# Patient Record
Sex: Male | Born: 1965 | Race: White | Hispanic: Yes | Marital: Married | Smoking: Never smoker
Health system: Southern US, Community
[De-identification: ages and names within clinical notes are randomized; demographics above are authoritative.]

## PROBLEM LIST (undated history)

## (undated) DIAGNOSIS — T8859XA Other complications of anesthesia, initial encounter: Secondary | ICD-10-CM

## (undated) DIAGNOSIS — E119 Type 2 diabetes mellitus without complications: Secondary | ICD-10-CM

## (undated) DIAGNOSIS — G839 Paralytic syndrome, unspecified: Secondary | ICD-10-CM

## (undated) DIAGNOSIS — F32A Depression, unspecified: Secondary | ICD-10-CM

## (undated) DIAGNOSIS — F419 Anxiety disorder, unspecified: Secondary | ICD-10-CM

## (undated) DIAGNOSIS — R0600 Dyspnea, unspecified: Secondary | ICD-10-CM

## (undated) DIAGNOSIS — Z87442 Personal history of urinary calculi: Secondary | ICD-10-CM

## (undated) DIAGNOSIS — R519 Headache, unspecified: Secondary | ICD-10-CM

## (undated) DIAGNOSIS — K219 Gastro-esophageal reflux disease without esophagitis: Secondary | ICD-10-CM

## (undated) DIAGNOSIS — Z8781 Personal history of (healed) traumatic fracture: Secondary | ICD-10-CM

## (undated) DIAGNOSIS — I10 Essential (primary) hypertension: Secondary | ICD-10-CM

## (undated) HISTORY — DX: Personal history of (healed) traumatic fracture: Z87.81

## (undated) HISTORY — PX: CERVICAL SPINE SURGERY: SHX589

---

## 1997-11-24 ENCOUNTER — Emergency Department (HOSPITAL_COMMUNITY): Admission: EM | Admit: 1997-11-24 | Discharge: 1997-11-24 | Payer: Self-pay | Admitting: Emergency Medicine

## 1997-11-29 ENCOUNTER — Emergency Department (HOSPITAL_COMMUNITY): Admission: EM | Admit: 1997-11-29 | Discharge: 1997-11-29 | Payer: Self-pay | Admitting: Emergency Medicine

## 1998-01-23 ENCOUNTER — Encounter: Payer: Self-pay | Admitting: *Deleted

## 1998-01-23 ENCOUNTER — Emergency Department (HOSPITAL_COMMUNITY): Admission: EM | Admit: 1998-01-23 | Discharge: 1998-01-23 | Payer: Self-pay | Admitting: *Deleted

## 1999-11-10 ENCOUNTER — Encounter: Payer: Self-pay | Admitting: Emergency Medicine

## 1999-11-10 ENCOUNTER — Inpatient Hospital Stay (HOSPITAL_COMMUNITY): Admission: EM | Admit: 1999-11-10 | Discharge: 1999-11-11 | Payer: Self-pay | Admitting: Emergency Medicine

## 2004-10-04 ENCOUNTER — Inpatient Hospital Stay (HOSPITAL_COMMUNITY): Admission: EM | Admit: 2004-10-04 | Discharge: 2004-10-08 | Payer: Self-pay | Admitting: Emergency Medicine

## 2005-01-25 ENCOUNTER — Emergency Department (HOSPITAL_COMMUNITY): Admission: EM | Admit: 2005-01-25 | Discharge: 2005-01-26 | Payer: Self-pay | Admitting: Emergency Medicine

## 2008-01-22 ENCOUNTER — Emergency Department (HOSPITAL_COMMUNITY): Admission: EM | Admit: 2008-01-22 | Discharge: 2008-01-22 | Payer: Self-pay | Admitting: Family Medicine

## 2009-01-08 ENCOUNTER — Emergency Department (HOSPITAL_COMMUNITY): Admission: EM | Admit: 2009-01-08 | Discharge: 2009-01-08 | Payer: Self-pay | Admitting: Emergency Medicine

## 2010-05-16 LAB — DIFFERENTIAL
Lymphocytes Relative: 18 % (ref 12–46)
Monocytes Absolute: 0.5 10*3/uL (ref 0.1–1.0)
Monocytes Relative: 7 % (ref 3–12)
Neutro Abs: 4.9 10*3/uL (ref 1.7–7.7)

## 2010-05-16 LAB — CBC
Hemoglobin: 15.8 g/dL (ref 13.0–17.0)
RBC: 5.1 MIL/uL (ref 4.22–5.81)

## 2010-05-16 LAB — BASIC METABOLIC PANEL
Calcium: 9.2 mg/dL (ref 8.4–10.5)
GFR calc Af Amer: >60 mL/min (ref >60)
GFR calc non Af Amer: >60 mL/min (ref >60)
Sodium: 139 mEq/L (ref 135–145)
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
## 2010-06-29 NOTE — Discharge Summary (Signed)
NAMEIZIC, STFORT NO.:  [id number]   MEDICAL RECORD NO.:  [id number]          PATIENT TYPE:  INP   LOCATION:  5702                         FACILITY:  [facility]   PHYSICIAN:  Cherylynn Ridges, M.D.    DATE OF BIRTH:  Sep 03, 1965   DATE OF ADMISSION:  10/04/2004  DATE OF DISCHARGE:                                 DISCHARGE SUMMARY   DISCHARGE DIAGNOSES:  1.  Fall.  2.  Grade 3 splenic graft laceration.  3.  Left renal contusion.  4.  Atelectasis.   CONSULTANTS:  None.   PROCEDURES:  None.   HISTORY OF PRESENT ILLNESS:  This is a 45 year old Hispanic male who fell  approximately 8 feet onto was left side while working in [employer]. He  comes in as a non-trauma code complaining mainly of left lateral  abdominal/flank pain but also complaining of some left foot shoulder and  shoulder pain as well as a right muscular neck pain. Patient's workup  included extremity films of the left shoulder and foot which were negative.  He also had a CT of the abdomen and pelvis which showed a grade 3 splenic  laceration and a small left renal contusion. He was admitted for strict  bedrest and observation.   HOSPITAL COURSE:  The patient did well in the hospital. He was on strict  bedrest for 3 days and then ambulated on the fourth and fifth days and did  that well. His hemoglobin trended upward which was a reassuring sign. He  continued to have adequate kidney function and so his was renal contusion  was not worrisome. He was felt able to go home in good condition on hospital  day #5.   DISCHARGE MEDICATIONS:  Vicodin 5/500 1-2 p.o. q.6 h p.r.n. pain,  #50 with  no refill.   FOLLOW UP:  The patient is to follow up with trauma service clinic on  September 5 at 9:30 in the morning.      Earney Hamburg, P.A.      Cherylynn Ridges, M.D.  Electronically Signed   MJ/MEDQ  D:  10/08/2004  T:  10/08/2004  Job:  [id number]   cc:   [facility]

## 2015-04-24 ENCOUNTER — Encounter (HOSPITAL_COMMUNITY): Payer: Self-pay | Admitting: Emergency Medicine

## 2015-04-24 ENCOUNTER — Emergency Department (HOSPITAL_COMMUNITY)
Admission: EM | Admit: 2015-04-24 | Discharge: 2015-04-24 | Disposition: A | Discharge status: Home / Self Care | Payer: Medicaid Other | Attending: Emergency Medicine | Admitting: Emergency Medicine

## 2015-04-24 ENCOUNTER — Emergency Department (HOSPITAL_COMMUNITY): Payer: Medicaid Other

## 2015-04-24 DIAGNOSIS — R110 Nausea: Secondary | ICD-10-CM | POA: Diagnosis not present

## 2015-04-24 DIAGNOSIS — E1165 Type 2 diabetes mellitus with hyperglycemia: Secondary | ICD-10-CM | POA: Insufficient documentation

## 2015-04-24 DIAGNOSIS — R42 Dizziness and giddiness: Secondary | ICD-10-CM | POA: Diagnosis present

## 2015-04-24 HISTORY — DX: Type 2 diabetes mellitus without complications: E11.9

## 2015-04-24 LAB — BASIC METABOLIC PANEL
Anion gap: 9 (ref 5–15)
BUN: 7 mg/dL (ref 6–20)
CALCIUM: 9 mg/dL (ref 8.9–10.3)
CHLORIDE: 107 mmol/L (ref 101–111)
CO2: 22 mmol/L (ref 22–32)
CREATININE: 0.74 mg/dL (ref 0.61–1.24)
GFR calc Af Amer: >60 mL/min (ref >60)
GFR calc non Af Amer: >60 mL/min (ref >60)
Glucose, Bld: 171 mg/dL — ABNORMAL HIGH (ref 65–99)
POTASSIUM: 3.7 mmol/L (ref 3.5–5.1)
SODIUM: 138 mmol/L (ref 135–145)

## 2015-04-24 LAB — CBC
HCT: 46.6 % (ref 39.0–52.0)
Hemoglobin: 15.7 g/dL (ref 13.0–17.0)
MCH: 30.1 pg (ref 26.0–34.0)
MCHC: 33.7 g/dL (ref 30.0–36.0)
MCV: 89.3 fL (ref 78.0–100.0)
PLATELETS: 155 10*3/uL (ref 150–400)
RBC: 5.22 MIL/uL (ref 4.22–5.81)
RDW: 12.5 % (ref 11.5–15.5)
WBC: 5.2 10*3/uL (ref 4.0–10.5)

## 2015-04-24 LAB — CBG MONITORING, ED: GLUCOSE-CAPILLARY: 117 mg/dL — AB (ref 65–99)

## 2015-04-24 MED ORDER — MECLIZINE HCL 25 MG PO TABS: 25.0000 mg | ORAL_TABLET | Freq: Three times a day (TID) | ORAL | Status: DC | PRN

## 2015-04-24 MED ORDER — LORAZEPAM 2 MG/ML IJ SOLN
1.0000 mg | Freq: Once | INTRAMUSCULAR | Status: AC
  Administered 2015-04-24: 1 mg via INTRAVENOUS
  Filled 2015-04-24: qty 1

## 2015-04-24 NOTE — ED Provider Notes (Addendum)
CSN: [id number]     Arrival date & time 04/24/15  0901 History   First MD Initiated Contact with Patient 04/24/15 1003     Chief Complaint  Patient presents with  . Dizziness     (Consider location/radiation/quality/duration/timing/severity/associated sxs/prior Treatment) HPI She speaks no [place]. History is obtained from language line on using [place] interpreter. Complains of dizziness i.e. sensation of room spinning onset yesterday. Denies pain anywhere. Patient had cough and chills yesterday which have resolved. Dizziness is worse with opening his eyes and moving his head improved with closing his eyes remaining still. Says his symptoms include nausea. No vomiting. No other associated symptoms no treatment prior to coming here. Past Medical History  Diagnosis Date  . Diabetes mellitus without complication (HCC)    History reviewed. No pertinent past surgical history. No family history on file. Social History  Substance Use Topics  . Smoking status: Never Smoker   . Smokeless tobacco: None  . Alcohol Use: Yes    Review of Systems  Skin: Positive for wound.       Draining wound on right buttock for several years previously surgerized 3 or 4 years ago at [facility]  Allergic/Immunologic: Positive for immunocompromised state.       Diabetic  Neurological: Positive for dizziness.  All other systems reviewed and are negative.     Allergies  Review of patient's allergies indicates no known allergies.  Home Medications   Prior to Admission medications   Not on File   BP 129/82 mmHg  Pulse 67  Temp(Src) 97.6 F (36.4 C) (Oral)  Resp 16  SpO2 99% Physical Exam  Constitutional: He is oriented to person, place, and time. He appears well-developed and well-nourished.  HENT:  Head: Normocephalic and atraumatic.  Eyes: Conjunctivae are normal. Pupils are equal, round, and reactive to light.  Neck: Neck supple. No tracheal deviation present. No thyromegaly present.   Cardiovascular: Normal rate and regular rhythm.   No murmur heard. Pulmonary/Chest: Effort normal and breath sounds normal.  Abdominal: Soft. Bowel sounds are normal. He exhibits no distension. There is no tenderness.  Musculoskeletal: Normal range of motion. He exhibits no edema or tenderness.  Neurological: He is alert and oriented to person, place, and time. He has normal reflexes. No cranial nerve deficit. Coordination normal.  Skin: Skin is warm and dry. No rash noted.  Right buttock with scar tissue dime sized area no drainage. No redness. No fluctuance  Psychiatric: He has a normal mood and affect.  Nursing note and vitals reviewed.   ED Course  Procedures (including critical care time) Labs Review Labs Reviewed  BASIC METABOLIC PANEL - Abnormal; Notable for the following:    Glucose, Bld 171 (*)    All other components within normal limits  CBC  URINALYSIS, ROUTINE W REFLEX MICROSCOPIC (NOT AT [facility])  CBG MONITORING, ED    Imaging Review No results found. I have personally reviewed and evaluated these images and lab results as part of my medical decision-making.   EKG Interpretation None      Results for orders placed or performed during the hospital encounter of 04/24/15  Basic metabolic panel  Result Value Ref Range   Sodium 138 135 - 145 mmol/L   Potassium 3.7 3.5 - 5.1 mmol/L   Chloride 107 101 - 111 mmol/L   CO2 22 22 - 32 mmol/L   Glucose, Bld 171 (H) 65 - 99 mg/dL   BUN 7 6 - 20 mg/dL   Creatinine, Ser  0.74 0.61 - 1.24 mg/dL   Calcium 9.0 8.9 - 16.110.3 mg/dL   GFR calc non Af Amer >60 >60 mL/min   GFR calc Af Amer >60 >60 mL/min   Anion gap 9 5 - 15  CBC  Result Value Ref Range   WBC 5.2 4.0 - 10.5 K/uL   RBC 5.22 4.22 - 5.81 MIL/uL   Hemoglobin 15.7 13.0 - 17.0 g/dL   HCT 09.646.6 04.539.0 - 40.952.0 %   MCV 89.3 78.0 - 100.0 fL   MCH 30.1 26.0 - 34.0 pg   MCHC 33.7 30.0 - 36.0 g/dL   RDW 81.112.5 91.411.5 - 78.215.5 %   Platelets 155 150 - 400 K/uL  CBG monitoring, ED   Result Value Ref Range   Glucose-Capillary 117 (H) 65 - 99 mg/dL   Mr Brain Wo Contrast  04/24/2015  CLINICAL DATA:  Dizziness and nausea beginning today. EXAM: MRI HEAD WITHOUT CONTRAST TECHNIQUE: Multiplanar, multiecho pulse sequences of the brain and surrounding structures were obtained without intravenous contrast. COMPARISON:  Head CT 01/25/2005 FINDINGS: The brain has a normal appearance on all pulse sequences without evidence of malformation, atrophy, old or acute infarction, mass lesion, hemorrhage, hydrocephalus or extra-axial collection. No pituitary mass. No fluid in the sinuses, middle ears or mastoids. No skull or skullbase lesion. There is flow in the major vessels at the base of the brain. Major venous sinuses show flow. IMPRESSION: Normal examination.  No cause of dizziness identified. Electronically Signed   By: Paulina FusiMark  Shogry M.D.   On: 04/24/2015 13:23    2:20 PM patient feels improved after treatment with intervenous Ativan. Vertigo is almost gone. He ambulates without difficulty. MDM  Vertigo felt to be peripheral in etiology plan prescription meclizine. 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

## 2015-04-24 NOTE — ED Notes (Signed)
EKG completed in triage.

## 2015-04-24 NOTE — ED Notes (Signed)
Doctor at bedside using language line.

## 2015-04-24 NOTE — Discharge Instructions (Signed)
Vrtigo posicional benigno (Benign Positional Vertigo) Call [facility] [place] [facility] scheduled office visit regarding the wound on your. Take the medication prescribed as needed for dizziness. See your primary care physician if dizziness continues in for 5 days. Return if your condition worsens for any reason El vrtigo es la sensacin de que usted o todo lo que lo rodea se mueven cuando en realidad eso no sucede. El vrtigo posicional benigno es el tipo de vrtigo ms comn. La causa de este trastorno no es grave (es benigna). Algunos movimientos y determinadas posiciones pueden desencadenar el trastorno (es posicional). El vrtigo posicional benigno puede ser peligroso si ocurre mientras est haciendo algo que podra suponer un riesgo para usted y para los dems, por ejemplo, conduciendo un automvil.  CAUSAS En muchos de los [place], se desconoce la causa de este trastorno. Puede deberse a Hotel manager zona del odo interno que ayuda al cerebro a percibir el movimiento y a Administrator, Civil Service equilibrio. Esta alteracin puede deberse a una infeccin viral (laberintitis), a una lesin en la cabeza o a los movimientos reiterados. FACTORES DE RIESGO Es ms probable que esta afeccin se manifieste en:  Las mujeres.  Las [employer] de [id number]. SNTOMAS Generalmente, los sntomas de este trastorno se presentan al mover la cabeza o los ojos en diferentes direcciones. Pueden aparecer repentinamente y suelen durar menos de un minuto. Entre los sntomas se pueden incluir los siguientes:  Prdida del equilibrio y cadas.  Sensacin de estar dando vueltas o movindose.  Sensacin de que el entorno est dando vueltas o movindose.  Nuseas y vmitos.  Visin borrosa.  Mareos.  Movimientos oculares involuntarios (nistagmo). Los sntomas pueden ser leves y algo fastidiosos, o pueden ser graves e interferir en la vida cotidiana. Los episodios de vrtigo posicional benigno pueden repetirse (ser  recurrentes) a lo largo del [place], y algunos movimientos pueden desencadenarlos. Los sntomas pueden mejorar con Museum/gallery conservator. DIAGNSTICO Generalmente, este trastorno se diagnostica con una historia clnica y un examen fsico de la cabeza, el cuello y los odos. Tal vez lo deriven a Catering manager en problemas de la garganta, la nariz y el odo (otorrinolaringlogo), o a uno que se especializa en trastornos del sistema nervioso (neurlogo). Pueden hacerle otros estudios, entre ellos:  Health visitor.  Tomografa computarizada.  Estudios de los [employer]. El mdico puede pedirle que cambie rpidamente de posicin mientras observa si se presentan sntomas de vrtigo posicional benigno, por ejemplo, nistagmo. Los movimientos oculares se pueden estudiar con una electronistagmografa (ENG), con estimulacin trmica, mediante la maniobra de [place] o con la prueba de rotacin.  Electroencefalograma (EEG). Este estudio registra la actividad elctrica del cerebro.  Pruebas de audicin. Lissa Morales, para tratar este trastorno, el mdico le har movimientos especficos con la cabeza para que el odo interno se normalice. [employer] casos son graves, tal vez haya que realizar una ciruga, pero esto no es frecuente. En algunos casos, el vrtigo posicional benigno se resuelve por s solo en el trmino de 2 o 4semanas. INSTRUCCIONES PARA EL CUIDADO EN EL HOGAR Seguridad  Muvase lentamente.No haga movimientos bruscos con el cuerpo o con la cabeza.  No conduzca.  No opere maquinaria pesada.  No haga ninguna tarea que podra ser peligrosa para usted o para Economist en caso de que ocurriera un episodio de vrtigo.  Si tiene dificultad para caminar o mantener el equilibrio, use un bastn para Photographer estabilidad. Si se siente mareado o inestable, sintese de inmediato.  Reanude sus actividades normales como se lo haya indicado el mdico. Pregntele al  mdico qu actividades son seguras para usted. Instrucciones generales  Baxter Internationalome los medicamentos de venta libre y los recetados solamente como se lo haya indicado el mdico.  Evite algunas posiciones o determinados movimientos como se lo haya indicado el mdico.  Beba suficiente lquido para Pharmacologistmantener la orina clara o de color amarillo plido.  Concurra a todas las visitas de control como se lo haya indicado el mdico. Esto es importante. SOLICITE ATENCIN MDICA SI:  Lance Mussiene fiebre.  El trastorno Greeneempeora, o le aparecen sntomas nuevos.  Sus familiares o amigos advierten cambios en su comportamiento.  Las nuseas o los vmitos empeoran.  Tiene sensacin de hormigueo o de adormecimiento. SOLICITE ATENCIN MDICA DE INMEDIATO SI:  Tiene dificultad para hablar o para moverse.  Esta mareado todo Allied Waste Industriesel tiempo.  Se desmaya.  Tiene dolores de cabeza intensos.  Tiene debilidad en los brazos o las piernas.  Tiene cambios en la audicin o la visin.  Siente rigidez en el cuello.  Tiene sensibilidad a Statisticianla luz.   Esta informacin no tiene Theme park managercomo fin reemplazar el consejo del mdico. Asegrese de hacerle al mdico cualquier pregunta que tenga.   Document Released: 05/16/2008 Document Revised: 10/19/2014 Elsevier Interactive Patient Education [employer]2016 Elsevier Inc.

## 2015-04-24 NOTE — ED Notes (Signed)
Pt and wife verbalize understanding of instructions.

## 2015-04-24 NOTE — ED Notes (Signed)
Used language line to communicate with patient.  

## 2015-04-24 NOTE — ED Notes (Signed)
Translator phone used...the patient c/o not feeling well yesterday, pt woke up today feeling dizzy and having nausea.

## 2017-11-17 ENCOUNTER — Ambulatory Visit: Payer: Self-pay | Attending: Family Medicine | Admitting: Podiatry

## 2017-11-17 DIAGNOSIS — B351 Tinea unguium: Secondary | ICD-10-CM

## 2017-11-17 MED ORDER — FLUCONAZOLE 150 MG PO TABS: 150.0000 mg | ORAL_TABLET | ORAL | 1 refills | Status: DC

## 2017-11-17 NOTE — Progress Notes (Signed)
  Subjective:  Patient ID: [name], male    DOB: 04-Apr-1965,  MRN: [id number]  No chief complaint on file.   52 y.o. male presents with the above complaint. DM but doesn't check his sugars. Reports thickening and pain to the toenails of both feet.   Review of Systems: Negative except as noted in the HPI. Denies N/V/F/Ch.  Past Medical History:  Diagnosis Date  . Diabetes mellitus without complication (HCC)     Current Outpatient Medications:  .  fluconazole (DIFLUCAN) 150 MG tablet, Take 1 tablet (150 mg total) by mouth once a week., Disp: 6 tablet, Rfl: 1 .  gemfibrozil (LOPID) 600 MG tablet, Take 600 mg by mouth 2 (two) times daily., Disp: , Rfl: 1 .  ibuprofen (ADVIL,MOTRIN) 200 MG tablet, Take 200 mg by mouth every 6 (six) hours as needed for moderate pain., Disp: , Rfl:  .  meclizine (ANTIVERT) 25 MG tablet, Take 1 tablet (25 mg total) by mouth 3 (three) times daily as needed for dizziness., Disp: 20 tablet, Rfl: 0 .  metFORMIN (GLUCOPHAGE-XR) 500 MG 24 hr tablet, Take 1,000 mg by mouth every evening. , Disp: , Rfl: 0  Social History   Tobacco Use  Smoking Status Never Smoker    No Known Allergies Objective:  There were no vitals filed for this visit. There is no height or weight on file to calculate BMI. Constitutional Well developed. Well nourished.  Vascular Dorsalis pedis pulses palpable bilaterally. Posterior tibial pulses palpable bilaterally. Capillary refill normal to all digits.  No cyanosis or clubbing noted. Pedal hair growth normal.  Neurologic Normal speech. Oriented to person, place, and time. Epicritic sensation to light touch grossly present bilaterally.  Dermatologic Nails thickened and dystrophic. No open wounds. No skin lesions.  Orthopedic: Normal joint ROM without pain or crepitus bilaterally. No visible deformities. No bony tenderness.   Radiographs: None Assessment:   1. Onychomycosis    Plan:  Patient was evaluated and  treated and all questions answered.  Onychomycosis -Educated on etiology of nail fungus. -Rx fluconazole weekly. Educated on use.   Return if symptoms worsen or fail to improve.

## 2018-01-09 ENCOUNTER — Other Ambulatory Visit: Payer: Self-pay | Admitting: Podiatry

## 2018-10-08 ENCOUNTER — Ambulatory Visit: Payer: Medicaid Other | Admitting: Occupational Therapy

## 2018-10-08 ENCOUNTER — Other Ambulatory Visit: Payer: Self-pay

## 2018-10-08 ENCOUNTER — Ambulatory Visit: Payer: Medicaid Other | Attending: Physical Medicine and Rehabilitation | Admitting: Physical Therapy

## 2018-10-08 VITALS — BP 109/64 | HR 63

## 2018-10-08 DIAGNOSIS — G8221 Paraplegia, complete: Secondary | ICD-10-CM | POA: Diagnosis not present

## 2018-10-08 DIAGNOSIS — R208 Other disturbances of skin sensation: Secondary | ICD-10-CM | POA: Insufficient documentation

## 2018-10-08 DIAGNOSIS — M6281 Muscle weakness (generalized): Secondary | ICD-10-CM | POA: Diagnosis present

## 2018-10-08 DIAGNOSIS — M542 Cervicalgia: Secondary | ICD-10-CM | POA: Diagnosis present

## 2018-10-08 DIAGNOSIS — R29818 Other symptoms and signs involving the nervous system: Secondary | ICD-10-CM | POA: Diagnosis present

## 2018-10-08 DIAGNOSIS — R293 Abnormal posture: Secondary | ICD-10-CM | POA: Insufficient documentation

## 2018-10-08 DIAGNOSIS — G8222 Paraplegia, incomplete: Secondary | ICD-10-CM | POA: Insufficient documentation

## 2018-10-08 NOTE — Therapy (Signed)
[facility] [address] [place], [place], [address] Phone: [phone]   Fax:  [phone]  Physical Therapy Evaluation  Patient Details  Name: [name] MRN: [id number] Date of Birth: 01/25/1966 Referring Provider (PT): Harry CrampPatrick O'Brien, MD   Encounter Date: 10/08/2018  PT End of Session - 10/08/18 1248    Visit Number  1    Number of Visits  4   eval + [facility]   Authorization Type  Medicaid - pt has OT eval pending for September 17th    PT Start Time  0845    PT Stop Time  0945    PT Time Calculation (min)  60 min    Activity Tolerance  Patient tolerated treatment well    Behavior During Therapy  [facility] for tasks assessed/performed       Past Medical History:  Diagnosis Date  . Diabetes mellitus without complication (HCC)     No past surgical history on file.  Vitals:   10/08/18 0924  BP: 109/64  Pulse: 63     Subjective Assessment - 10/08/18 0856    Subjective  Pt is a 53 year old male with history of T2DM who [facility] as a trauma 1 via EMS s/p fall from elevated surface. 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 No LOC. Patient immediately noted back pain and bilateral lower extremity paralysis. He was found to have C1 burst fx with [place] transection s/p C4-T6 spinal fusion (6/10), incomplete para deficits. [facility]. At inpatient rehab they were working on his sitting balance, bicycle exercises, and working so my wife can get me in and out of bed. Pt's wife uses lift to get pt from w/c <> bed. Pt has hospital bed at home. [place] time [place] in [place] he was able to feel his wife touch at the bottom of his foot when putting socks on, but that was only once. Currently using a loaner chair - through [employer], not sure if his final w/c will have a seat  elevator.    Patient is accompained by:  Family member;Interpreter   virtual interpreter, wife [name]   Pertinent History  C1 burst fx with [place] transection s/p C4-T6 spinal fusion (6/10), incomplete para deficits, previous episodes of autonomic dysreflexia in inpatient rehab    Patient Stated Goals  wants to do exercises as soon as he can, wants to be able to feel his legs again if he can.    Currently in Pain?  Yes    Pain Score  7    when taking pain meds, it is a 5, if he doesn't its a 7, worse when moving   Pain Location  Shoulder    Pain Orientation  Right         [facility] PT Assessment - 10/08/18 0905      Assessment   Medical Diagnosis  SCI    Referring Provider (PT)  Harry CrampPatrick O'Brien, MD    Onset Date/Surgical Date  07/22/18    Prior Therapy  PT, OT, SLP [facility]      Precautions   Precautions  Fall    Precaution Comments  Has an abdominal binder at home for BP management, has not been wearing it due to fear of pressure sores. Figure 8 wraps for BP when OOB. autonomic dysreflexia (previous  episodes at inpatient rehab in [facility]).       Restrictions   Other Position/Activity Restrictions  WBAT R elbow-  R Velcro wrist splint.  aspen collar D/C at [facility]      Home Nurse, mental healthnvironment   Living Environment  Private residence    Living Arrangements  Spouse/significant other   and 4 children    Available Help at Discharge  Family   only his wife and 2 other kids   Type of Home  House    Home Access  Ramped entrance    Home Layout  One level    Home Equipment  Hospital bed   power w/c, lift for transfers   Additional Comments  Had to remodel home and bathoom, hospital was going to provide a roll-in shower chair, was waiting for a donation - currently getting bathed by wife in bed       Prior Function   Level of Independence  Independent    Vocation  On disability    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      Cognition   Overall Cognitive Status  Within  Functional Limits for tasks assessed      Observation/Other Assessments   Observations  Pt states that he is independent in performing pressure relief every 30 minutes for at least 2 minutes - using tilt in space power w/c      Sensation   Light Touch  Impaired Detail    Light Touch Impaired Details  Absent RLE;Absent LLE    Proprioception  Impaired Detail    Proprioception Impaired Details  Absent RLE;Absent LLE   ankle DF   Additional Comments  absent below T1 dermatome to light touch ([facility]) - only LE tested today      Coordination   Gross Motor Movements are Fluid and Coordinated  --    Fine Motor Movements are Fluid and Coordinated  --      Posture/Postural Control   Posture/Postural Control  Postural limitations    Postural Limitations  Flexed trunk;Posterior pelvic tilt;Forward head;Rounded Shoulders      ROM / Strength   AROM / PROM / Strength  Strength      Strength   Overall Strength Comments  no active movement noted BLEs, 0/5      Palpation   Palpation comment  TTP R > L shoulder musculature, anterior chest, accesssory muscles for breathing      Bed Mobility   Bed Mobility  Not assessed   due to time contraints     Transfers   Transfers  Lateral/Scoot Transfers    Lateral/Scoot Transfers  1: +2 Total assist    Lateral/Scoot Transfer Details (indicate cue type and reason)  with sliding board from w/c <. tx mat table, (surface elevated for gravity to assist), cueing for technique, sequencing, and shifting COG anteriorly.       Balance   Balance Assessed  Yes      Static Sitting Balance   Static Sitting - Balance Support  Feet supported   trunk supported, max A from therapist posteriorly   Static Sitting - Level of Assistance  2: Max assist      Dynamic Sitting Balance   Dynamic Sitting - Balance Support  Feet supported    Dynamic Sitting - Level of Assistance  2: Max assist    Dynamic Sitting - Balance Activities   Forward lean/weight shifting    Dynamic Sitting balance - Comments  In short sitting with therapist posterior, pt practicing anterior weight shifting with LUE (posteriorly with shoulder extension, ER), RUE on wedge on R with pillow d/t weight bearing precautions, cueing for shifting weight A/P for using head to assist. With therapist posterior to pt, providing cueing for diaphragmatic breathing and max A for upright trunk to stretch anterior chest and facilitate upright posture for increased ease of breathing and decrease use of accessory muscles for breathing - x5 reps.  Taught wife and pt how to perform at home. Total time in static/dynamic short sitting: approx. 10 minutes.                 Objective measurements completed on examination: See above findings.              PT Education - 10/08/18 1247    Education Details  POC, clinical findings, educated spouse on diaphragmatic breathing technique (sitting posteriorly behind pt with hands on chest, facilitating upright posture)    Person(s) Educated  Patient;Spouse    Methods  Explanation;Demonstration    Comprehension  Verbalized understanding       PT Short Term Goals - 10/08/18 1525      PT SHORT TERM GOAL #1   Title  Patient will direct initial HEP independently to wife/caregivers for stretching LEs in order to prevent risk of contractures. ALL STGS DUE BY 3RD VISIT    Baseline  dependent    Time  3    Period  --   visits   Status  New      PT SHORT TERM GOAL #2   Title  Patient will perform sitting balance with A/P weight shifting with mod A in order to prepare for sliding board lateral transfers from mat <> w/c.    Baseline  max A needed posteriorly in order for pt to remain balanced in short sitting with feet supported on block.    Status  New      PT SHORT TERM GOAL #3   Title  Patient will perform rolling right and left in bed with mod A in order to decrease caregiver burden and for pressure relief.     Baseline  not yet assessed    Status  New      PT SHORT TERM GOAL #4   Title  Patient will perform scoot pivot t/f on sliding board from w/c <> mat table (from one surface elevated with gravity assisting) with max A.    Baseline  total A x2 on 10/08/18    Status  New        PT Long Term Goals - 10/08/18 1531      PT LONG TERM GOAL #1   Title  Patient will direct final HEP independently to wife/caregivers for stretching LEs in order to prevent risk of contractures. ALL LTGS DUE BY 12TH VISIT    Baseline  dependent    Time  12    Period  --   visits   Status  New      PT LONG TERM GOAL #2   Title  When pt receives his own w/c (vs. loaner chair) pt will demonstrate independence with w/c parts in order to safely prepare for transfers.    Baseline  not yet assessed    Status  New      PT LONG TERM GOAL #3   Title  Patient will perform sitting balance with A/P and M/L weight shifting with supervision/min A in order to perform sliding board  lateral transfers from mat <> w/c.    Baseline  currently max A for dynamic sitting balance    Status  New      PT LONG TERM GOAL #4   Title  Patient will perform rolling right and left in bed with min A in order to decrease caregiver burden and for pressure relief.    Baseline  not yet assessed    Status  New      PT LONG TERM GOAL #5   Title  Patient will perform scoot pivot t/f on sliding board from w/c <> mat table on level surface with min-mod A.    Baseline  total A x2 with gravity assisting transfer    Status  New             Plan - 10/08/18 1249    Clinical Impression Statement  Patient is a 53 year old male referred to Neuro OPPT for evaluation s/p  s/p fall from elevated surface, resulting in C1 burst fx with [place] transection s/p C4-T6 spinal fusion (6/10), incomplete para deficits.  Pt's PMH is significant for: T2DM.  The following deficits were present during the exam: decreased static/dynamic sitting balance and trunk control,  decreased ROM, impaired sensation, impaired strength, requiring total A for sliding board transfers. Pt would benefit from skilled PT to address these impairments and functional limitations to maximize functional mobility independence.    Personal Factors and Comorbidities  Past/Current Experience;Profession;Comorbidity 1;Finances    Comorbidities  T2DM    Examination-Activity Limitations  Bathing;Bed Mobility;Stand;Sit;Transfers    Examination-Participation Restrictions  Community Activity;Driving    Stability/Clinical Decision Making  Unstable/Unpredictable    Clinical Decision Making  High    Rehab Potential  Good    PT Frequency  1x / week    PT Duration  3 weeks   initially for medicaid   PT Treatment/Interventions  ADLs/Self Care Home Management;DME Instruction;Functional mobility training;Therapeutic activities;Therapeutic exercise;Balance training;Neuromuscular re-education;Wheelchair mobility training;Patient/family education;Passive range of motion;Energy conservation;Electrical Stimulation;Orthotic Fit/Training;Manual techniques    PT Next Visit Plan  check neck ROM, assess bed mobility when able, follow up with a physiatrist here in town? (Dr. Tessa Lerner). Sitting balance A/P and M/L with BLE on step, teaching wife initial HEP for home stretching program to decrease risk of contractures.    Consulted and Agree with Plan of Care  Patient;Family member/caregiver    Family Member Consulted  wife, [name]       Patient will benefit from skilled therapeutic intervention in order to improve the following deficits and impairments:  Decreased activity tolerance, Decreased balance, Decreased mobility, Decreased endurance, Decreased range of motion, Decreased strength, Impaired flexibility, Impaired sensation, Impaired UE functional use, Pain, Impaired tone, Postural dysfunction  Visit Diagnosis: Other symptoms and signs involving the nervous system  Other disturbances of skin  sensation  Muscle weakness (generalized)  Paraplegia, incomplete (HCC)  Abnormal posture  Cervicalgia     Problem List There are no active problems to display for this patient.   Arliss Journey, PT, DPT  10/09/2018, 11:39 AM  [place] [address] [place], [place], [address] Phone: [phone]   Fax:  [phone]  Name: [name] MRN: [id number] Date of Birth: 1965/10/22

## 2018-10-13 ENCOUNTER — Ambulatory Visit (INDEPENDENT_AMBULATORY_CARE_PROVIDER_SITE_OTHER): Payer: Medicaid Other | Admitting: Primary Care

## 2018-10-13 ENCOUNTER — Other Ambulatory Visit: Payer: Self-pay

## 2018-10-13 ENCOUNTER — Encounter (INDEPENDENT_AMBULATORY_CARE_PROVIDER_SITE_OTHER): Payer: Self-pay | Admitting: Primary Care

## 2018-10-13 VITALS — BP 98/63 | HR 67 | Temp 97.8°F

## 2018-10-13 DIAGNOSIS — F329 Major depressive disorder, single episode, unspecified: Secondary | ICD-10-CM

## 2018-10-13 DIAGNOSIS — S14109S Unspecified injury at unspecified level of cervical spinal cord, sequela: Secondary | ICD-10-CM | POA: Diagnosis not present

## 2018-10-13 DIAGNOSIS — S129XXS Fracture of neck, unspecified, sequela: Secondary | ICD-10-CM

## 2018-10-13 DIAGNOSIS — E0849 Diabetes mellitus due to underlying condition with other diabetic neurological complication: Secondary | ICD-10-CM | POA: Diagnosis not present

## 2018-10-13 DIAGNOSIS — E119 Type 2 diabetes mellitus without complications: Secondary | ICD-10-CM

## 2018-10-13 DIAGNOSIS — W1789XS Other fall from one level to another, sequela: Secondary | ICD-10-CM

## 2018-10-13 DIAGNOSIS — F32A Depression, unspecified: Secondary | ICD-10-CM

## 2018-10-13 MED ORDER — BACLOFEN 5 MG PO TABS: 10.0000 mg | ORAL_TABLET | Freq: Three times a day (TID) | ORAL | 0 refills | Status: DC | PRN

## 2018-10-13 MED ORDER — GABAPENTIN 400 MG PO CAPS: 400.0000 mg | ORAL_CAPSULE | Freq: Three times a day (TID) | ORAL | 3 refills | Status: DC

## 2018-10-13 MED ORDER — FLUOXETINE HCL 10 MG PO CAPS: 10.0000 mg | ORAL_CAPSULE | Freq: Every day | ORAL | 3 refills | Status: DC

## 2018-10-13 NOTE — Progress Notes (Signed)
New Patient Office Visit  Subjective:  Patient ID: [name], male    DOB: 05/17/1965  Age: 53 y.o. MRN: [id number]  CC:  Chief Complaint  Patient presents with  . New Patient (Initial Visit)    HPI [name] presents for establishment of care and hospital discharge. June 2020  he was working in Orthoptist a roof and fall of and Orthopedic surgery at [facility] Dr. Ailene Rud, Elmon Else C6-T2 fx wiht SCI, t4, 5, 7, 8, 12 compression fx, L 1-2 and R 2-3 rib fx, R costomanubrial articulation fracture. Primary care giver is his wife. Discussed caregiver burn out and will try to have assistance in the home to help coordinate care Past Medical History:  Diagnosis Date  . Diabetes mellitus without complication ([name])     No past surgical history on file.  No family history on file.  Social History   Socioeconomic History  . Marital status: Married    Spouse name: Not on file  . Number of children: Not on file  . Years of education: Not on file  . Highest education level: Not on file  Occupational History  . Not on file  Social Needs  . Financial resource strain: Not on file  . Food insecurity    Worry: Not on file    Inability: Not on file  . Transportation needs    Medical: Not on file    Non-medical: Not on file  Tobacco Use  . Smoking status: Never Smoker  . Smokeless tobacco: Never Used  Substance and Sexual Activity  . Alcohol use: Yes  . Drug use: No  . Sexual activity: Not on file  Lifestyle  . Physical activity    Days per week: Not on file    Minutes per session: Not on file  . Stress: Not on file  Relationships  . Social Herbalist on phone: Not on file    Gets together: Not on file    Attends religious service: Not on file    Active member of club or organization: Not on file    Attends meetings of clubs or organizations: Not on file    Relationship status: Not on file  . Intimate partner violence    Fear of current or  ex partner: Not on file    Emotionally abused: Not on file    Physically abused: Not on file    Forced sexual activity: Not on file  Other Topics Concern  . Not on file  Social History Narrative  . Not on file    ROS Review of Systems  Constitutional: Positive for activity change.       Due to fall limited use of fingers and arms. He can feed his self.  Musculoskeletal: Positive for back pain and gait problem.  Neurological: Positive for weakness and numbness.  All other systems reviewed and are negative.   Objective:   Today's Vitals: BP 98/63 (BP Location: Left Arm, Patient Position: Sitting, Cuff Size: Large)   Pulse 67   Temp 97.8 F (36.6 C) (Tympanic)   SpO2 96%   Physical Exam Vitals signs reviewed.  Constitutional:      General: He is in acute distress.     Appearance: Normal appearance. He is obese.  HENT:     Head: Normocephalic.     Right Ear: Tympanic membrane normal.     Left Ear: Tympanic membrane normal.  Neck:     Musculoskeletal: Normal range of motion  and neck supple.  Cardiovascular:     Rate and Rhythm: Regular rhythm. Bradycardia present.     Pulses: Normal pulses.     Heart sounds: Normal heart sounds.  Pulmonary:     Effort: Pulmonary effort is normal.     Breath sounds: Normal breath sounds.  Abdominal:     General: Bowel sounds are normal. There is distension.     Palpations: Abdomen is soft.  Musculoskeletal:     Comments: Non ambulatory   Skin:    General: Skin is warm and dry.  Neurological:     Mental Status: He is alert and oriented to person, place, and time.  Psychiatric:        Mood and Affect: Mood normal.        Behavior: Behavior normal.     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 Baclofen 5 MG TABS Take 10 mg by mouth 3 (three) times daily as needed.  . bisacodyl (DULCOLAX) 10 MG suppository Place rectally.  . diclofenac sodium (VOLTAREN) 1 % GEL Apply topically.  Marland Kitchen FLUoxetine (PROZAC) 10 MG capsule Take 1 capsule (10 mg total) by mouth daily.  Marland Kitchen gabapentin (NEURONTIN) 400 MG capsule Take 1 capsule (400 mg total) by mouth 3 (three) times daily.  Marland Kitchen senna (SENOKOT) 8.6 MG tablet Take 8.6 mg by mouth.  . simethicone (MYLICON) 80 MG chewable tablet Chew by mouth.  . [DISCONTINUED] Baclofen 5 MG TABS Take by mouth.  . [DISCONTINUED] FLUoxetine (PROZAC) 10 MG capsule Take 10 mg by mouth daily.  . [DISCONTINUED] gabapentin (NEURONTIN) 400 MG capsule Take 400 mg by mouth 2 (two) times daily.  . [DISCONTINUED] fluconazole (DIFLUCAN) 150 MG tablet Take 1 tablet (150 mg total) by mouth once a week.  . [DISCONTINUED] gemfibrozil (LOPID) 600 MG tablet Take 600 mg by mouth 2 (two) times daily.  . [DISCONTINUED] ibuprofen (ADVIL,MOTRIN) 200 MG tablet Take 200 mg by mouth every 6 (six) hours as needed for moderate pain.  . [DISCONTINUED] meclizine (ANTIVERT) 25 MG tablet Take 1 tablet (25 mg total) by mouth 3 (three) times daily as needed for dizziness.  . [DISCONTINUED] metFORMIN (GLUCOPHAGE-XR) 500 MG 24 hr tablet Take 1,000 mg by mouth every evening.    No facility-administered encounter medications on file as of 10/13/2018.    Diagnoses and all orders for this visit: [name] was seen today for new patient (initial visit).  Type 2 diabetes mellitus without complication, without long-term current use of insulin (HCC) ADA recommends the following therapeutic goals for glycemic control related to A1c  measurements: Goal of therapy: Less than 6.5 hemoglobin A1c.  Reference clinical practice recommendations. Foods that are high in carbohydrates are the following rice, potatoes, breads, sugars, and pastas.  Reduction in the intake (eating) will assist in lowering your blood sugars. -  HgB A1c  Depression, unspecified depression type Depression is when you feel down, blue or sad for at least 2 weeks in a row. You may experience increaset increase in sleeping, eating or  loss of interest in doing things that once gave you pleasure. Feeling worthless, guilty, nervous and low self esteem, avoiding interaction with other people or increase agitation.  Diabetes due to undrl condition w oth diabetic neuro comp (HCC) Diabetic neuropathy is a type of nerve damage that can occur if you have diabetes. High blood sugar (glucose) can injure nerves throughout your body. Diabetic neuropathy most often damages nerves in your legs and feet. Another contributing actor is phantom pain from spinal cord injury.  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 [facility]   Other orders -     Baclofen 5 MG TABS; Take 10 mg by mouth 3 (three) times daily as needed. -     FLUoxetine (PROZAC) 10 MG capsule; Take 1 capsule (10 mg total) by mouth daily. -     gabapentin (NEURONTIN) 400 MG capsule; Take 1 capsule (400 mg total) by mouth 3 (three) times daily.  Follow-up: No follow-ups on file.   Grayce SessionsMichelle P Edwards, NP

## 2018-10-13 NOTE — Progress Notes (Signed)
Right arm/shoulder pain.

## 2018-10-13 NOTE — Patient Instructions (Signed)
Fractura de la columna lumbar Lumbar Spine Fracture Una fractura de la columna lumbar es la quebradura de uno de los huesos de la parte baja de la espalda. Las fracturas de la columna lumbar pueden ser de leves a graves. Los tipos ms graves son aquellos que:  Causan que los huesos fracturados se desplacen de su lugar (estn inestables).  Lesionan la mdula espinal o ejercen presin sobre ella. Durante la recuperacin, es normal sentir dolor y Best boy rigidez en la parte baja de la espalda durante semanas. Cules son las causas? Esta afeccin puede ser causada por lo siguiente:  Ardelia Mems cada.  Un accidente automovilstico.  Ardelia Mems herida por arma de fuego.  Un golpe fuerte y directo en la espalda. Qu incrementa el riesgo? Es ms probable que usted sufra esta afeccin si:  Est en una situacin que podra tener como resultado una cada u otra lesin violenta.  Tiene una afeccin que causa debilidad en los huesos (osteoporosis). Cules son los signos o sntomas? El sntoma principal de esta afeccin es el dolor intenso en la parte baja de la espalda. Si la fractura es compleja o grave, tambin puede presentarse lo siguiente:  Una zona deforme o hinchada en la parte baja de la espalda.  Limitaciones en la capacidad de mover una zona de la parte baja de la espalda.  Imposibilidad de vaciar la vejiga (retencin urinaria).  Prdida del control de la vejiga o los intestinos (incontinencia).  Prdida de la fuerza o la sensibilidad en las piernas, los pies y los dedos de los pies.  Incapacidad para moverse (parlisis). Cmo se diagnostica? Esta afeccin se diagnostica en funcin de lo siguiente:  Un examen fsico.  Los sntomas y lo que ocurri antes de que Merchandiser, retail.  Los [employer] estudios de diagnstico por imgenes, como radiografa, exploracin por tomografa computarizada (TC) o resonancia magntica (RM). Si los nervios estn daados, es posible que tambin le  hagan otros estudios para Teacher, adult education la extensin del dao. Cmo se trata? El tratamiento de esta afeccin depende de la gravedad de la lesin. La mayora de las fracturas pueden tratarse con los siguientes mtodos:  Un dispositivo ortopdico para la espalda.  Reposo en cama y restricciones en las actividades.  Analgsicos.  Fisioterapia. Las fracturas que son complejas, involucran varios huesos o causan inestabilidad en la columna pueden requerir [place]. La ciruga se realiza con los siguientes fines:  Radiographer, therapeutic la presin [employer] nervios o la mdula espinal.  Estabilizar los fragmentos de hueso fracturado. Siga estas indicaciones en su casa: Medicamentos  [employer] de venta libre y los recetados solamente como se lo haya indicado el mdico.  No conduzca ni use maquinaria pesada mientras toma analgsicos recetados.  Si toma analgsicos recetados, adopte medidas para prevenir o tratar el estreimiento. El mdico podra recomendarle que haga lo siguiente: ? Beber suficiente lquido como para mantener la orina de color amarillo plido. ? Consumir alimentos ricos en fibra, como frutas y verduras frescas, cereales integrales y frijoles. ? Limitar el consumo de alimentos ricos en grasa y azcares procesados, como los alimentos fritos o dulces. ? Tomar un medicamento recetado o de venta libre para el estreimiento. Si tiene un dispositivo ortopdico:  Use el dispositivo ortopdico para Cytogeneticist se lo haya indicado el mdico. Quteselo solamente como se lo haya indicado el mdico.  Mantenga limpio el dispositivo ortopdico.  Si el dispositivo ortopdico no es impermeable: ? No deje que se moje. ? Cbralo con un envoltorio  hermtico cuando tome un bao de inmersin o [place]. Actividad  Haga reposo en cama solo como se lo haya indicado el mdico.  Haga ejercicios para mejorar el movimiento y Software engineer la espalda (fisioterapia), si el mdico le indica que  lo haga.  Retome sus actividades normales como se lo haya indicado el mdico. Pregntele al mdico qu actividades son seguras para usted. Control del dolor, de la rigidez y de la hinchazn   Si se lo indican, aplique hielo sobre la zona de la lesin: ? Si tiene un dispositivo ortopdico desmontable, quteselo segn las indicaciones del mdico. ? Ponga el hielo en una bolsa plstica. ? Coloque una [employer] piel y la bolsa de hielo. ? Coloque el hielo durante 70minutos, 2 a 3veces por da. Indicaciones generales  No consuma ningn producto que contenga nicotina o tabaco, como cigarrillos y Psychologist, sport and exercise. Estos pueden retrasar la cicatrizacin despus de una lesin. Si necesita ayuda para dejar de fumar, consulte al mdico.  No beba alcohol. El alcohol puede interferir en el tratamiento.  Concurra a todas las visitas de control como se lo haya indicado el mdico. Esto es importante. ? Si no concurre a las visitas de control segn lo recomendado, podra tener una lesin [place]rill, discapacidad o dolor de larga duracin (crnico). Comunquese con un mdico si:  Tiene fiebre.  El medicamento no Production designer, theatre/television/film.  El dolor no mejora con el [place].  No puede retomar las actividades normales segn lo planeado o lo esperado. Solicite ayuda de inmediato si:  Tiene dificultad para respirar.  El dolor es muy intenso y empeora repentinamente.  Siente adormecimiento, hormigueo o debilidad en alguna parte del cuerpo.  No puede vaciar la vejiga.  No puede controlar los intestinos o la vejiga.  No puede mover alguna parte del cuerpo (parlisis) por debajo del nivel de la lesin.  Vomita.  Tiene dolor en el abdomen. Resumen  Una fractura de la columna lumbar es la quebradura de uno de los huesos de la parte baja de la espalda.  El sntoma principal de esta afeccin es el dolor intenso en la parte baja de la espalda. Si la fractura es compleja, tambin puede  presentarse entumecimiento, hormigueo o parlisis en las piernas.  El tratamiento depende de la gravedad de la lesin. La mayora de las fracturas pueden tratarse con un dispositivo ortopdico para la espalda, reposo en cama, restriccin de las actividades, analgsicos y fisioterapia.  Las fracturas que son complejas, involucran varios huesos o causan inestabilidad en la columna pueden requerir [place]. Esta informacin no tiene Marine scientist el consejo del mdico. Asegrese de hacerle al mdico cualquier pregunta que tenga. Document Released: 01/11/2008 Document Revised: 05/12/2017 Document Reviewed: 05/12/2017 Elsevier Patient Education  2020 [employer].

## 2018-10-15 NOTE — Addendum Note (Signed)
Addended by: Arliss Journey on: 10/15/2018 09:18 AM   Modules accepted: Orders

## 2018-10-16 ENCOUNTER — Ambulatory Visit: Payer: Medicaid Other | Admitting: Physical Therapy

## 2018-10-22 ENCOUNTER — Other Ambulatory Visit: Payer: Self-pay

## 2018-10-22 ENCOUNTER — Ambulatory Visit: Payer: Medicaid Other | Attending: Physical Medicine and Rehabilitation | Admitting: Physical Therapy

## 2018-10-22 ENCOUNTER — Encounter: Payer: Self-pay | Admitting: Physical Therapy

## 2018-10-22 DIAGNOSIS — M25632 Stiffness of left wrist, not elsewhere classified: Secondary | ICD-10-CM | POA: Insufficient documentation

## 2018-10-22 DIAGNOSIS — R293 Abnormal posture: Secondary | ICD-10-CM

## 2018-10-22 DIAGNOSIS — M542 Cervicalgia: Secondary | ICD-10-CM | POA: Diagnosis present

## 2018-10-22 DIAGNOSIS — M6281 Muscle weakness (generalized): Secondary | ICD-10-CM

## 2018-10-22 DIAGNOSIS — R29818 Other symptoms and signs involving the nervous system: Secondary | ICD-10-CM | POA: Diagnosis present

## 2018-10-22 DIAGNOSIS — M25612 Stiffness of left shoulder, not elsewhere classified: Secondary | ICD-10-CM | POA: Insufficient documentation

## 2018-10-22 DIAGNOSIS — M25611 Stiffness of right shoulder, not elsewhere classified: Secondary | ICD-10-CM | POA: Diagnosis present

## 2018-10-22 DIAGNOSIS — R278 Other lack of coordination: Secondary | ICD-10-CM | POA: Diagnosis present

## 2018-10-22 DIAGNOSIS — R208 Other disturbances of skin sensation: Secondary | ICD-10-CM | POA: Diagnosis present

## 2018-10-22 DIAGNOSIS — M25631 Stiffness of right wrist, not elsewhere classified: Secondary | ICD-10-CM | POA: Diagnosis present

## 2018-10-22 DIAGNOSIS — G8222 Paraplegia, incomplete: Secondary | ICD-10-CM | POA: Diagnosis present

## 2018-10-22 MED ORDER — GENERIC EXTERNAL MEDICATION
1.00 | Status: DC
Start: 2018-10-22 — End: 2018-10-22

## 2018-10-22 MED ORDER — GENERIC EXTERNAL MEDICATION
400.00 | Status: DC
Start: 2018-10-22 — End: 2018-10-22

## 2018-10-22 MED ORDER — DICLOFENAC SODIUM 1 % TD GEL
2.00 | TRANSDERMAL | Status: DC
Start: ? — End: 2018-10-22

## 2018-10-22 MED ORDER — GENERIC EXTERNAL MEDICATION
Status: DC
Start: ? — End: 2018-10-22

## 2018-10-22 MED ORDER — SIMETHICONE 80 MG PO CHEW
80.00 | CHEWABLE_TABLET | ORAL | Status: DC
Start: 2018-10-21 — End: 2018-10-22

## 2018-10-22 MED ORDER — FLUOXETINE HCL 10 MG PO CAPS
10.00 | ORAL_CAPSULE | ORAL | Status: DC
Start: 2018-10-22 — End: 2018-10-22

## 2018-10-22 MED ORDER — ACETAMINOPHEN 325 MG PO TABS
650.00 | ORAL_TABLET | ORAL | Status: DC
Start: ? — End: 2018-10-22

## 2018-10-22 MED ORDER — OXYCODONE HCL 5 MG PO TABS
5.00 | ORAL_TABLET | ORAL | Status: DC
Start: ? — End: 2018-10-22

## 2018-10-22 MED ORDER — BACLOFEN 5 MG PO TABS
7.50 | ORAL_TABLET | ORAL | Status: DC
Start: 2018-10-22 — End: 2018-10-22

## 2018-10-22 MED ORDER — MELATONIN 3 MG PO TABS
3.00 | ORAL_TABLET | ORAL | Status: DC
Start: 2018-10-21 — End: 2018-10-22

## 2018-10-22 MED ORDER — BISACODYL 10 MG RE SUPP
10.00 | RECTAL | Status: DC
Start: 2018-10-21 — End: 2018-10-22

## 2018-10-22 NOTE — Therapy (Signed)
[facility] [address] [place], [place], [address] Phone: [phone]   Fax:  [phone]  Physical Therapy Treatment  Patient Details  Name: [name] MRN: [id number] Date of Birth: Mar 15, 1965 Referring Provider (PT): Doran Clay, MD   Encounter Date: 10/22/2018  PT End of Session - 10/22/18 1328    Visit Number  2    Number of Visits  4   eval + initial 3 visits from Medicaid   Date for PT Re-Evaluation  11/05/18    Authorization Type  Medicaid - pt has OT eval pending for September 17th    Authorization Time Period  9/4 - 9/24 [facility] approved 3 visits    Authorization - Visit Number  1    Authorization - Number of Visits  3    PT Start Time  0850    PT Stop Time  0940    PT Time Calculation (min)  50 min    Activity Tolerance  Patient tolerated treatment well    Behavior During Therapy  [facility] for tasks assessed/performed       Past Medical History:  Diagnosis Date  . Diabetes mellitus without complication ([place])     History reviewed. No pertinent surgical history.  There were no vitals filed for this visit.  Subjective Assessment - 10/22/18 0858    Subjective  Pt had appointment to establish care with new PCP.  Started on blood thinner.  Nothing else new to report.    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    Currently in Pain?  No/denies         [facility] PT Assessment - 10/22/18 0904      ROM / Strength   AROM / PROM / Strength  AROM      AROM   Overall AROM   Deficits    Overall AROM Comments  Keeps head laterally flexed to L, overall increased tone on L  side.  Also performed general assessment of LE.  RLE more flaccid and moves into flexion and extension easily.  LLE mild hypertonicity noted.  In cervical spine applied over pressure: With side bending to R and L muscular end feel noted; flexion muscular end feel.  Rotation harder end feel noted bilaterally    AROM Assessment Site  Cervical    Cervical Flexion  5    Cervical Extension  10    Cervical - Right Side Bend  10 - 0 (10 total deg)   from 10 deg of lateral flexion to L   Cervical - Left Side Bend  10-20   starts with 10 deg of lateral flexion at rest   Cervical - Right Rotation  18    Cervical - Left Rotation  15                   [facility] Adult PT Treatment/Exercise - 10/22/18 1325      Bed Mobility   Bed Mobility  Rolling Right;Supine to Sit;Right Sidelying to Sit    Rolling Right  Maximal Assistance - Patient 25-49%    Right Sidelying to Sit  2 Helpers  Supine to Sit  2 Helpers      Transfers   Transfers  Lateral/Scoot Transfers    Lateral/Scoot Transfers  1: +2 Total assist;With slide board    Lateral Transfers: Patient Percentage  10%    Lateral/Scoot Transfer Details (indicate cue type and reason)  Assistance to keep patient forward flexed to unweight hips.  Pt able to assist slightly with LUE.  Second person behind assisting with trunk stability during lateral leans and scooting posteriorly in the chair.    Comments  Pt able to recline and tilt power w/c back to assist with repositioning once back in w/c             PT Education - 10/22/18 1327    Education Details  focus of next therapy session on shoulder and cervical ROM, LE ROM, bed mobility    Person(s) Educated  Patient;Spouse    Methods  Explanation    Comprehension  Verbalized understanding       PT Short Term Goals - 10/22/18 1335      PT SHORT TERM GOAL #1   Title  Patient will direct initial HEP independently to wife/caregivers for stretching LEs in order to prevent risk of  contractures. ALL STGS DUE BY 3RD VISIT - 11/05/18    Baseline  dependent    Time  3    Period  --   visits   Status  New      PT SHORT TERM GOAL #2   Title  Patient will perform sitting balance with A/P weight shifting with mod A in order to prepare for sliding board lateral transfers from mat <> w/c.    Baseline  max A needed posteriorly in order for pt to remain balanced in short sitting with feet supported on block.    Status  New      PT SHORT TERM GOAL #3   Title  Patient will perform rolling right and left in bed with mod A in order to decrease caregiver burden and for pressure relief.    Baseline  not yet assessed    Status  New      PT SHORT TERM GOAL #4   Title  Patient will perform scoot pivot t/f on sliding board from w/c <> mat table (from one surface elevated with gravity assisting) with max A.    Baseline  total A x2 on 10/08/18    Status  New        PT Long Term Goals - 10/08/18 1531      PT LONG TERM GOAL #1   Title  Patient will direct final HEP independently to wife/caregivers for stretching LEs in order to prevent risk of contractures. ALL LTGS DUE BY 12TH VISIT    Baseline  dependent    Time  12    Period  --   visits   Status  New      PT LONG TERM GOAL #2   Title  When pt receives his own w/c (vs. loaner chair) pt will demonstrate independence with w/c parts in order to safely prepare for transfers.    Baseline  not yet assessed    Status  New      PT LONG TERM GOAL #3   Title  Patient will perform sitting balance with A/P and M/L weight shifting with supervision/min A in order to perform sliding board lateral transfers from mat <> w/c.    Baseline  currently max A for dynamic sitting balance  Status  New      PT LONG TERM GOAL #4   Title  Patient will perform rolling right and left in bed with min A in order to decrease caregiver burden and for pressure relief.    Baseline  not yet assessed    Status  New      PT LONG TERM GOAL #5   Title   Patient will perform scoot pivot t/f on sliding board from w/c <> mat table on level surface with min-mod A.    Baseline  total A x2 with gravity assisting transfer    Status  New            Plan - 10/22/18 1329    Clinical Impression Statement  Treatment session today focused on further assessment of cervical ROM and if decreased ROM is due to mm tightness/immobility or fusion.  Pt demonstrates significant limited ROM but does have muscular end feel to lateral flexion and flexion; harder end feel with rotation.  Pt also demonstrates increased tone and decreased ROM on L side trunk and LE.  Will initiate HEP next session with wife.  Performed bed mobility assessment; pt reports not being able to use leg loops on rehab and requires total A for bed mobility currently; able to use some UE momentum to initiate rolling to R.  Will continue to address and educate pt on how to perform bed mobility with greater independence.    Personal Factors and Comorbidities  Past/Current Experience;Profession;Comorbidity 1;Finances    Comorbidities  T2DM    Examination-Activity Limitations  Bathing;Bed Mobility;Stand;Sit;Transfers    Examination-Participation Restrictions  Community Activity;Driving    Stability/Clinical Decision Making  Unstable/Unpredictable    Rehab Potential  Good    PT Frequency  1x / week    PT Duration  3 weeks   initially for medicaid   PT Treatment/Interventions  ADLs/Self Care Home Management;DME Instruction;Functional mobility training;Therapeutic activities;Therapeutic exercise;Balance training;Neuromuscular re-education;Wheelchair mobility training;Patient/family education;Passive range of motion;Energy conservation;Electrical Stimulation;Orthotic Fit/Training;Manual techniques    PT Next Visit Plan  RUE WB status?  transfer onto mat and demonstrate how to perform neck stretches/isometric strengthening, LLE stretches, rolling with UE momentum; pushing up to sitting.  follow up with a  physiatrist here in town? (Dr. Hermelinda Medicus). Sitting balance A/P and M/L with BLE on step, teaching wife initial HEP for home stretching program to decrease risk of contractures.    Consulted and Agree with Plan of Care  Patient;Family member/caregiver    Family Member Consulted  wife, Byrd Hesselbach       Patient will benefit from skilled therapeutic intervention in order to improve the following deficits and impairments:  Decreased activity tolerance, Decreased balance, Decreased mobility, Decreased endurance, Decreased range of motion, Decreased strength, Impaired flexibility, Impaired sensation, Impaired UE functional use, Pain, Impaired tone, Postural dysfunction  Visit Diagnosis: Other symptoms and signs involving the nervous system  Other disturbances of skin sensation  Muscle weakness (generalized)  Paraplegia, incomplete (HCC)  Abnormal posture  Cervicalgia     Problem List There are no active problems to display for this patient.   Dierdre Highman, PT, DPT 10/22/18    1:37 PM    [facility] [address] [place], [place], [address] Phone: [phone]   Fax:  [phone]  Name: Giuliani Azam MRN: [id number] Date of Birth: 06/25/1965

## 2018-10-29 ENCOUNTER — Ambulatory Visit: Payer: Medicaid Other | Admitting: Occupational Therapy

## 2018-10-29 ENCOUNTER — Ambulatory Visit: Payer: Medicaid Other | Admitting: Physical Therapy

## 2018-10-29 ENCOUNTER — Other Ambulatory Visit: Payer: Self-pay

## 2018-10-29 DIAGNOSIS — R29818 Other symptoms and signs involving the nervous system: Secondary | ICD-10-CM | POA: Diagnosis not present

## 2018-10-29 DIAGNOSIS — M25631 Stiffness of right wrist, not elsewhere classified: Secondary | ICD-10-CM

## 2018-10-29 DIAGNOSIS — R278 Other lack of coordination: Secondary | ICD-10-CM

## 2018-10-29 DIAGNOSIS — M25612 Stiffness of left shoulder, not elsewhere classified: Secondary | ICD-10-CM

## 2018-10-29 DIAGNOSIS — M25632 Stiffness of left wrist, not elsewhere classified: Secondary | ICD-10-CM

## 2018-10-29 DIAGNOSIS — M6281 Muscle weakness (generalized): Secondary | ICD-10-CM

## 2018-10-29 DIAGNOSIS — R293 Abnormal posture: Secondary | ICD-10-CM

## 2018-10-29 DIAGNOSIS — M542 Cervicalgia: Secondary | ICD-10-CM

## 2018-10-29 DIAGNOSIS — R208 Other disturbances of skin sensation: Secondary | ICD-10-CM

## 2018-10-29 DIAGNOSIS — M25611 Stiffness of right shoulder, not elsewhere classified: Secondary | ICD-10-CM

## 2018-10-29 DIAGNOSIS — G8222 Paraplegia, incomplete: Secondary | ICD-10-CM

## 2018-10-29 NOTE — Patient Instructions (Signed)
Access Code: [email or web address]  URL: [email or web address]  Date: 10/29/2018  Prepared by: Janann August    Exercises Seated Isometric Cervical Rotation - 6 reps - 2 sets - 3x daily - 7x weekly Seated Isometric Cervical Sidebending - 6 reps - 2 sets - 3x daily - 7x weekly Supine Pectoralis Stretch - 3 sets - 30 hold - 3x daily - 7x weekly Seated Isometric Cervical Flexion - 6 reps - 2 sets - 3x daily - 7x weekly Seated Isometric Cervical Extension - 6 reps - 2 sets - 3x daily - 7x weekly

## 2018-10-29 NOTE — Patient Instructions (Signed)
   AROM: Finger Flexion / Extension   Actively bend fingers of  hand. Start with knuckles furthest from palm, and slowly make a fist. Hold __5__ seconds. Relax. Then straighten fingers as far as possible. Repeat _10-15___ times per set.  Do _4-6___ sessions per day.  Copyright  VHI. All rights reserved.    AROM: Wrist Extension   .  With __right__ palm down, bend wrist up. Repeat __15__ times per set.  Do __4-6__ sessions per day.        AROM: Forearm Pronation / Supination   With _right___ arm in handshake position, slowly rotate palm down until stretch is felt. Relax. Then rotate palm up until stretch is felt. Repeat _15___ times per set. Do _4-6___ sessions per day.  Copyright  VHI. All rights reserved.

## 2018-10-29 NOTE — Therapy (Signed)
[facility] [address] [place], [place], [address] Phone: [phone]   Fax:  [phone]  Physical Therapy Treatment  Patient Details  Name: [name] MRN: [id number] Date of Birth: 10/13/1965 Referring Provider (PT): Lanny Cramp, MD   Encounter Date: 10/29/2018  PT End of Session - 10/29/18 1654    Visit Number  3    Number of Visits  4   eval + initial 3 visits from Medicaid   Date for PT Re-Evaluation  11/05/18    Authorization Type  Medicaid    Authorization Time Period  9/4 - 9/24 CCME approved 3 visits    Authorization - Visit Number  2    Authorization - Number of Visits  3    PT Start Time  1101    PT Stop Time  1146    PT Time Calculation (min)  45 min    Activity Tolerance  Patient tolerated treatment well    Behavior During Therapy  [facility] for tasks assessed/performed       Past Medical History:  Diagnosis Date  . Diabetes mellitus without complication (HCC)     No past surgical history on file.  There were no vitals filed for this visit.  Subjective Assessment - 10/29/18 1645    Subjective  Not wearing shoes but reports swelling is going down in LE.    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                       [facility] Adult PT Treatment/Exercise - 10/29/18 1646      Bed Mobility   Bed Mobility  Rolling Right    Rolling Right  Total Assistance - Patient < 25%    Right Sidelying to Sit  2 Helpers    Supine to Sit  2 Helpers      Transfers   Transfers  Lateral/Scoot Transfers    Lateral/Scoot Transfers  1: +2 Total assist;With slide  board    Lateral Transfers: Patient Percentage  10%    Lateral/Scoot Transfer Details (indicate cue type and reason)  still unable to use RUE for transfers but pt demonstrating improved ability to maintain forward lean to off weight hips/buttocks for transfer; using UE support for balance during transfer    Number of Reps  2 sets      Exercises   Exercises  Neck;Shoulder      Neck Exercises: Supine   Cervical Isometrics  Flexion;Extension;Right lateral flexion;Left lateral flexion;Right rotation;Left rotation;5 secs;10 reps    Cervical Isometrics Limitations  supine on wedge      Shoulder Exercises: Supine   External Rotation  Right;AROM;Strengthening;Left;10 reps    External Rotation Limitations  in supine on wedge - isometric with scapular retraction with 5 second hold      Manual Therapy   Manual Therapy  Passive ROM    Passive ROM  upper trap with shoulder depression; contract-relax shoulder elevation x 6 seconds followed by stretch to R and L side      Neck Exercises: Stretches   Upper Trapezius Stretch  Right;Left;2 reps;60 seconds  Upper Trapezius Stretch Limitations  PROM with shoulder depression in supine on wedge             PT Education - 10/29/18 1653    Education Details  cervical isometric and shoulder isometric strengthening    Person(s) Educated  Patient;Spouse    Methods  Explanation;Demonstration;Handout    Comprehension  Verbalized understanding;Returned demonstration      Access Code: [email or web address]  URL: [email or web address]  Date: 10/29/2018  Prepared by: Janann August    Exercises Seated Isometric Cervical Rotation - 6 reps - 2 sets - 3x daily - 7x weekly Seated Isometric Cervical Sidebending - 6 reps - 2 sets - 3x daily - 7x weekly Supine Pectoralis Stretch - 3 sets - 30 hold - 3x daily - 7x weekly Seated Isometric Cervical Flexion - 6 reps - 2 sets - 3x daily - 7x weekly Seated Isometric Cervical Extension - 6 reps - 2 sets - 3x  daily - 7x weekly   PT Short Term Goals - 10/22/18 1335      PT SHORT TERM GOAL #1   Title  Patient will direct initial HEP independently to wife/caregivers for stretching LEs in order to prevent risk of contractures. ALL STGS DUE BY 3RD VISIT - 11/05/18    Baseline  dependent    Time  3    Period  --   visits   Status  New      PT SHORT TERM GOAL #2   Title  Patient will perform sitting balance with A/P weight shifting with mod A in order to prepare for sliding board lateral transfers from mat <> w/c.    Baseline  max A needed posteriorly in order for pt to remain balanced in short sitting with feet supported on block.    Status  New      PT SHORT TERM GOAL #3   Title  Patient will perform rolling right and left in bed with mod A in order to decrease caregiver burden and for pressure relief.    Baseline  not yet assessed    Status  New      PT SHORT TERM GOAL #4   Title  Patient will perform scoot pivot t/f on sliding board from w/c <> mat table (from one surface elevated with gravity assisting) with max A.    Baseline  total A x2 on 10/08/18    Status  New        PT Long Term Goals - 10/08/18 1531      PT LONG TERM GOAL #1   Title  Patient will direct final HEP independently to wife/caregivers for stretching LEs in order to prevent risk of contractures. ALL LTGS DUE BY 12TH VISIT    Baseline  dependent    Time  12    Period  --   visits   Status  New      PT LONG TERM GOAL #2   Title  When pt receives his own w/c (vs. loaner chair) pt will demonstrate independence with w/c parts in order to safely prepare for transfers.    Baseline  not yet assessed    Status  New      PT LONG TERM GOAL #3   Title  Patient will perform sitting balance with A/P and M/L weight shifting with supervision/min A in order to perform sliding board lateral transfers from mat <> w/c.    Baseline  currently max A for dynamic sitting balance    Status  New      PT LONG TERM GOAL #4   Title   Patient will perform rolling right and left in bed with min A in order to decrease caregiver burden and for pressure relief.    Baseline  not yet assessed    Status  New      PT LONG TERM GOAL #5   Title  Patient will perform scoot pivot t/f on sliding board from w/c <> mat table on level surface with min-mod A.    Baseline  total A x2 with gravity assisting transfer    Status  New            Plan - 10/29/18 1654    Clinical Impression Statement  Treatment session focused on initiating HEP for cervical spine ROM within available range, stretching of shoulder muscles and strengthening posterior shoulder muscles for posture and postural control.  Provided pt and wife with handout of initial HEP.  Will continue with LE stretches and will have wife return demosntrate next session.    Personal Factors and Comorbidities  Past/Current Experience;Profession;Comorbidity 1;Finances    Comorbidities  T2DM    Examination-Activity Limitations  Bathing;Bed Mobility;Stand;Sit;Transfers    Examination-Participation Restrictions  Community Activity;Driving    Stability/Clinical Decision Making  Unstable/Unpredictable    Rehab Potential  Good    PT Frequency  1x / week    PT Duration  3 weeks   initially for medicaid   PT Treatment/Interventions  ADLs/Self Care Home Management;DME Instruction;Functional mobility training;Therapeutic activities;Therapeutic exercise;Balance training;Neuromuscular re-education;Wheelchair mobility training;Patient/family education;Passive range of motion;Energy conservation;Electrical Stimulation;Orthotic Fit/Training;Manual techniques    PT Next Visit Plan  check of STG and request 12 more visits; RUE WB status? how are neck exercises going?  add to HEP: LLE stretches, rolling with UE momentum; pushing up to sitting - long sitting, circle sitting.  follow up with a physiatrist here in town? (Dr. Hermelinda MedicusSchwartz). Sitting balance A/P and M/L with BLE on step, teaching wife initial  HEP for home stretching program to decrease risk of contractures.    Consulted and Agree with Plan of Care  Patient;Family member/caregiver    Family Member Consulted  wife, [name]       Patient will benefit from skilled therapeutic intervention in order to improve the following deficits and impairments:  Decreased activity tolerance, Decreased balance, Decreased mobility, Decreased endurance, Decreased range of motion, Decreased strength, Impaired flexibility, Impaired sensation, Impaired UE functional use, Pain, Impaired tone, Postural dysfunction  Visit Diagnosis: Cervicalgia  Abnormal posture  Paraplegia, incomplete (HCC)  Muscle weakness (generalized)  Other disturbances of skin sensation  Other symptoms and signs involving the nervous system     Problem List There are no active problems to display for this patient.   Dierdre HighmanAudra F Shauna Bodkins, PT, DPT 10/29/18    5:03 PM    [facility] [address] [place], [place], [address] Phone: [phone]   Fax:  [phone]  Name: Lonzo CandyCarlos Jobson MRN: [id number] Date of Birth: 1965-10-17

## 2018-10-30 NOTE — Therapy (Signed)
[facility] [address] [place], [place], [address] Phone: [phone]   Fax:  [phone]  Occupational Therapy Evaluation  Patient Details  Name: [name] MRN: [id number] Date of Birth: 1965-08-05 Referring Provider (OT): Dr. Alois Cliche'Brien   Encounter Date: 10/29/2018  OT End of Session - 10/29/18 1204    Visit Number  1    Number of Visits  13    Date for OT Re-Evaluation  01/28/19    Authorization Type  Medicaid    OT Start Time  1202    OT Stop Time  1240    OT Time Calculation (min)  38 min    Activity Tolerance  Patient tolerated treatment well    Behavior During Therapy  [facility] for tasks assessed/performed       Past Medical History:  Diagnosis Date  . Diabetes mellitus without complication (HCC)     No past surgical history on file.  There were no vitals filed for this visit.  Subjective Assessment - 10/30/18 1140    Subjective   Pt reports he wants to get better    Pertinent History  1 burst fx with [place] transection s/p C4-T6 spinal fusion (6/10), incomplete para deficits and s/p distal radius fx. Pt was d/c home with family on 09/24/2018.  Pt's PMH is significant for: T2DM, recent LE DVT    Limitations  cleared for AA/ROM, P/ROM and strengthening to RUE, (pt was originally NWB, will plan to perform WB to tolerance as pt is now 12 weeks post injury and cleared for strengthening.    Patient Stated Goals  get better    Currently in Pain?  No/denies        [facility] Assessment - 10/30/18 0001      Assessment   Medical Diagnosis  SCI    Referring Provider (OT)  Dr. Alois Cliche'Brien    Onset Date/Surgical Date  07/22/18    Prior Therapy  PT, OT, S[facility]      Precautions   Precautions  Fall    Precaution Comments  Has an abdominal binder at home for BP management, has not been wearing it due to fear of pressure sores. Figure 8 wraps for BP when OOB. autonomic dysreflexia (previous  episodes at inpatient rehab in [facility]).       Restrictions   Weight Bearing Restrictions  Yes    Other Position/Activity Restrictions  WBAT R elbow. orginally NWB through hand/ wrist ,   now cleared for A/ROM, P/ROM and strengthening pre MD orders, will WB through RUE to tolerance, aspen collar D/C at [facility]      Balance Screen   Has the patient fallen in the past 6 months  Yes    How many times?  1    Has the patient had a decrease in activity level because of a fear of falling?   Yes    Is the patient reluctant to leave their home because of a fear of falling?   No      Home  Environment   Family/patient expects to be discharged to:  Private residence    Living Arrangements  Spouse/significant other    Available Help at Discharge  Family    Type of Home  House    Home Layout  One level    Bathroom Shower/Tub  --   sponge bath   Lives With  Spouse;Family      Prior Function   Level  of Independence  Independent    Vocation  On disability    Biomedical scientist      ADL   Eating/Feeding  Modified independent    Grooming  Minimal assistance    Upper Body Bathing  Moderate assistance    Lower Body Bathing  + 1 Total assistance    Upper Body Dressing  Maximal assistance    Lower Body Dressing  +1 Total aassistance    Toilet Transfer  Other (comment)   bowel program in bed   Tub/Shower Transfer  --   currently bathing in bed, does not have shower chair   ADL comments  has hoyer lift at home      Mobility   Mobility Status  Needs assist   total A +2 for sliding board transfers, has hoyer lift     Cognition   Overall Cognitive Status  Within Functional Limits for tasks assessed      Posture/Postural Control   Posture/Postural Control  Postural limitations    Postural Limitations  Flexed trunk;Posterior pelvic tilt;Forward head;Rounded Shoulders      Sensation   Light Touch  Appears Intact   for UE's   Additional Comments  absent below T1  dermatome to light touch (per PT D/C summary from [facility] on 09/25/18) - only LE tested today      Coordination   Fine Motor Movements are Fluid and Coordinated  No    Coordination  decreased coordination in bilateral hands due to decreased ROM and strength      ROM / Strength   AROM / PROM / Strength  AROM;Strength      AROM   Overall AROM   Deficits    AROM Assessment Site  Shoulder;Elbow;Forearm;Wrist;Finger    Right/Left Shoulder  Right;Left    Right Shoulder Flexion  95 Degrees    Right Shoulder ABduction  50 Degrees    Left Shoulder Flexion  105 Degrees    Left Shoulder ABduction  90 Degrees    Right/Left Elbow  Right;Left    Right Elbow Flexion  135    Right Elbow Extension  -30    Left Elbow Flexion  130    Left Elbow Extension  -20    Right/Left Forearm  Right;Left    Right Forearm Pronation  60 Degrees    Right Forearm Supination  55 Degrees    Left Forearm Pronation  65 Degrees    Left Forearm Supination  80 Degrees    Right/Left Wrist  Left    Right Wrist Extension  45 Degrees    Right Wrist Flexion  45 Degrees    Left Wrist Extension  45 Degrees    Left Wrist Flexion  45 Degrees    Right/Left Finger  Left    Right Composite Finger Flexion  --   90%   Left Composite Finger Flexion  --   660%, limited flexion at index and middle, opposes digit 2     Strength   Overall Strength  Deficits    Overall Strength Comments  per [facility] d/c RUE strength is 4/5-4+/5 with exception of wrist-NT due to NWB, and LUE strength 4-/5 to 4/5      Hand Function   Right Hand Grip (lbs)  5    Left Hand Grip (lbs)  3                        OT Short Term Goals - 10/30/18 1610  OT SHORT TERM GOAL #1   Title  I with inital HEP- 12/14/2018    Baseline  dependent    Time  6    Period  Weeks    Status  New    Target Date  12/14/18      OT SHORT TERM GOAL #2   Title  Pt will donn shirt with min A    Baseline  mod A    Time  6    Period  Weeks    Status   New      OT SHORT TERM GOAL #3   Title  Pt will demonstrate bilateral wrist flexion/ extension WFLs for ADLs/ IADLS.    Baseline  45/45 wrist flexion/ extension bilateral UE's    Time  6    Period  Weeks    Status  New      OT SHORT TERM GOAL #4   Title  Pt will perform LB dressing with mod A    Baseline  dependent    Time  6    Period  Weeks    Status  New      OT SHORT TERM GOAL #5   Title  Pt will perform UB bathing with Min A, and LB bathing with mod A    Baseline  UB mod A, LB total A in bed    Time  6    Period  Weeks    Status  New      OT SHORT TERM GOAL #6   Title  Pt will demonstrate ability to retireve a lightweight object at 110 shoulder flexion for RUE for increased ease with ADLs.    Baseline  95* shoulder flexion    Time  6    Period  Weeks    Status  New      OT SHORT TERM GOAL #7   Title  Pt will demonstrate ability to retrieve a lightweight object at 115* shoulder flexion  with LUE for increased ease with ADLs,    Baseline  105*    Time  6    Period  Weeks    Status  New        OT Long Term Goals - 10/30/18 0759      OT LONG TERM GOAL #1   Title  Pt will donn shirt with setup-01/28/2019    Baseline  mod A    Time  12    Period  Weeks    Status  New    Target Date  01/28/19      OT LONG TERM GOAL #2   Title  Pt will perform LB dressing with min A    Baseline  dependent/ total A    Time  12    Period  Weeks    Status  New      OT LONG TERM GOAL #3   Title  [facility] with mod A using sliding board    Baseline  total A +2 for basic transfers    Time  12    Period  Weeks    Status  New      OT LONG TERM GOAL #4   Title  Pt and family will verbalize understanding regarding recommendations for shower chair, DME    Baseline  dependent, pt does not yet have shower chair, currently bathing in bed    Time  12    Period  Weeks    Status  New  OT LONG TERM GOAL #5   Title  Pt will increase bilateral grip strength by 10 lbs  for increased ease with ADLs/ IADLS.    Baseline  RUE grip 5 lbs, LUE 3 lbs    Time  12    Period  Weeks    Status  New            Plan - 10/30/18 0745    Clinical Impression Statement  Patient is a 53 year old male referred to Neuro OPOT for evaluation s/p  s/p fall from elevated surface 07/22/2018, resulting in C1 burst fx with [place] transection s/p C4-T6 spinal fusion (6/10), incomplete para deficits and s/p distal radius fx. Pt was d/c home with family on 09/24/2018.  Pt's PMH is significant for: T2DM, recent LE DVT.  Pt presents with the following deficits decreased ROM, decreased strength, decreased trunk control, decreased sitting balance, decreased indpendence with transfers, decreased fine motor coordination. Pt would benefit from skilled OT to address these impairments and functional limitations to maximize patient's safety and indpendence with ADLs/ IADLs.    OT Occupational Profile and History  Detailed Assessment- Review of Records and additional review of physical, cognitive, psychosocial history related to current functional performance    Occupational performance deficits (Please refer to evaluation for details):  ADL's;IADL's;Work;Leisure;Social Participation    Body Structure / Function / Physical Skills  ADL;UE functional use;Endurance;Balance;Flexibility;Pain;FMC;ROM;GMC;Coordination;Sensation;Decreased knowledge of precautions;Decreased knowledge of use of DME;IADL;Strength;Dexterity;Mobility;Tone    Rehab Potential  Good    Clinical Decision Making  Several treatment options, min-mod task modification necessary    Comorbidities Affecting Occupational Performance:  May have comorbidities impacting occupational performance    Modification or Assistance to Complete Evaluation   Min-Moderate modification of tasks or assist with assess necessary to complete eval   interpreter used, spanish speaking   OT Frequency  1x / week   plus eval   OT Duration  12 weeks    OT  Treatment/Interventions  Self-care/ADL training;Ultrasound;Energy conservation;Aquatic Therapy;DME and/or AE instruction;Patient/family education;Paraffin;Gait Training;Passive range of motion;Balance training;Fluidtherapy;Cryotherapy;Splinting;Therapist, nutritional;Contrast Bath;Electrical Stimulation;Moist Heat;Therapeutic exercise;Manual Therapy;Therapeutic activities;Cognitive remediation/compensation;Neuromuscular education    Plan  progress HEP for R wrist, initiate HEP for UE's    Consulted and Agree with Plan of Care  Patient;Family member/caregiver    Family Member Consulted  wife,interpreter via video       Patient will benefit from skilled therapeutic intervention in order to improve the following deficits and impairments:   Body Structure / Function / Physical Skills: ADL, UE functional use, Endurance, Balance, Flexibility, Pain, FMC, ROM, GMC, Coordination, Sensation, Decreased knowledge of precautions, Decreased knowledge of use of DME, IADL, Strength, Dexterity, Mobility, Tone       Visit Diagnosis: Muscle weakness (generalized) - Plan: Ot plan of care cert/re-cert  Abnormal posture - Plan: Ot plan of care cert/re-cert  Other symptoms and signs involving the nervous system - Plan: Ot plan of care cert/re-cert  Stiffness of right shoulder, not elsewhere classified - Plan: Ot plan of care cert/re-cert  Stiffness of left shoulder, not elsewhere classified - Plan: Ot plan of care cert/re-cert  Stiffness of right wrist, not elsewhere classified - Plan: Ot plan of care cert/re-cert  Stiffness of left wrist, not elsewhere classified - Plan: Ot plan of care cert/re-cert  Other lack of coordination - Plan: Ot plan of care cert/re-cert    Problem List There are no active problems to display for this patient.   [name] 10/30/2018, 1:08 PM [name], OTR/L [phone] Phone: [phone] 1:08 PM 10/30/18 [facility]  [facility] [address] [place], [place], [address] Phone: [phone]   Fax:  [phone]  Name: [name] MRN: [id number] Date of Birth: [birth year]

## 2018-11-03 ENCOUNTER — Other Ambulatory Visit (INDEPENDENT_AMBULATORY_CARE_PROVIDER_SITE_OTHER): Payer: Self-pay | Admitting: Primary Care

## 2018-11-03 ENCOUNTER — Telehealth (INDEPENDENT_AMBULATORY_CARE_PROVIDER_SITE_OTHER): Payer: Self-pay

## 2018-11-03 DIAGNOSIS — S14109S Unspecified injury at unspecified level of cervical spinal cord, sequela: Secondary | ICD-10-CM

## 2018-11-03 DIAGNOSIS — S129XXS Fracture of neck, unspecified, sequela: Secondary | ICD-10-CM

## 2018-11-03 NOTE — Telephone Encounter (Signed)
Patient wife called to get an update on her husbands specialty care referrals. Patient states that PCP had to refer patient to specialty clinics here in [place] since patient was having a really hard time going to [place] for his care. Patient wife states that he also needs a refer (to radiology) to remove a filter that he has to help with blood clogs.   Please advice [phone]  Thank you Whitney Post

## 2018-11-03 NOTE — Telephone Encounter (Signed)
FWD to PCP. Jess Toney S Ellis Koffler, CMA  

## 2018-11-05 ENCOUNTER — Ambulatory Visit: Payer: Medicaid Other | Admitting: Occupational Therapy

## 2018-11-05 ENCOUNTER — Ambulatory Visit: Payer: Medicaid Other | Admitting: Physical Therapy

## 2018-11-05 ENCOUNTER — Other Ambulatory Visit: Payer: Self-pay

## 2018-11-05 DIAGNOSIS — R293 Abnormal posture: Secondary | ICD-10-CM

## 2018-11-05 DIAGNOSIS — R29818 Other symptoms and signs involving the nervous system: Secondary | ICD-10-CM | POA: Diagnosis not present

## 2018-11-05 DIAGNOSIS — R208 Other disturbances of skin sensation: Secondary | ICD-10-CM

## 2018-11-05 DIAGNOSIS — G8222 Paraplegia, incomplete: Secondary | ICD-10-CM

## 2018-11-05 DIAGNOSIS — M25612 Stiffness of left shoulder, not elsewhere classified: Secondary | ICD-10-CM

## 2018-11-05 DIAGNOSIS — M542 Cervicalgia: Secondary | ICD-10-CM

## 2018-11-05 DIAGNOSIS — M6281 Muscle weakness (generalized): Secondary | ICD-10-CM

## 2018-11-05 DIAGNOSIS — M25611 Stiffness of right shoulder, not elsewhere classified: Secondary | ICD-10-CM

## 2018-11-05 NOTE — Therapy (Signed)
[facility] [address] [place], [place], [address] Phone: [phone]   Fax:  [phone]  Physical Therapy Treatment  Patient Details  Name: [name] MRN: [id number] Date of Birth: 11/01/1965 Referring Provider (PT): Doran Clay, MD   Encounter Date: 11/05/2018  PT End of Session - 11/05/18 2202    Visit Number  4    Number of Visits  4   eval + initial 3 visits from Medicaid   Date for PT Re-Evaluation  11/05/18    Authorization Type  Medicaid    Authorization Time Period  9/4 - 9/24 [facility] approved 3 visits    Authorization - Visit Number  3    Authorization - Number of Visits  3    PT Start Time  1020    PT Stop Time  1100    PT Time Calculation (min)  40 min    Activity Tolerance  Patient limited by pain    Behavior During Therapy  [facility] for tasks assessed/performed       Past Medical History:  Diagnosis Date  . Diabetes mellitus without complication ([place])     No past surgical history on file.  There were no vitals filed for this visit.  Subjective Assessment - 11/05/18 2152    Subjective  Wearing shoes today.  Neck was a little sore after last session but feels he has a little more movement and that his head is a little straighter.    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    Currently in Pain?  Yes                       [facility] Adult PT Treatment/Exercise - 11/05/18 2153      Bed Mobility   Bed Mobility  Supine to Sit;Sit to Supine    Supine to Sit  2 Helpers    Sit to Supine  2 Helpers      Transfers   Transfers  Lateral/Scoot Transfers     Lateral/Scoot Transfers  1: +2 Total assist;With slide board    Lateral Transfers: Patient Percentage  10%    Lateral/Scoot Transfer Details (indicate cue type and reason)  Attempted to have pt place RUE down on arm rest to assist with transfer; unable to tolerate increased pressure through wrist.  Pt is demonstrating improved sitting balance while on board and not leaning as heavily on therapist      Therapeutic Activites    Therapeutic Activities  Other Therapeutic Activities    Other Therapeutic Activities  Transitioned from short sitting to reclined on wedge and then up into long sitting with +2 A.  One therapist provided support behind patient and OT in front of patient.  In long sitting focused on use of head movements to assist with sitting balance and initiating forward leaning.  Pt performed 4-5 leans forwards with bilat UE propped up on half domes (to decrease amount of wrist extension) and progressively increased forward lean by moving hands forwards.  Focused on maintaining forward lean by allowing elbow flexion and increased WB through UE.  Transitioned to placing  bilat UE at each side (hand on half dome) and slowly working UE slightly behind patients trunk and focusing on activation of elbow extensors to remain propped upright - reported some increased pain in R wrist.  During rest breaks assisted pt in stretching pectoralis muscles and scapular retraction.  Returned to UE on each side of patient performing lateral leaning to R and L elbow x 5 reps each and activating elbow extension to return to sitting upright.  At end pt returned to reclined on wedge to allow for rest break before OT session.             PT Education - 11/05/18 2202    Education Details  long sitting and propped on UE    Person(s) Educated  Patient;Spouse    Methods  Explanation;Demonstration    Comprehension  Need further instruction       PT Short Term Goals - 11/05/18 2203      PT SHORT TERM GOAL #1    Title  Patient will direct initial HEP independently to wife/caregivers for stretching LEs in order to prevent risk of contractures. ALL STGS DUE BY 3RD VISIT - 11/05/18    Baseline  have initiated cervical and shoulder ROM HEP; have not initiated stretching for LE    Time  3    Period  --   visits   Status  Partially Met      PT SHORT TERM GOAL #2   Title  Patient will perform sitting balance with A/P weight shifting with mod A in order to prepare for sliding board lateral transfers from mat <> w/c.    Baseline  total A in long sitting; short sitting can require as little as mod A but + 2 for safety    Status  Partially Met      PT SHORT TERM GOAL #3   Title  Patient will perform rolling right and left in bed with mod A in order to decrease caregiver burden and for pressure relief.    Baseline  total A    Status  Not Met      PT SHORT TERM GOAL #4   Title  Patient will perform scoot pivot t/f on sliding board from w/c <> mat table (from one surface elevated with gravity assisting) with max A.    Baseline  +2 assistance - just cleared to WB through R wrist to assist with transfers    Status  Not Met        PT Long Term Goals - 10/08/18 1531      PT LONG TERM GOAL #1   Title  Patient will direct final HEP independently to wife/caregivers for stretching LEs in order to prevent risk of contractures. ALL LTGS DUE BY 12TH VISIT    Baseline  dependent    Time  12    Period  --   visits   Status  New      PT LONG TERM GOAL #2   Title  When pt receives his own w/c (vs. loaner chair) pt will demonstrate independence with w/c parts in order to safely prepare for transfers.    Baseline  not yet assessed    Status  New      PT LONG TERM GOAL #3   Title  Patient will perform sitting balance with A/P and M/L weight shifting with supervision/min A in order to perform sliding board lateral transfers from mat <> w/c.    Baseline  currently max A  for dynamic sitting balance    Status  New       PT LONG TERM GOAL #4   Title  Patient will perform rolling right and left in bed with min A in order to decrease caregiver burden and for pressure relief.    Baseline  not yet assessed    Status  New      PT LONG TERM GOAL #5   Title  Patient will perform scoot pivot t/f on sliding board from w/c <> mat table on level surface with min-mod A.    Baseline  total A x2 with gravity assisting transfer    Status  New       New goals:   PT Short Term Goals - 11/05/18 2215      PT SHORT TERM GOAL #1   Title  Patient will direct initial HEP independently to wife/caregivers for stretching LEs in order to prevent risk of contractures.    Baseline  have initiated cervical and shoulder ROM HEP; have not initiated stretching for LE    Time  6    Period  Weeks   visits   Status  Revised    Target Date  12/20/18      PT SHORT TERM GOAL #2   Title  Patient will perform sitting balance with A/P weight shifting with mod A in order to prepare for sliding board lateral transfers from mat <> w/c.    Baseline  total A in long sitting; short sitting can require as little as mod A but + 2 for safety    Time  6    Period  Weeks    Status  Revised    Target Date  12/20/18      PT SHORT TERM GOAL #3   Title  Patient will perform rolling right and left in bed with mod A in order to decrease caregiver burden and for pressure relief.    Baseline  total A    Time  6    Period  Weeks    Status  Revised    Target Date  12/20/18      PT SHORT TERM GOAL #4   Title  Patient will perform scoot pivot t/f on sliding board from w/c <> mat table (from one surface elevated with gravity assisting) with max A.    Baseline  +2 assistance - just cleared to WB through R wrist to assist with transfers    Time  6    Period  Weeks    Status  Revised    Target Date  12/20/18      PT Long Term Goals - 11/05/18 2216      PT LONG TERM GOAL #1   Title  Patient will direct final HEP independently to  wife/caregivers for stretching LEs in order to prevent risk of contractures. ALL LTGS DUE BY 12TH VISIT    Baseline  dependent    Time  12    Period  --   visits   Status  Revised    Target Date  02/03/19      PT LONG TERM GOAL #2   Title  When pt receives his own w/c (vs. loaner chair) pt will demonstrate independence with w/c parts in order to safely prepare for transfers.    Baseline  not yet assessed    Time  12    Period  Weeks    Status  Revised    Target Date  02/03/19  PT LONG TERM GOAL #3   Title  Patient will perform sitting balance with A/P and M/L weight shifting with supervision/min A in order to perform sliding board lateral transfers from mat <> w/c.    Baseline  currently max A for dynamic sitting balance    Time  12    Period  Weeks    Status  Revised    Target Date  02/03/19      PT LONG TERM GOAL #4   Title  Patient will perform rolling right and left in bed with min A in order to decrease caregiver burden and for pressure relief.    Baseline  Total A    Time  12    Period  Weeks    Status  Revised    Target Date  02/03/19      PT LONG TERM GOAL #5   Title  Patient will perform scoot pivot t/f on sliding board from w/c <> mat table on level surface with min-mod A.    Baseline  total A x2 with gravity assisting transfer    Time  12    Period  Weeks    Status  Revised    Target Date  02/03/19           Plan - 11/05/18 2206    Clinical Impression Statement  Treatment session incorporated increased use and WB through RUE to assist with slideboard transfers, bed mobility and sitting balance.  Pt reported intermittent increase in R wrist pain with increased WB but pt able to demonstrate ability to maintain sitting balance in long sitting with UE support and minimal assistance from therapist; continued to require significant assistance to maintain support when WB through extended UE.  Pt is making steady progress - pt partially met 2/4 STG and did not  meet the other two goals.  Pt and wife are performing cervical and shoulder HEP and pt is demonstrating improvement in sitting posture.  Pt just recently cleared for WB and use of RUE and therefore has not progress to goal level for sitting balance and transfers.  Pt will benefit from continued skilled PT services to continue to address these impairments to maximize functional mobility independence and decrease burden of care.    Personal Factors and Comorbidities  Past/Current Experience;Profession;Comorbidity 1;Finances    Comorbidities  T2DM    Examination-Activity Limitations  Bathing;Bed Mobility;Stand;Sit;Transfers    Examination-Participation Restrictions  Community Activity;Driving    Stability/Clinical Decision Making  Unstable/Unpredictable    Rehab Potential  Good    PT Frequency  1x / week    PT Duration  12 weeks   initially for medicaid   PT Treatment/Interventions  ADLs/Self Care Home Management;DME Instruction;Functional mobility training;Therapeutic activities;Therapeutic exercise;Balance training;Neuromuscular re-education;Wheelchair mobility training;Patient/family education;Passive range of motion;Energy conservation;Electrical Stimulation;Orthotic Fit/Training;Manual techniques    PT Next Visit Plan  how are neck exercises going?  add to HEP: LLE stretches, rolling with UE momentum; pushing up to sitting - long sitting, circle sitting.  follow up with a physiatrist here in town? (Dr. Tessa Lerner). Sitting balance A/P and M/L with BLE on step, teaching wife initial HEP for home stretching program to decrease risk of contractures.    Consulted and Agree with Plan of Care  Patient;Family member/caregiver    Family Member Consulted  wife, Verdis Frederickson       Patient will benefit from skilled therapeutic intervention in order to improve the following deficits and impairments:  Decreased activity tolerance, Decreased balance, Decreased mobility, Decreased endurance,  Decreased range of motion,  Decreased strength, Impaired flexibility, Impaired sensation, Impaired UE functional use, Pain, Impaired tone, Postural dysfunction  Visit Diagnosis: Paraplegia, incomplete (HCC)  Cervicalgia  Muscle weakness (generalized)  Abnormal posture  Other symptoms and signs involving the nervous system  Other disturbances of skin sensation     Problem List There are no active problems to display for this patient.   Rico Junker, PT, DPT 11/05/18    10:17 PM    [facility] [address] [place], [place], [address] Phone: [phone]   Fax:  [phone]  Name: Joshus Rogan MRN: [id number] Date of Birth: 11-Jan-1966

## 2018-11-05 NOTE — Therapy (Signed)
[facility] [address] [place], [place], [address] Phone: [phone]   Fax:  [phone]  Occupational Therapy Treatment  Patient Details  Name: [name] MRN: [id number] Date of Birth: [birth year] Referring Provider (OT): Dr. Werner Lean   Encounter Date: 11/05/2018  OT End of Session - 11/05/18 1216    Visit Number  2    Number of Visits  13    Date for OT Re-Evaluation  01/28/19    Authorization Type  Medicaid    Authorization Time Period  12 visits  through 01/27/2019    Authorization - Visit Number  2    Authorization - Number of Visits  12    OT Start Time  1100    OT Stop Time  1145    OT Time Calculation (min)  45 min    Activity Tolerance  Patient tolerated treatment well    Behavior During Therapy  [facility] for tasks assessed/performed       Past Medical History:  Diagnosis Date  . Diabetes mellitus without complication ([place])     No past surgical history on file.  There were no vitals filed for this visit.  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 Pt was d/c home with family on 09/24/2018.  Pt's PMH is significant for: T2DM, recent LE DVT    Limitations  cleared for AA/ROM, P/ROM and strengthening to RUE, (pt was originally NWB, will plan to perform WB to tolerance as pt is now greater than 12 weeks post injury and cleared for strengthening.    Patient Stated Goals  get better    Currently in Pain?  Yes    Pain Score  6     Pain Location  Shoulder    Pain Orientation  Right    Pain Descriptors / Indicators  Aching    Pain Type  Acute pain    Pain Onset  More than a month ago    Pain Frequency  Intermittent    Aggravating Factors   malpositioning    Pain Relieving Factors  repositioning           Treatment: Supine  on wedge, hotpack applies to R shoulder x 15  mins while therapist performed joint and scapular mobs to left shoulder followed by AA/ROM shoulder flexion, abduction and elbow flexion extension, followed by pt performing these motions actively. Pt performed AA/ROM, AROM scapular retraction Joint and scapular mobs to right shoulder followed by AA/ROM shoulder flexion,  abduction then pt performed A/ROM shoulder flexion within tolerated ROM. Supine to sit total A +2, followed by sliding board transfer back to w/c total A +2.                  OT Short Term Goals - 10/30/18 0749      OT SHORT TERM GOAL #1   Title  I with inital HEP- 12/14/2018    Baseline  dependent    Time  6    Period  Weeks    Status  New    Target Date  12/14/18      OT SHORT TERM GOAL #2   Title  Pt will donn shirt with min A    Baseline  mod A    Time  6    Period  Weeks    Status  New      OT SHORT TERM GOAL #3   Title  Pt will demonstrate bilateral wrist flexion/ extension WFLs for ADLs/ IADLS.    Baseline  45/45 wrist flexion/ extension bilateral UE's    Time  6    Period  Weeks    Status  New      OT SHORT TERM GOAL #4   Title  Pt will perform LB dressing with mod A    Baseline  dependent    Time  6    Period  Weeks    Status  New      OT SHORT TERM GOAL #5   Title  Pt will perform UB bathing with Min A, and LB bathing with mod A    Baseline  UB mod A, LB total A in bed    Time  6    Period  Weeks    Status  New      OT SHORT TERM GOAL #6   Title  Pt will demonstrate ability to retireve a lightweight object at 110 shoulder flexion for RUE for increased ease with ADLs.    Baseline  95* shoulder flexion    Time  6    Period  Weeks    Status  New      OT SHORT TERM GOAL #7   Title  Pt will demonstrate ability to retrieve a lightweight object at 115* shoulder flexion  with LUE for increased ease with ADLs,    Baseline  105*    Time  6    Period  Weeks    Status  New         OT Long Term Goals - 10/30/18 0759      OT LONG TERM GOAL #1   Title  Pt will donn shirt with setup-01/28/2019    Baseline  mod A    Time  12    Period  Weeks    Status  New    Target Date  01/28/19      OT LONG TERM GOAL #2   Title  Pt will perform LB dressing with min A    Baseline  dependent/ total A    Time  12    Period  Weeks    Status  New      OT LONG TERM GOAL #3   Title  [facility] with mod A using sliding board    Baseline  total A +2 for basic transfers    Time  12    Period  Weeks    Status  New      OT LONG TERM GOAL #4   Title  Pt and family will verbalize understanding regarding recommendations for shower chair, DME    Baseline  dependent, pt does not yet have shower chair, currently bathing in bed    Time  12    Period  Weeks    Status  New      OT LONG TERM GOAL #5   Title  Pt will increase bilateral grip strength by 10 lbs for increased ease with ADLs/ IADLS.    Baseline  RUE grip 5 lbs, LUE 3 lbs    Time  12    Period  Weeks    Status  New            Plan - 11/05/18 1217    Clinical Impression Statement  Pt is progressing towards  goals. He reports he shoulders felt looserwith less pain after OT session.    OT Occupational Profile and History  Detailed Assessment- Review of Records and additional review of physical, cognitive, psychosocial history related to current functional performance    Occupational performance deficits (Please refer to evaluation for details):  ADL's;IADL's;Work;Leisure;Social Participation    Body Structure / Function / Physical Skills  ADL;UE functional use;Endurance;Balance;Flexibility;Pain;FMC;ROM;GMC;Coordination;Sensation;Decreased knowledge of precautions;Decreased knowledge of use of DME;IADL;Strength;Dexterity;Mobility;Tone    Rehab Potential  Good    Clinical Decision Making  Several treatment options, min-mod task modification necessary    Comorbidities Affecting Occupational Performance:   May have comorbidities impacting occupational performance   wrist fx, DVT,   Modification or Assistance to Complete Evaluation   No modification of tasks or assist necessary to complete eval   interpreter used, spanish speaking   OT Frequency  1x / week   plus eval   OT Duration  12 weeks    OT Treatment/Interventions  Self-care/ADL training;Ultrasound;Energy conservation;Aquatic Therapy;DME and/or AE instruction;Patient/family education;Paraffin;Gait Training;Passive range of motion;Balance training;Fluidtherapy;Cryotherapy;Splinting;Building services engineer;Contrast Bath;Electrical Stimulation;Moist Heat;Therapeutic exercise;Manual Therapy;Therapeutic activities;Cognitive remediation/compensation;Neuromuscular education    Plan  shoulder and scapular ROM, address R shoulder pain,  progress HEP for R wrist, long sit able for ADLs when additional assistance is available    OT Home Exercise Plan  A/ROM to R wrist issued, pt reports performing shoulder flexion/ abduction in supine at home    Consulted and Agree with Plan of Care  Patient;Family member/caregiver    Family Member Consulted  wife,interpreter via video       Patient will benefit from skilled therapeutic intervention in order to improve the following deficits and impairments:   Body Structure / Function / Physical Skills: ADL, UE functional use, Endurance, Balance, Flexibility, Pain, FMC, ROM, GMC, Coordination, Sensation, Decreased knowledge of precautions, Decreased knowledge of use of DME, IADL, Strength, Dexterity, Mobility, Tone       Visit Diagnosis: Abnormal posture  Muscle weakness (generalized)  Stiffness of right shoulder, not elsewhere classified  Stiffness of left shoulder, not elsewhere classified    Problem List There are no active problems to display for this patient.   Garvis Downum 11/05/2018, 5:02 PM  [facility] [address] [place], [place], [address] Phone: [phone]   Fax:  [phone]  Name: Nadeem Romanoski MRN: [id number] Date of Birth: 1965/04/23

## 2018-11-06 ENCOUNTER — Telehealth (INDEPENDENT_AMBULATORY_CARE_PROVIDER_SITE_OTHER): Payer: Self-pay

## 2018-11-06 ENCOUNTER — Ambulatory Visit: Payer: Medicaid Other | Admitting: Physical Therapy

## 2018-11-06 NOTE — Telephone Encounter (Signed)
Patient wife called to inform that feet were swollen and that his shoes caused him to get blisters. Patient wife stated that the hospital gave her information and how to treat the blister. Patient wife now states the the blisters are turning purple/black and on the right foot some but not a lot of liquid is coming out.   Please advice [phone]  Thank you Whitney Post

## 2018-11-06 NOTE — Telephone Encounter (Signed)
Spoke with PCP and per her advise instructed patient and wife to go to ED. Nat Christen, CMA

## 2018-11-06 NOTE — Telephone Encounter (Signed)
Patient advised to go to ED for evaluation.

## 2018-11-11 ENCOUNTER — Other Ambulatory Visit: Payer: Self-pay

## 2018-11-11 ENCOUNTER — Encounter (HOSPITAL_COMMUNITY): Payer: Self-pay

## 2018-11-11 ENCOUNTER — Ambulatory Visit: Payer: Medicaid Other | Admitting: Occupational Therapy

## 2018-11-11 ENCOUNTER — Emergency Department (HOSPITAL_COMMUNITY)
Admission: EM | Admit: 2018-11-11 | Discharge: 2018-11-11 | Disposition: A | Discharge status: Home / Self Care | Payer: Medicaid Other | Attending: Emergency Medicine | Admitting: Emergency Medicine

## 2018-11-11 DIAGNOSIS — Y999 Unspecified external cause status: Secondary | ICD-10-CM | POA: Insufficient documentation

## 2018-11-11 DIAGNOSIS — X58XXXA Exposure to other specified factors, initial encounter: Secondary | ICD-10-CM | POA: Diagnosis not present

## 2018-11-11 DIAGNOSIS — G8220 Paraplegia, unspecified: Secondary | ICD-10-CM | POA: Diagnosis not present

## 2018-11-11 DIAGNOSIS — Y929 Unspecified place or not applicable: Secondary | ICD-10-CM | POA: Diagnosis not present

## 2018-11-11 DIAGNOSIS — S90822A Blister (nonthermal), left foot, initial encounter: Secondary | ICD-10-CM | POA: Diagnosis not present

## 2018-11-11 DIAGNOSIS — S90821A Blister (nonthermal), right foot, initial encounter: Secondary | ICD-10-CM | POA: Insufficient documentation

## 2018-11-11 DIAGNOSIS — E119 Type 2 diabetes mellitus without complications: Secondary | ICD-10-CM | POA: Insufficient documentation

## 2018-11-11 DIAGNOSIS — Y939 Activity, unspecified: Secondary | ICD-10-CM | POA: Insufficient documentation

## 2018-11-11 DIAGNOSIS — M6281 Muscle weakness (generalized): Secondary | ICD-10-CM

## 2018-11-11 NOTE — Discharge Instructions (Addendum)
Follow up with your doctor.  Return here as needed 

## 2018-11-11 NOTE — ED Notes (Signed)
Applied bacitracin, non adherent bandage and guaze wrap to both feet as per VO from [facility].

## 2018-11-11 NOTE — ED Notes (Signed)
Patient verbalizes understanding of discharge instructions. Opportunity for questioning and answers were provided. Armband removed by staff, pt discharged from ED.  

## 2018-11-11 NOTE — Therapy (Signed)
[facility] [address] [place], [place], [address] Phone: [phone]   Fax:  [phone]  Occupational Therapy Treatment  Patient Details  Name: [name] MRN: [id number] Date of Birth: 26-Aug-1965 Referring Provider (OT): Dr. Alois Cliche   Encounter Date: 11/11/2018    Past Medical History:  Diagnosis Date  . Diabetes mellitus without complication (HCC)     No past surgical history on file.  There were no vitals filed for this visit.   Per chart, pt's wife contacted MD regarding blisters on pt's feet from shoes being too tight.  Wife reported that blisters were turning purple/black and some were oozing. Pt has history of DVT and autonomic dysreflexia.  MD directed pt and wife to go to the emergency room for evaluation. Pt and wife arrived this morning for therapy and when asked, reported they had not yet gone to the ED for evaluation. Explained to pt and wife via interpreter that it would not be safe for therapy given medical situation and that pt would need to go to the ED as soon as possible for evaluation. Pt and wife verbalized understanding. Called Medicaid transport who stated they are unable to transport pt to ED. Medicaid transport to transport pt home and then pt and wife have been instructed to call an ambulance to transport pt to ED.  Pt and wife verbalized agreement.  Also discussed with pt desire to make referral to local physiatrist for follow up care given that pt and wife are having great difficulty getting to [facility] for follow up appts.  Pt gave permission for this therapist to reach out to local physiatrist for possible referral. Will do so today.                        OT Short Term Goals - 10/30/18 0749      OT SHORT TERM GOAL #1   Title  I with inital HEP- 12/14/2018    Baseline  dependent    Time  6    Period  Weeks    Status  New    Target Date  12/14/18      OT SHORT  TERM GOAL #2   Title  Pt will donn shirt with min A    Baseline  mod A    Time  6    Period  Weeks    Status  New      OT SHORT TERM GOAL #3   Title  Pt will demonstrate bilateral wrist flexion/ extension WFLs for ADLs/ IADLS.    Baseline  45/45 wrist flexion/ extension bilateral UE's    Time  6    Period  Weeks    Status  New      OT SHORT TERM GOAL #4   Title  Pt will perform LB dressing with mod A    Baseline  dependent    Time  6    Period  Weeks    Status  New      OT SHORT TERM GOAL #5   Title  Pt will perform UB bathing with Min A, and LB bathing with mod A    Baseline  UB mod A, LB total A in bed    Time  6    Period  Weeks    Status  New      OT SHORT TERM GOAL #6   Title  Pt will demonstrate ability to retireve a Dietitian at  110 shoulder flexion for RUE for increased ease with ADLs.    Baseline  95* shoulder flexion    Time  6    Period  Weeks    Status  New      OT SHORT TERM GOAL #7   Title  Pt will demonstrate ability to retrieve a lightweight object at 115* shoulder flexion  with LUE for increased ease with ADLs,    Baseline  105*    Time  6    Period  Weeks    Status  New        OT Long Term Goals - 10/30/18 0759      OT LONG TERM GOAL #1   Title  Pt will donn shirt with setup-01/28/2019    Baseline  mod A    Time  12    Period  Weeks    Status  New    Target Date  01/28/19      OT LONG TERM GOAL #2   Title  Pt will perform LB dressing with min A    Baseline  dependent/ total A    Time  12    Period  Weeks    Status  New      OT LONG TERM GOAL #3   Title  Pt will transfer to [facility] with mod A using sliding board    Baseline  total A +2 for basic transfers    Time  12    Period  Weeks    Status  New      OT LONG TERM GOAL #4   Title  Pt and family will verbalize understanding regarding recommendations for shower chair, DME    Baseline  dependent, pt does not yet have shower chair, currently bathing in bed    Time  12     Period  Weeks    Status  New      OT LONG TERM GOAL #5   Title  Pt will increase bilateral grip strength by 10 lbs for increased ease with ADLs/ IADLS.    Baseline  RUE grip 5 lbs, LUE 3 lbs    Time  12    Period  Weeks    Status  New              Patient will benefit from skilled therapeutic intervention in order to improve the following deficits and impairments:           Visit Diagnosis: Muscle weakness (generalized)    Problem List There are no active problems to display for this patient.   Quay Burow, OTR/L 11/11/2018, 10:08 AM  [facility] [address] [place], [place], [address] Phone: [phone]   Fax:  [phone]  Name: [name] MRN: [id number] Date of Birth: 06-28-1965

## 2018-11-11 NOTE — ED Notes (Signed)
Notified EDP pt wants to leave.

## 2018-11-11 NOTE — ED Triage Notes (Addendum)
Pt sent here from [facility]-- was there for treatment but has open blisters on feet-- pt is in wheelchair as baseline.   States that blisters/sores started approx 1 week ago-- feet sweeling bilaterally--  Family with patient - speaks Spanish/some [place]

## 2018-11-12 ENCOUNTER — Telehealth (INDEPENDENT_AMBULATORY_CARE_PROVIDER_SITE_OTHER): Payer: Self-pay

## 2018-11-12 ENCOUNTER — Ambulatory Visit (INDEPENDENT_AMBULATORY_CARE_PROVIDER_SITE_OTHER): Payer: Medicaid Other

## 2018-11-12 NOTE — Telephone Encounter (Signed)
FWD to PCP. Tempestt S Roberts, CMA  

## 2018-11-12 NOTE — Telephone Encounter (Signed)
Patient called to make a medication refill for   Baclofen 5 MG TABS  Patient uses  [facility] [place], [place] - [address]  Please advice  [phone]

## 2018-11-12 NOTE — ED Provider Notes (Signed)
[facility] EMERGENCY DEPARTMENT Provider Note   CSN: [id number] Arrival date & time: 11/11/18  1033     History   Chief Complaint Chief Complaint  Patient presents with  . blisters to feet    HPI [name] is a 53 y.o. male.     HPI Patient presents to the emergency department with listers to both heels.  The patient wore tight fitting shoes and developed these blisters about a week ago.  Patient is a paraplegic and does not have sensation in his lower extremities.  The patient states he is having no pain.  He was sent here by rehab because they were concerned that these blisters are discolored.  The patient has no fevers or drainage from the areas.  The patient denies chest pain, shortness of breath, headache,blurred vision, neck pain, fever, cough, weakness, numbness, dizziness, anorexia, edema, abdominal pain, nausea, vomiting, diarrhea, rash, back pain, dysuria, hematemesis, bloody stool, near syncope, or syncope. Past Medical History:  Diagnosis Date  . Diabetes mellitus without complication (HCC)     There are no active problems to display for this patient.   History reviewed. No pertinent surgical history.      Home Medications    Prior to Admission medications   Medication Sig Start Date End Date Taking? Authorizing Provider  Baclofen 5 MG TABS Take 10 mg by mouth 3 (three) times daily as needed. 10/13/18 11/13/18  Grayce Sessions, NP  bisacodyl (DULCOLAX) 10 MG suppository Place rectally. 09/24/18   [provider]  diclofenac sodium (VOLTAREN) 1 % GEL Apply topically. 09/24/18   [provider]  FLUoxetine (PROZAC) 10 MG capsule Take 1 capsule (10 mg total) by mouth daily. 10/13/18   Grayce Sessions, NP  gabapentin (NEURONTIN) 400 MG capsule Take 1 capsule (400 mg total) by mouth 3 (three) times daily. 10/13/18   Grayce Sessions, NP  senna (SENOKOT) 8.6 MG tablet Take 8.6 mg by mouth. 09/25/18   [provider]   simethicone (MYLICON) 80 MG chewable tablet Chew by mouth. 09/24/18   [provider]    Family History History reviewed. No pertinent family history.  Social History Social History   Tobacco Use  . Smoking status: Never Smoker  . Smokeless tobacco: Never Used  Substance Use Topics  . Alcohol use: Yes  . Drug use: No     Allergies   Patient has no known allergies.   Review of Systems Review of Systems  All other systems negative except as documented in the HPI. All pertinent positives and negatives as reviewed in the HPI. Physical Exam Updated Vital Signs BP 120/88 (BP Location: Left Arm)   Pulse (!) 53   Temp 97.6 F (36.4 C) (Oral)   Resp 16   SpO2 99%   Physical Exam Vitals signs and nursing note reviewed.  Constitutional:      General: He is not in acute distress.    Appearance: He is well-developed.  HENT:     Head: Normocephalic and atraumatic.  Eyes:     Pupils: Pupils are equal, round, and reactive to light.  Pulmonary:     Effort: Pulmonary effort is normal.  Musculoskeletal:       Feet:  Skin:    General: Skin is warm and dry.  Neurological:     Mental Status: He is alert and oriented to person, place, and time.      ED Treatments / Results  Labs (all labs ordered are listed,  but only abnormal results are displayed) Labs Reviewed - No data to display  EKG None  Radiology No results found.  Procedures Procedures (including critical care time)  Medications Ordered in ED Medications - No data to display   Initial Impression / Assessment and Plan / ED Course  I have reviewed the triage vital signs and the nursing notes.  Pertinent labs & imaging results that were available during my care of the patient were reviewed by me and considered in my medical decision making (see chart for details).        The patient's area of blistering is not infected at this time.  The appear to be healing.  They were sent by the rehab  facility for further evaluation of these areas.  At this point these will need close follow-up to ensure they do not get infected. Final Clinical Impressions(s) / ED Diagnoses   Final diagnoses:  Friction blisters of sole of left foot, initial encounter  Friction blisters of sole of right foot, initial encounter    ED Discharge Orders    None       Dalia Heading, PA-C 11/12/18 1742    Hayden Rasmussen, MD 11/13/18 [phone]

## 2018-11-15 ENCOUNTER — Other Ambulatory Visit (INDEPENDENT_AMBULATORY_CARE_PROVIDER_SITE_OTHER): Payer: Self-pay | Admitting: Primary Care

## 2018-11-15 MED ORDER — BACLOFEN 5 MG PO TABS: ORAL_TABLET | ORAL | 1 refills | Status: DC

## 2018-11-15 NOTE — Progress Notes (Signed)
b

## 2018-11-16 ENCOUNTER — Other Ambulatory Visit (INDEPENDENT_AMBULATORY_CARE_PROVIDER_SITE_OTHER): Payer: Self-pay | Admitting: Primary Care

## 2018-11-16 DIAGNOSIS — S129XXS Fracture of neck, unspecified, sequela: Secondary | ICD-10-CM

## 2018-11-16 DIAGNOSIS — S14109S Unspecified injury at unspecified level of cervical spinal cord, sequela: Secondary | ICD-10-CM

## 2018-11-18 ENCOUNTER — Ambulatory Visit
Payer: Medicaid Other | Attending: Physical Medicine and Rehabilitation | Admitting: Rehabilitative and Restorative Service Providers"

## 2018-11-18 ENCOUNTER — Encounter: Payer: Self-pay | Admitting: Rehabilitative and Restorative Service Providers"

## 2018-11-18 ENCOUNTER — Other Ambulatory Visit: Payer: Self-pay

## 2018-11-18 ENCOUNTER — Encounter: Payer: Self-pay | Admitting: Occupational Therapy

## 2018-11-18 ENCOUNTER — Ambulatory Visit: Payer: Medicaid Other | Admitting: Occupational Therapy

## 2018-11-18 DIAGNOSIS — M25611 Stiffness of right shoulder, not elsewhere classified: Secondary | ICD-10-CM

## 2018-11-18 DIAGNOSIS — G8222 Paraplegia, incomplete: Secondary | ICD-10-CM | POA: Insufficient documentation

## 2018-11-18 DIAGNOSIS — M25612 Stiffness of left shoulder, not elsewhere classified: Secondary | ICD-10-CM | POA: Insufficient documentation

## 2018-11-18 DIAGNOSIS — M6281 Muscle weakness (generalized): Secondary | ICD-10-CM | POA: Diagnosis not present

## 2018-11-18 DIAGNOSIS — R209 Unspecified disturbances of skin sensation: Secondary | ICD-10-CM | POA: Diagnosis present

## 2018-11-18 DIAGNOSIS — R278 Other lack of coordination: Secondary | ICD-10-CM | POA: Insufficient documentation

## 2018-11-18 DIAGNOSIS — R29818 Other symptoms and signs involving the nervous system: Secondary | ICD-10-CM

## 2018-11-18 DIAGNOSIS — M25632 Stiffness of left wrist, not elsewhere classified: Secondary | ICD-10-CM | POA: Insufficient documentation

## 2018-11-18 DIAGNOSIS — R208 Other disturbances of skin sensation: Secondary | ICD-10-CM

## 2018-11-18 DIAGNOSIS — M542 Cervicalgia: Secondary | ICD-10-CM | POA: Insufficient documentation

## 2018-11-18 DIAGNOSIS — M25631 Stiffness of right wrist, not elsewhere classified: Secondary | ICD-10-CM | POA: Insufficient documentation

## 2018-11-18 DIAGNOSIS — R293 Abnormal posture: Secondary | ICD-10-CM | POA: Diagnosis present

## 2018-11-18 NOTE — Therapy (Signed)
[facility] [address] [place], [place], [address] Phone: [phone]   Fax:  [phone]  Occupational Therapy Treatment  Patient Details  Name: Harry CandyCarlos [name] MRN: [id number] Date of Birth: 01/31/1966 Referring Provider (OT): Dr. Alois Cliche'Brien   Encounter Date: 11/18/2018  OT End of Session - 11/18/18 1223    Visit Number  3    Number of Visits  13    Date for OT Re-Evaluation  01/28/19    Authorization Type  Medicaid    Authorization Time Period  12 visits  through 01/27/2019    Authorization - Visit Number  3    Authorization - Number of Visits  12    OT Start Time  0845    OT Stop Time  0930    OT Time Calculation (min)  45 min    Activity Tolerance  Patient tolerated treatment well       Past Medical History:  Diagnosis Date  . Diabetes mellitus without complication (HCC)     History reviewed. No pertinent surgical history.  There were no vitals filed for this visit.  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 Pt was d/c home with family on 09/24/2018.  Pt's PMH is significant for: T2DM, recent LE DVT    Limitations  cleared for AA/ROM, P/ROM and strengthening to RUE, (pt was originally NWB, will plan to perform WB to tolerance as pt is now greater than 12 weeks post injury and cleared for strengthening.    Patient Stated Goals  get better    Currently in Pain?  Yes    Pain Score  5     Pain Location  Shoulder    Pain Orientation  Right    Pain Descriptors / Indicators  Sharp;Sore    Pain Type  Acute pain    Pain Onset  More than a month ago    Pain Frequency  Intermittent    Aggravating Factors   RUE ER, extension, when lowering arm from overhead reach, wrist extension    Pain  Relieving Factors  pt with less pain at end of session.                   OT Treatments/Exercises (OP) - 11/18/18 1207      ADLs   Functional Mobility  Throughout session functionally incorporated rolling to the L, rolling from L side to back, shifting weight in long sitting to assist with moving LE's from long sitting to sitting EOM.  Pt needed max assist to roll to the left, and only positioning with RLE to roll from L to back.  Pt needed step by step cueing for hand placement and to initiate weight shift for moving from long sitting to EOM ( max a x2).  Also addressed sliding board transfers from mat to chair (moving to pt's right) - pt needs max cues for weight shifting and hand placement as well as max a x2.        Neck Exercises: Seated   Other Seated Exercise  Transitioned into long sitting and addressed forward reach to toes - pt reports he is unable to do this at home because in his bed he can't sit the head of  the bed up all the way because he slides in the bed and it causes neck pain.  Initially pt only able to reach to knees with assist howver after 10 reps pt could touch feet. Pt able to control bending forward with close supervision however needs min a to sit back up.  WIth cues and close supevision pt able to propr elbows on LE's in long sitting.       Shoulder Exercises: Sidelying   Other Sidelying Exercises  Using ball, addressed shoulder flexixon with RUE for overhead reach in sidelying  - initially needed mod facilitation due to decrease scap mobility however with some improved in shoulder girdle alignment pt able to complete with cues only.  Also addressed scapular depression with holding against resistance at end of range in sidelying.  Transitoined back to supine and pt with no pain with shoulder flexion  and extension in this position.        Manual Therapy   Manual Therapy  Soft tissue mobilization;Scapular mobilization    Manual therapy comments  soft tissue and  scap mob to R shoulder girdle in supine and sidelying - pt with extremely limited scapular movement contributing to R shoulder pain and decreased RUE movement.  Pt with some improvement in scapular mobilization and with decreased pain for shoulder flexion by end of session. Pt also demonstrated some improvement in ER and shoulder extension.               OT Education - 11/18/18 1220    Education Details  Pt given hand flexion /extension and asked to complete several times per day. Pt is not currently using is UE's for very much and hands are very stiff.  Will address functional activity next session.    Person(s) Educated  Patient    Methods  Explanation;Demonstration    Comprehension  Verbalized understanding;Returned demonstration       OT Short Term Goals - 11/18/18 1221      OT SHORT TERM GOAL #1   Title  I with inital HEP- 12/14/2018    Baseline  dependent    Time  6    Period  Weeks    Status  On-going    Target Date  12/14/18      OT SHORT TERM GOAL #2   Title  Pt will donn shirt with min A    Baseline  mod A    Time  6    Period  Weeks    Status  On-going      OT SHORT TERM GOAL #3   Title  Pt will demonstrate bilateral wrist flexion/ extension WFLs for ADLs/ IADLS.    Baseline  45/45 wrist flexion/ extension bilateral UE's    Time  6    Period  Weeks    Status  On-going      OT SHORT TERM GOAL #4   Title  Pt will perform LB dressing with mod A    Baseline  dependent    Time  6    Period  Weeks    Status  On-going      OT SHORT TERM GOAL #5   Title  Pt will perform UB bathing with Min A, and LB bathing with mod A    Baseline  UB mod A, LB total A in bed    Time  6    Period  Weeks    Status  On-going      OT SHORT TERM GOAL #6  Title  Pt will demonstrate ability to retireve a lightweight object at 110 shoulder flexion for RUE for increased ease with ADLs.    Baseline  95* shoulder flexion    Time  6    Period  Weeks    Status  On-going      OT  SHORT TERM GOAL #7   Title  Pt will demonstrate ability to retrieve a lightweight object at 115* shoulder flexion  with LUE for increased ease with ADLs,    Baseline  105*    Time  6    Period  Weeks    Status  On-going        OT Long Term Goals - 11/18/18 1222      OT LONG TERM GOAL #1   Title  Pt will donn shirt with setup-01/28/2019    Baseline  mod A    Time  12    Period  Weeks    Status  On-going      OT LONG TERM GOAL #2   Title  Pt will perform LB dressing with min A    Baseline  dependent/ total A    Time  12    Period  Weeks    Status  On-going      OT LONG TERM GOAL #3   Title  Pt will transfer to [facility] with mod A using sliding board    Baseline  total A +2 for basic transfers    Time  12    Period  Weeks    Status  On-going      OT LONG TERM GOAL #4   Title  Pt and family will verbalize understanding regarding recommendations for shower chair, DME    Baseline  dependent, pt does not yet have shower chair, currently bathing in bed    Time  12    Period  Weeks    Status  On-going      OT LONG TERM GOAL #5   Title  Pt will increase bilateral grip strength by 10 lbs for increased ease with ADLs/ IADLS.    Baseline  RUE grip 5 lbs, LUE 3 lbs    Time  12    Period  Weeks    Status  On-going            Plan - 11/18/18 1222    Clinical Impression Statement  Pt progressing toward goals. Pt with decreased pain and improved ROM for RUE with overhead reach by end of session.    OT Occupational Profile and History  Detailed Assessment- Review of Records and additional review of physical, cognitive, psychosocial history related to current functional performance    Occupational performance deficits (Please refer to evaluation for details):  ADL's;IADL's;Work;Leisure;Social Participation    Body Structure / Function / Physical Skills  ADL;UE functional use;Endurance;Balance;Flexibility;Pain;FMC;ROM;GMC;Coordination;Sensation;Decreased knowledge of  precautions;Decreased knowledge of use of DME;IADL;Strength;Dexterity;Mobility;Tone    Rehab Potential  Good    Clinical Decision Making  Several treatment options, min-mod task modification necessary    Comorbidities Affecting Occupational Performance:  May have comorbidities impacting occupational performance    OT Frequency  1x / week    OT Duration  12 weeks    OT Treatment/Interventions  Self-care/ADL training;Ultrasound;Energy conservation;Aquatic Therapy;DME and/or AE instruction;Patient/family education;Paraffin;Gait Training;Passive range of motion;Balance training;Fluidtherapy;Cryotherapy;Splinting;Building services engineer;Contrast Bath;Electrical Stimulation;Moist Heat;Therapeutic exercise;Manual Therapy;Therapeutic activities;Cognitive remediation/compensation;Neuromuscular education    Plan  progress HEP for R wrist as able, shoulder and scapular ROM, address R shoulder pain,  consider long  sit when able for ADLs, incorporate functional use of UE's at home    Consulted and Agree with Plan of Care  Patient;Family member/caregiver    Family Member Consulted  wife,interpreter via video       Patient will benefit from skilled therapeutic intervention in order to improve the following deficits and impairments:   Body Structure / Function / Physical Skills: ADL, UE functional use, Endurance, Balance, Flexibility, Pain, FMC, ROM, GMC, Coordination, Sensation, Decreased knowledge of precautions, Decreased knowledge of use of DME, IADL, Strength, Dexterity, Mobility, Tone       Visit Diagnosis: Muscle weakness (generalized)  Abnormal posture  Other symptoms and signs involving the nervous system  Stiffness of right shoulder, not elsewhere classified  Stiffness of left shoulder, not elsewhere classified  Other disturbances of skin sensation  Stiffness of right wrist, not elsewhere classified  Stiffness of left wrist, not elsewhere classified  Other lack of  coordination    Problem List There are no active problems to display for this patient.   Norton Pastel, OTR/L 11/18/2018, 12:25 PM  [facility] [address] [place], [place], [address] Phone: [phone]   Fax:  [phone]  Name: [name] MRN: [id number] Date of Birth: 07-03-1965

## 2018-11-18 NOTE — Therapy (Signed)
[facility] [address] [place], [place], [address] Phone: [phone]   Fax:  [phone]  Physical Therapy Treatment  Patient Details  Name: [name] MRN: [id number] Date of Birth: February 18, 1965 Referring Provider (PT): Lanny Cramp, MD - referring physician.  Patient to have care transferred to [facility].  New PCP: Grayce Sessions, NP   Encounter Date: 11/18/2018  PT End of Session - 11/18/18 0911    Visit Number  5    Number of Visits  16   eval + initial 3 visits from [facility]   Authorization Type  Medicaid    Authorization Time Period  11/18/18-02/09/19; 12 visits    Authorization - Visit Number  1    Authorization - Number of Visits  12    PT Start Time  0803    PT Stop Time  0845    PT Time Calculation (min)  42 min    Activity Tolerance  Patient limited by pain    Behavior During Therapy  [facility] for tasks assessed/performed       Past Medical History:  Diagnosis Date  . Diabetes mellitus without complication (HCC)     History reviewed. No pertinent surgical history.  There were no vitals filed for this visit.  Subjective Assessment - 11/18/18 0851    Subjective  The patient was seen at ED for blisters on heels and his wife is changing dressing each day.  He continues with intermittent pain in R wrist and R shoulder.    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    Currently in Pain?  Yes    Pain Score  --   none at rest, however PT monitored t/o session.   Pain Location  --   wrist and shoulder   Pain Orientation  Right    Pain Onset  More than a month ago    Pain Frequency  Intermittent    Aggravating Factors   weight bearing, shoulder ER, and  wrist extension    Pain Relieving Factors  repositioning    Effect of Pain on Daily Activities  Patient has discomfort during UE rocking to initiate rolling in R shoulder                       [facility] Adult PT Treatment/Exercise - 11/18/18 0900      Bed Mobility   Bed Mobility  Sit to Supine;Rolling Left;Rolling Right    Rolling Right  Maximal Assistance - Patient 25-49%   Cues for arm momentum to initiate swing   Rolling Left  Maximal Assistance - Patient 25-49%   cues for arm swing for momentum to initiate swing   Sit to Supine  2 Helpers   patient moved sit>R elbow prop > R sidelying>supine     Transfers   Transfers  Lateral/Scoot Transfers    Lateral/Scoot Transfers  1: +1 Total assist   had stand by assist of 2nd therapist for safety   Lateral Transfers: Patient Percentage  10%    Lateral/Scoot Transfer Details (indicate cue type and reason)  PT had patient reach to ground over R arm rest and placed the sliding board under L gluts.  Patient pushed up  to sitting with CGA.  Manual cues to lean anteriorly to initiate sliding board transfer.  Performed 3-4 scoots along board with full assist to mat.  Patient cued to lean to L elbow prop with min A and PT removed sliding board from under R side.        Therapeutic Activites    Therapeutic Activities  Other Therapeutic Activities    Other Therapeutic Activities  Worked on rolling from L sidelying to continue onto prone on elbows.  PT demonstrated, provided verbal cues and manual assist to roll from L sidelying onto prone.  Patient needs mod to max A to get L elbow under his shoulder.  Maintained position for bilateral shoulder weight bearing.  Performed lateral weight shifting in prone bilateral elbow propping to begin to manage weight through upper body and shoulder weight shifting.  Session focused on rolling, and loading UEs for functional mobility.      Exercises   Exercises  Other Exercises    Other Exercises   Scapular  protraction/retraction x 8 reps in prone, anti-gravity neck movement stabilizing while in prone on elbows position.  Performed shifting elbow forward backward on right side, then left side x 3 reps each.               PT Short Term Goals - 11/05/18 2215      PT SHORT TERM GOAL #1   Title  Patient will direct initial HEP independently to wife/caregivers for stretching LEs in order to prevent risk of contractures.    Baseline  have initiated cervical and shoulder ROM HEP; have not initiated stretching for LE    Time  6    Period  Weeks   visits   Status  Revised    Target Date  12/20/18      PT SHORT TERM GOAL #2   Title  Patient will perform sitting balance with A/P weight shifting with mod A in order to prepare for sliding board lateral transfers from mat <> w/c.    Baseline  total A in long sitting; short sitting can require as little as mod A but + 2 for safety    Time  6    Period  Weeks    Status  Revised    Target Date  12/20/18      PT SHORT TERM GOAL #3   Title  Patient will perform rolling right and left in bed with mod A in order to decrease caregiver burden and for pressure relief.    Baseline  total A    Time  6    Period  Weeks    Status  Revised    Target Date  12/20/18      PT SHORT TERM GOAL #4   Title  Patient will perform scoot pivot t/f on sliding board from w/c <> mat table (from one surface elevated with gravity assisting) with max A.    Baseline  +2 assistance - just cleared to WB through R wrist to assist with transfers    Time  6    Period  Weeks    Status  Revised    Target Date  12/20/18        PT Long Term Goals - 11/05/18 2216      PT LONG TERM GOAL #1   Title  Patient will direct final HEP independently to wife/caregivers for stretching LEs in order to prevent risk of contractures. ALL LTGS DUE BY 12TH VISIT    Baseline  dependent    Time  12    Period  --   visits   Status  Revised    Target Date  02/03/19      PT LONG TERM  GOAL #2   Title  When pt receives his own w/c (vs. loaner chair) pt will demonstrate independence with w/c parts in order to safely prepare for transfers.    Baseline  not yet assessed    Time  12    Period  Weeks    Status  Revised    Target Date  02/03/19      PT LONG TERM GOAL #3   Title  Patient will perform sitting balance with A/P and M/L weight shifting with supervision/min A in order to perform sliding board lateral transfers from mat <> w/c.    Baseline  currently max A for dynamic sitting balance    Time  12    Period  Weeks    Status  Revised    Target Date  02/03/19      PT LONG TERM GOAL #4   Title  Patient will perform rolling right and left in bed with min A in order to decrease caregiver burden and for pressure relief.    Baseline  Total A    Time  12    Period  Weeks    Status  Revised    Target Date  02/03/19      PT LONG TERM GOAL #5   Title  Patient will perform scoot pivot t/f on sliding board from w/c <> mat table on level surface with min-mod A.    Baseline  total A x2 with gravity assisting transfer    Time  12    Period  Weeks    Status  Revised    Target Date  02/03/19            Plan - 11/18/18 0912    Clinical Impression Statement  The patient was able to bear weight in UEs without increasing R shoulder pain.  He does c/o pain when R arm in ER or horizontally abducted position.  The patient has tightness in hip rotators which may limit circle sitting *may need to check home stretching to ensure rotators on being addressed.  The patient tolerated session well today.    PT Treatment/Interventions  ADLs/Self Care Home Management;DME Instruction;Functional mobility training;Therapeutic activities;Therapeutic exercise;Balance training;Neuromuscular re-education;Wheelchair mobility training;Patient/family education;Passive range of motion;Energy conservation;Electrical Stimulation;Orthotic Fit/Training;Manual techniques    PT Next Visit Plan  how are  neck exercises going?  add to HEP: LLE stretches, rolling with UE momentum; pushing up to sitting - long sitting, circle sitting.  follow up with a physiatrist here in town? (Dr. Hermelinda Medicus). Sitting balance A/P and M/L with BLE on step, teaching wife initial HEP for home stretching program to decrease risk of contractures.    Consulted and Agree with Plan of Care  Patient;Family member/caregiver    Family Member Consulted  wife, Byrd Hesselbach       Patient will benefit from skilled therapeutic intervention in order to improve the following deficits and impairments:  Decreased activity tolerance, Decreased balance, Decreased mobility, Decreased endurance, Decreased range of motion, Decreased strength, Impaired flexibility, Impaired sensation, Impaired UE functional use, Pain, Impaired tone, Postural dysfunction  Visit Diagnosis: Muscle weakness (generalized)  Abnormal posture  Cervicalgia  Paraplegia, incomplete (HCC)  Other symptoms and signs involving the nervous system     Problem List There are no active problems to display  for this patient.   Calisha Tindel, PT 11/18/2018, 9:14 AM  [facility] [address] [place], [place], [address] Phone: [phone]   Fax:  [phone]  Name: Lonzo CandyCarlos Sasso MRN: [id number] Date of Birth: [birth year]

## 2018-11-25 ENCOUNTER — Encounter: Payer: Self-pay | Admitting: Rehabilitative and Restorative Service Providers"

## 2018-11-25 ENCOUNTER — Other Ambulatory Visit: Payer: Self-pay

## 2018-11-25 ENCOUNTER — Ambulatory Visit: Payer: Medicaid Other | Admitting: Occupational Therapy

## 2018-11-25 ENCOUNTER — Encounter: Payer: Self-pay | Admitting: Occupational Therapy

## 2018-11-25 ENCOUNTER — Ambulatory Visit: Payer: Medicaid Other | Admitting: Rehabilitative and Restorative Service Providers"

## 2018-11-25 DIAGNOSIS — M542 Cervicalgia: Secondary | ICD-10-CM

## 2018-11-25 DIAGNOSIS — R293 Abnormal posture: Secondary | ICD-10-CM

## 2018-11-25 DIAGNOSIS — G8222 Paraplegia, incomplete: Secondary | ICD-10-CM

## 2018-11-25 DIAGNOSIS — R278 Other lack of coordination: Secondary | ICD-10-CM

## 2018-11-25 DIAGNOSIS — R208 Other disturbances of skin sensation: Secondary | ICD-10-CM

## 2018-11-25 DIAGNOSIS — M25611 Stiffness of right shoulder, not elsewhere classified: Secondary | ICD-10-CM

## 2018-11-25 DIAGNOSIS — M6281 Muscle weakness (generalized): Secondary | ICD-10-CM

## 2018-11-25 DIAGNOSIS — M25632 Stiffness of left wrist, not elsewhere classified: Secondary | ICD-10-CM

## 2018-11-25 DIAGNOSIS — M25612 Stiffness of left shoulder, not elsewhere classified: Secondary | ICD-10-CM

## 2018-11-25 DIAGNOSIS — R29818 Other symptoms and signs involving the nervous system: Secondary | ICD-10-CM

## 2018-11-25 DIAGNOSIS — M25631 Stiffness of right wrist, not elsewhere classified: Secondary | ICD-10-CM

## 2018-11-25 NOTE — Therapy (Signed)
[facility] [address] [place], [place], [address] Phone: [phone]   Fax:  [phone]  Occupational Therapy Treatment  Patient Details  Name: [name] MRN: [id number] Date of Birth: 1965-05-23 Referring Provider (OT): Dr. Alois Cliche   Encounter Date: 11/25/2018  OT End of Session - 11/25/18 1232    Visit Number  4    Number of Visits  13    Date for OT Re-Evaluation  01/28/19    Authorization Type  Medicaid    Authorization Time Period  12 visits  through 01/27/2019    Authorization - Visit Number  4    Authorization - Number of Visits  12    OT Start Time  0846    OT Stop Time  0930    OT Time Calculation (min)  44 min       Past Medical History:  Diagnosis Date  . Diabetes mellitus without complication (HCC)     History reviewed. No pertinent surgical history.  There were no vitals filed for this visit.  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 Pt was d/c home with family on 09/24/2018.  Pt's PMH is significant for: T2DM, recent LE DVT    Limitations  cleared for AA/ROM, P/ROM and strengthening to RUE, (pt was originally NWB, will plan to perform WB to tolerance as pt is now greater than 12 weeks post injury and cleared for strengthening.    Patient Stated Goals  get better    Currently in Pain?  No/denies                   OT Treatments/Exercises (OP) - 11/25/18 0001      ADLs   Functional Mobility  Addressed sliding board transfers from chair to mat - with cues pt able to move arm rest out of way. Pt able to remove lateral guide with min a and cues.  Pt needs cues to lean forward and to the R as well as cues for hand  placement while therapist places board as well as cues to lean forward and to the right when transfering L.  Pt max a x1, min a x1.  Pt needs cues and min a to lean on L elbow to allow therapist to remove board .  Mod a to transition back into sitting.  Pt able to assist in controlling upper body for transition from EOM to supine with moderate assist for UB and total assist for LE's.  Addressed rolling L and R - pt required light min a for rolling L and min - mod a for rolling R. Rolling has improved. Also addressed long sitting and stretches (10 reps while holding 5 seconds each) with intermittent min a for balance. Also attempted partial circle sitting - pt with extremely limited flexibility and reports he is unable to stretch at home in bed as when he sits head of bed up, he feels he is sliding out of bed and has neck pain.  All activities as precursor to ADL activity.      ADL Comments  Clarified with pt whether he wanted to recieve therapy here in the clinic or at home as MD  suggested all services at home. Pt stated he is hoping Medicaid will provide an aide to assist him and his wife at home but wishes to continue his therapy in this clinic.  Pt to see primary MD tomorrow.       Shoulder Exercises: Supine   Other Supine Exercises  Pt issued begininng HEP for overhead reach with ball in supine 20 reps in am and 20 reps in pm. Pt able to return demonstrate.               OT Education - 11/25/18 1228    Education Details  HEP ffor overhead reach in supine with ball    Person(s) Educated  Patient;Spouse    Methods  Explanation;Demonstration;Handout   in PT note so pt has one HEP   Comprehension  Verbalized understanding;Returned demonstration       OT Short Term Goals - 11/18/18 1221      OT SHORT TERM GOAL #1   Title  I with inital HEP- 12/14/2018    Baseline  dependent    Time  6    Period  Weeks    Status  On-going    Target Date  12/14/18      OT SHORT TERM GOAL #2   Title  Pt  will donn shirt with min A    Baseline  mod A    Time  6    Period  Weeks    Status  On-going      OT SHORT TERM GOAL #3   Title  Pt will demonstrate bilateral wrist flexion/ extension WFLs for ADLs/ IADLS.    Baseline  45/45 wrist flexion/ extension bilateral UE's    Time  6    Period  Weeks    Status  On-going      OT SHORT TERM GOAL #4   Title  Pt will perform LB dressing with mod A    Baseline  dependent    Time  6    Period  Weeks    Status  On-going      OT SHORT TERM GOAL #5   Title  Pt will perform UB bathing with Min A, and LB bathing with mod A    Baseline  UB mod A, LB total A in bed    Time  6    Period  Weeks    Status  On-going      OT SHORT TERM GOAL #6   Title  Pt will demonstrate ability to retireve a lightweight object at 110 shoulder flexion for RUE for increased ease with ADLs.    Baseline  95* shoulder flexion    Time  6    Period  Weeks    Status  On-going      OT SHORT TERM GOAL #7   Title  Pt will demonstrate ability to retrieve a lightweight object at 115* shoulder flexion  with LUE for increased ease with ADLs,    Baseline  105*    Time  6    Period  Weeks    Status  On-going        OT Long Term Goals - 11/18/18 1222      OT LONG TERM GOAL #1   Title  Pt will donn shirt with setup-01/28/2019    Baseline  mod A    Time  12    Period  Weeks    Status  On-going      OT LONG TERM GOAL #2  Title  Pt will perform LB dressing with min A    Baseline  dependent/ total A    Time  12    Period  Weeks    Status  On-going      OT LONG TERM GOAL #3   Title  [facility] with mod A using sliding board    Baseline  total A +2 for basic transfers    Time  12    Period  Weeks    Status  On-going      OT LONG TERM GOAL #4   Title  Pt and family will verbalize understanding regarding recommendations for shower chair, DME    Baseline  dependent, pt does not yet have shower chair, currently bathing in bed    Time  12    Period   Weeks    Status  On-going      OT LONG TERM GOAL #5   Title  Pt will increase bilateral grip strength by 10 lbs for increased ease with ADLs/ IADLS.    Baseline  RUE grip 5 lbs, LUE 3 lbs    Time  12    Period  Weeks    Status  On-going            Plan - 11/25/18 1229    Clinical Impression Statement  Pt progressing toward goals. Pt with improving UE ROM as well as improving rolling    OT Occupational Profile and History  Detailed Assessment- Review of Records and additional review of physical, cognitive, psychosocial history related to current functional performance    Body Structure / Function / Physical Skills  ADL;UE functional use;Endurance;Balance;Flexibility;Pain;FMC;ROM;GMC;Coordination;Sensation;Decreased knowledge of precautions;Decreased knowledge of use of DME;IADL;Strength;Dexterity;Mobility;Tone    Rehab Potential  Good    Clinical Decision Making  Several treatment options, min-mod task modification necessary    Comorbidities Affecting Occupational Performance:  May have comorbidities impacting occupational performance    Modification or Assistance to Complete Evaluation   No modification of tasks or assist necessary to complete eval    OT Frequency  1x / week    OT Duration  12 weeks    OT Treatment/Interventions  Self-care/ADL training;Ultrasound;Energy conservation;Aquatic Therapy;DME and/or AE instruction;Patient/family education;Paraffin;Gait Training;Passive range of motion;Balance training;Fluidtherapy;Cryotherapy;Splinting;Building services engineerunctional Mobility Training;Contrast Bath;Electrical Stimulation;Moist Heat;Therapeutic exercise;Manual Therapy;Therapeutic activities;Cognitive remediation/compensation;Neuromuscular education    Plan  progress HEP for R wrist and UE strengthening as able, shoulder and scapular ROM, address R shoulder pain, address long sit when able for ADLs, incorporate functional use of UE's at home, functional mobility    Consulted and Agree with Plan of  Care  Patient    Family Member Consulted  wife,interpreter via video       Patient will benefit from skilled therapeutic intervention in order to improve the following deficits and impairments:   Body Structure / Function / Physical Skills: ADL, UE functional use, Endurance, Balance, Flexibility, Pain, FMC, ROM, GMC, Coordination, Sensation, Decreased knowledge of precautions, Decreased knowledge of use of DME, IADL, Strength, Dexterity, Mobility, Tone       Visit Diagnosis: Muscle weakness (generalized)  Abnormal posture  Other symptoms and signs involving the nervous system  Stiffness of right shoulder, not elsewhere classified  Stiffness of left shoulder, not elsewhere classified  Other disturbances of skin sensation  Stiffness of right wrist, not elsewhere classified  Stiffness of left wrist, not elsewhere classified  Other lack of coordination    Problem List There are no active problems to display for this patient.  Norton Pastel, OTR/L 11/25/2018, 12:34 PM  [facility] [address] [place], [place], [address] Phone: [phone]   Fax:  [phone]  Name: [name] MRN: [id number] Date of Birth: 01-09-1966

## 2018-11-25 NOTE — Patient Instructions (Signed)
Access Code: [email or web address]  URL: [email or web address]  Date: 11/25/2018  Prepared by: Rudell Cobb   Exercises Supine Shoulder Flexion AAROM - 10 reps - 1 sets - 1x daily - 7x weekly Supine Hip Adductor Stretch - 3 reps - 1 sets - 30 seconds hold - 1x daily - 7x weekly Supine Ankle Dorsiflexion Stretch with Caregiver - 3 reps - 1 sets - 30 seconds hold - 1x daily - 7x weekly

## 2018-11-25 NOTE — Therapy (Signed)
[facility] [address] [place], [place], [address] Phone: [phone]   Fax:  [phone]  Physical Therapy Treatment  Patient Details  Name: [name] MRN: [id number] Date of Birth: 01-26-1966 Referring Provider (PT): Doran Clay, MD - referring physician.  Patient to have care transferred to [facility].  New PCP: Kerin Perna, NP   Encounter Date: 11/25/2018  PT End of Session - 11/25/18 1216    Visit Number  6    Number of Visits  16   eval + initial 3 visits from Medicaid   Authorization Type  Medicaid    Authorization Time Period  11/18/18-02/09/19; 12 visits    Authorization - Visit Number  2    Authorization - Number of Visits  12    PT Start Time  0930    PT Stop Time  1018    PT Time Calculation (min)  48 min    Activity Tolerance  Patient limited by pain    Behavior During Therapy  [facility] for tasks assessed/performed       Past Medical History:  Diagnosis Date  . Diabetes mellitus without complication ([place])     History reviewed. No pertinent surgical history.  There were no vitals filed for this visit.  Subjective Assessment - 11/25/18 0931    Subjective  Patient is not performing HEP due to limitations with  home hospital bed and wife's time limitations.    Patient Stated Goals  wants to do exercises as soon as he can, wants to be able to feel his legs again if he can.    Currently in Pain?  No/denies   no pain at rest; modified activities when R shoulder painful                      [facility] Adult PT Treatment/Exercise - 11/25/18 1221      Bed Mobility   Bed Mobility  Rolling Left;Sit to Supine;Supine to Sit;Sitting - Scoot to Edge of Bed    Rolling Right  2 Helpers;Maximal Assistance - Patient 25-49%    Rolling Left  2 Helpers;Maximal Assistance - Patient 25-49%   Worked on left roll x 4 reps attempting various hand positio   Supine to Sit  2 Helpers    Sitting -  Scoot to [employer] of Bed  2 Helpers;Maximal Assistance - Patient 25-49%   long sitting, used elbow propping  + scooting to get to edge   Sit to Supine  2 Helpers      Transfers   Transfers  Lateral/Scoot Transfers    Lateral/Scoot Transfers  2: Max assist;1: +2 Total assist    Lateral Transfers: Patient Percentage  10%    Lateral/Scoot Transfer Details (indicate cue type and reason)  The patient was able to move the sliding board towards his right hip, then needed assist to move onto L elbow and help to place sliding board.      Transfer Cueing  Cues for head/hips relationship.      Therapeutic Activites    Therapeutic Activities  Other Therapeutic Activities    Other Therapeutic Activities  At beginning of session, patient supine after OT.  Worked on initiating L rolling with UE momentum.  Rolled to prone on elbows with mod A (+2 available t/o session for assistance).   Worked on lateral weight shift in prone through elbows to be able to move L elbow forwards/backwards.  R UE harder to move with pain reported in  R shoulder.  Rolled to prone 2 times during session.  Worked on elbow propping while in long sitting leaning L lateral with max A to return to upright and R lateral.  Performed reaching tasks while in lateral elbow propping with contralateral UE.  Performed Circle sitting with full assist to obtain position and worked on reaching for ankles leaning anteriorly (needs max assist posterior to trunk due to hip tightness and dec'd balance).  Attempted posterior elbow propping from long sitting with max A.  Patient does not yet have ROM available to get into this position.  Worked on transition from long sitting to L elbow propping to supine wiht max assist.   To move to edge of mat to transfer mat>w/c required +2 assist.  PT moved LEs from long sitting to sitting edge of mat and worked on patient laterally leaning to elbows in order to slide hips anteriorly.        Exercises   Exercises  Other  Exercises    Other Exercises   Focused on initiating HEP that would take < 10 minutes/day at this time.  Provided handout for hip adductor stretch passively supine, heel cord stretch, and also included UE movement that OT had reviewed in images.    PRONE:  scapular protation/retraction x 10 reps, supine ER/IR of R shoulder with arm positioned at neutral, supine "T" stretch with pillow under R arm for modification, SIDELYING:  R shoulder flexion with scapular protraction/retraction with R wrist supported on ball.             PT Education - 11/25/18 1215    Education Details  discussed need for stretching daily focusing on beginning with 3 exercises to build into his routine; his son has 10 minutes/day to help and PT recommended that this may be a good use of his time when with his father; the patinet's wife to teach the son how to assist.    Person(s) Educated  Patient;Spouse    Methods  Explanation;Demonstration;Handout    Comprehension  Verbalized understanding;Returned demonstration       PT Short Term Goals - 11/05/18 2215      PT SHORT TERM GOAL #1   Title  Patient will direct initial HEP independently to wife/caregivers for stretching LEs in order to prevent risk of contractures.    Baseline  have initiated cervical and shoulder ROM HEP; have not initiated stretching for LE    Time  6    Period  Weeks   visits   Status  Revised    Target Date  12/20/18      PT SHORT TERM GOAL #2   Title  Patient will perform sitting balance with A/P weight shifting with mod A in order to prepare for sliding board lateral transfers from mat <> w/c.    Baseline  total A in long sitting; short sitting can require as little as mod A but + 2 for safety    Time  6    Period  Weeks    Status  Revised    Target Date  12/20/18      PT SHORT TERM GOAL #3   Title  Patient will perform rolling right and left in bed with mod A in order to decrease caregiver burden and for pressure relief.    Baseline   total A    Time  6    Period  Weeks    Status  Revised    Target Date  12/20/18  PT SHORT TERM GOAL #4   Title  Patient will perform scoot pivot t/f on sliding board from w/c <> mat table (from one surface elevated with gravity assisting) with max A.    Baseline  +2 assistance - just cleared to WB through R wrist to assist with transfers    Time  6    Period  Weeks    Status  Revised    Target Date  12/20/18        PT Long Term Goals - 11/05/18 2216      PT LONG TERM GOAL #1   Title  Patient will direct final HEP independently to wife/caregivers for stretching LEs in order to prevent risk of contractures. ALL LTGS DUE BY 12TH VISIT    Baseline  dependent    Time  12    Period  --   visits   Status  Revised    Target Date  02/03/19      PT LONG TERM GOAL #2   Title  When pt receives his own w/c (vs. loaner chair) pt will demonstrate independence with w/c parts in order to safely prepare for transfers.    Baseline  not yet assessed    Time  12    Period  Weeks    Status  Revised    Target Date  02/03/19      PT LONG TERM GOAL #3   Title  Patient will perform sitting balance with A/P and M/L weight shifting with supervision/min A in order to perform sliding board lateral transfers from mat <> w/c.    Baseline  currently max A for dynamic sitting balance    Time  12    Period  Weeks    Status  Revised    Target Date  02/03/19      PT LONG TERM GOAL #4   Title  Patient will perform rolling right and left in bed with min A in order to decrease caregiver burden and for pressure relief.    Baseline  Total A    Time  12    Period  Weeks    Status  Revised    Target Date  02/03/19      PT LONG TERM GOAL #5   Title  Patient will perform scoot pivot t/f on sliding board from w/c <> mat table on level surface with min-mod A.    Baseline  total A x2 with gravity assisting transfer    Time  12    Period  Weeks    Status  Revised    Target Date  02/03/19             Plan - 11/25/18 1703    Clinical Impression Statement  The patient reports feeling looser throughout his body after PT session.  He reported the prone position feels good to him.  He does get R shoulder pain when attempting to move his R UE forwards/backwards in prone, with ER of the R shoulder and occasionally during reaching tasks.  PT modified activities based on patient feedback.  The patient also has significant hip and leg tightness limiting functional sitting positions.  PT added HEP and discussed importance for ongoing progress.  Plan to continue working to STGs/LTGs.  Patient is tolerating treatment well.    PT Treatment/Interventions  ADLs/Self Care Home Management;DME Instruction;Functional mobility training;Therapeutic activities;Therapeutic exercise;Balance training;Neuromuscular re-education;Wheelchair mobility training;Patient/family education;Passive range of motion;Energy conservation;Electrical Stimulation;Orthotic Fit/Training;Manual techniques    PT Next Visit Plan  Check HEP: LLE stretches, rolling with UE momentum; pushing up to sitting - long sitting, circle sitting. Sitting balance A/P and M/L, range of motion hips and shoulders, UE strengthening.    Consulted and Agree with Plan of Care  Patient;Family member/caregiver       Patient will benefit from skilled therapeutic intervention in order to improve the following deficits and impairments:  Decreased activity tolerance, Decreased balance, Decreased mobility, Decreased endurance, Decreased range of motion, Decreased strength, Impaired flexibility, Impaired sensation, Impaired UE functional use, Pain, Impaired tone, Postural dysfunction  Visit Diagnosis: Muscle weakness (generalized)  Abnormal posture  Cervicalgia  Paraplegia, incomplete (HCC)     Problem List There are no active problems to display for this patient.   Kaelea Gathright 11/25/2018, 5:07 PM  [facility] [address] [place], [place], [address] Phone: [phone]   Fax:  [phone]  Name: [name] MRN: [id number] Date of Birth: 06/29/1965

## 2018-11-26 ENCOUNTER — Ambulatory Visit (INDEPENDENT_AMBULATORY_CARE_PROVIDER_SITE_OTHER): Payer: Medicaid Other | Admitting: Primary Care

## 2018-11-26 ENCOUNTER — Encounter (INDEPENDENT_AMBULATORY_CARE_PROVIDER_SITE_OTHER): Payer: Self-pay | Admitting: Primary Care

## 2018-11-26 VITALS — BP 91/53 | HR 59 | Temp 97.5°F

## 2018-11-26 DIAGNOSIS — S129XXS Fracture of neck, unspecified, sequela: Secondary | ICD-10-CM

## 2018-11-26 DIAGNOSIS — E1169 Type 2 diabetes mellitus with other specified complication: Secondary | ICD-10-CM

## 2018-11-26 DIAGNOSIS — S14109S Unspecified injury at unspecified level of cervical spinal cord, sequela: Secondary | ICD-10-CM

## 2018-11-26 DIAGNOSIS — L98491 Non-pressure chronic ulcer of skin of other sites limited to breakdown of skin: Secondary | ICD-10-CM

## 2018-11-26 DIAGNOSIS — R39198 Other difficulties with micturition: Secondary | ICD-10-CM | POA: Diagnosis not present

## 2018-11-26 DIAGNOSIS — B351 Tinea unguium: Secondary | ICD-10-CM | POA: Diagnosis not present

## 2018-11-26 DIAGNOSIS — S90822A Blister (nonthermal), left foot, initial encounter: Secondary | ICD-10-CM

## 2018-11-26 DIAGNOSIS — Z131 Encounter for screening for diabetes mellitus: Secondary | ICD-10-CM

## 2018-11-26 DIAGNOSIS — Z23 Encounter for immunization: Secondary | ICD-10-CM

## 2018-11-26 DIAGNOSIS — S90821A Blister (nonthermal), right foot, initial encounter: Secondary | ICD-10-CM | POA: Diagnosis not present

## 2018-11-26 DIAGNOSIS — R7303 Prediabetes: Secondary | ICD-10-CM

## 2018-11-26 MED ORDER — BUTENAFINE HCL 1 % EX CREA: TOPICAL_CREAM | CUTANEOUS | 1 refills | Status: DC

## 2018-11-26 NOTE — Patient Instructions (Signed)
Plan de alimentacin para personas con prediabetes Prediabetes Eating Plan La prediabetes es una afeccin que hace que los niveles de azcar en la sangre (glucosa) sean ms altos de lo normal. Esto aumenta el riesgo de tener diabetes. Para prevenir la diabetes, es posible que su mdico le recomiende cambios en la dieta y otros cambios en su estilo de vida que lo ayuden a lograr lo siguiente:  Controlar los niveles de glucemia.  Mejorar los niveles de colesterol.  Controlar la presin arterial. El mdico puede recomendarle que trabaje con un especialista en alimentacin y nutricin (nutricionista) para elaborar el plan de comidas ms conveniente para usted. Consejos para seguir este plan: Estilo de vida  Establezca metas para bajar de peso con la ayuda de su equipo de atencin mdica. A la mayora de las personas con prediabetes se les recomienda bajar un 7% de su peso corporal.  Haga ejercicio al menos 30minutos 5das por semana, como mnimo.  Asista a un grupo de apoyo o solicite el apoyo continuo de un consejero de salud mental.  Tome los medicamentos de venta libre y los recetados solamente como se lo haya indicado el mdico. Leer las etiquetas de los alimentos  Lea las etiquetas de los alimentos envasados para controlar la cantidad de grasa, sal (sodio) y azcar que contienen. Evite los alimentos que contengan lo siguiente: ? Grasas saturadas. ? Grasas trans. ? Azcares agregados.  Evite los alimentos que contengan ms de 300miligramos(mg) de sodio por porcin. Limite el consumo diario de sodio a menos de 2300mg por da. De compras  Evite comprar alimentos procesados y preelaborados. Coccin  Cocine con aceite de oliva. No use mantequilla, manteca de cerdo o mantequilla clarificada.  Cocine los alimentos al horno, a la parrilla, asados o hervidos. Evite frerlos. Planificacin de las comidas   Trabaje con el nutricionista para crear un plan de alimentacin que sea  adecuado para usted. Esto puede incluir lo siguiente: ? Registro de la cantidad de caloras que ingiere. Use un registro de alimentos, un cuaderno o una aplicacin mvil para anotar lo que comi en cada comida. ? Uso del ndice glucmico (IG) para planificar las comidas. El ndice muestra con qu rapidez elevar la glucemia un alimento. Elija alimentos con bajo IG. Estos demoran ms en elevar la glucemia.  Considere la posibilidad de seguir una dieta mediterrnea. Esta dieta incluye lo siguiente: ? Varias porciones de frutas y verduras frescas por da. ? Pescado al menos dos veces por semana. ? Varias porciones de cereales integrales, frijoles, frutos secos y semillas por da. ? Aceite de oliva en lugar de otras grasas. ? Consumo moderado de alcohol. ? Pequeas cantidades de carnes rojas y lcteos enteros.  Si tiene hipertensin arterial, quizs deba limitar el consumo de sodio o seguir una dieta como el plan de alimentacin basado en Enfoques Alimentarios para Detener la Hipertensin (Dietary Approaches to Stop Hypertension, DASH). Este es un plan de alimentacin cuyo objetivo es bajar la hipertensin arterial. Qu alimentos se recomiendan? Es posible que los alimentos incluidos a continuacin no constituyan la lista completa. Hable con el nutricionista sobre las mejores opciones alimenticias para usted. Cereales Productos integrales, como panes, galletas, cereales y pastas de salvado o integrales. Avena sin azcar. Trigo burgol. Cebada. Quinua. Arroz integral. Tacos o tortillas de harina de maz o de salvado. Verduras Lechuga. Espinaca. Guisantes. Remolachas. Coliflor. Repollo. Brcoli. Zanahorias. Tomates. Calabaza. Berenjena. Hierbas. Pimienta. Cebollas. Pepinos. Repollitos de Bruselas. Frutas Frutos rojos. Bananas. Manzanas. Naranjas. Uvas. Papaya. Mango. Granada. Kiwi. Pomelo.   Cerezas. Carnes y otros alimentos ricos en protenas Mariscos. Carne de ave sin piel. Cortes magros de cerdo y  carne de res. Tofu. Huevos. Frutos secos. Frijoles. Lcteos Productos lcteos descremados o semidescremados, como yogur, queso cottage y queso. Bebidas Agua. T. Caf. Gaseosas sin azcar o dietticas. Soda. Leche descremada o semidescremada. Productos alternativos a la leche, como leche de soja o de almendras. Grasas y aceites Aceite de oliva. Aceite de canola. Aceite de girasol. Aceite de semillas de uva. Aguacate. Nueces. Dulces y postres Pudin sin azcar o con bajo contenido de grasa. Helado y otros postres congelados sin azcar o con bajo contenido de grasa. Condimentos y otros alimentos Hierbas. Especias sin sodio. Mostaza. Salsa de pepinillos. Ktchup con bajo contenido de grasa y de azcar. Salsa barbacoa con bajo contenido de grasa y de azcar. Mayonesa con bajo contenido de grasa o sin grasa. Qu alimentos no se recomiendan? Es posible que los alimentos incluidos a continuacin no constituyan la lista completa. Hable con el nutricionista sobre las mejores opciones alimenticias para usted. Cereales Productos elaborados con harina y harina blanca refinada, como panes, pastas, bocadillos y cereales. Verduras Verduras enlatadas. Verduras congeladas con mantequilla o salsa de crema. Frutas Frutas enlatadas al almbar. Carnes y otros alimentos ricos en protenas Cortes de carne con grasa. Carne de ave con piel. Carne empanizada o frita. Carnes procesadas. Lcteos Yogur, queso o leche enteros. Bebidas Bebidas azucaradas, como t helado dulce y gaseosas. Grasas y aceites Mantequilla. Manteca de cerdo. Mantequilla clarificada. Dulces y postres Productos horneados, como pasteles, pastelitos, galletas dulces y tarta de queso. Condimentos y otros alimentos Mezclas de especias con sal agregada. Ktchup. Salsa barbacoa. Mayonesa. Resumen  Para prevenir la diabetes, es posible que deba hacer cambios en la dieta y otros cambios en su estilo de vida para ayudar a controlar el azcar en la  sangre, mejorar los niveles de colesterol y controlar la presin arterial.  Establezca metas para bajar de peso con la ayuda de su equipo de atencin mdica. A la mayora de las personas con prediabetes se les recomienda bajar un 7por ciento de su peso corporal.  Considere la posibilidad de seguir una dieta mediterrnea que incluya muchas frutas y verduras frescas, cereales integrales, frijoles, frutos secos, semillas, pescado, carnes magras, lcteos descremados y aceites saludables. Esta informacin no tiene como fin reemplazar el consejo del mdico. Asegrese de hacerle al mdico cualquier pregunta que tenga. Document Released: 10/19/2014 Document Revised: 08/12/2016 Document Reviewed: 08/12/2016 Elsevier Patient Education  2020 Elsevier Inc.  

## 2018-11-26 NOTE — Progress Notes (Signed)
 Established Patient Office Visit  Subjective:  Patient ID: [name], male    DOB: 03/14/1965  Age: 53 y.o. MRN: [id number]  CC:  Chief Complaint  Patient presents with  . Hospitalization Follow-up    Blisters on sole of left foot    Interpretor Maria # [id number]  HPI [name] presents for emergency room discharge for blisters bilateral on the heal of his feet. Also, has bilateral edema and bilateral tinea Unguium.   Past Medical History:  Diagnosis Date  . Diabetes mellitus without complication (HCC)     History reviewed. No pertinent surgical history.  History reviewed. No pertinent family history.  Social History   Socioeconomic History  . Marital status: Married    Spouse name: Not on file  . Number of children: Not on file  . Years of education: Not on file  . Highest education level: Not on file  Occupational History  . Not on file  Social Needs  . Financial resource strain: Not on file  . Food insecurity    Worry: Not on file    Inability: Not on file  . Transportation needs    Medical: Not on file    Non-medical: Not on file  Tobacco Use  . Smoking status: Never Smoker  . Smokeless tobacco: Never Used  Substance and Sexual Activity  . Alcohol use: Yes  . Drug use: No  . Sexual activity: Not on file  Lifestyle  . Physical activity    Days per week: Not on file    Minutes per session: Not on file  . Stress: Not on file  Relationships  . Social connections    Talks on phone: Not on file    Gets together: Not on file    Attends religious service: Not on file    Active member of club or organization: Not on file    Attends meetings of clubs or organizations: Not on file    Relationship status: Not on file  . Intimate partner violence    Fear of current or ex partner: Not on file    Emotionally abused: Not on file    Physically abused: Not on file    Forced sexual activity: Not on file  Other Topics Concern  . Not on file  Social  History Narrative  . Not on file    Outpatient Medications Prior to Visit  Medication Sig Dispense Refill  . apixaban (ELIQUIS) 5 MG TABS tablet Take 5 mg by mouth.    . baclofen 5 MG TABS Take 5mg every 8 hours as needed for muscle spasms 90 tablet 1  . bisacodyl (DULCOLAX) 10 MG suppository Place rectally.    . diclofenac sodium (VOLTAREN) 1 % GEL Apply topically.    . FLUoxetine (PROZAC) 10 MG capsule Take 1 capsule (10 mg total) by mouth daily. 30 capsule 3  . gabapentin (NEURONTIN) 400 MG capsule Take 1 capsule (400 mg total) by mouth 3 (three) times daily. 90 capsule 3  . oxyCODONE (OXY IR/ROXICODONE) 5 MG immediate release tablet Take 5 mg by mouth.    . senna (SENOKOT) 8.6 MG tablet Take 8.6 mg by mouth.    . simethicone (MYLICON) 80 MG chewable tablet Chew by mouth.     No facility-administered medications prior to visit.     No Known Allergies  ROS Review of Systems  Genitourinary: Positive for difficulty urinating.       In and out cath Closed fracture of cervical vertebra with spinal   cord injury,   All other systems reviewed and are negative.     Objective:    Physical Exam  Constitutional: He is oriented to person, place, and time. He appears well-developed and well-nourished.  Cardiovascular: Normal rate and regular rhythm.  Pulmonary/Chest: Effort normal and breath sounds normal.  Abdominal: Soft. Bowel sounds are normal. He exhibits distension.  Neurological: He is oriented to person, place, and time. Coordination abnormal.  Skin:  Bilateral blister  Psychiatric: He has a normal mood and affect.    BP (!) 91/53 (BP Location: Left Arm, Patient Position: Sitting, Cuff Size: Normal)   Pulse (!) 59   Temp (!) 97.5 F (36.4 C) (Temporal)   SpO2 97%  Wt Readings from Last 3 Encounters:  No data found for Wt     Health Maintenance Due  Topic Date Due  . URINE MICROALBUMIN  12/10/1977  . HIV Screening  12/11/1982  . COLONOSCOPY  12/10/2017    There  are no preventive care reminders to display for this patient.  No results found for: TSH Lab Results  Component Value Date   WBC 8.5 11/26/2018   HGB 12.5 (L) 11/26/2018   HCT 37.9 11/26/2018   MCV 85 11/26/2018   PLT 260 11/26/2018   Lab Results  Component Value Date   NA 140 11/26/2018   K 3.6 11/26/2018   CO2 22 11/26/2018   GLUCOSE 166 (H) 11/26/2018   BUN 10 11/26/2018   CREATININE 0.64 (L) 11/26/2018   BILITOT 0.2 11/26/2018   ALKPHOS 101 11/26/2018   AST 11 11/26/2018   ALT 14 11/26/2018   PROT 6.8 11/26/2018   ALBUMIN 4.0 11/26/2018   CALCIUM 9.5 11/26/2018   ANIONGAP 9 04/24/2015   Lab Results  Component Value Date   CHOL 159 11/26/2018   Lab Results  Component Value Date   HDL 43 11/26/2018   Lab Results  Component Value Date   LDLCALC 88 11/26/2018   Lab Results  Component Value Date   TRIG 164 (H) 11/26/2018   Lab Results  Component Value Date   CHOLHDL 3.7 11/26/2018   No results found for: HGBA1C    Assessment & Plan:  Tyon was seen today for hospitalization follow-up.  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  Wife is primary caregiver.  He is in outpatient rehab for generalized muscle weakness.  Need for immunization against influenza -     Flu Vaccine QUAD 36+ mos IM  Need for Tdap vaccination -     Tdap vaccine greater than or equal to 7yo IM  Screening for diabetes mellitus Patient is a prediabetic with A1c 6.0.  Discussed with patient and wife will try to maintain blood sugars less than 6.4 on a modified diet decrease carbohydrates-rice, potatoes, bread, and sweet.  Will  follow/monitor  Onychomycosis of multiple toenails with type 2 diabetes mellitus (HCC)    Bilateral big toe fungal infection will treat with  Butenafine HCl 1 % cream monitor  Other orders -     Butenafine HCl 1 % cream; Place on affected areas (toes)   twice a day    Meds ordered this encounter  Medications  . Butenafine HCl 1 % cream    Sig: Place on affected areas (toes) twice a day    Dispense:  30 g    Refill:  1    Follow-up: Return in about 6 months (around 05/27/2019) for physical evaluation in person.    Kerin Perna, NP

## 2018-11-27 LAB — CMP14+EGFR
ALT: 14 IU/L (ref 0–44)
AST: 11 IU/L (ref 0–40)
Albumin/Globulin Ratio: 1.4 (ref 1.2–2.2)
Albumin: 4 g/dL (ref 4.0–5.0)
Alkaline Phosphatase: 101 IU/L (ref 39–117)
BUN/Creatinine Ratio: 16 (ref 9–20)
BUN: 10 mg/dL (ref 6–24)
Bilirubin Total: 0.2 mg/dL (ref 0.0–1.2)
CO2: 22 mmol/L (ref 20–29)
Calcium: 9.5 mg/dL (ref 8.7–10.2)
Chloride: 103 mmol/L (ref 96–106)
Creatinine, Ser: 0.64 mg/dL — ABNORMAL LOW (ref 0.76–1.27)
GFR calc Af Amer: 132 mL/min/{1.73_m2} (ref >59)
GFR calc non Af Amer: 114 mL/min/{1.73_m2} (ref >59)
Globulin, Total: 2.8 g/dL (ref 1.5–4.5)
Glucose: 166 mg/dL — ABNORMAL HIGH (ref 65–99)
Potassium: 3.6 mmol/L (ref 3.5–5.2)
Sodium: 140 mmol/L (ref 134–144)
Total Protein: 6.8 g/dL (ref 6.0–8.5)

## 2018-11-27 LAB — CBC WITH DIFFERENTIAL/PLATELET
Basophils Absolute: 0.1 10*3/uL (ref 0.0–0.2)
Basos: 1 %
EOS (ABSOLUTE): 0.4 10*3/uL (ref 0.0–0.4)
Eos: 5 %
Hematocrit: 37.9 % (ref 37.5–51.0)
Hemoglobin: 12.5 g/dL — ABNORMAL LOW (ref 13.0–17.7)
Immature Grans (Abs): 0 10*3/uL (ref 0.0–0.1)
Immature Granulocytes: 0 %
Lymphocytes Absolute: 2.2 10*3/uL (ref 0.7–3.1)
Lymphs: 25 %
MCH: 28 pg (ref 26.6–33.0)
MCHC: 33 g/dL (ref 31.5–35.7)
MCV: 85 fL (ref 79–97)
Monocytes Absolute: 0.5 10*3/uL (ref 0.1–0.9)
Monocytes: 6 %
Neutrophils Absolute: 5.4 10*3/uL (ref 1.4–7.0)
Neutrophils: 63 %
Platelets: 260 10*3/uL (ref 150–450)
RBC: 4.46 x10E6/uL (ref 4.14–5.80)
RDW: 14.3 % (ref 11.6–15.4)
WBC: 8.5 10*3/uL (ref 3.4–10.8)

## 2018-11-27 LAB — LIPID PANEL
Chol/HDL Ratio: 3.7 ratio (ref 0.0–5.0)
Cholesterol, Total: 159 mg/dL (ref 100–199)
HDL: 43 mg/dL (ref >39)
LDL Chol Calc (NIH): 88 mg/dL (ref 0–99)
Triglycerides: 164 mg/dL — ABNORMAL HIGH (ref 0–149)
VLDL Cholesterol Cal: 28 mg/dL (ref 5–40)

## 2018-12-01 ENCOUNTER — Encounter: Payer: Self-pay | Admitting: Occupational Therapy

## 2018-12-01 ENCOUNTER — Encounter: Payer: Self-pay | Admitting: Physical Therapy

## 2018-12-01 ENCOUNTER — Other Ambulatory Visit: Payer: Self-pay

## 2018-12-01 ENCOUNTER — Ambulatory Visit: Payer: Medicaid Other | Admitting: Physical Therapy

## 2018-12-01 ENCOUNTER — Ambulatory Visit: Payer: Medicaid Other | Admitting: Occupational Therapy

## 2018-12-01 DIAGNOSIS — M25612 Stiffness of left shoulder, not elsewhere classified: Secondary | ICD-10-CM

## 2018-12-01 DIAGNOSIS — R29818 Other symptoms and signs involving the nervous system: Secondary | ICD-10-CM

## 2018-12-01 DIAGNOSIS — M6281 Muscle weakness (generalized): Secondary | ICD-10-CM | POA: Diagnosis not present

## 2018-12-01 DIAGNOSIS — M25611 Stiffness of right shoulder, not elsewhere classified: Secondary | ICD-10-CM

## 2018-12-01 DIAGNOSIS — R208 Other disturbances of skin sensation: Secondary | ICD-10-CM

## 2018-12-01 DIAGNOSIS — R278 Other lack of coordination: Secondary | ICD-10-CM

## 2018-12-01 DIAGNOSIS — M25632 Stiffness of left wrist, not elsewhere classified: Secondary | ICD-10-CM

## 2018-12-01 DIAGNOSIS — M542 Cervicalgia: Secondary | ICD-10-CM

## 2018-12-01 DIAGNOSIS — M25631 Stiffness of right wrist, not elsewhere classified: Secondary | ICD-10-CM

## 2018-12-01 DIAGNOSIS — R293 Abnormal posture: Secondary | ICD-10-CM

## 2018-12-01 NOTE — Therapy (Signed)
[facility] [address] [place], [place], [address] Phone: [phone]   Fax:  [phone]  Physical Therapy Treatment  Patient Details  Name: [name] MRN: [id number] Date of Birth: Jan 22, 1966 Referring Provider ([name]): Lanny CrampPatrick O'Brien, MD - referring physician.  Patient to have [facility].  New PCP: Grayce SessionsMichelle P Edwards, NP   Encounter Date: 12/01/2018  [name] End of Session - 12/01/18 1732    Visit Number  7    Number of Visits  16   eval + initial 3 visits from Medicaid   Date for [name] Re-Evaluation  02/09/19    Authorization Type  Medicaid    Authorization Time Period  11/18/18-02/09/19; 12 visits    Authorization - Visit Number  3    Authorization - Number of Visits  12    [name] Start Time  1405    [name] Stop Time  1450    [name] Time Calculation (min)  45 min    Activity Tolerance  Patient tolerated treatment well    Behavior During Therapy  [facility] for tasks assessed/performed       Past Medical History:  Diagnosis Date  . Diabetes mellitus without complication (HCC)     History reviewed. No pertinent surgical history.  There were no vitals filed for this visit.  Subjective Assessment - 12/01/18 1724    Subjective  Doing well, very minor pain in R shoulder    Patient is accompained by:  Family member    Patient Stated Goals  wants to do exercises as soon as he can, wants to be able to feel his legs again if he can.    Currently in Pain?  Yes                       [facility] Adult [name] Treatment/Exercise - 12/01/18 1725      Transfers   Transfers  Lateral/Scoot Transfers    Lateral/Scoot Transfers  With slide board;2: Max assist    Lateral Transfers: Patient Percentage  40%    Lateral/Scoot Transfer Details (indicate cue type and reason)  Level transfer: Placed towel between buttocks and slideboard to decrease friction and allow [name] to begin to initiate sliding across board.  Second  person behind for safety only.  Therapist in front assisted with maintaining forward trunk flexion and weight shift off of hips and verbal cues for hand placement.  Cues to initiate sliding with lateral pelvic motion and UE extension.  [name] able to assist ~40% with sliding.      Comments  Following transfer to mat had [name] practice lateral lean to L while RUE removed board from under hips      Dynamic Sitting Balance   Dynamic Sitting - Balance Support  Right upper extremity supported;Left upper extremity supported;Feet supported    Dynamic Sitting - Level of Assistance  5: Stand by assistance;4: Min assist    Dynamic Sitting - Balance Activities  Lateral lean/weight shifting;Forward lean/weight shifting;Ball toss;Reaching across midline    Sitting balance - Comments  focused on maintaining stable balance seated while placing both hands on ball and lifting/tossing to therapist and using inhalation and exhalation to provide greater stability when raising bilat UE in sitting without back support.  Performed ball tosses in front of patient and to the side to encourage trunk rotation.  Also performed reaching across midline and placing one UE on ball and with trunk rotation pushing ball away from patient x 8  reps on R and L.  Also placed bilat hands on ball in front of patient and performed trunk flexion <> upright sitting pushing ball away and reaching forwards out of BOS with only close supervision-min A             [name] Education - 12/01/18 1732    Education Details  slide board transfer, using breathing for stability in sitting    Person(s) Educated  Patient    Methods  Explanation;Demonstration    Comprehension  Need further instruction       [name] Short Term Goals - 11/05/18 2215      [name] SHORT TERM GOAL #1   Title  Patient will direct initial HEP independently to wife/caregivers for stretching LEs in order to prevent risk of contractures.    Baseline  have initiated cervical and shoulder ROM  HEP; have not initiated stretching for LE    Time  6    Period  Weeks   visits   Status  Revised    Target Date  12/20/18      [name] SHORT TERM GOAL #2   Title  Patient will perform sitting balance with A/P weight shifting with mod A in order to prepare for sliding board lateral transfers from mat <> w/c.    Baseline  total A in long sitting; short sitting can require as little as mod A but + 2 for safety    Time  6    Period  Weeks    Status  Revised    Target Date  12/20/18      [name] SHORT TERM GOAL #3   Title  Patient will perform rolling right and left in bed with mod A in order to decrease caregiver burden and for pressure relief.    Baseline  total A    Time  6    Period  Weeks    Status  Revised    Target Date  12/20/18      [name] SHORT TERM GOAL #4   Title  Patient will perform scoot pivot t/f on sliding board from w/c <> mat table (from one surface elevated with gravity assisting) with max A.    Baseline  +2 assistance - just cleared to WB through R wrist to assist with transfers    Time  6    Period  Weeks    Status  Revised    Target Date  12/20/18        [name] Long Term Goals - 12/01/18 1736      [name] LONG TERM GOAL #1   Title  Patient will direct final HEP independently to wife/caregivers for stretching LEs in order to prevent risk of contractures. ALL LTGS DUE BY 12TH VISIT OR 02/09/2019    Baseline  dependent    Time  12    Period  --   visits   Status  Revised      [name] LONG TERM GOAL #2   Title  When [name] receives his own w/c (vs. loaner chair) [name] will demonstrate independence with w/c parts in order to safely prepare for transfers.    Baseline  not yet assessed    Time  12    Period  Weeks    Status  Revised      [name] LONG TERM GOAL #3   Title  Patient will perform sitting balance with A/P and M/L weight shifting with supervision/min A in order to perform sliding board lateral transfers from mat <>  w/c.    Baseline  currently max A for dynamic sitting balance     Time  12    Period  Weeks    Status  Revised      [name] LONG TERM GOAL #4   Title  Patient will perform rolling right and left in bed with min A in order to decrease caregiver burden and for pressure relief.    Baseline  Total A    Time  12    Period  Weeks    Status  Revised      [name] LONG TERM GOAL #5   Title  Patient will perform scoot pivot t/f on sliding board from w/c <> mat table on level surface with min-mod A.    Baseline  total A x2 with gravity assisting transfer    Time  12    Period  Weeks    Status  Revised            Plan - 12/01/18 1733    Clinical Impression Statement  [name] demonstrated significant improvements in static and dynamic sitting balance today only requiring intermittent min A when bilat UE lifted off support surface.  [name] is now able to sit without back support but with foot and UE support but without therapist maintaining contact.  [name] also demonstrating significant improvement in UE pain, ROM, strength and trunk mobility allowing [name] to assist with slideboard transfer and engage in dynamic sitting balance activities.  Will continue to address and progress towards LTG.    [name] Treatment/Interventions  ADLs/Self Care Home Management;DME Instruction;Functional mobility training;Therapeutic activities;Therapeutic exercise;Balance training;Neuromuscular re-education;Wheelchair mobility training;Patient/family education;Passive range of motion;Energy conservation;Electrical Stimulation;Orthotic Fit/Training;Manual techniques    [name] Next Visit Plan  Slideboard transfers with patient doing more.  Check HEP: LLE stretches, rolling with UE momentum; pushing up to sitting - long sitting, circle sitting. Sitting balance A/P and M/L, range of motion hips and shoulders, UE strengthening.    Consulted and Agree with Plan of Care  Patient;Family member/caregiver       Patient will benefit from skilled therapeutic intervention in order to improve the following deficits and  impairments:  Decreased activity tolerance, Decreased balance, Decreased mobility, Decreased endurance, Decreased range of motion, Decreased strength, Impaired flexibility, Impaired sensation, Impaired UE functional use, Pain, Impaired tone, Postural dysfunction  Visit Diagnosis: Other symptoms and signs involving the nervous system  Other disturbances of skin sensation  Muscle weakness (generalized)  Abnormal posture  Cervicalgia     Problem List There are no active problems to display for this patient.   [name], [name], [name] 12/01/18    5:38 PM    [facility] [address] [place], [place], [address] Phone: [phone]   Fax:  [phone]  Name: [name] MRN: [id number] Date of Birth: 09-07-1965

## 2018-12-01 NOTE — Patient Instructions (Signed)
Access Code: [email or web address]  URL: [email or web address]  Date: 12/01/2018  Prepared by: Theone Murdoch   Exercises Seated Single Arm Bicep Curls Supinated with Dumbbell - 10 reps - 2 sets - 1x daily - 7x weekly Seated Wrist Extension with Dumbbell - 10 reps - 2 sets - 1x daily - 7x weekly Seated Pronation Supination with Dumbbell - 10 reps - 3 sets - 1x daily - 7x weekly

## 2018-12-01 NOTE — Therapy (Signed)
[facility] [address] [place], [place], [address] Phone: [phone]   Fax:  [phone]  Occupational Therapy Treatment  Patient Details  Name: [name] MRN: [id number] Date of Birth: 1965-03-18 Referring Provider (OT): Dr. Werner Lean   Encounter Date: 12/01/2018  OT End of Session - 12/01/18 1459    Visit Number  5    Number of Visits  13    Date for OT Re-Evaluation  01/28/19    Authorization Type  Medicaid    Authorization Time Period  12 visits  through 01/27/2019    Authorization - Visit Number  5    Authorization - Number of Visits  12    OT Start Time  5027    OT Stop Time  1528    OT Time Calculation (min)  40 min       Past Medical History:  Diagnosis Date  . Diabetes mellitus without complication ([place])     History reviewed. No pertinent surgical history.  There were no vitals filed for this visit.  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 Pt was d/c home with family on 09/24/2018.  Pt's PMH is significant for: T2DM, recent LE DVT    Limitations  cleared for AA/ROM, P/ROM and strengthening to RUE, (pt was originally NWB, will plan to perform WB to tolerance as pt is now greater than 12 weeks post injury and cleared for strengthening.    Patient Stated Goals  get better    Currently in Pain?  No/denies         Treatment: Pt transferred with sliding board mat to chair with +2 assist( primary therapist providing max A, secondary supervision to min A). Pt was able to assist with scooting. (Towel was positioned under pt to minimize drag) With w/c in reclined position pt performed shoulder flexion and chest press with ball 2 sets of 10 reps then shoulder horizontal  abduction x 10 reps) RUE wrist flexion/ extension A/ROM, then P/ROM Therapist added exercises to HEP for wrist strengthening RUE, and biceps curls 10 reps each.                    OT Short Term Goals - 11/18/18 1221      OT SHORT TERM GOAL #1   Title  I with inital HEP- 12/14/2018    Baseline  dependent    Time  6    Period  Weeks    Status  On-going    Target Date  12/14/18      OT SHORT TERM GOAL #2   Title  Pt will donn shirt with min A    Baseline  mod A    Time  6    Period  Weeks    Status  On-going      OT SHORT TERM GOAL #3   Title  Pt will demonstrate bilateral wrist flexion/ extension WFLs for ADLs/ IADLS.    Baseline  45/45 wrist flexion/ extension bilateral UE's    Time  6    Period  Weeks    Status  On-going      OT SHORT TERM GOAL #4   Title  Pt will perform LB dressing with mod A  Baseline  dependent    Time  6    Period  Weeks    Status  On-going      OT SHORT TERM GOAL #5   Title  Pt will perform UB bathing with Min A, and LB bathing with mod A    Baseline  UB mod A, LB total A in bed    Time  6    Period  Weeks    Status  On-going      OT SHORT TERM GOAL #6   Title  Pt will demonstrate ability to retireve a lightweight object at 110 shoulder flexion for RUE for increased ease with ADLs.    Baseline  95* shoulder flexion    Time  6    Period  Weeks    Status  On-going      OT SHORT TERM GOAL #7   Title  Pt will demonstrate ability to retrieve a lightweight object at 115* shoulder flexion  with LUE for increased ease with ADLs,    Baseline  105*    Time  6    Period  Weeks    Status  On-going        OT Long Term Goals - 11/18/18 1222      OT LONG TERM GOAL #1   Title  Pt will donn shirt with setup-01/28/2019    Baseline  mod A    Time  12    Period  Weeks    Status  On-going      OT LONG TERM GOAL #2   Title  Pt will perform LB dressing with min A    Baseline  dependent/ total A    Time  12    Period  Weeks     Status  On-going      OT LONG TERM GOAL #3   Title  [facility] using sliding board    Baseline  total A +2 for basic transfers    Time  12    Period  Weeks    Status  On-going      OT LONG TERM GOAL #4   Title  Pt and family will verbalize understanding regarding recommendations for shower chair, DME    Baseline  dependent, pt does not yet have shower chair, currently bathing in bed    Time  12    Period  Weeks    Status  On-going      OT LONG TERM GOAL #5   Title  Pt will increase bilateral grip strength by 10 lbs for increased ease with ADLs/ IADLS.    Baseline  RUE grip 5 lbs, LUE 3 lbs    Time  12    Period  Weeks    Status  On-going            Plan - 12/01/18 1500    Clinical Impression Statement  Pt demonstrates excellent progress with increased ease with transfers and improved UE ROM.    OT Occupational Profile and History  Detailed Assessment- Review of Records and additional review of physical, cognitive, psychosocial history related to current functional performance    Body Structure / Function / Physical Skills  ADL;UE functional use;Endurance;Balance;Flexibility;Pain;FMC;ROM;GMC;Coordination;Sensation;Decreased knowledge of precautions;Decreased knowledge of use of DME;IADL;Strength;Dexterity;Mobility;Tone    Rehab Potential  Good    Clinical Decision Making  Several treatment options, min-mod task modification necessary    Comorbidities Affecting Occupational Performance:  May have comorbidities impacting occupational performance  Modification or Assistance to Complete Evaluation   No modification of tasks or assist necessary to complete eval    OT Frequency  1x / week    OT Duration  12 weeks    OT Treatment/Interventions  Self-care/ADL training;Ultrasound;Energy conservation;Aquatic Therapy;DME and/or AE instruction;Patient/family education;Paraffin;Gait Training;Passive range of motion;Balance  training;Fluidtherapy;Cryotherapy;Splinting;Building services engineer;Contrast Bath;Electrical Stimulation;Moist Heat;Therapeutic exercise;Manual Therapy;Therapeutic activities;Cognitive remediation/compensation;Neuromuscular education    Plan  UE strengthening as able, shoulder and scapular ROM, long sit when able for ADLs, incorporate functional use of UE's at home, functional mobility    Consulted and Agree with Plan of Care  Patient    Family Member Consulted  wife,interpreter via video       Patient will benefit from skilled therapeutic intervention in order to improve the following deficits and impairments:   Body Structure / Function / Physical Skills: ADL, UE functional use, Endurance, Balance, Flexibility, Pain, FMC, ROM, GMC, Coordination, Sensation, Decreased knowledge of precautions, Decreased knowledge of use of DME, IADL, Strength, Dexterity, Mobility, Tone       Visit Diagnosis: Muscle weakness (generalized)  Abnormal posture  Other symptoms and signs involving the nervous system  Stiffness of right shoulder, not elsewhere classified  Stiffness of left shoulder, not elsewhere classified  Other disturbances of skin sensation  Stiffness of right wrist, not elsewhere classified  Stiffness of left wrist, not elsewhere classified  Other lack of coordination    Problem List There are no active problems to display for this patient.   RINE,KATHRYN 12/01/2018, 4:17 PM  [facility] [address] [place], [place], [address] Phone: [phone]   Fax:  [phone]  Name: [name] MRN: [id number] Date of Birth: 02-12-1966

## 2018-12-02 ENCOUNTER — Telehealth (INDEPENDENT_AMBULATORY_CARE_PROVIDER_SITE_OTHER): Payer: Self-pay

## 2018-12-02 NOTE — Telephone Encounter (Signed)
Call placed using pacific interpreter [phone]) patient is aware Only elevated TG needs modify diet before prescribing medication decrease To lower your number you can decrease your fatty foods, red meat, cheese, milk and increase fiber like whole grains and veggies. You can also add a fiber supplement like Metamucil or Benefiber. Patient expressed understanding.  Nat Christen, CMA

## 2018-12-02 NOTE — Telephone Encounter (Signed)
-----   Message from Kerin Perna, NP sent at 11/30/2018  9:04 AM EDT ----- Notes recorded by Kerin Perna, NP on 11/30/2018 at 9:04 AM EDT  Only elevated TG needs modify diet before prescribing medication decrease To lower your number you can decrease your fatty foods, red meat, cheese, milk and increase fiber like whole grains and veggies. You can also add a fiber supplement like Metamucil or Benefiber.

## 2018-12-08 ENCOUNTER — Ambulatory Visit: Payer: Medicaid Other | Admitting: Family

## 2018-12-09 ENCOUNTER — Ambulatory Visit: Payer: Medicaid Other

## 2018-12-09 ENCOUNTER — Ambulatory Visit: Payer: Medicaid Other | Admitting: Occupational Therapy

## 2018-12-09 ENCOUNTER — Other Ambulatory Visit: Payer: Self-pay

## 2018-12-09 ENCOUNTER — Encounter: Payer: Self-pay | Admitting: Occupational Therapy

## 2018-12-09 DIAGNOSIS — R29818 Other symptoms and signs involving the nervous system: Secondary | ICD-10-CM

## 2018-12-09 DIAGNOSIS — R293 Abnormal posture: Secondary | ICD-10-CM

## 2018-12-09 DIAGNOSIS — M25632 Stiffness of left wrist, not elsewhere classified: Secondary | ICD-10-CM

## 2018-12-09 DIAGNOSIS — M25631 Stiffness of right wrist, not elsewhere classified: Secondary | ICD-10-CM

## 2018-12-09 DIAGNOSIS — R208 Other disturbances of skin sensation: Secondary | ICD-10-CM

## 2018-12-09 DIAGNOSIS — M6281 Muscle weakness (generalized): Secondary | ICD-10-CM | POA: Diagnosis not present

## 2018-12-09 DIAGNOSIS — M25612 Stiffness of left shoulder, not elsewhere classified: Secondary | ICD-10-CM

## 2018-12-09 DIAGNOSIS — M25611 Stiffness of right shoulder, not elsewhere classified: Secondary | ICD-10-CM

## 2018-12-09 DIAGNOSIS — G8222 Paraplegia, incomplete: Secondary | ICD-10-CM

## 2018-12-09 NOTE — Therapy (Signed)
[facility] [address] [place], [place], [address] Phone: [phone]   Fax:  [phone]  Occupational Therapy Treatment  Patient Details  Name: [name] MRN: [id number] Date of Birth: [birth year] Referring Provider (OT): Dr. Alois Cliche   Encounter Date: 12/09/2018  OT End of Session - 12/09/18 1217    Visit Number  6    Number of Visits  13    Date for OT Re-Evaluation  01/28/19    Authorization Type  Medicaid    Authorization Time Period  12 visits  through 01/27/2019    Authorization - Visit Number  6    Authorization - Number of Visits  12    OT Start Time  0931    OT Stop Time  1015    OT Time Calculation (min)  44 min    Activity Tolerance  Patient tolerated treatment well       Past Medical History:  Diagnosis Date  . Diabetes mellitus without complication (HCC)     History reviewed. No pertinent surgical history.  There were no vitals filed for this visit.  Subjective Assessment - 12/09/18 1208    Subjective   I can wash my legs like this!    Patient is accompanied by:  Family member;Interpreter   wife   Pertinent History  1 burst fx with [place] transection s/p C4-T6 spinal fusion (6/10), incomplete para deficits and s/p distal radius fx. Pt was d/c home with family on 09/24/2018.  Pt's PMH is significant for: T2DM, recent LE DVT    Limitations  cleared for AA/ROM, P/ROM and strengthening to RUE, (pt was originally NWB, will plan to perform WB to tolerance as pt is now greater than 12 weeks post injury and cleared for strengthening.    Patient Stated Goals  get better    Currently in Pain?  No/denies                   OT Treatments/Exercises (OP) - 12/09/18 0001      ADLs   Bathing  Addressed simulated bathing in long sitting - pt able to reach to ankles and then practiced side sitting propped on elbows to reach part of bottom.      Functional Mobility  Sliding board transfers to  the left from wheelchair to mat - pt able to set up wheelchair with min cues only - mod a to place board.  Mod a x1, stand by assist x1 and cues for transfer.  Addressed sitting balance at EOM with no UE support and small weight shifts with reaching in closed chain side to side and forward.  Pt with only minimal LOB.  Practiced transitioning from EOM to lying supine - min a x1 for UB and asssit with LE's.  Transitioned into long sitting with max a x1.  Pt able to maintiain balance in long sitting with only 1 LOB.  Transitioned from long sitting to propped on L elbow to supine - pt needs max as at this time but will improve with practice.      ADL Comments  Pt reports he is doing his own oral hygiene mod I as well as washing is face, chest, arms and belly.  Pt reports he can take his shirt off in bed but can't put shirt on.  Had pt don and doff shirt in sitting in wheelchair -pt able to doff and only needed very minimal assist to pull shirt down all the way in back (pt  needed cues).  Pt and wife encouraged to carry this over at home with pt wearing seat belt and wife standing close by to provide supervision for balance. Pt and wife in agreement.                 OT Short Term Goals - 11/18/18 1221      OT SHORT TERM GOAL #1   Title  I with inital HEP- 12/14/2018    Baseline  dependent    Time  6    Period  Weeks    Status  On-going    Target Date  12/14/18      OT SHORT TERM GOAL #2   Title  Pt will donn shirt with min A    Baseline  mod A    Time  6    Period  Weeks    Status  On-going      OT SHORT TERM GOAL #3   Title  Pt will demonstrate bilateral wrist flexion/ extension WFLs for ADLs/ IADLS.    Baseline  45/45 wrist flexion/ extension bilateral UE's    Time  6    Period  Weeks    Status  On-going      OT SHORT TERM GOAL #4   Title  Pt will perform LB dressing with mod A    Baseline  dependent    Time  6    Period  Weeks    Status  On-going      OT SHORT TERM GOAL #5    Title  Pt will perform UB bathing with Min A, and LB bathing with mod A    Baseline  UB mod A, LB total A in bed    Time  6    Period  Weeks    Status  On-going      OT SHORT TERM GOAL #6   Title  Pt will demonstrate ability to retireve a lightweight object at 110 shoulder flexion for RUE for increased ease with ADLs.    Baseline  95* shoulder flexion    Time  6    Period  Weeks    Status  On-going      OT SHORT TERM GOAL #7   Title  Pt will demonstrate ability to retrieve a lightweight object at 115* shoulder flexion  with LUE for increased ease with ADLs,    Baseline  105*    Time  6    Period  Weeks    Status  On-going        OT Long Term Goals - 11/18/18 1222      OT LONG TERM GOAL #1   Title  Pt will donn shirt with setup-01/28/2019    Baseline  mod A    Time  12    Period  Weeks    Status  On-going      OT LONG TERM GOAL #2   Title  Pt will perform LB dressing with min A    Baseline  dependent/ total A    Time  12    Period  Weeks    Status  On-going      OT LONG TERM GOAL #3   Title  [facility] with mod A using sliding board    Baseline  total A +2 for basic transfers    Time  12    Period  Weeks    Status  On-going      OT LONG TERM  GOAL #4   Title  Pt and family will verbalize understanding regarding recommendations for shower chair, DME    Baseline  dependent, pt does not yet have shower chair, currently bathing in bed    Time  12    Period  Weeks    Status  On-going      OT LONG TERM GOAL #5   Title  Pt will increase bilateral grip strength by 10 lbs for increased ease with ADLs/ IADLS.    Baseline  RUE grip 5 lbs, LUE 3 lbs    Time  12    Period  Weeks    Status  On-going            Plan - 12/09/18 1216    Clinical Impression Statement  Pt with good progress in functional mobility and balance.    OT Occupational Profile and History  Detailed Assessment- Review of Records and additional review of physical, cognitive,  psychosocial history related to current functional performance    Occupational performance deficits (Please refer to evaluation for details):  ADL's;IADL's;Work;Leisure;Social Participation    Body Structure / Function / Physical Skills  ADL;UE functional use;Endurance;Balance;Flexibility;Pain;FMC;ROM;GMC;Coordination;Sensation;Decreased knowledge of precautions;Decreased knowledge of use of DME;IADL;Strength;Dexterity;Mobility;Tone    Rehab Potential  Good    Clinical Decision Making  Several treatment options, min-mod task modification necessary    Comorbidities Affecting Occupational Performance:  May have comorbidities impacting occupational performance    Modification or Assistance to Complete Evaluation   No modification of tasks or assist necessary to complete eval    OT Frequency  1x / week    OT Duration  12 weeks    OT Treatment/Interventions  Self-care/ADL training;Ultrasound;Energy conservation;Aquatic Therapy;DME and/or AE instruction;Patient/family education;Paraffin;Gait Training;Passive range of motion;Balance training;Fluidtherapy;Cryotherapy;Splinting;Therapist, nutritional;Contrast Bath;Electrical Stimulation;Moist Heat;Therapeutic exercise;Manual Therapy;Therapeutic activities;Cognitive remediation/compensation;Neuromuscular education    Plan  UE strengthening as able, shoulder and scapular ROM, long sit  for ADLs, incorporate functional use of UE's at home, functional mobility    Consulted and Agree with Plan of Care  Patient;Family member/caregiver    Family Member Consulted  wife,interpreter via video       Patient will benefit from skilled therapeutic intervention in order to improve the following deficits and impairments:   Body Structure / Function / Physical Skills: ADL, UE functional use, Endurance, Balance, Flexibility, Pain, FMC, ROM, GMC, Coordination, Sensation, Decreased knowledge of precautions, Decreased knowledge of use of DME, IADL, Strength, Dexterity,  Mobility, Tone       Visit Diagnosis: Muscle weakness (generalized)  Abnormal posture  Other symptoms and signs involving the nervous system  Stiffness of right shoulder, not elsewhere classified  Stiffness of left shoulder, not elsewhere classified  Other disturbances of skin sensation  Stiffness of right wrist, not elsewhere classified  Stiffness of left wrist, not elsewhere classified    Problem List There are no active problems to display for this patient.   Quay Burow, OTR/L 12/09/2018, 12:19 PM  [facility] [address] [place], [place], [address] Phone: [phone]   Fax:  [phone]  Name: [name] MRN: [id number] Date of Birth: [birth year]

## 2018-12-09 NOTE — Therapy (Signed)
[facility] [address] [place], [place], [address] Phone: [phone]   Fax:  [phone]  Physical Therapy Treatment  Patient Details  Name: [name] MRN: [id number] Date of Birth: [birth year] Referring Provider (PT): Lanny CrampPatrick O'Brien, MD - referring physician.  [facility]  New PCP: Grayce SessionsMichelle P Edwards, NP   Encounter Date: 12/09/2018  PT End of Session - 12/09/18 1013    Visit Number  8    Number of Visits  16   eval + initial 3 visits from Medicaid   Date for PT Re-Evaluation  02/09/19    Authorization Type  Medicaid    Authorization Time Period  11/18/18-02/09/19; 12 visits    Authorization - Visit Number  4    Authorization - Number of Visits  12    PT Start Time  1015    PT Stop Time  1104    PT Time Calculation (min)  49 min    Equipment Utilized During Treatment  --   interpreter services utilized as well as rehab tech   Activity Tolerance  Patient tolerated treatment well    Behavior During Therapy  [facility] for tasks assessed/performed       Past Medical History:  Diagnosis Date  . Diabetes mellitus without complication (HCC)     History reviewed. No pertinent surgical history.  There were no vitals filed for this visit.  Subjective Assessment - 12/09/18 1014    Subjective  Pt just finished with OT. Reports doing well.    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    Currently in Pain?  No/denies                       [facility]/Exercise - 12/09/18 1411      Bed Mobility   Bed Mobility  Rolling Right;Rolling Left;Supine to Sit    Rolling  Right  Maximal Assistance - Patient 25-49%    Rolling Left  Maximal Assistance - Patient 25-49%    Supine to Sit  2 Helpers;Total Assistance - Patient < 25%      Transfers   Transfers  Lateral/Scoot Transfers    Lateral/Scoot Transfers  With slide board;2: Max assist    Lateral Transfers: Patient Percentage  40%    Comments  2nd person assisting for safety behind CGA/min assist to slide and then max assist to position back in wheelchair more. Pt was given verbal cues to push with arms during transfer. Pt was able to assist to remove board needing only min assist to remove once in chair.      Therapeutic Activites    Therapeutic Activities  Other Therapeutic Activities    Other Therapeutic Activities  Pt performed longsitting leaning forward to help stretch hamstrings. With leaning forward, patient able to maintain position with only CGA/min assist. When sat more upright patient needed mod assist to maintain. Performed 3 bouts of 30 sec leaning forward to stretch hamstrings. Long sit with hands positioned at side bringing back in to slight shoulder extension trying to get patient to support long sit position through arms mod assist to  maintain. Pt with decreased ability to extend right shoulder with minimal pain reported. Long sit leaning down on left forearm on wedge and pushing back up x 3 with verbal cues to lean forward to help come back up easier. Coming down on left forearm to trying to help "walk around" to supine max assist +2. Rolling to each side with verbal cues to use arms for momentum to assist roll x 2 each side max assist at hips. Maintaining right sidelying with PT applying manual pertubations x 30 sec. Pt able to maintain sidelying on own. Left sidelying with trying to roll back partial and then return to sidelying x 5 with verbal cues to use shoulder to help mod assist. Rolling prone max assist with getting on forearms and maintaining position about 2 min with weight shifting side to  side x 10, scapular retraction and protraction x 10, attempting to walk forearm up/back x 2 each side max assist to help weight shift. Sidelying propping up on elbow max assist +2  to get in position but then patient able to maintain on own. Performed on each side x 2. Then from left side up on elbow attempted to try to come up to long sit "walking arms" around. Pt was max assist +1 and min assist second person to rise.               PT Short Term Goals - 11/05/18 2215      PT SHORT TERM GOAL #1   Title  Patient will direct initial HEP independently to wife/caregivers for stretching LEs in order to prevent risk of contractures.    Baseline  have initiated cervical and shoulder ROM HEP; have not initiated stretching for LE    Time  6    Period  Weeks   visits   Status  Revised    Target Date  12/20/18      PT SHORT TERM GOAL #2   Title  Patient will perform sitting balance with A/P weight shifting with mod A in order to prepare for sliding board lateral transfers from mat <> w/c.    Baseline  total A in long sitting; short sitting can require as little as mod A but + 2 for safety    Time  6    Period  Weeks    Status  Revised    Target Date  12/20/18      PT SHORT TERM GOAL #3   Title  Patient will perform rolling right and left in bed with mod A in order to decrease caregiver burden and for pressure relief.    Baseline  total A    Time  6    Period  Weeks    Status  Revised    Target Date  12/20/18      PT SHORT TERM GOAL #4   Title  Patient will perform scoot pivot t/f on sliding board from w/c <> mat table (from one surface elevated with gravity assisting) with max A.    Baseline  +2 assistance - just cleared to WB through R wrist to assist with transfers    Time  6    Period  Weeks    Status  Revised    Target Date  12/20/18        PT Long Term Goals - 12/01/18 1736      PT LONG TERM GOAL #1   Title  Patient will direct final HEP independently to  wife/caregivers for stretching LEs  in order to prevent risk of contractures. ALL LTGS DUE BY 12TH VISIT OR 02/09/2019    Baseline  dependent    Time  12    Period  --   visits   Status  Revised      PT LONG TERM GOAL #2   Title  When pt receives his own w/c (vs. loaner chair) pt will demonstrate independence with w/c parts in order to safely prepare for transfers.    Baseline  not yet assessed    Time  12    Period  Weeks    Status  Revised      PT LONG TERM GOAL #3   Title  Patient will perform sitting balance with A/P and M/L weight shifting with supervision/min A in order to perform sliding board lateral transfers from mat <> w/c.    Baseline  currently max A for dynamic sitting balance    Time  12    Period  Weeks    Status  Revised      PT LONG TERM GOAL #4   Title  Patient will perform rolling right and left in bed with min A in order to decrease caregiver burden and for pressure relief.    Baseline  Total A    Time  12    Period  Weeks    Status  Revised      PT LONG TERM GOAL #5   Title  Patient will perform scoot pivot t/f on sliding board from w/c <> mat table on level surface with min-mod A.    Baseline  total A x2 with gravity assisting transfer    Time  12    Period  Weeks    Status  Revised            Plan - 12/09/18 1426    Clinical Impression Statement  Pt was able to participate more with bed mobility today. Improved long sit control with less assistance needed to maintain. Pt able to begin process of walking arms around to come to long sit but still max assist. Was able to maintain sidelying up on elbow today. Pt reporting decreased pain in right shoulder with activities. Pt continues to benefit from skilled PT to further progress functional mobility.    PT Treatment/Interventions  ADLs/Self Care Home Management;DME Instruction;Functional mobility training;Therapeutic activities;Therapeutic exercise;Balance training;Neuromuscular re-education;Wheelchair  mobility training;Patient/family education;Passive range of motion;Energy conservation;Electrical Stimulation;Orthotic Fit/Training;Manual techniques    PT Next Visit Plan  Slideboard transfers with patient doing more.  Check HEP: LLE stretches, rolling with UE momentum; pushing up to sitting - long sitting, circle sitting. Sitting balance A/P and M/L, range of motion hips and shoulders, UE strengthening.    Consulted and Agree with Plan of Care  Patient;Family member/caregiver       Patient will benefit from skilled therapeutic intervention in order to improve the following deficits and impairments:  Decreased activity tolerance, Decreased balance, Decreased mobility, Decreased endurance, Decreased range of motion, Decreased strength, Impaired flexibility, Impaired sensation, Impaired UE functional use, Pain, Impaired tone, Postural dysfunction  Visit Diagnosis: Muscle weakness (generalized)  Paraplegia, incomplete ([place])     Problem List There are no active problems to display for this patient.   Electa Sniff, PT, DPT, NCS 12/09/2018, 2:32 PM  [facility] [address] [place], [place], [address] Phone: [phone]   Fax:  [phone]  Name: Nikesh Teschner MRN: [id number] Date of Birth: Sep 25, 1965

## 2018-12-10 ENCOUNTER — Telehealth: Payer: Self-pay

## 2018-12-10 NOTE — Telephone Encounter (Signed)
Call to [facility], spoke to Heath who stated that they have not yet received the [facility] referral

## 2018-12-11 ENCOUNTER — Encounter: Payer: Self-pay | Admitting: Family

## 2018-12-11 ENCOUNTER — Ambulatory Visit (INDEPENDENT_AMBULATORY_CARE_PROVIDER_SITE_OTHER): Payer: Medicaid Other | Admitting: Family

## 2018-12-11 ENCOUNTER — Other Ambulatory Visit: Payer: Self-pay

## 2018-12-11 DIAGNOSIS — L89621 Pressure ulcer of left heel, stage 1: Secondary | ICD-10-CM | POA: Diagnosis not present

## 2018-12-11 DIAGNOSIS — L89611 Pressure ulcer of right heel, stage 1: Secondary | ICD-10-CM | POA: Diagnosis not present

## 2018-12-11 NOTE — Telephone Encounter (Signed)
Will send again today. Nat Christen, CMA

## 2018-12-11 NOTE — Telephone Encounter (Signed)
Forms still PCP initials. Will have her initial on 11/2 and fax then. Nat Christen, CMA

## 2018-12-11 NOTE — Progress Notes (Signed)
Office Visit Note   Patient: [name]           Date of Birth: 08/22/1965           MRN: [id number] Visit Date: 12/11/2018              Requested by: Grayce Sessions, NP [address] [place],  [place] [address] PCP: Grayce Sessions, NP  Chief Complaint  Patient presents with  . Right Foot - Follow-up  . Left Foot - Follow-up      HPI: The patient is a 53 year old gentleman who is seen today in referral from primary care for concern of pressure ulcerations to bilateral heels.  The patient is currently in a motorized chair he is newly immobilized in his chair.  He states that he wore some new shoes a few weeks ago and these caused him increased pressure in his heels this was new this had not occurred before he had fluid-filled blisters to his heels which did pop.  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  Assessment & Plan: Visit Diagnoses: No diagnosis found.  Plan: Discussed offloading the heels will provide him with PRAFO boots today he will continue with his current physical therapy.  Discussed using the PRAFO's or offloading the heels floating them whenever at rest.  Follow-up as needed  Follow-Up Instructions: No follow-ups on file.   Ortho Exam  Patient is alert, oriented, no adenopathy, well-dressed, normal affect, normal respiratory effort. On examination bilateral lower extremities he does have edema this is nonpitting.  There is no erythema no ulceration he does have some dry flaking skin with mild discoloration to bilateral heels there is no erythema no warmth no drainage these are well-healed.  Imaging: No results found. No images are attached to the encounter.  Labs: No results found for: HGBA1C, ESRSEDRATE, CRP, LABURIC, REPTSTATUS, GRAMSTAIN, CULT, LABORGA   Lab Results  Component Value Date   ALBUMIN 4.0 11/26/2018    No results found for: MG No results found for: VD25OH  No  results found for: PREALBUMIN CBC EXTENDED Latest Ref Rng & Units 11/26/2018 04/24/2015 01/08/2009  WBC 3.4 - 10.8 x10E3/uL 8.5 5.2 7.1  RBC 4.14 - 5.80 x10E6/uL 4.46 5.22 5.10  HGB 13.0 - 17.7 g/dL 12.5(L) 15.7 15.8  HCT 37.5 - 51.0 % 37.9 46.6 45.1  PLT 150 - 450 x10E3/uL 260 155 202  NEUTROABS 1.4 - 7.0 x10E3/uL 5.4 - 4.9  LYMPHSABS 0.7 - 3.1 x10E3/uL 2.2 - 1.3     There is no height or weight on file to calculate BMI.  Orders:  No orders of the defined types were placed in this encounter.  No orders of the defined types were placed in this encounter.    Procedures: No procedures performed  Clinical Data: No additional findings.  ROS:  All other systems negative, except as noted in the HPI. Review of Systems  Constitutional: Negative for chills and fever.  Cardiovascular: Positive for leg swelling.  Skin: Positive for color change. Negative for wound.    Objective: Vital Signs: There were no vitals taken for this visit.  Specialty Comments:  No specialty comments available.  PMFS History: There are no active problems to display for this patient.  Past Medical History:  Diagnosis Date  . Diabetes mellitus without complication (HCC)     No family history on file.  No past surgical history on file. Social History   Occupational  History  . Not on file  Tobacco Use  . Smoking status: Never Smoker  . Smokeless tobacco: Never Used  Substance and Sexual Activity  . Alcohol use: Yes  . Drug use: No  . Sexual activity: Not on file

## 2018-12-16 ENCOUNTER — Ambulatory Visit: Payer: Medicaid Other | Attending: Physical Medicine and Rehabilitation | Admitting: Physical Therapy

## 2018-12-16 ENCOUNTER — Ambulatory Visit: Payer: Medicaid Other | Admitting: Occupational Therapy

## 2018-12-16 ENCOUNTER — Encounter: Payer: Self-pay | Admitting: Occupational Therapy

## 2018-12-16 ENCOUNTER — Encounter: Payer: Self-pay | Admitting: Physical Therapy

## 2018-12-16 ENCOUNTER — Other Ambulatory Visit: Payer: Self-pay

## 2018-12-16 DIAGNOSIS — M6281 Muscle weakness (generalized): Secondary | ICD-10-CM

## 2018-12-16 DIAGNOSIS — M25612 Stiffness of left shoulder, not elsewhere classified: Secondary | ICD-10-CM

## 2018-12-16 DIAGNOSIS — G8252 Quadriplegia, C1-C4 incomplete: Secondary | ICD-10-CM

## 2018-12-16 DIAGNOSIS — R29818 Other symptoms and signs involving the nervous system: Secondary | ICD-10-CM | POA: Diagnosis not present

## 2018-12-16 DIAGNOSIS — M542 Cervicalgia: Secondary | ICD-10-CM | POA: Diagnosis present

## 2018-12-16 DIAGNOSIS — R293 Abnormal posture: Secondary | ICD-10-CM

## 2018-12-16 DIAGNOSIS — R209 Unspecified disturbances of skin sensation: Secondary | ICD-10-CM | POA: Insufficient documentation

## 2018-12-16 DIAGNOSIS — M25632 Stiffness of left wrist, not elsewhere classified: Secondary | ICD-10-CM

## 2018-12-16 DIAGNOSIS — M25611 Stiffness of right shoulder, not elsewhere classified: Secondary | ICD-10-CM | POA: Insufficient documentation

## 2018-12-16 DIAGNOSIS — R208 Other disturbances of skin sensation: Secondary | ICD-10-CM

## 2018-12-16 DIAGNOSIS — M25631 Stiffness of right wrist, not elsewhere classified: Secondary | ICD-10-CM | POA: Insufficient documentation

## 2018-12-16 NOTE — Therapy (Signed)
[facility] [address] [place], [place], [address] Phone: [phone]   Fax:  [phone]  Occupational Therapy Treatment  Patient Details  Name: [name] MRN: [id number] Date of Birth: 1965-07-19 Referring Provider (OT): Dr. Alois Cliche   Encounter Date: 12/16/2018  OT End of Session - 12/16/18 1747    Visit Number  7    Number of Visits  13    Date for OT Re-Evaluation  01/28/19    Authorization Type  Medicaid    Authorization Time Period  12 visits  through 01/27/2019    Authorization - Visit Number  7    Authorization - Number of Visits  12    OT Start Time  1102    OT Stop Time  1150    OT Time Calculation (min)  48 min    Activity Tolerance  Patient tolerated treatment well       Past Medical History:  Diagnosis Date  . Diabetes mellitus without complication (HCC)     History reviewed. No pertinent surgical history.  There were no vitals filed for this visit.  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 Pt was d/c home with family on 09/24/2018.  Pt's PMH is significant for: T2DM, recent LE DVT    Limitations  cleared for AA/ROM, P/ROM and strengthening to RUE, (pt was originally NWB, will plan to perform WB to tolerance as pt is now greater than 12 weeks post injury and cleared for strengthening.    Patient Stated Goals  get better    Currently in Pain?  Yes    Pain Score  5     Pain Location  Shoulder    Pain Orientation  Right    Pain Descriptors / Indicators  Sore;Tightness;Shooting    Pain Type  Chronic pain    Pain Onset  More than a month ago    Pain Frequency  Intermittent    Aggravating Factors   ER of R shoulder    Pain Relieving Factors  avoid  this movement                   OT Treatments/Exercises (OP) - 12/16/18 1729      ADLs   Functional Mobility  Practiced transitioning from supine on wedge to long sitting using strap as leg loop - pt able to complete with min a (from elevated surface of wedge) x3 using RUE.  Addressed reaching to toes in long sitting and sitting back up - pt able to complete 10 reps with close supervision only.  Assisted pt to sitting EOM Pt able to control upper body with close superivsion to intermittent contact guard while therapist assisted with LE's.  Pt able to lean side to side with intermittent min a in order to assist with scooting forward.  Pt able to sit EOM with close supervision. Addressed sliding board transfers from mat to chair (moving to pt's R) - due to fatigue pt required max a x1 and mod cues.     ADL Comments  Pt today in sitting (in wheelchair with trunk support) able to achieve 115* of shoulder flexion for RUE and 125* of shoulder flexion with LUE.  Shoulder Exercises: Supine   Other Supine Exercises  addressed overhead unilateral reach in supine with first RUE and then LUE after manual therapy to mobilize scapular and iimprove alignment of shoulder girdles.  also addressed ER of RUE - pt limited to quarter range initially however using resistance into IR followed by passive stretch into ER as well as resisstance into IR followed by AROM into ER pt gained ROM WFL's.  Pt's wrist flexion is not 60* in R wrist and 58 * in L wrist.  Pt continues to have some discomfort at end range of wrist extension for RUE that increases with resistance and weight bearing.  Issued putty (red for  R hand and yellow for L hand) to address girp strength. After instruction and practice pt able to return demonstrate.  Pt to complete 20 reps x2 per day with each hand.               OT Education - 12/16/18 1740    Education Details  putty for grip (red for R and yellow for L)    Person(s)  Educated  Patient    Methods  Explanation;Demonstration    Comprehension  Verbalized understanding;Returned demonstration       OT Short Term Goals - 12/16/18 1740      OT SHORT TERM GOAL #1   Title  I with inital HEP- 12/14/2018    Baseline  dependent    Time  6    Period  Weeks    Status  On-going    Target Date  12/14/18      OT SHORT TERM GOAL #2   Title  Pt will donn shirt with min A    Baseline  mod A    Time  6    Period  Weeks    Status  Achieved   in wheelchair     OT SHORT TERM GOAL #3   Title  Pt will demonstrate bilateral wrist flexion/ extension WFLs for ADLs/ IADLS.    Baseline  45/45 wrist flexion/ extension bilateral UE's    Time  6    Period  Weeks    Status  On-going   12/16/2018 LUE =58* and RUE = 60* however pt still with pain end ROM for R wrist     OT SHORT TERM GOAL #4   Title  Pt will perform LB dressing with mod A    Baseline  dependent    Time  6    Period  Weeks    Status  On-going      OT SHORT TERM GOAL #5   Title  Pt will perform UB bathing with Min A, and LB bathing with mod A    Baseline  UB mod A, LB total A in bed    Time  6    Period  Weeks    Status  Achieved   12/16/2018 in long sitting in clinic; pt's hospital bed is poor and does not allow pt to complete at home. Attempting to contact [employer] to determine if more suitable hospital bed can be issued. WIfe to call clinic with rep name     OT SHORT TERM GOAL #6   Title  Pt will demonstrate ability to retireve a lightweight object at 110 shoulder flexion for RUE for increased ease with ADLs.    Baseline  95* shoulder flexion    Time  6    Period  Weeks    Status  On-going  OT SHORT TERM GOAL #7   Title  Pt will demonstrate ability to retrieve a lightweight object at 115* shoulder flexion  with LUE for increased ease with ADLs,    Baseline  105*    Time  6    Period  Weeks    Status  On-going        OT Long Term Goals - 12/16/18 1745      OT LONG TERM GOAL #1    Title  Pt will donn shirt with setup-01/28/2019    Baseline  mod A    Time  12    Period  Weeks    Status  On-going      OT LONG TERM GOAL #2   Title  Pt will perform LB dressing with min A    Baseline  dependent/ total A    Time  12    Period  Weeks    Status  On-going      OT LONG TERM GOAL #3   Title  Pt will transfer to [facility] with mod A using sliding board    Baseline  total A +2 for basic transfers    Time  12    Period  Weeks    Status  On-going      OT LONG TERM GOAL #4   Title  Pt and family will verbalize understanding regarding recommendations for shower chair, DME    Baseline  dependent, pt does not yet have shower chair, currently bathing in bed    Time  12    Period  Weeks    Status  On-going      OT LONG TERM GOAL #5   Title  Pt will increase bilateral grip strength by 10 lbs for increased ease with ADLs/ IADLS.    Baseline  RUE grip 5 lbs, LUE 3 lbs    Time  12    Period  Weeks    Status  On-going            Plan - 12/16/18 1745    Clinical Impression Statement  Pt progressing with functional mobility as well as ROM for BUE"s  WIl incoporate functional reach into activties from wheelchair    OT Occupational Profile and History  Detailed Assessment- Review of Records and additional review of physical, cognitive, psychosocial history related to current functional performance    Occupational performance deficits (Please refer to evaluation for details):  ADL's;IADL's;Work;Leisure;Social Participation    Body Structure / Function / Physical Skills  ADL;UE functional use;Endurance;Balance;Flexibility;Pain;FMC;ROM;GMC;Coordination;Sensation;Decreased knowledge of precautions;Decreased knowledge of use of DME;IADL;Strength;Dexterity;Mobility;Tone    Rehab Potential  Good    Clinical Decision Making  Several treatment options, min-mod task modification necessary    Comorbidities Affecting Occupational Performance:  May have comorbidities impacting occupational  performance    Modification or Assistance to Complete Evaluation   No modification of tasks or assist necessary to complete eval    OT Frequency  1x / week    OT Duration  12 weeks    OT Treatment/Interventions  Self-care/ADL training;Ultrasound;Energy conservation;Aquatic Therapy;DME and/or AE instruction;Patient/family education;Paraffin;Gait Training;Passive range of motion;Balance training;Fluidtherapy;Cryotherapy;Splinting;Building services engineer;Contrast Bath;Electrical Stimulation;Moist Heat;Therapeutic exercise;Manual Therapy;Therapeutic activities;Cognitive remediation/compensation;Neuromuscular education    Plan  UE strengthening as able, shoulder and scapular ROM, long sit  for ADLs, incorporate functional use of UE's at home, functional mobility    Consulted and Agree with Plan of Care  Patient;Family member/caregiver    Family Member Consulted  wife,interpreter via video  Patient will benefit from skilled therapeutic intervention in order to improve the following deficits and impairments:   Body Structure / Function / Physical Skills: ADL, UE functional use, Endurance, Balance, Flexibility, Pain, FMC, ROM, GMC, Coordination, Sensation, Decreased knowledge of precautions, Decreased knowledge of use of DME, IADL, Strength, Dexterity, Mobility, Tone       Visit Diagnosis: Other symptoms and signs involving the nervous system  Other disturbances of skin sensation  Muscle weakness (generalized)  Abnormal posture  Stiffness of right shoulder, not elsewhere classified  Stiffness of left shoulder, not elsewhere classified  Stiffness of right wrist, not elsewhere classified  Stiffness of left wrist, not elsewhere classified    Problem List There are no active problems to display for this patient.   Quay Burow, OTR/L 12/16/2018, 5:51 PM  [facility] [address] [place], [place],  [address] Phone: [phone]   Fax:  [phone]  Name: Shanan Mcmiller MRN: [id number] Date of Birth: [birth year]

## 2018-12-16 NOTE — Therapy (Signed)
[facility] [address] [place], [place], [address] Phone: [phone]   Fax:  [phone]  Physical Therapy Treatment  Patient Details  Name: [name] MRN: [id number] Date of Birth: [birth year] Referring Provider (PT): Lanny CrampPatrick O'Brien, MD - referring physician.  [facility]  New PCP: Grayce SessionsMichelle P Edwards, NP   Encounter Date: 12/16/2018  PT End of Session - 12/16/18 1431    Visit Number  9    Number of Visits  16   eval + initial 3 visits from Medicaid   Date for PT Re-Evaluation  02/09/19    Authorization Type  Medicaid    Authorization Time Period  11/18/18-02/09/19; 12 visits    Authorization - Visit Number  5    Authorization - Number of Visits  12    PT Start Time  1018    PT Stop Time  1102    PT Time Calculation (min)  44 min    Equipment Utilized During Treatment  --   interpreter services utilized as well as rehab tech   Activity Tolerance  Patient tolerated treatment well    Behavior During Therapy  [facility] for tasks assessed/performed       Past Medical History:  Diagnosis Date  . Diabetes mellitus without complication (HCC)     History reviewed. No pertinent surgical history.  There were no vitals filed for this visit.  Subjective Assessment - 12/16/18 1420    Subjective  Week is going well; went to see physician about blisters on heels - healing well, did not feel they were dangerous.    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    Currently in Pain?  No/denies                       [facility] Adult PT Treatment/Exercise - 12/16/18 1421      Bed Mobility    Supine to Sit  Total Assistance - Patient < 25%    Sit to Supine  Total Assistance - Patient < 25%      Transfers   Transfers  Lateral/Scoot Transfers    Lateral/Scoot Transfers  With slide board;2: Max assist    Lateral Transfers: Patient Percentage  40%    Lateral/Scoot Transfer Details (indicate cue type and reason)  Therapist attempted to reduce amount of assistance today; pt unable to maintain forward flexion or initiate slideboard transfers.  Therapist returned assistance providing assistance to maintain anterior lean and weight shift and cues for trunk lateral movement to advance pelvis across board.    Comments  Focused on having pt set up wheelchair for slideboard transfer; pt able to perform set up with extra time MOD I.  Placed belt around LLE and attempted to have pt loop RUE through belt and lean to the R to assist with lifting LLE to place board under thigh.  Required assistance from therapist to hold LLE in air while he placed slideboard.  For sit > supine demonstrated to pt how to loop UE through belt and using his momentum as he leaned back into supine, pulling RLE up onto the mat; assistance from  therapist to bring LLE onto mat.  Reclined on wedge performed two reps of transitioning from reclined on wedge <> circle sitting using UE hooking through belts around thighs.  Total A first attempt, max A second attempt.  Remained in circle sitting to stretch hips and low back before next activity.      Therapeutic Activites    Therapeutic Activities  ADL's    ADL's  Utilized belt around thighs to assist with placing LE in circle sitting.  In circle sitting practiced sequence for donning and doffing pants by putting red theraband loop around each foot with assistance to lift each foot.  Once over each foot pt able to perform lateral leans to move band up to hips, unable to weight shift fully by leaning on elbows to bring belt over buttocks.  Pt able to remove down to feet but again required  assistance to lift each foot to pull band off each side.               PT Education - 12/16/18 1430    Education Details  slideboard transfer set up, slideboard transfer, transition to supine and circle sitting with use of simulated leg loop    Person(s) Educated  Patient;Spouse    Methods  Explanation;Demonstration    Comprehension  Verbalized understanding;Returned demonstration       PT Short Term Goals - 11/05/18 2215      PT SHORT TERM GOAL #1   Title  Patient will direct initial HEP independently to wife/caregivers for stretching LEs in order to prevent risk of contractures.    Baseline  have initiated cervical and shoulder ROM HEP; have not initiated stretching for LE    Time  6    Period  Weeks   visits   Status  Revised    Target Date  12/20/18      PT SHORT TERM GOAL #2   Title  Patient will perform sitting balance with A/P weight shifting with mod A in order to prepare for sliding board lateral transfers from mat <> w/c.    Baseline  total A in long sitting; short sitting can require as little as mod A but + 2 for safety    Time  6    Period  Weeks    Status  Revised    Target Date  12/20/18      PT SHORT TERM GOAL #3   Title  Patient will perform rolling right and left in bed with mod A in order to decrease caregiver burden and for pressure relief.    Baseline  total A    Time  6    Period  Weeks    Status  Revised    Target Date  12/20/18      PT SHORT TERM GOAL #4   Title  Patient will perform scoot pivot t/f on sliding board from w/c <> mat table (from one surface elevated with gravity assisting) with max A.    Baseline  +2 assistance - just cleared to WB through R wrist to assist with transfers    Time  6    Period  Weeks    Status  Revised    Target Date  12/20/18        PT Long Term Goals - 12/01/18 1736      PT LONG TERM GOAL #1   Title  Patient will direct final HEP independently to wife/caregivers for stretching LEs in order to prevent  risk of  contractures. ALL LTGS DUE BY 12TH VISIT OR 02/09/2019    Baseline  dependent    Time  12    Period  --   visits   Status  Revised      PT LONG TERM GOAL #2   Title  When pt receives his own w/c (vs. loaner chair) pt will demonstrate independence with w/c parts in order to safely prepare for transfers.    Baseline  not yet assessed    Time  12    Period  Weeks    Status  Revised      PT LONG TERM GOAL #3   Title  Patient will perform sitting balance with A/P and M/L weight shifting with supervision/min A in order to perform sliding board lateral transfers from mat <> w/c.    Baseline  currently max A for dynamic sitting balance    Time  12    Period  Weeks    Status  Revised      PT LONG TERM GOAL #4   Title  Patient will perform rolling right and left in bed with min A in order to decrease caregiver burden and for pressure relief.    Baseline  Total A    Time  12    Period  Weeks    Status  Revised      PT LONG TERM GOAL #5   Title  Patient will perform scoot pivot t/f on sliding board from w/c <> mat table on level surface with min-mod A.    Baseline  total A x2 with gravity assisting transfer    Time  12    Period  Weeks    Status  Revised            Plan - 12/16/18 1431    Clinical Impression Statement  Pt is demonstrating improved independence with set up for slideboard transfers; continues to require assistance to initiate transfer across board.  Simulated use of leg loops to see if it would assist or benefit pt with self management of LE during set up, transfers, bed mobility and ADL.  Began to simulate sequence for donning and doffing pants.  OT states that pt's bed is biggest barrier to him being able to carryover techniques at home.  Will search [employer] association to see if pt would be able to obtain a better, more stable bed.    PT Treatment/Interventions  ADLs/Self Care Home Management;DME Instruction;Functional mobility training;Therapeutic  activities;Therapeutic exercise;Balance training;Neuromuscular re-education;Wheelchair mobility training;Patient/family education;Passive range of motion;Energy conservation;Electrical Stimulation;Orthotic Fit/Training;Manual techniques    PT Next Visit Plan  Check STG.  Slideboard transfers with patient doing more.  Check HEP: LLE stretches, rolling with UE momentum; pushing up to sitting - long sitting, circle sitting. Sitting balance A/P and M/L, range of motion hips and shoulders, UE strengthening.    Consulted and Agree with Plan of Care  Patient;Family member/caregiver       Patient will benefit from skilled therapeutic intervention in order to improve the following deficits and impairments:  Decreased activity tolerance, Decreased balance, Decreased mobility, Decreased endurance, Decreased range of motion, Decreased strength, Impaired flexibility, Impaired sensation, Impaired UE functional use, Pain, Impaired tone, Postural dysfunction  Visit Diagnosis: Other symptoms and signs involving the nervous system  Other disturbances of skin sensation  Muscle weakness (generalized)  Abnormal posture  Quadriplegia, C1-C4 incomplete (HCC)     Problem List There are no active problems to display for this patient.   Dierdre Highman,  PT, DPT 12/16/18    2:38 PM    [facility] [address] [place], [place], [address] Phone: [phone]   Fax:  [phone]  Name: [name] MRN: [id number] Date of Birth: 08-23-1965

## 2018-12-22 NOTE — Telephone Encounter (Signed)
Call placed to [facility], spoke to [place] who confirmed that PCS assessment was completed 12/17/2018

## 2018-12-23 ENCOUNTER — Other Ambulatory Visit: Payer: Self-pay

## 2018-12-23 ENCOUNTER — Encounter: Payer: Self-pay | Admitting: Occupational Therapy

## 2018-12-23 ENCOUNTER — Ambulatory Visit: Payer: Medicaid Other

## 2018-12-23 ENCOUNTER — Ambulatory Visit: Payer: Medicaid Other | Admitting: Occupational Therapy

## 2018-12-23 DIAGNOSIS — R293 Abnormal posture: Secondary | ICD-10-CM

## 2018-12-23 DIAGNOSIS — M25611 Stiffness of right shoulder, not elsewhere classified: Secondary | ICD-10-CM

## 2018-12-23 DIAGNOSIS — M6281 Muscle weakness (generalized): Secondary | ICD-10-CM

## 2018-12-23 DIAGNOSIS — R29818 Other symptoms and signs involving the nervous system: Secondary | ICD-10-CM | POA: Diagnosis not present

## 2018-12-23 DIAGNOSIS — M25612 Stiffness of left shoulder, not elsewhere classified: Secondary | ICD-10-CM

## 2018-12-23 DIAGNOSIS — G8252 Quadriplegia, C1-C4 incomplete: Secondary | ICD-10-CM

## 2018-12-23 DIAGNOSIS — R208 Other disturbances of skin sensation: Secondary | ICD-10-CM

## 2018-12-23 DIAGNOSIS — M25632 Stiffness of left wrist, not elsewhere classified: Secondary | ICD-10-CM

## 2018-12-23 DIAGNOSIS — M25631 Stiffness of right wrist, not elsewhere classified: Secondary | ICD-10-CM

## 2018-12-23 NOTE — Therapy (Signed)
[facility] [address] [place], [place], [address] Phone: [phone]   Fax:  [phone]  Physical Therapy Treatment  Patient Details  Name: [name] MRN: [id number] Date of Birth: Jul 31, 1965 Referring Provider (PT): Doran Clay, MD - referring physician.  Patient to have care transferred to [facility].  New PCP: Kerin Perna, NP   Encounter Date: 12/23/2018  PT End of Session - 12/23/18 1021    Visit Number  10    Number of Visits  16   eval + initial 3 visits from Medicaid   Date for PT Re-Evaluation  02/09/19    Authorization Type  Medicaid    Authorization Time Period  11/18/18-02/09/19; 12 visits    Authorization - Visit Number  6    Authorization - Number of Visits  12    PT Start Time  2951    PT Stop Time  1102    PT Time Calculation (min)  44 min    Equipment Utilized During Treatment  --   interpreter services utilized   Activity Tolerance  Patient tolerated treatment well    Behavior During Therapy  WFL for tasks assessed/performed       Past Medical History:  Diagnosis Date  . Diabetes mellitus without complication ([place])     No past surgical history on file.  There were no vitals filed for this visit.  Subjective Assessment - 12/23/18 1403    Subjective  Pt reports that the loner powerchair will not be fixed but new chair should be here in December.    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    Currently in Pain?  No/denies                       [facility] Adult PT Treatment/Exercise - 12/23/18 1100      Bed Mobility   Bed Mobility  Rolling Right;Rolling Left    Rolling  Right  Maximal Assistance - Patient 25-49%    Rolling Left  Maximal Assistance - Patient 25-49%      Transfers   Transfers  Supine to Sit;Sit to Supine;Lateral/Scoot Transfers    Lateral/Scoot Transfers  2: Max assist;With slide board   2nd person for safety   Lateral Transfers: Patient Percentage  20%    Lateral/Scoot Transfer Details (indicate cue type and reason)  Pt was cued to push through arms and lean forward to help with transfer. Needed max assist to place and remove slideboard. Increased time to perform but patient did better maintaining anterior lean. One LOB posterior when got to edge of bed requiring 2nd person to catch total assist to return upright.    Supine to Sit  1: +2 Total assist    Sit to Supine  1: +2 Total assist   from short sit edge of mat   Sit to Supine Details (indicate cue type and reason)  Verbal cues for technique;Verbal cues for sequencing   cued to lean down on right elbow to assist with lowering   Sit to Supine Details   Pt perform sit to supine from long sit holding to legs max assist to control  lowering.    Comments  With rolling patient was cued to use arms with momentum to help with roll and reach across. PT positioned knee in flexion prior to roll and provided assist at pelvis.       Therapeutic Activites    Therapeutic Activities  Other Therapeutic Activities    ADL's  Sidelying with stabilizing reversals maintaining position x 30 sec each side. Prone on elbows with weight shifting side to side x 10 then scapular retraction/protraction x 10, then walking elbows forwards and backwards x 3 each side. Long sit with patient leaning forwards to maintain position x 1 min supervision/CGA. Long sit with patient reaching arms back behind to try to prop up on arms x 5 min assist to come back forward. Long sit with coming down on left elbow and pushing back up x 5.      Neuro Re-ed    Neuro Re-ed Details   Pt performed sitting edge of mat with feet supported with  stabilizing reversals to trunk with 5 sec holds 30 sec x 2 with hands in lap, sitting reaching for targets out front x 30 sec. 2nd person behind for safety but patient able to maintain upright close supervision. In sidelying pt maintained sidelying with PT applying stabilizing reversals with 5 sec holds x 5 each side. Pt in long sit with arms behind placed slightly posterior to try to hold himself upright min/mod assist to maintain about 10 sec then had patient try to push through arms to come back upright in long sit x 5 min/mod assist.      Exercises   Exercises  Other Exercises    Other Exercises   Pt's wife instructed in supine hamstring stretch and hip ER stretch 30 sec x 3. Instructed in positioning and how to utilize body to help support. Sitting in long sit leaning forward to allow hamstring stretch 30 sec x 3.              PT Education - 12/23/18 1410    Education Details  Pt and wife instructed in passive hamstring and hip ER stretch. Issued HEP    Person(s) Educated  Patient;Spouse    Methods  Explanation;Demonstration;Handout    Comprehension  Verbalized understanding       PT Short Term Goals - 12/23/18 1411      PT SHORT TERM GOAL #1   Title  Patient will direct initial HEP independently to wife/caregivers for stretching LEs in order to prevent risk of contractures. ALL STGS DUE BY 3RD VISIT - 11/05/18    Baseline  pt and wife have been instructed in initial HEP for LE stretching.    Time  3    Period  --   visits   Status  Achieved      PT SHORT TERM GOAL #2   Title  Patient will perform sitting balance with A/P weight shifting with mod A in order to prepare for sliding board lateral transfers from mat <> w/c.    Baseline  Long sit with trunk flexion CGA for safety, short sit edge of mat with reaching for targets close supervision to min assist for safety.    Status  Achieved      PT SHORT TERM GOAL #3   Title  Patient will perform rolling right and left in bed  with mod A in order to decrease caregiver burden and for pressure relief.    Baseline  max assist with rolling    Status  Partially Met      PT SHORT TERM GOAL #4   Title  Patient will perform scoot pivot t/f on sliding board from w/c <> mat table (from one surface elevated with gravity assisting) with max A.    Baseline  max assist but 2nd person for safety still.    Status  Partially Met        PT Long Term Goals - 12/01/18 1736      PT LONG TERM GOAL #1   Title  Patient will direct final HEP independently to wife/caregivers for stretching LEs in order to prevent risk of contractures. ALL LTGS DUE BY 12TH VISIT OR 02/09/2019    Baseline  dependent    Time  12    Period  --   visits   Status  Revised      PT LONG TERM GOAL #2   Title  When pt receives his own w/c (vs. loaner chair) pt will demonstrate independence with w/c parts in order to safely prepare for transfers.    Baseline  not yet assessed    Time  12    Period  Weeks    Status  Revised      PT LONG TERM GOAL #3   Title  Patient will perform sitting balance with A/P and M/L weight shifting with supervision/min A in order to perform sliding board lateral transfers from mat <> w/c.    Baseline  currently max A for dynamic sitting balance    Time  12    Period  Weeks    Status  Revised      PT LONG TERM GOAL #4   Title  Patient will perform rolling right and left in bed with min A in order to decrease caregiver burden and for pressure relief.    Baseline  Total A    Time  12    Period  Weeks    Status  Revised      PT LONG TERM GOAL #5   Title  Patient will perform scoot pivot t/f on sliding board from w/c <> mat table on level surface with min-mod A.    Baseline  total A x2 with gravity assisting transfer    Time  12    Period  Weeks    Status  Revised            Plan - 12/23/18 1415    Clinical Impression Statement  Pt continues to show improvement in sitting balance and able to use arms to support  more in long sit when leaning back some requiring less assistance to maintain.    PT Treatment/Interventions  ADLs/Self Care Home Management;DME Instruction;Functional mobility training;Therapeutic activities;Therapeutic exercise;Balance training;Neuromuscular re-education;Wheelchair mobility training;Patient/family education;Passive range of motion;Energy conservation;Electrical Stimulation;Orthotic Fit/Training;Manual techniques    PT Next Visit Plan  Slideboard transfers with patient doing more.  Check HEP- how are stretches going with wife?  rolling with UE momentum; pushing up to sitting - long sitting, circle sitting. Sitting balance A/P and M/L, range of motion hips and shoulders, UE strengthening.    Consulted and Agree with Plan of Care  Patient;Family member/caregiver       Patient will benefit from skilled therapeutic intervention in order to improve the following deficits and impairments:  Decreased activity tolerance, Decreased balance, Decreased mobility, Decreased endurance, Decreased range of motion, Decreased strength, Impaired flexibility, Impaired sensation, Impaired UE functional use, Pain, Impaired tone, Postural dysfunction  Visit Diagnosis: Muscle weakness (generalized)  Abnormal posture  Quadriplegia, C1-C4 incomplete ([place])     Problem List There are no active problems to display for this patient.   Electa Sniff, PT, DPT, NCS 12/23/2018, 2:20 PM  [facility] [address] [place], [place], [address] Phone: [phone]   Fax:  [phone]  Name: [name] MRN: [id number] Date of Birth: Jul 09, 1965

## 2018-12-23 NOTE — Patient Instructions (Signed)
Access Code: [email or web address]  URL: [email or web address]  Date: 12/23/2018  Prepared by: Cherly Anderson   Exercises Supine Shoulder Flexion AAROM - 10 reps - 1 sets - 1x daily - 7x weekly Supine Hip Adductor Stretch - 3 reps - 1 sets - 30 seconds hold - 1x daily - 7x weekly Supine Ankle Dorsiflexion Stretch with Caregiver - 3 reps - 1 sets - 30 seconds hold - 1x daily - 7x weekly Supine Hamstring Stretch with Caregiver - 4 reps - 1 sets - 1 min hold - 2x daily - 7x weekly Hip ER stretch - 4 reps - 1 sets - 1 min hold - 2x daily - 7x weekly

## 2018-12-23 NOTE — Therapy (Signed)
[facility] [address] [place], [place], [address] Phone: [phone]   Fax:  [phone]  Occupational Therapy Treatment  Patient Details  Name: [name] MRN: [id number] Date of Birth: Jun 09, 1965 Referring Provider (OT): Dr. Werner Lean   Encounter Date: 12/23/2018  OT End of Session - 12/23/18 1229    Visit Number  8    Number of Visits  13    Date for OT Re-Evaluation  01/28/19    Authorization Type  Medicaid    Authorization Time Period  12 visits  through 01/27/2019    Authorization - Visit Number  8    Authorization - Number of Visits  12    OT Start Time  1101    OT Stop Time  1145    OT Time Calculation (min)  44 min    Activity Tolerance  Patient tolerated treatment well       Past Medical History:  Diagnosis Date  . Diabetes mellitus without complication ([place])     History reviewed. No pertinent surgical history.  There were no vitals filed for this visit.  Subjective Assessment - 12/23/18 1220    Subjective   My arms are getting stronger I can tell    Pertinent History  1 burst fx with [place] transection s/p C4-T6 spinal fusion (6/10), incomplete para deficits and s/p distal radius fx. Pt was d/c home with family on 09/24/2018.  Pt's PMH is significant for: T2DM, recent LE DVT    Limitations  cleared for AA/ROM, P/ROM and strengthening to RUE, (pt was originally NWB, will plan to perform WB to tolerance as pt is now greater than 12 weeks post injury and cleared for strengthening.    Patient Stated Goals  get better    Currently in Pain?  No/denies                   OT Treatments/Exercises (OP) - 12/23/18 1221      ADLs   Functional Mobility  Addressed moving from supine to long sitting with max ax1, min a x 1. Also addressed using leg loops to move legs in order to move from long sitting to EOM - pt needs cues and min a x2 (min a for balance and min a to move legs).  [place] board  transfer from mat to wheelchair placed on pt's right - pt needs min a to move from sitting to sidesitting propped on L elbow in order to place board,  Pt required mod a for sliding board transfer and then moderate assist to straighten pelvis in wheelchair.  Pt's overall activity tolerance is improving.      Shoulder Exercises: Supine   Other Supine Exercises  Upgraded pt's HEP for UE strengthening.  After practice and instruction as well as assessing tolerance, issued pt green and red theraband (pt only has yellow at home). Pt to complete horizontal ab/adduction and shoulder flexion in supine using green theraband and in sitting using red theraband. Pt also able to now use 3 pound weight for shoulder flexion in supine as well as bicep curls in sitting. Discussed using seat belt at all times as well as wife providing supervision due to decreased trunk control. Pt tolerated all activities and reports no shoulder pain.  Pt also stated he is doing his putty HEP at home every day.              OT Education - 12/23/18 1227    Education Details  upgraded  HEP For UE's using red/green theraband as well as increasing to 3 pound dumb bells.    Person(s) Educated  Patient    Methods  Explanation;Demonstration    Comprehension  Verbalized understanding;Returned demonstration       OT Short Term Goals - 12/23/18 1227      OT SHORT TERM GOAL #1   Title  I with inital HEP- 12/14/2018    Baseline  dependent    Time  6    Period  Weeks    Status  Achieved    Target Date  12/14/18      OT SHORT TERM GOAL #2   Title  Pt will donn shirt with min A    Baseline  mod A    Time  6    Period  Weeks    Status  Achieved   in wheelchair     OT SHORT TERM GOAL #3   Title  Pt will demonstrate bilateral wrist flexion/ extension WFLs for ADLs/ IADLS.    Baseline  45/45 wrist flexion/ extension bilateral UE's    Time  6    Period  Weeks    Status  Partially Met   12/16/2018 LUE =58* and RUE = 60* however pt  still with pain end ROM for R wrist     OT SHORT TERM GOAL #4   Title  Pt will perform LB dressing with mod A    Baseline  dependent    Time  6    Period  Weeks    Status  On-going      OT SHORT TERM GOAL #5   Title  Pt will perform UB bathing with Min A, and LB bathing with mod A    Baseline  UB mod A, LB total A in bed    Time  6    Period  Weeks    Status  Achieved   12/16/2018 in long sitting in clinic; pt's hospital bed is poor and does not allow pt to complete at home. Attempting to contact [employer] to determine if more suitable hospital bed can be issued. WIfe to call clinic with rep name     OT SHORT TERM GOAL #6   Title  Pt will demonstrate ability to retireve a lightweight object at 110 shoulder flexion for RUE for increased ease with ADLs.    Baseline  95* shoulder flexion    Time  6    Period  Weeks    Status  On-going      OT SHORT TERM GOAL #7   Title  Pt will demonstrate ability to retrieve a lightweight object at 115* shoulder flexion  with LUE for increased ease with ADLs,    Baseline  105*    Time  6    Period  Weeks    Status  On-going        OT Long Term Goals - 12/23/18 1228      OT LONG TERM GOAL #1   Title  Pt will donn shirt with setup-01/28/2019    Baseline  mod A    Time  12    Period  Weeks    Status  On-going      OT LONG TERM GOAL #2   Title  Pt will perform LB dressing with min A    Baseline  dependent/ total A    Time  12    Period  Weeks    Status  On-going  OT LONG TERM GOAL #3   Title  Pt will transfer to [facility] with mod A using sliding board    Baseline  total A +2 for basic transfers    Time  12    Period  Weeks    Status  On-going      OT LONG TERM GOAL #4   Title  Pt and family will verbalize understanding regarding recommendations for shower chair, DME    Baseline  dependent, pt does not yet have shower chair, currently bathing in bed    Time  12    Period  Weeks    Status  On-going      OT LONG TERM GOAL #5    Title  Pt will increase bilateral grip strength by 10 lbs for increased ease with ADLs/ IADLS.    Baseline  RUE grip 5 lbs, LUE 3 lbs    Time  12    Period  Weeks    Status  On-going            Plan - 12/23/18 1228    Clinical Impression Statement  Pt continues to progress toward goals. Pt demonstrating improved strength and AROM for BUE's for functional reach and in using BUE"s to assist with mobility    OT Occupational Profile and History  Detailed Assessment- Review of Records and additional review of physical, cognitive, psychosocial history related to current functional performance    Occupational performance deficits (Please refer to evaluation for details):  ADL's;IADL's;Work;Leisure;Social Participation    Body Structure / Function / Physical Skills  ADL;UE functional use;Endurance;Balance;Flexibility;Pain;FMC;ROM;GMC;Coordination;Sensation;Decreased knowledge of precautions;Decreased knowledge of use of DME;IADL;Strength;Dexterity;Mobility;Tone    Rehab Potential  Good    Clinical Decision Making  Several treatment options, min-mod task modification necessary    Comorbidities Affecting Occupational Performance:  May have comorbidities impacting occupational performance    Modification or Assistance to Complete Evaluation   No modification of tasks or assist necessary to complete eval    OT Frequency  1x / week    OT Duration  12 weeks    OT Treatment/Interventions  Self-care/ADL training;Ultrasound;Energy conservation;Aquatic Therapy;DME and/or AE instruction;Patient/family education;Paraffin;Gait Training;Passive range of motion;Balance training;Fluidtherapy;Cryotherapy;Splinting;Therapist, nutritional;Contrast Bath;Electrical Stimulation;Moist Heat;Therapeutic exercise;Manual Therapy;Therapeutic activities;Cognitive remediation/compensation;Neuromuscular education    Plan  UE strengthening as able, shoulder and scapular ROM, long sit  for ADLs, incorporate functional use of  UE's at home, functional mobility    Consulted and Agree with Plan of Care  Patient;Family member/caregiver    Family Member Consulted  wife,interpreter via video       Patient will benefit from skilled therapeutic intervention in order to improve the following deficits and impairments:   Body Structure / Function / Physical Skills: ADL, UE functional use, Endurance, Balance, Flexibility, Pain, FMC, ROM, GMC, Coordination, Sensation, Decreased knowledge of precautions, Decreased knowledge of use of DME, IADL, Strength, Dexterity, Mobility, Tone       Visit Diagnosis: Other symptoms and signs involving the nervous system  Other disturbances of skin sensation  Muscle weakness (generalized)  Abnormal posture  Stiffness of right shoulder, not elsewhere classified  Stiffness of left shoulder, not elsewhere classified  Stiffness of right wrist, not elsewhere classified  Stiffness of left wrist, not elsewhere classified    Problem List There are no active problems to display for this patient.   Quay Burow , OTR/L 12/23/2018, 12:31 PM  [facility] [address] [place], [place], [address] Phone: [phone]   Fax:  [phone]  Name: [name] MRN: [id number] Date of Birth: [birth year]

## 2018-12-24 ENCOUNTER — Encounter: Payer: Medicaid Other | Admitting: Occupational Therapy

## 2018-12-24 ENCOUNTER — Ambulatory Visit: Payer: Medicaid Other

## 2018-12-24 ENCOUNTER — Ambulatory Visit: Payer: Self-pay | Admitting: Neurology

## 2018-12-30 ENCOUNTER — Telehealth: Payer: Self-pay | Admitting: Neurology

## 2018-12-30 ENCOUNTER — Encounter: Payer: Medicaid Other | Admitting: Occupational Therapy

## 2018-12-30 ENCOUNTER — Ambulatory Visit: Payer: Medicaid Other

## 2018-12-30 ENCOUNTER — Other Ambulatory Visit: Payer: Self-pay

## 2018-12-30 ENCOUNTER — Encounter: Payer: Self-pay | Admitting: Neurology

## 2018-12-30 ENCOUNTER — Ambulatory Visit: Payer: Medicaid Other | Admitting: Neurology

## 2018-12-30 VITALS — BP 118/73 | HR 56 | Temp 97.7°F

## 2018-12-30 DIAGNOSIS — G8250 Quadriplegia, unspecified: Secondary | ICD-10-CM | POA: Diagnosis not present

## 2018-12-30 DIAGNOSIS — S24101A Unspecified injury at T1 level of thoracic spinal cord, initial encounter: Secondary | ICD-10-CM | POA: Insufficient documentation

## 2018-12-30 MED ORDER — OXYCODONE HCL 5 MG PO TABS: 5.0000 mg | ORAL_TABLET | Freq: Four times a day (QID) | ORAL | 0 refills | Status: DC | PRN

## 2018-12-30 NOTE — Telephone Encounter (Signed)
Pharmacist Tye Maryland called to inform of a partial fill on pt's oxyCODONE (OXY IR/ROXICODONE) 5 MG immediate release tablet due to insurance only allowing 28 to be released today pt may get remaining later in the month. Pharmacist Tye Maryland stated she is required to relay this information.

## 2018-12-30 NOTE — Patient Instructions (Signed)
[facility] Physical Medicine and Rehabilitation Dr. Alger Simons  Address: [address], [place], [place] [address] Phone: [phone]
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
## 2018-12-31 ENCOUNTER — Ambulatory Visit: Payer: Medicaid Other | Admitting: Physical Therapy

## 2018-12-31 ENCOUNTER — Encounter: Payer: Medicaid Other | Admitting: Occupational Therapy

## 2019-01-04 ENCOUNTER — Encounter: Payer: Self-pay | Admitting: Physical Therapy

## 2019-01-04 ENCOUNTER — Ambulatory Visit: Payer: Medicaid Other | Admitting: Physical Therapy

## 2019-01-04 ENCOUNTER — Encounter: Payer: Self-pay | Admitting: Occupational Therapy

## 2019-01-04 ENCOUNTER — Other Ambulatory Visit: Payer: Self-pay

## 2019-01-04 ENCOUNTER — Ambulatory Visit: Payer: Medicaid Other | Admitting: Occupational Therapy

## 2019-01-04 DIAGNOSIS — R29818 Other symptoms and signs involving the nervous system: Secondary | ICD-10-CM

## 2019-01-04 DIAGNOSIS — G8252 Quadriplegia, C1-C4 incomplete: Secondary | ICD-10-CM

## 2019-01-04 DIAGNOSIS — R208 Other disturbances of skin sensation: Secondary | ICD-10-CM

## 2019-01-04 DIAGNOSIS — R293 Abnormal posture: Secondary | ICD-10-CM

## 2019-01-04 DIAGNOSIS — M6281 Muscle weakness (generalized): Secondary | ICD-10-CM

## 2019-01-04 DIAGNOSIS — M25631 Stiffness of right wrist, not elsewhere classified: Secondary | ICD-10-CM

## 2019-01-04 DIAGNOSIS — M25611 Stiffness of right shoulder, not elsewhere classified: Secondary | ICD-10-CM

## 2019-01-04 DIAGNOSIS — M25632 Stiffness of left wrist, not elsewhere classified: Secondary | ICD-10-CM

## 2019-01-04 DIAGNOSIS — M25612 Stiffness of left shoulder, not elsewhere classified: Secondary | ICD-10-CM

## 2019-01-04 DIAGNOSIS — M542 Cervicalgia: Secondary | ICD-10-CM

## 2019-01-04 NOTE — Therapy (Signed)
[facility] [address] [place], [place], [address] Phone: [phone]   Fax:  [phone]  Physical Therapy Treatment  Patient Details  Name: [name] MRN: [id number] Date of Birth: April 05, 1965 Referring Provider (PT): Doran Clay, MD - referring physician.  Patient to have care transferred to [facility].  New PCP: Kerin Perna, NP   Encounter Date: 01/04/2019  PT End of Session - 01/04/19 1444    Visit Number  11    Number of Visits  16   eval + initial 3 visits from Medicaid   Date for PT Re-Evaluation  02/09/19    Authorization Type  Medicaid    Authorization Time Period  11/18/18-02/09/19; 12 visits    Authorization - Visit Number  7    Authorization - Number of Visits  12    PT Start Time  4239    PT Stop Time  1320    PT Time Calculation (min)  41 min    Equipment Utilized During Treatment  --   interpreter services utilized   Activity Tolerance  Patient tolerated treatment well    Behavior During Therapy  WFL for tasks assessed/performed       Past Medical History:  Diagnosis Date  . Diabetes mellitus without complication ([place])   . History of cervical fracture     Past Surgical History:  Procedure Laterality Date  . CERVICAL SPINE SURGERY      There were no vitals filed for this visit.  Subjective Assessment - 01/04/19 1026    Subjective  Had appointment with neurologist last week.  Neurologist referring pt to spinal cord specialist.  No changes except she prescribed medication for pain.  Uses it when he has severe back pain.  No specific delivery date for the permanent w/c.  Continued discussion regarding hospital bed and attempting to get a better bed to assist with ADLs and mobility.  Requested wife look at bed and see if there is contact information on the bed for the correct company.    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                       [facility] PT Treatment/Exercise - 01/04/19 1035      Transfers   Transfers  Lateral/Scoot Transfers    Lateral/Scoot Transfers  3: Mod assist    Lateral Transfers: Patient Percentage  40%    Lateral/Scoot Transfer Details (indicate cue type and reason)  From R > L w/c > mat with slideboard with therapist assisting with placing board and maintaining anterior lean and weight shift; with therapist counting pt able to push through UE and utilize trunk rotation and momentum to advance across board.  Therapist provided some assistance to unweight hips.      Therapeutic Activites    Therapeutic Activities  Other Therapeutic Activities    Other Therapeutic Activities  Discussed with pt and wife via interpreter: PT and OT recommendations to schedule appointment with physiatrist that specialized in SCI; OT to provide contact information.  Discussed OT's attempt to contact company that provided bed - [employer] was not the company that  provided the hospital bed at home.  Wife states they did not receive paperwork with the bed; PT requested wife go home and look to see if there is a company sticker on the bed and to write down information or take a picture for therapy to contact regarding a different type of bed to allow pt to be more independent with mobility and ADL.  Discussed plan for visits in the new year which may emphasize PT over OT for mobility training if unable to obtain a new bed.  Pt and wife verbalized understanding.  Pt states at inpatient rehab facility he would use a standing machine and electricity on his leg; asking why therapy had not utilized this here.  Discussed that therapy would likely utilize standing frame  in the future to focus on trunk control, balance and UE strengthening but PT's primary focus was to address balance and mobility and attempt to gain greater independence with those tasks first due to limited visits.  Pt verbalized understanding.      Shoulder Exercises: Seated   Other Seated Exercises  Seated with bilat foot support and UE supported on 4" blocks beside hips performed 10 reps anterior lean with tricep extension to initiate tricep push ups with therapist providing min-mod A from the front for full weight shift off of hips.  Continued functional tricep strengthening with 8 reps leaning down to elbow on 4" block to R and L side with contralateral UE reaching across midline for 2lb weight and then pushing back up to sitting to reach laterally out of BOS to place 2lb weight to the side.  Pt demonstrated increased difficulty with pushing back up to sitting from L elbow and reaching to R side without LOB - required therapist's assistance to maintain upright trunk.      Neck Exercises: Stretches   Upper Trapezius Stretch  1 rep;60 seconds;Left    Upper Trapezius Stretch Limitations  after treatment session due to pt reporting mm spasm on L side of neck/shoulder.  Therapist provided manual facilitation of L shoulder depression and side flexion to R             PT Education - 01/04/19 1444    Education Details  See TA    Person(s) Educated  Patient;Spouse    Methods  Explanation    Comprehension  Verbalized understanding       PT Short Term Goals - 12/23/18 1411      PT SHORT TERM GOAL #1   Title  Patient will direct initial HEP independently to wife/caregivers for stretching LEs in order to prevent risk of contractures. ALL STGS DUE BY 3RD VISIT - 11/05/18    Baseline  pt and wife have been instructed in initial HEP for LE stretching.    Time  3    Period  --   visits   Status  Achieved      PT SHORT TERM GOAL #2   Title  Patient will perform sitting balance with A/P weight  shifting with mod A in order to prepare for sliding board lateral transfers from mat <> w/c.    Baseline  Long sit with trunk flexion CGA for safety, short sit edge of mat with reaching for targets close supervision to min assist for safety.    Status  Achieved      PT SHORT TERM GOAL #3   Title  Patient will perform rolling right and left in bed with mod A in order to decrease caregiver burden  and for pressure relief.    Baseline  max assist with rolling    Status  Partially Met      PT SHORT TERM GOAL #4   Title  Patient will perform scoot pivot t/f on sliding board from w/c <> mat table (from one surface elevated with gravity assisting) with max A.    Baseline  max assist but 2nd person for safety still.    Status  Partially Met        PT Long Term Goals - 12/01/18 1736      PT LONG TERM GOAL #1   Title  Patient will direct final HEP independently to wife/caregivers for stretching LEs in order to prevent risk of contractures. ALL LTGS DUE BY 12TH VISIT OR 02/09/2019    Baseline  dependent    Time  12    Period  --   visits   Status  Revised      PT LONG TERM GOAL #2   Title  When pt receives his own w/c (vs. loaner chair) pt will demonstrate independence with w/c parts in order to safely prepare for transfers.    Baseline  not yet assessed    Time  12    Period  Weeks    Status  Revised      PT LONG TERM GOAL #3   Title  Patient will perform sitting balance with A/P and M/L weight shifting with supervision/min A in order to perform sliding board lateral transfers from mat <> w/c.    Baseline  currently max A for dynamic sitting balance    Time  12    Period  Weeks    Status  Revised      PT LONG TERM GOAL #4   Title  Patient will perform rolling right and left in bed with min A in order to decrease caregiver burden and for pressure relief.    Baseline  Total A    Time  12    Period  Weeks    Status  Revised      PT LONG TERM GOAL #5   Title  Patient will perform  scoot pivot t/f on sliding board from w/c <> mat table on level surface with min-mod A.    Baseline  total A x2 with gravity assisting transfer    Time  12    Period  Weeks    Status  Revised            Plan - 01/04/19 1621    Clinical Impression Statement  Pt continues to demonstrate improvement in UE strength and use of UE and trunk movement to assist with transfers and changes in position.  Continues to demonstrate improvements in dynamic sitting balance to allow pt to lift objects with weight, across midline and out of BOS.  Will continue to progress to improve independence and decrease burden of care.    PT Treatment/Interventions  ADLs/Self Care Home Management;DME Instruction;Functional mobility training;Therapeutic activities;Therapeutic exercise;Balance training;Neuromuscular re-education;Wheelchair mobility training;Patient/family education;Passive range of motion;Energy conservation;Electrical Stimulation;Orthotic Fit/Training;Manual techniques    PT Next Visit Plan  Slideboard transfers with patient doing more.  Check HEP- how are stretches going with wife?  rolling with UE momentum; pushing up to sitting - long sitting, circle sitting. Short Sitting balance A/P and M/L, range of motion hips and shoulders, UE strengthening.  Management of LE during transfers and bed mobility    Consulted and Agree with Plan of Care  Patient;Family member/caregiver  Patient will benefit from skilled therapeutic intervention in order to improve the following deficits and impairments:  Decreased activity tolerance, Decreased balance, Decreased mobility, Decreased endurance, Decreased range of motion, Decreased strength, Impaired flexibility, Impaired sensation, Impaired UE functional use, Pain, Impaired tone, Postural dysfunction  Visit Diagnosis: Other symptoms and signs involving the nervous system  Other disturbances of skin sensation  Muscle weakness (generalized)  Abnormal  posture  Quadriplegia, C1-C4 incomplete ([place])  Cervicalgia     Problem List Patient Active Problem List   Diagnosis Date Noted  . Spinal cord injury at T1-T6 level ([place]) 12/30/2018  . Quadriplegia ([place]) 12/30/2018   Rico Junker, PT, DPT 01/04/19    4:26 PM    [facility] [address] [place], [place], [address] Phone: [phone]   Fax:  [phone]  Name: [name] MRN: [id number] Date of Birth: 1965-03-28

## 2019-01-04 NOTE — Therapy (Signed)
[facility] [address] [place], [place], [address] Phone: [phone]   Fax:  [phone]  Occupational Therapy Treatment  Patient Details  Name: [name] MRN: [id number] Date of Birth: 10/07/1965 Referring Provider (OT): Dr. Werner Lean   Encounter Date: 01/04/2019  OT End of Session - 01/04/19 1238    Visit Number  9    Number of Visits  13    Date for OT Re-Evaluation  01/28/19    Authorization Type  Medicaid    Authorization Time Period  12 visits  through 01/27/2019    Authorization - Visit Number  9    Authorization - Number of Visits  12    OT Start Time  1102    OT Stop Time  1150    OT Time Calculation (min)  48 min    Activity Tolerance  Patient tolerated treatment well       Past Medical History:  Diagnosis Date  . Diabetes mellitus without complication ([place])   . History of cervical fracture     Past Surgical History:  Procedure Laterality Date  . CERVICAL SPINE SURGERY      There were no vitals filed for this visit.  Subjective Assessment - 01/04/19 1105    Subjective   We get confused with what is happening with the equipment    Pertinent History  1 burst fx with [place] transection s/p C4-T6 spinal fusion (6/10), incomplete para deficits and s/p distal radius fx. Pt was d/c home with family on 09/24/2018.  Pt's PMH is significant for: T2DM, recent LE DVT    Limitations  cleared for AA/ROM, P/ROM and strengthening to RUE, (pt was originally NWB, will plan to perform WB to tolerance as pt is now greater than 12 weeks post injury and cleared for strengthening.    Patient Stated Goals  get better                   OT Treatments/Exercises (OP) - 01/04/19 0001      ADLs   Overall ADLs  Discussed with pt and wife referral for physiatry - recommended MD for this referral and pt and wife in agreement. WIll provide contact info next session and encourage pt and wife to call for appt. Will  also send a message to physiatrist so that she will be familiar with pt's name. Both are in agreement for this service.  Pt also asked if there were any funds to asssist with bathroom remodiling - will provide info on [facility] program next session - pt and wife understand there is a waiting list.  Assessed grip strenth - see goals.     Bathing  Showed pt and wife padded transfer tub bench with cut out - will obtain order from MD in order to ensure Medicaid payment with this. Pt's transfers are improving and feel pt can begin to practice transfer tub bench transfers in clinic. Pt does bowel program at night - suggested pt may want to shower at night and do bowel program using cut out bench - wife reports "this would be good because he is having a harder time going in the bed."  Discussed with pt and wife need to communicate with MD if this becomes problematic - both verbalized understanding.      Functional Mobility  Mat to wheelchair transfer sliding board with min a x 1 flat surface     ADL Comments  Clarified with pt which company power wheechair is  provided by and which company bed was provided by.  Encouraged pt and wife to contactu [employer] weekly to determine when Medicaid has approved pt's power chair. Wife also states they are sending a list of companies to choose from for  PCA - wiife to bring in list next session to ask for help.               OT Education - 01/04/19 1234    Education Details  padded transfer tub bench, physiatry order/service    Person(s) Educated  Patient;Spouse    Methods  Explanation    Comprehension  Verbalized understanding;Returned demonstration       OT Short Term Goals - 01/04/19 1234      OT SHORT TERM GOAL #1   Title  I with inital HEP- 12/14/2018    Baseline  dependent    Time  6    Period  Weeks    Status  Achieved    Target Date  12/14/18      OT SHORT TERM GOAL #2   Title  Pt will donn shirt with min A    Baseline  mod A    Time  6     Period  Weeks    Status  Achieved   in wheelchair     OT SHORT TERM GOAL #3   Title  Pt will demonstrate bilateral wrist flexion/ extension WFLs for ADLs/ IADLS.    Baseline  45/45 wrist flexion/ extension bilateral UE's    Time  6    Period  Weeks    Status  Partially Met   12/16/2018 LUE =58* and RUE = 60* however pt still with pain end ROM for R wrist     OT SHORT TERM GOAL #4   Title  Pt will perform LB dressing with mod A    Baseline  dependent    Time  6    Period  Weeks    Status  On-going      OT SHORT TERM GOAL #5   Title  Pt will perform UB bathing with Min A, and LB bathing with mod A    Baseline  UB mod A, LB total A in bed    Time  6    Period  Weeks    Status  Achieved   12/16/2018 in long sitting in clinic; pt's hospital bed is poor and does not allow pt to complete at home. Attempting to contact [employer] to determine if more suitable hospital bed can be issued. WIfe to call clinic with rep name     OT SHORT TERM GOAL #6   Title  Pt will demonstrate ability to retireve a lightweight object at 110 shoulder flexion for RUE for increased ease with ADLs.    Baseline  95* shoulder flexion    Time  6    Period  Weeks    Status  On-going      OT SHORT TERM GOAL #7   Title  Pt will demonstrate ability to retrieve a lightweight object at 115* shoulder flexion  with LUE for increased ease with ADLs,    Baseline  105*    Time  6    Period  Weeks    Status  On-going        OT Long Term Goals - 01/04/19 1234      OT LONG TERM GOAL #1   Title  Pt will donn shirt with setup-01/28/2019    Baseline  mod A    Time  12    Period  Weeks    Status  On-going      OT LONG TERM GOAL #2   Title  Pt will perform LB dressing with min A    Baseline  dependent/ total A    Time  12    Period  Weeks    Status  On-going      OT LONG TERM GOAL #3   Title  Pt will transfer to [facility] with mod A using sliding board    Baseline  total A +2 for basic transfers    Time  12     Period  Weeks    Status  On-going      OT LONG TERM GOAL #4   Title  Pt and family will verbalize understanding regarding recommendations for shower chair, DME    Baseline  dependent, pt does not yet have shower chair, currently bathing in bed    Time  12    Period  Weeks    Status  On-going      OT LONG TERM GOAL #5   Title  Pt will increase bilateral grip strength by 10 lbs for increased ease with ADLs/ IADLS.    Baseline  RUE grip 5 lbs, LUE 3 lbs    Time  12    Period  Weeks    Status  On-going   01/04/2019 R = 26 pounds, L = 3 pounds           Plan - 01/04/19 1236    Clinical Impression Statement  Pt progressing toward goals. Pt reports his kitchen is accessible and he can now get himself a drink from the refridgerator    OT Occupational Profile and History  Detailed Assessment- Review of Records and additional review of physical, cognitive, psychosocial history related to current functional performance    Occupational performance deficits (Please refer to evaluation for details):  ADL's;IADL's;Work;Leisure;Social Participation    Body Structure / Function / Physical Skills  ADL;UE functional use;Endurance;Balance;Flexibility;Pain;FMC;ROM;GMC;Coordination;Sensation;Decreased knowledge of precautions;Decreased knowledge of use of DME;IADL;Strength;Dexterity;Mobility;Tone    Rehab Potential  Good    Clinical Decision Making  Several treatment options, min-mod task modification necessary    Comorbidities Affecting Occupational Performance:  May have comorbidities impacting occupational performance    Modification or Assistance to Complete Evaluation   No modification of tasks or assist necessary to complete eval    OT Frequency  1x / week    OT Duration  12 weeks    OT Treatment/Interventions  Self-care/ADL training;Ultrasound;Energy conservation;Aquatic Therapy;DME and/or AE instruction;Patient/family education;Paraffin;Gait Training;Passive range of motion;Balance  training;Fluidtherapy;Cryotherapy;Splinting;Therapist, nutritional;Contrast Bath;Electrical Stimulation;Moist Heat;Therapeutic exercise;Manual Therapy;Therapeutic activities;Cognitive remediation/compensation;Neuromuscular education    Plan  give info on Independent living and physiatry contact info, functional reaching into cabinets high and low, UE strengthening as able, shoulder and scapular ROM, long sit  for ADLs, incorporate functional use of UE's at home, functional mobility    Consulted and Agree with Plan of Care  Patient;Family member/caregiver    Family Member Consulted  wife,interpreter via video       Patient will benefit from skilled therapeutic intervention in order to improve the following deficits and impairments:   Body Structure / Function / Physical Skills: ADL, UE functional use, Endurance, Balance, Flexibility, Pain, FMC, ROM, GMC, Coordination, Sensation, Decreased knowledge of precautions, Decreased knowledge of use of DME, IADL, Strength, Dexterity, Mobility, Tone       Visit Diagnosis: Other symptoms and signs  involving the nervous system  Other disturbances of skin sensation  Muscle weakness (generalized)  Abnormal posture  Stiffness of right shoulder, not elsewhere classified  Stiffness of left shoulder, not elsewhere classified  Stiffness of right wrist, not elsewhere classified  Stiffness of left wrist, not elsewhere classified    Problem List Patient Active Problem List   Diagnosis Date Noted  . Spinal cord injury at T1-T6 level ([place]) 12/30/2018  . Quadriplegia ([place]) 12/30/2018    Quay Burow, OTR/L 01/04/2019, 12:40 PM  [facility] [address] [place], [place], [address] Phone: [phone]   Fax:  [phone]  Name: [name] MRN: [id number] Date of Birth: 03-29-1965

## 2019-01-05 ENCOUNTER — Other Ambulatory Visit (INDEPENDENT_AMBULATORY_CARE_PROVIDER_SITE_OTHER): Payer: Self-pay | Admitting: Primary Care

## 2019-01-05 MED ORDER — BLOOD PRESSURE MONITOR DELUXE KIT: 1.0000 | PACK | Freq: Three times a day (TID) | 0 refills | Status: DC

## 2019-01-11 ENCOUNTER — Other Ambulatory Visit (INDEPENDENT_AMBULATORY_CARE_PROVIDER_SITE_OTHER): Payer: Self-pay | Admitting: Primary Care

## 2019-01-11 DIAGNOSIS — G8250 Quadriplegia, unspecified: Secondary | ICD-10-CM

## 2019-01-12 NOTE — Addendum Note (Signed)
Addended by: Marcial Pacas on: 01/12/2019 07:56 AM   Modules accepted: Orders

## 2019-01-13 ENCOUNTER — Ambulatory Visit: Payer: Medicaid Other

## 2019-01-13 ENCOUNTER — Ambulatory Visit: Payer: Medicaid Other | Admitting: Occupational Therapy

## 2019-01-20 ENCOUNTER — Encounter: Payer: Self-pay | Admitting: Occupational Therapy

## 2019-01-20 ENCOUNTER — Other Ambulatory Visit: Payer: Self-pay

## 2019-01-20 ENCOUNTER — Ambulatory Visit: Payer: Medicaid Other | Attending: Physical Medicine and Rehabilitation

## 2019-01-20 ENCOUNTER — Ambulatory Visit: Payer: Medicaid Other | Admitting: Occupational Therapy

## 2019-01-20 DIAGNOSIS — M6281 Muscle weakness (generalized): Secondary | ICD-10-CM

## 2019-01-20 DIAGNOSIS — M25631 Stiffness of right wrist, not elsewhere classified: Secondary | ICD-10-CM

## 2019-01-20 DIAGNOSIS — M25612 Stiffness of left shoulder, not elsewhere classified: Secondary | ICD-10-CM | POA: Insufficient documentation

## 2019-01-20 DIAGNOSIS — M25611 Stiffness of right shoulder, not elsewhere classified: Secondary | ICD-10-CM | POA: Diagnosis present

## 2019-01-20 DIAGNOSIS — M542 Cervicalgia: Secondary | ICD-10-CM | POA: Diagnosis present

## 2019-01-20 DIAGNOSIS — R29818 Other symptoms and signs involving the nervous system: Secondary | ICD-10-CM | POA: Diagnosis present

## 2019-01-20 DIAGNOSIS — M25632 Stiffness of left wrist, not elsewhere classified: Secondary | ICD-10-CM | POA: Diagnosis present

## 2019-01-20 DIAGNOSIS — R293 Abnormal posture: Secondary | ICD-10-CM | POA: Insufficient documentation

## 2019-01-20 DIAGNOSIS — G8252 Quadriplegia, C1-C4 incomplete: Secondary | ICD-10-CM | POA: Diagnosis present

## 2019-01-20 DIAGNOSIS — R208 Other disturbances of skin sensation: Secondary | ICD-10-CM

## 2019-01-20 DIAGNOSIS — R209 Unspecified disturbances of skin sensation: Secondary | ICD-10-CM | POA: Insufficient documentation

## 2019-01-20 NOTE — Therapy (Signed)
[facility] [address] [place], [place], [address] Phone: [phone]   Fax:  [phone]  Occupational Therapy Treatment  Patient Details  Name: [name] MRN: [id number] Date of Birth: [birth year] Referring Provider (OT): Dr. Werner Lean   Encounter Date: 01/20/2019  OT End of Session - 01/20/19 1218    Visit Number  10    Number of Visits  13   eval plus 12 visits   Date for OT Re-Evaluation  01/28/19    Authorization Type  Medicaid    Authorization Time Period  12 visits  through 01/27/2019    Authorization - Visit Number  10    Authorization - Number of Visits  12    OT Start Time  1101    OT Stop Time  1150    OT Time Calculation (min)  49 min    Activity Tolerance  Patient tolerated treatment well       Past Medical History:  Diagnosis Date  . Diabetes mellitus without complication ([place])   . History of cervical fracture     Past Surgical History:  Procedure Laterality Date  . CERVICAL SPINE SURGERY      There were no vitals filed for this visit.  Subjective Assessment - 01/20/19 1159    Subjective   We got a call about getting a new hospital bed - they said to call back if we didn't hear in 2 days. We haven't heard anything else.    Patient is accompanied by:  Family member;Interpreter   wife, interpreter by video   Pertinent History  1 burst fx with [place] transection s/p C4-T6 spinal fusion (6/10), incomplete para deficits and s/p distal radius fx. Pt was d/c home with family on 09/24/2018.  Pt's PMH is significant for: T2DM, recent LE DVT    Limitations  cleared for AA/ROM, P/ROM and strengthening to RUE, (pt was originally NWB, will plan to perform WB to tolerance as pt is now greater than 12 weeks post injury and cleared for strengthening.    Patient Stated Goals  get better    Currently in Pain?  No/denies                   OT Treatments/Exercises (OP) - 01/20/19 0001      ADLs    Functional Mobility  Addressed functional  mobility necessary for greater independence in ADL activities - focused on long sitting first to increase flexibility and then with alternate leaning on Lelbow while reaching feet with R hand then leaning on R elbow while reaching feet with L hand.  Pt able to move back and forth from side to side with close supervision and cues.  Also addressed moving from long sitting with hands in lap to propping on hands in long sitting back to foward long sitting- initially pt required mod a however with practice pt able tc complete with cues and close supervision. Pt demonstrating sufficient wrist extension in BUE' s for this activity and denies any wrist pain. Pt positioned in supine on wedge to practice moving from supine onto L elbow then being able to prop sideways on L elbow with RUE steading on mat - initially max a however progressed to min a; pt needed max to mod a to prop sideways on L elbow due to decreased shoulder hyperextension in his RUE.  Transitioned back into long sitting and, using belts as leg lifters as well as sheets to slide LE's on, practiced having pt  move from long sitting to sitting EOM - pt needed max cues and max a to complete. Sliding board transfer from mat to wheelchair (level surface with mod a x1, stand by of 1 due to fatigue.      ADL Comments  Pt and wife report they received a phone call regarding the bed - there are now 3 companies involved in delivering a new bed per wife. Pt and wife were told if they didn't hear anything in 2 days they should contact the company - pt has not heard anything but has not contacted the company.  Encouraged wife and pt to call today and to keep calling until new bed is delivered. Pt and wife verbalized understanding.                 OT Short Term Goals - 01/20/19 1211      OT SHORT TERM GOAL #1   Title  I with inital HEP- 12/14/2018    Baseline  dependent    Time  6    Period  Weeks    Status   Achieved    Target Date  12/14/18      OT SHORT TERM GOAL #2   Title  Pt will donn shirt with min A    Baseline  mod A    Time  6    Period  Weeks    Status  Achieved   in wheelchair     OT SHORT TERM GOAL #3   Title  Pt will demonstrate bilateral wrist flexion/ extension WFLs for ADLs/ IADLS.    Baseline  45/45 wrist flexion/ extension bilateral UE's    Time  6    Period  Weeks    Status  Achieved   12/16/2018 LUE =58* and RUE = 60* however pt still with pain end ROM for R wrist     OT SHORT TERM GOAL #4   Title  Pt will perform LB dressing with mod A    Baseline  dependent    Time  6    Period  Weeks    Status  Deferred   deferred for now as pt is awaiting new hospital bed     OT SHORT TERM GOAL #5   Title  Pt will perform UB bathing with Min A, and LB bathing with mod A    Baseline  UB mod A, LB total A in bed    Time  6    Period  Weeks    Status  Achieved   12/16/2018 in long sitting in clinic; pt's hospital bed is poor and does not allow pt to complete at home. Attempting to contact [employer] to determine if more suitable hospital bed can be issued. WIfe to call clinic with rep name     OT SHORT TERM GOAL #6   Title  Pt will demonstrate ability to retireve a lightweight object at 110 shoulder flexion for RUE for increased ease with ADLs.    Baseline  95* shoulder flexion    Time  6    Period  Weeks    Status  Achieved      OT SHORT TERM GOAL #7   Title  Pt will demonstrate ability to retrieve a lightweight object at 115* shoulder flexion  with LUE for increased ease with ADLs,    Baseline  105*    Time  6    Period  Weeks    Status  Achieved  OT Long Term Goals - 01/20/19 1211      OT LONG TERM GOAL #1   Title  Pt will donn shirt with setup-01/28/2019    Baseline  mod A    Time  12    Period  Weeks    Status  On-going      OT LONG TERM GOAL #2   Title  Pt will perform LB dressing with min A    Baseline  dependent/ total A    Time  12     Period  Weeks    Status  Deferred   deferred for now as pt is awaitng new hospital bed     OT LONG TERM GOAL #3   Title  [facility] using sliding board    Baseline  total A +2 for basic transfers    Time  12    Period  Weeks    Status  On-going      OT LONG TERM GOAL #4   Title  Pt and family will verbalize understanding regarding recommendations for shower chair, DME    Baseline  dependent, pt does not yet have shower chair, currently bathing in bed    Time  12    Period  Weeks    Status  Achieved      OT LONG TERM GOAL #5   Title  Pt will increase bilateral grip strength by 10 lbs for increased ease with ADLs/ IADLS.    Baseline  RUE grip 5 lbs, LUE 3 lbs    Time  12    Period  Weeks    Status  Achieved   01/04/2019 R = 26 pounds, L = 3 pounds fro combined bilateral grip strength of 29 pounds (baseline combined 8 pounds)           Plan - 01/20/19 1215    Clinical Impression Statement  Pt continues to progress toward OT goals and is very motivated for therapy and to achieve as much independence as possible.  Pt has missed 3 missed OT appts - one due to medical reasons, one due to conflicting dr appt and one because Medicaid transportation forgot to pick pt up.  Pt has 12 OT visits approved through 01/27/19  Will request extension of end date to 02/11/19 in order to allow pt to be able to attend all approved visits in 2020 given that missed appts were beyond pt's control.    OT Occupational Profile and History  Detailed Assessment- Review of Records and additional review of physical, cognitive, psychosocial history related to current functional performance    Occupational performance deficits (Please refer to evaluation for details):  ADL's;IADL's;Work;Leisure;Social Participation    Body Structure / Function / Physical Skills  ADL;UE functional use;Endurance;Balance;Flexibility;Pain;FMC;ROM;GMC;Coordination;Sensation;Decreased knowledge of  precautions;Decreased knowledge of use of DME;IADL;Strength;Dexterity;Mobility;Tone    Clinical Decision Making  Several treatment options, min-mod task modification necessary    Comorbidities Affecting Occupational Performance:  May have comorbidities impacting occupational performance    Modification or Assistance to Complete Evaluation   No modification of tasks or assist necessary to complete eval    OT Frequency  1x / week    OT Duration  12 weeks    OT Treatment/Interventions  Self-care/ADL training;Ultrasound;Energy conservation;Aquatic Therapy;DME and/or AE instruction;Patient/family education;Paraffin;Gait Training;Passive range of motion;Balance training;Fluidtherapy;Cryotherapy;Splinting;Building services engineerunctional Mobility Training;Contrast Bath;Electrical Stimulation;Moist Heat;Therapeutic exercise;Manual Therapy;Therapeutic activities;Cognitive remediation/compensation;Neuromuscular education    Plan  give info on Independent living, functional reaching into cabinets high and low, UE  strengthening as able, shoulder and scapular ROM, long sit  for ADLs, incorporate functional use of UE's at home, functional mobility    Consulted and Agree with Plan of Care  Patient;Family member/caregiver    Family Member Consulted  wife,interpreter via video       Patient will benefit from skilled therapeutic intervention in order to improve the following deficits and impairments:   Body Structure / Function / Physical Skills: ADL, UE functional use, Endurance, Balance, Flexibility, Pain, FMC, ROM, GMC, Coordination, Sensation, Decreased knowledge of precautions, Decreased knowledge of use of DME, IADL, Strength, Dexterity, Mobility, Tone       Visit Diagnosis: Other symptoms and signs involving the nervous system  Other disturbances of skin sensation  Muscle weakness (generalized)  Abnormal posture  Stiffness of right shoulder, not elsewhere classified  Stiffness of left shoulder, not elsewhere  classified  Stiffness of right wrist, not elsewhere classified  Stiffness of left wrist, not elsewhere classified    Problem List Patient Active Problem List   Diagnosis Date Noted  . 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 Quadriplegia ([facility]) 12/30/2018    Norton Pastel, OTR/L 01/20/2019, 12:21 PM  [facility] [address] [place], [place], [address] Phone: [phone]   Fax:  [phone]  Name: [name] MRN: [id number] Date of Birth: 06/25/1965

## 2019-01-20 NOTE — Therapy (Signed)
[facility] [address] [place], [place], [address] Phone: [phone]   Fax:  [phone]  Physical Therapy Treatment  Patient Details  Name: Kali Ambler MRN: [id number] Date of Birth: 1965/05/12 Referring Provider (PT): Doran Clay, MD - referring physician.  Patient to have care transferred to [facility].  New PCP: Kerin Perna, NP   Encounter Date: 01/20/2019  PT End of Session - 01/20/19 1009    Visit Number  12    Number of Visits  16   eval + initial 3 visits from Medicaid   Date for PT Re-Evaluation  02/09/19    Authorization Type  Medicaid    Authorization Time Period  11/18/18-02/09/19; 12 visits    Authorization - Visit Number  8    Authorization - Number of Visits  12    PT Start Time  1011    PT Stop Time  1102    PT Time Calculation (min)  51 min    Equipment Utilized During Treatment  --   interpreter services utilized   Activity Tolerance  Patient tolerated treatment well    Behavior During Therapy  WFL for tasks assessed/performed       Past Medical History:  Diagnosis Date  . Diabetes mellitus without complication ([place])   . History of cervical fracture     Past Surgical History:  Procedure Laterality Date  . CERVICAL SPINE SURGERY      There were no vitals filed for this visit.  Subjective Assessment - 01/20/19 1010    Subjective  Pt reports that he has been doing well. Pt reports that transportation did not come last week so someone else had to bring him today. His son helped him to use slideboard in and out of car to get here today. His 2 sons helped.    Patient is accompained by:  Family member    Pertinent History  C1 burst fx with [place] transection s/p C4-T6 spinal fusion (6/10), incomplete quadriplegia, previous episodes of autonomic dysreflexia in inpatient rehab    Patient Stated Goals  wants to do exercises as soon as he can, wants to be able to feel his legs again if  he can.    Currently in Pain?  No/denies                       [facility] Adult PT Treatment/Exercise - 01/20/19 0001      Bed Mobility   Bed Mobility  Rolling Left;Sit to Supine    Rolling Left  Moderate Assistance - Patient 50-74%    Sit to Supine  Total Assistance - Patient < 25%;2 Helpers      Transfers   Transfers  Lateral/Scoot Transfers    Lateral/Scoot Transfers  2: Max assist    Lateral Transfers: Patient Percentage  20%    Lateral/Scoot Transfer Details (indicate cue type and reason)  from R>L w/c to mat with slideboard. Verbal cues to push through arms to help scoot. Multiple bouts to make across.    Comments  Supine to long sit pulling on therapists arm to assist coming up max assist.      Neuro Re-ed    Neuro Re-ed Details   Pt performed short sitting edge of mat with feet on 4" step for sitting balance x 30 min: Sitting with hands in lap x 30 sec, sitting with reaching for targets x 5 each UE, sitting with alternating shoulder flexion x 10, sitting holding yoga  block and raising to shoulder height x 5 using breathing to help with stability. Sitting with arms positioned behind patient trying to use to prop up leaning back slightly. Performed 30 sec x 2. Sitting with hands on yoga blocks with shoulder depression x 10. Sitting coming down on bolster on to forearm and then pushing back up to sit x 10 each side. Coming down on bolster then reaching across with opposite arm for cone and handing back to therapist once pushes back up x 5. Close SBA/CGA for safety with all activities. In prone on mat propped up on forearm x 2 min then with walking arm forward and back x 5 each side CGA for safety. Sidelying left maintaining position with stabilizing reversals x 30 sec at shoulder. Long sitting mod assist to maintain position initially with decreased tightnes in hamstrings. Instructed patient to reach forward to help stretch. Performed 30 sec x 3. Long sit with bringing arms back to  try to prop up 20 sec x 2 min assist to CGA to maintain.             PT Education - 01/20/19 1427    Education Details  Pt to continue with current HEP    Person(s) Educated  Patient    Methods  Explanation    Comprehension  Verbalized understanding       PT Short Term Goals - 12/23/18 1411      PT SHORT TERM GOAL #1   Title  Patient will direct initial HEP independently to wife/caregivers for stretching LEs in order to prevent risk of contractures. ALL STGS DUE BY 3RD VISIT - 11/05/18    Baseline  pt and wife have been instructed in initial HEP for LE stretching.    Time  3    Period  --   visits   Status  Achieved      PT SHORT TERM GOAL #2   Title  Patient will perform sitting balance with A/P weight shifting with mod A in order to prepare for sliding board lateral transfers from mat <> w/c.    Baseline  Long sit with trunk flexion CGA for safety, short sit edge of mat with reaching for targets close supervision to min assist for safety.    Status  Achieved      PT SHORT TERM GOAL #3   Title  Patient will perform rolling right and left in bed with mod A in order to decrease caregiver burden and for pressure relief.    Baseline  max assist with rolling    Status  Partially Met      PT SHORT TERM GOAL #4   Title  Patient will perform scoot pivot t/f on sliding board from w/c <> mat table (from one surface elevated with gravity assisting) with max A.    Baseline  max assist but 2nd person for safety still.    Status  Partially Met        PT Long Term Goals - 12/01/18 1736      PT LONG TERM GOAL #1   Title  Patient will direct final HEP independently to wife/caregivers for stretching LEs in order to prevent risk of contractures. ALL LTGS DUE BY 12TH VISIT OR 02/09/2019    Baseline  dependent    Time  12    Period  --   visits   Status  Revised      PT LONG TERM GOAL #2   Title  When pt receives his  own w/c (vs. loaner chair) pt will demonstrate independence with  w/c parts in order to safely prepare for transfers.    Baseline  not yet assessed    Time  12    Period  Weeks    Status  Revised      PT LONG TERM GOAL #3   Title  Patient will perform sitting balance with A/P and M/L weight shifting with supervision/min A in order to perform sliding board lateral transfers from mat <> w/c.    Baseline  currently max A for dynamic sitting balance    Time  12    Period  Weeks    Status  Revised      PT LONG TERM GOAL #4   Title  Patient will perform rolling right and left in bed with min A in order to decrease caregiver burden and for pressure relief.    Baseline  Total A    Time  12    Period  Weeks    Status  Revised      PT LONG TERM GOAL #5   Title  Patient will perform scoot pivot t/f on sliding board from w/c <> mat table on level surface with min-mod A.    Baseline  total A x2 with gravity assisting transfer    Time  12    Period  Weeks    Status  Revised            Plan - 01/20/19 1427    Clinical Impression Statement  Pt showing improving sitting balance keeping weight forward even with leaning down on side. Able to prop with arms behing with less assist today. Pt did have increased hamstring tightness with initial long sit today,    PT Treatment/Interventions  ADLs/Self Care Home Management;DME Instruction;Functional mobility training;Therapeutic activities;Therapeutic exercise;Balance training;Neuromuscular re-education;Wheelchair mobility training;Patient/family education;Passive range of motion;Energy conservation;Electrical Stimulation;Orthotic Fit/Training;Manual techniques    PT Next Visit Plan  Schedule more visits in January. Slideboard transfers with patient doing more.  Check HEP- how are stretches going with wife?  rolling with UE momentum; pushing up to sitting - long sitting, circle sitting. Short Sitting balance A/P and M/L, range of motion hips and shoulders, UE strengthening.  Management of LE during transfers and bed  mobility    Consulted and Agree with Plan of Care  Patient;Family member/caregiver       Patient will benefit from skilled therapeutic intervention in order to improve the following deficits and impairments:  Decreased activity tolerance, Decreased balance, Decreased mobility, Decreased endurance, Decreased range of motion, Decreased strength, Impaired flexibility, Impaired sensation, Impaired UE functional use, Pain, Impaired tone, Postural dysfunction  Visit Diagnosis: Muscle weakness (generalized)     Problem List Patient Active Problem List   Diagnosis Date Noted  . Spinal cord injury at T1-T6 level ([place]) 12/30/2018  . Quadriplegia ([place]) 12/30/2018    Electa Sniff, PT, DPT, NCS 01/20/2019, 2:30 PM  [facility] [address] [place], [place], [address] Phone: [phone]   Fax:  [phone]  Name: Emett Stapel MRN: [id number] Date of Birth: July 29, 1965

## 2019-01-22 ENCOUNTER — Encounter: Payer: Self-pay | Admitting: Physical Medicine and Rehabilitation

## 2019-01-22 ENCOUNTER — Other Ambulatory Visit: Payer: Self-pay

## 2019-01-22 ENCOUNTER — Encounter
Payer: Medicaid Other | Attending: Physical Medicine and Rehabilitation | Admitting: Physical Medicine and Rehabilitation

## 2019-01-22 VITALS — BP 93/67 | HR 60 | Temp 97.9°F | Ht 70.0 in

## 2019-01-22 DIAGNOSIS — M7918 Myalgia, other site: Secondary | ICD-10-CM | POA: Insufficient documentation

## 2019-01-22 DIAGNOSIS — S24101A Unspecified injury at T1 level of thoracic spinal cord, initial encounter: Secondary | ICD-10-CM | POA: Diagnosis present

## 2019-01-22 DIAGNOSIS — K592 Neurogenic bowel, not elsewhere classified: Secondary | ICD-10-CM | POA: Insufficient documentation

## 2019-01-22 DIAGNOSIS — N319 Neuromuscular dysfunction of bladder, unspecified: Secondary | ICD-10-CM | POA: Insufficient documentation

## 2019-01-22 DIAGNOSIS — Z95828 Presence of other vascular implants and grafts: Secondary | ICD-10-CM

## 2019-01-22 DIAGNOSIS — G8250 Quadriplegia, unspecified: Secondary | ICD-10-CM | POA: Diagnosis present

## 2019-01-22 MED ORDER — BACLOFEN 5 MG PO TABS: 5.0000 mg | ORAL_TABLET | Freq: Three times a day (TID) | ORAL | 5 refills | Status: DC

## 2019-01-22 MED ORDER — BACLOFEN 5 MG PO TABS: 5.0000 mg | ORAL_TABLET | Freq: Three times a day (TID) | ORAL | 1 refills | Status: DC

## 2019-01-22 NOTE — Patient Instructions (Addendum)
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    1. Spasticity- educated on 1-2 years past injury, can get worse for that period of time.  Baclofen 5 mg 3x/day x 1 weeks then 10 mg 3x/day, then will go up to 15 mg 3x/day after another 1 week- for muscle tightness/spasticity.   2. DVTs- B/L LEs- Has IVC filter- time to remove, but sounds like fallen through cracks- needs to get removed by IR- sounds like has to be done here since cannot get to [facility]- will put in an IR referral to interventional radiology for IVC filter to be removed.  3. Needs trigger point injections of shoulders/neck/pecs B/L - wil get in for this ASAP- oxycodone not truly treating pain, ned injections. Weights- would wait until injections until use weight.s  4. Lidoderm patches- will try and order AFTER injections.   5. Theracane- suggest ordering online- apply/put pressure for 2-4 minutes on EACH tight muscle- I ordered mine from [employer].  $30 about- youtube also has videos to help use it.    6. F/U in 1 week for trigger point injections.  Then will f/u every 2-3 months for first year.   7. Con't Gabapentin for nerve pain

## 2019-01-22 NOTE — Progress Notes (Signed)
Subjective:    Patient ID: [name], male    DOB: 1965/11/27, 53 y.o.   MRN: [id number]  HPI  Wife is Byrd Hesselbach CC: SCI/quadriplegia  Patient is a 53 yr old  R handed male with hx of DM? Stopped taking meds for DM - was dx'd in hospital, but then told by PCP doesn't need it anymore.   Otherwise, has quadriplegia-  Was treated at [facility] med in [place] for SCI  [place]- August 21 2018- ~ 12 feet-  Fractured B/L side ribs and R wrist/hand. 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  Bladder- in and out caths- q4 hours. Bowel- does dig stim and suppository every night and Senokot 2 tabs every AM.  Doing OK currently.   Neck and R shoulder pain- both have hurt since his surgery. It's bothering him when he's going outside or goes to therapy-  Was fused in neck from C4- T6 Says it's So-so- then says 7/10 pain levels. Has oxycodone- 1x/day if doesn't take tylenol but also takes tylenol- maybe 1x/day when doesn't take oxycodone.    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      Pain Inventory Average Pain 7 Pain Right Now 67f My pain is sharp  In the last 24 hours, has pain interfered with the following? General activity 8 Relation with others 7 Enjoyment of life 7 What TIME of day  is your pain at its worst? night Sleep (in general) Fair  Pain is worse with: some activites Pain improves with: therapy/exercise Relief from Meds: .  Mobility do you drive?  no use a wheelchair needs help with transfers  Function not employed: date last employed . I need assistance with the following:  dressing, bathing, toileting, meal prep and household duties  Neuro/Psych bladder control problems bowel control problems weakness numbness tremor tingling trouble walking depression anxiety  Prior Studies Any changes since last visit?  no  Physicians involved in your care Any changes since last visit?  no   Family History  Problem Relation Age of Onset  . Diabetes Mother   . Diabetes Father    Social History   Socioeconomic History  . Marital status: Married    Spouse name: Not on file  . Number of children: 4  . Years of education: 6th grade  . Highest education level: Not on file  Occupational History  . Occupation: Disabled  Tobacco Use  . Smoking status: Never Smoker  . Smokeless tobacco: Never Used  Substance and Sexual Activity  . Alcohol use: Yes    Comment: social  . Drug use: No  . Sexual activity: Not on file  Other Topics Concern  . Not on file  Social History Narrative   Lives at  home with his family.   Right-handed.   No daily use of caffeine.   Social Determinants of Health   Financial Resource Strain:   . Difficulty of Paying Living Expenses: Not on file  Food Insecurity:   . Worried About Programme researcher, broadcasting/film/videounning Out of Food in the Last Year: Not on file  . Ran Out of Food in the Last Year: Not on file  Transportation Needs:   . Lack of Transportation (Medical): Not on file  . Lack of Transportation (Non-Medical): Not on file  Physical Activity:   . Days of Exercise per Week: Not on file  . Minutes of Exercise per Session: Not on file  Stress:   . Feeling of Stress : Not on file  Social Connections:   . Frequency of Communication with  Friends and Family: Not on file  . Frequency of Social Gatherings with Friends and Family: Not on file  . Attends Religious Services: Not on file  . Active Member of Clubs or Organizations: Not on file  . Attends BankerClub or Organization Meetings: Not on file  . Marital Status: Not on file   Past Surgical History:  Procedure Laterality Date  . CERVICAL SPINE SURGERY     Past Medical History:  Diagnosis Date  . Diabetes mellitus without complication (H[facility])   . History of cervical fracture    BP 93/67   Pulse 60   Temp 97.9 F (36.6 C)   Ht 5\' 10"  (1.778 m)   SpO2 96%   Opioid Risk Score:   Fall Risk Score:  `1  Depression screen PHQ 2/9  [facility]26[facility] 10/13/2018  Decreased Interest 1 1  Down, Depressed, Hopeless 1 1  PHQ - 2 Score 2 2  Altered sleeping 1 2  Tired, decreased energy 1 2  Change in appetite 1 0  Feeling bad or failure about yourself  1 1  Trouble concentrating 1 0  Moving slowly or fidgety/restless 0 0  Suicidal thoughts 1 0  PHQ-9 Score 8 7     Review of Systems  Constitutional: Negative.   HENT: Negative.   Eyes: Negative.   Respiratory: Negative.   Cardiovascular: Negative.   Gastrointestinal: Negative.   Endocrine: Negative.   Genitourinary: Positive for difficulty urinating.  Musculoskeletal: Positive for arthralgias and neck pain.  Skin: Negative.   Allergic/Immunologic: Negative.   Neurological: Positive for tremors, weakness and numbness.  Hematological: Negative.   Psychiatric/Behavioral: Positive for dysphoric mood. The patient is nervous/anxious.   All other systems reviewed and are negative.      Objective:   Physical Exam Awake, alert, appropriate, accompanied by wife and interpretor- ;  In power w/c, NAD MS:  R L C5  5/5 5/5 C6  5/5 5/5 C7 5/5 5/5 C8  3/5 2/5 T1 2/5 2/5 No movement in LEs of any kind-  Except muscle spasms Doesn't have biceps tendinitis on Exam on R Shoulder pain is actually R  pec/upper trap pain Has trigger points in R>L pecs, >L upper traps, levators, and scalenes- very tight  Neuro: Sensation intact to LT to C8 However decreased at T1 B/L and absent at T2 B/L and to S5 B/L MAS of LEs 1+ in LEs- at hips and knees B/L MAS of 1 in Ankles B/L  Skin- 2-3+ edema of LEs- to mid calves B/L (hx of DVTs)       Assessment & Plan:   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    1. Spasticity-educated on 1-2 years past injury, can get worse for that period of time.  Baclofen 5 mg 3x/day x 1 weeks then 10 mg 3x/day, then will go up to 15 mg 3x/day after another 1 week- for muscle tightness/spasticity.    2. DVTs- B/L LEs- Has IVC filter- time to remove, but sounds like fallen through cracks- needs to get removed by IR- sounds like has to be done here since cannot get to [facility]- will put in an IR referral to interventional radiology for IVC filter to be removed.  3. Needs trigger point injections of shoulders/neck/pecs B/L - wil get in for this ASAP- oxycodone not truly treating pain, ned injections. Weights- would wait until injections until use weight.s  4. Lidoderm patches- will try and order AFTER injections.   5. Theracane- suggest ordering online- apply/put pressure for 2-4 minutes on EACH tight muscle- I ordered mine from [employer].  $30 about- youtube also has videos to help use it.    6. F/U in 1 week for trigger point injections.   7. Con't Gabapentin for nerve pain  I spent a total of 1 hour on appointment- more than 35 minutes educating pt (spanish)

## 2019-01-25 ENCOUNTER — Other Ambulatory Visit (HOSPITAL_COMMUNITY): Payer: Self-pay | Admitting: Interventional Radiology

## 2019-01-25 ENCOUNTER — Telehealth (INDEPENDENT_AMBULATORY_CARE_PROVIDER_SITE_OTHER): Payer: Self-pay

## 2019-01-25 ENCOUNTER — Telehealth: Payer: Self-pay | Admitting: *Deleted

## 2019-01-25 NOTE — Telephone Encounter (Signed)
Patient called to make a medication refill for   apixaban Arne [place]) 5 MG TABS tablet   Patient uses [facility] [place], [place] - [address]  [address], [place] [address]    Please advice  [phone]

## 2019-01-25 NOTE — Telephone Encounter (Signed)
FWD to PCP

## 2019-01-25 NOTE — Telephone Encounter (Signed)
[name] from [facility] called for a clarification on which baclofen order they are to fill. I clarified it is the disp #150, take one po tid, then increase to 2 tablets tid after one week, and if needed may go to 3 tablets tid after another week if needed.

## 2019-01-26 ENCOUNTER — Other Ambulatory Visit (INDEPENDENT_AMBULATORY_CARE_PROVIDER_SITE_OTHER): Payer: Self-pay | Admitting: Primary Care

## 2019-01-26 MED ORDER — APIXABAN 5 MG PO TABS: 5.0000 mg | ORAL_TABLET | Freq: Two times a day (BID) | ORAL | 5 refills | Status: DC

## 2019-01-26 NOTE — Telephone Encounter (Signed)
Please tell patient blood thinner has been sent in

## 2019-01-27 ENCOUNTER — Encounter: Payer: Self-pay | Admitting: Physical Therapy

## 2019-01-27 ENCOUNTER — Ambulatory Visit: Payer: Medicaid Other | Admitting: Physical Therapy

## 2019-01-27 ENCOUNTER — Other Ambulatory Visit: Payer: Self-pay

## 2019-01-27 ENCOUNTER — Ambulatory Visit: Payer: Medicaid Other | Admitting: Occupational Therapy

## 2019-01-27 DIAGNOSIS — R293 Abnormal posture: Secondary | ICD-10-CM

## 2019-01-27 DIAGNOSIS — R29818 Other symptoms and signs involving the nervous system: Secondary | ICD-10-CM

## 2019-01-27 DIAGNOSIS — M542 Cervicalgia: Secondary | ICD-10-CM

## 2019-01-27 DIAGNOSIS — M25611 Stiffness of right shoulder, not elsewhere classified: Secondary | ICD-10-CM

## 2019-01-27 DIAGNOSIS — G8252 Quadriplegia, C1-C4 incomplete: Secondary | ICD-10-CM

## 2019-01-27 DIAGNOSIS — R208 Other disturbances of skin sensation: Secondary | ICD-10-CM

## 2019-01-27 DIAGNOSIS — M6281 Muscle weakness (generalized): Secondary | ICD-10-CM

## 2019-01-27 DIAGNOSIS — M25612 Stiffness of left shoulder, not elsewhere classified: Secondary | ICD-10-CM

## 2019-01-27 NOTE — Therapy (Signed)
[facility] [address] [place], [place], [address] Phone: [phone]   Fax:  [phone]  Physical Therapy Treatment  Patient Details  Name: [name] MRN: [id number] Date of Birth: Sep 12, 1965 Referring Provider (PT): Doran Clay, MD - referring physician.  Patient to have care transferred to [facility].  New PCP: Kerin Perna, NP   Encounter Date: 01/27/2019  PT End of Session - 01/27/19 1031    Visit Number  13    Number of Visits  16   eval + initial 3 visits from Medicaid   Date for PT Re-Evaluation  02/09/19    Authorization Type  Medicaid    Authorization Time Period  11/18/18-02/09/19; 12 visits    Authorization - Visit Number  8    Authorization - Number of Visits  12    PT Start Time  9211    PT Stop Time  1100    PT Time Calculation (min)  45 min    Equipment Utilized During Treatment  --   interpreter services utilized   Activity Tolerance  Patient tolerated treatment well    Behavior During Therapy  [facility] for tasks assessed/performed       Past Medical History:  Diagnosis Date  . Diabetes mellitus without complication ([place])   . History of cervical fracture     Past Surgical History:  Procedure Laterality Date  . CERVICAL SPINE SURGERY      There were no vitals filed for this visit.  Subjective Assessment - 01/27/19 1023    Subjective  Patient entered clinic with foam wrap around legs while in w/c and pt repored it's to help keep legs from abducting while seated in w/c. When he's trying to move while seated, his feet sometimes move and he's unable to control them so the wrap helps with that. He's using a sliding board to get into front seat of car occasionally. Someone came to the house to assess his bed and pt stated the individual who evaluated the bed said the bed was fine. PT asked if the individual left contact information to continue conversation about concerns with pt's  current bed.    Patient is accompained by:  Family member    Pertinent History  C1 burst fx with [place] transection s/p C4-T6 spinal fusion (6/10), incomplete quadriplegia, previous episodes of autonomic dysreflexia in inpatient rehab    Patient Stated Goals  wants to do exercises as soon as he can, wants to be able to feel his legs again if he can.    Currently in Pain?  No/denies                       [facility] Adult PT Treatment/Exercise - 01/27/19 1034      Transfers   Transfers  Lateral/Scoot Transfers    Lateral/Scoot Transfers  2: Max assist    Lateral Transfers: Patient Percentage  40%    Lateral/Scoot Transfer Details (indicate cue type and reason)  --    Sit to Supine Details   From R>L w/c> mat with therapist assist for placement of sliding board. Multiple attempts to transfer from w/c>mat needed. Therapist assisted anterior trunk lean and weight shift initiation at the hips. Pt was able to push through UE to use minimal trunk rotation and momentum to advance towards mat. Therapist helped unweight at the hip to allow for removal of sliding board once transfer was complete.     Comments  Sitting edge of  mat > long sitting L on mat. Therapist assist for LE and trunk rotation. SPT was SBA behind pt in case of  instability leading to posterior weight displacement.       Dynamic Sitting Balance   Dynamic Sitting - Balance Support  Right upper extremity supported;Left upper extremity supported;Feet supported   Long sitting   Dynamic Sitting - Level of Assistance  4: Min assist;3: Mod assist    Dynamic Sitting Balance - Compensations  Therapist sat behind pt on physioball to provide support and gradually dec support in order to facilitate core activation and stabilization.     Reach (Patient is able to reach ___ inches to right, left, forward, back)  Pt able to sustain elevated UE to shoulder height and then turn head to R > midline > L > midline. x8 reps total    Dynamic Sitting -  Balance Activities  Lateral lean/weight shifting;Forward lean/weight shifting;Reaching for weighted objects;Reaching across midline   1# weighted ball   Sitting balance - Comments  Focused on maintaining core stability in long sitting position with addition of reaching for 1# weighted ball from SPT on the L, bringing the ball to midline, then to the R and back to SPT. PT was behind pt to provide some stability and assist truncal and cervical rotation. X8 reps, repeated on the R side.       Therapeutic Activites    Therapeutic Activities  Other Therapeutic Activities    Other Therapeutic Activities  Weighed pt w/c and pt in w/c to determined pt's weight in order to justify need for new bed at home. Via intrepreter, PT discussed with pt and wife updates regarding the bed and PT was informed that individual evaluating bed situation at home did not warrant adjustment. PT discussed assessing wt of pt to further justify need at home for new bed and will be contacting company to further justify and express concerns with current situation.         Weights: patient in wheelchair: 635.6 lb   Wheelchair only: 393.20 lb Weight of patient: 242.40 lb       PT Short Term Goals - 12/23/18 1411      PT SHORT TERM GOAL #1   Title  Patient will direct initial HEP independently to wife/caregivers for stretching LEs in order to prevent risk of contractures. ALL STGS DUE BY 3RD VISIT - 11/05/18    Baseline  pt and wife have been instructed in initial HEP for LE stretching.    Time  3    Period  --   visits   Status  Achieved      PT SHORT TERM GOAL #2   Title  Patient will perform sitting balance with A/P weight shifting with mod A in order to prepare for sliding board lateral transfers from mat <> w/c.    Baseline  Long sit with trunk flexion CGA for safety, short sit edge of mat with reaching for targets close supervision to min assist for safety.    Status  Achieved      PT SHORT TERM GOAL #3   Title   Patient will perform rolling right and left in bed with mod A in order to decrease caregiver burden and for pressure relief.    Baseline  max assist with rolling    Status  Partially Met      PT SHORT TERM GOAL #4   Title  Patient will perform scoot pivot t/f on sliding board from w/c <>  mat table (from one surface elevated with gravity assisting) with max A.    Baseline  max assist but 2nd person for safety still.    Status  Partially Met        PT Long Term Goals - 12/01/18 1736      PT LONG TERM GOAL #1   Title  Patient will direct final HEP independently to wife/caregivers for stretching LEs in order to prevent risk of contractures. ALL LTGS DUE BY 12TH VISIT OR 02/09/2019    Baseline  dependent    Time  12    Period  --   visits   Status  Revised      PT LONG TERM GOAL #2   Title  When pt receives his own w/c (vs. loaner chair) pt will demonstrate independence with w/c parts in order to safely prepare for transfers.    Baseline  not yet assessed    Time  12    Period  Weeks    Status  Revised      PT LONG TERM GOAL #3   Title  Patient will perform sitting balance with A/P and M/L weight shifting with supervision/min A in order to perform sliding board lateral transfers from mat <> w/c.    Baseline  currently max A for dynamic sitting balance    Time  12    Period  Weeks    Status  Revised      PT LONG TERM GOAL #4   Title  Patient will perform rolling right and left in bed with min A in order to decrease caregiver burden and for pressure relief.    Baseline  Total A    Time  12    Period  Weeks    Status  Revised      PT LONG TERM GOAL #5   Title  Patient will perform scoot pivot t/f on sliding board from w/c <> mat table on level surface with min-mod A.    Baseline  total A x2 with gravity assisting transfer    Time  12    Period  Weeks    Status  Revised            Plan - 01/27/19 1031    Clinical Impression Statement  Today's session focused on  dynamic UE movements in long sitting to increase challenge for pt to maintain core stabilization. Pt demonstrated improved sitting balance to allow pt to lift and move weighted ball across midline and outside of BOS. Required less assistance to maintain sitting balance, however, required min-modA for truncal rotation demonstrating greater restriction to the L. PT to contact company regarding their decision about the hospital bed and will attempt to explain why his current bed is limiting him functionall. Pt will continue to benefit from skilled therapy services to address deficits.    PT Treatment/Interventions  ADLs/Self Care Home Management;DME Instruction;Functional mobility training;Therapeutic activities;Therapeutic exercise;Balance training;Neuromuscular re-education;Wheelchair mobility training;Patient/family education;Passive range of motion;Energy conservation;Electrical Stimulation;Orthotic Fit/Training;Manual techniques    PT Next Visit Plan  Schedule more visits in January. Begin assessing LTGs and request more visits from medicaid. Updates on the bed - does he have a regular bed he could transfer on to just to work on long sitting, rolling, etc as part of HEP? Slideboard transfers with patient doing more.  Check HEP- how are stretches going with wife?  rolling with UE momentum; pushing up to sitting - long sitting, circle sitting wiht dynamic reaching. Short Sitting balance A/P  and M/L, range of motion hips and shoulders, UE strengthening.  Management of LE during transfers and bed mobility.  When we get a new standing frame - standing.    Consulted and Agree with Plan of Care  Patient;Family member/caregiver       Patient will benefit from skilled therapeutic intervention in order to improve the following deficits and impairments:  Decreased activity tolerance, Decreased balance, Decreased mobility, Decreased endurance, Decreased range of motion, Decreased strength, Impaired flexibility,  Impaired sensation, Impaired UE functional use, Pain, Impaired tone, Postural dysfunction  Visit Diagnosis: Other symptoms and signs involving the nervous system  Other disturbances of skin sensation  Muscle weakness (generalized)  Abnormal posture  Cervicalgia  Quadriplegia, C1-C4 incomplete ([place])     Problem List Patient Active Problem List   Diagnosis Date Noted  . 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 Neurogenic bowel 01/22/2019  . Neurogenic bladder 01/22/2019  . Myofascial pain 01/22/2019  . Spinal cord injury at T1-T6 level ([place]) 12/30/2018  . Quadriplegia ([place]) 12/30/2018    Juliann Pulse SPT  01/27/2019, 12:55 PM  Juliann Pulse SPT  01/27/2019, 12:55 PM  [facility] [address] [place], [place], [address] Phone: [phone]   Fax:  [phone]  Name: Nhia Heaphy MRN: [id number] Date of Birth: 1965/06/30

## 2019-01-27 NOTE — Therapy (Signed)
[facility] [address] [place], [place], [address] Phone: [phone]   Fax:  [phone]  Occupational Therapy Treatment  Patient Details  Name: [name] MRN: [id number] Date of Birth: [birth year] Referring Provider (OT): Dr. Werner Lean   Encounter Date: 01/27/2019  OT End of Session - 01/27/19 1743    Visit Number  11    Number of Visits  13   eval plus 12 visits   Date for OT Re-Evaluation  01/28/19    Authorization Type  Medicaid    Authorization Time Period  12 visits  through 01/27/2019, 2 visits approved through 02/10/2019    Authorization - Visit Number  11    Authorization - Number of Visits  12    OT Start Time  1102    OT Stop Time  1145    OT Time Calculation (min)  43 min    Activity Tolerance  Patient tolerated treatment well       Past Medical History:  Diagnosis Date  . Diabetes mellitus without complication ([place])   . History of cervical fracture     Past Surgical History:  Procedure Laterality Date  . CERVICAL SPINE SURGERY      There were no vitals filed for this visit.  Subjective Assessment - 01/27/19 1742    Patient is accompanied by:  Family member;Interpreter   wife, interpreter by video   Pertinent History  1 burst fx with [place] transection s/p C4-T6 spinal fusion (6/10), incomplete para deficits and s/p distal radius fx. Pt was d/c home with family on 09/24/2018.  Pt's PMH is significant for: T2DM, recent LE DVT    Limitations  cleared for AA/ROM, P/ROM and strengthening to RUE, (pt was originally NWB, will plan to perform WB to tolerance as pt is now greater than 12 weeks post injury and cleared for strengthening.    Patient Stated Goals  get better    Currently in Pain?  No/denies             Treatment: Long sitting to perform functional cross body reaching and trunk rotation with pt leaning down on left elbow initially to reach with RUE  then reaching down between his  legs with LUE to retrieve items  min-mod facilitation/ assist, in prep for ADLs.  Pt transferred with sliding board with mod A +1 and min A +1 for safety. Therapist reviewed pt's theraband exercises for shoulder flexion and horizontal abduction with green in reclined position and red seated 10-15 reps each, min v.c                 OT Short Term Goals - 01/20/19 1211      OT SHORT TERM GOAL #1   Title  I with inital HEP- 12/14/2018    Baseline  dependent    Time  6    Period  Weeks    Status  Achieved    Target Date  12/14/18      OT SHORT TERM GOAL #2   Title  Pt will donn shirt with min A    Baseline  mod A    Time  6    Period  Weeks    Status  Achieved   in wheelchair     OT SHORT TERM GOAL #3   Title  Pt will demonstrate bilateral wrist flexion/ extension WFLs for ADLs/ IADLS.    Baseline  45/45 wrist flexion/ extension bilateral UE's    Time  6  Period  Weeks    Status  Achieved   12/16/2018 LUE =58* and RUE = 60* however pt still with pain end ROM for R wrist     OT SHORT TERM GOAL #4   Title  Pt will perform LB dressing with mod A    Baseline  dependent    Time  6    Period  Weeks    Status  Deferred   deferred for now as pt is awaiting new hospital bed     OT SHORT TERM GOAL #5   Title  Pt will perform UB bathing with Min A, and LB bathing with mod A    Baseline  UB mod A, LB total A in bed    Time  6    Period  Weeks    Status  Achieved   12/16/2018 in long sitting in clinic; pt's hospital bed is poor and does not allow pt to complete at home. Attempting to contact [employer] to determine if more suitable hospital bed can be issued. WIfe to call clinic with rep name     OT SHORT TERM GOAL #6   Title  Pt will demonstrate ability to retireve a lightweight object at 110 shoulder flexion for RUE for increased ease with ADLs.    Baseline  95* shoulder flexion    Time  6    Period  Weeks    Status  Achieved      OT SHORT TERM GOAL #7   Title  Pt  will demonstrate ability to retrieve a lightweight object at 115* shoulder flexion  with LUE for increased ease with ADLs,    Baseline  105*    Time  6    Period  Weeks    Status  Achieved        OT Long Term Goals - 01/20/19 1211      OT LONG TERM GOAL #1   Title  Pt will donn shirt with setup-01/28/2019    Baseline  mod A    Time  12    Period  Weeks    Status  On-going      OT LONG TERM GOAL #2   Title  Pt will perform LB dressing with min A    Baseline  dependent/ total A    Time  12    Period  Weeks    Status  Deferred   deferred for now as pt is awaitng new hospital bed     OT LONG TERM GOAL #3   Title  Pt will transfer to [facility] with mod A using sliding board    Baseline  total A +2 for basic transfers    Time  12    Period  Weeks    Status  On-going      OT LONG TERM GOAL #4   Title  Pt and family will verbalize understanding regarding recommendations for shower chair, DME    Baseline  dependent, pt does not yet have shower chair, currently bathing in bed    Time  12    Period  Weeks    Status  Achieved      OT LONG TERM GOAL #5   Title  Pt will increase bilateral grip strength by 10 lbs for increased ease with ADLs/ IADLS.    Baseline  RUE grip 5 lbs, LUE 3 lbs    Time  12    Period  Weeks    Status  Achieved  01/04/2019 R = 26 pounds, L = 3 pounds fro combined bilateral grip strength of 29 pounds (baseline combined 8 pounds)           Plan - 01/27/19 1745    Clinical Impression Statement  Pt demonstrates excellent overall progress.HE demonstrates improved UE strength and transfers.    OT Occupational Profile and History  Detailed Assessment- Review of Records and additional review of physical, cognitive, psychosocial history related to current functional performance    Occupational performance deficits (Please refer to evaluation for details):  ADL's;IADL's;Work;Leisure;Social Participation    Body Structure / Function / Physical Skills  ADL;UE  functional use;Endurance;Balance;Flexibility;Pain;FMC;ROM;GMC;Coordination;Sensation;Decreased knowledge of precautions;Decreased knowledge of use of DME;IADL;Strength;Dexterity;Mobility;Tone    Clinical Decision Making  Several treatment options, min-mod task modification necessary    Comorbidities Affecting Occupational Performance:  May have comorbidities impacting occupational performance    Modification or Assistance to Complete Evaluation   No modification of tasks or assist necessary to complete eval    OT Frequency  1x / week    OT Duration  12 weeks    OT Treatment/Interventions  Self-care/ADL training;Ultrasound;Energy conservation;Aquatic Therapy;DME and/or AE instruction;Patient/family education;Paraffin;Gait Training;Passive range of motion;Balance training;Fluidtherapy;Cryotherapy;Splinting;Building services engineerunctional Mobility Training;Contrast Bath;Electrical Stimulation;Moist Heat;Therapeutic exercise;Manual Therapy;Therapeutic activities;Cognitive remediation/compensation;Neuromuscular education    Plan  give info on Independent living, functional reaching into cabinets high and low, long sit for ADLS    Consulted and Agree with Plan of Care  Patient;Family member/caregiver    Family Member Consulted  wife,interpreter via video       Patient will benefit from skilled therapeutic intervention in order to improve the following deficits and impairments:   Body Structure / Function / Physical Skills: ADL, UE functional use, Endurance, Balance, Flexibility, Pain, FMC, ROM, GMC, Coordination, Sensation, Decreased knowledge of precautions, Decreased knowledge of use of DME, IADL, Strength, Dexterity, Mobility, Tone       Visit Diagnosis: Other symptoms and signs involving the nervous system  Other disturbances of skin sensation  Muscle weakness (generalized)  Abnormal posture  Stiffness of right shoulder, not elsewhere classified  Stiffness of left shoulder, not elsewhere  classified    Problem List Patient Active Problem List   Diagnosis Date Noted  . 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 Neurogenic bowel 01/22/2019  . Neurogenic bladder 01/22/2019  . Myofascial pain 01/22/2019  . 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 Quadriplegia ([facility]) 12/30/2018    Aymar Whitfill 01/27/2019, 5:47 PM Keene BreathKathryn Davius Goudeau, OTR/L [phone] Phone: [phone] 5:54 PM 01/27/19 [facility] [address] [place], [place], [address] Phone: [phone]   Fax:  [phone]  Name: Lonzo CandyCarlos Mcconico MRN: [id number] Date of Birth: August 09, 1965

## 2019-02-01 ENCOUNTER — Ambulatory Visit: Payer: Medicaid Other | Admitting: Physical Therapy

## 2019-02-01 ENCOUNTER — Other Ambulatory Visit: Payer: Self-pay

## 2019-02-01 ENCOUNTER — Encounter: Payer: Medicaid Other | Admitting: Physical Medicine and Rehabilitation

## 2019-02-01 ENCOUNTER — Encounter: Payer: Self-pay | Admitting: Physical Medicine and Rehabilitation

## 2019-02-01 ENCOUNTER — Ambulatory Visit: Payer: Medicaid Other | Admitting: Occupational Therapy

## 2019-02-01 ENCOUNTER — Encounter: Payer: Self-pay | Admitting: Physical Therapy

## 2019-02-01 ENCOUNTER — Encounter: Payer: Self-pay | Admitting: Occupational Therapy

## 2019-02-01 VITALS — BP 105/62 | HR 69 | Temp 98.2°F

## 2019-02-01 DIAGNOSIS — M6281 Muscle weakness (generalized): Secondary | ICD-10-CM | POA: Diagnosis not present

## 2019-02-01 DIAGNOSIS — R293 Abnormal posture: Secondary | ICD-10-CM

## 2019-02-01 DIAGNOSIS — S24101A Unspecified injury at T1 level of thoracic spinal cord, initial encounter: Secondary | ICD-10-CM | POA: Diagnosis not present

## 2019-02-01 DIAGNOSIS — G8250 Quadriplegia, unspecified: Secondary | ICD-10-CM | POA: Diagnosis not present

## 2019-02-01 DIAGNOSIS — M7918 Myalgia, other site: Secondary | ICD-10-CM

## 2019-02-01 DIAGNOSIS — R29818 Other symptoms and signs involving the nervous system: Secondary | ICD-10-CM

## 2019-02-01 DIAGNOSIS — M542 Cervicalgia: Secondary | ICD-10-CM

## 2019-02-01 DIAGNOSIS — G8252 Quadriplegia, C1-C4 incomplete: Secondary | ICD-10-CM

## 2019-02-01 DIAGNOSIS — R208 Other disturbances of skin sensation: Secondary | ICD-10-CM

## 2019-02-01 DIAGNOSIS — Z95828 Presence of other vascular implants and grafts: Secondary | ICD-10-CM | POA: Diagnosis not present

## 2019-02-01 NOTE — Progress Notes (Signed)
  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    Patient here for trigger point injections for  Consent done and on chart.  Cleaned areas with alcohol and injected using a 27 gauge 1.5 inch needle  Injected 5.5 cc Using 1% Lidocaine with no EPI  Upper traps B/L Levators B/L Posterior scalenes B/L Middle scalenes Splenius Capitus B/L Pectoralis Major B/L Rhomboids B/L 2 Infraspinatus Teres Major/minor Thoracic paraspinals Lumbar paraspinals Other injections-    Patient's level of pain prior was 6/10 Current level of pain after injections is 0-1/10  There was no bleeding or complications.  Patient was advised to drink a lot of water on day after injections to flush system Will have increased soreness for 12-48 hours after injections.  Can use Lidocaine patches the day AFTER injections Can use theracane tomorrow Can use heating pad 4-6 hours AFTER injections

## 2019-02-01 NOTE — Progress Notes (Signed)
Subjective:    Patient ID: [name], male    DOB: [birth year], 53 y.o.   MRN: [id number]  HPI  Pain Inventory Average Pain 6 Pain Right Now 6 My pain is sharp  In the last 24 hours, has pain interfered with the following? General activity 10 Relation with others 10 Enjoyment of life 10 What TIME of day is your pain at its worst? night Sleep (in general) Good  Pain is worse with: unsure Pain improves with: other Relief from Meds: na  Mobility use a wheelchair needs help with transfers  Function disabled: date disabled .  Neuro/Psych bladder control problems bowel control problems weakness numbness tremor tingling trouble walking depression anxiety  Prior Studies Any changes since last visit?  no  Physicians involved in your care Any changes since last visit?  no   Family History  Problem Relation Age of Onset  . Diabetes Mother   . Diabetes Father    Social History   Socioeconomic History  . Marital status: Married    Spouse name: Not on file  . Number of children: 4  . Years of education: 6th grade  . Highest education level: Not on file  Occupational History  . Occupation: Disabled  Tobacco Use  . Smoking status: Never Smoker  . Smokeless tobacco: Never Used  Substance and Sexual Activity  . Alcohol use: Yes    Comment: social  . Drug use: No  . Sexual activity: Not on file  Other Topics Concern  . Not on file  Social History Narrative   Lives at home with his family.   Right-handed.   No daily use of caffeine.   Social Determinants of Health   Financial Resource Strain:   . Difficulty of Paying Living Expenses: Not on file  Food Insecurity:   . Worried About Charity fundraiser in the Last Year: Not on file  . Ran Out of Food in the Last Year: Not on file  Transportation Needs:   . Lack of Transportation (Medical): Not on file  . Lack of Transportation (Non-Medical): Not on file  Physical Activity:   . Days of Exercise  per Week: Not on file  . Minutes of Exercise per Session: Not on file  Stress:   . Feeling of Stress : Not on file  Social Connections:   . Frequency of Communication with Friends and Family: Not on file  . Frequency of Social Gatherings with Friends and Family: Not on file  . Attends Religious Services: Not on file  . Active Member of Clubs or Organizations: Not on file  . Attends Archivist Meetings: Not on file  . Marital Status: Not on file   Past Surgical History:  Procedure Laterality Date  . CERVICAL SPINE SURGERY     Past Medical History:  Diagnosis Date  . Diabetes mellitus without complication ([place])   . History of cervical fracture    BP 105/62   Pulse 69   Temp 98.2 F (36.8 C)   SpO2 95%   Opioid Risk Score:   Fall Risk Score:  `1  Depression screen PHQ 2/9  Depression screen [facility] 2/9 11/26/2018 10/13/2018  Decreased Interest 1 1  Down, Depressed, Hopeless 1 1  PHQ - 2 Score 2 2  Altered sleeping 1 2  Tired, decreased energy 1 2  Change in appetite 1 0  Feeling bad or failure about yourself  1 1  Trouble concentrating 1 0  Moving slowly or fidgety/restless  0 0  Suicidal thoughts 1 0  PHQ-9 Score 8 7    Review of Systems  Constitutional: Negative.   HENT: Negative.   Eyes: Negative.   Respiratory: Negative.   Cardiovascular: Negative.   Gastrointestinal:       Bowel control  Genitourinary:       Bladder control  Musculoskeletal: Positive for arthralgias and myalgias.  Skin: Negative.   Allergic/Immunologic: Negative.   Neurological: Positive for weakness.  Hematological: Negative.   Psychiatric/Behavioral: Positive for dysphoric mood.  All other systems reviewed and are negative.      Objective:   Physical Exam        Assessment & Plan:

## 2019-02-01 NOTE — Patient Instructions (Addendum)
Patient's level of pain prior was 6/10 Current level of pain after injections is 0-1/10  There was no bleeding or complications.  Patient was advised to drink a lot of water on day after injections to flush system Will have increased soreness for 12-48 hours after injections.  Can use theracane  Or tennis balls TOMORROW Can use heating pad 4-6 hours AFTER injections

## 2019-02-01 NOTE — Therapy (Signed)
[facility] [address] [place], [place], [address] Phone: [phone]   Fax:  [phone]  Physical Therapy Treatment  Patient Details  Name: [name] MRN: [id number] Date of Birth: [birth year] Referring Provider (PT): Doran Clay, MD - referring physician.  Patient to have care transferred to [facility].  New PCP: Kerin Perna, NP   Encounter Date: 02/01/2019  PT End of Session - 02/01/19 1249    Visit Number  14    Number of Visits  16   eval + initial 3 visits from Medicaid   Date for PT Re-Evaluation  02/09/19    Authorization Type  Medicaid    Authorization Time Period  11/18/18-02/09/19; 12 visits    Authorization - Visit Number  10    Authorization - Number of Visits  12    PT Start Time  1150    PT Stop Time  1239    PT Time Calculation (min)  49 min    Equipment Utilized During Treatment  --   interpreter services utilized   Activity Tolerance  Patient tolerated treatment well    Behavior During Therapy  WFL for tasks assessed/performed       Past Medical History:  Diagnosis Date  . Diabetes mellitus without complication ([place])   . History of cervical fracture     Past Surgical History:  Procedure Laterality Date  . CERVICAL SPINE SURGERY      There were no vitals filed for this visit.  Subjective Assessment - 02/01/19 1151    Subjective  Patient had spoken to OT about his bed situation, no updates. PT will call company this week. PT reached out to w/c company about updates and pt states that the company is waiting for something to be approved for the w/c but no indication of a timeframe.    Patient is accompained by:  Family member    Pertinent History  C1 burst fx with [place] transection s/p C4-T6 spinal fusion (6/10), incomplete quadriplegia, previous episodes of autonomic dysreflexia in inpatient rehab    Patient Stated Goals  wants to do exercises as soon as he can, wants to be  able to feel his legs again if he can.    Currently in Pain?  No/denies                       [facility] Adult PT Treatment/Exercise - 02/01/19 1251      Transfers   Transfers  Lateral/Scoot Transfers    Lateral/Scoot Transfers  2: Max assist;3: Mod assist    Lateral/Scoot Transfer Details (indicate cue type and reason)  Pt performed transfer x2 w/c <> mat table with sliding board. Initially, pt required maxA with cues for forward trunk leaning and assist at the hips for weight shifting and scoot. After session, pt performed transfer with role playing as if PT was a family member and pt had to verbalize when he needed assistance and how to assist pt. Pt required modA at the knees to prevent sliding forward off sliding board and to prevent excessive abduction. PT assist for feet positioning on 4"step.       Therapeutic Activites    Therapeutic Activities  ADL's    ADL's  Pt seated at edge of mat with feet on 4" step for better balance. Performed x1 rep of lateral scooting on sliding board with chair in front of pt to facilitate forward trunk leaning, lateral wt shift, and inc truncal rotation.  Cues for positioning and sequencing of movement. Required therapist assist for foot positioning and initially at the hips for lateral wt shifting and scoot when going to the L. Pt demonstrated greater initiative to the R requiring modA assist at the knees to prevent falling forward and excessive knee abduction.              PT Education - 02/01/19 1301    Education Details  Pt brought up needing documentation to justify for handicap sticker. PT discussed needing to go to individual who is able to notarize if that is needed. If further justification for handicap sticker is needed, PT can fill out necessary forms. PT discussed contacting company who assessed the pt's bed to further justify and express concerns of needing a new bed.    Person(s) Educated  Patient    Methods  Explanation     Comprehension  Verbalized understanding       PT Short Term Goals - 12/23/18 1411      PT SHORT TERM GOAL #1   Title  Patient will direct initial HEP independently to wife/caregivers for stretching LEs in order to prevent risk of contractures. ALL STGS DUE BY 3RD VISIT - 11/05/18    Baseline  pt and wife have been instructed in initial HEP for LE stretching.    Time  3    Period  --   visits   Status  Achieved      PT SHORT TERM GOAL #2   Title  Patient will perform sitting balance with A/P weight shifting with mod A in order to prepare for sliding board lateral transfers from mat <> w/c.    Baseline  Long sit with trunk flexion CGA for safety, short sit edge of mat with reaching for targets close supervision to min assist for safety.    Status  Achieved      PT SHORT TERM GOAL #3   Title  Patient will perform rolling right and left in bed with mod A in order to decrease caregiver burden and for pressure relief.    Baseline  max assist with rolling    Status  Partially Met      PT SHORT TERM GOAL #4   Title  Patient will perform scoot pivot t/f on sliding board from w/c <> mat table (from one surface elevated with gravity assisting) with max A.    Baseline  max assist but 2nd person for safety still.    Status  Partially Met        PT Long Term Goals - 02/01/19 2112      PT LONG TERM GOAL #1   Title  Patient will direct final HEP independently to wife/caregivers for stretching LEs in order to prevent risk of contractures. ALL LTGS DUE BY 12TH VISIT OR 02/09/2019    Baseline  dependent    Time  12    Period  --   visits   Status  Revised      PT LONG TERM GOAL #2   Title  When pt receives his own w/c (vs. loaner chair) pt will demonstrate independence with w/c parts in order to safely prepare for transfers.    Baseline  Not met to date - pt's wheelchair is still awaiting insurance approval.  Pt is managing arm rests, seat belt and seat functions of loaner w/c    Status  Not  Met      PT LONG TERM GOAL #3   Title  Patient  will perform sitting balance with A/P and M/L weight shifting with supervision/min A in order to perform sliding board lateral transfers from mat <> w/c.    Baseline  Min A leaning to R, mod A leaning to L.  Supervision for ant/posterior    Status  Achieved      PT LONG TERM GOAL #4   Title  Patient will perform rolling right and left in bed with min A in order to decrease caregiver burden and for pressure relief.    Baseline  Total A    Time  12    Period  Weeks    Status  Revised      PT LONG TERM GOAL #5   Title  Patient will perform scoot pivot t/f on sliding board from w/c <> mat table on level surface with min-mod A.    Baseline  min A today on level surface (02/01/19)    Status  Achieved            Plan - 02/01/19 1249    Clinical Impression Statement  Today's session focused on lateral scoot transfers for more pt independence with transfer at PT and at home and facilitate truncal rotation. With practice and cues for positioning and sequencing, with repetition pt was able to demonstrate transfer with Min A for level transfer only requiring assist at the knees to prevent ABD and for foot positioning. Pt demonstrated improved core stability in upright position. Pt may benefit from continued skilled therapy to address deficits.    PT Treatment/Interventions  ADLs/Self Care Home Management;DME Instruction;Functional mobility training;Therapeutic activities;Therapeutic exercise;Balance training;Neuromuscular re-education;Wheelchair mobility training;Patient/family education;Passive range of motion;Electrical Stimulation;Orthotic Fit/Training;Manual techniques;Dry needling;Energy conservation    PT Next Visit Plan  Finish checking LTG and recertify - request more visits from Medicaid - 1x/week x 6 weeks.  Also needs to schedule appointments for Jan/Feb.  Updates on the bed - does he have a regular bed he could transfer on to just to work on  long sitting, rolling, etc as part of HEP?  Slideboard transfers progress from level to uneven transfers.  Check HEP- how are stretches going with wife?  rolling with UE momentum; pushing up to sitting - long sitting, circle sitting wiht dynamic reaching. Short Sitting balance A/P and M/L, range of motion hips and shoulders, UE strengthening.  Management of LE during transfers and bed mobility.    Recommended Other Services  [employer] - waiting on insurance approval of new wheelchair    Consulted and Agree with Plan of Care  Patient;Family member/caregiver       Patient will benefit from skilled therapeutic intervention in order to improve the following deficits and impairments:  Decreased activity tolerance, Decreased balance, Decreased mobility, Decreased endurance, Decreased range of motion, Decreased strength, Impaired flexibility, Impaired sensation, Impaired UE functional use, Pain, Impaired tone, Postural dysfunction  Visit Diagnosis: Other symptoms and signs involving the nervous system  Other disturbances of skin sensation  Muscle weakness (generalized)  Abnormal posture  Cervicalgia  Quadriplegia, C1-C4 incomplete ([place])     Problem List Patient Active Problem List   Diagnosis Date Noted  . 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 Neurogenic bowel 01/22/2019  . Neurogenic bladder 01/22/2019  . Myofascial pain 01/22/2019  . Spinal cord injury at T1-T6 level ([place]) 12/30/2018  . Quadriplegia ([place]) 12/30/2018    Juliann Pulse SPT 02/01/2019, 1:05 PM  [facility] [address] [place], [place], [address] Phone: [phone]   Fax:  [phone]  Name: Sunny Gains MRN: [id number] Date of Birth: 04/09/1965

## 2019-02-01 NOTE — Therapy (Signed)
[facility] [address] [place], [place], [address] Phone: [phone]   Fax:  [phone]  Occupational Therapy Treatment  Patient Details  Name: [name] MRN: [id number] Date of Birth: 1965/08/06 Referring Provider (OT): Dr. Alois Cliche'Brien   Encounter Date: 02/01/2019  OT End of Session - 02/01/19 1238    Visit Number  12    Number of Visits  13    Date for OT Re-Evaluation  02/11/19   end date extended   Authorization Type  Medicaid    Authorization Time Period  12 visits  through 01/27/2019, 2 visits approved through 02/10/2019 - will seek approval for 3 visits over first month in january of 2021    Authorization - Visit Number  12    Authorization - Number of Visits  13   eval plus 12 visits   OT Start Time  1102    OT Stop Time  1145    OT Time Calculation (min)  43 min    Activity Tolerance  Patient tolerated treatment well       Past Medical History:  Diagnosis Date  . Diabetes mellitus without complication ([facility])   . History of cervical fracture     Past Surgical History:  Procedure Laterality Date  . CERVICAL SPINE SURGERY      There were no vitals filed for this visit.  Subjective Assessment - 02/01/19 1109    Subjective   This transfer is hard    Pertinent History  1 burst fx with [place] transection s/p C4-T6 spinal fusion (6/10), incomplete para deficits and s/p distal radius fx. Pt was d/c home with family on 09/24/2018.  Pt's PMH is significant for: T2DM, recent LE DVT    Limitations  cleared for AA/ROM, P/ROM and strengthening to RUE, (pt was originally NWB, will plan to perform WB to tolerance as pt is now greater than 12 weeks post injury and cleared for strengthening.    Patient Stated Goals  get better    Currently in Pain?  No/denies                   OT Treatments/Exercises (OP) - 02/01/19 0001      ADLs   Overall ADLs  Reviewed medicaid allowed visits for 2021 and  reviewed OT POC - pt and wife in agreement. Wife states there is still much confusion about hospital bed at home - [facility] who organized ordering and deliver of equipment and encouraged pt and wife to contact her again and update her. PT also to call about wheelchair.     Functional Mobility  Practiced sliding board transfer to padded commode seat - pt needed max a and stand by assist of 1.  Pt able to complete wheelchair set up with cues only.  Pt needs assist to place board when in wheelchair.     ADL Comments  Assessed goals - see goal section for updates.               OT Short Term Goals - 02/01/19 1232      OT SHORT TERM GOAL #1   Title  I with inital HEP- 12/14/2018    Baseline  dependent    Time  6    Period  Weeks    Status  Achieved    Target Date  12/14/18      OT SHORT TERM GOAL #2   Title  Pt will donn shirt  with min A    Baseline  mod A    Time  6    Period  Weeks    Status  Achieved   in wheelchair     OT SHORT TERM GOAL #3   Title  Pt will demonstrate bilateral wrist flexion/ extension WFLs for ADLs/ IADLS.    Baseline  45/45 wrist flexion/ extension bilateral UE's    Time  6    Period  Weeks    Status  Achieved   12/16/2018 LUE =58* and RUE = 60* however pt still with pain end ROM for R wrist     OT SHORT TERM GOAL #4   Title  Pt will perform LB dressing with mod A - 03/15/2019    Baseline  dependent    Time  6    Period  Weeks    Status  On-going   pt is awaiting new hospital bed     OT SHORT TERM GOAL #5   Title  Pt will perform UB bathing with Min A, and LB bathing with mod A    Baseline  UB mod A, LB total A in bed    Time  6    Period  Weeks    Status  Achieved   12/16/2018 in long sitting in clinic; pt's hospital bed is poor and does not allow pt to complete at home. Attempting to contact [employer] to determine if more suitable hospital bed can be issued. WIfe to call clinic with rep name     OT SHORT TERM  GOAL #6   Title  Pt will demonstrate ability to retireve a lightweight object at 110 shoulder flexion for RUE for increased ease with ADLs.    Baseline  95* shoulder flexion    Time  6    Period  Weeks    Status  Achieved      OT SHORT TERM GOAL #7   Title  Pt will demonstrate ability to retrieve a lightweight object at 115* shoulder flexion  with LUE for increased ease with ADLs,    Baseline  105*    Time  6    Period  Weeks    Status  Achieved        OT Long Term Goals - 02/01/19 1233      OT LONG TERM GOAL #1   Title  Pt will donn shirt with setup-01/28/2019    Baseline  mod A    Time  12    Period  Weeks    Status  Achieved      OT LONG TERM GOAL #2   Title  Pt will perform LB dressing with min A - 04/26/2019    Baseline  dependent/ total A    Time  12    Period  Weeks    Status  On-going   pt is awaitng new hospital bed     OT LONG TERM GOAL #3   Title  Pt will transfer to [facility] with mod A using sliding board - 04/26/2019    Baseline  total A +2 for basic transfers    Time  12    Period  Weeks    Status  On-going      OT LONG TERM GOAL #4   Title  Pt and family will verbalize understanding regarding recommendations for shower chair, DME    Baseline  dependent, pt does not yet have shower chair, currently bathing in bed    Time  12    Period  Weeks    Status  Achieved      OT LONG TERM GOAL #5   Title  Pt will increase bilateral grip strength by 10 lbs for increased ease with ADLs/ IADLS.    Baseline  RUE grip 5 lbs, LUE 3 lbs    Time  12    Period  Weeks    Status  Achieved   01/04/2019 R = 26 pounds, L = 3 pounds fro combined bilateral grip strength of 29 pounds (baseline combined 8 pounds)           Plan - 02/01/19 1235    Clinical Impression Statement  Pt has made excellent progress.  Pt will benefit from ongoing OT services in January 2021 to continue to address remaining goals.  See goal section for updates. Will submit for 3 visits in January of  2021    OT Occupational Profile and History  Detailed Assessment- Review of Records and additional review of physical, cognitive, psychosocial history related to current functional performance    Occupational performance deficits (Please refer to evaluation for details):  ADL's;IADL's;Work;Leisure;Social Participation    Body Structure / Function / Physical Skills  ADL;UE functional use;Endurance;Balance;Flexibility;Pain;FMC;ROM;GMC;Coordination;Sensation;Decreased knowledge of precautions;Decreased knowledge of use of DME;IADL;Strength;Dexterity;Mobility;Tone    Rehab Potential  Good    Clinical Decision Making  Several treatment options, min-mod task modification necessary    Comorbidities Affecting Occupational Performance:  May have comorbidities impacting occupational performance    Modification or Assistance to Complete Evaluation   No modification of tasks or assist necessary to complete eval    OT Frequency  Other (comment)   1x/wk x1 week then 3 visits in 1 month starting in January of 2021   Plan  give info on Independent living, functional reaching, functional mobility, balance, ADL's    OT Home Exercise Plan  A/ROM to R wrist issued, pt reports performing shoulder flexion/ abduction in supine at home    Consulted and Agree with Plan of Care  Patient;Family member/caregiver    Family Member Consulted  wife,interpreter       Patient will benefit from skilled therapeutic intervention in order to improve the following deficits and impairments:   Body Structure / Function / Physical Skills: ADL, UE functional use, Endurance, Balance, Flexibility, Pain, FMC, ROM, GMC, Coordination, Sensation, Decreased knowledge of precautions, Decreased knowledge of use of DME, IADL, Strength, Dexterity, Mobility, Tone       Visit Diagnosis: Other symptoms and signs involving the nervous system - Plan: Ot plan of care cert/re-cert  Other disturbances of skin sensation - Plan: Ot plan of care  cert/re-cert  Muscle weakness (generalized) - Plan: Ot plan of care cert/re-cert  Abnormal posture - Plan: Ot plan of care cert/re-cert  Quadriplegia, C1-C4 incomplete (HCC) - Plan: Ot plan of care cert/re-cert    Problem List Patient Active Problem List   Diagnosis Date Noted  . 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 Neurogenic bowel 01/22/2019  . Neurogenic bladder 01/22/2019  . Myofascial pain 01/22/2019  . 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 Quadriplegia ([facility]) 12/30/2018    Norton Pastel, OTR/L 02/01/2019, 12:43 PM  [facility] [address] [place], [place], [address] Phone: [phone]   Fax:  [phone]  Name: [name] MRN: [id number] Date of Birth: 04/01/1965

## 2019-02-02 ENCOUNTER — Ambulatory Visit: Payer: Medicaid Other | Admitting: Occupational Therapy

## 2019-02-09 ENCOUNTER — Ambulatory Visit: Payer: Medicaid Other

## 2019-02-09 ENCOUNTER — Other Ambulatory Visit: Payer: Self-pay

## 2019-02-09 DIAGNOSIS — R29818 Other symptoms and signs involving the nervous system: Secondary | ICD-10-CM

## 2019-02-09 DIAGNOSIS — R208 Other disturbances of skin sensation: Secondary | ICD-10-CM

## 2019-02-09 DIAGNOSIS — M6281 Muscle weakness (generalized): Secondary | ICD-10-CM | POA: Diagnosis not present

## 2019-02-09 DIAGNOSIS — R293 Abnormal posture: Secondary | ICD-10-CM

## 2019-02-09 DIAGNOSIS — G8252 Quadriplegia, C1-C4 incomplete: Secondary | ICD-10-CM

## 2019-02-09 DIAGNOSIS — M542 Cervicalgia: Secondary | ICD-10-CM

## 2019-02-09 NOTE — Therapy (Signed)
[facility] [address] [place], [place], [address] Phone: [phone]   Fax:  [phone]  Physical Therapy Treatment & Re-certification  Patient Details  Name: [name] MRN: [id number] Date of Birth: 30-Jan-1966 Referring Provider (PT): Doran Clay, MD - referring physician.  Patient to have care transferred to [facility]  New PCP: Kerin Perna, NP   Encounter Date: 02/09/2019  PT End of Session - 02/09/19 1057    Visit Number  15    Number of Visits  16   eval + initial 3 visits from Medicaid   Date for PT Re-Evaluation  02/09/19    Authorization Type  Medicaid    Authorization Time Period  11/18/18-02/09/19; 12 visits    Authorization - Visit Number  11    Authorization - Number of Visits  12    PT Start Time  1107    PT Stop Time  8413    PT Time Calculation (min)  51 min    Equipment Utilized During Treatment  Gait belt;Other (comment)   interpreter services utilized   Activity Tolerance  Patient tolerated treatment well    Behavior During Therapy  [facility] for tasks assessed/performed       Past Medical History:  Diagnosis Date  . Diabetes mellitus without complication ([place])   . History of cervical fracture     Past Surgical History:  Procedure Laterality Date  . CERVICAL SPINE SURGERY      There were no vitals filed for this visit.  Subjective Assessment - 02/09/19 1056    Subjective  Has not heard any updates on powerchair. Has an aide coming in for 4 hours 2x/wk that has helped with LE stretches - started last week.    Patient is accompained by:  Family member    Pertinent History  C1 burst fx with [place] transection s/p C4-T6 spinal fusion (6/10), incomplete quadriplegia, previous episodes of autonomic dysreflexia in inpatient rehab    Patient Stated Goals  wants to do exercises as soon as he can, wants to be able to feel his legs again if he can.    Currently in Pain?  No/denies                        [facility] Adult PT Treatment/Exercise - 02/09/19 1217      Bed Mobility   Bed Mobility  Rolling Right;Rolling Left;Supine to Sit    Rolling Right  Moderate Assistance - Patient 50-74% PT positioned opposite leg over leg to rolling side and then min/mod assist to roll with using arms as momentum. Performed x 2 with rolling both ways.   Rolling Left  Moderate Assistance - Patient 50-74%    Supine to Sit  Maximal Assistance - Patient - Patient 25-49% Pt was cued to come down on right forearm and PT then assisted with legs. 2nd person at trunk just for safety.     Transfers   Transfers  Lateral/Scoot Transfers    Lateral/Scoot Transfers  4: Min assist;3: Mod assist    Lateral/Scoot Transfer Details (indicate cue type and reason)  Therapist assist to block bilateral LE to prevent sliding out of w/c when wt shifting fwd. Intermittent need for assist at pelvis for lateral wt shift. Pt demonstrated greater initiation and faciliation of lateral wt shifting. Requires multiple attempts.  Pt was given verbal cues to lean forward and to move head opposite direction he wants hips to go.  Therapeutic Activites    Therapeutic Activities  Other Therapeutic Activities    Other Therapeutic Activities  In long sitting, pt performs contralateral UE reach across midline for bean bags one at a time and bring to other side. Therapist provided min facilitation for truncal rotation. Pt continued exercise in long sitting while on L elbow/forearm - required stabilization at L shoulder to prevent instability. Pt tried to come back up to long sitting from wt on elbow position and utilized RUE on RLE to pull self up, required min/modA from therapist. Repeated on opposite side however instead of coming down onto elbow, pt initiated wt shift onto extended RUE to allow for reach across midline. In long sitting pt performs 6 minutes seated balance without support from therapist - required  intermittent UE support to steady and prevent posterior wt displacement. Attempted to get pt into long sitting with BUE fully extended behind self to support in sitting with upright posture and walking hands forward to come into forward trunk lean, however, too challenging at this time requiring max support from therapist.                PT Short Term Goals - 12/23/18 1411      PT SHORT TERM GOAL #1   Title  Patient will direct initial HEP independently to wife/caregivers for stretching LEs in order to prevent risk of contractures. ALL STGS DUE BY 3RD VISIT - 11/05/18    Baseline  pt and wife have been instructed in initial HEP for LE stretching.    Time  3    Period  --   visits   Status  Achieved      PT SHORT TERM GOAL #2   Title  Patient will perform sitting balance with A/P weight shifting with mod A in order to prepare for sliding board lateral transfers from mat <> w/c.    Baseline  Long sit with trunk flexion CGA for safety, short sit edge of mat with reaching for targets close supervision to min assist for safety.    Status  Achieved      PT SHORT TERM GOAL #3   Title  Patient will perform rolling right and left in bed with mod A in order to decrease caregiver burden and for pressure relief.    Baseline  max assist with rolling    Status  Partially Met      PT SHORT TERM GOAL #4   Title  Patient will perform scoot pivot t/f on sliding board from w/c <> mat table (from one surface elevated with gravity assisting) with max A.    Baseline  max assist but 2nd person for safety still.    Status  Partially Met        PT Long Term Goals - 02/09/19 1109      PT LONG TERM GOAL #1   Title  Patient will direct final HEP independently to wife/caregivers for stretching LEs in order to prevent risk of contractures. ALL LTGS DUE BY 12TH VISIT OR 02/09/2019    Baseline  dependent    Time  12    Period  --   visits   Status  Not Met      PT LONG TERM GOAL #2   Title  When pt  receives his own w/c (vs. loaner chair) pt will demonstrate independence with w/c parts in order to safely prepare for transfers.    Baseline  Not met to date - pt's wheelchair is still  awaiting insurance approval.  Pt is managing arm rests, seat belt and seat functions of loaner w/c    Status  Not Met      PT LONG TERM GOAL #3   Title  Patient will perform sitting balance with A/P and M/L weight shifting with supervision/min A in order to perform sliding board lateral transfers from mat <> w/c.    Baseline  Min A leaning to R, mod A leaning to L.  Supervision for ant/posterior    Status  Achieved      PT LONG TERM GOAL #4   Title  Patient will perform rolling right and left in bed with min A in order to decrease caregiver burden and for pressure relief.    Baseline  modA for LE placement and initiating weight shift    Time  12    Period  Weeks    Status  Partially Met      PT LONG TERM GOAL #5   Title  Patient will perform scoot pivot t/f on sliding board from w/c <> mat table on level surface with min-mod A.    Baseline  min A today on level surface (02/01/19)    Status  Achieved         Updated goals: PT Long Term Goals - 02/09/19 1412      PT LONG TERM GOAL #1   Title  Patient will direct final HEP independently to wife/caregivers for stretching LEs in order to prevent risk of contractures. ALL LTGS DUE BY 03/23/2019.    Baseline  dependent with caregivers starting to perform stretches per patient.    Time  6    Period  Weeks   visits   Status  On-going    Target Date  03/23/19      PT LONG TERM GOAL #2   Title  When pt receives his own w/c (vs. loaner chair) pt will demonstrate independence with w/c parts in order to safely prepare for transfers.    Baseline  Not met to date - pt's wheelchair is still awaiting insurance approval.  Pt is managing arm rests, seat belt and seat functions of loaner w/c    Time  6    Period  Weeks    Status  On-going    Target Date  03/23/19       PT LONG TERM GOAL #3   Title  Patient will perform long sit without UE support >/= 5 minutes with Supervision only for improved ability to assist with ADLs in bed.    Baseline  02/09/19: can remain seated for 6 minutes with intermittent UE support and CGA for safety    Time  6    Period  Weeks    Status  New    Target Date  03/23/19      PT LONG TERM GOAL #4   Title  Patient will perform rolling right and left in bed with min A in order to decrease caregiver burden and for pressure relief.    Baseline  02/09/19: modA for LE placement and initiating weight shift    Time  6    Period  Weeks    Status  On-going    Target Date  03/23/19      PT LONG TERM GOAL #5   Title  Patient will perform lateral scoot transfer on sliding board from w/c <> mat table on unlevel surface with minA/mod assist (not including placement of board)    Baseline  02/01/19:  min A today on level surface (02/01/19)    Time  6    Period  Weeks    Status  Revised    Target Date  03/23/19            Plan - 02/09/19 1057    Clinical Impression Statement  Today's session focused on assessing remaining LTGs and recertification. Pt continues to demonstrate improvement in UE strength and core stability to assist with transfers and maintain sitting balance. He has achieved and partially 3 out of 5 LTGs. He has not received new power w/c - no indication of when he will get it. He requires modA for bed mobility for LE placement and initiating weight shift however he facilitates UE momentum without cues. Pt may benefit from continued skilled therapy to address deficits.    PT Frequency  1x / week    PT Duration  6 weeks    PT Treatment/Interventions  ADLs/Self Care Home Management;DME Instruction;Functional mobility training;Therapeutic activities;Therapeutic exercise;Balance training;Neuromuscular re-education;Wheelchair mobility training;Patient/family education;Passive range of motion;Electrical Stimulation;Orthotic  Fit/Training;Manual techniques;Dry needling;Energy conservation    PT Next Visit Plan  Updates on the bed - does he have a regular bed he could transfer on to just to work on long sitting, rolling, etc as part of HEP?  Slideboard transfers progress from level to uneven transfers.  Check HEP- how are stretches going with wife?  rolling with UE momentum; pushing up to sitting - long sitting, circle sitting wiht dynamic reaching. Short Sitting balance A/P and M/L, range of motion hips and shoulders, UE strengthening.  Management of LE during transfers and bed mobility. Sitting balance without UE support & dynamic reaching.    Consulted and Agree with Plan of Care  Patient;Family member/caregiver       Patient will benefit from skilled therapeutic intervention in order to improve the following deficits and impairments:  Decreased activity tolerance, Decreased balance, Decreased mobility, Decreased endurance, Decreased range of motion, Decreased strength, Impaired flexibility, Impaired sensation, Impaired UE functional use, Pain, Impaired tone, Postural dysfunction  Visit Diagnosis: Other symptoms and signs involving the nervous system  Other disturbances of skin sensation  Muscle weakness (generalized)  Abnormal posture  Cervicalgia  Quadriplegia, C1-C4 incomplete ([place])     Problem List Patient Active Problem List   Diagnosis Date Noted  . 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 Neurogenic bowel 01/22/2019  . Neurogenic bladder 01/22/2019  . Myofascial pain 01/22/2019  . Spinal cord injury at T1-T6 level ([place]) 12/30/2018  . Quadriplegia ([place]) 12/30/2018    Juliann Pulse SPT 02/09/2019, 12:31 PM Cherly Anderson, PT, DPT, NCS  [facility] [address] [place], [place], [address] Phone: [phone]   Fax:  [phone]  Name: Thiago Ragsdale MRN: [id number] Date of Birth: [birth year]

## 2019-02-10 ENCOUNTER — Ambulatory Visit: Payer: Medicaid Other | Admitting: Occupational Therapy

## 2019-02-17 ENCOUNTER — Encounter: Payer: Self-pay | Admitting: Physical Therapy

## 2019-02-17 ENCOUNTER — Ambulatory Visit: Payer: Medicaid Other | Attending: Physical Medicine and Rehabilitation | Admitting: Occupational Therapy

## 2019-02-17 ENCOUNTER — Encounter: Payer: Self-pay | Admitting: Occupational Therapy

## 2019-02-17 ENCOUNTER — Other Ambulatory Visit: Payer: Self-pay

## 2019-02-17 ENCOUNTER — Ambulatory Visit: Payer: Medicaid Other | Admitting: Physical Therapy

## 2019-02-17 DIAGNOSIS — G8252 Quadriplegia, C1-C4 incomplete: Secondary | ICD-10-CM | POA: Diagnosis present

## 2019-02-17 DIAGNOSIS — R293 Abnormal posture: Secondary | ICD-10-CM

## 2019-02-17 DIAGNOSIS — R29818 Other symptoms and signs involving the nervous system: Secondary | ICD-10-CM

## 2019-02-17 DIAGNOSIS — R209 Unspecified disturbances of skin sensation: Secondary | ICD-10-CM | POA: Insufficient documentation

## 2019-02-17 DIAGNOSIS — M25631 Stiffness of right wrist, not elsewhere classified: Secondary | ICD-10-CM | POA: Insufficient documentation

## 2019-02-17 DIAGNOSIS — G8222 Paraplegia, incomplete: Secondary | ICD-10-CM | POA: Insufficient documentation

## 2019-02-17 DIAGNOSIS — M25611 Stiffness of right shoulder, not elsewhere classified: Secondary | ICD-10-CM | POA: Diagnosis present

## 2019-02-17 DIAGNOSIS — M6281 Muscle weakness (generalized): Secondary | ICD-10-CM

## 2019-02-17 DIAGNOSIS — M25632 Stiffness of left wrist, not elsewhere classified: Secondary | ICD-10-CM | POA: Diagnosis present

## 2019-02-17 DIAGNOSIS — M25612 Stiffness of left shoulder, not elsewhere classified: Secondary | ICD-10-CM | POA: Diagnosis present

## 2019-02-17 DIAGNOSIS — M542 Cervicalgia: Secondary | ICD-10-CM

## 2019-02-17 DIAGNOSIS — R208 Other disturbances of skin sensation: Secondary | ICD-10-CM

## 2019-02-17 NOTE — Therapy (Signed)
[facility] [address] [place], [place], [address] Phone: [phone]   Fax:  [phone]  Physical Therapy Treatment  Patient Details  Name: [name] MRN: [id number] Date of Birth: [birth year] Referring Provider (PT): Doran Clay, MD - referring physician.  Patient to have care transferred to [facility]  New PCP: Kerin Perna, NP   Encounter Date: 02/17/2019  PT End of Session - 02/17/19 0938    Visit Number  1    Number of Visits  6    Date for PT Re-Evaluation  03/30/19    Authorization Type  Medicaid    Authorization Time Period  6 visits: 02/17/19-03/30/19    Authorization - Visit Number  1    Authorization - Number of Visits  6    PT Start Time  0935    PT Stop Time  1020    PT Time Calculation (min)  45 min    Equipment Utilized During Treatment  Other (comment)   interpreter services utilized   Activity Tolerance  Patient tolerated treatment well    Behavior During Therapy  [facility] for tasks assessed/performed       Past Medical History:  Diagnosis Date  . Diabetes mellitus without complication ([place])   . History of cervical fracture     Past Surgical History:  Procedure Laterality Date  . CERVICAL SPINE SURGERY      There were no vitals filed for this visit.  Subjective Assessment - 02/17/19 0937    Subjective  Pt's brother was able to get a Lucianne Lei with a ramp and used it today to come to PT. Shoulders are feeling better since injections. Sometimes has neck pain when it's colder but moving well. He would like to try the sci fit stepper.    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    Currently in Pain?   No/denies                       [facility] Adult PT Treatment/Exercise - 02/17/19 1545      Bed Mobility   Bed Mobility  Rolling Right;Rolling Left;Right Sidelying to Sit;Left Sidelying to Sit;Sit to Sidelying Right;Sit to Sidelying Left    Rolling Right  Moderate Assistance - Patient 50-74%    Rolling Left  Moderate Assistance - Patient 50-74%    Right Sidelying to Sit  Total Assistance - Patient < 25%   see TA   Left Sidelying to Sit  Maximal Assistance - Patient 25-49%   see TA   Sit to Sidelying Right  Maximal Assistance - Patient 25-49%    Sit to Sidelying Left  Maximal Assistance - Patient 25-49%      Transfers   Transfers  Lateral/Scoot Transfers    Lateral/Scoot Transfers  3: Mod assist;4: Min assist    Lateral/Scoot Transfer Details (indicate cue type and reason)  Cues for positioning, technique & wt shift. Therpist assist to block bilateral LE to prevent sliding out of w/c when wt shifting fwd. Intermittent need for assist at pelvis for lateral wt shift. Pt demonstrated greater initiation and faciliation of lateral wt shifting. Requires multiple attempts.  Therapeutic Activites    Therapeutic Activities  Other Therapeutic Activities    Other Therapeutic Activities  Initiated c-crawling from supine position to inc pt's functional independence with supine > sit transfers. Total assist required d/t new task and significant weakness. Extensive cueing for positioning and sequencing. Therapist assist at shoulders to stabilize shoulder on side pt is proped up on elbow. Use of yoga block for superior UE to push through to dec wt elbow that pt was propped up on to move forward. Assist to keep UE on yoga block to provide stability in extension. Trialed c-crawl to L and to R, with greater ease on R requiring less time and assistance from therapist.                PT Short Term Goals - 02/09/19 2007      PT SHORT TERM GOAL #1   Title  STG=LTGs        PT Long  Term Goals - 02/17/19 1602      PT LONG TERM GOAL #1   Title  Patient will direct final HEP independently to wife/caregivers for stretching LEs in order to prevent risk of contractures. ALL LTGS DUE BY 03/30/2019.    Baseline  dependent with caregivers starting to perform stretches per patient.    Time  6    Period  Weeks   visits   Status  On-going    Target Date  03/30/19      PT LONG TERM GOAL #2   Title  When pt receives his own w/c (vs. loaner chair) pt will demonstrate independence with w/c parts in order to safely prepare for transfers.    Baseline  Not met to date - pt's wheelchair is still awaiting insurance approval.  Pt is managing arm rests, seat belt and seat functions of loaner w/c    Time  6    Period  Weeks    Status  On-going    Target Date  03/30/19      PT LONG TERM GOAL #3   Title  Patient will perform long sit without UE support >/= 5 minutes with Supervision only for improved ability to assist with ADLs in bed.    Baseline  02/09/19: can remain seated for 6 minutes with intermittent UE support and CGA for safety    Time  6    Period  Weeks    Status  New    Target Date  03/30/19      PT LONG TERM GOAL #4   Title  Patient will perform rolling right and left in bed with min A in order to decrease caregiver burden and for pressure relief.    Baseline  02/09/19: modA for LE placement and initiating weight shift    Time  6    Period  Weeks    Status  On-going    Target Date  03/30/19      PT LONG TERM GOAL #5   Title  Patient will perform lateral scoot transfer on sliding board from w/c <> mat table on unlevel surface with minA/mod assist (not including placement of board)    Baseline  02/01/19: min A today on level surface (02/01/19)    Time  6    Period  Weeks    Status  Revised    Target Date  03/30/19            Plan - 02/17/19 7048    Clinical Impression Statement  Focused on c-crawling transfer  today to increase pt's functional independence at  home. Max-TotalA for sequencing, wt shift, UE advancement when on elbow, and stability. Extensive cueing for technique required. Pt facilitated c-crawl with greater ease to R requiring less time, cueing, and assist from therapist. Will continue to work on functional independence and progress as indicated. Pt can benefit from continue skilled therapy services to address deficits.    PT Frequency  1x / week    PT Duration  6 weeks    PT Treatment/Interventions  ADLs/Self Care Home Management;DME Instruction;Functional mobility training;Therapeutic activities;Therapeutic exercise;Balance training;Neuromuscular re-education;Wheelchair mobility training;Patient/family education;Passive range of motion;Electrical Stimulation;Orthotic Fit/Training;Manual techniques;Dry needling;Energy conservation    PT Next Visit Plan  Start out with Sci-fit bike per pt request! Updates on the bed - does he have a regular bed he could transfer on to just to work on long sitting, rolling, etc as part of HEP - update HEP?  Slideboard transfers progress from level to uneven transfers.  Bed mobility: rolling with UE momentum; pushing up to sitting - long sitting, circle sitting wiht dynamic reaching. Short Sitting balance A/P and M/L, range of motion hips and shoulders, UE strengthening.  Management of LE during transfers and bed mobility. Sitting balance without UE support & dynamic reaching.    Recommended Other Services  [employer] - still waiting on insurance approval of new wheelchair    Consulted and Agree with Plan of Care  Patient;Family member/caregiver       Patient will benefit from skilled therapeutic intervention in order to improve the following deficits and impairments:  Decreased activity tolerance, Decreased balance, Decreased mobility, Decreased endurance, Decreased range of motion, Decreased strength, Impaired flexibility, Impaired sensation, Impaired UE functional use, Pain, Impaired tone, Postural  dysfunction  Visit Diagnosis: Other symptoms and signs involving the nervous system  Other disturbances of skin sensation  Muscle weakness (generalized)  Abnormal posture  Cervicalgia  Quadriplegia, C1-C4 incomplete ([place])     Problem List Patient Active Problem List   Diagnosis Date Noted  . 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 Neurogenic bowel 01/22/2019  . Neurogenic bladder 01/22/2019  . Myofascial pain 01/22/2019  . Spinal cord injury at T1-T6 level ([place]) 12/30/2018  . Quadriplegia ([place]) 12/30/2018    Juliann Pulse SPT 02/17/2019, 4:02 PM  [facility] [address] [place], [place], [address] Phone: [phone]   Fax:  [phone]  Name: [name] MRN: [id number] Date of Birth: [birth year]

## 2019-02-17 NOTE — Therapy (Signed)
[facility] [address] [place], [place], [address] Phone: [phone]   Fax:  [phone]  Occupational Therapy Treatment  Patient Details  Name: [name] MRN: [id number] Date of Birth: 1965-06-23 Referring Provider (OT): Dr. Alois Cliche   Encounter Date: 02/17/2019  OT End of Session - 02/17/19 1217    Visit Number  13    Number of Visits  15    Date for OT Re-Evaluation  03/09/19    Authorization Time Period  12 visits  through 01/27/2019, 2 visits approved through 02/10/2019 - then approved for 3 visits by 03/09/2019    Authorization - Visit Number  13    Authorization - Number of Visits  15    OT Start Time  [phone]   pt arrived late   OT Stop Time  0930    OT Time Calculation (min)  38 min    Activity Tolerance  Patient tolerated treatment well       Past Medical History:  Diagnosis Date  . Diabetes mellitus without complication (HCC)   . History of cervical fracture     Past Surgical History:  Procedure Laterality Date  . CERVICAL SPINE SURGERY      There were no vitals filed for this visit.  Subjective Assessment - 02/17/19 0854    Subjective   This was a good session - lots of great information    Patient is accompanied by:  Family member;Interpreter   wife ,   Pertinent History  1 burst fx with [place] transection s/p C4-T6 spinal fusion (6/10), incomplete para deficits and s/p distal radius fx. Pt was d/c home with family on 09/24/2018.  Pt's PMH is significant for: T2DM, recent LE DVT    Limitations  cleared for AA/ROM, P/ROM and strengthening to RUE, (pt was originally NWB, will plan to perform WB to tolerance as pt is now greater than 12 weeks post injury and cleared for strengthening.    Patient Stated Goals  get better    Currently in Pain?  Yes    Pain Score  6     Pain Location  Neck    Pain Orientation  Mid   the nape of the neck   Pain Descriptors / Indicators  Aching;Sore    Pain Type   Chronic pain    Pain Onset  More than a month ago    Pain Frequency  Intermittent    Aggravating Factors   I think the cold aggravates it because of the metal in my neck    Pain Relieving Factors  getting warm, moving my arms around                   OT Treatments/Exercises (OP) - 02/17/19 0001      ADLs   ADL Comments  Treatment focused on reviewing and educating pt and wife , via use of interpeter thru [employer], regarding Voc Rehab, Indepedent Living and Race to Walk.  Pt able to verbalize understanding. Also discussed possible return to work and possible return to driving and provided written resources. Pt's son to assist with contacting [employer] and completing applications.  Pt verbalized appreciation for the information.  Also assessed UE ROM for overhead reach L= 150* and R = 138* without pain. Pt with signficant improvement since evaluation.              OT Education - 02/17/19 1215    Education Details  Voc Rehab, Indepedent Living, Race to Walk,  return to driving    Person(s) Educated  Patient;Spouse    Methods  Explanation;Handout    Comprehension  Verbalized understanding       OT Short Term Goals - 02/01/19 1232      OT SHORT TERM GOAL #1   Title  I with inital HEP- 12/14/2018    Baseline  dependent    Time  6    Period  Weeks    Status  Achieved    Target Date  12/14/18      OT SHORT TERM GOAL #2   Title  Pt will donn shirt with min A    Baseline  mod A    Time  6    Period  Weeks    Status  Achieved   in wheelchair     OT SHORT TERM GOAL #3   Title  Pt will demonstrate bilateral wrist flexion/ extension WFLs for ADLs/ IADLS.    Baseline  45/45 wrist flexion/ extension bilateral UE's    Time  6    Period  Weeks    Status  Achieved   12/16/2018 LUE =58* and RUE = 60* however pt still with pain end ROM for R wrist     OT SHORT TERM GOAL #4   Title  Pt will perform LB dressing with mod A - 03/15/2019    Baseline  dependent    Time  6     Period  Weeks    Status  On-going   pt is awaiting new hospital bed     OT SHORT TERM GOAL #5   Title  Pt will perform UB bathing with Min A, and LB bathing with mod A    Baseline  UB mod A, LB total A in bed    Time  6    Period  Weeks    Status  Achieved   12/16/2018 in long sitting in clinic; pt's hospital bed is poor and does not allow pt to complete at home. Attempting to contact [employer] to determine if more suitable hospital bed can be issued. WIfe to call clinic with rep name     OT SHORT TERM GOAL #6   Title  Pt will demonstrate ability to retireve a lightweight object at 110 shoulder flexion for RUE for increased ease with ADLs.    Baseline  95* shoulder flexion    Time  6    Period  Weeks    Status  Achieved      OT SHORT TERM GOAL #7   Title  Pt will demonstrate ability to retrieve a lightweight object at 115* shoulder flexion  with LUE for increased ease with ADLs,    Baseline  105*    Time  6    Period  Weeks    Status  Achieved        OT Long Term Goals - 02/01/19 1233      OT LONG TERM GOAL #1   Title  Pt will donn shirt with setup-01/28/2019    Baseline  mod A    Time  12    Period  Weeks    Status  Achieved      OT LONG TERM GOAL #2   Title  Pt will perform LB dressing with min A - 04/26/2019    Baseline  dependent/ total A    Time  12    Period  Weeks    Status  On-going   pt is awaitng new hospital  bed     OT LONG TERM GOAL #3   Title  Pt will transfer to [facility] with mod A using sliding board - 04/26/2019    Baseline  total A +2 for basic transfers    Time  12    Period  Weeks    Status  On-going      OT LONG TERM GOAL #4   Title  Pt and family will verbalize understanding regarding recommendations for shower chair, DME    Baseline  dependent, pt does not yet have shower chair, currently bathing in bed    Time  12    Period  Weeks    Status  Achieved      OT LONG TERM GOAL #5   Title  Pt will increase bilateral grip strength by 10 lbs for  increased ease with ADLs/ IADLS.    Baseline  RUE grip 5 lbs, LUE 3 lbs    Time  12    Period  Weeks    Status  Achieved   01/04/2019 R = 26 pounds, L = 3 pounds fro combined bilateral grip strength of 29 pounds (baseline combined 8 pounds)           Plan - 02/17/19 1215    Clinical Impression Statement  Pt progressing toward OT goals. Pt has been approved for 3 OT visits over first month of 2021    OT Occupational Profile and History  Detailed Assessment- Review of Records and additional review of physical, cognitive, psychosocial history related to current functional performance    Occupational performance deficits (Please refer to evaluation for details):  ADL's;IADL's;Work;Leisure;Social Participation    Body Structure / Function / Physical Skills  ADL;UE functional use;Endurance;Balance;Flexibility;Pain;FMC;ROM;GMC;Coordination;Sensation;Decreased knowledge of precautions;Decreased knowledge of use of DME;IADL;Strength;Dexterity;Mobility;Tone    Rehab Potential  Good    Clinical Decision Making  Several treatment options, min-mod task modification necessary    Comorbidities Affecting Occupational Performance:  May have comorbidities impacting occupational performance    Modification or Assistance to Complete Evaluation   No modification of tasks or assist necessary to complete eval    OT Frequency  1x / week    OT Duration  Other (comment)   3 weeks   OT Treatment/Interventions  Self-care/ADL training;Ultrasound;Energy conservation;Aquatic Therapy;DME and/or AE instruction;Patient/family education;Paraffin;Gait Training;Passive range of motion;Balance training;Fluidtherapy;Cryotherapy;Splinting;Therapist, nutritional;Contrast Bath;Electrical Stimulation;Moist Heat;Therapeutic exercise;Manual Therapy;Therapeutic activities;Cognitive remediation/compensation;Neuromuscular education    Plan  functional reaching, functional mobility, balance, ADL's,upgrade HEP for UE's as possible     Consulted and Agree with Plan of Care  Patient;Family member/caregiver    Family Member Consulted  wife,interpreter       Patient will benefit from skilled therapeutic intervention in order to improve the following deficits and impairments:   Body Structure / Function / Physical Skills: ADL, UE functional use, Endurance, Balance, Flexibility, Pain, FMC, ROM, GMC, Coordination, Sensation, Decreased knowledge of precautions, Decreased knowledge of use of DME, IADL, Strength, Dexterity, Mobility, Tone       Visit Diagnosis: Other symptoms and signs involving the nervous system  Other disturbances of skin sensation  Muscle weakness (generalized)  Abnormal posture    Problem List Patient Active Problem List   Diagnosis Date Noted  . 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 Neurogenic bowel 01/22/2019  . Neurogenic bladder 01/22/2019  . Myofascial pain 01/22/2019  . Spinal cord injury at T1-T6 level ([place]) 12/30/2018  . Quadriplegia ([place]) 12/30/2018    Quay Burow, OTR/L 02/17/2019, 12:20 PM  [facility] [address] [place], [place], [address] Phone: [phone]   Fax:  [phone]  Name: Yazen Rosko MRN: [id number] Date of Birth: 12/19/1965

## 2019-02-22 ENCOUNTER — Ambulatory Visit: Payer: Medicaid Other

## 2019-02-22 ENCOUNTER — Other Ambulatory Visit: Payer: Self-pay

## 2019-02-22 DIAGNOSIS — R29818 Other symptoms and signs involving the nervous system: Secondary | ICD-10-CM | POA: Diagnosis not present

## 2019-02-22 DIAGNOSIS — M6281 Muscle weakness (generalized): Secondary | ICD-10-CM

## 2019-02-22 NOTE — Therapy (Addendum)
[facility] [address] [place], [place], [address] Phone: [phone]   Fax:  [phone]  Physical Therapy Treatment  Patient Details  Name: [name] MRN: [id number] Date of Birth: 01/15/1966 Referring Provider (PT): Patrick O'Brien, MD - referring physician.  Patient to have care transferred to [place].  New PCP: Michelle P Edwards, NP   Encounter Date: 02/22/2019  PT End of Session - 02/22/19 0941    Visit Number  2   Number of Visits  6 (new total visit count for 2021)   Date for PT Re-Evaluation  03/30/19    Authorization Type  Medicaid    Authorization Time Period  6 visits: 02/17/19-03/30/19    Authorization - Visit Number  2    Authorization - Number of Visits  6    PT Start Time  0846    PT Stop Time  0935    PT Time Calculation (min)  49 min    Equipment Utilized During Treatment  Other (comment)   interpreter services utilized   Activity Tolerance  Patient tolerated treatment well    Behavior During Therapy  WFL for tasks assessed/performed       Past Medical History:  Diagnosis Date  . Diabetes mellitus without complication (HCC)   . History of cervical fracture     Past Surgical History:  Procedure Laterality Date  . CERVICAL SPINE SURGERY      There were no vitals filed for this visit.  Subjective Assessment - 02/22/19 1006    Subjective  Pt had no new reports. Not sure if has another bed that he could try the stretching and rolling in at home.    Patient is accompained by:  Family member    Pertinent History  C1 burst fx with [place] transection s/p C4-T6 spinal fusion (6/10), incomplete quadriplegia, previous episodes of autonomic dysreflexia in inpatient rehab    Patient Stated Goals  wants to do exercises as soon as he can, wants to be able to feel his legs again if he can.    Currently in Pain?  No/denies                       [facility] Adult PT Treatment/Exercise -  02/22/19 0001      Bed Mobility   Bed Mobility  Rolling Right;Rolling Left;Sit to Sidelying Right;Sitting - Scoot to Edge of Bed    Rolling Right  Moderate Assistance - Patient 50-74%    Rolling Left  Moderate Assistance - Patient 50-74%    Supine to Sit  Total Assistance - Patient < 25%   supine to long sit   Sit to Sidelying Right  Maximal Assistance - Patient 25-49%   at legs with 2nd person for safety spotting trunk     Transfers   Transfers  Lateral/Scoot Transfers    Lateral/Scoot Transfers  2: Max assist;With slide board;With armrests removed    Lateral/Scoot Transfer Details (indicate cue type and reason)  PT assisted to position board with patient leaning away. PT cued patient to lean forward and have  head going opposite bottom. PT blocked knees from coming forward and made sure feet stayed positioned well on footplate/step. Pt utilized multiple pushes to get across board. 2nd person behind for safety.      Therapeutic Activites    Therapeutic Activities  Other Therapeutic Activities    Other Therapeutic Activities  Seated edge of mat: leaning to side with cues to keep weight   forward coming down on forearm on yoga block x 3 each side then pushing back up CGA. Repeated coming down on forearm x 3 each side. With rolling PT assisted to bend opposite leg up and assisted at pelvis with patient using momentum swinging arms to help roll. Sidelying pushing through yoga block with top UE and trying to push with same side elbow to initiate coming up. Pt only able to initiate movement and not get elbow under shoulder all the way. Performed x 3 reps each side. Supine to long sit attempted with patient pulling up on therapist arms but unable to hold to get up. Second person assisted at trunk behind. Pt sat in long sit CGA/mod assist. Pt reported feeling like he was leaning more to the right if tried to sit up. Performed coming down on forearm on yoga block to each side x 3 CGA/min assist. Verbal cues  to try to keep weight forward more. To scoot long sit to edge of mat max assist +2 to turn patient and then patient assisted to pull to edge of mat mod assist.      Exercises   Other Exercises   Scit Fit x 6 min UE only level 4.2. Pt reported feeling like a good work out and arms felt looser after.  Pt wanting to put legs up but PT explained there were no straps for legs on this machine and that arms would be doing all the work.             PT Education - 02/22/19 1008    Education Details  Pt to continue with current HEP    Person(s) Educated  Patient    Methods  Explanation    Comprehension  Verbalized understanding       PT Short Term Goals - 02/09/19 2007      PT SHORT TERM GOAL #1   Title  STG=LTGs        PT Long Term Goals - 02/17/19 1602      PT LONG TERM GOAL #1   Title  Patient will direct final HEP independently to wife/caregivers for stretching LEs in order to prevent risk of contractures. ALL LTGS DUE BY 03/30/2019.    Baseline  dependent with caregivers starting to perform stretches per patient.    Time  6    Period  Weeks   visits   Status  On-going    Target Date  03/30/19      PT LONG TERM GOAL #2   Title  When pt receives his own w/c (vs. loaner chair) pt will demonstrate independence with w/c parts in order to safely prepare for transfers.    Baseline  Not met to date - pt's wheelchair is still awaiting insurance approval.  Pt is managing arm rests, seat belt and seat functions of loaner w/c    Time  6    Period  Weeks    Status  On-going    Target Date  03/30/19      PT LONG TERM GOAL #3   Title  Patient will perform long sit without UE support >/= 5 minutes with Supervision only for improved ability to assist with ADLs in bed.    Baseline  02/09/19: can remain seated for 6 minutes with intermittent UE support and CGA for safety    Time  6    Period  Weeks    Status  New    Target Date  03/30/19      PT  LONG TERM GOAL #4   Title  Patient will  perform rolling right and left in bed with min A in order to decrease caregiver burden and for pressure relief.    Baseline  02/09/19: modA for LE placement and initiating weight shift    Time  6    Period  Weeks    Status  On-going    Target Date  03/30/19      PT LONG TERM GOAL #5   Title  Patient will perform lateral scoot transfer on sliding board from w/c <> mat table on unlevel surface with minA/mod assist (not including placement of board)    Baseline  02/01/19: min A today on level surface (02/01/19)    Time  6    Period  Weeks    Status  Revised    Target Date  03/30/19            Plan - 02/22/19 1008    Clinical Impression Statement  Pt felt that Sci Fit gave him a good work out  for arms but PT did note that after performing he needed increased assisted with functional mobility. Sci Fit may have contributed to fatigue throughout session.    PT Frequency  1x / week    PT Duration  6 weeks    PT Treatment/Interventions  ADLs/Self Care Home Management;DME Instruction;Functional mobility training;Therapeutic activities;Therapeutic exercise;Balance training;Neuromuscular re-education;Wheelchair mobility training;Patient/family education;Passive range of motion;Electrical Stimulation;Orthotic Fit/Training;Manual techniques;Dry needling;Energy conservation    PT Next Visit Plan  May want to do Sci-Fit at end to see if helps with tolerating functional mobility training better if patient wants to continue. Updates on the bed - does he have a regular bed he could transfer on to just to work on long sitting, rolling, etc as part of HEP - update HEP?  Slideboard transfers progress from level to uneven transfers.  Bed mobility: rolling with UE momentum; pushing up to sitting - long sitting, circle sitting wiht dynamic reaching. Short Sitting balance A/P and M/L, range of motion hips and shoulders, UE strengthening.  Management of LE during transfers and bed mobility. Sitting balance without  UE support & dynamic reaching.    Consulted and Agree with Plan of Care  Patient;Family member/caregiver       Patient will benefit from skilled therapeutic intervention in order to improve the following deficits and impairments:  Decreased activity tolerance, Decreased balance, Decreased mobility, Decreased endurance, Decreased range of motion, Decreased strength, Impaired flexibility, Impaired sensation, Impaired UE functional use, Pain, Impaired tone, Postural dysfunction  Visit Diagnosis: Muscle weakness (generalized)     Problem List Patient Active Problem List   Diagnosis Date Noted  . 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 Neurogenic bowel 01/22/2019  . Neurogenic bladder 01/22/2019  . Myofascial pain 01/22/2019  . 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 Quadriplegia (HCC) 12/30/2018    Emily A Parker, PT, DPT, NCS 02/22/2019, 10:11 AM   [facility] [address] [place], [place], [address] Phone: [phone]   Fax:  [phone]  Name: [name] MRN: [id number] Date of Birth: 08/21/1965   

## 2019-02-23 ENCOUNTER — Telehealth (INDEPENDENT_AMBULATORY_CARE_PROVIDER_SITE_OTHER): Payer: Self-pay

## 2019-02-23 ENCOUNTER — Other Ambulatory Visit (INDEPENDENT_AMBULATORY_CARE_PROVIDER_SITE_OTHER): Payer: Self-pay | Admitting: Primary Care

## 2019-02-23 MED ORDER — FLUOXETINE HCL 10 MG PO CAPS: 10.0000 mg | ORAL_CAPSULE | Freq: Every day | ORAL | 1 refills | Status: DC

## 2019-02-23 NOTE — Telephone Encounter (Signed)
Patient called to make a medication refill for FLUoxetine (PROZAC) 10 MG capsule   Patient uses  [facility] [place], [place] - [address]  [address], [place] [address]   Please advice [phone]

## 2019-02-23 NOTE — Telephone Encounter (Signed)
FWD to PCP

## 2019-02-24 ENCOUNTER — Other Ambulatory Visit: Payer: Self-pay | Admitting: Neurology

## 2019-02-24 ENCOUNTER — Ambulatory Visit: Payer: Medicaid Other | Admitting: Occupational Therapy

## 2019-02-25 ENCOUNTER — Telehealth: Payer: Self-pay | Admitting: *Deleted

## 2019-02-25 ENCOUNTER — Other Ambulatory Visit: Payer: Self-pay | Admitting: *Deleted

## 2019-02-25 MED ORDER — OXYCODONE HCL 5 MG PO TABS: ORAL_TABLET | ORAL | 0 refills | Status: DC

## 2019-02-25 NOTE — Telephone Encounter (Signed)
In Dr. Zannie Cove absence, Dr. Epimenio Foot refilled patient's oxycodone 5mg  rx for #90.  The pharmacy was only able to provide him with #7 tablets due to the medication needing a PA.  The remainder of the prescription was voided.  PA initiated on [employer] [employer] and is pending.  Dr. authorized a second rx for the other #83 tablets to fill once the PA is approved.

## 2019-02-26 ENCOUNTER — Telehealth: Payer: Self-pay | Admitting: Licensed Clinical Social Worker

## 2019-02-26 NOTE — Telephone Encounter (Signed)
MSW Intern placed call to patient to introduce role at clinic and inquire about any behavioral health or resource needs. MSW Intern used [place] interpreters [place], [id number], [place], [place] #[id number], and [place], [employer]#[id number]. MSW Intern spoke with patient's wife who stated that patient was having difficulty obtaining fluoxetine prescription from pharmacy. In addition, patient's wife was unable to reach a caseworker at [employer] who speaks spanish/was not provided a spanish option when calling.   MSW Intern called pharmacist at [facility]. Patient's prescription for fluoxetine will be ready at noon today. MSW Intern also provided patient's wife with a phone number for [employer] with a spanish speaking option. ([phone])  MSW Intern encouraged patient and/or patient's wife to contact this MSW Intern or Jenel Lucks, LCSW if any additional support is needed.

## 2019-02-26 NOTE — Telephone Encounter (Signed)
PA approved through [employer].  Pt [id number].  FU#[id number].  Valid through 09/11/2019.

## 2019-03-01 ENCOUNTER — Ambulatory Visit: Payer: Medicaid Other

## 2019-03-01 ENCOUNTER — Other Ambulatory Visit: Payer: Self-pay

## 2019-03-01 DIAGNOSIS — M6281 Muscle weakness (generalized): Secondary | ICD-10-CM

## 2019-03-01 DIAGNOSIS — R29818 Other symptoms and signs involving the nervous system: Secondary | ICD-10-CM | POA: Diagnosis not present

## 2019-03-01 NOTE — Therapy (Addendum)
[facility] [address] [place], [place], [address] Phone: [phone]   Fax:  [phone]  Physical Therapy Treatment  Patient Details  Name: Quantae Martel MRN: [id number] Date of Birth: Aug 11, 1965 Referring Provider (PT): Doran Clay, MD - referring physician.  Patient to have care transferred to [facility].  New PCP: Kerin Perna, NP   Encounter Date: 03/01/2019  PT End of Session - 03/01/19 0937    Visit Number  3   Number of Visits  6 (new total visit count for 2021)   Date for PT Re-Evaluation  03/30/19    Authorization Type  Medicaid    Authorization Time Period  6 visits: 02/17/19-03/30/19    Authorization - Visit Number  3    Authorization - Number of Visits  6    PT Start Time  0935    PT Stop Time  1018    PT Time Calculation (min)  43 min    Equipment Utilized During Treatment  Other (comment);Gait belt   interpreter services utilized   Activity Tolerance  Patient tolerated treatment well    Behavior During Therapy  WFL for tasks assessed/performed       Past Medical History:  Diagnosis Date  . Diabetes mellitus without complication ([place])   . History of cervical fracture     Past Surgical History:  Procedure Laterality Date  . CERVICAL SPINE SURGERY      There were no vitals filed for this visit.  Subjective Assessment - 03/01/19 1035    Subjective  Pt reports that he is doing well.    Patient is accompained by:  Family member    Pertinent History  C1 burst fx with [place] transection s/p C4-T6 spinal fusion (6/10), incomplete quadriplegia, previous episodes of autonomic dysreflexia in inpatient rehab    Patient Stated Goals  wants to do exercises as soon as he can, wants to be able to feel his legs again if he can.    Currently in Pain?  No/denies                       [facility] Adult PT Treatment/Exercise - 03/01/19 1019      Bed Mobility   Bed Mobility  Rolling Left;Sit  to Supine    Rolling Left  Moderate Assistance - Patient 50-74%    Sit to Sidelying Right  Maximal Assistance - Patient 25-49%   Total assist to place legs on mat, pt lowered trunk     Transfers   Transfers  Lateral/Scoot Transfers    Lateral/Scoot Transfers  3: Mod assist    Lateral Transfers: Patient Percentage  50%    Lateral/Scoot Transfer Details (indicate cue type and reason)  Pt performed slideboard transfer with PT blocking legs and helping to repositioin feet mod/min assist. When going powerchair to mat min assist with scooting portion but once got off slideboard was mas assist. 2nd person behing for safety. With mat to powerchair pt able to help direct transfer some. Was reminded by PT to keep weight forward and that head goes opposite bottom. Min assist +2 with PT blocking legs and 2nd person behind with min assist to scoot.      Therapeutic Activites    Therapeutic Activities  Other Therapeutic Activities    Other Therapeutic Activities  Pt rolled to left side mod assist at trunk with pt swinging arms across body to help get momentum. Supine to long sit with patient pulling on therapist  arms and 2nd person behind to lift with max/total assist. Pt initially mod assist to maintain long sit but after stretching for about 5 min pt and to get more anterior trunk lean and maintain supervision/CGA. In long sit: coming down to side on forearm and pushing back up x 5 each side CGA/min assist. Verbal cues to lean forward. Pt lost balance a couple times posterior when did not keep weight forward enough. Pt had decreased range going down to right reporting numbness in right arm makes more difficult. Long sit reaching across body for his shoes that were on opposite side moving each shoe across legs x 3 bouts CGA/min assist. Pt with more difficulty with trunk rotation to right. Pt attempted to help scoot legs to side of mat with PT providing max assist to move legs. Pt performed scooting forward to edge  of mat min assist with multiple scoots.              PT Education - 03/01/19 1034    Education Details  Pt reports that he does have queen sized bed at home. PT suggested using that bed to work on long sitting with stretches as may be easier with more room.    Person(s) Educated  Patient    Methods  Explanation    Comprehension  Verbalized understanding       PT Short Term Goals - 02/09/19 2007      PT SHORT TERM GOAL #1   Title  STG=LTGs        PT Long Term Goals - 02/17/19 1602      PT LONG TERM GOAL #1   Title  Patient will direct final HEP independently to wife/caregivers for stretching LEs in order to prevent risk of contractures. ALL LTGS DUE BY 03/30/2019.    Baseline  dependent with caregivers starting to perform stretches per patient.    Time  6    Period  Weeks   visits   Status  On-going    Target Date  03/30/19      PT LONG TERM GOAL #2   Title  When pt receives his own w/c (vs. loaner chair) pt will demonstrate independence with w/c parts in order to safely prepare for transfers.    Baseline  Not met to date - pt's wheelchair is still awaiting insurance approval.  Pt is managing arm rests, seat belt and seat functions of loaner w/c    Time  6    Period  Weeks    Status  On-going    Target Date  03/30/19      PT LONG TERM GOAL #3   Title  Patient will perform long sit without UE support >/= 5 minutes with Supervision only for improved ability to assist with ADLs in bed.    Baseline  02/09/19: can remain seated for 6 minutes with intermittent UE support and CGA for safety    Time  6    Period  Weeks    Status  New    Target Date  03/30/19      PT LONG TERM GOAL #4   Title  Patient will perform rolling right and left in bed with min A in order to decrease caregiver burden and for pressure relief.    Baseline  02/09/19: modA for LE placement and initiating weight shift    Time  6    Period  Weeks    Status  On-going    Target Date  03/30/19  PT  LONG TERM GOAL #5   Title  Patient will perform lateral scoot transfer on sliding board from w/c <> mat table on unlevel surface with minA/mod assist (not including placement of board)    Baseline  02/01/19: min A today on level surface (02/01/19)    Time  6    Period  Weeks    Status  Revised    Target Date  03/30/19            Plan - 03/01/19 1035    Clinical Impression Statement  Pt able to assist more with slideboard transfer today with PT focusing most on blocking legs to prevent coming forward while on board. Pt able to show improvement in long sit after sitting to stretch out first. Easier for him to rotate and some down on left side due to numbness/more weakness in right arm.    PT Frequency  1x / week    PT Duration  6 weeks    PT Treatment/Interventions  ADLs/Self Care Home Management;DME Instruction;Functional mobility training;Therapeutic activities;Therapeutic exercise;Balance training;Neuromuscular re-education;Wheelchair mobility training;Patient/family education;Passive range of motion;Electrical Stimulation;Orthotic Fit/Training;Manual techniques;Dry needling;Energy conservation    PT Next Visit Plan  Did he try stretches in queen bed at home.May want to do Sci-Fit at end to see if helps with tolerating functional mobility training better if patient wants to continue. Slideboard transfers progress from level to uneven transfers.  Bed mobility: rolling with UE momentum; pushing up to sitting - long sitting, circle sitting wiht dynamic reaching. Short Sitting balance A/P and M/L, range of motion hips and shoulders, UE strengthening.  Management of LE during transfers and bed mobility. Sitting balance without UE support & dynamic reaching.    Consulted and Agree with Plan of Care  Patient;Family member/caregiver       Patient will benefit from skilled therapeutic intervention in order to improve the following deficits and impairments:  Decreased activity tolerance, Decreased  balance, Decreased mobility, Decreased endurance, Decreased range of motion, Decreased strength, Impaired flexibility, Impaired sensation, Impaired UE functional use, Pain, Impaired tone, Postural dysfunction  Visit Diagnosis: Muscle weakness (generalized)     Problem List Patient Active Problem List   Diagnosis Date Noted  . 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 Neurogenic bowel 01/22/2019  . Neurogenic bladder 01/22/2019  . Myofascial pain 01/22/2019  . Spinal cord injury at T1-T6 level ([place]) 12/30/2018  . Quadriplegia ([place]) 12/30/2018    Electa Sniff, PT, DPT, NCS 03/01/2019, 10:38 AM  [facility] [address] [place], [place], [address] Phone: [phone]   Fax:  [phone]  Name: Addam Goeller MRN: [id number] Date of Birth: November 15, 1965

## 2019-03-03 ENCOUNTER — Encounter: Payer: Self-pay | Admitting: Occupational Therapy

## 2019-03-03 ENCOUNTER — Other Ambulatory Visit: Payer: Self-pay

## 2019-03-03 ENCOUNTER — Ambulatory Visit: Payer: Medicaid Other | Admitting: Occupational Therapy

## 2019-03-03 DIAGNOSIS — R29818 Other symptoms and signs involving the nervous system: Secondary | ICD-10-CM

## 2019-03-03 DIAGNOSIS — M25631 Stiffness of right wrist, not elsewhere classified: Secondary | ICD-10-CM

## 2019-03-03 DIAGNOSIS — R208 Other disturbances of skin sensation: Secondary | ICD-10-CM

## 2019-03-03 DIAGNOSIS — M25612 Stiffness of left shoulder, not elsewhere classified: Secondary | ICD-10-CM

## 2019-03-03 DIAGNOSIS — M6281 Muscle weakness (generalized): Secondary | ICD-10-CM

## 2019-03-03 DIAGNOSIS — M25611 Stiffness of right shoulder, not elsewhere classified: Secondary | ICD-10-CM

## 2019-03-03 DIAGNOSIS — M25632 Stiffness of left wrist, not elsewhere classified: Secondary | ICD-10-CM

## 2019-03-03 DIAGNOSIS — R293 Abnormal posture: Secondary | ICD-10-CM

## 2019-03-03 NOTE — Therapy (Signed)
[facility] [address] [place], [place], [address] Phone: [phone]   Fax:  [phone]  Occupational Therapy Treatment  Patient Details  Name: [name] MRN: [id number] Date of Birth: 1965/02/15 Referring Provider (OT): Dr. Alois Cliche   Encounter Date: 03/03/2019  OT End of Session - 03/03/19 1235    Visit Number  14    Date for OT Re-Evaluation  03/09/19    Authorization Type  Medicaid    Authorization Time Period  12 visits  through 01/27/2019, 2 visits approved through 02/10/2019 - then approved for 3 visits by 03/09/2019    Authorization - Visit Number  14    Authorization - Number of Visits  15    OT Start Time  0933    OT Stop Time  1019    OT Time Calculation (min)  46 min    Activity Tolerance  Patient tolerated treatment well       Past Medical History:  Diagnosis Date  . Diabetes mellitus without complication ([facility])   . History of cervical fracture     Past Surgical History:  Procedure Laterality Date  . CERVICAL SPINE SURGERY      There were no vitals filed for this visit.  Subjective Assessment - 03/03/19 0939    Subjective   This was a good session - lots of great information    Patient is accompanied by:  Family member;Interpreter    Pertinent History  1 burst fx with [place] transection s/p C4-T6 spinal fusion (6/10), incomplete para deficits and s/p distal radius fx. Pt was d/c home with family on 09/24/2018.  Pt's PMH is significant for: T2DM, recent LE DVT    Limitations  cleared for AA/ROM, P/ROM and strengthening to RUE, (pt was originally NWB, will plan to perform WB to tolerance as pt is now greater than 12 weeks post injury and cleared for strengthening.    Currently in Pain?  Yes    Pain Score  5                    OT Treatments/Exercises (OP) - 03/03/19 0001      ADLs   Functional Mobility  Sliding board transfers chair to mat mod a.  Pt improving in functional  mobility.    ADL Comments  Addressed sitting to side sitting propped on elbow and back to sitting alternating sides. Pt able to complete 5 reps each side with rest breaks and cues to breath.  Transitioned from sitting EOM to sidelying to supine with mod a for legs.  Transitioned into long sitting with mod a x2 using BUE pull.  Addressed long sitting diagnonal stretches x10 and holding for 5 each time.  Transitioned back to supine and then into prone with min a x2.  Propped on elbows, worked on lateral weight shifting and lifting alternate arms and holding for cont of 3.  Transitioned back into supine and using 5 pound weight held in both hands, addressed chest presses and overhead bilateral reach x10 each.  Transitioned back into sitting and sliding board transfer back into wheelchair with mod a.  Pt improving in maintaining his balance with transfers, coming forward with transfers and scooting in all directions.                OT Short Term Goals - 03/03/19 1234      OT SHORT TERM GOAL #1   Title  I with inital HEP- 12/14/2018    Baseline  dependent    Time  6    Period  Weeks    Status  Achieved    Target Date  12/14/18      OT SHORT TERM GOAL #2   Title  Pt will donn shirt with min A    Baseline  mod A    Time  6    Period  Weeks    Status  Achieved   in wheelchair     OT SHORT TERM GOAL #3   Title  Pt will demonstrate bilateral wrist flexion/ extension WFLs for ADLs/ IADLS.    Baseline  45/45 wrist flexion/ extension bilateral UE's    Time  6    Period  Weeks    Status  Achieved   12/16/2018 LUE =58* and RUE = 60* however pt still with pain end ROM for R wrist     OT SHORT TERM GOAL #4   Title  Pt will perform LB dressing with mod A - 03/15/2019    Baseline  dependent    Time  6    Period  Weeks    Status  On-going   pt is awaiting new hospital bed     OT SHORT TERM GOAL #5   Title  Pt will perform UB bathing with Min A, and LB bathing with mod A    Baseline  UB mod  A, LB total A in bed    Time  6    Period  Weeks    Status  Achieved   12/16/2018 in long sitting in clinic; pt's hospital bed is poor and does not allow pt to complete at home. Attempting to contact [employer] to determine if more suitable hospital bed can be issued. WIfe to call clinic with rep name     OT SHORT TERM GOAL #6   Title  Pt will demonstrate ability to retireve a lightweight object at 110 shoulder flexion for RUE for increased ease with ADLs.    Baseline  95* shoulder flexion    Time  6    Period  Weeks    Status  Achieved      OT SHORT TERM GOAL #7   Title  Pt will demonstrate ability to retrieve a lightweight object at 115* shoulder flexion  with LUE for increased ease with ADLs,    Baseline  105*    Time  6    Period  Weeks    Status  Achieved        OT Long Term Goals - 02/01/19 1233      OT LONG TERM GOAL #1   Title  Pt will donn shirt with setup-01/28/2019    Baseline  mod A    Time  12    Period  Weeks    Status  Achieved      OT LONG TERM GOAL #2   Title  Pt will perform LB dressing with min A - 04/26/2019    Baseline  dependent/ total A    Time  12    Period  Weeks    Status  On-going   pt is awaitng new hospital bed     OT LONG TERM GOAL #3   Title  Pt will transfer to [facility] with mod A using sliding board - 04/26/2019    Baseline  total A +2 for basic transfers    Time  12    Period  Weeks    Status  On-going  OT LONG TERM GOAL #4   Title  Pt and family will verbalize understanding regarding recommendations for shower chair, DME    Baseline  dependent, pt does not yet have shower chair, currently bathing in bed    Time  12    Period  Weeks    Status  Achieved      OT LONG TERM GOAL #5   Title  Pt will increase bilateral grip strength by 10 lbs for increased ease with ADLs/ IADLS.    Baseline  RUE grip 5 lbs, LUE 3 lbs    Time  12    Period  Weeks    Status  Achieved   01/04/2019 R = 26 pounds, L = 3 pounds fro combined bilateral grip  strength of 29 pounds (baseline combined 8 pounds)           Plan - 03/03/19 1234    Clinical Impression Statement  Pt continues to improve in basic functional mobiliy as well as BUE strength and ROM.    OT Occupational Profile and History  Detailed Assessment- Review of Records and additional review of physical, cognitive, psychosocial history related to current functional performance    Occupational performance deficits (Please refer to evaluation for details):  ADL's;IADL's;Work;Leisure;Social Participation    Body Structure / Function / Physical Skills  ADL;UE functional use;Endurance;Balance;Flexibility;Pain;FMC;ROM;GMC;Coordination;Sensation;Decreased knowledge of precautions;Decreased knowledge of use of DME;IADL;Strength;Dexterity;Mobility;Tone    Rehab Potential  Good    Clinical Decision Making  Several treatment options, min-mod task modification necessary    Comorbidities Affecting Occupational Performance:  May have comorbidities impacting occupational performance    Modification or Assistance to Complete Evaluation   No modification of tasks or assist necessary to complete eval    OT Frequency  1x / week    OT Duration  Other (comment)   3 weeks   OT Treatment/Interventions  Self-care/ADL training;Ultrasound;Energy conservation;Aquatic Therapy;DME and/or AE instruction;Patient/family education;Paraffin;Gait Training;Passive range of motion;Balance training;Fluidtherapy;Cryotherapy;Splinting;Building services engineer;Contrast Bath;Electrical Stimulation;Moist Heat;Therapeutic exercise;Manual Therapy;Therapeutic activities;Cognitive remediation/compensation;Neuromuscular education    Plan  functional reaching, functional mobility, balance, ADL's,upgrade HEP for UE's as possible    Consulted and Agree with Plan of Care  Patient;Family member/caregiver    Family Member Consulted  wife,interpreter       Patient will benefit from skilled therapeutic intervention in order to  improve the following deficits and impairments:   Body Structure / Function / Physical Skills: ADL, UE functional use, Endurance, Balance, Flexibility, Pain, FMC, ROM, GMC, Coordination, Sensation, Decreased knowledge of precautions, Decreased knowledge of use of DME, IADL, Strength, Dexterity, Mobility, Tone       Visit Diagnosis: Muscle weakness (generalized)  Other symptoms and signs involving the nervous system  Other disturbances of skin sensation  Abnormal posture  Stiffness of right shoulder, not elsewhere classified  Stiffness of left shoulder, not elsewhere classified  Stiffness of right wrist, not elsewhere classified  Stiffness of left wrist, not elsewhere classified    Problem List Patient Active Problem List   Diagnosis Date Noted  . 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 Neurogenic bowel 01/22/2019  . Neurogenic bladder 01/22/2019  . Myofascial pain 01/22/2019  . 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 Quadriplegia (HCC) 12/30/2018    Norton Pastel, OTR/L 03/03/2019, 12:36 PM   [facility] [address] [place], [place], [address] Phone: [phone]   Fax:  [phone]  Name: Kevin Space MRN: [id number] Date of Birth: 1965-09-09

## 2019-03-08 ENCOUNTER — Other Ambulatory Visit: Payer: Self-pay

## 2019-03-08 ENCOUNTER — Encounter
Payer: Medicaid Other | Attending: Physical Medicine and Rehabilitation | Admitting: Physical Medicine and Rehabilitation

## 2019-03-08 ENCOUNTER — Telehealth (INDEPENDENT_AMBULATORY_CARE_PROVIDER_SITE_OTHER): Payer: Self-pay

## 2019-03-08 ENCOUNTER — Encounter: Payer: Self-pay | Admitting: Physical Medicine and Rehabilitation

## 2019-03-08 VITALS — BP 111/73 | HR 62 | Temp 97.7°F | Ht 70.0 in

## 2019-03-08 DIAGNOSIS — S24101A Unspecified injury at T1 level of thoracic spinal cord, initial encounter: Secondary | ICD-10-CM | POA: Diagnosis present

## 2019-03-08 DIAGNOSIS — N521 Erectile dysfunction due to diseases classified elsewhere: Secondary | ICD-10-CM | POA: Insufficient documentation

## 2019-03-08 DIAGNOSIS — R252 Cramp and spasm: Secondary | ICD-10-CM | POA: Insufficient documentation

## 2019-03-08 DIAGNOSIS — N319 Neuromuscular dysfunction of bladder, unspecified: Secondary | ICD-10-CM | POA: Insufficient documentation

## 2019-03-08 DIAGNOSIS — G8250 Quadriplegia, unspecified: Secondary | ICD-10-CM

## 2019-03-08 DIAGNOSIS — M7918 Myalgia, other site: Secondary | ICD-10-CM | POA: Insufficient documentation

## 2019-03-08 DIAGNOSIS — Z95828 Presence of other vascular implants and grafts: Secondary | ICD-10-CM | POA: Insufficient documentation

## 2019-03-08 DIAGNOSIS — K592 Neurogenic bowel, not elsewhere classified: Secondary | ICD-10-CM | POA: Diagnosis not present

## 2019-03-08 MED ORDER — SILDENAFIL CITRATE 100 MG PO TABS: 100.0000 mg | ORAL_TABLET | Freq: Every day | ORAL | 1 refills | Status: DC | PRN

## 2019-03-08 NOTE — Progress Notes (Signed)
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     Patient reports injections were helpful for back pain. Thinks lasted ~ 4 weeks.  Pain worse with cold weather.    Asking if would get better for walking again.  Explained, again, that pt is a complete injury and so is extremely unlikely to get ability to walk again.  Does have spasms of LEs but no actual volitional movement.    Exam: Awake, alert, appropriate, accompanied by wife, NAD A few spasms of LEs seen Wearing wrist splints.  Interpretor in room    Plan: 1. Spent 15 minutes going over prognosis again and discussing low/minimal chance of walking in the future.  2. Discussed stem cell trials are for patients that are new injuries at this time.  3. Wants trigger point injections-  Patient here for trigger point injections for chronic neck and upper back pain due to SCI  Consent done and on chart.  Cleaned areas with alcohol and injected using a 27 gauge 1.5 inch needle  Injected 4.5cc Using 1% Lidocaine with no EPI  Upper traps B/L Levators B/L Posterior scalenes B/L Middle scalenes Splenius Capitus B/L Pectoralis Major B/L Rhomboids B/L Infraspinatus Teres Major/minor Thoracic paraspinals Lumbar paraspinals Other injections-    Patient's level of pain prior was 6/10 Current level of pain after injections is sore from injections  but 4/10 overall.   There was no bleeding or complications.  Patient was advised to drink a LOT of water on day after injections to flush system Will have increased soreness for 12-48 hours after injections.  Can use theracane on day of injections in places didn't inject Can use heating pad 4-6 hours AFTER injections.   4. Discussed intimacy, orgasm, and intercourse. Will prescribe Viagra and if doesn't work, will send to Urology.  Spent a total of 45  minutes on appointment; more than 35 minutes discussing sexuality and prognosis.  Will wait on Urology to see if Viagra works.   5. F/U 4 weeks for injections and Urology? Sexual issues.

## 2019-03-08 NOTE — Telephone Encounter (Signed)
Patient's wife called to inform PCP that [facility] is not willing to remove/ change patient IVC Filter. Patient wife states she was advice she would need to have it removed/changed at the facility that placed the IVF Filter in patient. Patient wife gave me the number to the facility in [facility] ( [phone] ) that inserted the IVC Filter. I have called to help patient set up an appointment with the facility since they now have a way of transportation but the facility states they did not insert the IVC Filter.  Patient needs information where she can take her husband to have his filter removed/changed.  Please advice [phone]

## 2019-03-08 NOTE — Patient Instructions (Signed)
1. Spent 15 minutes going over prognosis again and discussing low/minimal chance of walking in the future.  2. Discussed stem cell trials are for patients that are new injuries at this time.  3. Wants trigger point injections-  Patient here for trigger point injections for chronic neck and upper back pain due to SCI  Consent done and on chart.  Cleaned areas with alcohol and injected using a 27 gauge 1.5 inch needle  Injected 4.5cc Using 1% Lidocaine with no EPI  Upper traps B/L Levators B/L Posterior scalenes B/L Middle scalenes Splenius Capitus B/L Pectoralis Major B/L Rhomboids B/L Infraspinatus Teres Major/minor Thoracic paraspinals Lumbar paraspinals Other injections-    Patient's level of pain prior was 6/10 Current level of pain after injections is sore from injections  but 4/10 overall.   There was no bleeding or complications.  Patient was advised to drink a LOT of water on day after injections to flush system Will have increased soreness for 12-48 hours after injections.  Can use theracane on day of injections in places didn't inject Can use heating pad 4-6 hours AFTER injections.   4. Discussed intimacy, orgasm, and intercourse. Will prescribe Viagra and if doesn't work, will send to Urology.  Spent a total of 45 minutes on appointment; more than 35 minutes discussing sexuality and prognosis.  Will wait on Urology to see if Viagra works.

## 2019-03-09 ENCOUNTER — Telehealth (HOSPITAL_COMMUNITY): Payer: Self-pay

## 2019-03-09 NOTE — Telephone Encounter (Signed)
Sent to PCP ?

## 2019-03-09 NOTE — Telephone Encounter (Signed)
Reviewed chart done in [place] at [facility] will try to find MD who perform the placement of the IVC

## 2019-03-10 ENCOUNTER — Encounter: Payer: Self-pay | Admitting: Physical Therapy

## 2019-03-10 ENCOUNTER — Ambulatory Visit: Payer: Medicaid Other | Admitting: Physical Therapy

## 2019-03-10 ENCOUNTER — Other Ambulatory Visit: Payer: Self-pay

## 2019-03-10 DIAGNOSIS — M6281 Muscle weakness (generalized): Secondary | ICD-10-CM

## 2019-03-10 DIAGNOSIS — G8222 Paraplegia, incomplete: Secondary | ICD-10-CM

## 2019-03-10 DIAGNOSIS — R293 Abnormal posture: Secondary | ICD-10-CM

## 2019-03-10 DIAGNOSIS — R29818 Other symptoms and signs involving the nervous system: Secondary | ICD-10-CM | POA: Diagnosis not present

## 2019-03-10 DIAGNOSIS — R208 Other disturbances of skin sensation: Secondary | ICD-10-CM

## 2019-03-10 DIAGNOSIS — G8252 Quadriplegia, C1-C4 incomplete: Secondary | ICD-10-CM

## 2019-03-10 DIAGNOSIS — M542 Cervicalgia: Secondary | ICD-10-CM

## 2019-03-10 NOTE — Therapy (Addendum)
[facility] [address] [place], [place], [address] Phone: [phone]   Fax:  [phone]  Physical Therapy Treatment  Patient Details  Name: [name] MRN: [id number] Date of Birth: 1965-12-31 Referring Provider (PT): Doran Clay, MD - referring physician.  Patient to have care transferred to [facility].  New PCP: Kerin Perna, NP   Encounter Date: 03/10/2019  PT End of Session - 03/10/19 1215    Visit Number  4   Number of Visits  6 (total visit count started over for 2021)    Date for PT Re-Evaluation  03/30/19    Authorization Type  Medicaid    Authorization Time Period  6 visits: 02/17/19-03/30/19    Authorization - Visit Number  4    Authorization - Number of Visits  6    PT Start Time  0936    PT Stop Time  1020    PT Time Calculation (min)  44 min    Equipment Utilized During Treatment  Other (comment)   interpreter services utilized   Activity Tolerance  Patient tolerated treatment well    Behavior During Therapy  [facility] for tasks assessed/performed       Past Medical History:  Diagnosis Date  . Diabetes mellitus without complication ([place])   . History of cervical fracture     Past Surgical History:  Procedure Laterality Date  . CERVICAL SPINE SURGERY      There were no vitals filed for this visit.  Subjective Assessment - 03/10/19 1158    Subjective  Had another round of trigger point injections at doctor's office.  Would like to try using the SCI fit again.    Patient is accompained by:  Family member    Pertinent History  C1 burst fx with [place] transection s/p C4-T6 spinal fusion (6/10), incomplete quadriplegia, previous episodes of autonomic dysreflexia in inpatient rehab    Patient Stated Goals  wants to do exercises as soon as he can, wants to be able to feel his legs again if he can.    Currently in Pain?  No/denies                       [facility] Adult PT  Treatment/Exercise - 03/10/19 1159      Transfers   Transfers  Lateral/Scoot Transfers    Lateral/Scoot Transfers  4: Min assist;3: Mod assist    Lateral Transfers: Patient Percentage  60%    Lateral/Scoot Transfer Details (indicate cue type and reason)  with slideboard on level surface w/c <> mat with therapist assisting with adjusting foot placement, maintaining anterior lean and lateral weight shifting.  Pt able to advance hips with therapist blocking knees to prevent sliding forwards on board      Exercises   Exercises  Other Exercises    Other Exercises   Placed feet on SCI Fit with belt to hold in place to allow pt to come closer to handles and use of UE for LE ROM.  Performed x 4 minutes for UE ROM and strengthening and aerobic conditioning      Shoulder Exercises: Seated   Other Seated Exercises  Performed red theraband resisted UE exercises bimanual and single side seated with feet supported but no back support to also address postural control and sitting balance.  Performed 2 sets x 8 reps: bilat rows, single side D2 flexion <> extension with trunk rotation and resisted trunk rotation with therapist supporting UE to focus on  trunk rotation instead of UE horizontal ABD/ADD.  Intermittent rest breaks due to UE fatigue and to focus on breathing.  One rest break with pt reclined on therapist to allow for neck extension and shoulder retraction.               PT Short Term Goals - 02/09/19 2007      PT SHORT TERM GOAL #1   Title  STG=LTGs        PT Long Term Goals - 02/17/19 1602      PT LONG TERM GOAL #1   Title  Patient will direct final HEP independently to wife/caregivers for stretching LEs in order to prevent risk of contractures. ALL LTGS DUE BY 03/30/2019.    Baseline  dependent with caregivers starting to perform stretches per patient.    Time  6    Period  Weeks   visits   Status  On-going    Target Date  03/30/19      PT LONG TERM GOAL #2   Title  When pt  receives his own w/c (vs. loaner chair) pt will demonstrate independence with w/c parts in order to safely prepare for transfers.    Baseline  Not met to date - pt's wheelchair is still awaiting insurance approval.  Pt is managing arm rests, seat belt and seat functions of loaner w/c    Time  6    Period  Weeks    Status  On-going    Target Date  03/30/19      PT LONG TERM GOAL #3   Title  Patient will perform long sit without UE support >/= 5 minutes with Supervision only for improved ability to assist with ADLs in bed.    Baseline  02/09/19: can remain seated for 6 minutes with intermittent UE support and CGA for safety    Time  6    Period  Weeks    Status  New    Target Date  03/30/19      PT LONG TERM GOAL #4   Title  Patient will perform rolling right and left in bed with min A in order to decrease caregiver burden and for pressure relief.    Baseline  02/09/19: modA for LE placement and initiating weight shift    Time  6    Period  Weeks    Status  On-going    Target Date  03/30/19      PT LONG TERM GOAL #5   Title  Patient will perform lateral scoot transfer on sliding board from w/c <> mat table on unlevel surface with minA/mod assist (not including placement of board)    Baseline  02/01/19: min A today on level surface (02/01/19)    Time  6    Period  Weeks    Status  Revised    Target Date  03/30/19            Plan - 03/10/19 1215    Clinical Impression Statement  Pt continues to make good progress with level slideboard transfers but continues to require assistance to maintain anterior lean/weight shift.  Attempted to utilize theraband for resisted UE strengthening and balance training - pt continues to require some back support or UE support to be able to incorporate trunk rotation.  Able to utilize [employer] with LE on pedals to increase LE ROM with UE strengthening and aerobic conditioning.  WIll continue to address in order to progress towards LTG.    PT  Frequency   1x / week    PT Duration  6 weeks    PT Treatment/Interventions  ADLs/Self Care Home Management;DME Instruction;Functional mobility training;Therapeutic activities;Therapeutic exercise;Balance training;Neuromuscular re-education;Wheelchair mobility training;Patient/family education;Passive range of motion;Electrical Stimulation;Orthotic Fit/Training;Manual techniques;Dry needling;Energy conservation    PT Next Visit Plan  Did he try stretches in queen bed at home?  SCI FIT with belt around each foot to support on pedal.  Slideboard transfers progress from level to uneven transfers.  Bed mobility: rolling with UE momentum; pushing up to sitting - long sitting, circle sitting wiht dynamic reaching. Short Sitting balance A/P and M/L, range of motion hips and shoulders, UE strengthening.  Management of LE during transfers and bed mobility. Sitting balance without UE support & dynamic reaching.    Consulted and Agree with Plan of Care  Patient;Family member/caregiver       Patient will benefit from skilled therapeutic intervention in order to improve the following deficits and impairments:  Decreased activity tolerance, Decreased balance, Decreased mobility, Decreased endurance, Decreased range of motion, Decreased strength, Impaired flexibility, Impaired sensation, Impaired UE functional use, Pain, Impaired tone, Postural dysfunction  Visit Diagnosis: Other symptoms and signs involving the nervous system  Other disturbances of skin sensation  Muscle weakness (generalized)  Abnormal posture  Cervicalgia  Quadriplegia, C1-C4 incomplete ([facility])  Paraplegia, incomplete ([place])     Problem List Patient Active Problem List   Diagnosis Date Noted  . Spasticity 03/08/2019  . Erectile dysfunction due to diseases classified elsewhere 03/08/2019  . 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 Neurogenic bowel 01/22/2019  . Neurogenic bladder 01/22/2019  . Myofascial pain 01/22/2019  . Spinal cord injury  at T1-T6 level ([place]) 12/30/2018  . Quadriplegia ([place]) 12/30/2018    Rico Junker, PT, DPT 03/10/19    12:19 PM    [facility] [address] [place], [place], [address] Phone: [phone]   Fax:  [phone]  Name: [name] MRN: [id number] Date of Birth: 03-23-1965

## 2019-03-17 ENCOUNTER — Other Ambulatory Visit: Payer: Self-pay

## 2019-03-17 ENCOUNTER — Ambulatory Visit: Payer: Medicaid Other | Attending: Physical Medicine and Rehabilitation | Admitting: Physical Therapy

## 2019-03-17 ENCOUNTER — Encounter: Payer: Self-pay | Admitting: Physical Therapy

## 2019-03-17 DIAGNOSIS — R293 Abnormal posture: Secondary | ICD-10-CM

## 2019-03-17 DIAGNOSIS — R209 Unspecified disturbances of skin sensation: Secondary | ICD-10-CM | POA: Diagnosis present

## 2019-03-17 DIAGNOSIS — M6281 Muscle weakness (generalized): Secondary | ICD-10-CM | POA: Diagnosis present

## 2019-03-17 DIAGNOSIS — M542 Cervicalgia: Secondary | ICD-10-CM | POA: Diagnosis present

## 2019-03-17 DIAGNOSIS — G8252 Quadriplegia, C1-C4 incomplete: Secondary | ICD-10-CM | POA: Diagnosis present

## 2019-03-17 DIAGNOSIS — R208 Other disturbances of skin sensation: Secondary | ICD-10-CM

## 2019-03-17 DIAGNOSIS — R29818 Other symptoms and signs involving the nervous system: Secondary | ICD-10-CM | POA: Insufficient documentation

## 2019-03-17 NOTE — Therapy (Signed)
[facility] [address] [place], [place], [address] Phone: [phone]   Fax:  [phone]  Physical Therapy Treatment  Patient Details  Name: [name] MRN: [id number] Date of Birth: 02-03-1966 Referring Provider (PT): Doran Clay, MD - referring physician.  Patient to have care transferred to [facility].  New PCP: Kerin Perna, NP   Encounter Date: 03/17/2019  PT End of Session - 03/17/19 1047    Visit Number  5    Number of Visits  6   total visit count for 2021   Date for PT Re-Evaluation  03/30/19    Authorization Type  Medicaid    Authorization Time Period  6 visits: 02/17/19-03/30/19    Authorization - Visit Number  5    Authorization - Number of Visits  6    PT Start Time  0940    PT Stop Time  1025    PT Time Calculation (min)  45 min    Equipment Utilized During Treatment  Other (comment)   interpreter services utilized   Activity Tolerance  Patient tolerated treatment well    Behavior During Therapy  [facility] for tasks assessed/performed       Past Medical History:  Diagnosis Date  . Diabetes mellitus without complication ([place])   . History of cervical fracture     Past Surgical History:  Procedure Laterality Date  . CERVICAL SPINE SURGERY      There were no vitals filed for this visit.  Subjective Assessment - 03/17/19 1029    Subjective  Pt arrived in his new power w/c.  Nothing different about new w/c except it has a seat elevator.  No issues after performing SCI Fit last week.  Pt reports his stomach feels bigger and tight; reports he is having regular, normal BM every night.    Patient is accompained by:  Family member    Pertinent History  C1 burst fx with [place] transection s/p C4-T6 spinal fusion (6/10), incomplete quadriplegia, previous episodes of autonomic dysreflexia in inpatient rehab    Patient Stated Goals  wants to do exercises as soon as he can, wants to be able to feel his  legs again if he can.    Currently in Pain?  No/denies                       [facility] Adult PT Treatment/Exercise - 03/17/19 1032      Bed Mobility   Bed Mobility  Supine to Sit;Sit to Supine;Sitting - Scoot to Edge of Bed    Supine to Sit  2 Helpers   supine > long sitting hooking UE   Sitting - Scoot to Edge of Bed  Moderate Assistance - Patient 50-74%   short sitting edge of mat   Sit to Supine  2 Helpers   see comments below     Transfers   Transfers  Lateral/Scoot Transfers    Lateral/Scoot Transfers  4: Min assist    Lateral Transfers: Patient Percentage  60%    Lateral/Scoot Transfer Details (indicate cue type and reason)  with slideboard on level surface w/c <> mat with therapist assisting with adjusting foot placement, maintaining anterior lean and lateral weight shifting.  Pt able to advance hips with therapist blocking knees to prevent sliding forwards on board.  Use of belt around LE to assist with lifting and moving each LE and lifting LE to place slideboard    Sit to Supine Details  From short sitting on mat performed 8 repetitions of lifting bilat LE at once with bilat UE holding belt strapped around LE and use of body momentum leaning back quickly to bring LE up onto mat.  Cues to maintain trunk flexion to improve leverage and decrease how much UE strength required to bring LE up.  Therapist assisted with controlling backwards leaning and tech assisted with bringing LE up fully on mat.  Final repetition performed full movement of leaning back and then rolling to R side to end up in supine position.  Transitioned from supine > long sitting hooking bilat UE.  When transitioning to edge of mat from long sitting, tech assisted with lifting/advancing RLE with pt using strap to lift and advance LLE to edge of mat while therapist assisted with lateral lean to R side.      Dynamic Sitting Balance   Dynamic Sitting - Balance Support  No upper extremity supported     Dynamic Sitting - Level of Assistance  4: Min assist;3: Mod assist    Dynamic Sitting Balance - Compensations  Provided posterior support in long sitting while pt performed forward stretches and then performed dynamic sitting balance without UE support catching and tossing larger ball to tech; pt able to pull away from therapist's support intermittently but not able to maintain without UE support.             PT Education - 03/17/19 1046    Education Details  sit > supine using leg strap and body momentum.  Plan to ask for 6 more visits at next session and after 6 more visits will take a break from therapy to conserve visits.    Person(s) Educated  Patient    Methods  Explanation;Demonstration    Comprehension  Verbalized understanding;Need further instruction       PT Short Term Goals - 02/09/19 2007      PT SHORT TERM GOAL #1   Title  STG=LTGs        PT Long Term Goals - 03/17/19 1053      PT LONG TERM GOAL #1   Title  Patient will direct final HEP independently to wife/caregivers for stretching LEs in order to prevent risk of contractures. ALL LTGS DUE BY 03/30/2019.    Baseline  dependent with caregivers starting to perform stretches per patient.    Time  6    Period  Weeks   visits   Status  On-going      PT LONG TERM GOAL #2   Title  When pt receives his own w/c (vs. loaner chair) pt will demonstrate independence with w/c parts in order to safely prepare for transfers.    Baseline  Not met to date - pt's wheelchair is still awaiting insurance approval.  Pt is managing arm rests, seat belt and seat functions of loaner w/c    Time  6    Period  Weeks    Status  On-going      PT LONG TERM GOAL #3   Title  Patient will perform long sit without UE support >/= 5 minutes with Supervision only for improved ability to assist with ADLs in bed.    Baseline  02/09/19: can remain seated for 6 minutes with intermittent UE support and CGA for safety    Time  6    Period  Weeks     Status  New      PT LONG TERM GOAL #4   Title  Patient will  perform rolling right and left in bed with min A in order to decrease caregiver burden and for pressure relief.    Baseline  02/09/19: modA for LE placement and initiating weight shift    Time  6    Period  Weeks    Status  On-going      PT LONG TERM GOAL #5   Title  Patient will perform lateral scoot transfer on sliding board from w/c <> mat table on unlevel surface with minA/mod assist (not including placement of board)    Baseline  02/01/19: min A today on level surface (02/01/19)    Time  6    Period  Weeks    Status  Revised            Plan - 03/17/19 1049    Clinical Impression Statement  Pt has new power wheelchair and demonstrates independence with use of it.  Continued to focus on pt increasing assistance with transfers from w/c <> level surface with slideboard and use of body momentum and leg straps to transition sit > supine.  Pt demonstrated improved ability to use strap to assist with LE lifting and placement during slideboard transfers.  Still requires +2 for sit <> supine transfers on flat mat.  Pt continues to require posterior support for long sitting position.  Will continue to address in order to progress towards LTG.    PT Frequency  1x / week    PT Duration  6 weeks    PT Treatment/Interventions  ADLs/Self Care Home Management;DME Instruction;Functional mobility training;Therapeutic activities;Therapeutic exercise;Balance training;Neuromuscular re-education;Wheelchair mobility training;Patient/family education;Passive range of motion;Electrical Stimulation;Orthotic Fit/Training;Manual techniques;Dry needling;Energy conservation    PT Next Visit Plan  Check LTG and recert for 6 more visits and then will take a break; needs more visits scheduled with PT.  Did he try stretches in queen bed at home?  SCI FIT with belt around each foot to support on pedal.  Slideboard transfers progress from level to uneven  transfers.  Bed mobility: rolling with UE momentum; pushing up to sitting - long sitting, circle sitting wiht dynamic reaching. Short Sitting balance A/P and M/L, range of motion hips and shoulders, UE strengthening.  Management of LE during transfers and bed mobility. Sitting balance without UE support & dynamic reaching.    Consulted and Agree with Plan of Care  Patient;Family member/caregiver       Patient will benefit from skilled therapeutic intervention in order to improve the following deficits and impairments:  Decreased activity tolerance, Decreased balance, Decreased mobility, Decreased endurance, Decreased range of motion, Decreased strength, Impaired flexibility, Impaired sensation, Impaired UE functional use, Pain, Impaired tone, Postural dysfunction  Visit Diagnosis: Other symptoms and signs involving the nervous system  Other disturbances of skin sensation  Muscle weakness (generalized)  Abnormal posture  Cervicalgia  Quadriplegia, C1-C4 incomplete ([place])     Problem List Patient Active Problem List   Diagnosis Date Noted  . Spasticity 03/08/2019  . Erectile dysfunction due to diseases classified elsewhere 03/08/2019  . 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 Neurogenic bowel 01/22/2019  . Neurogenic bladder 01/22/2019  . Myofascial pain 01/22/2019  . Spinal cord injury at T1-T6 level ([place]) 12/30/2018  . Quadriplegia ([place]) 12/30/2018   Rico Junker, PT, DPT 03/17/19    10:54 AM  [facility] [address] [place], [place], [address] Phone: [phone]   Fax:  [phone]  Name: [name] MRN: [id number] Date of Birth: 1965-11-08

## 2019-03-24 ENCOUNTER — Encounter: Payer: Self-pay | Admitting: Physical Therapy

## 2019-03-24 ENCOUNTER — Ambulatory Visit: Payer: Medicaid Other | Admitting: Physical Therapy

## 2019-03-24 ENCOUNTER — Other Ambulatory Visit: Payer: Self-pay

## 2019-03-24 DIAGNOSIS — R29818 Other symptoms and signs involving the nervous system: Secondary | ICD-10-CM | POA: Diagnosis not present

## 2019-03-24 DIAGNOSIS — R293 Abnormal posture: Secondary | ICD-10-CM

## 2019-03-24 DIAGNOSIS — M542 Cervicalgia: Secondary | ICD-10-CM

## 2019-03-24 DIAGNOSIS — M6281 Muscle weakness (generalized): Secondary | ICD-10-CM

## 2019-03-24 DIAGNOSIS — G8252 Quadriplegia, C1-C4 incomplete: Secondary | ICD-10-CM

## 2019-03-24 DIAGNOSIS — R208 Other disturbances of skin sensation: Secondary | ICD-10-CM

## 2019-03-24 NOTE — Therapy (Signed)
[facility] [address] [place], [place], [address] Phone: [phone]   Fax:  [phone]  Physical Therapy Treatment  Patient Details  Name: [name] MRN: [id number] Date of Birth: [birth year] Referring Provider (PT): Doran Clay, MD - referring physician.  Patient to have care transferred to [facility].  New PCP: Kerin Perna, NP   Encounter Date: 03/24/2019  PT End of Session - 03/24/19 1204    Visit Number  6    Number of Visits  6   total visit count for 2021   Date for PT Re-Evaluation  03/30/19    Authorization Type  Medicaid    Authorization Time Period  6 visits: 02/17/19-03/30/19    Authorization - Visit Number  6    Authorization - Number of Visits  6    PT Start Time  0935    PT Stop Time  1016    PT Time Calculation (min)  41 min    Equipment Utilized During Treatment  Other (comment)   interpreter services utilized   Activity Tolerance  Patient tolerated treatment well    Behavior During Therapy  [facility] for tasks assessed/performed       Past Medical History:  Diagnosis Date  . Diabetes mellitus without complication ([place])   . History of cervical fracture     Past Surgical History:  Procedure Laterality Date  . CERVICAL SPINE SURGERY      There were no vitals filed for this visit.  Subjective Assessment - 03/24/19 1154    Subjective  Pt reports abdomen feels better and he feels looser today.  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    Currently in Pain?  No/denies         [facility] PT Assessment - 03/24/19 1155      Assessment   Medical Diagnosis  SCI    Referring  Provider (PT)  Doran Clay, MD - referring physician.  Patient to have care transferred to [facility].  New PCP: Kerin Perna, NP    Onset Date/Surgical Date  07/22/18    Prior Therapy  PT, OT, SLP inpatient rehab at [facility]      Precautions   Precautions  Fall    Precaution Comments  Has an abdominal binder at home for BP management, has not been wearing it due to fear of pressure sores. Figure 8 wraps for BP when OOB. autonomic dysreflexia (previous episodes at inpatient rehab in [facility]).       Prior Function   Level of Independence  Independent      Observation/Other Assessments   Focus on Therapeutic Outcomes (FOTO)   N/A      Bed Mobility   Bed Mobility  Rolling Right;Rolling Left;Left Sidelying to Sit;Sit to Supine    Rolling Right  Minimal Assistance - Patient > 75%   LE management   Rolling Left  Minimal Assistance - Patient > 75%   for LE management   Left Sidelying to Sit  Moderate Assistance - Patient 50-74%   for LE management   Sit to Supine  Maximal Assistance - Patient 25-49%  for LE management and trunk control   Sit to Sidelying Right  Contact Guard/Touching assist   repeated sit<>on elbow leaning forwards and pushing up    Sit to Sidelying Left  Contact Guard/Touching assist   repeated sit<>on elbow leaning forwards and pushing up      Transfers   Transfers  Lateral/Scoot Transfers    Lateral/Scoot Transfers  4: Min assist    Lateral Transfers: Patient Percentage  70%    Lateral/Scoot Transfer Details (indicate cue type and reason)  therapist only provided assistance to adjust foot position, maintain LE position and for lateral leaning to unweight hips.                   [facility] Adult PT Treatment/Exercise - 03/24/19 1155      Exercises   Exercises  Other Exercises    Other Exercises   Pt set up on SCI Fit with LE on pedals, resistance set at 1.5 for pt to perform for 15 minutes for general UE strengthening and aerobic conditioning at  end of session. Pt to be assisted off Insurance risk surveyor by rehab tech             PT Education - 03/24/19 1203    Education Details  progress towards goals, will ask for 6 more visits    Person(s) Educated  Patient    Methods  Explanation    Comprehension  Verbalized understanding       PT Short Term Goals - 03/24/19 1215      PT SHORT TERM GOAL #1   Title  STG=LTGs        PT Long Term Goals - 03/24/19 1204      PT LONG TERM GOAL #1   Title  Patient will direct final HEP independently to wife/caregivers for stretching LEs in order to prevent risk of contractures. ALL LTGS DUE BY 03/30/2019.    Time  6    Period  Weeks   visits   Status  Achieved      PT LONG TERM GOAL #2   Title  When pt receives his own w/c (vs. loaner chair) pt will demonstrate independence with w/c parts in order to safely prepare for transfers.    Time  6    Period  Weeks    Status  Achieved      PT LONG TERM GOAL #3   Title  Patient will perform long sit without UE support >/= 5 minutes with Supervision only for improved ability to assist with ADLs in bed.    Baseline  10 minutes with intermittent lifting UE but requires support from therapist behind him    Time  6    Period  Weeks    Status  Partially Met      PT LONG TERM GOAL #4   Title  Patient will perform rolling right and left in bed with min A in order to decrease caregiver burden and for pressure relief.    Time  6    Period  Weeks    Status  Achieved      PT LONG TERM GOAL #5   Title  Patient will perform lateral scoot transfer on sliding board from w/c <> mat table on unlevel surface with minA/mod assist (not including placement of board)    Baseline  Min A level surface    Time  6    Period  Weeks    Status  Partially Met  New LTG for recert: PT Long Term Goals - 03/24/19 1217      PT LONG TERM GOAL #1   Title  Patient will direct final HEP independently to wife/caregivers with final HEP (stretching, balance exercises)  to maintain ROM and mobility at completion of PT.    Baseline  performing basic stretches in hospital bed    Time  6    Period  Weeks   visits   Status  Revised      PT LONG TERM GOAL #2   Title  Pt will transition from R sidelying or L sidelying to sit edge of mat with min A for intermittent LE management    Baseline  Mod A    Time  6    Period  Weeks    Status  New      PT LONG TERM GOAL #3   Title  Patient will perform long sit/circle without UE support >/= 5 minutes with min guard for improved ability to assist with ADLs in bed.    Baseline  10 minutes with intermittent lifting UE but requires support from therapist behind him    Time  6    Period  Weeks    Status  Revised      PT LONG TERM GOAL #4   Title  Pt will transition from sidelying <> long sitting/circle sitting with min A of one person    Baseline  +2 assistance currently    Time  6    Period  Weeks    Status  New      PT LONG TERM GOAL #5   Title  Patient will perform lateral scoot transfer on sliding board from w/c <> mat table on unlevel surface with minA/mod assist (not including placement of board)    Baseline  Min A level surface    Time  6    Period  Weeks    Status  Revised          Plan - 03/24/19 1206    Clinical Impression Statement  Pt is making excellent progress and has met 3/5 LTG - pt and family are independent with current stretching and HEP, is Min A for rolling to L and R and Mod A for sit <> supine for LE management, min A for slideboard on level surfaces but continues to require moderate assistance for balance and trunk control in long sitting and circle sitting.  Pt continues to be unable to practice bed mobility and transitions in hospital bed at home but is planning on transferring into queen bed and practicing on flat bed.  Pt has received his power wheelchair and is independent with functions and set up.  Pt would benefit from continued skilled PT services to continue to increase  independence with transfers on uneven surfaces, long sitting and circle sitting to allow pt to assist with ADLs, sitting balance, ROM and strengthening to maximize functional mobility independence.    Rehab Potential  Good    PT Frequency  1x / week    PT Duration  6 weeks    PT Treatment/Interventions  ADLs/Self Care Home Management;DME Instruction;Functional mobility training;Therapeutic activities;Therapeutic exercise;Balance training;Neuromuscular re-education;Wheelchair mobility training;Patient/family education;Passive range of motion;Electrical Stimulation;Orthotic Fit/Training;Manual techniques;Dry needling;Energy conservation    PT Next Visit Plan  needs more visits scheduled with PT.  Did he try stretches in queen bed at home?  SCI FIT with belt around each foot to support on pedal.  Slideboard transfers progress from level to  uneven transfers.  Bed mobility: pushing up to sitting - long sitting, circle sitting with dynamic reaching. Short Sitting balance A/P and M/L, range of motion hips and shoulders, UE strengthening.  Management of LE during transfers and bed mobility. Sitting balance without UE support & dynamic reaching.  Standing when we get standing frame    Consulted and Agree with Plan of Care  Patient;Family member/caregiver       Patient will benefit from skilled therapeutic intervention in order to improve the following deficits and impairments:  Decreased activity tolerance, Decreased balance, Decreased mobility, Decreased endurance, Decreased range of motion, Decreased strength, Impaired flexibility, Impaired sensation, Impaired UE functional use, Pain, Impaired tone, Postural dysfunction  Visit Diagnosis: Other symptoms and signs involving the nervous system  Other disturbances of skin sensation  Muscle weakness (generalized)  Abnormal posture  Cervicalgia  Quadriplegia, C1-C4 incomplete ([place])     Problem List Patient Active Problem List   Diagnosis Date Noted   . Spasticity 03/08/2019  . Erectile dysfunction due to diseases classified elsewhere 03/08/2019  . 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 Neurogenic bowel 01/22/2019  . Neurogenic bladder 01/22/2019  . Myofascial pain 01/22/2019  . Spinal cord injury at T1-T6 level ([place]) 12/30/2018  . Quadriplegia ([place]) 12/30/2018    Rico Junker, PT, DPT 03/24/19    12:17 PM    [facility] [place] [address] [place], [place], [address] Phone: [phone]   Fax:  [phone]  Name: [name] MRN: [id number] Date of Birth: [birth year]

## 2019-03-29 ENCOUNTER — Ambulatory Visit: Payer: Medicaid Other

## 2019-04-05 ENCOUNTER — Ambulatory Visit: Payer: Medicaid Other

## 2019-04-05 ENCOUNTER — Other Ambulatory Visit: Payer: Self-pay

## 2019-04-05 ENCOUNTER — Encounter
Payer: Medicaid Other | Attending: Physical Medicine and Rehabilitation | Admitting: Physical Medicine and Rehabilitation

## 2019-04-05 DIAGNOSIS — R29818 Other symptoms and signs involving the nervous system: Secondary | ICD-10-CM | POA: Diagnosis not present

## 2019-04-05 DIAGNOSIS — M6281 Muscle weakness (generalized): Secondary | ICD-10-CM

## 2019-04-05 DIAGNOSIS — M7918 Myalgia, other site: Secondary | ICD-10-CM | POA: Insufficient documentation

## 2019-04-05 DIAGNOSIS — Z95828 Presence of other vascular implants and grafts: Secondary | ICD-10-CM | POA: Insufficient documentation

## 2019-04-05 DIAGNOSIS — K592 Neurogenic bowel, not elsewhere classified: Secondary | ICD-10-CM | POA: Insufficient documentation

## 2019-04-05 DIAGNOSIS — S24101A Unspecified injury at T1 level of thoracic spinal cord, initial encounter: Secondary | ICD-10-CM | POA: Insufficient documentation

## 2019-04-05 DIAGNOSIS — R293 Abnormal posture: Secondary | ICD-10-CM

## 2019-04-05 DIAGNOSIS — G8250 Quadriplegia, unspecified: Secondary | ICD-10-CM | POA: Insufficient documentation

## 2019-04-05 DIAGNOSIS — N319 Neuromuscular dysfunction of bladder, unspecified: Secondary | ICD-10-CM | POA: Insufficient documentation

## 2019-04-05 NOTE — Therapy (Signed)
[facility] [address] [place], [place], [address] Phone: [phone]   Fax:  [phone]  Physical Therapy Treatment  Patient Details  Name: [name] MRN: [id number] Date of Birth: May 15, 1965 Referring Provider (PT): Lanny Cramp, MD - referring physician.  Patient to have care transferred to [facility].  New PCP: Grayce Sessions, NP   Encounter Date: 04/05/2019  PT End of Session - 04/05/19 0932    Visit Number  7    Number of Visits  12   total visit count for 2021   Date for PT Re-Evaluation  03/30/19    Authorization Type  Medicaid    Authorization Time Period  6 visits: 02/17/19-03/30/19, 6 visits 2/22-05/16/19    Authorization - Visit Number  1    Authorization - Number of Visits  6    PT Start Time  0925    PT Stop Time  1012    PT Time Calculation (min)  47 min    Equipment Utilized During Treatment  Other (comment)   interpreter services utilized   Activity Tolerance  Patient tolerated treatment well    Behavior During Therapy  [facility] for tasks assessed/performed       Past Medical History:  Diagnosis Date  . Diabetes mellitus without complication ([facility])   . History of cervical fracture     Past Surgical History:  Procedure Laterality Date  . CERVICAL SPINE SURGERY      There were no vitals filed for this visit.  Subjective Assessment - 04/05/19 0931    Subjective  Pt reports he is doing well.    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    Currently in Pain?  No/denies                       [facility] Adult PT Treatment/Exercise - 04/05/19 0933      Bed Mobility   Bed Mobility  Supine to  Sit;Sitting - Scoot to Edge of Bed;Sit to Supine    Supine to Sit  Maximal Assistance - Patient - Patient 25-49%   supine to long sit pulling on PT and then grabbing leg strap   Sitting - Scoot to Edge of Bed  Maximal Assistance - Patient 25-49%    Sit to Supine  Maximal Assistance - Patient 25-49%   for RLE management as used leg strap on LLE     Transfers   Transfers  Lateral/Scoot Transfers    Lateral/Scoot Transfers  4: Min assist    Lateral/Scoot Transfer Details (indicate cue type and reason)  PT assist to initiate from chair then just for LE management and weight shift. PT assisted to place board but patient able to help with leaning to side. With return to powerchair pt utilized left leg strap to assist to position left foot on foot plate at end of transfer.      Therapeutic Activites    Therapeutic Activities  Other Therapeutic Activities    Other Therapeutic Activities  Sitting edge of mat: leaning down to side on forearm and coming back up x 5 each side. PT placed leg strap on left leg and  had patient practice moving left leg in and out with cues to use bicep to help lift and lean back some versus trying to lift from hand. Pt able to scoot leg to side min assist using this technique. Sit to supine with patient using left leg lifter to get left leg on mat and PT placing right leg. Supine to long sit pulling on therapists arms and therapists leaning back then patient grabbing leg strap to finish movement. Once in long sit patient able to utilize left leg strap to maintain position. Performed coming down to left on forearm then coming back up to long sit min assist with verbal cues to keep weight forward. Pt also utilized leg strap to help. Performed x 5. Pt performed long sit to partial supine to about 45 degrees holding to left leg strap to help control as well as using right arm to help support. Peformed x 5 needing min/mod assist from PT. Pt utilized leg strap to attempt to slide left leg  off mat but shoe kept getting stuck and needed max assist of PT still as legs stiff.       Exercises   Exercises  Other Exercises    Other Exercises   Sci Fit with foot strapped in to pedal with arms pushing x 9 min level 1.5. HR after=64. PT needed to adjust straps on feet a couple times. Found placing powerchair closer helped.             PT Education - 04/05/19 1323    Education Details  Pt to continue with current HEP    Person(s) Educated  Patient    Methods  Explanation    Comprehension  Verbalized understanding       PT Short Term Goals - 03/24/19 1215      PT SHORT TERM GOAL #1   Title  STG=LTGs        PT Long Term Goals - 03/24/19 1217      PT LONG TERM GOAL #1   Title  Patient will direct final HEP independently to wife/caregivers with final HEP (stretching, balance exercises) to maintain ROM and mobility at completion of PT.    Baseline  performing basic stretches in hospital bed    Time  6    Period  Weeks   visits   Status  Revised      PT LONG TERM GOAL #2   Title  Pt will transition from R sidelying or L sidelying to sit edge of mat with min A for intermittent LE management    Baseline  Mod A    Time  6    Period  Weeks    Status  New      PT LONG TERM GOAL #3   Title  Patient will perform long sit/circle without UE support >/= 5 minutes with min guard for improved ability to assist with ADLs in bed.    Baseline  10 minutes with intermittent lifting UE but requires support from therapist behind him    Time  6    Period  Weeks    Status  Revised      PT LONG TERM GOAL #4   Title  Pt will transition from sidelying <> long sitting/circle sitting with min A of one person    Baseline  +2 assistance currently    Time  6    Period  Weeks    Status  New      PT LONG TERM GOAL #5  Title  Patient will perform lateral scoot transfer on sliding board from w/c <> mat table on unlevel surface with minA/mod assist (not including placement of board)     Baseline  Min A level surface    Time  6    Period  Weeks    Status  Revised            Plan - 04/05/19 1326    Clinical Impression Statement  PT initiated leg straps during session today as just obtained and patient was able to begin to assist more with LE management with mobility. Also allowed him more assistance in long sit to help stabilize.    Rehab Potential  Good    PT Frequency  1x / week    PT Duration  6 weeks    PT Treatment/Interventions  ADLs/Self Care Home Management;DME Instruction;Functional mobility training;Therapeutic activities;Therapeutic exercise;Balance training;Neuromuscular re-education;Wheelchair mobility training;Patient/family education;Passive range of motion;Electrical Stimulation;Orthotic Fit/Training;Manual techniques;Dry needling;Energy conservation    PT Next Visit Plan  Did he try stretches in queen bed at home? Continue use of leg straps for mobility. SCI FIT with belt around each foot to support on pedal.  Slideboard transfers progress from level to uneven transfers.  Bed mobility: pushing up to sitting - long sitting, circle sitting with dynamic reaching. Short Sitting balance A/P and M/L, range of motion hips and shoulders, UE strengthening.  Management of LE during transfers and bed mobility. Sitting balance without UE support & dynamic reaching.  Standing when we get standing frame    Consulted and Agree with Plan of Care  Patient;Family member/caregiver       Patient will benefit from skilled therapeutic intervention in order to improve the following deficits and impairments:  Decreased activity tolerance, Decreased balance, Decreased mobility, Decreased endurance, Decreased range of motion, Decreased strength, Impaired flexibility, Impaired sensation, Impaired UE functional use, Pain, Impaired tone, Postural dysfunction  Visit Diagnosis: Muscle weakness (generalized)  Abnormal posture     Problem List Patient Active Problem List    Diagnosis Date Noted  . Spasticity 03/08/2019  . Erectile dysfunction due to diseases classified elsewhere 03/08/2019  . 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 Neurogenic bowel 01/22/2019  . Neurogenic bladder 01/22/2019  . Myofascial pain 01/22/2019  . 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 Quadriplegia ([facility]) 12/30/2018    Ronn Melena, PT, DPT, NCS 04/05/2019, 1:30 PM  [facility] [address] [place], [place], [address] Phone: [phone]   Fax:  [phone]  Name: [name] MRN: [id number] Date of Birth: 1965/09/02

## 2019-04-15 ENCOUNTER — Ambulatory Visit: Payer: Medicaid Other | Attending: Physical Medicine and Rehabilitation

## 2019-04-15 ENCOUNTER — Other Ambulatory Visit: Payer: Self-pay

## 2019-04-15 DIAGNOSIS — R293 Abnormal posture: Secondary | ICD-10-CM | POA: Diagnosis present

## 2019-04-15 DIAGNOSIS — M542 Cervicalgia: Secondary | ICD-10-CM | POA: Insufficient documentation

## 2019-04-15 DIAGNOSIS — M6281 Muscle weakness (generalized): Secondary | ICD-10-CM | POA: Insufficient documentation

## 2019-04-15 DIAGNOSIS — G8252 Quadriplegia, C1-C4 incomplete: Secondary | ICD-10-CM | POA: Diagnosis present

## 2019-04-15 DIAGNOSIS — R209 Unspecified disturbances of skin sensation: Secondary | ICD-10-CM | POA: Diagnosis present

## 2019-04-15 DIAGNOSIS — R29818 Other symptoms and signs involving the nervous system: Secondary | ICD-10-CM | POA: Diagnosis present

## 2019-04-15 NOTE — Therapy (Addendum)
[facility] [address] [place], [place], [address] Phone: [phone]   Fax:  [phone]  Physical Therapy Treatment  Patient Details  Name: [name] MRN: [id number] Date of Birth: 23-Mar-1965 Referring Provider (PT): Lanny Cramp, MD - referring physician.  Patient to have care transferred to [facility].  New PCP: Grayce Sessions, NP   Encounter Date: 04/15/2019  PT End of Session - 04/15/19 0851    Visit Number  8    Number of Visits  12   total visit count for 2021   Date for PT Re-Evaluation  03/30/19    Authorization Type  Medicaid    Authorization Time Period  6 visits: 02/17/19-03/30/19, 6 visits 2/22-05/16/19    Authorization - Visit Number  2    Authorization - Number of Visits  6    PT Start Time  0845    PT Stop Time  0930    PT Time Calculation (min)  45 min    Equipment Utilized During Treatment  Other (comment)   interpreter services utilized   Activity Tolerance  Patient tolerated treatment well    Behavior During Therapy  [facility] for tasks assessed/performed       Past Medical History:  Diagnosis Date  . Diabetes mellitus without complication (HCC)   . History of cervical fracture     Past Surgical History:  Procedure Laterality Date  . CERVICAL SPINE SURGERY      There were no vitals filed for this visit.  Subjective Assessment - 04/15/19 0850    Subjective  Pt reports that he is doing well. Shoulder a little sore due to exercises.    Patient is accompained by:  Family member    Pertinent History  C1 burst fx with [place] transection s/p C4-T6 spinal fusion (6/10), incomplete quadriplegia, previous episodes of autonomic dysreflexia in inpatient rehab    Patient Stated Goals  wants to do exercises as soon as he can, wants to be able to feel his legs again if he can.    Currently in Pain?  Yes    Pain Score  4     Pain Location  Shoulder    Pain Orientation  Right    Pain Descriptors /  Indicators  Sore                       [facility] Adult PT Treatment/Exercise - 04/15/19 0851      Transfers   Transfers  Lateral/Scoot Transfers;Supine to Sit;Sit to Supine    Lateral/Scoot Transfers  4: Min assist    Lateral/Scoot Transfer Details (indicate cue type and reason)  Pt was min assist to get started on transfer board from powerchair but once got going PT only min/CGA to block at legs to prevent patient from coming too far forward. Pt was also cued to lean head opposite way he wanted hips to go to help with movement. Pt was able to go mat to powerchair with PT only blocking at knees and helping to reposition feet min/ CGA. Pt utilized chair to leaning back to help scoot back all the way and straighten pelvis some. PT did place transfer board each time for patient with patient leaning away to assist.    Supine to Sit  3: Mod assist   +2 with patient pulling on left leg strap once half way up   Supine to Sit Details (indicate cue type and reason)  Pt pulled on therapist and tech  arms to start transfer to long sit then grabbed left strap on left to further assist.    Sit to Supine  3: Mod assist    Sit to Supine Details (indicate cue type and reason)  Verbal cues for technique    Sit to Supine Details   Pt utilized leg strap on left leg and came down to right sidelying lifting left leg using strap only min assist at leg then max assist to bring right leg on to mat by therapist. Rehab tech spotting at trunk for safety.      Therapeutic Activites    Therapeutic Activities  Other Therapeutic Activities    Other Therapeutic Activities  Sitting edge of mat with left leg strap donned by practiced moving left leg in and out on 4" step under feet to allow him support x 4 with min assist of PT. Instructed to utilize bicep with strap on forearm versus pulling with hand which helped. In long sit: pt utilized left leg strap to working on stretching for hamstrings x 2 min. Then performed  partial long sit to lying back on wedge using left leg strap to control descent and come back up min assist. Also had patient place right arm back to side and posterior but lacking full extension to try to help as well and performed x 5. Sitting in long sit with trying to prop back on arms extended behind and ER at shoulders then push back up x 5 with min assist of PT. Pt lacks full shoulder extension which limits him but required less assist overall in this position today. Long sit with trying to move left leg in and out using leg lifter x 2 each direction min assist. Again cued to use bicep more and lean to help. Lack of hip motion makes challenging to try to bring leg in for more circle sit position. Pt performed long sit down to each side x 5 to forearm and coming back up CGA/min assist. Had him avoid using leg strap to help this time. Pt demonstrating more trunk rotation with activity. Long sit with playing catch with rehab tech x 2 min CGA of PT. To turn to edge of mat to short sit PT moved right leg and patient assisted to move left leg with tech holding under shoe to decrease friction min/mod assist. Pt able to scoot to edge of mat once both legs off CGA/min assist. Checked skin on left leg after leg straps removed and no issues noted.          Therapeutic activity: Pt and wife brought to PT attention that the other day pt was having issues with draining bladder and started to develop a headache. Wife was able to perform cath to drain bladder and patient felt better. PT educated pt and wife on possibility that he was experiencing autonomic dysflexia. They had never hear of this and PT explained that it was a medical emergency and if were to occur it was important that family sat him up immediately and checked to be sure not issue with catheter/bladder and that nothing irritating him on his legs. Important to remove whatever is causing irritation quickly. Sitting up important as blood pressure can  shoot up sky high. Do not lay him down if starts to feel that way. Wife and patient verbalized understanding and wife reported she did sit him up and drain bladder.     PT Short Term Goals - 03/24/19 1215      PT SHORT  TERM GOAL #1   Title  STG=LTGs        PT Long Term Goals - 03/24/19 1217      PT LONG TERM GOAL #1   Title  Patient will direct final HEP independently to wife/caregivers with final HEP (stretching, balance exercises) to maintain ROM and mobility at completion of PT.    Baseline  performing basic stretches in hospital bed    Time  6    Period  Weeks   visits   Status  Revised      PT LONG TERM GOAL #2   Title  Pt will transition from R sidelying or L sidelying to sit edge of mat with min A for intermittent LE management    Baseline  Mod A    Time  6    Period  Weeks    Status  New      PT LONG TERM GOAL #3   Title  Patient will perform long sit/circle without UE support >/= 5 minutes with min guard for improved ability to assist with ADLs in bed.    Baseline  10 minutes with intermittent lifting UE but requires support from therapist behind him    Time  6    Period  Weeks    Status  Revised      PT LONG TERM GOAL #4   Title  Pt will transition from sidelying <> long sitting/circle sitting with min A of one person    Baseline  +2 assistance currently    Time  6    Period  Weeks    Status  New      PT LONG TERM GOAL #5   Title  Patient will perform lateral scoot transfer on sliding board from w/c <> mat table on unlevel surface with minA/mod assist (not including placement of board)    Baseline  Min A level surface    Time  6    Period  Weeks    Status  Revised            Plan - 04/15/19 1319    Clinical Impression Statement  Pt continues to be able to perform slideboard transfers with less assistance once on board. Pt enjoyed utilizing leg strap on left leg to aide in mobility. Pt showing more trunk movement with long sitting activities.     Rehab Potential  Good    PT Frequency  1x / week    PT Duration  6 weeks    PT Treatment/Interventions  ADLs/Self Care Home Management;DME Instruction;Functional mobility training;Therapeutic activities;Therapeutic exercise;Balance training;Neuromuscular re-education;Wheelchair mobility training;Patient/family education;Passive range of motion;Electrical Stimulation;Orthotic Fit/Training;Manual techniques;Dry needling;Energy conservation    PT Next Visit Plan  Did he try stretches in queen bed at home? Continue use of leg straps for mobility. SCI FIT with belt around each foot to support on pedal.  Slideboard transfers progress from level to uneven transfers.  Bed mobility: pushing up to sitting - long sitting, circle sitting with dynamic reaching. Short Sitting balance A/P and M/L, range of motion hips and shoulders, UE strengthening.  Management of LE during transfers and bed mobility. Sitting balance without UE support & dynamic reaching.  Standing when we get standing frame    Consulted and Agree with Plan of Care  Patient;Family member/caregiver       Patient will benefit from skilled therapeutic intervention in order to improve the following deficits and impairments:  Decreased activity tolerance, Decreased balance, Decreased mobility, Decreased endurance, Decreased range of  motion, Decreased strength, Impaired flexibility, Impaired sensation, Impaired UE functional use, Pain, Impaired tone, Postural dysfunction  Visit Diagnosis: Muscle weakness (generalized)     Problem List Patient Active Problem List   Diagnosis Date Noted  . Spasticity 03/08/2019  . Erectile dysfunction due to diseases classified elsewhere 03/08/2019  . 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 Neurogenic bowel 01/22/2019  . Neurogenic bladder 01/22/2019  . Myofascial pain 01/22/2019  . 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 Quadriplegia (HCC) 12/30/2018    Ronn Melena , PT, DPT, NCS 04/15/2019,  1:21 PM  [place] [facility] [address] [place], [place], [address] Phone: [phone]   Fax:  [phone]  Name: [name] MRN: [id number] Date of Birth: 21-Apr-1965

## 2019-04-22 ENCOUNTER — Ambulatory Visit: Payer: Medicaid Other | Admitting: Physical Therapy

## 2019-04-22 ENCOUNTER — Other Ambulatory Visit: Payer: Self-pay

## 2019-04-22 DIAGNOSIS — M6281 Muscle weakness (generalized): Secondary | ICD-10-CM

## 2019-04-22 DIAGNOSIS — R293 Abnormal posture: Secondary | ICD-10-CM

## 2019-04-22 DIAGNOSIS — G8252 Quadriplegia, C1-C4 incomplete: Secondary | ICD-10-CM

## 2019-04-22 DIAGNOSIS — M542 Cervicalgia: Secondary | ICD-10-CM

## 2019-04-22 DIAGNOSIS — R208 Other disturbances of skin sensation: Secondary | ICD-10-CM

## 2019-04-22 DIAGNOSIS — R29818 Other symptoms and signs involving the nervous system: Secondary | ICD-10-CM

## 2019-04-22 NOTE — Therapy (Signed)
[facility] [address] [place], [place], [address] Phone: [phone]   Fax:  [phone]  Physical Therapy Treatment  Patient Details  Name: [name] MRN: [id number] Date of Birth: 05/22/1965 Referring Provider (PT): Lanny Cramp, MD - referring physician.  Patient to have care transferred to [facility].  New PCP: Grayce Sessions, NP   Encounter Date: 04/22/2019  PT End of Session - 04/22/19 1759    Visit Number  9    Number of Visits  12   total visit count for 2021   Date for PT Re-Evaluation  05/16/19    Authorization Type  Medicaid    Authorization Time Period  6 visits: 02/17/19-03/30/19, 6 visits 2/22-05/16/19    Authorization - Visit Number  3    Authorization - Number of Visits  6    PT Start Time  0850    PT Stop Time  0940    PT Time Calculation (min)  50 min    Equipment Utilized During Treatment  Other (comment)   interpreter services utilized   Activity Tolerance  Patient tolerated treatment well    Behavior During Therapy  [facility] for tasks assessed/performed       Past Medical History:  Diagnosis Date  . Diabetes mellitus without complication (HCC)   . History of cervical fracture     Past Surgical History:  Procedure Laterality Date  . CERVICAL SPINE SURGERY      There were no vitals filed for this visit.  Subjective Assessment - 04/22/19 1435    Subjective  No further issues with bladder or BP; wife has letters from [facility] stating they are going to deny the rental payment for the hospital bed and the hoyer lift.  Also states that the controls for the hospital bed do not work now.    Patient is accompained by:  Family member    Pertinent History  C1 burst fx with [place] transection s/p C4-T6 spinal fusion (6/10), incomplete quadriplegia, previous episodes of autonomic dysreflexia in inpatient rehab    Patient Stated Goals  wants to do exercises as soon as he can, wants to be able to  feel his legs again if he can.    Currently in Pain?  No/denies                       [facility] Adult PT Treatment/Exercise - 04/22/19 1436      Bed Mobility   Sitting - Scoot to Edge of Bed  Minimal Assistance - Patient > 75%;Moderate Assistance - Patient 50-74%   with use of leg loops     Transfers   Transfers  Lateral/Scoot Transfers    Lateral/Scoot Transfers  4: Min assist    Lateral/Scoot Transfer Details (indicate cue type and reason)  Performed slideboard transfers w/c <> mat with pt performing all w/c set up.  Use of leg loops to allow pt to assist with his LE to allow therapist to place board.  Also during transfer, practiced using loops to move and reposition LE - min A to balance while moving LE.      Comments  Once seated on mat utilized leg loops to transition from short sitting to long sitting on mat with pt lifting LLE while leaning to R and therapist assisted with RLE.  Following mat exercise performed traning with leg loops to lift and rotate LE and pelvis 90 deg to L and then 180 deg back to R to  edge of mat.  Cues for lateral leaning while moving contralateral LE with min A.        Dynamic Sitting Balance   Dynamic Sitting - Balance Support  Left upper extremity supported;Right upper extremity supported    Dynamic Sitting - Level of Assistance  4: Min assist;3: Mod assist    Dynamic Sitting Balance - Compensations  Pt able to maintain long sitting with LUE holding leg loop.  Performed functional reaching outside long sitting BOS to reach for, lift and place 2lb weight on various places on mat - contralateral UE provided support and balance with leg loop.  Also performed reaching up and out of BOS to hand 2lb weight to therapist or tech to R, L and forwards without placing it down on the mat.    Dynamic Sitting - Balance Activities  Lateral lean/weight shifting;Forward lean/weight shifting;Reaching for objects;Reaching for weighted objects;Reaching across midline       Therapeutic Activites    Therapeutic Activities  Other Therapeutic Activities    Other Therapeutic Activities  Wife brought two letters from [facility] indicating a denial to pay rental of hoyer lift and hospital bed.  Stating they needed a face to face not with physician documenting necessity for coverage of hoyer lift.  Also required documentation stating specific need for hospital bed with electric controls.  Discussed with pt and wife that they can appeal the denial; will reach out to physician and case worker to assist with appeal.             PT Education - 04/22/19 1758    Education Details  Use of leg loops for transfers, attempted to explain why medicaid has denied equipment but can appeal - will reach out to physician and case worker    Person(s) Educated  Patient;Spouse    Methods  Explanation    Comprehension  Verbalized understanding;Need further instruction       PT Short Term Goals - 03/24/19 1215      PT SHORT TERM GOAL #1   Title  STG=LTGs        PT Long Term Goals - 03/24/19 1217      PT LONG TERM GOAL #1   Title  Patient will direct final HEP independently to wife/caregivers with final HEP (stretching, balance exercises) to maintain ROM and mobility at completion of PT.    Baseline  performing basic stretches in hospital bed    Time  6    Period  Weeks   visits   Status  Revised      PT LONG TERM GOAL #2   Title  Pt will transition from R sidelying or L sidelying to sit edge of mat with min A for intermittent LE management    Baseline  Mod A    Time  6    Period  Weeks    Status  New      PT LONG TERM GOAL #3   Title  Patient will perform long sit/circle without UE support >/= 5 minutes with min guard for improved ability to assist with ADLs in bed.    Baseline  10 minutes with intermittent lifting UE but requires support from therapist behind him    Time  6    Period  Weeks    Status  Revised      PT LONG TERM GOAL #4   Title  Pt will  transition from sidelying <> long sitting/circle sitting with min A of one person  Baseline  +2 assistance currently    Time  6    Period  Weeks    Status  New      PT LONG TERM GOAL #5   Title  Patient will perform lateral scoot transfer on sliding board from w/c <> mat table on unlevel surface with minA/mod assist (not including placement of board)    Baseline  Min A level surface    Time  6    Period  Weeks    Status  Revised            Plan - 04/22/19 1800    Clinical Impression Statement  Pt continues to make progress with slideboard transfers, sitting balance and bed mobility and is improving in his ability to utilize leg loops to increase independence with transfers.  Pt and wife concerned about Medicaid denial of equipment - PT to reach out to MD and case worker for required paperwork needed for coverage of DME.  Continued to discuss bed situation - pt and wife state they are looking at purchasing a bed where the [facility] raises so that pt can return to regular bed; wife took pictures of leg loop package to see if they can find a pair that are affordable.  Will continue to progress towards LTG.    Comorbidities  T2DM    Rehab Potential  Good    PT Frequency  1x / week    PT Duration  6 weeks    PT Treatment/Interventions  ADLs/Self Care Home Management;DME Instruction;Functional mobility training;Therapeutic activities;Therapeutic exercise;Balance training;Neuromuscular re-education;Wheelchair mobility training;Patient/family education;Passive range of motion;Electrical Stimulation;Orthotic Fit/Training;Manual techniques;Dry needling;Energy conservation    PT Next Visit Plan  Continue use of leg straps for mobility. SCI FIT with belt around each foot to support on pedal.  Slideboard transfers progress from level to uneven transfers.  Bed mobility: pushing up to sitting - long sitting, circle sitting with dynamic reaching. Short Sitting balance A/P and M/L, range of motion hips and  shoulders, UE strengthening.  Management of LE during transfers and bed mobility. Sitting balance without UE support & dynamic reaching.  Standing when we get standing frame    Recommended Other Services  Have reached out to physician and case worker about Medicaid denials.  Find affordable leg loops for pt to purchase?    Consulted and Agree with Plan of Care  Patient;Family member/caregiver       Patient will benefit from skilled therapeutic intervention in order to improve the following deficits and impairments:  Decreased activity tolerance, Decreased balance, Decreased mobility, Decreased endurance, Decreased range of motion, Decreased strength, Impaired flexibility, Impaired sensation, Impaired UE functional use, Pain, Impaired tone, Postural dysfunction  Visit Diagnosis: Muscle weakness (generalized)  Abnormal posture  Other disturbances of skin sensation  Cervicalgia  Quadriplegia, C1-C4 incomplete (HCC)  Other symptoms and signs involving the nervous system     Problem List Patient Active Problem List   Diagnosis Date Noted  . Spasticity 03/08/2019  . Erectile dysfunction due to diseases classified elsewhere 03/08/2019  . 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 Neurogenic bowel 01/22/2019  . Neurogenic bladder 01/22/2019  . Myofascial pain 01/22/2019  . 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 Quadriplegia (HCC) 12/30/2018    Dierdre Highman, PT, DPT 04/22/19    6:11 PM    [facility] [address] [place], [place], [address] Phone: [phone]   Fax:  [phone]  Name: [name] MRN:  [id number] Date of Birth: 08/29/1965

## 2019-04-23 ENCOUNTER — Telehealth: Payer: Self-pay | Admitting: Physical Therapy

## 2019-04-23 NOTE — Telephone Encounter (Signed)
Hi Jasmine, I am one of the physical therapists that has been working with [name].  At yesterday's session his wife showed me two documents they received from Medicaid asking me to help them understand what it said (it was all in [place]).  With the help of the interpreter I explained that the letters from [facility] were informing them of a denial of coverage for the hydraulic hoyer lift they use for transfers and his hospital bed.  They were of course very concerned about this - they were concerned about the equipment being taken away or being charged for it monthly.  The letter does give an option to appeal the denial.  I was wondering if appealing the denial was something you could help them complete?  I made a copy of the letters that I can fax to you if you would like to review them.  It sounds like the reason for the denial is lack of physician paperwork documenting medical necessity and I'm not sure if a letter from his current physicians would suffice or does it need to come from the physiatrist that ordered the DME when he was in inpatient rehab?  Any assistance you can provide would be greatly appreciated!  Thanks! Dierdre Highman, PT, DPT 04/23/19    3:57 PM

## 2019-04-28 ENCOUNTER — Other Ambulatory Visit: Payer: Self-pay

## 2019-04-28 ENCOUNTER — Ambulatory Visit: Payer: Medicaid Other

## 2019-04-28 DIAGNOSIS — M6281 Muscle weakness (generalized): Secondary | ICD-10-CM | POA: Diagnosis not present

## 2019-04-28 NOTE — Therapy (Signed)
[facility] [address] [place], [place], [address] Phone: [phone]   Fax:  [phone]  Physical Therapy Treatment  Patient Details  Name: [name] MRN: [id number] Date of Birth: April 09, 1965 Referring Provider (PT): Doran Clay, MD - referring physician.  Patient to have care transferred to [facility].  New PCP: Kerin Perna, NP   Encounter Date: 04/28/2019  PT End of Session - 04/28/19 1152    Visit Number  10    Number of Visits  12   total visit count for 2021   Date for PT Re-Evaluation  05/16/19    Authorization Type  Medicaid    Authorization Time Period  6 visits: 02/17/19-03/30/19, 6 visits 2/22-05/16/19    Authorization - Visit Number  4    Authorization - Number of Visits  6    PT Start Time  1150    PT Stop Time  1231    PT Time Calculation (min)  41 min    Equipment Utilized During Treatment  Other (comment)   interpreter services utilized   Activity Tolerance  Patient tolerated treatment well    Behavior During Therapy  [facility] for tasks assessed/performed       Past Medical History:  Diagnosis Date  . Diabetes mellitus without complication ([place])   . History of cervical fracture     Past Surgical History:  Procedure Laterality Date  . CERVICAL SPINE SURGERY      There were no vitals filed for this visit.  Subjective Assessment - 04/28/19 1152    Subjective  Pt reports doing good. Asking about at Motorized Dual Hand and Foot Recovery exercise bike from [facility] to have for home. PT let him know she would check out further for him as needs to allow either arms to control legs or motorized legs.    Patient is accompained by:  Family member    Pertinent History  C1 burst fx with [place] transection s/p C4-T6 spinal fusion (6/10), incomplete quadriplegia, previous episodes of autonomic dysreflexia in inpatient rehab    Patient Stated Goals  wants to do exercises as soon as  he can, wants to be able to feel his legs again if he can.    Currently in Pain?  No/denies                       [facility] Adult PT Treatment/Exercise - 04/28/19 1230      Bed Mobility   Bed Mobility  Rolling Right;Rolling Left;Sit to Supine;Sitting - Scoot to Edge of Bed   used arms for momentum with rolling   Rolling Right  Minimal Assistance - Patient > 75%   x 2 with PT positioning leg in hooklying first   Rolling Left  Minimal Assistance - Patient > 75%   x 2 with PT positioning leg in hooklying first     Transfers   Transfers  Lateral/Scoot Transfers    Lateral/Scoot Transfers  4: Min assist    Lateral/Scoot Transfer Details (indicate cue type and reason)  Slideboard transfer w/c to mat min/mod assist to get started then min assist with PT blocking at knees to prevent coming too far forward. Pt utilized leg straps to assist with leg movement min assist. Pt was able to utilize leg strap to help lift leg while therapist placed board. With return min assist throughout. Pt was given cues to lean head away from where he wanted hips to go.  Comments  Once sitting edge of mat utilized leg loops to assist with sit to supine needing only min assist on left leg using leg loop and max assist right leg. Tech at trunk for safety.      Therapeutic Activites    Therapeutic Activities  Other Therapeutic Activities    Other Therapeutic Activities  Sitting edge of mat pt performed trunk rotation reaching across body for targets x 3 each side as reporting he was feeling stiff today. Supine to long sit mod assist +2 with patient holding PT arm and tech arm. Pt able to complete movement grabbing to leg straps to get fully upright. In long sit pt utilized leg straps to help stretch out hamstring leaning forward for 2 min. Pt then performed in long sit tossing 1kg med ball with rehab tech and PT guarding with CGA at times x 2 min then with 2kg ball x 2 min. Reaching for 1kg ball out to side  across body and propping on same side arm in some extension x 3 each side. Pt demonstrated more shoulder extension with activity and able to correct LOB with less assist mostly CGA.  Utilizing leg loops to turn to edge of mat from long sit mod assist of therapist at times then scooting forward once legs off mat CGA.             PT Education - 04/28/19 1518    Education Details  Pt to continue with current HEP    Person(s) Educated  Patient    Methods  Explanation    Comprehension  Verbalized understanding       PT Short Term Goals - 03/24/19 1215      PT SHORT TERM GOAL #1   Title  STG=LTGs        PT Long Term Goals - 03/24/19 1217      PT LONG TERM GOAL #1   Title  Patient will direct final HEP independently to wife/caregivers with final HEP (stretching, balance exercises) to maintain ROM and mobility at completion of PT.    Baseline  performing basic stretches in hospital bed    Time  6    Period  Weeks   visits   Status  Revised      PT LONG TERM GOAL #2   Title  Pt will transition from R sidelying or L sidelying to sit edge of mat with min A for intermittent LE management    Baseline  Mod A    Time  6    Period  Weeks    Status  New      PT LONG TERM GOAL #3   Title  Patient will perform long sit/circle without UE support >/= 5 minutes with min guard for improved ability to assist with ADLs in bed.    Baseline  10 minutes with intermittent lifting UE but requires support from therapist behind him    Time  6    Period  Weeks    Status  Revised      PT LONG TERM GOAL #4   Title  Pt will transition from sidelying <> long sitting/circle sitting with min A of one person    Baseline  +2 assistance currently    Time  6    Period  Weeks    Status  New      PT LONG TERM GOAL #5   Title  Patient will perform lateral scoot transfer on sliding board from w/c <> mat table  on unlevel surface with minA/mod assist (not including placement of board)    Baseline  Min A  level surface    Time  6    Period  Weeks    Status  Revised            Plan - 04/28/19 1518    Clinical Impression Statement  Pt continues to benefit from use of leg straps to assist with mobility in long sit and with helping to move legs more with transfers. Pt was able to demonstrate more shoulder extension today in long sit to utilize to help with balance.    Comorbidities  T2DM    Rehab Potential  Good    PT Frequency  1x / week    PT Duration  6 weeks    PT Treatment/Interventions  ADLs/Self Care Home Management;DME Instruction;Functional mobility training;Therapeutic activities;Therapeutic exercise;Balance training;Neuromuscular re-education;Wheelchair mobility training;Patient/family education;Passive range of motion;Electrical Stimulation;Orthotic Fit/Training;Manual techniques;Dry needling;Energy conservation    PT Next Visit Plan  Pt asking about motorized hand and foot bike for home. Give any info we found. Continue use of leg straps for mobility. SCI FIT with belt around each foot to support on pedal.  Slideboard transfers progress from level to uneven transfers.  Bed mobility: pushing up to sitting - long sitting, circle sitting with dynamic reaching. Short Sitting balance A/P and M/L, range of motion hips and shoulders, UE strengthening.  Management of LE during transfers and bed mobility. Sitting balance without UE support & dynamic reaching.  Standing when we get standing frame    Consulted and Agree with Plan of Care  Patient;Family member/caregiver       Patient will benefit from skilled therapeutic intervention in order to improve the following deficits and impairments:  Decreased activity tolerance, Decreased balance, Decreased mobility, Decreased endurance, Decreased range of motion, Decreased strength, Impaired flexibility, Impaired sensation, Impaired UE functional use, Pain, Impaired tone, Postural dysfunction  Visit Diagnosis: Muscle weakness  (generalized)     Problem List Patient Active Problem List   Diagnosis Date Noted  . Spasticity 03/08/2019  . Erectile dysfunction due to diseases classified elsewhere 03/08/2019  . 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 Neurogenic bowel 01/22/2019  . Neurogenic bladder 01/22/2019  . Myofascial pain 01/22/2019  . 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 Quadriplegia (HCC) 12/30/2018    Ronn Melena, PT, DPT, NCS 04/28/2019, 3:22 PM  [facility] [address] [place], [place], [address] Phone: [phone]   Fax:  [phone]  Name: [name] MRN: [id number] Date of Birth: 1965/10/16

## 2019-05-02 NOTE — Telephone Encounter (Signed)
Hello [name], I just wanted to follow up on my previous message regarding [facility] and the [facility] equipment denials.   Again, I can send/fax you the denial letter they received from Medicaid if you would like to look over it.  Do you feel this is something you can assist them with?  Please let me know what questions you have or if I can assist in any way.  Thank you, Dierdre Highman, PT, DPT 05/02/19    5:31 PM

## 2019-05-03 ENCOUNTER — Telehealth (INDEPENDENT_AMBULATORY_CARE_PROVIDER_SITE_OTHER): Payer: Self-pay

## 2019-05-03 NOTE — Telephone Encounter (Signed)
Patient wife called and wanted to know if PCP could place an order   her husband to [facility] states she called to schedule an appointment and was advice that they need a order to allow him to schedule an appointment.  Please send referral if appropriate  Please advice [phone]

## 2019-05-04 ENCOUNTER — Telehealth: Payer: Self-pay | Admitting: Physical Therapy

## 2019-05-04 NOTE — Telephone Encounter (Signed)
Hello Bufford Lope,   My apologies for the delayed response. Yes, I would like for you to fax the Medicaid denial letter to me for review at [phone]. I have also added our RN CM, Robyne Peers who provides assistance with DME. Thanks!

## 2019-05-04 NOTE — Telephone Encounter (Signed)
Sent to PCP ?

## 2019-05-04 NOTE — Telephone Encounter (Signed)
Unable to reach Bridgett Larsson, LCSW via in basket message.  Contacted Child psychotherapist by phone today; left voicemail and requested a return call at her earliest convenience to discuss Medicaid denial of DME.  Dierdre Highman, PT, DPT 05/04/19    3:52 PM

## 2019-05-05 ENCOUNTER — Encounter: Payer: Self-pay | Admitting: Physical Therapy

## 2019-05-05 ENCOUNTER — Ambulatory Visit: Payer: Medicaid Other | Admitting: Physical Therapy

## 2019-05-05 ENCOUNTER — Other Ambulatory Visit: Payer: Self-pay

## 2019-05-05 DIAGNOSIS — M542 Cervicalgia: Secondary | ICD-10-CM

## 2019-05-05 DIAGNOSIS — R29818 Other symptoms and signs involving the nervous system: Secondary | ICD-10-CM

## 2019-05-05 DIAGNOSIS — R208 Other disturbances of skin sensation: Secondary | ICD-10-CM

## 2019-05-05 DIAGNOSIS — R293 Abnormal posture: Secondary | ICD-10-CM

## 2019-05-05 DIAGNOSIS — G8252 Quadriplegia, C1-C4 incomplete: Secondary | ICD-10-CM

## 2019-05-05 DIAGNOSIS — M6281 Muscle weakness (generalized): Secondary | ICD-10-CM

## 2019-05-05 NOTE — Therapy (Signed)
[facility] [address] [place], [place], [address] Phone: [phone]   Fax:  [phone]  Physical Therapy Treatment  Patient Details  Name: [name] MRN: [id number] Date of Birth: [birth year] Referring Provider (PT): Lanny Cramp, MD - referring physician.  Patient to have care transferred to [facility].  New PCP: Grayce Sessions, NP   Encounter Date: 05/05/2019  PT End of Session - 05/05/19 1643    Visit Number  11    Number of Visits  12   total visit count for 2021   Date for PT Re-Evaluation  05/16/19    Authorization Type  Medicaid    Authorization Time Period  6 visits: 02/17/19-03/30/19, 6 visits 2/22-05/16/19    Authorization - Visit Number  5    Authorization - Number of Visits  6    PT Start Time  1020    PT Stop Time  1100    PT Time Calculation (min)  40 min    Equipment Utilized During Treatment  Other (comment)   interpreter services utilized   Activity Tolerance  Patient tolerated treatment well    Behavior During Therapy  [name] for tasks assessed/performed       Past Medical History:  Diagnosis Date  . Diabetes mellitus without complication (HCC)   . History of cervical fracture     Past Surgical History:  Procedure Laterality Date  . CERVICAL SPINE SURGERY      There were no vitals filed for this visit.  Subjective Assessment - 05/05/19 1232    Subjective  Alerted pt that therapy has reached out to social worker about Medicaid denial and appeal.  Pt has not looked into the price of leg loops.  Is still asking about Dual Hand and Foot Recovery exercise health - not sure if it helps moves the legs or not.  Pt is still using hoyer lift at home because of how uneven the hospital bed is, may want to progress to using the slideboard only.  No issues to report.    Patient is accompained by:  Family member    Pertinent History  C1 burst fx with [place] transection s/p C4-T6 spinal fusion (6/10),  incomplete quadriplegia, previous episodes of autonomic dysreflexia in inpatient rehab    Patient Stated Goals  wants to do exercises as soon as he can, wants to be able to feel his legs again if he can.    Currently in Pain?  No/denies                       [facility] Adult PT Treatment/Exercise - 05/05/19 1631      Transfers   Transfers  Lateral/Scoot Transfers    Lateral/Scoot Transfers  From elevated surface;With slide board    Lateral Transfers: Patient Percentage  70%    Lateral/Scoot Transfer Details (indicate cue type and reason)  Discussed patient transfers at home; [place] using hoyer lift due to uneven surface of hospital bed - pt may want to progress to using sliding board only at home and would need family training.  Practiced uneven transfers today going downhill to mat and then uphill to wheelchair.  Performed x 2 sets - transferring downhill to mat pt required min-mod A to prevent sliding forwards off the board and for balance when lifting and repositioning feet with leg loops.  Cues for use of head hips relationship to prevent tipping to L.  When trasferring back uphill pt required increased assistance  to maintain balance during forward lean to off weight hips and increased assistance to reposition feet.        Therapeutic Activites    Therapeutic Activities  Other Therapeutic Activities    Other Therapeutic Activities  Discussed Medicaid denial paperwork, waiting to hear back from social worker about best way to appeal.  Will reach out to social worker at [place] if needed.  Pt to look up price of leg loops and use for Hand and Foot recovery exercise machine.        Knee/Hip Exercises: Aerobic   Other Aerobic  SCI Fit stepper seated in w/c with feet strapped to foot plates pt performed 15 minutes with bilat UE at 1.5 resistance.  Therapist only present first 5 minutes and then pt left in supervision of tech.               PT Short Term Goals - 03/24/19 1215       PT SHORT TERM GOAL #1   Title  STG=LTGs        PT Long Term Goals - 03/24/19 1217      PT LONG TERM GOAL #1   Title  Patient will direct final HEP independently to wife/caregivers with final HEP (stretching, balance exercises) to maintain ROM and mobility at completion of PT.    Baseline  performing basic stretches in hospital bed    Time  6    Period  Weeks   visits   Status  Revised      PT LONG TERM GOAL #2   Title  Pt will transition from R sidelying or L sidelying to sit edge of mat with min A for intermittent LE management    Baseline  Mod A    Time  6    Period  Weeks    Status  New      PT LONG TERM GOAL #3   Title  Patient will perform long sit/circle without UE support >/= 5 minutes with min guard for improved ability to assist with ADLs in bed.    Baseline  10 minutes with intermittent lifting UE but requires support from therapist behind him    Time  6    Period  Weeks    Status  Revised      PT LONG TERM GOAL #4   Title  Pt will transition from sidelying <> long sitting/circle sitting with min A of one person    Baseline  +2 assistance currently    Time  6    Period  Weeks    Status  New      PT LONG TERM GOAL #5   Title  Patient will perform lateral scoot transfer on sliding board from w/c <> mat table on unlevel surface with minA/mod assist (not including placement of board)    Baseline  Min A level surface    Time  6    Period  Weeks    Status  Revised            Plan - 05/05/19 1643    Clinical Impression Statement  Continued slideboard training with use of leg loops to assist with lifting leg to place or remove slideboard and to reposition feet during transfer.  Practiced uneven transfers downhill and uphill with pt requiring greater assistance to initiate transfer, for balance and foot placement when going uphill back into wheelchair.  Pt set up on SciFit at end for UE strengthening and aerobic conditioning but therapist  not present for  full 15 minutes.  Will perform re-assessment next session and will determine if pt will benefit from further visits or if pt has reached goal level if pt prefers to continue to use hoyer lift with hospital bed.    Comorbidities  T2DM    Rehab Potential  Good    PT Frequency  1x / week    PT Duration  6 weeks    PT Treatment/Interventions  ADLs/Self Care Home Management;DME Instruction;Functional mobility training;Therapeutic activities;Therapeutic exercise;Balance training;Neuromuscular re-education;Wheelchair mobility training;Patient/family education;Passive range of motion;Electrical Stimulation;Orthotic Fit/Training;Manual techniques;Dry needling;Energy conservation    PT Next Visit Plan  Any information about price of leg loops and arm/foot exerciser.  Any information about Medicaid appeal?  Check LTG and decide if pt would benefit from more visits (limited by home equipment).  Sci Fit at end    [employer] and Agree with Plan of Care  Patient;Family member/caregiver       Patient will benefit from skilled therapeutic intervention in order to improve the following deficits and impairments:  Decreased activity tolerance, Decreased balance, Decreased mobility, Decreased endurance, Decreased range of motion, Decreased strength, Impaired flexibility, Impaired sensation, Impaired UE functional use, Pain, Impaired tone, Postural dysfunction  Visit Diagnosis: Muscle weakness (generalized)  Abnormal posture  Other disturbances of skin sensation  Quadriplegia, C1-C4 incomplete (HCC)  Cervicalgia  Other symptoms and signs involving the nervous system     Problem List Patient Active Problem List   Diagnosis Date Noted  . Spasticity 03/08/2019  . Erectile dysfunction due to diseases classified elsewhere 03/08/2019  . 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 Neurogenic bowel 01/22/2019  . Neurogenic bladder 01/22/2019  . Myofascial pain 01/22/2019  . Spinal cord injury at T1-T6 level (HCC)  12/30/2018  . Quadriplegia (HCC) 12/30/2018    Dierdre Highman, PT, DPT 05/05/19    4:48 PM    [facility] [address] [place], [place], [address] Phone: [phone]   Fax:  [phone]  Name: [name] MRN: [id number] Date of Birth: 1966/01/07

## 2019-05-05 NOTE — Telephone Encounter (Signed)
Front office called [facility] [phone] to get information in regards to scheduling an appointment to have Mr.Haslem's IVC Filter removal. I was given the Interventional Radiology Departments phone number [phone]. I spoke to Tobi Bastos in interventional radiology department and she stated we would need an order for removal. Tobi Bastos stated that on the order if we can advice if patient is on any blood thinners since they would need to do imaging.   Patient wife is aware and would like for the order to be sent [facility] interventional radiology.    Fax Number is [phone]  Please send order if appropriate

## 2019-05-05 NOTE — Telephone Encounter (Signed)
Sent to PCP ?

## 2019-05-07 ENCOUNTER — Other Ambulatory Visit: Payer: Self-pay | Admitting: Nurse Practitioner

## 2019-05-10 ENCOUNTER — Other Ambulatory Visit (INDEPENDENT_AMBULATORY_CARE_PROVIDER_SITE_OTHER): Payer: Self-pay | Admitting: Primary Care

## 2019-05-10 DIAGNOSIS — Z95828 Presence of other vascular implants and grafts: Secondary | ICD-10-CM

## 2019-05-10 NOTE — Telephone Encounter (Signed)
Hello All  I can not give orders to another facility. Order placed for vein vascular.

## 2019-05-12 ENCOUNTER — Other Ambulatory Visit: Payer: Self-pay

## 2019-05-12 ENCOUNTER — Ambulatory Visit: Payer: Medicaid Other | Admitting: Physical Therapy

## 2019-05-12 ENCOUNTER — Other Ambulatory Visit (INDEPENDENT_AMBULATORY_CARE_PROVIDER_SITE_OTHER): Payer: Self-pay | Admitting: Primary Care

## 2019-05-12 DIAGNOSIS — M542 Cervicalgia: Secondary | ICD-10-CM

## 2019-05-12 DIAGNOSIS — R29818 Other symptoms and signs involving the nervous system: Secondary | ICD-10-CM

## 2019-05-12 DIAGNOSIS — M6281 Muscle weakness (generalized): Secondary | ICD-10-CM

## 2019-05-12 DIAGNOSIS — G8252 Quadriplegia, C1-C4 incomplete: Secondary | ICD-10-CM

## 2019-05-12 DIAGNOSIS — R208 Other disturbances of skin sensation: Secondary | ICD-10-CM

## 2019-05-12 DIAGNOSIS — R293 Abnormal posture: Secondary | ICD-10-CM

## 2019-05-12 NOTE — Telephone Encounter (Signed)
Sent to PCP ?

## 2019-05-12 NOTE — Therapy (Signed)
[place] [address] [place], [place], [address] Phone: [phone]   Fax:  [phone]  Physical Therapy Treatment and D/C Summary  Patient Details  Name: [name] MRN: [id number] Date of Birth: 08/14/1965 Referring Provider (PT): Doran Clay, MD - referring physician.  Patient to have care transferred to [facility].  New PCP: Kerin Perna, NP   Encounter Date: 05/12/2019  PT End of Session - 05/12/19 1146    Visit Number  12    Number of Visits  12   total visit count for 2021   Date for PT Re-Evaluation  05/16/19    Authorization Type  Medicaid    Authorization Time Period  6 visits: 02/17/19-03/30/19, 6 visits 2/22-05/16/19    Authorization - Visit Number  6    Authorization - Number of Visits  6    PT Start Time  1015    PT Stop Time  1115    PT Time Calculation (min)  60 min    Equipment Utilized During Treatment  Other (comment)   interpreter services utilized, leg loops   Activity Tolerance  Patient tolerated treatment well    Behavior During Therapy  [facility] for tasks assessed/performed       Past Medical History:  Diagnosis Date  . Diabetes mellitus without complication ([place])   . History of cervical fracture     Past Surgical History:  Procedure Laterality Date  . CERVICAL SPINE SURGERY      There were no vitals filed for this visit.  Subjective Assessment - 05/12/19 1109    Subjective  Alerted pt that Medicaid paperwork will need to be faxed to provider at [facility] for appeal.  Alerted pt and wife that today is patient's last visit and will be taking a break from therapy for a few months.  Discussed hand and foot exerciser that pt had found online.    Patient is accompained by:  Family member    Pertinent History  C1 burst fx with [place] transection s/p C4-T6 spinal fusion (6/10), incomplete quadriplegia, previous episodes of autonomic dysreflexia in inpatient rehab    Patient Stated  Goals  wants to do exercises as soon as he can, wants to be able to feel his legs again if he can.    Currently in Pain?  No/denies         [facility] PT Assessment - 05/12/19 1122      Assessment   Medical Diagnosis  SCI    Referring Provider (PT)  Doran Clay, MD - referring physician.  Patient to have care transferred to [facility].  New PCP: Kerin Perna, NP    Onset Date/Surgical Date  07/22/18    Prior Therapy  PT, OT, SLP inpatient rehab at [facility]      Precautions   Precautions  Fall    Precaution Comments  Has an abdominal binder at home for BP management, has not been wearing it due to fear of pressure sores. Figure 8 wraps for BP when OOB. autonomic dysreflexia (previous episodes at inpatient rehab in [facility]).       Prior Function   Level of Independence  Independent      Observation/Other Assessments   Focus on Therapeutic Outcomes (FOTO)   N/A      Bed Mobility   Bed Mobility  Supine to Sit;Sit to Supine;Sitting - Scoot to Edge of Bed    Supine to Sit  Moderate Assistance - Patient 50-74%  Sitting - Scoot to Edge of Bed  Minimal Assistance - Patient > 75%   use of leg loops to advance LE to edge of mat     Transfers   Transfers  Lateral/Scoot Transfers    Lateral/Scoot Transfers  From elevated surface;With slide board    Lateral Transfers: Patient Percentage  70%    Lateral/Scoot Transfer Details (indicate cue type and reason)  downhill to mat with assistance to place board and to reposition feet with cues and leg loops; cues to tilt head towards w/c to improve control with scooting hips downhill.  When returning to chair uphill pt cued to lean away from w/c and perform scooting while therapist maintained position of LE and assisted with repositioning of feet with leg loops.      Supine to Sit  3: Mod assist    Supine to Sit Details (indicate cue type and reason)  with use of leg loops pt able to perform with one person from reclined position; pt using  leverage of LE to bring trunk upright with therapist assisting in preventing trunk from falling backwards as pt repositioned hands for greater leverage.      Sit to Supine  3: Mod assist    Sit to Supine Details   from sitting edge of mat > supine on wedge; pt utilized leg loops and momentum to transition to supine with therapist assisting pt with lifting both LE onto mat with use of leg loops.      Number of Reps  2 sets                   [facility] Adult PT Treatment/Exercise - 05/12/19 1138      Therapeutic Activites    Therapeutic Activities  Other Therapeutic Activities    Other Therapeutic Activities  Continued discussion with pt and wife about Medicaid paperwork - PT to send to social worker/case manager at [facility] to assist in appeal.  Continued to explain why Medicaid denied equipment and the need for [facility] to provide paperwork documenting diagnosis and medical need for the equipment.  Will continue to provide pt updates about equipment after this appointment.  Also discussed today is final visit for PT and the need to conserve visits for later in the year.  Pt reports he will be going to [place] in the summer and would likely return to therapy after returning from [place].  Discussed that pt will need to follow up with physiatrist to obtain order to return to therapy.  Provided pt with number to physiatrist office to set up follow up appointment.  Pt and wife verbalized understanding.      Knee/Hip Exercises: Aerobic   Other Aerobic  SCI Fit stepper in w/c with feet strapped to foot pedals; performed x 15 minutes at level 1.5 resistance.  While pt performing exercise on Stepper researched the [employer] Hand and Foot Recovery Exercise Bike that pt is interested in buying.  Per videos and description the bike does have a stabilization strap to keep it from moving away from the w/c and has dual motors to assist with UE and LE movement.  Pt and wife to discuss but pt thinks he may  purchase for home to have a way to perform aerobic exercise at home.  Therapist did not make any recommendations, just provided pt with information from website due to information being in [place].             PT Education - 05/12/19 1146  Education Details  see TA    Person(s) Educated  Patient;Spouse    Methods  Explanation    Comprehension  Verbalized understanding       PT Short Term Goals - 03/24/19 1215      PT SHORT TERM GOAL #1   Title  STG=LTGs        PT Long Term Goals - 05/12/19 1147      PT LONG TERM GOAL #1   Title  Patient will direct final HEP independently to wife/caregivers with final HEP (stretching, balance exercises) to maintain ROM and mobility at completion of PT.    Baseline  performing basic stretches in hospital bed    Time  6    Period  Weeks   visits   Status  Achieved      PT LONG TERM GOAL #2   Title  Pt will transition from R sidelying or L sidelying to sit edge of mat with min A for intermittent LE management    Baseline  mod A supine > long sitting; min A long sitting > short sitting edge of mat with leg loops    Time  6    Period  Weeks    Status  Partially Met      PT LONG TERM GOAL #3   Title  Patient will perform long sit/circle without UE support >/= 5 minutes with min guard for improved ability to assist with ADLs in bed.    Baseline  can perform for >10 minutes with use of leg loops and supervision to maintain balance    Time  6    Period  Weeks    Status  Achieved      PT LONG TERM GOAL #4   Title  Pt will transition from sidelying <> long sitting/circle sitting with min A of one person    Baseline  mod A of one person and use of leg loops    Time  6    Period  Weeks    Status  Partially Met      PT LONG TERM GOAL #5   Title  Patient will perform lateral scoot transfer on sliding board from w/c <> mat table on unlevel surface with minA/mod assist (not including placement of board)    Baseline  min-mod A on uneven  surface    Time  6    Period  Weeks    Status  Achieved            Plan - 05/12/19 1149    Clinical Impression Statement  Pt has made good progress and has met 3/5 LTG, partially meeting other two goals.  Introduced leg loops to patient and pt able to utilize to assist with transfers, bed mobility and sitting balance.  Pt progressed to being able to perform level slideboard transfer with min A, unlevel transfers with mod A, sit <> supine with mod A and scooting to edge of mat with min A.  Pt is now able to sit in long sitting with leg loops with supervision and short sitting with UE support with supervision.  Pt is continuing to use hoyer lift at home with family and does not wish to progress to slideboard at this time; his functional independence at home with bed mobility also continues to be limited due to the minimal support the hospital bed provides.  Pt has progressed as much as is necessary for function at home and will D/C from PT at this time.  Pt to continue with exercises and aerobic conditioning at home.  Pt to return to further training later in the year if necessary.  PT to continue to address Medicaid and equipment issues in coordination with [facility].    Comorbidities  T2DM    Rehab Potential  Good    PT Frequency  1x / week    PT Duration  6 weeks    PT Treatment/Interventions  ADLs/Self Care Home Management;DME Instruction;Functional mobility training;Therapeutic activities;Therapeutic exercise;Balance training;Neuromuscular re-education;Wheelchair mobility training;Patient/family education;Passive range of motion;Electrical Stimulation;Orthotic Fit/Training;Manual techniques;Dry needling;Energy conservation    Consulted and Agree with Plan of Care  Patient;Family member/caregiver       Patient will benefit from skilled therapeutic intervention in order to improve the following deficits and impairments:  Decreased activity tolerance, Decreased balance, Decreased mobility,  Decreased endurance, Decreased range of motion, Decreased strength, Impaired flexibility, Impaired sensation, Impaired UE functional use, Pain, Impaired tone, Postural dysfunction  Visit Diagnosis: Other symptoms and signs involving the nervous system  Other disturbances of skin sensation  Muscle weakness (generalized)  Abnormal posture  Quadriplegia, C1-C4 incomplete ([place])  Cervicalgia     Problem List Patient Active Problem List   Diagnosis Date Noted  . Spasticity 03/08/2019  . Erectile dysfunction due to diseases classified elsewhere 03/08/2019  . 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 Neurogenic bowel 01/22/2019  . Neurogenic bladder 01/22/2019  . Myofascial pain 01/22/2019  . Spinal cord injury at T1-T6 level ([place]) 12/30/2018  . Quadriplegia ([place]) 12/30/2018    PHYSICAL THERAPY DISCHARGE SUMMARY  Visits from Start of Care: 12 in 2021  Current functional level related to goals / functional outcomes: See impression statement and LTG achievement above   Remaining deficits: Impaired neck ROM and neck/shoulder, pain, UE weakness, impaired balance   Education / Equipment: HEP, educated on leg loops and seated exercise bike  Plan: Patient agrees to discharge.  Patient goals were partially met. Patient is being discharged due to financial reasons.  ?????  Needing to conserve # of visits for later in the year.   [name], PT, DPT 05/12/19    11:58 AM    [place] [address] [place], [place], [address] Phone: [phone]   Fax:  [phone]  Name: [name] MRN: [id number] Date of Birth: 23-Apr-1965

## 2019-05-27 ENCOUNTER — Other Ambulatory Visit: Payer: Self-pay

## 2019-05-27 ENCOUNTER — Encounter (INDEPENDENT_AMBULATORY_CARE_PROVIDER_SITE_OTHER): Payer: Self-pay | Admitting: Primary Care

## 2019-05-27 ENCOUNTER — Ambulatory Visit (INDEPENDENT_AMBULATORY_CARE_PROVIDER_SITE_OTHER): Payer: Medicaid Other | Admitting: Primary Care

## 2019-05-27 VITALS — BP 93/63 | HR 59 | Temp 97.0°F | Resp 12

## 2019-05-27 DIAGNOSIS — E119 Type 2 diabetes mellitus without complications: Secondary | ICD-10-CM | POA: Diagnosis not present

## 2019-05-27 DIAGNOSIS — G8250 Quadriplegia, unspecified: Secondary | ICD-10-CM | POA: Diagnosis not present

## 2019-05-27 DIAGNOSIS — F329 Major depressive disorder, single episode, unspecified: Secondary | ICD-10-CM | POA: Diagnosis not present

## 2019-05-27 DIAGNOSIS — F32A Depression, unspecified: Secondary | ICD-10-CM

## 2019-05-27 LAB — POCT GLYCOSYLATED HEMOGLOBIN (HGB A1C): Hemoglobin A1C: 9.8 % — AB (ref 4.0–5.6)

## 2019-05-27 LAB — POCT CBG (FASTING - GLUCOSE)-MANUAL ENTRY: Glucose Fasting, POC: 438 mg/dL — AB (ref 70–99)

## 2019-05-27 MED ORDER — GLIPIZIDE 10 MG PO TABS: 10.0000 mg | ORAL_TABLET | Freq: Two times a day (BID) | ORAL | 3 refills | Status: DC

## 2019-05-27 MED ORDER — SIMETHICONE 80 MG PO CHEW: 80.0000 mg | CHEWABLE_TABLET | Freq: Four times a day (QID) | ORAL | 3 refills | Status: DC | PRN

## 2019-05-27 MED ORDER — GABAPENTIN 400 MG PO CAPS: 400.0000 mg | ORAL_CAPSULE | Freq: Three times a day (TID) | ORAL | 3 refills | Status: DC

## 2019-05-27 MED ORDER — FLUOXETINE HCL 20 MG PO CAPS: 10.0000 mg | ORAL_CAPSULE | Freq: Every day | ORAL | 1 refills | Status: DC

## 2019-05-27 MED ORDER — BLOOD GLUCOSE METER KIT: PACK | 0 refills | Status: DC

## 2019-05-27 MED ORDER — APIXABAN 5 MG PO TABS: 5.0000 mg | ORAL_TABLET | Freq: Two times a day (BID) | ORAL | 5 refills | Status: DC

## 2019-05-27 MED ORDER — JANUMET 50-1000 MG PO TABS: 1.0000 | ORAL_TABLET | Freq: Two times a day (BID) | ORAL | 1 refills | Status: DC

## 2019-05-27 NOTE — Patient Instructions (Signed)
Informacin bsica sobre la diabetes Diabetes Basics  La diabetes (diabetes mellitus) es una enfermedad de larga duracin (crnica). Se produce cuando el cuerpo no utiliza correctamente el azcar (glucosa) que se libera de los alimentos despus de comer. La diabetes puede deberse a uno de estos problemas o a ambos:  El pncreas no produce suficiente cantidad de una hormona llamada insulina.  El cuerpo no reacciona de forma normal a la insulina que produce. La insulina permite que ciertos azcares (glucosa) ingresen a las clulas del cuerpo. Esto le proporciona energa. Si tiene diabetes, los azcares no pueden ingresar a las clulas. Esto produce un aumento del nivel de azcar en la sangre (hiperglucemia). Sigue estas instrucciones en tu casa: Cmo se trata la diabetes? Es posible que tenga que administrarse insulina u otros medicamentos para la diabetes todos los das para mantener el nivel de azcar en la sangre equilibrado. Adminstrese los medicamentos para la diabetes todos los das como se lo haya indicado el mdico. Haga una lista de los medicamentos para la diabetes aqu: Medicamentos para la diabetes  Nombre del medicamento: ______________________________ ? Cantidad (dosis): ________________ Hora (a.m./p.m.): _______________ Notas: ___________________________________  Nombre del medicamento: ______________________________ ? Cantidad (dosis): ________________ Hora (a.m./p.m.): _______________ Notas: ___________________________________  Nombre del medicamento: ______________________________ ? Cantidad (dosis): ________________ Hora (a.m./p.m.): _______________ Notas: ___________________________________ Si usa insulina, aprender cmo aplicrsela con inyecciones. Es posible que deba ajustar la cantidad en funcin de los alimentos que coma. Haga una lista de los tipos de insulina que usa aqu: Insulina  Tipo de insulina: ______________________________ ? Cantidad (dosis):  ________________ Hora (a.m./p.m.): _______________ Notas: ___________________________________  Tipo de insulina: ______________________________ ? Cantidad (dosis): ________________ Hora (a.m./p.m.): _______________ Notas: ___________________________________  Tipo de insulina: ______________________________ ? Cantidad (dosis): ________________ Hora (a.m./p.m.): _______________ Notas: ___________________________________  Tipo de insulina: ______________________________ ? Cantidad (dosis): ________________ Hora (a.m./p.m.): _______________ Notas: ___________________________________  Tipo de insulina: ______________________________ ? Cantidad (dosis): ________________ Hora (a.m./p.m.): _______________ Notas: ___________________________________ Cmo me controlo el nivel de azcar en la sangre?  Controle sus niveles de azcar en la sangre con un medidor de glucemia segn las indicaciones del mdico. El mdico fijar los objetivos del tratamiento para usted. Generalmente, los resultados de los niveles de azcar en la sangre deben ser los siguientes:  Antes de las comidas (preprandial): de 80 a 130mg/dl (de 4,4 a 7,2mmol/l).  Despus de las comidas (posprandial): por debajo de 180mg/dl (10mmol/l).  Nivel de A1c: menos del 7%. Anote las veces que se controlar los niveles de azcar en la sangre: Controles de azcar en la sangre  Hora: _______________ Notas: ___________________________________  Hora: _______________ Notas: ___________________________________  Hora: _______________ Notas: ___________________________________  Hora: _______________ Notas: ___________________________________  Hora: _______________ Notas: ___________________________________  Hora: _______________ Notas: ___________________________________  Qu debo saber acerca del nivel bajo de azcar en la sangre? Un nivel bajo de azcar en la sangre se denomina hipoglucemia. Este cuadro ocurre cuando el  nivel de azcar en la sangre es igual o menor que 70mg/dl (3,9mmol/l). Entre los sntomas, se pueden incluir los siguientes:  Sentir: ? Hambre. ? Preocupacin o nervios (ansiedad). ? Sudoracin y piel hmeda. ? Confusin. ? Mareos. ? Somnolencia. ? Ganas de vomitar (nuseas).  Tener: ? Latidos cardacos acelerados. ? Dolor de cabeza. ? Cambios en la visin. ? Hormigueo y falta de sensibilidad (entumecimiento) alrededor de la boca, los labios o la lengua. ? Movimientos espasmdicos que no puede controlar (convulsiones).  Dificultades para hacer lo siguiente: ? Moverse (coordinacin). ? Dormir. ? Desmayos. ? Molestarse con facilidad (irritabilidad). Tratamiento del nivel   bajo de azcar en la sangre Para tratar un nivel bajo de azcar en la sangre, ingiera un alimento o una bebida azucarada de inmediato. Si puede pensar con claridad y tragar de manera segura, siga la regla 15/15, que consiste en lo siguiente:  Consuma 15gramos de un hidrato de carbono de accin rpida (carbohidrato). Hable con su mdico acerca de cunto debera consumir.  Algunos hidratos de carbono de accin rpida son: ? Comprimidos de azcar (pastillas de glucosa). Consuma 3o 4pastillas de glucosa. ? De 6 a 8unidades de caramelos duros. ? De 4 a 6onzas (de 120 a 150ml) de jugo de frutas. ? De 4 a 6onzas (de 120 a 150ml) de refresco comn (no diettico). ? 1 cucharada (15ml) de miel o azcar.  Contrlese el nivel de azcar en la sangre 15minutos despus de ingerir el hidrato de carbono.  Si el nivel de azcar en la sangre todava es igual o menor que 70mg/dl (3,9mmol/l), ingiera nuevamente 15gramos de un hidrato de carbono.  Si el nivel de azcar en la sangre no supera los 70mg/dl (3,9mmol/l) despus de 3intentos, solicite ayuda de inmediato.  Ingiera una comida o una colacin en el transcurso de 1hora despus de que el nivel de azcar en la sangre se haya normalizado. Tratamiento del nivel  muy bajo de azcar en la sangre Si el nivel de azcar en la sangre es igual o menor que 54mg/dl (3mmol/l), significa que est muy bajo (hipoglucemia grave). Esto es una emergencia. No espere a ver si los sntomas desaparecen. Solicite atencin mdica de inmediato. Comunquese con el servicio de emergencias de su localidad (911 en los [place]). No conduzca por sus propios medios hasta el hospital. Preguntas para hacerle al mdico  Es necesario que me rena con un instructor en el cuidado de la diabetes?  Qu equipos necesitar para cuidarme en casa?  Qu medicamentos para la diabetes necesito? Cundo debo tomarlos?  Con qu frecuencia debo controlar mi nivel de azcar en la sangre?  A qu nmero puedo llamar si tengo preguntas?  Cundo es mi prxima cita con el mdico?  Dnde puedo encontrar un grupo de apoyo para las personas con diabetes? Dnde buscar ms informacin  American Diabetes Association (Asociacin Estadounidense de la Diabetes): www.diabetes.org  American Association of Diabetes Educators (Asociacin Estadounidense de Instructores para el Cuidado de la Diabetes): www.diabeteseducator.org/patient-resources Comunquese con un mdico si:  El nivel de azcar en la sangre es igual o mayor que 240mg/dl (13,3mmol/dl) durante 2das seguidos.  Ha estado enfermo o ha tenido fiebre durante 2das o ms y no mejora.  Tiene alguno de estos problemas durante ms de 6horas: ? No puede comer ni beber. ? Siente malestar estomacal (nuseas). ? Vomita. ? Presenta heces lquidas (diarrea). Solicite ayuda inmediatamente si:  El nivel de azcar en la sangre est por debajo de 54mg/dl (3mmol/l).  Se siente confundida.  Tiene dificultad para hacer lo siguiente: ? Pensar con claridad. ? La respiracin. Resumen  La diabetes (diabetes mellitus) es una enfermedad de larga duracin (crnica). Se produce cuando el cuerpo no utiliza correctamente el azcar (glucosa)  que se libera de los alimentos despus de la digestin.  Aplquese la insulina y tome los medicamentos para la diabetes como se lo hayan indicado.  Contrlese el nivel de azcar en la sangre todos los das, con la frecuencia que le hayan indicado.  Concurra a todas las visitas de seguimiento como se lo haya indicado el mdico. Esto es importante. Esta informacin no tiene como fin reemplazar   el consejo del mdico. Asegrese de hacerle al mdico cualquier pregunta que tenga. Document Revised: 03/25/2018 Document Reviewed: 06/06/2017 Elsevier Patient Education  2020 Elsevier Inc.  

## 2019-05-27 NOTE — Progress Notes (Signed)
Established Patient Office Visit  Subjective:  Patient ID: [name], male    DOB: 1965/09/17  Age: 54 y.o. MRN: [id number]  CC: No chief complaint on file.   HPI Mr. 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 He is in today to follow up on diabetes. A1C   Past Medical History:  Diagnosis Date  . Diabetes mellitus without complication ([place])   . History of cervical fracture     Past Surgical History:  Procedure Laterality Date  . CERVICAL SPINE SURGERY      Family History  Problem Relation Age of Onset  . Diabetes Mother   . Diabetes Father     Social History   Socioeconomic History  . Marital status: Married    Spouse name: Not on file  . Number of children: 4  . Years of education: 6th grade  . Highest education level: Not on file  Occupational History  . Occupation: Disabled  Tobacco Use  . Smoking status: Never Smoker  . Smokeless tobacco: Never Used  Substance and Sexual Activity  . Alcohol use: Yes    Comment: social  . Drug use: No  . Sexual activity: Not on file  Other Topics Concern  . Not on file  Social History Narrative   Lives at home with his family.   Right-handed.   No daily use of caffeine.   Social Determinants of Health   Financial Resource Strain:   . Difficulty of Paying Living Expenses:   Food Insecurity:   . Worried About Charity fundraiser in the Last Year:   . Arboriculturist in the Last Year:   Transportation Needs:   . Film/video editor (Medical):   Marland Kitchen Lack of Transportation (Non-Medical):   Physical Activity:   . Days of Exercise per Week:   . Minutes of Exercise per Session:   Stress:   . Feeling of Stress :   Social Connections:   . Frequency of Communication with Friends and Family:   . Frequency of Social Gatherings with Friends and Family:   . Attends Religious Services:   . Active Member of Clubs or  Organizations:   . Attends Archivist Meetings:   Marland Kitchen Marital Status:   Intimate Partner Violence:   . Fear of Current or Ex-Partner:   . Emotionally Abused:   Marland Kitchen Physically Abused:   . Sexually Abused:     Outpatient Medications Prior to Visit  Medication Sig Dispense Refill  . acetaminophen (TYLENOL) 500 MG tablet Take 500 mg by mouth every 6 (six) hours as needed.    . Baclofen 5 MG TABS TAKE 1 TABLET BY MOUTH EVERY 8 HOURS AS NEEDED FOR MUSCLE SPASMS 90 tablet 1  . oxyCODONE (OXY IR/ROXICODONE) 5 MG immediate release tablet TAKE 1 TABLET BY MOUTH EVERY 6 HOURS AS NEEDED FOR SEVERE PAIN 83 tablet 0  . senna (SENOKOT) 8.6 MG tablet Take 8.6 mg by mouth.    Marland Kitchen apixaban (ELIQUIS) 5 MG TABS tablet Take 1 tablet (5 mg total) by mouth 2 (two) times daily. 60 tablet 5  . FLUoxetine (PROZAC) 10 MG capsule Take 1 capsule (10 mg total) by mouth daily. 90 capsule 1  . gabapentin (NEURONTIN) 400 MG capsule Take 1 capsule (400 mg total) by mouth 3 (three) times daily. 90 capsule 3  . simethicone (MYLICON) 80 MG chewable tablet Chew 80 mg  by mouth every 6 (six) hours as needed.     . sildenafil (VIAGRA) 100 MG tablet Take 1 tablet (100 mg total) by mouth daily as needed for erectile dysfunction. (Patient not taking: Reported on 05/27/2019) 10 tablet 1  . bisacodyl (DULCOLAX) 10 MG suppository Place rectally.     No facility-administered medications prior to visit.    No Known Allergies  ROS Review of Systems  Endocrine: Positive for cold intolerance, polydipsia and polyphagia.  Neurological: Positive for weakness.       Spinal cord injury no ROM lower extremity   Psychiatric/Behavioral: Positive for agitation. The patient is nervous/anxious.       Objective:    Physical Exam  Constitutional: He is oriented to person, place, and time. He appears well-developed and well-nourished.  HENT:  Head: Normocephalic.  Cardiovascular: Normal rate and regular rhythm.  Pulmonary/Chest:  Effort normal and breath sounds normal.  Abdominal: Soft. Bowel sounds are normal.  Musculoskeletal:        General: Normal range of motion.     Cervical back: Normal range of motion.  Neurological: He is alert and oriented to person, place, and time.  Skin: Skin is warm and dry.  Psychiatric: He has a normal mood and affect. His behavior is normal. Judgment and thought content normal.    BP 93/63 (BP Location: Left Arm, Patient Position: Sitting, Cuff Size: Large)   Pulse (!) 59   Temp (!) 97 F (36.1 C)   Resp 12   SpO2 95%  Wt Readings from Last 3 Encounters:  No data found for Wt     Health Maintenance Due  Topic Date Due  . URINE MICROALBUMIN  Never done  . HIV Screening  Never done  . COLONOSCOPY  Never done    There are no preventive care reminders to display for this patient.  No results found for: TSH Lab Results  Component Value Date   WBC 8.5 11/26/2018   HGB 12.5 (L) 11/26/2018   HCT 37.9 11/26/2018   MCV 85 11/26/2018   PLT 260 11/26/2018   Lab Results  Component Value Date   NA 140 11/26/2018   K 3.6 11/26/2018   CO2 22 11/26/2018   GLUCOSE 166 (H) 11/26/2018   BUN 10 11/26/2018   CREATININE 0.64 (L) 11/26/2018   BILITOT 0.2 11/26/2018   ALKPHOS 101 11/26/2018   AST 11 11/26/2018   ALT 14 11/26/2018   PROT 6.8 11/26/2018   ALBUMIN 4.0 11/26/2018   CALCIUM 9.5 11/26/2018   ANIONGAP 9 04/24/2015   Lab Results  Component Value Date   CHOL 159 11/26/2018   Lab Results  Component Value Date   HDL 43 11/26/2018   Lab Results  Component Value Date   LDLCALC 88 11/26/2018   Lab Results  Component Value Date   TRIG 164 (H) 11/26/2018   Lab Results  Component Value Date   CHOLHDL 3.7 11/26/2018   Lab Results  Component Value Date   HGBA1C 9.8 (A) 05/27/2019      Assessment & Plan:  Diagnoses and all orders for this visit:  Type 2 diabetes mellitus without complication, without long-term current use of insulin (HCC) -      Glucose (CBG), Fasting -     HgB A1c 9.8  Prescribed medication below  -     sitaGLIPtin-metformin (JANUMET) 50-1000 MG tablet; Take 1 tablet by mouth 2 (two) times daily with a meal. -     glipiZIDE (GLUCOTROL) 10 MG tablet;  Take 1 tablet (10 mg total) by mouth 2 (two) times daily before a meal. Follow up goal A1C 7-7.5 Decrease foods that are high in carbohydrates are the following rice, potatoes, breads, sugars, and pastas.  Reduction in the intake (eating) will assist in lowering your blood sugars.  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 Increased Medication Fluoxetine 10mg  to FLUoxetine (PROZAC) 20 MG capsule    Other orders -     gabapentin (NEURONTIN) 400 MG capsule; Take 1 capsule (400 mg total) by mouth 3 (three) times daily. -     simethicone (MYLICON) 80 MG chewable tablet; Chew 1 tablet (80 mg total) by mouth every 6 (six) hours as needed. -     apixaban (ELIQUIS) 5 MG TABS tablet; Take 1 tablet (5 mg total) by mouth 2 (two) times daily. -     FLUoxetine (PROZAC) 20 MG capsule; Take 1 capsule (20 mg total) by mouth daily.   Follow-up: Return in about 3 months (around 08/26/2019) for diabetes follow up in person .    08/28/2019, NP

## 2019-05-27 NOTE — Progress Notes (Signed)
Patient addresses no concerns today.  Just routine check up  FBS- 438 A1C- 9.8

## 2019-05-31 ENCOUNTER — Telehealth (INDEPENDENT_AMBULATORY_CARE_PROVIDER_SITE_OTHER): Payer: Self-pay

## 2019-05-31 ENCOUNTER — Other Ambulatory Visit (INDEPENDENT_AMBULATORY_CARE_PROVIDER_SITE_OTHER): Payer: Self-pay | Admitting: Primary Care

## 2019-05-31 DIAGNOSIS — Z95828 Presence of other vascular implants and grafts: Secondary | ICD-10-CM

## 2019-05-31 NOTE — Telephone Encounter (Signed)
[facility] called wanting to know why the patient need to be seen in there clinic. I was able to advice that the IVC filter needed to be removed. [facility] stated they will not be able to remove filter from patient since the patient did not have the IVC filter inserted by their clinic. [facility] suggested for PCP to send a referral to [facility]  Interventional Radiology Departments phone number [phone]. [facility] will close the referral at this time and if patient needs future service please place a new referral.  Please send referral if appropriate

## 2019-05-31 NOTE — Progress Notes (Signed)
Referral completed for [facility] in [place] to interventional radiology for removal of IVC filter

## 2019-05-31 NOTE — Telephone Encounter (Signed)
Sent to PCP ?

## 2019-06-02 ENCOUNTER — Other Ambulatory Visit: Payer: Self-pay

## 2019-06-02 ENCOUNTER — Encounter
Payer: Medicaid Other | Attending: Physical Medicine and Rehabilitation | Admitting: Physical Medicine and Rehabilitation

## 2019-06-02 ENCOUNTER — Encounter: Payer: Self-pay | Admitting: Physical Medicine and Rehabilitation

## 2019-06-02 VITALS — BP 114/74 | HR 64 | Temp 97.5°F | Ht 70.0 in | Wt 250.0 lb

## 2019-06-02 DIAGNOSIS — N521 Erectile dysfunction due to diseases classified elsewhere: Secondary | ICD-10-CM | POA: Diagnosis not present

## 2019-06-02 DIAGNOSIS — S24101A Unspecified injury at T1 level of thoracic spinal cord, initial encounter: Secondary | ICD-10-CM | POA: Insufficient documentation

## 2019-06-02 DIAGNOSIS — K592 Neurogenic bowel, not elsewhere classified: Secondary | ICD-10-CM | POA: Diagnosis present

## 2019-06-02 DIAGNOSIS — M7918 Myalgia, other site: Secondary | ICD-10-CM | POA: Diagnosis present

## 2019-06-02 DIAGNOSIS — G8250 Quadriplegia, unspecified: Secondary | ICD-10-CM | POA: Diagnosis present

## 2019-06-02 DIAGNOSIS — R252 Cramp and spasm: Secondary | ICD-10-CM

## 2019-06-02 DIAGNOSIS — Z95828 Presence of other vascular implants and grafts: Secondary | ICD-10-CM | POA: Insufficient documentation

## 2019-06-02 DIAGNOSIS — N319 Neuromuscular dysfunction of bladder, unspecified: Secondary | ICD-10-CM | POA: Diagnosis present

## 2019-06-02 MED ORDER — BACLOFEN 5 MG PO TABS: 5.0000 mg | ORAL_TABLET | Freq: Three times a day (TID) | ORAL | 3 refills | Status: DC

## 2019-06-02 NOTE — Progress Notes (Signed)
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   Just pain in the neck- bothering him  Last set of injections "lasted awhile".  When missed last appointment, started feeling the pain.     Got Viagra- it worked welll for him. So doesn't need referral to Urology for this.    Was told needed to see if needed to continue therapy.  Still uses the hoyer lift.  Therapy is using sliding board.     Also has R hand/arm- goes numb on ulnar aspect of hand and goes up to elbow and above.  Also R arm gets swollen- not right now.  . Leans to the right eventually.  Back of w/c even more loose than L side.  Wears R handed glove- likes the pressure- doesn't hurt at much.      Exam: Awake, alert, appropriate, in power w/c, accompanied by wife, NAD 5th digit has less sensation to light touch than 2nd digit  4th digit feels the same on each side.  Tinel's Negative at elbow and R wrist as well  MS: grip 5/5 B/L Finger abd 3/5 B/L- slightly better on L than R   Plan: 1. Patient here for trigger point injections for  Consent done and on chart.  Cleaned areas with alcohol and injected using a 27 gauge 1.5 inch needle  Injected  Using 1% Lidocaine with no EPI  Upper traps B/L Levators B/L Posterior scalenes B/L Middle scalenes Splenius Capitus Pectoralis Major Rhomboids B/L Infraspinatus Teres Major/minor Thoracic paraspinals Lumbar paraspinals Other injections-    Patient's level of pain prior was  Current level of pain after injections is  There was no bleeding or complications.  Patient was advised to drink a lot of water on day after injections to flush system Will have increased soreness for 12-48 hours after injections.  Can use Lidocaine patches the day AFTER injections Can use theracane on day of injections in places didn't inject Can use heating pad  4-6 hours AFTER injections    2. Before does MRI of neck./cervical spine, let's see if things progress at all. Appears to be BELOW SCI- at T1- not above, so less likely a syrinx.    3. Is traveling 6/12- wants to get injections right before that.   4. No refills on meds except baclofen- Viagra is working  5. Insurance is making them take a break from therapy- can't rewrite therapy until July. Due to insurance reasons  6. Have them send paperwork to me, to agree that pt needs hospital bed and hoyer lift. He's a quadriplegic patient- needs to have both, absolutely.   7. F/U in 6 weeks  . I spent a total of 40 minutes on appointment total due to injections and discussing therapy.

## 2019-06-02 NOTE — Patient Instructions (Signed)
Plan: 1. Patient here for trigger point injections for  Consent done and on chart.  Cleaned areas with alcohol and injected using a 27 gauge 1.5 inch needle  Injected  Using 1% Lidocaine with no EPI  Upper traps B/L Levators B/L Posterior scalenes B/L Middle scalenes Splenius Capitus Pectoralis Major Rhomboids B/L Infraspinatus Teres Major/minor Thoracic paraspinals Lumbar paraspinals Other injections-    Patient's level of pain prior was  Current level of pain after injections is  There was no bleeding or complications.  Patient was advised to drink a lot of water on day after injections to flush system Will have increased soreness for 12-48 hours after injections.  Can use Lidocaine patches the day AFTER injections Can use theracane on day of injections in places didn't inject Can use heating pad 4-6 hours AFTER injections    2. Before does MRI of neck./cervical spine, let's see if things progress at all. Appears to be BELOW SCI- at T1- not above, so less likely a syrinx.    3. Is traveling 6/12- wants to get injections right before that.   4. No refills on meds except baclofen- Viagra is working  5. Insurance is making them take a break from therapy- can't rewrite therapy until July. Due to insurance reasons  6. Have them send paperwork to me, to agree that pt needs hospital bed and hoyer lift. He's a quadriplegic patient- needs to have both, absolutely.   7. F/U in 6 weeks

## 2019-06-07 ENCOUNTER — Other Ambulatory Visit (INDEPENDENT_AMBULATORY_CARE_PROVIDER_SITE_OTHER): Payer: Self-pay | Admitting: Primary Care

## 2019-06-07 ENCOUNTER — Other Ambulatory Visit: Payer: Self-pay | Admitting: Physical Medicine and Rehabilitation

## 2019-06-07 NOTE — Telephone Encounter (Signed)
Sent to PCP ?

## 2019-06-28 ENCOUNTER — Other Ambulatory Visit (INDEPENDENT_AMBULATORY_CARE_PROVIDER_SITE_OTHER): Payer: Self-pay | Admitting: Primary Care

## 2019-07-05 ENCOUNTER — Telehealth: Payer: Self-pay

## 2019-07-05 NOTE — Telephone Encounter (Signed)
PA FOR JANUMET APPROVED THRU 07/01/20

## 2019-07-16 ENCOUNTER — Encounter: Payer: Self-pay | Admitting: Physical Medicine and Rehabilitation

## 2019-07-16 ENCOUNTER — Other Ambulatory Visit: Payer: Self-pay

## 2019-07-16 ENCOUNTER — Encounter
Payer: Medicaid Other | Attending: Physical Medicine and Rehabilitation | Admitting: Physical Medicine and Rehabilitation

## 2019-07-16 VITALS — BP 96/64 | HR 63 | Temp 97.5°F | Ht 70.0 in

## 2019-07-16 DIAGNOSIS — S24101A Unspecified injury at T1 level of thoracic spinal cord, initial encounter: Secondary | ICD-10-CM | POA: Insufficient documentation

## 2019-07-16 DIAGNOSIS — N319 Neuromuscular dysfunction of bladder, unspecified: Secondary | ICD-10-CM | POA: Insufficient documentation

## 2019-07-16 DIAGNOSIS — K592 Neurogenic bowel, not elsewhere classified: Secondary | ICD-10-CM | POA: Diagnosis present

## 2019-07-16 DIAGNOSIS — G8250 Quadriplegia, unspecified: Secondary | ICD-10-CM | POA: Diagnosis present

## 2019-07-16 DIAGNOSIS — M7918 Myalgia, other site: Secondary | ICD-10-CM | POA: Insufficient documentation

## 2019-07-16 DIAGNOSIS — R252 Cramp and spasm: Secondary | ICD-10-CM | POA: Diagnosis not present

## 2019-07-16 DIAGNOSIS — Z95828 Presence of other vascular implants and grafts: Secondary | ICD-10-CM | POA: Diagnosis present

## 2019-07-16 MED ORDER — GABAPENTIN 600 MG PO TABS
600.0000 mg | ORAL_TABLET | Freq: Three times a day (TID) | ORAL | 11 refills | Status: DC
Start: 2019-07-16 — End: 2019-09-25

## 2019-07-16 MED ORDER — APIXABAN 5 MG PO TABS: 5.0000 mg | ORAL_TABLET | Freq: Two times a day (BID) | ORAL | 1 refills | Status: DC

## 2019-07-16 MED ORDER — BACLOFEN 10 MG PO TABS
10.0000 mg | ORAL_TABLET | Freq: Three times a day (TID) | ORAL | 11 refills | Status: DC
Start: 2019-07-16 — End: 2019-09-25

## 2019-07-16 MED ORDER — HYDROCODONE-ACETAMINOPHEN 5-325 MG PO TABS: 1.0000 | ORAL_TABLET | ORAL | 0 refills | Status: AC | PRN

## 2019-07-16 NOTE — Progress Notes (Signed)
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     After the last injections, after a few days, neck started hurting him- esp with rainy days.    Feels like injections help- lasted completely 2 weeks, then gradually gets worse from there.    Going to [place] on 6/10- 4 weeks.   Needs some refills of meds.  Some Rx's cannot be filled before leaves for [place]  Needs to get IVC filter removed.     Plan: 1. Placed consult for IR to remove IVC filter- also, need to address DVTs- for 2nd time  2. Think pt can stop Eliquis- since only had DVTs at 1 time- 9/8/202 was diagnosed- Would check with PCP/NP and double check and see if they agree with me.      3. Patient here for trigger point injections for secondary myofascial pain  Consent done and on chart.  Cleaned areas with alcohol and injected using a 27 gauge 1.5 inch needle  Injected 4.5cc Using 1% Lidocaine with no EPI  Upper traps B/L Levators B/L Posterior scalenes B/L Middle scalenes Splenius Capitus B/L Pectoralis Major Rhomboids B/L Infraspinatus Teres Major/minor Thoracic paraspinals Lumbar paraspinals Other injections- B/L triceps- got twitch response B/L   Patient's level of pain prior was was 6/10 Current level of pain after injections is- ~ 6/10- better ROM  There was no bleeding or complications.  Patient was advised to drink a lot of water on day after injections to flush system Will have increased soreness for 12-48 hours after injections.  Can use Lidocaine patches the day AFTER injections Can use theracane on day of injections in places didn't inject Can use heating pad 4-6 hours AFTER injections  4. Give 1x time Rx for Norco 5/325 Q4 hours prn since going to [place]- #30  5. Change Gabapentin to 600 mg 3x/day- for pain- since needs meds ot go to [place]- can change back after comes  back if need be  6. Baclofen Bottle will say 10 mg 3x/day- can still take 1/2 pill 3x/day or increase to 1 pill 3x/day for spasms.    7. F/U in 8 weeks   I spent a total of 50 minutes on appointment- as detailed above

## 2019-07-16 NOTE — Patient Instructions (Signed)
Plan: 1. Placed consult for IR to remove IVC filter- also, need to address DVTs- for 2nd time  2. Think pt can stop Eliquis- since only had DVTs at 1 time- 9/8/202 was diagnosed- Would check with PCP/NP and double check and see if they agree with me.      3. Patient here for trigger point injections for secondary myofascial pain  Consent done and on chart.  Cleaned areas with alcohol and injected using a 27 gauge 1.5 inch needle  Injected 4.5cc Using 1% Lidocaine with no EPI  Upper traps B/L Levators B/L Posterior scalenes B/L Middle scalenes Splenius Capitus B/L Pectoralis Major Rhomboids B/L Infraspinatus Teres Major/minor Thoracic paraspinals Lumbar paraspinals Other injections- B/L triceps- got twitch response B/L   Patient's level of pain prior was was 6/10 Current level of pain after injections is- ~ 6/10- better ROM  There was no bleeding or complications.  Patient was advised to drink a lot of water on day after injections to flush system Will have increased soreness for 12-48 hours after injections.  Can use Lidocaine patches the day AFTER injections Can use theracane on day of injections in places didn't inject Can use heating pad 4-6 hours AFTER injections  4. Give 1x time Rx for Norco 5/325 Q4 hours prn since going to [place]- #30  5. Change Gabapentin to 600 mg 3x/day- for pain- since needs meds ot go to [place]- can change back after comes back if need be  6. Baclofen Bottle will say 10 mg 3x/day- can still take 1/2 pill 3x/day or increase to 1 pill 3x/day for spasms.    7. F/U in 8 weeks

## 2019-07-19 ENCOUNTER — Other Ambulatory Visit: Payer: Self-pay | Admitting: Physical Medicine and Rehabilitation

## 2019-07-19 ENCOUNTER — Telehealth: Payer: Self-pay

## 2019-07-19 ENCOUNTER — Telehealth (HOSPITAL_COMMUNITY): Payer: Self-pay

## 2019-07-19 DIAGNOSIS — Z95828 Presence of other vascular implants and grafts: Secondary | ICD-10-CM

## 2019-07-19 DIAGNOSIS — Z86718 Personal history of other venous thrombosis and embolism: Secondary | ICD-10-CM

## 2019-07-19 NOTE — Telephone Encounter (Signed)
ERROR

## 2019-07-19 NOTE — Telephone Encounter (Signed)
[facility]. Left message regarding recent order placed for IVC filter retrieval. This filter was placed at [facility] in [place]. Typically patients get them removed and replaced at the same location. Left message for office to call back. AW

## 2019-07-19 NOTE — Telephone Encounter (Signed)
Called to schedule ultrasound/consult, no answer, no vm. AW

## 2019-09-06 ENCOUNTER — Encounter (INDEPENDENT_AMBULATORY_CARE_PROVIDER_SITE_OTHER): Payer: Self-pay | Admitting: Primary Care

## 2019-09-06 ENCOUNTER — Other Ambulatory Visit: Payer: Self-pay

## 2019-09-06 ENCOUNTER — Ambulatory Visit (INDEPENDENT_AMBULATORY_CARE_PROVIDER_SITE_OTHER): Payer: Medicaid Other | Admitting: Primary Care

## 2019-09-06 ENCOUNTER — Other Ambulatory Visit: Payer: Self-pay | Admitting: Physical Medicine and Rehabilitation

## 2019-09-06 VITALS — BP 124/81 | HR 68 | Temp 98.0°F | Resp 16

## 2019-09-06 DIAGNOSIS — E08621 Diabetes mellitus due to underlying condition with foot ulcer: Secondary | ICD-10-CM | POA: Diagnosis not present

## 2019-09-06 DIAGNOSIS — E119 Type 2 diabetes mellitus without complications: Secondary | ICD-10-CM | POA: Diagnosis not present

## 2019-09-06 DIAGNOSIS — L97501 Non-pressure chronic ulcer of other part of unspecified foot limited to breakdown of skin: Secondary | ICD-10-CM

## 2019-09-06 LAB — POCT GLYCOSYLATED HEMOGLOBIN (HGB A1C): Hemoglobin A1C: 6.5 % — AB (ref 4.0–5.6)

## 2019-09-06 LAB — GLUCOSE, POCT (MANUAL RESULT ENTRY): POC Glucose: 131 mg/dl — AB (ref 70–99)

## 2019-09-06 NOTE — Progress Notes (Signed)
Established Patient Office Visit  Subjective:  Patient ID: [name], male    DOB: [birth year]  Age: 54 y.o. MRN: [id number]  CC: No chief complaint on file.   HPI Mr.Reginold Knaus is a 54 year old Hispanic male that speaks [place] and understands fairly well presents for wounds bilateral feet with edema.  He recently flew to [place] and wife feels the plane ride is a contributing factor to discoloration open areas note picture below Patient is a paraplegic history of falling off a roof type II diabetic with a A1c improved to 6.5 previously 3 months ago A1c 9.8.  As wife to In-N-Out cath for a microalbumin.  Patient and wife in agreement urine Past Medical History:  Diagnosis Date  . Diabetes mellitus without complication ([place])   . History of cervical fracture     Past Surgical History:  Procedure Laterality Date  . CERVICAL SPINE SURGERY      Family History  Problem Relation Age of Onset  . Diabetes Mother   . Diabetes Father     Social History   Socioeconomic History  . Marital status: Married    Spouse name: Not on file  . Number of children: 4  . Years of education: 6th grade  . Highest education level: Not on file  Occupational History  . Occupation: Disabled  Tobacco Use  . Smoking status: Never Smoker  . Smokeless tobacco: Never Used  Substance and Sexual Activity  . Alcohol use: Yes    Comment: social  . Drug use: No  . Sexual activity: Not on file  Other Topics Concern  . Not on file  Social History Narrative   Lives at home with his family.   Right-handed.   No daily use of caffeine.   Social Determinants of Health   Financial Resource Strain:   . Difficulty of Paying Living Expenses:   Food Insecurity:   . Worried About Charity fundraiser in the Last Year:   . Arboriculturist in the Last Year:   Transportation Needs:   . Film/video editor (Medical):   Marland Kitchen Lack of Transportation (Non-Medical):   Physical Activity:   . Days of  Exercise per Week:   . Minutes of Exercise per Session:   Stress:   . Feeling of Stress :   Social Connections:   . Frequency of Communication with Friends and Family:   . Frequency of Social Gatherings with Friends and Family:   . Attends Religious Services:   . Active Member of Clubs or Organizations:   . Attends Archivist Meetings:   Marland Kitchen Marital Status:   Intimate Partner Violence:   . Fear of Current or Ex-Partner:   . Emotionally Abused:   Marland Kitchen Physically Abused:   . Sexually Abused:     Outpatient Medications Prior to Visit  Medication Sig Dispense Refill  . acetaminophen (TYLENOL) 500 MG tablet Take 500 mg by mouth every 6 (six) hours as needed.    . baclofen (LIORESAL) 10 MG tablet Take 1 tablet (10 mg total) by mouth 3 (three) times daily. 90 each 11  . blood glucose meter kit and supplies Dispense based on patient and insurance preference. Use up to four times daily as directed. (FOR ICD-10 E10.9, E11.9). 1 each 0  . FLUoxetine (PROZAC) 20 MG capsule Take 1 capsule (20 mg total) by mouth daily. 90 capsule 1  . gabapentin (NEURONTIN) 600 MG tablet Take 1 tablet (600 mg total) by mouth 3 (three)  times daily. 90 tablet 11  . oxyCODONE (OXY IR/ROXICODONE) 5 MG immediate release tablet TAKE 1 TABLET BY MOUTH EVERY 6 HOURS AS NEEDED FOR SEVERE PAIN 83 tablet 0  . senna (SENOKOT) 8.6 MG tablet Take 8.6 mg by mouth.    . sildenafil (VIAGRA) 100 MG tablet TAKE 1 TABLET BY MOUTH EVERY DAY AS NEEDED FOR ERECTILE DYSFUNCTION 10 tablet 11  . simethicone (MYLICON) 80 MG chewable tablet Chew 1 tablet (80 mg total) by mouth every 6 (six) hours as needed. 90 tablet 3  . sitaGLIPtin-metformin (JANUMET) 50-1000 MG tablet Take 1 tablet by mouth 2 (two) times daily with a meal. 180 tablet 1  . apixaban (ELIQUIS) 5 MG TABS tablet Take 1 tablet (5 mg total) by mouth 2 (two) times daily. 60 tablet 1  . glipiZIDE (GLUCOTROL) 10 MG tablet Take 1 tablet (10 mg total) by mouth 2 (two) times daily  before a meal. 60 tablet 3   No facility-administered medications prior to visit.    No Known Allergies  ROS Review of Systems  Constitutional: Positive for chills.  Endocrine: Positive for cold intolerance and heat intolerance.  All other systems reviewed and are negative.     Objective:    Physical Exam Vitals reviewed.  Constitutional:      Appearance: He is obese.  HENT:     Head: Normocephalic.  Eyes:     Pupils: Pupils are equal, round, and reactive to light.  Cardiovascular:     Rate and Rhythm: Normal rate and regular rhythm.     Pulses: Normal pulses.     Heart sounds: Normal heart sounds.  Pulmonary:     Effort: Pulmonary effort is normal.     Breath sounds: Normal breath sounds.  Abdominal:     General: Bowel sounds are normal.  Musculoskeletal:        General: Swelling and signs of injury present.     Cervical back: Normal range of motion and neck supple.     Right lower leg: Edema present.     Left lower leg: Edema present.  Skin:    General: Skin is warm.  Neurological:     Mental Status: He is alert and oriented to person, place, and time.     Sensory: Sensory deficit present.     Motor: Weakness present.     Gait: Gait abnormal.  Psychiatric:        Mood and Affect: Mood normal.        Behavior: Behavior normal.        Thought Content: Thought content normal.        Judgment: Judgment normal.     BP 124/81   Pulse 68   Temp 98 F (36.7 C)   Resp 16   SpO2 96%  Wt Readings from Last 3 Encounters:  06/02/19 250 lb (113.4 kg)     Health Maintenance Due  Topic Date Due  . Hepatitis C Screening  Never done  . COVID-19 Vaccine (1) Never done  . HIV Screening  Never done  . COLONOSCOPY  Never done    There are no preventive care reminders to display for this patient.  No results found for: TSH Lab Results  Component Value Date   WBC 8.5 11/26/2018   HGB 12.5 (L) 11/26/2018   HCT 37.9 11/26/2018   MCV 85 11/26/2018   PLT 260  11/26/2018   Lab Results  Component Value Date   NA 140 11/26/2018   K 3.6  11/26/2018   CO2 22 11/26/2018   GLUCOSE 166 (H) 11/26/2018   BUN 10 11/26/2018   CREATININE 0.64 (L) 11/26/2018   BILITOT 0.2 11/26/2018   ALKPHOS 101 11/26/2018   AST 11 11/26/2018   ALT 14 11/26/2018   PROT 6.8 11/26/2018   ALBUMIN 4.0 11/26/2018   CALCIUM 9.5 11/26/2018   ANIONGAP 9 04/24/2015   Lab Results  Component Value Date   CHOL 159 11/26/2018   Lab Results  Component Value Date   HDL 43 11/26/2018   Lab Results  Component Value Date   LDLCALC 88 11/26/2018   Lab Results  Component Value Date   TRIG 164 (H) 11/26/2018   Lab Results  Component Value Date   CHOLHDL 3.7 11/26/2018   Lab Results  Component Value Date   HGBA1C 6.5 (A) 09/06/2019      Assessment & Plan:  Diagnoses and all orders for this visit:  Type 2 diabetes mellitus without complication, without long-term current use of insulin (HCC) -     Glucose (CBG) -     HgB A1c -     Microalbumin, urine  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  Dr. Dellia Nims will review patient's photos and advise treatment or urgent appointment with the wound care center/him. Santyl ordered for affected areas until seen.   Meds ordered this encounter  Medications  . collagenase (SANTYL) ointment    Sig: Apply 1 application topically daily. Apply to eschar area black color until seen by wound care center    Dispense:  30 g    Refill:  0    Follow-up: Return in about 6 months (around 03/08/2020) for DM.    Kerin Perna, NP

## 2019-09-06 NOTE — Progress Notes (Signed)
Here for DM F /u and meds refills. Requested a foot examination

## 2019-09-07 ENCOUNTER — Telehealth: Payer: Self-pay

## 2019-09-07 ENCOUNTER — Other Ambulatory Visit (INDEPENDENT_AMBULATORY_CARE_PROVIDER_SITE_OTHER): Payer: Self-pay | Admitting: Primary Care

## 2019-09-07 LAB — MICROALBUMIN, URINE: Microalbumin, Urine: 35.3 ug/mL

## 2019-09-07 MED ORDER — SANTYL 250 UNIT/GM EX OINT: 1.0000 "application " | TOPICAL_OINTMENT | Freq: Every day | CUTANEOUS | 0 refills | Status: DC

## 2019-09-07 NOTE — Telephone Encounter (Signed)
Called pt made aware of RX sent to his pharmacy / Instructions and education on how to use given . Verbalized understanding . Made pt aware that referral to see a wound specialist was placed.

## 2019-09-08 ENCOUNTER — Telehealth (HOSPITAL_COMMUNITY): Payer: Self-pay

## 2019-09-08 NOTE — Telephone Encounter (Signed)
Called regarding ivc filter removal, no answer, vm not available. AW

## 2019-09-10 ENCOUNTER — Telehealth (INDEPENDENT_AMBULATORY_CARE_PROVIDER_SITE_OTHER): Payer: Self-pay | Admitting: Primary Care

## 2019-09-10 ENCOUNTER — Encounter: Payer: Medicaid Other | Admitting: Physical Medicine and Rehabilitation

## 2019-09-10 ENCOUNTER — Telehealth (INDEPENDENT_AMBULATORY_CARE_PROVIDER_SITE_OTHER): Payer: Self-pay

## 2019-09-10 NOTE — Telephone Encounter (Signed)
Call placed to patient using pacific interpreter [phone]) patient was unavailable. Normal kidney function results provided to his wife per DPR. She will inform patient. Maryjean Morn, CMA

## 2019-09-10 NOTE — Telephone Encounter (Signed)
Please advice patient.

## 2019-09-10 NOTE — Telephone Encounter (Signed)
Pts wife called stating that the cream, collangenase, that the pt was prescribed is not covered by insurance. She is requesting to have another medication sent in to pharmacy. Please advise.     [facility] - [place], [place] - [address]  [address] [place] [address]  Phone: [phone] Fax: [phone]  Hours: Not open 24 hours

## 2019-09-10 NOTE — Telephone Encounter (Signed)
-----   Message from Grayce Sessions, NP sent at 09/07/2019  8:26 PM EDT ----- Microalbumin is normal which indicates your kidneys are functioning normally

## 2019-09-21 ENCOUNTER — Encounter (HOSPITAL_COMMUNITY): Payer: Self-pay | Admitting: Emergency Medicine

## 2019-09-21 ENCOUNTER — Ambulatory Visit (HOSPITAL_COMMUNITY)
Admission: EM | Admit: 2019-09-21 | Discharge: 2019-09-21 | Disposition: A | Discharge status: Home / Self Care | Payer: Medicaid Other | Attending: Urgent Care | Admitting: Urgent Care

## 2019-09-21 ENCOUNTER — Inpatient Hospital Stay (HOSPITAL_COMMUNITY)
Admission: EM | Admit: 2019-09-21 | Discharge: 2019-09-25 | DRG: 602 | Disposition: A | Discharge status: Home / Self Care | Payer: Medicaid Other | Attending: Internal Medicine | Admitting: Internal Medicine

## 2019-09-21 ENCOUNTER — Other Ambulatory Visit: Payer: Self-pay

## 2019-09-21 DIAGNOSIS — Z20822 Contact with and (suspected) exposure to covid-19: Secondary | ICD-10-CM | POA: Diagnosis present

## 2019-09-21 DIAGNOSIS — L03115 Cellulitis of right lower limb: Principal | ICD-10-CM | POA: Diagnosis present

## 2019-09-21 DIAGNOSIS — L97929 Non-pressure chronic ulcer of unspecified part of left lower leg with unspecified severity: Secondary | ICD-10-CM

## 2019-09-21 DIAGNOSIS — Z7901 Long term (current) use of anticoagulants: Secondary | ICD-10-CM

## 2019-09-21 DIAGNOSIS — Y9269 Other specified industrial and construction area as the place of occurrence of the external cause: Secondary | ICD-10-CM

## 2019-09-21 DIAGNOSIS — N319 Neuromuscular dysfunction of bladder, unspecified: Secondary | ICD-10-CM | POA: Diagnosis present

## 2019-09-21 DIAGNOSIS — W132XXA Fall from, out of or through roof, initial encounter: Secondary | ICD-10-CM | POA: Diagnosis present

## 2019-09-21 DIAGNOSIS — Z79891 Long term (current) use of opiate analgesic: Secondary | ICD-10-CM

## 2019-09-21 DIAGNOSIS — L97519 Non-pressure chronic ulcer of other part of right foot with unspecified severity: Secondary | ICD-10-CM | POA: Diagnosis not present

## 2019-09-21 DIAGNOSIS — A419 Sepsis, unspecified organism: Secondary | ICD-10-CM | POA: Diagnosis present

## 2019-09-21 DIAGNOSIS — E876 Hypokalemia: Secondary | ICD-10-CM | POA: Diagnosis present

## 2019-09-21 DIAGNOSIS — L02419 Cutaneous abscess of limb, unspecified: Secondary | ICD-10-CM | POA: Diagnosis present

## 2019-09-21 DIAGNOSIS — G8250 Quadriplegia, unspecified: Secondary | ICD-10-CM

## 2019-09-21 DIAGNOSIS — Z95828 Presence of other vascular implants and grafts: Secondary | ICD-10-CM | POA: Diagnosis not present

## 2019-09-21 DIAGNOSIS — Z794 Long term (current) use of insulin: Secondary | ICD-10-CM

## 2019-09-21 DIAGNOSIS — Z79899 Other long term (current) drug therapy: Secondary | ICD-10-CM

## 2019-09-21 DIAGNOSIS — F329 Major depressive disorder, single episode, unspecified: Secondary | ICD-10-CM | POA: Diagnosis present

## 2019-09-21 DIAGNOSIS — E11628 Type 2 diabetes mellitus with other skin complications: Secondary | ICD-10-CM | POA: Diagnosis present

## 2019-09-21 DIAGNOSIS — E119 Type 2 diabetes mellitus without complications: Secondary | ICD-10-CM

## 2019-09-21 DIAGNOSIS — S24101D Unspecified injury at T1 level of thoracic spinal cord, subsequent encounter: Secondary | ICD-10-CM

## 2019-09-21 DIAGNOSIS — L97529 Non-pressure chronic ulcer of other part of left foot with unspecified severity: Secondary | ICD-10-CM | POA: Diagnosis not present

## 2019-09-21 DIAGNOSIS — L0390 Cellulitis, unspecified: Secondary | ICD-10-CM | POA: Diagnosis present

## 2019-09-21 DIAGNOSIS — Z86718 Personal history of other venous thrombosis and embolism: Secondary | ICD-10-CM

## 2019-09-21 DIAGNOSIS — E11622 Type 2 diabetes mellitus with other skin ulcer: Secondary | ICD-10-CM | POA: Diagnosis present

## 2019-09-21 DIAGNOSIS — L8990 Pressure ulcer of unspecified site, unspecified stage: Secondary | ICD-10-CM | POA: Diagnosis present

## 2019-09-21 DIAGNOSIS — Z6835 Body mass index (BMI) 35.0-35.9, adult: Secondary | ICD-10-CM

## 2019-09-21 DIAGNOSIS — S24101A Unspecified injury at T1 level of thoracic spinal cord, initial encounter: Secondary | ICD-10-CM | POA: Diagnosis present

## 2019-09-21 DIAGNOSIS — L89899 Pressure ulcer of other site, unspecified stage: Secondary | ICD-10-CM | POA: Diagnosis present

## 2019-09-21 DIAGNOSIS — Z833 Family history of diabetes mellitus: Secondary | ICD-10-CM

## 2019-09-21 DIAGNOSIS — E669 Obesity, unspecified: Secondary | ICD-10-CM | POA: Diagnosis present

## 2019-09-21 DIAGNOSIS — L97919 Non-pressure chronic ulcer of unspecified part of right lower leg with unspecified severity: Secondary | ICD-10-CM | POA: Diagnosis present

## 2019-09-21 DIAGNOSIS — L03119 Cellulitis of unspecified part of limb: Secondary | ICD-10-CM

## 2019-09-21 LAB — CBC WITH DIFFERENTIAL/PLATELET
Abs Immature Granulocytes: 0.02 10*3/uL (ref 0.00–0.07)
Basophils Absolute: 0.1 10*3/uL (ref 0.0–0.1)
Basophils Relative: 1 %
Eosinophils Absolute: 0.4 10*3/uL (ref 0.0–0.5)
Eosinophils Relative: 8 %
HCT: 42.1 % (ref 39.0–52.0)
Hemoglobin: 13.5 g/dL (ref 13.0–17.0)
Immature Granulocytes: 0 %
Lymphocytes Relative: 30 %
Lymphs Abs: 1.7 10*3/uL (ref 0.7–4.0)
MCH: 30.3 pg (ref 26.0–34.0)
MCHC: 32.1 g/dL (ref 30.0–36.0)
MCV: 94.4 fL (ref 80.0–100.0)
Monocytes Absolute: 0.4 10*3/uL (ref 0.1–1.0)
Monocytes Relative: 8 %
Neutro Abs: 3.1 10*3/uL (ref 1.7–7.7)
Neutrophils Relative %: 53 %
Platelets: 277 10*3/uL (ref 150–400)
RBC: 4.46 MIL/uL (ref 4.22–5.81)
RDW: 12.3 % (ref 11.5–15.5)
WBC: 5.7 10*3/uL (ref 4.0–10.5)
nRBC: 0 % (ref 0.0–0.2)

## 2019-09-21 LAB — COMPREHENSIVE METABOLIC PANEL
ALT: 18 U/L (ref 0–44)
AST: 18 U/L (ref 15–41)
Albumin: 3.2 g/dL — ABNORMAL LOW (ref 3.5–5.0)
Alkaline Phosphatase: 63 U/L (ref 38–126)
Anion gap: 9 (ref 5–15)
BUN: 5 mg/dL — ABNORMAL LOW (ref 6–20)
CO2: 26 mmol/L (ref 22–32)
Calcium: 8.9 mg/dL (ref 8.9–10.3)
Chloride: 106 mmol/L (ref 98–111)
Creatinine, Ser: 0.43 mg/dL — ABNORMAL LOW (ref 0.61–1.24)
GFR calc Af Amer: >60 mL/min (ref >60)
GFR calc non Af Amer: >60 mL/min (ref >60)
Glucose, Bld: 176 mg/dL — ABNORMAL HIGH (ref 70–99)
Potassium: 3.4 mmol/L — ABNORMAL LOW (ref 3.5–5.1)
Sodium: 141 mmol/L (ref 135–145)
Total Bilirubin: 0.5 mg/dL (ref 0.3–1.2)
Total Protein: 6.7 g/dL (ref 6.5–8.1)

## 2019-09-21 LAB — URINALYSIS, ROUTINE W REFLEX MICROSCOPIC
Bilirubin Urine: NEGATIVE
Glucose, UA: NEGATIVE mg/dL
Hgb urine dipstick: NEGATIVE
Ketones, ur: NEGATIVE mg/dL
Nitrite: NEGATIVE
Protein, ur: NEGATIVE mg/dL
Specific Gravity, Urine: 1.025 (ref 1.005–1.030)
pH: 6 (ref 5.0–8.0)

## 2019-09-21 LAB — LACTIC ACID, PLASMA: Lactic Acid, Venous: 1.4 mmol/L (ref 0.5–1.9)

## 2019-09-21 NOTE — ED Triage Notes (Signed)
Pt sent to ER to have bilateral lower extremity ulcers evaluated and have labs drawn.   Per pt these wounds have been here for over 1 month poor healing, pt is wheelchair bound.

## 2019-09-21 NOTE — ED Provider Notes (Signed)
[facility]   MRN: [id number] DOB: 1965/07/13  Subjective:   [name] is a 54 y.o. male presenting for 1 month history of persistent ulcers over both his feet that have been worsening.  Patient presents with his family member, caregiver.  She states that he is wheelchair-bound, has quadriplegia.  States that symptoms started after she tried some new shoes on him and has since had persistent ulcers.  He does have a PCP, have seen him a few times for this but have not done any imaging or blood work.  She states that they have given him a cream to use but has not helped at all.  Blood sugars well controlled, last A1c less than 7% in July 2021.  No current facility-administered medications for this encounter.  Current Outpatient Medications:  .  acetaminophen (TYLENOL) 500 MG tablet, Take 500 mg by mouth every 6 (six) hours as needed., Disp: , Rfl:  .  baclofen (LIORESAL) 10 MG tablet, Take 1 tablet (10 mg total) by mouth 3 (three) times daily., Disp: 90 each, Rfl: 11 .  blood glucose meter kit and supplies, Dispense based on patient and insurance preference. Use up to four times daily as directed. (FOR ICD-10 E10.9, E11.9)., Disp: 1 each, Rfl: 0 .  collagenase (SANTYL) ointment, Apply 1 application topically daily. Apply to eschar area black color until seen by wound care center, Disp: 30 g, Rfl: 0 .  ELIQUIS 5 MG TABS tablet, TAKE 1 TABLET BY MOUTH 2 TIMES DAILY, Disp: 60 tablet, Rfl: 1 .  FLUoxetine (PROZAC) 20 MG capsule, Take 1 capsule (20 mg total) by mouth daily., Disp: 90 capsule, Rfl: 1 .  gabapentin (NEURONTIN) 600 MG tablet, Take 1 tablet (600 mg total) by mouth 3 (three) times daily., Disp: 90 tablet, Rfl: 11 .  oxyCODONE (OXY IR/ROXICODONE) 5 MG immediate release tablet, TAKE 1 TABLET BY MOUTH EVERY 6 HOURS AS NEEDED FOR SEVERE PAIN, Disp: 83 tablet, Rfl: 0 .  senna (SENOKOT) 8.6 MG tablet, Take 8.6 mg by mouth., Disp: , Rfl:  .  sildenafil (VIAGRA) 100 MG tablet, TAKE  1 TABLET BY MOUTH EVERY DAY AS NEEDED FOR ERECTILE DYSFUNCTION, Disp: 10 tablet, Rfl: 11 .  simethicone (MYLICON) 80 MG chewable tablet, Chew 1 tablet (80 mg total) by mouth every 6 (six) hours as needed., Disp: 90 tablet, Rfl: 3 .  sitaGLIPtin-metformin (JANUMET) 50-1000 MG tablet, Take 1 tablet by mouth 2 (two) times daily with a meal., Disp: 180 tablet, Rfl: 1   No Known Allergies  Past Medical History:  Diagnosis Date  . Diabetes mellitus without complication ([place])   . History of cervical fracture      Past Surgical History:  Procedure Laterality Date  . CERVICAL SPINE SURGERY      Family History  Problem Relation Age of Onset  . Diabetes Mother   . Diabetes Father     Social History   Tobacco Use  . Smoking status: Never Smoker  . Smokeless tobacco: Never Used  Substance Use Topics  . Alcohol use: Yes    Comment: social  . Drug use: No    ROS   Objective:   Vitals: BP 126/66 (BP Location: Left Arm)   Pulse 71   Temp 98.2 F (36.8 C) (Oral)   Resp 16   SpO2 99%   Physical Exam Constitutional:      General: He is not in acute distress.    Appearance: Normal appearance. He is well-developed and normal weight.  He is not ill-appearing, toxic-appearing or diaphoretic.  HENT:     Head: Normocephalic and atraumatic.     Right Ear: External ear normal.     Left Ear: External ear normal.     Nose: Nose normal.     Mouth/Throat:     Pharynx: Oropharynx is clear.  Eyes:     General: No scleral icterus.       Right eye: No discharge.        Left eye: No discharge.     Extraocular Movements: Extraocular movements intact.     Pupils: Pupils are equal, round, and reactive to light.  Cardiovascular:     Rate and Rhythm: Normal rate.  Pulmonary:     Effort: Pulmonary effort is normal.  Musculoskeletal:     Cervical back: Normal range of motion.       Feet:  Neurological:     Mental Status: He is alert and oriented to person, place, and time.  Psychiatric:         Mood and Affect: Mood normal.        Behavior: Behavior normal.        Thought Content: Thought content normal.        Judgment: Judgment normal.       Assessment and Plan :   PDMP not reviewed this encounter.  1. Ulcer of both feet, unspecified ulcer stage ([place])   2. Skin ulcer of multiple sites of left lower extremity, unspecified ulcer stage ([place])   3. Quadriplegia ([place])   4. Presence of IVC filter   5. Well controlled diabetes mellitus ([place])     Unfortunately, we are not able to do imaging of his lower legs for quadriplegic patient.  Recommended evaluation including blood work and imaging in the emergency room.  Dressings placed on patient's legs.  His family member/caregiver will transport him there now by personal vehicle.   Jaynee Eagles, PA-C 09/21/19 1430

## 2019-09-21 NOTE — Discharge Instructions (Signed)
Please report to the emergency room now for further evaluation, labs, imaging of your severe ulcers.

## 2019-09-21 NOTE — ED Triage Notes (Signed)
Pt presents to [facility] for assessment of ulcers to bilateral feet and posterior left ankle.  Patient states they have been there x 1 month.  Wife states some blackened area.

## 2019-09-22 ENCOUNTER — Encounter: Payer: Medicaid Other | Admitting: Physical Medicine and Rehabilitation

## 2019-09-22 ENCOUNTER — Emergency Department (HOSPITAL_COMMUNITY): Payer: Medicaid Other

## 2019-09-22 ENCOUNTER — Encounter (HOSPITAL_COMMUNITY): Payer: Self-pay | Admitting: Internal Medicine

## 2019-09-22 DIAGNOSIS — N319 Neuromuscular dysfunction of bladder, unspecified: Secondary | ICD-10-CM | POA: Diagnosis present

## 2019-09-22 DIAGNOSIS — L03119 Cellulitis of unspecified part of limb: Secondary | ICD-10-CM | POA: Diagnosis not present

## 2019-09-22 DIAGNOSIS — R609 Edema, unspecified: Secondary | ICD-10-CM | POA: Diagnosis not present

## 2019-09-22 DIAGNOSIS — L0390 Cellulitis, unspecified: Secondary | ICD-10-CM | POA: Diagnosis present

## 2019-09-22 DIAGNOSIS — Z6835 Body mass index (BMI) 35.0-35.9, adult: Secondary | ICD-10-CM | POA: Diagnosis not present

## 2019-09-22 DIAGNOSIS — S24101A Unspecified injury at T1 level of thoracic spinal cord, initial encounter: Secondary | ICD-10-CM

## 2019-09-22 DIAGNOSIS — Z79899 Other long term (current) drug therapy: Secondary | ICD-10-CM | POA: Diagnosis not present

## 2019-09-22 DIAGNOSIS — Z833 Family history of diabetes mellitus: Secondary | ICD-10-CM | POA: Diagnosis not present

## 2019-09-22 DIAGNOSIS — L02419 Cutaneous abscess of limb, unspecified: Secondary | ICD-10-CM | POA: Diagnosis present

## 2019-09-22 DIAGNOSIS — L03115 Cellulitis of right lower limb: Secondary | ICD-10-CM | POA: Diagnosis not present

## 2019-09-22 DIAGNOSIS — E11628 Type 2 diabetes mellitus with other skin complications: Secondary | ICD-10-CM | POA: Diagnosis present

## 2019-09-22 DIAGNOSIS — L89899 Pressure ulcer of other site, unspecified stage: Secondary | ICD-10-CM | POA: Diagnosis present

## 2019-09-22 DIAGNOSIS — Z86718 Personal history of other venous thrombosis and embolism: Secondary | ICD-10-CM | POA: Diagnosis not present

## 2019-09-22 DIAGNOSIS — L97919 Non-pressure chronic ulcer of unspecified part of right lower leg with unspecified severity: Secondary | ICD-10-CM | POA: Diagnosis present

## 2019-09-22 DIAGNOSIS — Z95828 Presence of other vascular implants and grafts: Secondary | ICD-10-CM

## 2019-09-22 DIAGNOSIS — E669 Obesity, unspecified: Secondary | ICD-10-CM | POA: Diagnosis present

## 2019-09-22 DIAGNOSIS — E876 Hypokalemia: Secondary | ICD-10-CM

## 2019-09-22 DIAGNOSIS — W132XXA Fall from, out of or through roof, initial encounter: Secondary | ICD-10-CM | POA: Diagnosis present

## 2019-09-22 DIAGNOSIS — E11622 Type 2 diabetes mellitus with other skin ulcer: Secondary | ICD-10-CM | POA: Diagnosis present

## 2019-09-22 DIAGNOSIS — L8990 Pressure ulcer of unspecified site, unspecified stage: Secondary | ICD-10-CM | POA: Diagnosis not present

## 2019-09-22 DIAGNOSIS — S24101D Unspecified injury at T1 level of thoracic spinal cord, subsequent encounter: Secondary | ICD-10-CM | POA: Diagnosis not present

## 2019-09-22 DIAGNOSIS — L97929 Non-pressure chronic ulcer of unspecified part of left lower leg with unspecified severity: Secondary | ICD-10-CM | POA: Diagnosis present

## 2019-09-22 DIAGNOSIS — A419 Sepsis, unspecified organism: Secondary | ICD-10-CM | POA: Diagnosis present

## 2019-09-22 DIAGNOSIS — Z79891 Long term (current) use of opiate analgesic: Secondary | ICD-10-CM | POA: Diagnosis not present

## 2019-09-22 DIAGNOSIS — Z7901 Long term (current) use of anticoagulants: Secondary | ICD-10-CM | POA: Diagnosis not present

## 2019-09-22 DIAGNOSIS — Z20822 Contact with and (suspected) exposure to covid-19: Secondary | ICD-10-CM | POA: Diagnosis present

## 2019-09-22 DIAGNOSIS — F329 Major depressive disorder, single episode, unspecified: Secondary | ICD-10-CM | POA: Diagnosis present

## 2019-09-22 DIAGNOSIS — Z794 Long term (current) use of insulin: Secondary | ICD-10-CM | POA: Diagnosis not present

## 2019-09-22 DIAGNOSIS — G8250 Quadriplegia, unspecified: Secondary | ICD-10-CM | POA: Diagnosis present

## 2019-09-22 DIAGNOSIS — R509 Fever, unspecified: Secondary | ICD-10-CM | POA: Diagnosis present

## 2019-09-22 DIAGNOSIS — Y9269 Other specified industrial and construction area as the place of occurrence of the external cause: Secondary | ICD-10-CM | POA: Diagnosis not present

## 2019-09-22 LAB — C-REACTIVE PROTEIN: CRP: 1.7 mg/dL — ABNORMAL HIGH (ref <1.0)

## 2019-09-22 LAB — CBG MONITORING, ED: Glucose-Capillary: 134 mg/dL — ABNORMAL HIGH (ref 70–99)

## 2019-09-22 LAB — HIV ANTIBODY (ROUTINE TESTING W REFLEX): HIV Screen 4th Generation wRfx: NONREACTIVE

## 2019-09-22 LAB — SARS CORONAVIRUS 2 BY RT PCR (HOSPITAL ORDER, PERFORMED IN ~~LOC~~ HOSPITAL LAB): SARS Coronavirus 2: NEGATIVE

## 2019-09-22 LAB — SEDIMENTATION RATE: Sed Rate: 39 mm/hr — ABNORMAL HIGH (ref 0–16)

## 2019-09-22 MED ORDER — SODIUM CHLORIDE 0.9% FLUSH
3.0000 mL | Freq: Two times a day (BID) | INTRAVENOUS | Status: DC
  Administered 2019-09-22: 3 mL via INTRAVENOUS

## 2019-09-22 MED ORDER — BACLOFEN 5 MG HALF TABLET
5.0000 mg | ORAL_TABLET | Freq: Three times a day (TID) | ORAL | Status: DC
  Administered 2019-09-22 – 2019-09-25 (×8): 5 mg via ORAL
  Filled 2019-09-22 (×9): qty 1

## 2019-09-22 MED ORDER — INSULIN ASPART 100 UNIT/ML ~~LOC~~ SOLN: 0.0000 [IU] | Freq: Every day | SUBCUTANEOUS | Status: DC

## 2019-09-22 MED ORDER — FLUOXETINE HCL 10 MG PO CAPS
10.0000 mg | ORAL_CAPSULE | Freq: Every day | ORAL | Status: DC
  Administered 2019-09-22 – 2019-09-25 (×4): 10 mg via ORAL
  Filled 2019-09-22 (×4): qty 1

## 2019-09-22 MED ORDER — ENOXAPARIN SODIUM 40 MG/0.4ML ~~LOC~~ SOLN: 40.0000 mg | SUBCUTANEOUS | Status: DC

## 2019-09-22 MED ORDER — INSULIN ASPART 100 UNIT/ML ~~LOC~~ SOLN
0.0000 [IU] | Freq: Three times a day (TID) | SUBCUTANEOUS | Status: DC
  Administered 2019-09-22: 1 [IU] via SUBCUTANEOUS
  Administered 2019-09-23: 2 [IU] via SUBCUTANEOUS
  Administered 2019-09-23 – 2019-09-24 (×2): 1 [IU] via SUBCUTANEOUS

## 2019-09-22 MED ORDER — POLYVINYL ALCOHOL 1.4 % OP SOLN
1.0000 [drp] | Freq: Every day | OPHTHALMIC | Status: DC | PRN
  Filled 2019-09-22: qty 15

## 2019-09-22 MED ORDER — CLINDAMYCIN PHOSPHATE 600 MG/50ML IV SOLN
600.0000 mg | Freq: Three times a day (TID) | INTRAVENOUS | Status: DC
  Administered 2019-09-22 – 2019-09-23 (×2): 600 mg via INTRAVENOUS
  Filled 2019-09-22 (×3): qty 50

## 2019-09-22 MED ORDER — ACETAMINOPHEN 325 MG PO TABS
650.0000 mg | ORAL_TABLET | ORAL | Status: AC
  Administered 2019-09-22: 650 mg via ORAL
  Filled 2019-09-22: qty 2

## 2019-09-22 MED ORDER — SENNA 8.6 MG PO TABS
2.0000 | ORAL_TABLET | Freq: Every day | ORAL | Status: DC
  Administered 2019-09-22 – 2019-09-23 (×2): 17.2 mg via ORAL
  Filled 2019-09-22 (×3): qty 2

## 2019-09-22 MED ORDER — POTASSIUM CHLORIDE CRYS ER 20 MEQ PO TBCR
20.0000 meq | EXTENDED_RELEASE_TABLET | ORAL | Status: AC
  Administered 2019-09-22: 20 meq via ORAL
  Filled 2019-09-22: qty 1

## 2019-09-22 MED ORDER — CLINDAMYCIN PHOSPHATE 600 MG/50ML IV SOLN
600.0000 mg | Freq: Once | INTRAVENOUS | Status: AC
  Administered 2019-09-22: 600 mg via INTRAVENOUS
  Filled 2019-09-22: qty 50

## 2019-09-22 MED ORDER — ACETAMINOPHEN 325 MG PO TABS: 650.0000 mg | ORAL_TABLET | Freq: Four times a day (QID) | ORAL | Status: DC | PRN

## 2019-09-22 MED ORDER — APIXABAN 5 MG PO TABS
5.0000 mg | ORAL_TABLET | Freq: Two times a day (BID) | ORAL | Status: DC
  Administered 2019-09-22 – 2019-09-25 (×6): 5 mg via ORAL
  Filled 2019-09-22 (×7): qty 1

## 2019-09-22 MED ORDER — BISACODYL 10 MG RE SUPP
10.0000 mg | Freq: Every day | RECTAL | Status: DC
  Administered 2019-09-22 – 2019-09-24 (×2): 10 mg via RECTAL
  Filled 2019-09-22 (×3): qty 1

## 2019-09-22 MED ORDER — ALBUTEROL SULFATE (2.5 MG/3ML) 0.083% IN NEBU: 2.5000 mg | INHALATION_SOLUTION | Freq: Four times a day (QID) | RESPIRATORY_TRACT | Status: DC | PRN

## 2019-09-22 MED ORDER — SIMETHICONE 80 MG PO CHEW
80.0000 mg | CHEWABLE_TABLET | Freq: Three times a day (TID) | ORAL | Status: DC
  Administered 2019-09-22 – 2019-09-25 (×8): 80 mg via ORAL
  Filled 2019-09-22 (×8): qty 1

## 2019-09-22 NOTE — ED Provider Notes (Signed)
[facility] EMERGENCY DEPARTMENT Provider Note   CSN: [id number] Arrival date & time: 09/21/19  1420     History Chief Complaint  Patient presents with  . Wound Check    [name] is a 54 y.o. male history of quadriplegia, diabetes who presents for evaluation of discoloration, wounds noted to bilateral feet that have been ongoing for about a month.  Patient states that initially a month ago, he noticed some slight discoloration to the left plantar surfaces of his feet.  He initially attributed to new shoes that he has been wearing but states that they persisted and got worse.  He was reportedly seen in a clinic in [place] and was given antibiotics of which she does not member the name as well as cream.  He reports that he finished antibiotics a few weeks ago and despite those medications, the areas have been getting worse.  He went to urgent care yesterday and was sent to the ED for further evaluation.  He states that he does not have any feeling in those feet.  He states that he always has swelling noted to the lower extremities.  He has noticed drainage from some of the wounds.  He states that sometimes it will be a clear/yellow liquid drainage.  No blood.  He has not noticed them to be normal or hot.  He states he has had some subjective fever and chills but has not measured any temperature.  He denies any nausea/vomiting, difficulty breathing.  He does have history of diabetes.  The history is provided by the patient.       Past Medical History:  Diagnosis Date  . Diabetes mellitus without complication ([place])   . History of cervical fracture     Patient Active Problem List   Diagnosis Date Noted  . Cellulitis and abscess of upper extremity 09/22/2019  . Spasticity 03/08/2019  . Erectile dysfunction due to diseases classified elsewhere 03/08/2019  . 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 Neurogenic bowel 01/22/2019  . Neurogenic bladder 01/22/2019  .  Myofascial pain 01/22/2019  . Spinal cord injury at T1-T6 level ([place]) 12/30/2018  . Quadriplegia ([place]) 12/30/2018    Past Surgical History:  Procedure Laterality Date  . CERVICAL SPINE SURGERY         Family History  Problem Relation Age of Onset  . Diabetes Mother   . Diabetes Father     Social History   Tobacco Use  . Smoking status: Never Smoker  . Smokeless tobacco: Never Used  Substance Use Topics  . Alcohol use: Yes    Comment: social  . Drug use: No    Home Medications Prior to Admission medications   Medication Sig Start Date End Date Taking? Authorizing Provider  acetaminophen (TYLENOL) 500 MG tablet Take 500 mg by mouth every 6 (six) hours as needed.   Yes [provider]  Baclofen 5 MG TABS Take 1 tablet by mouth in the morning, at noon, and at bedtime.   Yes [provider]  BISACODYL LAXATIVE RE Place 1 suppository rectally at bedtime.   Yes [provider]  Carboxymethylcellulose Sodium (ARTIFICIAL TEARS OP) Apply 1-2 drops to eye daily as needed (dry eyes).    Yes [provider]  ELIQUIS 5 MG TABS tablet TAKE 1 TABLET BY MOUTH 2 TIMES DAILY Patient taking differently: Take 5 mg by mouth 2 (two) times daily.  09/07/19  Yes Lovorn, Jinny Blossom, MD  FLUoxetine (PROZAC) 20 MG capsule Take 1  capsule (20 mg total) by mouth daily. 05/27/19  Yes Kerin Perna, NP  senna (SENOKOT) 8.6 MG tablet Take 2 tablets by mouth daily.  09/25/18  Yes [provider]  sildenafil (VIAGRA) 100 MG tablet TAKE 1 TABLET BY MOUTH EVERY DAY AS NEEDED FOR ERECTILE DYSFUNCTION Patient taking differently: Take 100 mg by mouth daily as needed for erectile dysfunction.  06/07/19  Yes Lovorn, Jinny Blossom, MD  simethicone (MYLICON) 80 MG chewable tablet Chew 1 tablet (80 mg total) by mouth every 6 (six) hours as needed. Patient taking differently: Chew 80 mg by mouth 3 (three) times daily after meals.  05/27/19  Yes Kerin Perna, NP    sitaGLIPtin-metformin (JANUMET) 50-1000 MG tablet Take 1 tablet by mouth 2 (two) times daily with a meal. 05/27/19  Yes Kerin Perna, NP  baclofen (LIORESAL) 10 MG tablet Take 1 tablet (10 mg total) by mouth 3 (three) times daily. Patient not taking: Reported on 09/22/2019 07/16/19 09/22/19  Courtney Heys, MD  blood glucose meter kit and supplies Dispense based on patient and insurance preference. Use up to four times daily as directed. (FOR ICD-10 E10.9, E11.9). 05/27/19   Kerin Perna, NP  collagenase (SANTYL) ointment Apply 1 application topically daily. Apply to eschar area black color until seen by wound care center Patient not taking: Reported on 09/22/2019 09/07/19   Kerin Perna, NP  gabapentin (NEURONTIN) 600 MG tablet Take 1 tablet (600 mg total) by mouth 3 (three) times daily. Patient not taking: Reported on 09/22/2019 07/16/19   Lovorn, Jinny Blossom, MD  oxyCODONE (OXY IR/ROXICODONE) 5 MG immediate release tablet TAKE 1 TABLET BY MOUTH EVERY 6 HOURS AS NEEDED FOR SEVERE PAIN Patient not taking: Reported on 09/22/2019 02/25/19   Britt Bottom, MD    Allergies    Patient has no known allergies.  Review of Systems   Review of Systems  Constitutional: Positive for chills and fever (subjective).  Respiratory: Negative for cough and shortness of breath.   Cardiovascular: Positive for leg swelling. Negative for chest pain.  Gastrointestinal: Negative for abdominal pain, nausea and vomiting.  Genitourinary: Negative for dysuria and hematuria.  Neurological: Negative for headaches.  All other systems reviewed and are negative.   Physical Exam Updated Vital Signs BP 126/82   Pulse 62   Temp (!) 97.4 F (36.3 C) (Oral)   Resp 18   SpO2 98%   Physical Exam Vitals and nursing note reviewed.  Constitutional:      Appearance: Normal appearance. He is well-developed.  HENT:     Head: Normocephalic and atraumatic.  Eyes:     General: Lids are normal.      Conjunctiva/sclera: Conjunctivae normal.     Pupils: Pupils are equal, round, and reactive to light.  Cardiovascular:     Rate and Rhythm: Normal rate and regular rhythm.     Pulses: Normal pulses.          Dorsalis pedis pulses are 2+ on the right side and 2+ on the left side.     Heart sounds: Normal heart sounds. No murmur heard.  No friction rub. No gallop.   Pulmonary:     Effort: Pulmonary effort is normal.     Breath sounds: Normal breath sounds.     Comments: Lungs clear to auscultation bilaterally.  Symmetric chest rise.  No wheezing, rales, rhonchi. Abdominal:     Palpations: Abdomen is soft. Abdomen is not rigid.     Tenderness: There is no abdominal  tenderness. There is no guarding.  Musculoskeletal:        General: Normal range of motion.     Cervical back: Full passive range of motion without pain.     Comments: 2+ pitting edema noted to the bilateral feet.  No overlying warmth, erythema.  Edema noted diffusely to bilateral lower extremities.  No overlying warmth, erythema.  Skin:    General: Skin is warm and dry.     Capillary Refill: Capillary refill takes less than 2 seconds.     Comments: Areas of discoloration, eschar noted to the plantar surface of the left foot.  Small quarter sized wound noted to the posterior lateral aspect of the left tib-fib with some surrounding warmth, erythema.  No active drainage.  Areas of discoloration, eschar noted to the lateral plantar surface of the left foot.  There are some mild yellow drainage that is malodorous.  Wound noted to the right heel with erythema noted.  Neurological:     Mental Status: He is alert and oriented to person, place, and time.  Psychiatric:        Speech: Speech normal.              ED Results / Procedures / Treatments   Labs (all labs ordered are listed, but only abnormal results are displayed) Labs Reviewed  COMPREHENSIVE METABOLIC PANEL - Abnormal; Notable for the following components:       Result Value   Potassium 3.4 (*)    Glucose, Bld 176 (*)    BUN 5 (*)    Creatinine, Ser 0.43 (*)    Albumin 3.2 (*)    All other components within normal limits  URINALYSIS, ROUTINE W REFLEX MICROSCOPIC - Abnormal; Notable for the following components:   Color, Urine AMBER (*)    APPearance HAZY (*)    Leukocytes,Ua TRACE (*)    Bacteria, UA RARE (*)    All other components within normal limits  SARS CORONAVIRUS 2 BY RT PCR (HOSPITAL ORDER, [facility])  LACTIC ACID, PLASMA  CBC WITH DIFFERENTIAL/PLATELET  HIV ANTIBODY (ROUTINE TESTING W REFLEX)  SEDIMENTATION RATE  C-REACTIVE PROTEIN    EKG None  Radiology DG Tibia/Fibula Left  Result Date: 09/22/2019 CLINICAL DATA:  Ulcerations.  Wheelchair bound. EXAM: LEFT TIBIA AND FIBULA - 2 VIEW COMPARISON:  None. FINDINGS: No acute osseous findings in the tibia fibula. No erosion. Disuse osteopenia within the calcaneus and midfoot. IMPRESSION: 1. No acute findings in the tibia fibula. 2. Disuse osteopenia within the calcaneus and midfoot Electronically Signed   By: Suzy Bouchard M.D.   On: 09/22/2019 12:05   DG Foot Complete Left  Result Date: 09/22/2019 CLINICAL DATA:  Foot ulcers EXAM: LEFT FOOT - COMPLETE 3+ VIEW COMPARISON:  None. FINDINGS: Osteopenia. No fracture or dislocation of the left foot. Joint spaces are preserved. Diffuse soft tissue edema about the foot and ankle. IMPRESSION: 1.  Osteopenia. No fracture or dislocation of the left foot. 2.  No radiographic evidence of osteomyelitis. 3. Diffuse soft tissue edema about the foot and ankle. Electronically Signed   By: Eddie Candle M.D.   On: 09/22/2019 12:00   DG Foot Complete Right  Result Date: 09/22/2019 CLINICAL DATA:  Foot ulcerations. Wounds over bilateral feet and posterior distal left tib fib. EXAM: RIGHT FOOT COMPLETE - 3+ VIEW COMPARISON:  None. FINDINGS: Marked diffuse soft tissue swelling is identified. The bones appear diffusely  osteopenic. No acute fracture or dislocation identified. No focal  bone erosions. IMPRESSION: 1. Marked diffuse soft tissue swelling concerning for diffuse cellulitis 2. No focal bone erosions identified to suggest osteomyelitis. Electronically Signed   By: Kerby Moors M.D.   On: 09/22/2019 11:58    Procedures Procedures (including critical care time)  Medications Ordered in ED Medications  enoxaparin (LOVENOX) injection 40 mg (has no administration in time range)  sodium chloride flush (NS) 0.9 % injection 3 mL (has no administration in time range)  clindamycin (CLEOCIN) IVPB 600 mg (has no administration in time range)  albuterol (PROVENTIL) (2.5 MG/3ML) 0.083% nebulizer solution 2.5 mg (has no administration in time range)  clindamycin (CLEOCIN) IVPB 600 mg (0 mg Intravenous Stopped 09/22/19 1355)  potassium chloride SA (KLOR-CON) CR tablet 20 mEq (20 mEq Oral Given 09/22/19 1358)    ED Course  I have reviewed the triage vital signs and the nursing notes.  Pertinent labs & imaging results that were available during my care of the patient were reviewed by me and considered in my medical decision making (see chart for details).    MDM Rules/Calculators/A&P                          54 year old male with past medical history of diabetes, quadriplegia who presents for discoloration of the wounds on his bilateral feet x1 month.  With some subjective fever/chills.  Has had drainage vaginal splint.  Sling was sent to ED for further evaluation.  On initial ED arrival, he is afebrile, nontoxic-appearing.  Vitals are stable.  On exam, he has bilateral lower extremity edema.  No overlying warmth or erythema.  Testicles feeding the dorsal surface of bilateral feet.  Plantar surface of both feet had discoloration of the skin and eschar noted.  There is some mild drainage noted on the bandage.  No active purulent drainage.  Concern for pressure ulcers versus infection.  History/physical exam reviewed  for ischemic limb, DVT labs ordered at triage.  We will plan for x-rays to evaluate for any osseous infection.  CMP shows potassium 3.4.  BUN 5, creatinine 0.43.  UA shows trace leukocytes, pyuria.  CBC shows no leukocytosis or anemia.  Lactic acid normal at 1.4.  X-ray shows no evidence of osteomyelitis.  There is some questionable soft tissue swelling concerning for cellulitis.  He is already been on antibiotics and a cream from [place].  Given worsening concerns as well as history of diabetes, will start IV clindamycin and admit for wound debridement, continue antibiotics.  Discussed patient with Dr. Tamala Julian [facility]) who accepts patient for admission.   Portions of this note were generated with Lobby[employer]. Dictation errors may occur despite best attempts at proofreading.   Final Clinical Impression(s) / ED Diagnoses Final diagnoses:  Cellulitis of right lower extremity  Cellulitis of lower extremity, unspecified laterality  Diabetic foot infection [facility])    Rx / DC Orders ED Discharge Orders    None       Desma Mcgregor 09/22/19 1515    Little, Wenda Overland, MD 09/26/19 1353

## 2019-09-22 NOTE — H&P (Signed)
History and Physical    [name] HCW:[id number] DOB: 1965/06/03 DOA: 09/21/2019  Referring MD/NP/PA: Simonne Martinet, PA-C PCP: Kerin Perna, NP  Patient coming from: home  Chief Complaint: Wounds on his feet  I have personally briefly reviewed patient's old medical records in [place]   HPI: [name] is a 54 y.o. male with medical history significant of quadriplegia and diabetes mellitus type 2 presents for worsening ulcers bilateral feet.  History is obtained with use of Spanish interpretive services.  Symptoms have been present for at least 1 month now.  Initially thought to have been provoked after getting new shoes.  He had been seen in a clinic 3-4 weeks ago and had been given some oral antibiotics at that time which he completed.  However, since plain antibiotics he has noted that the ulcerations have been getting worse.  The ulcerations have been draining clearish yellow fluid with some blood present.  Notes associated symptoms of chills, neck pain, and nausea.   his blood sugars have been good and his last hemoglobin A1c was less than 7 when checked just last month.  He was evaluated by his primary care provider on 7/26 and prescribed collagenase topical ointment and was referred to the wound care center.  Denies having any pain/feeling in his feet, shortness of breath, cough, vomiting, or diarrhea.  ED Course: Upon admission to the emergency department progressing to be afebrile, pulse 58-71, and all other vital signs stable.  Labs significant for potassium 3.4, glucose 176, and lactic acid 1.4.  X-ray imaging of the lower extremities showed soft tissue swelling without signs of bone involvement.  Patient was started on empiric antibiotics clindamycin.  Review of Systems  Constitutional: Positive for chills. Negative for fever.  HENT: Negative for congestion and sinus pain.   Eyes: Negative for double vision and photophobia.  Respiratory: Negative for sputum  production and shortness of breath.   Cardiovascular: Positive for leg swelling. Negative for chest pain.  Gastrointestinal: Positive for nausea. Negative for abdominal pain, constipation and vomiting.  Genitourinary: Negative for dysuria and hematuria.  Musculoskeletal: Positive for neck pain. Negative for falls.  Neurological: Negative for loss of consciousness.  Psychiatric/Behavioral: Negative for substance abuse.    Past Medical History:  Diagnosis Date  . Diabetes mellitus without complication ([place])   . History of cervical fracture     Past Surgical History:  Procedure Laterality Date  . CERVICAL SPINE SURGERY       reports that he has never smoked. He has never used smokeless tobacco. He reports current alcohol use. He reports that he does not use drugs.  No Known Allergies  Family History  Problem Relation Age of Onset  . Diabetes Mother   . Diabetes Father     Prior to Admission medications   Medication Sig Start Date End Date Taking? Authorizing Provider  acetaminophen (TYLENOL) 500 MG tablet Take 500 mg by mouth every 6 (six) hours as needed.    [provider]  baclofen (LIORESAL) 10 MG tablet Take 1 tablet (10 mg total) by mouth 3 (three) times daily. 07/16/19 08/15/19  Courtney Heys, MD  blood glucose meter kit and supplies Dispense based on patient and insurance preference. Use up to four times daily as directed. (FOR ICD-10 E10.9, E11.9). 05/27/19   Kerin Perna, NP  collagenase (SANTYL) ointment Apply 1 application topically daily. Apply to eschar area black color until seen by wound care center 09/07/19   Kerin Perna, NP  ELIQUIS 5 MG TABS tablet TAKE 1 TABLET BY MOUTH 2 TIMES DAILY 09/07/19   Lovorn, Jinny Blossom, MD  FLUoxetine (PROZAC) 20 MG capsule Take 1 capsule (20 mg total) by mouth daily. 05/27/19   Kerin Perna, NP  gabapentin (NEURONTIN) 600 MG tablet Take 1 tablet (600 mg total) by mouth 3 (three) times daily. 07/16/19   Lovorn, Jinny Blossom,  MD  oxyCODONE (OXY IR/ROXICODONE) 5 MG immediate release tablet TAKE 1 TABLET BY MOUTH EVERY 6 HOURS AS NEEDED FOR SEVERE PAIN 02/25/19   Sater, Nanine Means, MD  senna (SENOKOT) 8.6 MG tablet Take 8.6 mg by mouth. 09/25/18   [provider]  sildenafil (VIAGRA) 100 MG tablet TAKE 1 TABLET BY MOUTH EVERY DAY AS NEEDED FOR ERECTILE DYSFUNCTION 06/07/19   Lovorn, Jinny Blossom, MD  simethicone (MYLICON) 80 MG chewable tablet Chew 1 tablet (80 mg total) by mouth every 6 (six) hours as needed. 05/27/19   Kerin Perna, NP  sitaGLIPtin-metformin (JANUMET) 50-1000 MG tablet Take 1 tablet by mouth 2 (two) times daily with a meal. 05/27/19   Kerin Perna, NP    Physical Exam:  Constitutional: Obese male currently in NAD, calm, comfortable Vitals:   09/21/19 2006 09/22/19 0022 09/22/19 0536 09/22/19 0824  BP: (!) 124/92 (!) 119/96 120/72 119/76  Pulse: (!) 59 (!) 58 (!) 58 (!) 57  Resp: '14 18 16 18  ' Temp: (!) 97.5 F (36.4 C) 97.7 F (36.5 C) (!) 97.5 F (36.4 C) (!) 97.4 F (36.3 C)  TempSrc: Oral Oral Oral Oral  SpO2: 98% 97% 97% 98%   Eyes: PERRL, lids and conjunctivae normal ENMT: Mucous membranes are moist. Posterior pharynx clear of any exudate or lesions.Normal dentition.  Neck: normal, supple, no masses, no thyromegaly Respiratory: clear to auscultation bilaterally, no wheezing, no crackles. Normal respiratory effort. No accessory muscle use.  Cardiovascular: Regular rate and rhythm, no murmurs / rubs / gallops.  +1-2 pitting bilateral extremity edema. 2+ pedal pulses. No carotid bruits.  Abdomen: no tenderness, no masses palpated. No hepatosplenomegaly. Bowel sounds positive.  Musculoskeletal: no clubbing / cyanosis. No joint deformity upper and lower extremities. Good ROM, no contractures. Normal muscle tone.  Skin: Large ulceration noted on the lateral aspect of the right foot with eschar and ulceration on the heel.  Smaller ulceration noted on the left lateral aspect of the  foot.  Patient also with ulceration with surrounding erythema of the lateral aspect of the mid tibial region on the left leg.  Neurologic: CN 2-12 grossly intact.  Paraplegia. Psychiatric: Normal judgment and insight. Alert and oriented x 3. Normal mood.     Labs on Admission: I have personally reviewed following labs and imaging studies  CBC: Recent Labs  Lab 09/21/19 1513  WBC 5.7  NEUTROABS 3.1  HGB 13.5  HCT 42.1  MCV 94.4  PLT 353   Basic Metabolic Panel: Recent Labs  Lab 09/21/19 1513  NA 141  K 3.4*  CL 106  CO2 26  GLUCOSE 176*  BUN 5*  CREATININE 0.43*  CALCIUM 8.9   GFR: CrCl cannot be calculated (Unknown ideal weight.). Liver Function Tests: Recent Labs  Lab 09/21/19 1513  AST 18  ALT 18  ALKPHOS 63  BILITOT 0.5  PROT 6.7  ALBUMIN 3.2*   No results for input(s): LIPASE, AMYLASE in the last 168 hours. No results for input(s): AMMONIA in the last 168 hours. Coagulation Profile: No results for input(s): INR, PROTIME in the last 168 hours. Cardiac Enzymes: No  results for input(s): CKTOTAL, CKMB, CKMBINDEX, TROPONINI in the last 168 hours. BNP (last 3 results) No results for input(s): PROBNP in the last 8760 hours. HbA1C: No results for input(s): HGBA1C in the last 72 hours. CBG: No results for input(s): GLUCAP in the last 168 hours. Lipid Profile: No results for input(s): CHOL, HDL, LDLCALC, TRIG, CHOLHDL, LDLDIRECT in the last 72 hours. Thyroid Function Tests: No results for input(s): TSH, T4TOTAL, FREET4, T3FREE, THYROIDAB in the last 72 hours. Anemia Panel: No results for input(s): VITAMINB12, FOLATE, FERRITIN, TIBC, IRON, RETICCTPCT in the last 72 hours. Urine analysis:    Component Value Date/Time   COLORURINE AMBER (A) 09/21/2019 2010   APPEARANCEUR HAZY (A) 09/21/2019 2010   LABSPEC 1.025 09/21/2019 2010   PHURINE 6.0 09/21/2019 2010   GLUCOSEU NEGATIVE 09/21/2019 2010   HGBUR NEGATIVE 09/21/2019 2010   [place] NEGATIVE  09/21/2019 2010   [place] NEGATIVE 09/21/2019 2010   PROTEINUR NEGATIVE 09/21/2019 2010   NITRITE NEGATIVE 09/21/2019 2010   LEUKOCYTESUR TRACE (A) 09/21/2019 2010   Sepsis Labs: No results found for this or any previous visit (from the past 240 hour(s)).   Radiological Exams on Admission: DG Tibia/Fibula Left  Result Date: 09/22/2019 CLINICAL DATA:  Ulcerations.  Wheelchair bound. EXAM: LEFT TIBIA AND FIBULA - 2 VIEW COMPARISON:  None. FINDINGS: No acute osseous findings in the tibia fibula. No erosion. Disuse osteopenia within the calcaneus and midfoot. IMPRESSION: 1. No acute findings in the tibia fibula. 2. Disuse osteopenia within the calcaneus and midfoot Electronically Signed   By: Suzy Bouchard M.D.   On: 09/22/2019 12:05   DG Foot Complete Left  Result Date: 09/22/2019 CLINICAL DATA:  Foot ulcers EXAM: LEFT FOOT - COMPLETE 3+ VIEW COMPARISON:  None. FINDINGS: Osteopenia. No fracture or dislocation of the left foot. Joint spaces are preserved. Diffuse soft tissue edema about the foot and ankle. IMPRESSION: 1.  Osteopenia. No fracture or dislocation of the left foot. 2.  No radiographic evidence of osteomyelitis. 3. Diffuse soft tissue edema about the foot and ankle. Electronically Signed   By: Eddie Candle M.D.   On: 09/22/2019 12:00   DG Foot Complete Right  Result Date: 09/22/2019 CLINICAL DATA:  Foot ulcerations. Wounds over bilateral feet and posterior distal left tib fib. EXAM: RIGHT FOOT COMPLETE - 3+ VIEW COMPARISON:  None. FINDINGS: Marked diffuse soft tissue swelling is identified. The bones appear diffusely osteopenic. No acute fracture or dislocation identified. No focal bone erosions. IMPRESSION: 1. Marked diffuse soft tissue swelling concerning for diffuse cellulitis 2. No focal bone erosions identified to suggest osteomyelitis. Electronically Signed   By: Kerby Moors M.D.   On: 09/22/2019 11:58     Assessment/Plan Pressure ulcer with cellulitis: Acute.  Patient  presents for worsening nonhealing ulcerations of the bilateral lower extremities.  X-ray imaging concerning for soft tissue inflammation without signs of bone involvement.  Patient started antibiotics of clindamycin. -Admit to MedSurg bed -Add on ESR and CRP -Prealbumin in a.m.  -Check ABI with and without TBI -Continue empiric antibiotics of clindamycin -Wound care consult  -Consider need to formally consult orthopedic surgery if recommended following wound care consult for evaluation  Diabetes mellitus type 2: Patient appears to be relatively well controlled as last hemoglobin A1c noted 6.5 on 09/06/2019.  Home medications included sitagliptin-metformin 50-1000 mg twice daily. 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  Home medications include baclofen 1 tablet 3 times daily -Low-air-loss mattress replacement -Continue baclofen   -In and out cath as needed  Hypokalemia: Acute.  Potassium just mildly low at 3.4. 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  He appears to still be on Eliquis. -Continue Eliquis  DVT prophylaxis: Eliquis Code Status: Full Family Communication: Wife updated at bedside Disposition Plan: Likely discharge home in 2 to 3 days. Consults called: Wound care consult Admission status: Inpatient   Norval Morton MD [facility] Pager [phone]   If 7PM-7AM, please contact night-coverage [email or web address] Password [email or web address]  09/22/2019, 1:19 PM

## 2019-09-22 NOTE — ED Notes (Signed)
Pt in radiology 

## 2019-09-23 ENCOUNTER — Encounter (HOSPITAL_COMMUNITY): Payer: Self-pay | Admitting: Internal Medicine

## 2019-09-23 DIAGNOSIS — G8250 Quadriplegia, unspecified: Secondary | ICD-10-CM

## 2019-09-23 DIAGNOSIS — L8990 Pressure ulcer of unspecified site, unspecified stage: Secondary | ICD-10-CM | POA: Diagnosis not present

## 2019-09-23 DIAGNOSIS — L03119 Cellulitis of unspecified part of limb: Secondary | ICD-10-CM

## 2019-09-23 LAB — CBC
HCT: 40.4 % (ref 39.0–52.0)
Hemoglobin: 12.9 g/dL — ABNORMAL LOW (ref 13.0–17.0)
MCH: 29.6 pg (ref 26.0–34.0)
MCHC: 31.9 g/dL (ref 30.0–36.0)
MCV: 92.7 fL (ref 80.0–100.0)
Platelets: 257 10*3/uL (ref 150–400)
RBC: 4.36 MIL/uL (ref 4.22–5.81)
RDW: 12.3 % (ref 11.5–15.5)
WBC: 4.6 10*3/uL (ref 4.0–10.5)
nRBC: 0 % (ref 0.0–0.2)

## 2019-09-23 LAB — BASIC METABOLIC PANEL
Anion gap: 9 (ref 5–15)
BUN: 5 mg/dL — ABNORMAL LOW (ref 6–20)
CO2: 27 mmol/L (ref 22–32)
Calcium: 9.2 mg/dL (ref 8.9–10.3)
Chloride: 105 mmol/L (ref 98–111)
Creatinine, Ser: 0.43 mg/dL — ABNORMAL LOW (ref 0.61–1.24)
GFR calc Af Amer: >60 mL/min (ref >60)
GFR calc non Af Amer: >60 mL/min (ref >60)
Glucose, Bld: 122 mg/dL — ABNORMAL HIGH (ref 70–99)
Potassium: 3.6 mmol/L (ref 3.5–5.1)
Sodium: 141 mmol/L (ref 135–145)

## 2019-09-23 LAB — PREALBUMIN: Prealbumin: 12.9 mg/dL — ABNORMAL LOW (ref 18–38)

## 2019-09-23 LAB — GLUCOSE, CAPILLARY
Glucose-Capillary: 149 mg/dL — ABNORMAL HIGH (ref 70–99)
Glucose-Capillary: 117 mg/dL — ABNORMAL HIGH (ref 70–99)
Glucose-Capillary: 181 mg/dL — ABNORMAL HIGH (ref 70–99)
Glucose-Capillary: 145 mg/dL — ABNORMAL HIGH (ref 70–99)
Glucose-Capillary: 128 mg/dL — ABNORMAL HIGH (ref 70–99)

## 2019-09-23 MED ORDER — DIPHENHYDRAMINE HCL 25 MG PO CAPS
25.0000 mg | ORAL_CAPSULE | Freq: Once | ORAL | Status: AC
  Administered 2019-09-23: 25 mg via ORAL
  Filled 2019-09-23: qty 1

## 2019-09-23 MED ORDER — CEFAZOLIN SODIUM-DEXTROSE 2-4 GM/100ML-% IV SOLN
2.0000 g | Freq: Three times a day (TID) | INTRAVENOUS | Status: DC
  Administered 2019-09-23 – 2019-09-25 (×6): 2 g via INTRAVENOUS
  Filled 2019-09-23 (×6): qty 100

## 2019-09-23 MED ORDER — SENNOSIDES-DOCUSATE SODIUM 8.6-50 MG PO TABS
1.0000 | ORAL_TABLET | Freq: Two times a day (BID) | ORAL | Status: DC
  Administered 2019-09-23 – 2019-09-25 (×4): 1 via ORAL
  Filled 2019-09-23 (×4): qty 1

## 2019-09-23 NOTE — Consult Note (Signed)
Reason for Consult:Bilateral LE wounds Referring Physician: P Tyson Babinskiatel  [name] is an 54 y.o. male.  HPI: [name] was admitted yesterday with worsening bilateral lower extremity wounds. They have been present for about a month and are not getting better. They don't bother him per se as he is insensate following paraplegia after a fall. He was seen for this about 3 weeks ago and [facility] and rx collagenase but was unable to afford the latter.   Past Medical History:  Diagnosis Date   Diabetes mellitus without complication (HCC)    History of cervical fracture     Past Surgical History:  Procedure Laterality Date   CERVICAL SPINE SURGERY      Family History  Problem Relation Age of Onset   Diabetes Mother    Diabetes Father     Social History:  reports that he has never smoked. He has never used smokeless tobacco. He reports current alcohol use. He reports that he does not use drugs.  Allergies: No Known Allergies  Medications: I have reviewed the patient's current medications.  Results for orders placed or performed during the hospital encounter of 09/21/19 (from the past 48 hour(s))  Lactic acid, plasma     Status: None   Collection Time: 09/21/19  3:13 PM  Result Value Ref Range   Lactic Acid, Venous 1.4 0.5 - 1.9 mmol/L    Comment: Performed at [facility], [address] [place], [place] [address]  Comprehensive metabolic panel     Status: Abnormal   Collection Time: 09/21/19  3:13 PM  Result Value Ref Range   Sodium 141 135 - 145 mmol/L   Potassium 3.4 (L) 3.5 - 5.1 mmol/L   Chloride 106 98 - 111 mmol/L   CO2 26 22 - 32 mmol/L   Glucose, Bld 176 (H) 70 - 99 mg/dL    Comment: Glucose reference range applies only to samples taken after fasting for at least 8 hours.   BUN 5 (L) 6 - 20 mg/dL   Creatinine, Ser 6.040.43 (L) 0.61 - 1.24 mg/dL   Calcium 8.9 8.9 - 54.010.3 mg/dL   Total Protein 6.7 6.5 - 8.1 g/dL   Albumin 3.2 (L) 3.5 - 5.0 g/dL   AST  18 15 - 41 U/L   ALT 18 0 - 44 U/L   Alkaline Phosphatase 63 38 - 126 U/L   Total Bilirubin 0.5 0.3 - 1.2 mg/dL   GFR calc non Af Amer >60 >60 mL/min   GFR calc Af Amer >60 >60 mL/min   Anion gap 9 5 - 15    Comment: Performed at [facility], [address] [place], [place] [address]  CBC with Differential     Status: None   Collection Time: 09/21/19  3:13 PM  Result Value Ref Range   WBC 5.7 4.0 - 10.5 K/uL   RBC 4.46 4.22 - 5.81 MIL/uL   Hemoglobin 13.5 13.0 - 17.0 g/dL   HCT 14.742.1 39 - 52 %   MCV 94.4 80.0 - 100.0 fL   MCH 30.3 26.0 - 34.0 pg   MCHC 32.1 30.0 - 36.0 g/dL   RDW 82.912.3 56.211.5 - 13.015.5 %   Platelets 277 150 - 400 K/uL   nRBC 0.0 0.0 - 0.2 %   Neutrophils Relative % 53 %   Neutro Abs 3.1 1.7 - 7.7 K/uL   Lymphocytes Relative 30 %   Lymphs Abs 1.7 0.7 - 4.0 K/uL  Monocytes Relative 8 %   Monocytes Absolute 0.4 0 - 1 K/uL   Eosinophils Relative 8 %   Eosinophils Absolute 0.4 0 - 0 K/uL   Basophils Relative 1 %   Basophils Absolute 0.1 0 - 0 K/uL   Immature Granulocytes 0 %   Abs Immature Granulocytes 0.02 0.00 - 0.07 K/uL    Comment: Performed at [facility], [address] [place], [place] [address]  Urinalysis, Routine w reflex microscopic Urine, Clean Catch     Status: Abnormal   Collection Time: 09/21/19  8:10 PM  Result Value Ref Range   Color, Urine AMBER (A) YELLOW    Comment: BIOCHEMICALS MAY BE AFFECTED BY COLOR   APPearance HAZY (A) CLEAR   Specific Gravity, Urine 1.025 1.005 - 1.030   pH 6.0 5.0 - 8.0   Glucose, UA NEGATIVE NEGATIVE mg/dL   Hgb urine dipstick NEGATIVE NEGATIVE   Bilirubin Urine NEGATIVE NEGATIVE   Ketones, ur NEGATIVE NEGATIVE mg/dL   Protein, ur NEGATIVE NEGATIVE mg/dL   Nitrite NEGATIVE NEGATIVE   Leukocytes,Ua TRACE (A) NEGATIVE   RBC / HPF 0-5 0 - 5 RBC/hpf   WBC, UA 6-10 0 - 5 WBC/hpf   Bacteria, UA RARE (A) NONE SEEN   Squamous Epithelial / LPF 0-5 0 - 5   Mucus PRESENT     Comment: Performed at [facility], [address], [place], [place] [address]  SARS Coronavirus 2 by RT PCR (hospital order, performed in [facility] hospital lab) Nasopharyngeal Nasopharyngeal Swab     Status: None   Collection Time: 09/22/19  2:30 PM   Specimen: Nasopharyngeal Swab  Result Value Ref Range   SARS Coronavirus 2 NEGATIVE NEGATIVE    Comment: (NOTE) SARS-CoV-2 target nucleic acids are NOT DETECTED.  The SARS-CoV-2 RNA is generally detectable in upper and lower respiratory specimens during the acute phase of infection. The lowest concentration of SARS-CoV-2 viral copies this assay can detect is 250 copies / mL. A negative result does not preclude SARS-CoV-2 infection and should not be used as the sole basis for treatment or other patient management decisions.  A negative result may occur with improper specimen collection / handling, submission of specimen other than nasopharyngeal swab, presence of viral mutation(s) within the areas targeted by this assay, and inadequate number of viral copies (<250 copies / mL). A negative result must be combined with clinical observations, patient history, and epidemiological information.  Fact Sheet for Patients:   BoilerBrush.com.cy  Fact Sheet for Healthcare Providers: https://pope.com/  This test is not yet approved or  cleared by the [place] FDA and has been authorized for detection and/or diagnosis of SARS-CoV-2 by FDA under an Emergency Use Authorization (EUA).  This EUA will remain in effect (meaning this test can be used) for the duration of the COVID-19 declaration under Section 564(b)(1) of the Act, 21 U.S.C. section 360bbb-3(b)(1), unless the authorization is terminated or revoked sooner.  Performed at [facility], [address], [place], [place] [address]   CBG monitoring, ED     Status: Abnormal   Collection Time: 09/22/19  5:18 PM  Result Value Ref Range   Glucose-Capillary 134  (H) 70 - 99 mg/dL    Comment: Glucose reference range applies only to samples taken after fasting for at least 8 hours.  HIV Antibody (routine testing w rflx)     Status: None   Collection Time: 09/22/19  8:30 PM  Result Value Ref  Range   HIV Screen 4th Generation wRfx Non Reactive Non Reactive    Comment: Performed at [facility], [address], [place], [place] [address]  Sedimentation rate     Status: Abnormal   Collection Time: 09/22/19  8:30 PM  Result Value Ref Range   Sed Rate 39 (H) 0 - 16 mm/hr    Comment: Performed at [facility], [address] [place], [place] [address]  C-reactive protein     Status: Abnormal   Collection Time: 09/22/19  8:30 PM  Result Value Ref Range   CRP 1.7 (H) <1.0 mg/dL    Comment: Performed at [facility], [address] [place], [place] [address]  Glucose, capillary     Status: Abnormal   Collection Time: 09/22/19  8:47 PM  Result Value Ref Range   Glucose-Capillary 149 (H) 70 - 99 mg/dL    Comment: Glucose reference range applies only to samples taken after fasting for at least 8 hours.  CBC     Status: Abnormal   Collection Time: 09/23/19  3:19 AM  Result Value Ref Range   WBC 4.6 4.0 - 10.5 K/uL   RBC 4.36 4.22 - 5.81 MIL/uL   Hemoglobin 12.9 (L) 13.0 - 17.0 g/dL   HCT 91.4 39 - 52 %   MCV 92.7 80.0 - 100.0 fL   MCH 29.6 26.0 - 34.0 pg   MCHC 31.9 30.0 - 36.0 g/dL   RDW 78.2 95.6 - 21.3 %   Platelets 257 150 - 400 K/uL   nRBC 0.0 0.0 - 0.2 %    Comment: Performed at [facility], [address], [place], [place] [address]  Basic metabolic panel     Status: Abnormal   Collection Time: 09/23/19  3:19 AM  Result Value Ref Range   Sodium 141 135 - 145 mmol/L   Potassium 3.6 3.5 - 5.1 mmol/L   Chloride 105 98 - 111 mmol/L   CO2 27 22 - 32 mmol/L   Glucose, Bld 122 (H) 70 - 99 mg/dL    Comment: Glucose reference range applies only to samples taken after fasting for at least 8 hours.   BUN 5 (L) 6 - 20 mg/dL    Creatinine, Ser 8.46 (L) 0.61 - 1.24 mg/dL   Calcium 9.2 8.9 - 96.2 mg/dL   GFR calc non Af Amer >60 >60 mL/min   GFR calc Af Amer >60 >60 mL/min   Anion gap 9 5 - 15    Comment: Performed at [facility], [address], [place], [place] [address]  Prealbumin     Status: Abnormal   Collection Time: 09/23/19  3:19 AM  Result Value Ref Range   Prealbumin 12.9 (L) 18 - 38 mg/dL    Comment: Performed at [facility], [address], [place], [place] [address]  Glucose, capillary     Status: Abnormal   Collection Time: 09/23/19  6:40 AM  Result Value Ref Range   Glucose-Capillary 117 (H) 70 - 99 mg/dL    Comment: Glucose reference range applies only to samples taken after fasting for at least 8 hours.  Glucose, capillary     Status: Abnormal   Collection Time: 09/23/19 11:06 AM  Result Value Ref Range   Glucose-Capillary 181 (H) 70 - 99 mg/dL    Comment: Glucose reference range applies only to samples taken after fasting for at least 8 hours.    DG Tibia/Fibula  Left  Result Date: 09/22/2019 CLINICAL DATA:  Ulcerations.  Wheelchair bound. EXAM: LEFT TIBIA AND FIBULA - 2 VIEW COMPARISON:  None. FINDINGS: No acute osseous findings in the tibia fibula. No erosion. Disuse osteopenia within the calcaneus and midfoot. IMPRESSION: 1. No acute findings in the tibia fibula. 2. Disuse osteopenia within the calcaneus and midfoot Electronically Signed   By: Genevive Bi M.D.   On: 09/22/2019 12:05   DG Foot Complete Left  Result Date: 09/22/2019 CLINICAL DATA:  Foot ulcers EXAM: LEFT FOOT - COMPLETE 3+ VIEW COMPARISON:  None. FINDINGS: Osteopenia. No fracture or dislocation of the left foot. Joint spaces are preserved. Diffuse soft tissue edema about the foot and ankle. IMPRESSION: 1.  Osteopenia. No fracture or dislocation of the left foot. 2.  No radiographic evidence of osteomyelitis. 3. Diffuse soft tissue edema about the foot and ankle. Electronically Signed   By: Lauralyn Primes  M.D.   On: 09/22/2019 12:00   DG Foot Complete Right  Result Date: 09/22/2019 CLINICAL DATA:  Foot ulcerations. Wounds over bilateral feet and posterior distal left tib fib. EXAM: RIGHT FOOT COMPLETE - 3+ VIEW COMPARISON:  None. FINDINGS: Marked diffuse soft tissue swelling is identified. The bones appear diffusely osteopenic. No acute fracture or dislocation identified. No focal bone erosions. IMPRESSION: 1. Marked diffuse soft tissue swelling concerning for diffuse cellulitis 2. No focal bone erosions identified to suggest osteomyelitis. Electronically Signed   By: Signa Kell M.D.   On: 09/22/2019 11:58    Review of Systems  Constitutional: Negative for chills, diaphoresis and fever.  HENT: Negative for ear discharge, ear pain, hearing loss and tinnitus.   Eyes: Negative for photophobia and pain.  Respiratory: Negative for cough and shortness of breath.   Cardiovascular: Negative for chest pain.  Gastrointestinal: Negative for abdominal pain, nausea and vomiting.  Genitourinary: Negative for dysuria, flank pain, frequency and urgency.  Musculoskeletal: Positive for joint swelling (BLE). Negative for arthralgias, back pain, myalgias and neck pain.  Skin: Positive for wound (BLE).  Neurological: Negative for dizziness and headaches.  Hematological: Does not bruise/bleed easily.  Psychiatric/Behavioral: The patient is not nervous/anxious.    Blood pressure 135/89, pulse 63, temperature 97.6 F (36.4 C), temperature source Oral, resp. rate 16, height 5\' 10"  (1.778 m), weight 113.6 kg, SpO2 98 %. Physical Exam Constitutional:      General: He is not in acute distress.    Appearance: He is well-developed. He is not diaphoretic.  HENT:     Head: Normocephalic and atraumatic.  Eyes:     General: No scleral icterus.       Right eye: No discharge.        Left eye: No discharge.     Conjunctiva/sclera: Conjunctivae normal.  Cardiovascular:     Rate and Rhythm: Normal rate and regular  rhythm.  Pulmonary:     Effort: Pulmonary effort is normal. No respiratory distress.  Musculoskeletal:     Cervical back: Normal range of motion.     Comments: BLE No traumatic wounds, ecchymosis, or rash  Ulcerations with thick eschar over bilateral plantar MTP joints, no surrounding erythema or malodor  No knee or ankle effusion  Knee stable to varus/ valgus and anterior/posterior stress  Sens DPN, SPN, TN absent (chronic)  Motor EHL, ext, flex, evers 0/5 (chronic)  DP 0, PT 0, 4+ edema  Skin:    General: Skin is warm and dry.  Neurological:     Mental Status: He is alert.  Psychiatric:  Behavior: Behavior normal.     Assessment/Plan: Bilateral lower extremity wounds -- Will check ABI's in addition to dopplers. If abnormal will need vascular surgery consult. Feel pt can be discharged with outpatient f/u to Dr. Lajoyce Corners or wound care center from orthopedic surgery standpoint. No need for surgical debridement this admission.    Freeman Caldron, PA-C Orthopedic Surgery [phone] 09/23/2019, 2:20 PM

## 2019-09-23 NOTE — Consult Note (Signed)
WOC Nurse Consult Note: Patient receiving care in [facility] 5N15.  Consult completed remotely after review of record, including images of wounds Reason for Consult: cellulitis with diabetic ulcers Wound type: see photos, bilateral foot and lower leg wound with eschar. Pressure Injury POA: Yes Measurement: To be provided by the bedside RN in the flowsheet section Wound bed: see photos Drainage (amount, consistency, odor)  Periwound: Dressing procedure/placement/frequency:  Iodine to all wounds, wrap with kerlex twice daily. Prevalon heel lift boots bilaterally. Monitor the wound area(s) for worsening of condition such as: Signs/symptoms of infection,  Increase in size,  Development of or worsening of odor, Development of pain, or increased pain at the affected locations.  Notify the medical team if any of these develop.  Thank you for the consult.  Discussed plan of care with Dr. Leodis Binet.  WOC nurse will not follow at this time.  Please re-consult the WOC team if needed.  Helmut Muster, RN, MSN, CWOCN, CNS-BC, pager [phone]

## 2019-09-23 NOTE — Plan of Care (Signed)

## 2019-09-23 NOTE — Progress Notes (Signed)
[facility] Progress Note  Patient: [name]    DGU:[id number]  DOA: 09/21/2019     Date of Service: the patient was seen and examined on 09/23/2019  Brief hospital course: Past medical history of quadriplegia, type II DM.  Presents with worsening pressure ulcers. Currently plan is continue IV antibiotics.  Assessment and Plan: 1.  Cellulitis bilateral lower extremity Pressure ulcers POA multiple Changing IV clindamycin to IV cefazolin. Check ABI as well as rule out worsening DVT. Continue with Eliquis. Wound care consulted currently recommending orthopedic consult. Appreciate orthopedic consultation as well.  Currently no intervention required.  Monitor. Outpatient follow-up with wound care.  2.  Type 2 diabetes mellitus, controlled. Continue sliding scale insulin. Holding oral hypoglycemic agents.  3.  Quadriplegia with neurogenic bladder. Spinal cord injury. Continue baclofen.  Continue in and out cath.  Monitor.  4.  Hypokalemia Resolved.  Monitor.  5.  Depression Continue home regimen.  6. H/o DVT IVC filter placement h/o Continue Eliquis.  7.  Obesity Body mass index is 35.93 kg/m.   Diet: Cardiac diet DVT Prophylaxis:    apixaban (ELIQUIS) tablet 5 mg    Advance goals of care discussion: Full code  Family Communication: family was present at bedside, at the time of interview.  The pt provided permission to discuss medical plan with the family. Opportunity was given to ask question and all questions were answered satisfactorily.   Disposition:  Status is: Inpatient  Remains inpatient appropriate because:IV treatments appropriate due to intensity of illness or inability to take PO   Dispo: The patient is from: Home              Anticipated d/c is to: Home              Anticipated d/c date is: 1 day              Patient currently is not medically stable to d/c.        Subjective: No nausea no vomiting.  No fever no chills.  Continues  to have concerns with his ulcers.  No fever no chills.  Physical Exam:  General: Appear in mild distress, no Rash; Oral Mucosa Clear, moist. no Abnormal Neck Mass Or lumps, Conjunctiva normal  Cardiovascular: S1 and S2 Present, no Murmur, Respiratory: good respiratory effort, Bilateral Air entry present and Clear to Auscultation, no Crackles, no wheezes Abdomen: Bowel Sound present, Soft and no tenderness Extremities: bilateral  Pedal edema, no calf tenderness Neurology: alert and oriented to time, place, and person affect flat in affect. no new focal deficit, chronic quadriplegia Gait not checked due to patient safety concerns  Vitals:   09/23/19 0756 09/23/19 0935 09/23/19 1430 09/23/19 1935  BP: 135/89 135/89 136/85 (!) 112/58  Pulse: 63 63 85 68  Resp: 16 16 17 17   Temp: 97.6 F (36.4 C) 97.6 F (36.4 C) 97.9 F (36.6 C) 98 F (36.7 C)  TempSrc: Oral Oral Oral Oral  SpO2: 98%  99% 97%  Weight:  113.6 kg    Height:  5\' 10"  (1.778 m)      Intake/Output Summary (Last 24 hours) at 09/23/2019 1944 Last data filed at 09/23/2019 1900 Gross per 24 hour  Intake 630 ml  Output 1700 ml  Net -1070 ml   Filed Weights   09/23/19 0935  Weight: 113.6 kg    Data Reviewed: I have personally reviewed and interpreted daily labs, tele strips, imagings as discussed above. I reviewed all nursing notes, pharmacy  notes, vitals, pertinent old records I have discussed plan of care as described above with RN and patient/family.  CBC: Recent Labs  Lab 09/21/19 1513 09/23/19 0319  WBC 5.7 4.6  NEUTROABS 3.1  --   HGB 13.5 12.9*  HCT 42.1 40.4  MCV 94.4 92.7  PLT 277 257   Basic Metabolic Panel: Recent Labs  Lab 09/21/19 1513 09/23/19 0319  NA 141 141  K 3.4* 3.6  CL 106 105  CO2 26 27  GLUCOSE 176* 122*  BUN 5* 5*  CREATININE 0.43* 0.43*  CALCIUM 8.9 9.2    Studies: No results found.  Scheduled Meds: . apixaban  5 mg Oral BID  . baclofen  5 mg Oral TID AC  .  bisacodyl  10 mg Rectal QHS  . FLUoxetine  10 mg Oral Daily  . insulin aspart  0-5 Units Subcutaneous QHS  . insulin aspart  0-9 Units Subcutaneous TID WC  . senna-docusate  1 tablet Oral BID  . simethicone  80 mg Oral TID PC  . sodium chloride flush  3 mL Intravenous Q12H   Continuous Infusions: .  ceFAZolin (ANCEF) IV Stopped (09/23/19 1435)   PRN Meds: acetaminophen, albuterol, polyvinyl alcohol  Time spent: 35 minutes  Author: Lynden Oxford, MD [facility] 09/23/2019 7:44 PM  To reach On-call, see care teams to locate the attending and reach out via [email or web address]. Between 7PM-7AM, please contact night-coverage If you still have difficulty reaching the attending provider, please page the [facility] (Director on Call) for [facility] on amion for assistance.

## 2019-09-24 ENCOUNTER — Inpatient Hospital Stay (HOSPITAL_COMMUNITY): Payer: Medicaid Other

## 2019-09-24 DIAGNOSIS — R609 Edema, unspecified: Secondary | ICD-10-CM

## 2019-09-24 DIAGNOSIS — L0390 Cellulitis, unspecified: Secondary | ICD-10-CM | POA: Diagnosis not present

## 2019-09-24 LAB — GLUCOSE, CAPILLARY
Glucose-Capillary: 115 mg/dL — ABNORMAL HIGH (ref 70–99)
Glucose-Capillary: 114 mg/dL — ABNORMAL HIGH (ref 70–99)
Glucose-Capillary: 131 mg/dL — ABNORMAL HIGH (ref 70–99)
Glucose-Capillary: 133 mg/dL — ABNORMAL HIGH (ref 70–99)

## 2019-09-24 MED ORDER — FUROSEMIDE 40 MG PO TABS
40.0000 mg | ORAL_TABLET | Freq: Every day | ORAL | Status: DC
  Administered 2019-09-24 – 2019-09-25 (×2): 40 mg via ORAL
  Filled 2019-09-24 (×2): qty 1

## 2019-09-24 MED ORDER — HYDROXYZINE HCL 25 MG PO TABS
25.0000 mg | ORAL_TABLET | Freq: Three times a day (TID) | ORAL | Status: DC | PRN
  Administered 2019-09-24: 25 mg via ORAL
  Filled 2019-09-24: qty 1

## 2019-09-24 NOTE — Progress Notes (Signed)
ABI & lower extremity venous has been completed.   Preliminary results in CV Proc.   Blanch Media 09/24/2019 12:07 PM

## 2019-09-24 NOTE — TOC Initial Note (Signed)
Transition of Care [facility]) - Initial/Assessment Note    Patient Details  Name: [name] MRN: [id number] Date of Birth: 03-13-1965  [facility] of [facility]) CM/SW Contact:    Epifanio Lesches, RN Phone Number: [phone] 09/24/2019, 4:09 PM  Clinical Narrative:                 Admitted with worsening bilat. LE /feet wounds. Hx quadriplegia and diabetes mellitus. Pt speaks spanish only. NCM spoke with pt and wife @ bedside regarding d/c planning via [employer] interpreter, # [phone] Derek M[place]. From home with wife, Byrd Hesselbach and son Alecia Lemming. Son speaks english. Wife states has NA that's provided through pt's Medicaid to assist with PCS. Pt already has hospital bed , hoyer lift and W/C.  Redge Gainer (Spouse) Brolin Dambrosia (Son320 [phone]     Per MD pt will need [facility] for wound care @ d/c. Referral made with [facility] and accepted.    NCM arranged :  [facility]    [phone] [address] [place] [address]    Next Steps: Follow up on 10/01/2019    Instructions:  1:15 pm with Dr. Baltazar Najjar       Wife states hasn't worked x 1 yr 2/2 caring for pt. Inquired about how she could  be paid while caring for husband. NCM reached out to financial counselor about matter. F.C. stated wife needs to get in touch with pt's Medicaid Case Worker and request a special assist application and complete request. NCM made wife aware via hospital interpreter.   TOC team will continue to monitor for needs....   Patient Goals and CMS Choice        Expected Discharge Plan and Services                                                Prior Living Arrangements/Services                       Activities of Daily Living Home Assistive Devices/Equipment: Wheelchair, Transfer board ADL Screening (condition at time of admission) Patient's cognitive ability adequate to safely  complete daily activities?: Yes Is the patient deaf or have difficulty hearing?: No Does the patient have difficulty seeing, even when wearing glasses/contacts?: No Does the patient have difficulty concentrating, remembering, or making decisions?: No Patient able to express need for assistance with ADLs?: Yes Does the patient have difficulty dressing or bathing?: Yes Independently performs ADLs?: No Does the patient have difficulty walking or climbing stairs?: Yes Weakness of Legs: Both Weakness of Arms/Hands: Both  Permission Sought/Granted                  Emotional Assessment              Admission diagnosis:  Cellulitis of right lower extremity [L03.115] Diabetic foot infection (HCC) [E08.144, L08.9] Cellulitis of lower extremity, unspecified laterality [L03.119] Cellulitis and abscess of upper extremity [L03.119, L02.419] Patient Active Problem List   Diagnosis Date Noted  . Pressure ulcer 09/22/2019  . Cellulitis 09/22/2019  . Hypokalemia 09/22/2019  . Spasticity 03/08/2019  . Erectile dysfunction due to diseases classified elsewhere 03/08/2019  . 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 Neurogenic bowel 01/22/2019  . Neurogenic bladder 01/22/2019  . Myofascial pain  01/22/2019  . 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 Quadriplegia (HCC) 12/30/2018   PCP:  Grayce Sessions, NP Pharmacy:   [facility] [place], [place] - [address] [place] [address] Phone: [phone] Fax: [phone]     Social Determinants of Health (SDOH) Interventions    Readmission Risk Interventions No flowsheet data found.

## 2019-09-24 NOTE — Progress Notes (Signed)
[facility] Progress Note  Patient: Harry CandyCarlos [name]    ZOX:[id number]  DOA: 09/21/2019     Date of Service: the patient was seen and examined on 09/24/2019  Brief hospital course: Past medical history of quadriplegia, type II DM.  Presents with worsening pressure ulcers. Currently plan is continue IV antibiotics.  Assessment and Plan: 1.  Cellulitis bilateral lower extremity Pressure ulcers POA multiple Changing IV clindamycin to IV cefazolin. Check ABI as well as rule out worsening DVT. Continue with Eliquis. Wound care consulted currently recommending orthopedic consult. Appreciate orthopedic consultation as well.  Currently no intervention required.  Monitor. Outpatient follow-up with wound care.  2.  Type 2 diabetes mellitus, controlled. Continue sliding scale insulin. Holding oral hypoglycemic agents.  3.  Quadriplegia with neurogenic bladder. Spinal cord injury. Continue baclofen.  Continue in and out cath.  Monitor.  4.  Hypokalemia Resolved.  Monitor.  5.  Depression Continue home regimen.  6. H/o DVT IVC filter placement h/o Continue Eliquis. Doppler negative for any DVT.  7.  Obesity Body mass index is 35.93 kg/m.   Diet: Cardiac diet DVT Prophylaxis:    apixaban (ELIQUIS) tablet 5 mg    Advance goals of care discussion: Full code  Family Communication: family was present at bedside, at the time of interview.  The pt provided permission to discuss medical plan with the family. Opportunity was given to ask question and all questions were answered satisfactorily.   Disposition:  Status is: Inpatient  Remains inpatient appropriate because:IV treatments appropriate due to intensity of illness or inability to take PO   Dispo: The patient is from: Home              Anticipated d/c is to: Home              Anticipated d/c date is: 1 day              Patient currently is not medically stable to d/c.  Subjective: Feeling better.  No nausea no  vomiting.  No fever no chills.  Physical Exam:  General: Appear in mild distress, no Rash; Oral Mucosa Clear, moist. no Abnormal Neck Mass Or lumps, Conjunctiva normal  Cardiovascular: S1 and S2 Present, no Murmur, Respiratory: good respiratory effort, Bilateral Air entry present and Clear to Auscultation, no Crackles, no wheezes Abdomen: Bowel Sound present, Soft and no tenderness Extremities: bilateral  Pedal edema, no calf tenderness Neurology: alert and oriented to time, place, and person affect flat in affect. no new focal deficit, chronic quadriplegia Gait not checked due to patient safety concerns  Vitals:   09/24/19 0347 09/24/19 0751 09/24/19 1226 09/24/19 1929  BP: 119/90 (!) 105/47 (!) 127/56 108/67  Pulse: 65 68 64 65  Resp: 19 17 17 16   Temp: 98.3 F (36.8 C) 97.7 F (36.5 C) 97.6 F (36.4 C) 98.1 F (36.7 C)  TempSrc: Oral Oral Oral Oral  SpO2: 97% 95% 96% 97%  Weight:      Height:        Intake/Output Summary (Last 24 hours) at 09/24/2019 1959 Last data filed at 09/24/2019 1803 Gross per 24 hour  Intake 840 ml  Output 800 ml  Net 40 ml   Filed Weights   09/23/19 0935  Weight: 113.6 kg    Data Reviewed: I have personally reviewed and interpreted daily labs, tele strips, imagings as discussed above. I reviewed all nursing notes, pharmacy notes, vitals, pertinent old records I have discussed plan of care as described above  with RN and patient/family.  CBC: Recent Labs  Lab 09/21/19 1513 09/23/19 0319  WBC 5.7 4.6  NEUTROABS 3.1  --   HGB 13.5 12.9*  HCT 42.1 40.4  MCV 94.4 92.7  PLT 277 257   Basic Metabolic Panel: Recent Labs  Lab 09/21/19 1513 09/23/19 0319  NA 141 141  K 3.4* 3.6  CL 106 105  CO2 26 27  GLUCOSE 176* 122*  BUN 5* 5*  CREATININE 0.43* 0.43*  CALCIUM 8.9 9.2    Studies: VAS [place] ABI WITH/WO TBI  Result Date: 09/24/2019 LOWER EXTREMITY DOPPLER STUDY Indications: Ulceration. High Risk Factors: Diabetes.  Limitations:  Today's exam was limited due to bandages. Comparison Study: no prior Performing Technologist: Blanch Media RVS  Examination Guidelines: A complete evaluation includes at minimum, Doppler waveform signals and systolic blood pressure reading at the level of bilateral brachial, anterior tibial, and posterior tibial arteries, when vessel segments are accessible. Bilateral testing is considered an integral part of a complete examination. Photoelectric Plethysmograph (PPG) waveforms and toe systolic pressure readings are included as required and additional duplex testing as needed. Limited examinations for reoccurring indications may be performed as noted.  ABI Findings: +--------+------------------+-----+---------+--------+ Right   Rt Pressure (mmHg)IndexWaveform Comment  +--------+------------------+-----+---------+--------+ [id number]                    triphasic         +--------+------------------+-----+---------+--------+ PTA     217               1.46 biphasic          +--------+------------------+-----+---------+--------+ DP      240               1.61 biphasic          +--------+------------------+-----+---------+--------+ +--------+------------------+-----+---------+-----------+ Left    Lt Pressure (mmHg)IndexWaveform Comment     +--------+------------------+-----+---------+-----------+ [id number]                    triphasic            +--------+------------------+-----+---------+-----------+ PTA                                     not audible +--------+------------------+-----+---------+-----------+ DP      202               1.36 triphasic            +--------+------------------+-----+---------+-----------+ +-------+-----------+-----------+------------+------------+ ABI/TBIToday's ABIToday's TBIPrevious ABIPrevious TBI +-------+-----------+-----------+------------+------------+ Right  1.61                                            +-------+-----------+-----------+------------+------------+ Left   1.36       0.70                                +-------+-----------+-----------+------------+------------+  Summary: Right: Resting right ankle-brachial index indicates noncompressible right lower extremity arteries. Cuff unable to fit on toe. Left: Resting left ankle-brachial index indicates noncompressible left lower extremity arteries. The left toe-brachial index is normal.  *See table(s) above for measurements and observations.    Preliminary    VAS [place] LOWER EXTREMITY VENOUS (DVT)  Result Date: 09/24/2019  Lower Venous DVTStudy Indications: Edema.  Limitations: Body habitus, poor  ultrasound/tissue interface and bandages. Performing Technologist: Blanch Media RVS  Examination Guidelines: A complete evaluation includes B-mode imaging, spectral Doppler, color Doppler, and power Doppler as needed of all accessible portions of each vessel. Bilateral testing is considered an integral part of a complete examination. Limited examinations for reoccurring indications may be performed as noted. The reflux portion of the exam is performed with the patient in reverse Trendelenburg.  +---------+---------------+---------+-----------+----------+------------------+ RIGHT    CompressibilityPhasicitySpontaneityPropertiesThrombus Aging     +---------+---------------+---------+-----------+----------+------------------+ CFV      Full           Yes      Yes                                     +---------+---------------+---------+-----------+----------+------------------+ SFJ      Full                                                            +---------+---------------+---------+-----------+----------+------------------+ FV Prox  Full                                                            +---------+---------------+---------+-----------+----------+------------------+ FV Mid                  Yes      Yes                                      +---------+---------------+---------+-----------+----------+------------------+ FV Distal               Yes      Yes                                     +---------+---------------+---------+-----------+----------+------------------+ PFV      Full                                                            +---------+---------------+---------+-----------+----------+------------------+ POP      Full           Yes      Yes                                     +---------+---------------+---------+-----------+----------+------------------+ PTV                                                   limited  visualization      +---------+---------------+---------+-----------+----------+------------------+ PERO                                                  limited                                                                  visualization      +---------+---------------+---------+-----------+----------+------------------+   +---------+---------------+---------+-----------+----------+------------------+ LEFT     CompressibilityPhasicitySpontaneityPropertiesThrombus Aging     +---------+---------------+---------+-----------+----------+------------------+ CFV      Full           Yes      Yes                                     +---------+---------------+---------+-----------+----------+------------------+ SFJ      Full                                                            +---------+---------------+---------+-----------+----------+------------------+ FV Prox  Full                                                            +---------+---------------+---------+-----------+----------+------------------+ FV Mid   Full                                                            +---------+---------------+---------+-----------+----------+------------------+ FV  Distal               Yes      Yes                                     +---------+---------------+---------+-----------+----------+------------------+ PFV      Full                                                            +---------+---------------+---------+-----------+----------+------------------+ POP      Full           Yes      Yes                                     +---------+---------------+---------+-----------+----------+------------------+ PTV  limited                                                                  visualization      +---------+---------------+---------+-----------+----------+------------------+ PERO                                                  limited                                                                  visualization      +---------+---------------+---------+-----------+----------+------------------+     Summary: RIGHT: - There is no evidence of deep vein thrombosis in the lower extremity. However, portions of this examination were limited- see technologist comments above.  - No cystic structure found in the popliteal fossa.  LEFT: - There is no evidence of deep vein thrombosis in the lower extremity. However, portions of this examination were limited- see technologist comments above.  - No cystic structure found in the popliteal fossa.  *See table(s) above for measurements and observations.    Preliminary     Scheduled Meds: . apixaban  5 mg Oral BID  . baclofen  5 mg Oral TID AC  . bisacodyl  10 mg Rectal QHS  . FLUoxetine  10 mg Oral Daily  . furosemide  40 mg Oral Daily  . insulin aspart  0-5 Units Subcutaneous QHS  . insulin aspart  0-9 Units Subcutaneous TID WC  . senna-docusate  1 tablet Oral BID  . simethicone  80 mg Oral TID PC  . sodium chloride flush  3 mL Intravenous Q12H   Continuous Infusions: .  ceFAZolin (ANCEF) IV 2 g (09/24/19 1523)   PRN  Meds: acetaminophen, albuterol, hydrOXYzine, polyvinyl alcohol  Time spent: 35 minutes  Author: Lynden Oxford, MD [facility] 09/24/2019 7:59 PM  To reach On-call, see care teams to locate the attending and reach out via [email or web address]. Between 7PM-7AM, please contact night-coverage If you still have difficulty reaching the attending provider, please page the [facility] (Director on Call) for [facility] on amion for assistance.

## 2019-09-24 NOTE — Evaluation (Signed)
Physical Therapy Evaluation Patient Details Name: [name] MRN: [id number] DOB: [birth year] Today's Date: 09/24/2019   History of Present Illness  Pt is 54 yo male with hx of DM II and incomplete quadriplegia (per prior outpt PT note: C1 burst fx with [place] transection s/p C4-T6 spinal fusion (07/22/18), incomplete quadriplegia).  Pt now admitted with cellulitis bil LE and pressure ulcers.  Clinical Impression  Pt admitted with above diagnosis. Pt with hx of incomplete quadriplegia.  Performed ROM and bed mobility today due to unable to progress further in hospital bed with assist of 1 safely.  Pt uses hoyer lift with family at home and was working on Hydrologist transfers with outpt therapy in March of this year.  Reviewed outpt PT note and noted that plan was to resume outpt PT later this year.  However, pt needing HH wound care at this time.  May benefit from HHPT and progressing back to outpt PT as able.  Pt currently with functional limitations due to the deficits listed below (see PT Problem List). Pt will benefit from skilled PT to increase their independence and safety with mobility to allow discharge to the venue listed below.       Follow Up Recommendations Home health PT;Supervision/Assistance - 24 hour     Equipment Recommendations  None recommended by PT    Recommendations for Other Services       Precautions / Restrictions Precautions Precautions: Fall Precaution Comments: incomplete quadriplegia      Mobility  Bed Mobility   Bed Mobility: Rolling Rolling: Max assist         General bed mobility comments: Unable to further test safely today with assist of 1  Transfers                    Ambulation/Gait                Stairs            Wheelchair Mobility    Modified Rankin (Stroke Patients Only)       Balance Overall balance assessment: Needs assistance     Sitting balance - Comments: unable to test today with assist of 1                                      Pertinent Vitals/Pain Pain Assessment: No/denies pain    Home Living Family/patient expects to be discharged to:: Private residence Living Arrangements: Spouse/significant other Available Help at Discharge: Family;Available 24 hours/day Type of Home: House Home Access: Level entry     Home Layout: Able to live on main level with bedroom/bathroom Home Equipment: Wheelchair - power;Hospital bed;Other (comment) Additional Comments: hoyer lift    Prior Function Level of Independence: Needs assistance      ADL's / Homemaking Assistance Needed: dependent  Comments: Pt is non-ambulatory and bed/w/c bound.  At home they use a hoyer lift for transfers.  They report that they wanted to start outpt PT again.  Reviewed last PT note from 04/2019 and it seems planned to restart PT after pt's trip to [place] this summer.  Pt had progressed to level of mod A for supine/sit and lateral scoots mat to w/c with sliding board.     Hand Dominance        Extremity/Trunk Assessment   Upper Extremity Assessment Upper Extremity Assessment: LUE deficits/detail;RUE deficits/detail RUE Deficits / Details: ROM [facility]  and MMT 5/5 RUE Sensation: WNL LUE Deficits / Details: ROM WFL and MMT 5/5 except L grip 4/5 LUE Sensation: WNL    Lower Extremity Assessment Lower Extremity Assessment: LLE deficits/detail;RLE deficits/detail RLE Deficits / Details: PROM WFL; MMT 0/5 RLE Sensation:  (Pt with no sensation from chest down) LLE Deficits / Details: PROM WFL ; MMT 0/5 LLE Sensation:  (Pt with no sensation from chest down)    Cervical / Trunk Assessment Cervical / Trunk Assessment: Other exceptions Cervical / Trunk Exceptions: in bed difficult to assess  Communication   Communication: Prefers language other than [place];Other (comment) (utilized [employer] Intrepretor ID [phone])  Cognition Arousal/Alertness: Awake/alert Behavior During Therapy: WFL for tasks  assessed/performed Overall Cognitive Status: Within Functional Limits for tasks assessed                                        General Comments      Exercises General Exercises - Lower Extremity Ankle Circles/Pumps: PROM;Both;5 reps Heel Slides: PROM;Both;5 reps Hip ABduction/ADduction: PROM;Both;5 reps   Assessment/Plan    PT Assessment Patient needs continued PT services  PT Problem List Decreased strength;Decreased mobility;Decreased range of motion;Decreased activity tolerance;Decreased balance;Decreased knowledge of use of DME       PT Treatment Interventions DME instruction;Therapeutic activities;Therapeutic exercise;Patient/family education;Balance training;Functional mobility training    PT Goals (Current goals can be found in the Care Plan section)  Acute Rehab PT Goals Patient Stated Goal: return home; resume outpt PT/OT PT Goal Formulation: With patient/family Time For Goal Achievement: 10/08/19 Potential to Achieve Goals: Fair    Frequency Min 2X/week   Barriers to discharge        Co-evaluation               AM-PAC PT "6 Clicks" Mobility  Outcome Measure Help needed turning from your back to your side while in a flat bed without using bedrails?: Total Help needed moving from lying on your back to sitting on the side of a flat bed without using bedrails?: Total Help needed moving to and from a bed to a chair (including a wheelchair)?: Total Help needed standing up from a chair using your arms (e.g., wheelchair or bedside chair)?: Total Help needed to walk in hospital room?: Total Help needed climbing 3-5 steps with a railing? : Total 6 Click Score: 6    End of Session   Activity Tolerance: Other (comment) (tolerated bed mobility/exercise well; unable to progress further with assist of 1) Patient left: in bed;with call bell/phone within reach;with family/visitor present Nurse Communication: Mobility status;Need for lift  equipment PT Visit Diagnosis: Muscle weakness (generalized) (M62.81);Other symptoms and signs involving the nervous system (R29.898)    Time: 1749-4496 PT Time Calculation (min) (ACUTE ONLY): 25 min   Charges:   PT Evaluation $PT Eval Low Complexity: 1 Low           Ramatoulaye Pack, PT Acute Rehab Services Pager [phone] [facility] [phone]    Rayetta Humphrey 09/24/2019, 4:56 PM

## 2019-09-25 LAB — CBC WITH DIFFERENTIAL/PLATELET
Abs Immature Granulocytes: 0.01 10*3/uL (ref 0.00–0.07)
Basophils Absolute: 0 10*3/uL (ref 0.0–0.1)
Basophils Relative: 1 %
Eosinophils Absolute: 0.4 10*3/uL (ref 0.0–0.5)
Eosinophils Relative: 7 %
HCT: 41.4 % (ref 39.0–52.0)
Hemoglobin: 13.4 g/dL (ref 13.0–17.0)
Immature Granulocytes: 0 %
Lymphocytes Relative: 38 %
Lymphs Abs: 2 10*3/uL (ref 0.7–4.0)
MCH: 30 pg (ref 26.0–34.0)
MCHC: 32.4 g/dL (ref 30.0–36.0)
MCV: 92.8 fL (ref 80.0–100.0)
Monocytes Absolute: 0.4 10*3/uL (ref 0.1–1.0)
Monocytes Relative: 8 %
Neutro Abs: 2.4 10*3/uL (ref 1.7–7.7)
Neutrophils Relative %: 46 %
Platelets: 259 10*3/uL (ref 150–400)
RBC: 4.46 MIL/uL (ref 4.22–5.81)
RDW: 12.7 % (ref 11.5–15.5)
WBC: 5.3 10*3/uL (ref 4.0–10.5)
nRBC: 0 % (ref 0.0–0.2)

## 2019-09-25 LAB — COMPREHENSIVE METABOLIC PANEL
ALT: 22 U/L (ref 0–44)
AST: 28 U/L (ref 15–41)
Albumin: 3.3 g/dL — ABNORMAL LOW (ref 3.5–5.0)
Alkaline Phosphatase: 52 U/L (ref 38–126)
Anion gap: 9 (ref 5–15)
BUN: 5 mg/dL — ABNORMAL LOW (ref 6–20)
CO2: 27 mmol/L (ref 22–32)
Calcium: 9.2 mg/dL (ref 8.9–10.3)
Chloride: 103 mmol/L (ref 98–111)
Creatinine, Ser: 0.61 mg/dL (ref 0.61–1.24)
GFR calc Af Amer: >60 mL/min (ref >60)
GFR calc non Af Amer: >60 mL/min (ref >60)
Glucose, Bld: 121 mg/dL — ABNORMAL HIGH (ref 70–99)
Potassium: 3.7 mmol/L (ref 3.5–5.1)
Sodium: 139 mmol/L (ref 135–145)
Total Bilirubin: 0.6 mg/dL (ref 0.3–1.2)
Total Protein: 6.8 g/dL (ref 6.5–8.1)

## 2019-09-25 LAB — GLUCOSE, CAPILLARY: Glucose-Capillary: 118 mg/dL — ABNORMAL HIGH (ref 70–99)

## 2019-09-25 LAB — MAGNESIUM: Magnesium: 2 mg/dL (ref 1.7–2.4)

## 2019-09-25 MED ORDER — FUROSEMIDE 40 MG PO TABS: ORAL_TABLET | ORAL | 0 refills | Status: AC

## 2019-09-25 MED ORDER — HYDROXYZINE HCL 25 MG PO TABS: 25.0000 mg | ORAL_TABLET | Freq: Three times a day (TID) | ORAL | 0 refills | Status: DC | PRN

## 2019-09-25 MED ORDER — DOCUSATE SODIUM 100 MG PO CAPS
100.0000 mg | ORAL_CAPSULE | Freq: Every day | ORAL | 0 refills | Status: AC | PRN
Start: 2019-09-25 — End: 2020-09-24

## 2019-09-25 MED ORDER — POVIDONE-IODINE 10 % EX SWAB
1.0000 | CUTANEOUS | 0 refills | Status: DC | PRN
Start: 2019-09-25 — End: 2020-04-26

## 2019-09-25 MED ORDER — FUROSEMIDE 40 MG PO TABS: 40.0000 mg | ORAL_TABLET | Freq: Every day | ORAL | 0 refills | Status: DC

## 2019-09-25 MED ORDER — POVIDONE-IODINE 10 % EX SWAB
1.0000 | CUTANEOUS | 0 refills | Status: DC | PRN
Start: 2019-09-25 — End: 2019-09-25

## 2019-09-25 MED ORDER — CEPHALEXIN 500 MG PO CAPS: 500.0000 mg | ORAL_CAPSULE | Freq: Three times a day (TID) | ORAL | 0 refills | Status: AC

## 2019-09-25 NOTE — Progress Notes (Signed)
Patient discharging home. Discharge instructions explained to patient and patient's wife and she verbalized understanding. Wound care teaching with return demonstration. Extra wound care supplies given to patient. Took all personal belongings. No further questions or concerns voiced.

## 2019-09-28 ENCOUNTER — Telehealth: Payer: Self-pay

## 2019-09-28 NOTE — Telephone Encounter (Signed)
Transition Care Management Follow-up Telephone Call Call completed with patient's wife , [name].  DPR on file to speak with her. Spanish Interpreter # [phone] with [employer] assisted   Date of discharge and from where: 09/25/2019, [facility]   How have you been since you were released from the hospital? She stated that he is doing good.  Any questions or concerns?   None at this time.   Items Reviewed:  Did the pt receive and understand the discharge instructions provided?  yes, she has the instructions and did not have any questions.   Medications obtained and verified?  she has the new medications but had difficulty picking them up. Her sister picked then up. She said that it was difficult to get to the pharmacy.   This CM offered her options of pharmacies that delivered and she said she wanted to continue with her current pharmacy.  She said that the hospital staff provided a detailed explanation of the medications prior to his discharge and she did not have any questions about the med regime.   Any new allergies since your discharge?  none reported  Do you have support at home?  his wife  Has wheelchair, hospital bed, hoyer lift, sliding board  Referral made to [facility]. She has not heard from the agency yet.  Instructed her to call the agency if she has not heard from them in the next day or 2.   She has been changing the dressings on his legs.  She said that the nurse showed her what to do prior to discharge. She was given dressings for a couple of days but will need to purchase additional supplies.   Receives [facility]   - 4 hours x 2 days/week,   Functional Questionnaire: (I = Independent and D = Dependent) ADLs:wife provides all needed care, including in and out catheterizations.   Follow up appointments reviewed:   PCP Hospital f/u appt confirmed?she did not want to schedule an appointment at this time.  She said she needs to speak to her husband  first  Specialist Hospital f/u appt confirmed? wound care 10/01/2019  Are transportation arrangements needed? no, his wife takes him to appts.   If their condition worsens, is the pt aware to call PCP or go to the Emergency Dept.? yes  Was the patient provided with contact information for the PCP's office or ED?  provided her with the phone number for [facility]  Was to pt encouraged to call back with questions or concerns?   yes

## 2019-09-29 NOTE — Discharge Summary (Signed)
[facility] Discharge Summary   Patient: [name] SFS:[id number]  PCP: Kerin Perna, NP  Date of admission: 09/21/2019   Date of discharge: 09/25/2019     Discharge Diagnoses:   Principal Problem:   Cellulitis Active Problems:   Spinal cord injury at T1-T6 level ([place])   Quadriplegia ([place])   Presence of IVC filter   Neurogenic bladder   Pressure ulcer   Hypokalemia   Admitted From: home Disposition:  Home   Recommendations for Outpatient Follow-up:  1. PCP: follow up in 1 week 2. Follow up LABS/TEST:  none   Follow-up Information    [place]              Follow up on 10/01/2019.   Why:  1:15 pm with Dr. Linton Ham Contact information: [address]. [place] [id number] [place] Follow up.   Contact information: [place] [id number] [place], [facility] Follow up.   Specialty: Home Health Services Why: Home health services arranged Contact information: [address] [place] [address] [phone]              Diet recommendation: Cardiac diet  Activity: The patient is advised to gradually reintroduce usual activities, as tolerated  Discharge Condition: stable  Code Status: Full code   History of present illness: As per the H and P dictated on admission, "[name] is a 54 y.o. male with medical history significant of quadriplegia and diabetes mellitus type 2 presents for worsening ulcers bilateral feet.  History is obtained with use of Spanish interpretive services.  Symptoms have been present for at least 1 month now.  Initially thought to have been provoked after getting new shoes.  He had been seen in a clinic 3-4 weeks ago and had been given some oral antibiotics at that time which he completed.  However, since plain antibiotics he has  noted that the ulcerations have been getting worse.  The ulcerations have been draining clearish yellow fluid with some blood present.  Notes associated symptoms of chills, neck pain, and nausea.   his blood sugars have been good and his last hemoglobin A1c was less than 7 when checked just last month.  He was evaluated by his primary care provider on 7/26 and prescribed collagenase topical ointment and was referred to the wound care center.  Denies having any pain/feeling in his feet, shortness of breath, cough, vomiting, or diarrhea.  ED Course: Upon admission to the emergency department progressing to be afebrile, pulse 58-71, and all other vital signs stable.  Labs significant for potassium 3.4, glucose 176, and lactic acid 1.4.  X-ray imaging of the lower extremities showed soft tissue swelling without signs of bone involvement.  Patient was started on empiric antibiotics clindamycin."  Hospital Course:   Summary of his active problems in the hospital is as following. 1.  Cellulitis bilateral lower extremity Pressure ulcers POA multiple Changing IV clindamycin to IV cefazolin. Now on oral Antibiotics  Check ABI as well as rule out worsening DVT. Continue with Eliquis. Wound care consulted currently recommending orthopedic consult. Appreciate orthopedic consultation as well.  Currently no intervention required.  Monitor. Outpatient follow-up with wound care.  2.  Type 2 diabetes mellitus, controlled. Continue sliding scale insulin. Holding oral hypoglycemic agents.  3.  Quadriplegia with  neurogenic bladder. Spinal cord injury. Continue baclofen.  Continue in and out cath.  Monitor.  4.  Hypokalemia Resolved.  Monitor.  5.  Depression Continue home regimen.  6. H/o DVT IVC filter placement h/o Continue Eliquis. Doppler negative for any DVT.  7.  Obesity Body mass index is 35.93 kg/m.   Patient was ambulatory without any assistance. On the day of the discharge the  patient's vitals were stable, and no other acute medical condition were reported by patient. the patient was felt safe to be discharge at Home with Home health.  Consultants: Orthopedics  Procedures: none  Discharge Exam: General: Appear in mild distress, no Rash; Oral Mucosa Clear, moist. Cardiovascular: S1 and S2 Present, no Murmur, Respiratory: normal respiratory effort, Bilateral Air entry present and no Crackles, no wheezes Abdomen: Bowel Sound present, Soft and no tenderness, no hernia Extremities: no Pedal edema, no calf tenderness Neurology: alert and oriented to time, place, and person affect appropriate.  Filed Weights   09/23/19 0935  Weight: 113.6 kg   Vitals:   09/25/19 0300 09/25/19 0758  BP: 111/61 119/71  Pulse: 68 71  Resp: 17 16  Temp: 98.5 F (36.9 C) 98.1 F (36.7 C)  SpO2: 95% 96%    DISCHARGE MEDICATION: Allergies as of 09/25/2019   No Known Allergies     Medication List    STOP taking these medications   gabapentin 600 MG tablet Commonly known as: Neurontin   oxyCODONE 5 MG immediate release tablet Commonly known as: Oxy IR/ROXICODONE   Santyl ointment Generic drug: collagenase   Senokot 8.6 MG tablet Generic drug: senna     TAKE these medications   acetaminophen 500 MG tablet Commonly known as: TYLENOL Take 500 mg by mouth every 6 (six) hours as needed.   ARTIFICIAL TEARS OP Apply 1-2 drops to eye daily as needed (dry eyes).   Baclofen 5 MG Tabs Take 1 tablet by mouth in the morning, at noon, and at bedtime. What changed: Another medication with the same name was removed. Continue taking this medication, and follow the directions you see here.   BISACODYL LAXATIVE RE Place 1 suppository rectally at bedtime.   blood glucose meter kit and supplies Dispense based on patient and insurance preference. Use up to four times daily as directed. (FOR ICD-10 E10.9, E11.9).   cephALEXin 500 MG capsule Commonly known as: KEFLEX Take 1  capsule (500 mg total) by mouth 3 (three) times daily for 4 days.   docusate sodium 100 MG capsule Commonly known as: Colace Take 1 capsule (100 mg total) by mouth daily as needed.   Eliquis 5 MG Tabs tablet Generic drug: apixaban TAKE 1 TABLET BY MOUTH 2 TIMES DAILY What changed: how much to take   FLUoxetine 20 MG capsule Commonly known as: PROZAC Take 1 capsule (20 mg total) by mouth daily.   furosemide 40 MG tablet Commonly known as: LASIX Take 1 tablet daily for 6 days, take as needed after that for swelling or weight gain   hydrOXYzine 25 MG tablet Commonly known as: ATARAX/VISTARIL Take 1 tablet (25 mg total) by mouth 3 (three) times daily as needed for itching.   Janumet 50-1000 MG tablet Generic drug: sitaGLIPtin-metformin Take 1 tablet by mouth 2 (two) times daily with a meal.   povidone-iodine 10 % swab Apply 1 application topically as needed.   sildenafil 100 MG tablet Commonly known as: VIAGRA TAKE 1 TABLET BY MOUTH EVERY DAY AS NEEDED FOR ERECTILE DYSFUNCTION What changed: See  the new instructions.   simethicone 80 MG chewable tablet Commonly known as: MYLICON Chew 1 tablet (80 mg total) by mouth every 6 (six) hours as needed. What changed: when to take this            Discharge Care Instructions  (From admission, onward)         Start     Ordered   09/25/19 0000  Discharge wound care:       Comments: Daily and PRN: Apply iodine (from swab sticks or swab pads) to all foot and leg wounds. Allow to air dry. Cover with kerlex.   09/25/19 0836         No Known Allergies Discharge Instructions    Diet - low sodium heart healthy   Complete by: As directed    Discharge wound care:   Complete by: As directed    Daily and PRN: Apply iodine (from swab sticks or swab pads) to all foot and leg wounds. Allow to air dry. Cover with kerlex.   Increase activity slowly   Complete by: As directed       The results of significant diagnostics from this  hospitalization (including imaging, microbiology, ancillary and laboratory) are listed below for reference.    Significant Diagnostic Studies: DG Tibia/Fibula Left  Result Date: 09/22/2019 CLINICAL DATA:  Ulcerations.  Wheelchair bound. EXAM: LEFT TIBIA AND FIBULA - 2 VIEW COMPARISON:  None. FINDINGS: No acute osseous findings in the tibia fibula. No erosion. Disuse osteopenia within the calcaneus and midfoot. IMPRESSION: 1. No acute findings in the tibia fibula. 2. Disuse osteopenia within the calcaneus and midfoot Electronically Signed   By: Suzy Bouchard M.D.   On: 09/22/2019 12:05   DG Foot Complete Left  Result Date: 09/22/2019 CLINICAL DATA:  Foot ulcers EXAM: LEFT FOOT - COMPLETE 3+ VIEW COMPARISON:  None. FINDINGS: Osteopenia. No fracture or dislocation of the left foot. Joint spaces are preserved. Diffuse soft tissue edema about the foot and ankle. IMPRESSION: 1.  Osteopenia. No fracture or dislocation of the left foot. 2.  No radiographic evidence of osteomyelitis. 3. Diffuse soft tissue edema about the foot and ankle. Electronically Signed   By: Eddie Candle M.D.   On: 09/22/2019 12:00   DG Foot Complete Right  Result Date: 09/22/2019 CLINICAL DATA:  Foot ulcerations. Wounds over bilateral feet and posterior distal left tib fib. EXAM: RIGHT FOOT COMPLETE - 3+ VIEW COMPARISON:  None. FINDINGS: Marked diffuse soft tissue swelling is identified. The bones appear diffusely osteopenic. No acute fracture or dislocation identified. No focal bone erosions. IMPRESSION: 1. Marked diffuse soft tissue swelling concerning for diffuse cellulitis 2. No focal bone erosions identified to suggest osteomyelitis. Electronically Signed   By: Kerby Moors M.D.   On: 09/22/2019 11:58   VAS [place] ABI WITH/WO TBI  Result Date: 09/25/2019 LOWER EXTREMITY DOPPLER STUDY Indications: Ulceration. High Risk Factors: Diabetes.  Limitations: Today's exam was limited due to bandages. Comparison Study: no prior  Performing Technologist: Abram Sander RVS  Examination Guidelines: A complete evaluation includes at minimum, Doppler waveform signals and systolic blood pressure reading at the level of bilateral brachial, anterior tibial, and posterior tibial arteries, when vessel segments are accessible. Bilateral testing is considered an integral part of a complete examination. Photoelectric Plethysmograph (PPG) waveforms and toe systolic pressure readings are included as required and additional duplex testing as needed. Limited examinations for reoccurring indications may be performed as noted.  ABI Findings: +--------+------------------+-----+---------+--------+ Right   Rt Pressure (mmHg)IndexWaveform  Comment  +--------+------------------+-----+---------+--------+ [id number]                    triphasic         +--------+------------------+-----+---------+--------+ PTA     217               1.46 biphasic          +--------+------------------+-----+---------+--------+ DP      240               1.61 biphasic          +--------+------------------+-----+---------+--------+ +--------+------------------+-----+---------+---------------------+ Left    Lt Pressure (mmHg)IndexWaveform Comment               +--------+------------------+-----+---------+---------------------+ [id number]                    triphasic                      +--------+------------------+-----+---------+---------------------+ PTA                                     not audible/ bandages +--------+------------------+-----+---------+---------------------+ DP      202               1.36 triphasic                      +--------+------------------+-----+---------+---------------------+ +-------+-----------+-----------+------------+------------+ ABI/TBIToday's ABIToday's TBIPrevious ABIPrevious TBI +-------+-----------+-----------+------------+------------+ Right  1.61                                            +-------+-----------+-----------+------------+------------+ Left   1.36       0.70                                +-------+-----------+-----------+------------+------------+  Summary: Right: Resting right ankle-brachial index indicates noncompressible right lower extremity arteries. Cuff unable to fit on toe. Left: Resting left ankle-brachial index indicates noncompressible left lower extremity arteries. The left toe-brachial index is normal.  *See table(s) above for measurements and observations.  Electronically signed by Ruta Hinds MD on 09/25/2019 at 12:14:10 PM.   Final    VAS [place] LOWER EXTREMITY VENOUS (DVT)  Result Date: 09/25/2019  Lower Venous DVTStudy Indications: Edema.  Limitations: Body habitus, poor ultrasound/tissue interface and bandages. Performing Technologist: Abram Sander RVS  Examination Guidelines: A complete evaluation includes B-mode imaging, spectral Doppler, color Doppler, and power Doppler as needed of all accessible portions of each vessel. Bilateral testing is considered an integral part of a complete examination. Limited examinations for reoccurring indications may be performed as noted. The reflux portion of the exam is performed with the patient in reverse Trendelenburg.  +---------+---------------+---------+-----------+----------+------------------+ RIGHT    CompressibilityPhasicitySpontaneityPropertiesThrombus Aging     +---------+---------------+---------+-----------+----------+------------------+ CFV      Full           Yes      Yes                                     +---------+---------------+---------+-----------+----------+------------------+ SFJ      Full                                                            +---------+---------------+---------+-----------+----------+------------------+  FV Prox  Full                                                             +---------+---------------+---------+-----------+----------+------------------+ FV Mid                  Yes      Yes                                     +---------+---------------+---------+-----------+----------+------------------+ FV Distal               Yes      Yes                                     +---------+---------------+---------+-----------+----------+------------------+ PFV      Full                                                            +---------+---------------+---------+-----------+----------+------------------+ POP      Full           Yes      Yes                                     +---------+---------------+---------+-----------+----------+------------------+ PTV                                                   limited                                                                  visualization      +---------+---------------+---------+-----------+----------+------------------+ PERO                                                  limited                                                                  visualization      +---------+---------------+---------+-----------+----------+------------------+   +---------+---------------+---------+-----------+----------+------------------+ LEFT     CompressibilityPhasicitySpontaneityPropertiesThrombus Aging     +---------+---------------+---------+-----------+----------+------------------+ CFV      Full           Yes  Yes                                     +---------+---------------+---------+-----------+----------+------------------+ SFJ      Full                                                            +---------+---------------+---------+-----------+----------+------------------+ FV Prox  Full                                                            +---------+---------------+---------+-----------+----------+------------------+ FV Mid   Full                                                             +---------+---------------+---------+-----------+----------+------------------+ FV Distal               Yes      Yes                                     +---------+---------------+---------+-----------+----------+------------------+ PFV      Full                                                            +---------+---------------+---------+-----------+----------+------------------+ POP      Full           Yes      Yes                                     +---------+---------------+---------+-----------+----------+------------------+ PTV                                                   limited                                                                  visualization      +---------+---------------+---------+-----------+----------+------------------+ PERO                                                  limited  visualization      +---------+---------------+---------+-----------+----------+------------------+     Summary: RIGHT: - There is no evidence of deep vein thrombosis in the lower extremity. However, portions of this examination were limited- see technologist comments above.  - No cystic structure found in the popliteal fossa.  LEFT: - There is no evidence of deep vein thrombosis in the lower extremity. However, portions of this examination were limited- see technologist comments above.  - No cystic structure found in the popliteal fossa.  *See table(s) above for measurements and observations. Electronically signed by Ruta Hinds MD on 09/25/2019 at 12:13:47 PM.    Final     Microbiology: Recent Results (from the past 240 hour(s))  SARS Coronavirus 2 by RT PCR (hospital order, performed in [facility] hospital lab) Nasopharyngeal Nasopharyngeal Swab     Status: None   Collection Time: 09/22/19  2:30 PM   Specimen: Nasopharyngeal Swab    Result Value Ref Range Status   SARS Coronavirus 2 NEGATIVE NEGATIVE Final    Comment: (NOTE) SARS-CoV-2 target nucleic acids are NOT DETECTED.  The SARS-CoV-2 RNA is generally detectable in upper and lower respiratory specimens during the acute phase of infection. The lowest concentration of SARS-CoV-2 viral copies this assay can detect is 250 copies / mL. A negative result does not preclude SARS-CoV-2 infection and should not be used as the sole basis for treatment or other patient management decisions.  A negative result may occur with improper specimen collection / handling, submission of specimen other than nasopharyngeal swab, presence of viral mutation(s) within the areas targeted by this assay, and inadequate number of viral copies (<250 copies / mL). A negative result must be combined with clinical observations, patient history, and epidemiological information.  Fact Sheet for Patients:   StrictlyIdeas.no  Fact Sheet for Healthcare Providers: BankingDealers.co.za  This test is not yet approved or  cleared by the [place] FDA and has been authorized for detection and/or diagnosis of SARS-CoV-2 by FDA under an Emergency Use Authorization (EUA).  This EUA will remain in effect (meaning this test can be used) for the duration of the COVID-19 declaration under Section 564(b)(1) of the Act, 21 U.S.C. section 360bbb-3(b)(1), unless the authorization is terminated or revoked sooner.  Performed at [facility], [place] [address], [place], [place] [address]      Labs: CBC: Recent Labs  Lab 09/23/19 0319 09/25/19 0434  WBC 4.6 5.3  NEUTROABS  --  2.4  HGB 12.9* 13.4  HCT 40.4 41.4  MCV 92.7 92.8  PLT 257 641   Basic Metabolic Panel: Recent Labs  Lab 09/23/19 0319 09/25/19 0434  NA 141 139  K 3.6 3.7  CL 105 103  CO2 27 27  GLUCOSE 122* 121*  BUN 5* 5*  CREATININE 0.43* 0.61  CALCIUM 9.2 9.2  MG  --   2.0   Liver Function Tests: Recent Labs  Lab 09/25/19 0434  AST 28  ALT 22  ALKPHOS 52  BILITOT 0.6  PROT 6.8  ALBUMIN 3.3*   No results for input(s): LIPASE, AMYLASE in the last 168 hours. No results for input(s): AMMONIA in the last 168 hours. Cardiac Enzymes: No results for input(s): CKTOTAL, CKMB, CKMBINDEX, TROPONINI in the last 168 hours. BNP (last 3 results) No results for input(s): BNP in the last 8760 hours. CBG: Recent Labs  Lab 09/24/19 0635 09/24/19 1224 09/24/19 1638 09/24/19 2016 09/25/19 0634  GLUCAP 115* 114* 131* 133* 118*    Time spent: 35 minutes  Signed:  Berle Mull  [facility] 09/25/2019

## 2019-10-01 ENCOUNTER — Encounter (HOSPITAL_BASED_OUTPATIENT_CLINIC_OR_DEPARTMENT_OTHER): Payer: Medicaid Other | Attending: Internal Medicine | Admitting: Internal Medicine

## 2019-10-01 DIAGNOSIS — L89623 Pressure ulcer of left heel, stage 3: Secondary | ICD-10-CM | POA: Insufficient documentation

## 2019-10-01 DIAGNOSIS — G8221 Paraplegia, complete: Secondary | ICD-10-CM | POA: Diagnosis not present

## 2019-10-01 DIAGNOSIS — E11622 Type 2 diabetes mellitus with other skin ulcer: Secondary | ICD-10-CM | POA: Diagnosis not present

## 2019-10-01 DIAGNOSIS — L89899 Pressure ulcer of other site, unspecified stage: Secondary | ICD-10-CM | POA: Insufficient documentation

## 2019-10-01 DIAGNOSIS — L97528 Non-pressure chronic ulcer of other part of left foot with other specified severity: Secondary | ICD-10-CM | POA: Diagnosis not present

## 2019-10-01 DIAGNOSIS — L89613 Pressure ulcer of right heel, stage 3: Secondary | ICD-10-CM | POA: Insufficient documentation

## 2019-10-01 DIAGNOSIS — E11621 Type 2 diabetes mellitus with foot ulcer: Secondary | ICD-10-CM | POA: Insufficient documentation

## 2019-10-01 DIAGNOSIS — Z7901 Long term (current) use of anticoagulants: Secondary | ICD-10-CM | POA: Diagnosis not present

## 2019-10-01 DIAGNOSIS — L97518 Non-pressure chronic ulcer of other part of right foot with other specified severity: Secondary | ICD-10-CM | POA: Diagnosis not present

## 2019-10-07 ENCOUNTER — Encounter (HOSPITAL_BASED_OUTPATIENT_CLINIC_OR_DEPARTMENT_OTHER): Payer: Medicaid Other | Admitting: Internal Medicine

## 2019-10-08 ENCOUNTER — Other Ambulatory Visit: Payer: Self-pay

## 2019-10-08 ENCOUNTER — Encounter (HOSPITAL_BASED_OUTPATIENT_CLINIC_OR_DEPARTMENT_OTHER): Payer: Medicaid Other | Admitting: Internal Medicine

## 2019-10-08 DIAGNOSIS — E11621 Type 2 diabetes mellitus with foot ulcer: Secondary | ICD-10-CM | POA: Diagnosis not present

## 2019-10-09 NOTE — Progress Notes (Signed)
[name], [name] ([id number]) Visit Report for 10/01/2019 Allergy List Details Patient Name: Date of Service: [name] 10/01/2019 1:15 PM Medical Record Number: [id number] Patient Account Number: [id number] Date of Birth/Sex: Treating RN: 09/06/1965 (53 y.o. [name]) Yevonne Pax Primary Care Kewanda Poland: [name] Other Clinician: Referring Tagg Eustice: Treating Cleatus Goodin/Extender: [name] in Treatment: 0 Allergies Active Allergies No Known Allergies Allergy Notes Electronic Signature([name]: 10/08/2019 5:50:16 PM By: Yevonne Pax RN Entered By: Yevonne Pax on 10/01/2019 13:47:35 -------------------------------------------------------------------------------- Arrival Information Details Patient Name: Date of Service: [name], [name] 10/01/2019 1:15 PM Medical Record Number: [id number] Patient Account Number: [id number] Date of Birth/Sex: Treating RN: [birth year] (53 y.o. [name] Primary Care Elmor Kost: [name] Other Clinician: Referring Adler Alton: Treating Terreon Ekholm/Extender: [name] in Treatment: 0 Visit Information Patient Arrived: Wheel Chair Arrival Time: 13:44 Accompanied By: wife Transfer Assistance: None Patient Identification Verified: Yes Secondary Verification Process Completed: Yes Patient Requires Transmission-Based Precautions: No Patient Has Alerts: Yes Patient Alerts: Translator Required right ABI 1.61 Left ABI 1.36 Electronic Signature([name]: 10/08/2019 5:50:16 PM By: Yevonne Pax RN Entered By: Yevonne Pax on 10/01/2019 14:36:31 -------------------------------------------------------------------------------- Clinic Level of Care Assessment Details Patient Name: Date of Service: [name] 10/01/2019 1:15 PM Medical Record Number: [id number] Patient Account Number: [id number] Date of Birth/Sex: Treating RN: Jan 02, 1966 (54 y.o. [name] Primary  Care Oather Muilenburg: [name] Other Clinician: Referring Dock Baccam: Treating Sharilynn Cassity/Extender: [name] in Treatment: 0 Clinic Level of Care Assessment Items TOOL 1 Quantity Score X- 1 0 Use when EandM and Procedure is performed on INITIAL visit ASSESSMENTS - Nursing Assessment / Reassessment X- 1 20 General Physical Exam (combine w/ comprehensive assessment (listed just below) when performed on new pt. evals) X- 1 25 Comprehensive Assessment (HX, ROS, Risk Assessments, Wounds Hx, etc.) ASSESSMENTS - Wound and Skin Assessment / Reassessment []  - 0 Dermatologic / Skin Assessment (not related to wound area) ASSESSMENTS - Ostomy and/or Continence Assessment and Care []  - 0 Incontinence Assessment and Management []  - 0 Ostomy Care Assessment and Management (repouching, etc.) PROCESS - Coordination of Care []  - 0 Simple Patient / Family Education for ongoing care X- 1 20 Complex (extensive) Patient / Family Education for ongoing care X- 1 10 Staff obtains , Records, T Results / Process Orders est []  - 0 Staff telephones HHA, Nursing Homes / Clarify orders / etc []  - 0 Routine Transfer to another Facility (non-emergent condition) []  - 0 Routine Hospital Admission (non-emergent condition) X- 1 15 New Admissions / / Ordering NPWT Apligraf, etc. , []  - 0 Emergency Hospital Admission (emergent condition) PROCESS - Special Needs []  - 0 Pediatric / Minor Patient Management []  - 0 Isolation Patient Management []  - 0 Hearing / Language / Visual special needs []  - 0 Assessment of Community assistance (transportation, D/C planning, etc.) []  - 0 Additional assistance / Altered mentation []  - 0 Support Surface([name]) Assessment (bed, cushion, seat, etc.) INTERVENTIONS - Miscellaneous []  - 0 External ear exam []  - 0 Patient Transfer (multiple staff / / Similar devices) []  - 0 Simple Staple / Suture  removal (25 or less) []  - 0 Complex Staple / Suture removal (26 or more) []  - 0 Hypo/Hyperglycemic Management (do not check if billed separately) []  - 0 Ankle / Brachial Index (ABI) - do not check if billed separately Has the patient been seen at the hospital within the last three years:  Yes Total Score: 90 Level Of Care: New/Established - Level 3 Electronic Signature([name]: 10/01/2019 4:56:28 PM By: Cherylin Mylarwiggins, Shannon Entered By: Cherylin Mylarwiggins, Shannon on 10/01/2019 15:13:13 -------------------------------------------------------------------------------- Compression Therapy Details Patient Name: Date of Service: Harry ChurchFA CUNDO, [name] 10/01/2019 1:15 PM Medical Record Number: [id number] Patient Account Number: [id number] Date of Birth/Sex: Treating RN: [birth year] (54 y.o. Katherina RightM) Dwiggins, Shannon Primary Care Nicholous Girgenti: Gwinda PasseEdwards, Michelle Other Clinician: Referring Kamaiya Antilla: Treating Ceejay Kegley/Extender: Aurelio Brashobson, Michael Edwards, Michelle Weeks in Treatment: 0 Compression Therapy Performed for Wound Assessment: Wound #1 Right Metatarsal head fifth Performed By: Clinician Zenaida DeedBoehlein, Linda, RN Compression Type: Four Layer Post Procedure Diagnosis Same as Pre-procedure Electronic Signature([name]: 10/01/2019 4:56:28 PM By: Cherylin Mylarwiggins, Shannon Entered By: Cherylin Mylarwiggins, Shannon on 10/01/2019 15:05:36 -------------------------------------------------------------------------------- Compression Therapy Details Patient Name: Date of Service: Harry ChurchFA CUNDO, [name] 10/01/2019 1:15 PM Medical Record Number: [id number] Patient Account Number: [id number] Date of Birth/Sex: Treating RN: [birth year] (54 y.o. Katherina RightM) Dwiggins, Shannon Primary Care Ilana Prezioso: Gwinda PasseEdwards, Michelle Other Clinician: Referring Taelon Bendorf: Treating Tammela Bales/Extender: Aurelio Brashobson, Michael Edwards, Michelle Weeks in Treatment: 0 Compression Therapy Performed for Wound Assessment: Wound #2 Right Calcaneus Performed By: Clinician Zenaida DeedBoehlein, Linda,  RN Compression Type: Four Layer Post Procedure Diagnosis Same as Pre-procedure Electronic Signature([name]: 10/01/2019 4:56:28 PM By: Cherylin Mylarwiggins, Shannon Entered By: Cherylin Mylarwiggins, Shannon on 10/01/2019 15:05:36 -------------------------------------------------------------------------------- Compression Therapy Details Patient Name: Date of Service: Harry ChurchFA CUNDO, [name] 10/01/2019 1:15 PM Medical Record Number: [id number] Patient Account Number: [id number] Date of Birth/Sex: Treating RN: [birth year] (54 y.o. Katherina RightM) Dwiggins, Shannon Primary Care Leila Schuff: Gwinda PasseEdwards, Michelle Other Clinician: Referring Norval Slaven: Treating Kairos Panetta/Extender: Aurelio Brashobson, Michael Edwards, Michelle Weeks in Treatment: 0 Compression Therapy Performed for Wound Assessment: Wound #3 Left Metatarsal head fifth Performed By: Clinician Zenaida DeedBoehlein, Linda, RN Compression Type: Four Layer Post Procedure Diagnosis Same as Pre-procedure Electronic Signature([name]: 10/01/2019 4:56:28 PM By: Cherylin Mylarwiggins, Shannon Entered By: Cherylin Mylarwiggins, Shannon on 10/01/2019 15:05:36 -------------------------------------------------------------------------------- Compression Therapy Details Patient Name: Date of Service: Harry ChurchFA CUNDO, [name] 10/01/2019 1:15 PM Medical Record Number: [id number] Patient Account Number: [id number] Date of Birth/Sex: Treating RN: [birth year] (54 y.o. Katherina RightM) Dwiggins, Shannon Primary Care Jashiya Bassett: Gwinda PasseEdwards, Michelle Other Clinician: Referring Isidoro Santillana: Treating Ciarra Braddy/Extender: Aurelio Brashobson, Michael Edwards, Michelle Weeks in Treatment: 0 Compression Therapy Performed for Wound Assessment: Wound #4 Left,Lateral Lower Leg Performed By: Clinician Zenaida DeedBoehlein, Linda, RN Compression Type: Four Layer Post Procedure Diagnosis Same as Pre-procedure Electronic Signature([name]: 10/01/2019 4:56:28 PM By: Cherylin Mylarwiggins, Shannon Entered By: Cherylin Mylarwiggins, Shannon on 10/01/2019  15:05:36 -------------------------------------------------------------------------------- Encounter Discharge Information Details Patient Name: Date of Service: Harry MoroFA CUNDO, Cleon DewA RLO [name] 10/01/2019 1:15 PM Medical Record Number: [id number] Patient Account Number: [id number] Date of Birth/Sex: Treating RN: [birth year] (54 y.o. Katherina RightM) Dwiggins, Shannon Primary Care Mason Burleigh: Gwinda PasseEdwards, Michelle Other Clinician: Referring Moriah Shawley: Treating Konner Warrior/Extender: Aurelio Brashobson, Michael Edwards, Michelle Weeks in Treatment: 0 Encounter Discharge Information Items Post Procedure Vitals Discharge Condition: Stable Temperature (F): 98.3 Ambulatory Status: Wheelchair Pulse (bpm): 71 Discharge Destination: Home Respiratory Rate (breaths/min): 18 Transportation: Other Blood Pressure (mmHg): 114/75 Accompanied By: wife Schedule Follow-up Appointment: Yes Clinical Summary of Care: Patient Declined Electronic Signature([name]: 10/01/2019 5:19:06 PM By: Cherylin Mylarwiggins, Shannon Entered By: Cherylin Mylarwiggins, Shannon on 10/01/2019 17:15:40 -------------------------------------------------------------------------------- Multi Wound Chart Details Patient Name: Date of Service: Harry MoroFA CUNDO, [name] 10/01/2019 1:15 PM Medical Record Number: [id number] Patient Account Number: [id number] Date of Birth/Sex: Treating RN: [birth year] (54 y.o. Katherina RightM) Dwiggins, Shannon Primary Care Odel Schmid: Gwinda PasseEdwards, Michelle Other Clinician: Referring Shanta Hartner: Treating Adeeb Konecny/Extender: Aurelio Brashobson, Michael Edwards, Michelle Weeks in Treatment: 0 Vital Signs Height(in): 68 Pulse(bpm):  71 Weight(lbs): 235 Blood Pressure(mmHg): 114/75 Body Mass Index(BMI): 36 Temperature(F): 98.3 Respiratory Rate(breaths/min): 18 Photos: [1:No Photos Right Metatarsal head fifth] [2:No Photos Right Calcaneus] [3:No Photos Left Metatarsal head fifth] Wound Location: [1:Gradually Appeared] [2:Gradually Appeared] [3:Gradually Appeared] Wounding Event: [1:Pressure Ulcer]  [2:Pressure Ulcer] [3:Pressure Ulcer] Primary Etiology: [1:Hypotension, Type II Diabetes] [2:Hypotension, Type II Diabetes] [3:Hypotension, Type II Diabetes] Comorbid History: [1:08/12/2019] [2:08/12/2019] [3:08/12/2019] Date Acquired: [1:0] [2:0] [3:0] Weeks of Treatment: [1:Open] [2:Open] [3:Open] Wound Status: [1:3.4x1.6x0.1] [2:0.8x2.5x0.2] [3:3x2.4x0.1] Measurements L x W x D (cm) [1:4.273] [2:1.571] [3:5.655] A (cm) : rea [1:0.427] [2:0.314] [3:0.565] Volume (cm) : [1:0.00%] [2:0.00%] [3:0.00%] % Reduction in A rea: [1:0.00%] [2:0.00%] [3:0.00%] % Reduction in Volume: [1:Unstageable/Unclassified] [2:Unstageable/Unclassified] [3:Unstageable/Unclassified] Classification: [1:None Present] [2:None Present] [3:None Present] Exudate A mount: [1:N/A] [2:N/A] [3:N/A] Exudate Type: [1:N/A] [2:N/A] [3:N/A] Exudate Color: [1:None Present (0%)] [2:None Present (0%)] [3:None Present (0%)] Granulation A mount: [1:N/A] [2:N/A] [3:N/A] Granulation Quality: [1:Large (67-100%)] [2:Large (67-100%)] [3:Large (67-100%)] Necrotic A mount: [1:Eschar, Adherent Slough] [2:Eschar, Adherent Slough] [3:Eschar, Adherent Slough] Necrotic Tissue: [1:Fascia: No] [2:Fascia: No] [3:Fascia: No] Exposed Structures: [1:Fat Layer (Subcutaneous Tissue): No Tendon: No Muscle: No Joint: No Bone: No None] [2:Fat Layer (Subcutaneous Tissue): No Tendon: No Muscle: No Joint: No Bone: No None] [3:Fat Layer (Subcutaneous Tissue): No Tendon: No Muscle: No Joint: No Bone: No  None] Epithelialization: [1:Debridement - Excisional] [2:N/A] [3:Debridement - Excisional] Debridement: Pre-procedure Verification/Time Out 14:59 [2:N/A] [3:14:59] Taken: [1:Other] [2:N/A] [3:Other] Pain Control: [1:Necrotic/Eschar, Subcutaneous] [2:N/A] [3:Necrotic/Eschar, Subcutaneous] Tissue Debrided: [1:Skin/Subcutaneous Tissue] [2:N/A] [3:Skin/Subcutaneous Tissue] Level: [1:5.44] [2:N/A] [3:7.2] Debridement A (sq cm): [1:rea Blade, Forceps, Scissors]  [2:N/A] [3:Blade, Forceps, Scissors] Instrument: [1:Moderate] [2:N/A] [3:Moderate] Bleeding: [1:Pressure] [2:N/A] [3:Pressure] Hemostasis A chieved: [1:0] [2:N/A] [3:0] Procedural Pain: [1:0] [2:N/A] [3:0] Post Procedural Pain: [1:Procedure was tolerated well] [2:N/A] [3:Procedure was tolerated well] Debridement Treatment Response: [1:3.4x1.6x0.1] [2:N/A] [3:3x2.4x0.1] Post Debridement Measurements L x W x D (cm) [1:0.427] [2:N/A] [3:0.565] Post Debridement Volume: (cm) [1:Unstageable/Unclassified] [2:N/A] [3:Unstageable/Unclassified] Post Debridement Stage: [1:Compression Therapy] [2:Compression Therapy] [3:Compression Therapy] Procedures Performed: [1:Debridement] [2:4 N/A] [3:Debridement N/A] Photos: [1:No Photos Left, Lateral Lower Leg] [2:N/A N/A] [3:N/A N/A] Wound Location: [1:Pressure Injury] [2:N/A] [3:N/A] Wounding Event: [1:Pressure Ulcer] [2:N/A] [3:N/A] Primary Etiology: [1:Hypotension, Type II Diabetes] [2:N/A] [3:N/A] Comorbid History: [1:08/12/2019] [2:N/A] [3:N/A] Date Acquired: [1:0] [2:N/A] [3:N/A] Weeks of Treatment: [1:Open] [2:N/A] [3:N/A] Wound Status: [1:2.5x1.5x0.1] [2:N/A] [3:N/A] Measurements L x W x D (cm) [1:2.945] [2:N/A] [3:N/A] A (cm) : rea [1:0.295] [2:N/A] [3:N/A] Volume (cm) : [1:0.00%] [2:N/A] [3:N/A] % Reduction in A rea: [1:0.00%] [2:N/A] [3:N/A] % Reduction in Volume: [1:Unstageable/Unclassified] [2:N/A] [3:N/A] Classification: [1:Medium] [2:N/A] [3:N/A] Exudate A mount: [1:Serosanguineous] [2:N/A] [3:N/A] Exudate Type: [1:red, brown] [2:N/A] [3:N/A] Exudate Color: [1:Small (1-33%)] [2:N/A] [3:N/A] Granulation Amount: [1:Red] [2:N/A] [3:N/A] Granulation Quality: [1:Large (67-100%)] [2:N/A] [3:N/A] Necrotic Amount: [1:Eschar, Adherent Slough] [2:N/A] [3:N/A] Necrotic Tissue: [1:Fat Layer (Subcutaneous Tissue): Yes N/A] [3:N/A] Exposed Structures: [1:Fascia: No Tendon: No Muscle: No Joint: No Bone: No None] [2:N/A] [3:N/A] Epithelialization:  [1:N/A] [2:N/A] [3:N/A] Debridement: [1:N/A] [2:N/A] [3:N/A] Pain Control: [1:N/A] [2:N/A] [3:N/A] Tissue Debrided: [1:N/A] [2:N/A] [3:N/A] Level: [1:N/A] [2:N/A] [3:N/A] Debridement A (sq cm): [1:rea N/A] [2:N/A] [3:N/A] Instrument: [1:N/A] [2:N/A] [3:N/A] Bleeding: [1:N/A] [2:N/A] [3:N/A] Hemostasis A chieved: [1:N/A] [2:N/A] [3:N/A] Procedural Pain: [1:N/A] [2:N/A] [3:N/A] Post Procedural Pain: Debridement Treatment Response: N/A [2:N/A] [3:N/A] Post Debridement Measurements L x N/A [2:N/A] [3:N/A] W x D (cm) [1:N/A] [2:N/A] [3:N/A] Post Debridement Volume: (cm) [1:N/A] [2:N/A] [3:N/A] Post Debridement Stage: [1:Compression Therapy] [2:N/A] [3:N/A] Treatment Notes Electronic Signature([name]: 10/01/2019  4:56:28 PM By: Cherylin Mylar Signed: 10/04/2019 7:15:26 AM By: Baltazar Najjar MD Entered By: Baltazar Najjar on 10/01/2019 15:27:18 -------------------------------------------------------------------------------- Multi-Disciplinary Care Plan Details Patient Name: Date of Service: [name], Lucas RLO [name] 10/01/2019 1:15 PM Medical Record Number: [id number] Patient Account Number: [id number] Date of Birth/Sex: Treating RN: 20-Sep-1965 (54 y.o. [name] Primary Care Ita Fritzsche: [name] Other Clinician: Referring Shannie Kontos: Treating Aleeya Veitch/Extender: [name] in Treatment: 0 Active Inactive Nutrition Nursing Diagnoses: Impaired glucose control: actual or potential Goals: Patient/caregiver verbalizes understanding of need to maintain therapeutic glucose control per primary care physician Date Initiated: 10/01/2019 Target Resolution Date: 11/05/2019 Goal Status: Active Interventions: Provide education on elevated blood sugars and impact on wound healing Notes: Pressure Nursing Diagnoses: Knowledge deficit related to causes and risk factors for pressure ulcer development Goals: Patient/caregiver will verbalize risk factors  for pressure ulcer development Date Initiated: 10/01/2019 Target Resolution Date: 11/05/2019 Goal Status: Active Interventions: Provide education on pressure ulcers Notes: Wound/Skin Impairment Nursing Diagnoses: Impaired tissue integrity Goals: Ulcer/skin breakdown will have a volume reduction of 50% by week 8 Date Initiated: 10/01/2019 Target Resolution Date: 11/05/2019 Goal Status: Active Interventions: Provide education on ulcer and skin care Notes: Electronic Signature([name]: 10/01/2019 4:56:28 PM By: Cherylin Mylar Entered By: Cherylin Mylar on 10/01/2019 14:52:33 -------------------------------------------------------------------------------- Pain Assessment Details Patient Name: Date of Service: [name] 10/01/2019 1:15 PM Medical Record Number: [id number] Patient Account Number: [id number] Date of Birth/Sex: Treating RN: 08/20/1965 (53 y.o. [name] Primary Care Susette Seminara: [name] Other Clinician: Referring Salah Nakamura: Treating Honi Name/Extender: [name] in Treatment: 0 Active Problems Location of Pain Severity and Description of Pain Patient Has Paino No Site Locations Pain Management and Medication Current Pain Management: Electronic Signature([name]: 10/08/2019 5:50:16 PM By: Yevonne Pax RN Entered By: Yevonne Pax on 10/01/2019 14:35:47 -------------------------------------------------------------------------------- Patient/Caregiver Education Details Patient Name: Date of Service: [name] RLO [name] 8/20/2021andnbsp1:15 PM Medical Record Number: [id number] Patient Account Number: [id number] Date of Birth/Gender: Treating RN: 12-Nov-1965 (54 y.o. [name] Primary Care Physician: [name] Other Clinician: Referring Physician: Treating Physician/Extender: [name] in Treatment: 0 Education Assessment Education Provided  To: Patient Education Topics Provided Elevated Blood Sugar/ Impact on Healing: Handouts: Elevated Blood Sugars: How Do They Affect Wound Healing Methods: Explain/Verbal Responses: State content correctly Pressure: Handouts: Pressure Ulcers: Care and Offloading Methods: Explain/Verbal Responses: State content correctly Wound/Skin Impairment: Handouts: Caring for Your Ulcer Methods: Explain/Verbal Responses: State content correctly Electronic Signature([name]: 10/01/2019 4:56:28 PM By: Cherylin Mylar Entered By: Cherylin Mylar on 10/01/2019 14:52:51 -------------------------------------------------------------------------------- Wound Assessment Details Patient Name: Date of Service: [name] RLO [name] 10/01/2019 1:15 PM Medical Record Number: [id number] Patient Account Number: [id number] Date of Birth/Sex: Treating RN: 04-Mar-1965 (53 y.o. [name]) Yevonne Pax Primary Care Ankith Edmonston: [name] Other Clinician: Referring Soyla Bainter: Treating Kevonte Vanecek/Extender: [name] in Treatment: 0 Wound Status Wound Number: 1 Primary Etiology: Pressure Ulcer Wound Location: Right Metatarsal head fifth Wound Status: Open Wounding Event: Gradually Appeared Comorbid History: Hypotension, Type II Diabetes Date Acquired: 08/12/2019 Weeks Of Treatment: 0 Clustered Wound: No Photos Photo Uploaded By: Benjaman Kindler on 10/04/2019 14:33:19 Wound Measurements Length: (cm) 3.4 Width: (cm) 1.6 Depth: (cm) 0.1 Area: (cm) 4.273 Volume: (cm) 0.427 % Reduction in Area: 0% % Reduction in Volume: 0% Epithelialization: None Tunneling: No Undermining: No Wound Description Classification: Unstageable/Unclassified Exudate Amount: None Present Foul Odor After Cleansing: No Slough/Fibrino Yes Wound Bed Granulation Amount:  None Present (0%) Exposed Structure Necrotic Amount: Large (67-100%) Fascia Exposed: No Necrotic Quality: Eschar, Adherent  Slough Fat Layer (Subcutaneous Tissue) Exposed: No Tendon Exposed: No Muscle Exposed: No Joint Exposed: No Bone Exposed: No Electronic Signature([name]: 10/08/2019 5:50:16 PM By: Yevonne Pax RN Entered By: Yevonne Pax on 10/01/2019 14:35:35 -------------------------------------------------------------------------------- Wound Assessment Details Patient Name: Date of Service: [name] 10/01/2019 1:15 PM Medical Record Number: [id number] Patient Account Number: [id number] Date of Birth/Sex: Treating RN: February 14, 1965 (53 y.o. [name]) Yevonne Pax Primary Care Lynzi Meulemans: [name] Other Clinician: Referring Netha Dafoe: Treating Ailie Gage/Extender: [name] in Treatment: 0 Wound Status Wound Number: 2 Primary Etiology: Pressure Ulcer Wound Location: Right Calcaneus Wound Status: Open Wounding Event: Gradually Appeared Comorbid History: Hypotension, Type II Diabetes Date Acquired: 08/12/2019 Weeks Of Treatment: 0 Clustered Wound: No Photos Photo Uploaded By: Benjaman Kindler on 10/04/2019 14:33:33 Wound Measurements Length: (cm) 0.8 Width: (cm) 2.5 Depth: (cm) 0.2 Area: (cm) 1.571 Volume: (cm) 0.314 % Reduction in Area: 0% % Reduction in Volume: 0% Epithelialization: None Tunneling: No Undermining: No Wound Description Classification: Unstageable/Unclassified Exudate Amount: None Present Foul Odor After Cleansing: No Slough/Fibrino Yes Wound Bed Granulation Amount: None Present (0%) Exposed Structure Necrotic Amount: Large (67-100%) Fascia Exposed: No Necrotic Quality: Eschar, Adherent Slough Fat Layer (Subcutaneous Tissue) Exposed: No Tendon Exposed: No Muscle Exposed: No Joint Exposed: No Bone Exposed: No Electronic Signature([name]: 10/08/2019 5:50:16 PM By: Yevonne Pax RN Entered By: Yevonne Pax on 10/01/2019 14:32:35 -------------------------------------------------------------------------------- Wound  Assessment Details Patient Name: Date of Service: [name] 10/01/2019 1:15 PM Medical Record Number: [id number] Patient Account Number: [id number] Date of Birth/Sex: Treating RN: May 15, 1965 (53 y.o. [name]) Yevonne Pax Primary Care Eloisa Chokshi: [name] Other Clinician: Referring Crew Goren: Treating Niclas Markell/Extender: [name] in Treatment: 0 Wound Status Wound Number: 3 Primary Etiology: Pressure Ulcer Wound Location: Left Metatarsal head fifth Wound Status: Open Wounding Event: Gradually Appeared Comorbid History: Hypotension, Type II Diabetes Date Acquired: 08/12/2019 Weeks Of Treatment: 0 Clustered Wound: No Wound Measurements Length: (cm) 3 Width: (cm) 2.4 Depth: (cm) 0.1 Area: (cm) 5.655 Volume: (cm) 0.565 % Reduction in Area: 0% % Reduction in Volume: 0% Epithelialization: None Tunneling: No Undermining: No Wound Description Classification: Unstageable/Unclassified Exudate Amount: None Present Foul Odor After Cleansing: No Slough/Fibrino Yes Wound Bed Granulation Amount: None Present (0%) Exposed Structure Necrotic Amount: Large (67-100%) Fascia Exposed: No Necrotic Quality: Eschar, Adherent Slough Fat Layer (Subcutaneous Tissue) Exposed: No Tendon Exposed: No Muscle Exposed: No Joint Exposed: No Bone Exposed: No Electronic Signature([name]: 10/08/2019 5:50:16 PM By: Yevonne Pax RN Entered By: Yevonne Pax on 10/01/2019 14:35:35 -------------------------------------------------------------------------------- Wound Assessment Details Patient Name: Date of Service: [name] 10/01/2019 1:15 PM Medical Record Number: [id number] Patient Account Number: [id number] Date of Birth/Sex: Treating RN: Apr 24, 1965 (53 y.o. [name]) Yevonne Pax Primary Care Madora Barletta: [name] Other Clinician: Referring Joua Bake: Treating Shiane Wenberg/Extender: [name] in Treatment: 0 Wound  Status Wound Number: 4 Primary Etiology: Pressure Ulcer Wound Location: Left, Lateral Lower Leg Wound Status: Open Wounding Event: Pressure Injury Comorbid History: Hypotension, Type II Diabetes Date Acquired: 08/12/2019 Weeks Of Treatment: 0 Clustered Wound: No Photos Photo Uploaded By: Benjaman Kindler on 10/04/2019 14:33:46 Wound Measurements Length: (cm) 2.5 Width: (cm) 1.5 Depth: (cm) 0.1 Area: (cm) 2.945 Volume: (cm) 0.295 % Reduction in Area: 0% % Reduction in Volume: 0% Epithelialization: None Tunneling: No Undermining: No Wound Description Classification: Unstageable/Unclassified Exudate Amount: Medium Exudate Type:  Serosanguineous Exudate Color: red, brown Foul Odor After Cleansing: No Slough/Fibrino Yes Wound Bed Granulation Amount: Small (1-33%) Exposed Structure Granulation Quality: Red Fascia Exposed: No Necrotic Amount: Large (67-100%) Fat Layer (Subcutaneous Tissue) Exposed: Yes Necrotic Quality: Eschar, Adherent Slough Tendon Exposed: No Muscle Exposed: No Joint Exposed: No Bone Exposed: No Electronic Signature([name]: 10/08/2019 5:50:16 PM By: Yevonne Pax RN Entered By: Yevonne Pax on 10/01/2019 14:34:20 -------------------------------------------------------------------------------- Vitals Details Patient Name: Date of Service: [name], [name] 10/01/2019 1:15 PM Medical Record Number: [id number] Patient Account Number: [id number] Date of Birth/Sex: Treating RN: [birth year] (53 y.o. [name]) Yevonne Pax Primary Care Kajuan Guyton: [name] Other Clinician: Referring Jarron Curley: Treating Johnsie Moscoso/Extender: [name] in Treatment: 0 Vital Signs Time Taken: 13:44 Temperature (F): 98.3 Height (in): 68 Pulse (bpm): 71 Source: Stated Respiratory Rate (breaths/min): 18 Weight (lbs): 235 Blood Pressure (mmHg): 114/75 Body Mass Index (BMI): 35.7 Reference Range: 80 - 120 mg / dl Electronic  Signature([name]: 10/08/2019 5:50:16 PM By: Yevonne Pax RN Entered By: Yevonne Pax on 10/01/2019 13:47:15

## 2019-10-09 NOTE — Progress Notes (Signed)
CABE, LASHLEY ([id number]) Visit Report for 10/01/2019 Abuse/Suicide Risk Screen Details Patient Name: Date of Service: [name] 10/01/2019 1:15 PM Medical Record Number: [id number] Patient Account Number: [id number] Date of Birth/Sex: Treating RN: 1965/12/07 (53 y.o. [name]) Yevonne Pax Primary Care Kermitt Harjo: [name] Other Clinician: Referring Darel Ricketts: Treating Taytum Wheller/Extender: [name] in Treatment: 0 Abuse/Suicide Risk Screen Items Answer ABUSE RISK SCREEN: Has anyone close to you tried to hurt or harm you recentlyo No Do you feel uncomfortable with anyone in your familyo No Has anyone forced you do things that you didnt want to doo No Electronic Signature([name]: 10/08/2019 5:50:16 PM By: Yevonne Pax RN Entered By: Yevonne Pax on 10/01/2019 14:00:14 -------------------------------------------------------------------------------- Activities of Daily Living Details Patient Name: Date of Service: [name] 10/01/2019 1:15 PM Medical Record Number: [id number] Patient Account Number: [id number] Date of Birth/Sex: Treating RN: 1965/08/25 (53 y.o. [name]) Yevonne Pax Primary Care Jamar Casagrande: [name] Other Clinician: Referring Tulip Meharg: Treating Thaniel Coluccio/Extender: [name] in Treatment: 0 Activities of Daily Living Items Answer Activities of Daily Living (Please select one for each item) Drive Automobile Not Able T Medications ake Completely Able Use T elephone Completely Able Care for Appearance Need Assistance Use T oilet Not Able Mady Haagensen / Shower Not Able Dress Self Not Able Feed Self Completely Able Walk Not Able Get In / Out Bed Not Able Housework Not Able Prepare Meals Not Able Handle Money Not Able Shop for Self Not Able Electronic Signature([name]: 10/08/2019 5:50:16 PM By: Yevonne Pax RN Entered By: Yevonne Pax on 10/01/2019  14:01:35 -------------------------------------------------------------------------------- Education Screening Details Patient Name: Date of Service: [name], [name] 10/01/2019 1:15 PM Medical Record Number: [id number] Patient Account Number: [id number] Date of Birth/Sex: Treating RN: 1965-06-13 (53 y.o. 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 Memory Deficit: No Emotional Barrier: No Cultural/Religious Beliefs Affecting Medical Care: No Physical Barrier Impaired Vision: No Impaired Hearing: No Decreased Hand dexterity: No Knowledge/Comprehension Knowledge Level: Medium Comprehension Level: Medium Ability to understand written instructions: Medium Ability to understand verbal instructions: Medium Motivation Anxiety Level: Anxious Cooperation: Cooperative Education Importance: Acknowledges Need Interest in Health Problems: Asks Questions Perception: Coherent Willingness to Engage in Self-Management High Activities: Readiness to Engage in Self-Management High Activities: Electronic Signature([name]: 10/08/2019 5:50:16 PM By: Yevonne Pax RN Entered By: Yevonne Pax on 10/01/2019 14:02:54 -------------------------------------------------------------------------------- Fall Risk Assessment Details Patient Name: Date of Service: [name], [name] 10/01/2019 1:15 PM Medical Record Number: [id number] Patient Account Number: [id number] Date of Birth/Sex: Treating RN: September 14, 1965 (53 y.o. [name]) Yevonne Pax Primary Care Philis Doke: [name] Other Clinician: Referring Serin Thornell: Treating Kerry Odonohue/Extender: [name] in Treatment: 0 Fall Risk Assessment Items Have you had 2 or more falls in the last 12 monthso 0 No Have you had any fall that resulted in injury in the last 12 monthso 0 No FALLS RISK SCREEN History of falling - immediate or within 3 months 0 No Secondary diagnosis (Do you have 2 or more medical diagnoseso) 0 No Ambulatory aid None/bed rest/wheelchair/nurse 0 No Crutches/cane/walker 0 No Furniture 0 No Intravenous therapy Access/Saline/Heparin Lock 0 No Gait/Transferring Normal/ bed rest/ wheelchair 0 No Weak (short  steps with or without shuffle, stooped but able to lift head while walking, may seek 0 No support from furniture) Impaired (short steps with shuffle, may have difficulty arising from chair, head down, impaired 0 No balance) Mental Status Oriented to own ability 0 No Electronic Signature([name]: 10/08/2019 5:50:16 PM By: Yevonne Pax RN Entered By: Yevonne Pax on 10/01/2019 14:03:06 -------------------------------------------------------------------------------- Foot Assessment Details Patient Name: Date of Service: [name] 10/01/2019 1:15 PM Medical Record Number: [id number] Patient Account Number: [id number] Date of Birth/Sex: Treating RN: 27-Jul-1965 (53 y.o. [name]) Yevonne Pax Primary Care Aniello Christopoulos: [name] Other Clinician: Referring Paradise Vensel: Treating Nazarene Bunning/Extender: [name] in Treatment: 0 Foot Assessment Items Site Locations + = Sensation present, - = Sensation absent, C = Callus, U = Ulcer R = Redness, W = Warmth, M = Maceration, PU = Pre-ulcerative lesion F = Fissure, [name] = Swelling, D = Dryness Assessment Right: Left: Other Deformity: No No Prior Foot Ulcer: No No Prior Amputation: No No Charcot Joint: No No Ambulatory Status: Non-ambulatory Assistance Device: Wheelchair Gait:  Surveyor, mining) Signed: 10/08/2019 5:50:16 PM By: Yevonne Pax RN Entered By: Yevonne Pax on 10/01/2019 14:25:40 -------------------------------------------------------------------------------- Nutrition Risk Screening Details Patient Name: Date of Service: [name] 10/01/2019 1:15 PM Medical Record Number: [id number] Patient Account Number: [id number] Date of Birth/Sex: Treating RN: [birth year] (53 y.o. [name]) Yevonne Pax Primary Care Daneen Volcy: [name] Other Clinician: Referring Hodan Wurtz: Treating Konnar Ben/Extender: [name] in Treatment: 0 Height (in): 68 Weight (lbs): 235 Body Mass Index (BMI): 35.7 Nutrition Risk Screening Items Score Screening NUTRITION RISK SCREEN: I have an illness or condition that made me change the kind and/or amount of food I eat 0 No I eat fewer than two meals per day 0 No I eat few fruits and vegetables, or milk products 0 No I have three or more drinks of beer, liquor or wine almost every day 0 No I have tooth or mouth problems that make it hard for me to eat 0 No I don't always have enough money to buy the food I need 0 No I eat alone most of the time 0 No I take three or more different prescribed or over-the-counter drugs a day 1 Yes Without wanting to, I have lost or gained 10 pounds in the last six months 2 Yes I am not always physically able to shop, cook and/or feed myself 2 Yes Nutrition Protocols Good Risk Protocol Moderate Risk Protocol 0 Provide education on nutrition High Risk Proctocol Risk Level: Moderate Risk Score: 5 Electronic Signature([name]: 10/08/2019 5:50:16 PM By: Yevonne Pax RN Entered By: Yevonne Pax on 10/01/2019 14:04:17

## 2019-10-09 NOTE — Progress Notes (Signed)
[name] ([id number]) Visit Report for 10/01/2019 Chief Complaint Document Details Patient Name: Date of Service: [name] 10/01/2019 1:15 PM Medical Record Number: [id number] Patient Account Number: [id number] Date of Birth/Sex: Treating RN: 09-14-1965 (54 y.o. [name] Primary Care Provider: [name] Other Clinician: Referring Provider: Treating Provider/Extender: [name] in Treatment: 0 Information Obtained from: Patient Chief Complaint 10/01/2019; the patient is here for review of wounds on his bilateral feet and left lower leg Electronic Signature(s) Signed: 10/04/2019 7:15:26 AM By: Baltazar Najjar MD Entered By: Baltazar Najjar on 10/01/2019 15:32:09 -------------------------------------------------------------------------------- Debridement Details Patient Name: Date of Service: [name], [name] 10/01/2019 1:15 PM Medical Record Number: [id number] Patient Account Number: [id number] Date of Birth/Sex: Treating RN: 07/22/1965 (54 y.o. [name] Primary Care Provider: [name] Other Clinician: Referring Provider: Treating Provider/Extender: [name] in Treatment: 0 Debridement Performed for Assessment: Wound #1 [name] Metatarsal head fifth Performed By: Physician Maxwell Caul., MD Debridement Type: Debridement Level of Consciousness (Pre-procedure): Awake and Alert Pre-procedure Verification/Time Out Yes - 14:59 Taken: Start Time: 14:59 Pain Control: Other : benzocaine, 20% T Area Debrided (L x W): otal 3.4 (cm) x 1.6 (cm) = 5.44 (cm) Tissue and other material debrided: Viable, Non-Viable, Eschar, Subcutaneous Level: Skin/Subcutaneous Tissue Debridement Description: Excisional Instrument: Blade, Forceps, Scissors Bleeding: Moderate Hemostasis Achieved: Pressure End Time: 15:00 Procedural Pain: 0 Post Procedural Pain: 0 Response to Treatment:  Procedure was tolerated well Level of Consciousness (Post- Awake and Alert procedure): Post Debridement Measurements of Total Wound Length: (cm) 3.4 Stage: Unstageable/Unclassified Width: (cm) 1.6 Depth: (cm) 0.1 Volume: (cm) 0.427 Character of Wound/Ulcer Post Debridement: Requires Further Debridement Post Procedure Diagnosis Same as Pre-procedure Electronic Signature(s) Signed: 10/01/2019 4:56:28 PM By: Cherylin Mylar Signed: 10/04/2019 7:15:26 AM By: Baltazar Najjar MD Entered By: Baltazar Najjar on 10/01/2019 15:27:29 -------------------------------------------------------------------------------- Debridement Details Patient Name: Date of Service: [name], [name] 10/01/2019 1:15 PM Medical Record Number: [id number] Patient Account Number: [id number] Date of Birth/Sex: Treating RN: 06/04/1965 (54 y.o. [name] Primary Care Provider: [name] Other Clinician: Referring Provider: Treating Provider/Extender: [name] in Treatment: 0 Debridement Performed for Assessment: Wound #3 Left Metatarsal head fifth Performed By: Physician Maxwell Caul., MD Debridement Type: Debridement Level of Consciousness (Pre-procedure): Awake and Alert Pre-procedure Verification/Time Out Yes - 14:59 Taken: Start Time: 14:59 Pain Control: Other : benzocaine, 20% T Area Debrided (L x W): otal 3 (cm) x 2.4 (cm) = 7.2 (cm) Tissue and other material debrided: Viable, Non-Viable, Eschar, Subcutaneous Level: Skin/Subcutaneous Tissue Debridement Description: Excisional Instrument: Blade, Forceps, Scissors Bleeding: Moderate Hemostasis Achieved: Pressure End Time: 15:00 Procedural Pain: 0 Post Procedural Pain: 0 Response to Treatment: Procedure was tolerated well Level of Consciousness (Post- Awake and Alert procedure): Post Debridement Measurements of Total Wound Length: (cm) 3 Stage: Unstageable/Unclassified Width: (cm)  2.4 Depth: (cm) 0.1 Volume: (cm) 0.565 Character of Wound/Ulcer Post Debridement: Requires Further Debridement Post Procedure Diagnosis Same as Pre-procedure Electronic Signature(s) Signed: 10/01/2019 4:56:28 PM By: Cherylin Mylar Signed: 10/04/2019 7:15:26 AM By: Baltazar Najjar MD Entered By: Baltazar Najjar on 10/01/2019 15:27:41 -------------------------------------------------------------------------------- HPI Details Patient Name: Date of Service: [name], [name] 10/01/2019 1:15 PM Medical Record Number: [id number] Patient Account Number: [id number] Date of Birth/Sex: Treating RN: 27-Jul-1965 (54 y.o. [name] Primary Care Provider: [name] Other Clinician: Referring Provider: Treating Provider/Extender: [name] in Treatment: 0 History of Present Illness  HPI Description: ADMISSION 10/02/2018 This is a 54 year old Spanish-speaking man who arrived accompanied by his wife. 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 Apparently 6 weeks ago his wife noted blisters on his bilateral fifth metatarsal heads and heels which she feels was from excessive pressure on the foot rests of his wheelchair. He was seen in the emergency room early in August and then was admitted to hospital from 8/10 through 8/14 with cellulitis of his feet. He was ultimately discharged on Keflex. Arterial studies were done in the hospital that showed an ABI of 1.61 on the [name] and 1.36 on the left but triphasic waveforms on the left and biphasic on the [name]. DVT rule out study was negative. X-ray of the bilateral feet did not show osteomyelitis. They have been dealing with these wounds at home. I think they are using Betadine. He has Medicaid and does not have home health. He comes in today with wounds on his bilateral plantar fifth metatarsal heads Achilles area of both heels  and an area on the left lateral leg. His wife explains this as he always laterally rotates his legs when he is sitting in the wheelchair or in bed. She even seemed to believe that before his injury he actually walked on the outside of his feet. 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 Paraplegia T1-T6 Electronic Signature(s) Signed: 10/04/2019 7:15:26 AM By: Baltazar Najjar MD Entered By: Baltazar Najjar on 10/01/2019 15:47:01 -------------------------------------------------------------------------------- Physical Exam Details Patient Name: Date of Service: [name] 10/01/2019 1:15 PM Medical Record Number: [id number] Patient Account Number: [id number] Date of Birth/Sex: Treating RN: February 24, 1965 (54 y.o. [name] Primary Care Provider: [name] Other Clinician: Referring Provider: Treating Provider/Extender: [name] in Treatment: 0 Constitutional Sitting or standing Blood Pressure is within target range for patient.. Pulse regular and within target range for patient.Marland Kitchen Respirations regular, non-labored and within target range.. Temperature is normal and within the target range for the patient.Marland Kitchen Appears in no distress. Ears, Nose, Mouth, and Throat Auditory canals and tympanic membranes with normal landmarks. No effusions or erythema.. Cardiovascular Pedal pulses are palpable. Edema present in both extremities. This is nonpitting. Some changes of chronic venous insufficiency.Marland Kitchen Psychiatric appears at normal baseline. Notes Wound exam He has mirror-image wounds on the plantar aspect of the fifth metatarsal heads bilaterally. Both of these covered in thick black eschar. I think this is the reason for the Betadine She has more superficial open wounds on the [name] tip of his heel also the left although this is just about closed. He also has a necrotic  eschar on the left lateral calf. I used pickups and a #15 scalpel to remove the necrotic eschar and subcutaneous debris from the wounds on the fifth metatarsal heads. Hemostasis with pressure dressings. Electronic Signature(s) Signed: 10/04/2019 7:15:26 AM By: Baltazar Najjar MD Entered By: Baltazar Najjar on 10/01/2019 15:48:36 -------------------------------------------------------------------------------- Physician Orders Details Patient Name: Date of Service: [name], [name] 10/01/2019 1:15 PM Medical Record Number: [id number] Patient Account Number: [id number] Date of Birth/Sex: Treating RN: 10/14/1965 (54 y.o. [name] Primary Care Provider: [name] Other Clinician: Referring Provider: Treating Provider/Extender: [name] in Treatment: 0 Verbal / Phone Orders: No Diagnosis Coding Follow-up Appointments Return Appointment in 1 week. Dressing Change Frequency Do not change entire  dressing for one week. Wound Cleansing May shower with protection. - Dressing cannot get wet. Use cast protector if taking a shower Primary Wound Dressing Wound #1 [name] Metatarsal head fifth Iodoflex Wound #2 [name] Calcaneus Iodoflex Wound #3 Left Metatarsal head fifth Iodoflex Wound #4 Left,Lateral Lower Leg Iodoflex Secondary Dressing Dry Gauze ABD pad Zetuvit or Kerramax Edema Control 3 Layer Compression System - Bilateral Elevate legs to the level of the heart or above for 30 minutes daily and/or when sitting, a frequency of: Off-Loading Other: - avoid pressure to sites. Electronic Signature(s) Signed: 10/01/2019 4:56:28 PM By: Cherylin Mylar Signed: 10/04/2019 7:15:26 AM By: Baltazar Najjar MD Entered By: Cherylin Mylar on 10/01/2019 15:04:59 -------------------------------------------------------------------------------- Problem List Details Patient Name: Date of Service: [name], [name] 10/01/2019 1:15 PM Medical  Record Number: [id number] Patient Account Number: [id number] Date of Birth/Sex: Treating RN: 29-May-1965 (54 y.o. 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, stage 3 10/01/2019 No Yes L89.623 Pressure ulcer of left heel, stage 3 10/01/2019 No Yes E11.621 Type 2 diabetes mellitus with foot ulcer 10/01/2019 No Yes L97.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 10/01/2019 No Yes G82.21 Paraplegia, complete 10/01/2019 No Yes Inactive Problems Resolved Problems Electronic Signature(s) Signed: 10/04/2019 7:15:26 AM By: Baltazar Najjar MD Entered By: Baltazar Najjar on 10/01/2019 15:51:29 -------------------------------------------------------------------------------- Progress Note Details Patient Name: Date of Service: [name], [name] 10/01/2019 1:15 PM Medical Record Number: [id number] Patient Account Number: [id number] Date of Birth/Sex: Treating RN: January 04, 1966 (54 y.o. [name] Primary Care Provider: [name] Other Clinician: Referring Provider: Treating Provider/Extender: [name] in Treatment: 0 Subjective Chief Complaint Information obtained from Patient 10/01/2019; the patient is here for review of wounds on his bilateral feet and left lower leg History of Present Illness (HPI) ADMISSION 10/02/2018 This is a 54 year old Spanish-speaking man who arrived accompanied by his wife. Predominant medical problem is T1-T6 spinal cord  paraplegia secondary to trauma after falling off a roof roughly a year ago. Apparently 6 weeks ago his wife noted blisters on his bilateral fifth metatarsal heads and heels which she feels was from excessive pressure on the foot rests of his wheelchair. He was seen in the emergency room early in August and then was admitted to hospital from 8/10 through 8/14 with cellulitis of his feet. He was ultimately discharged on Keflex. Arterial studies were done in the hospital that showed an ABI of 1.61 on the [name] and 1.36 on the left but triphasic waveforms on the left and biphasic on the [name]. DVT rule out study was negative. X-ray of the bilateral feet did not show osteomyelitis. They have been dealing with these wounds at home. I think they are using Betadine. He has Medicaid and does not have home health. He comes in today with wounds on his bilateral plantar fifth metatarsal heads Achilles area of both heels and an area on the left lateral leg. His wife explains this as he always laterally rotates his legs when he is sitting in the wheelchair or in bed. She even seemed to believe that before his injury he  actually walked on the outside of his feet. 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 Paraplegia T1-T6 Patient History Information obtained from Patient. Allergies No Known Allergies Family History Diabetes - Mother,Father,Siblings, Hypertension - Mother,Father, No family history of Cancer, Heart Disease, Hereditary Spherocytosis, Kidney Disease, Lung Disease, Seizures, Stroke, Thyroid Problems. Social History Never smoker, Marital Status - Married, Alcohol Use - Rarely, Drug Use - No History, Caffeine Use - Daily. Medical History Eyes Denies history of Cataracts, Glaucoma, Optic Neuritis Ear/Nose/Mouth/Throat Denies history of Chronic sinus problems/congestion, Middle ear problems Hematologic/Lymphatic Denies  history of Anemia, Hemophilia, Human Immunodeficiency Virus, Lymphedema, Sickle Cell Disease Respiratory Denies history of Aspiration, Asthma, Chronic Obstructive Pulmonary Disease (COPD), Pneumothorax, Sleep Apnea, Tuberculosis Cardiovascular Patient has history of Hypotension Denies history of Angina, Arrhythmia, Congestive Heart Failure, Coronary Artery Disease, Deep Vein Thrombosis, Hypertension, Myocardial Infarction, Peripheral Arterial Disease, Peripheral Venous Disease, Phlebitis, Vasculitis Gastrointestinal Denies history of Cirrhosis , Colitis, Crohnoos, Hepatitis A, Hepatitis B, Hepatitis C Endocrine Patient has history of Type II Diabetes Denies history of Type I Diabetes Genitourinary Denies history of End Stage Renal Disease Immunological Denies history of Lupus Erythematosus, Raynaudoos, Scleroderma Integumentary (Skin) Denies history of History of Burn Musculoskeletal Denies history of Gout, Rheumatoid Arthritis, Osteoarthritis, Osteomyelitis Neurologic Denies history of Dementia, Neuropathy, Quadriplegia, Paraplegia, Seizure Disorder Oncologic Denies history of Received Chemotherapy, Received Radiation Psychiatric Denies history of Anorexia/bulimia, Confinement Anxiety Patient is treated with Oral Agents. Blood sugar is not tested. Review of Systems (ROS) Constitutional Symptoms (General Health) Denies complaints or symptoms of Fatigue, Fever, Chills, Marked Weight Change. Eyes Denies complaints or symptoms of Dry Eyes, Vision Changes, Glasses / Contacts. Ear/Nose/Mouth/Throat Denies complaints or symptoms of Chronic sinus problems or rhinitis. Respiratory Denies complaints or symptoms of Chronic or frequent coughs, Shortness of Breath. Cardiovascular Denies complaints or symptoms of Chest pain. Gastrointestinal Denies complaints or symptoms of Frequent diarrhea, Nausea, Vomiting. Endocrine Complains or has symptoms of Heat/cold  intolerance. Genitourinary Denies complaints or symptoms of Frequent urination. Integumentary (Skin) Complains or has symptoms of Wounds. Musculoskeletal Denies complaints or symptoms of Muscle Pain, Muscle Weakness. Neurologic Denies complaints or symptoms of Numbness/parasthesias. Psychiatric Denies complaints or symptoms of Claustrophobia, Suicidal. Objective Constitutional Sitting or standing Blood Pressure is within target range for patient.. Pulse regular and within target range for patient.Marland Kitchen. Respirations regular, non-labored and within target range.. Temperature is normal and within the target range for the patient.Marland Kitchen. Appears in no distress. Vitals Time Taken: 1:44 PM, Height: 68 in, Source: Stated, Weight: 235 lbs, BMI: 35.7, Temperature: 98.3 F, Pulse: 71 bpm, Respiratory Rate: 18 breaths/min, Blood Pressure: 114/75 mmHg. Ears, Nose, Mouth, and Throat Auditory canals and tympanic membranes with normal landmarks. No effusions or erythema.. Cardiovascular Pedal pulses are palpable. Edema present in both extremities. This is nonpitting. Some changes of chronic venous insufficiency.Marland Kitchen. Psychiatric appears at normal baseline. General Notes: Wound exam ooHe has mirror-image wounds on the plantar aspect of the fifth metatarsal heads bilaterally. Both of these covered in thick black eschar. I think this is the reason for the Betadine ooShe has more superficial open wounds on the [name] tip of his heel also the left although this is just about closed. ooHe also has a necrotic eschar on the left lateral calf. I used pickups and a #15 scalpel to remove the necrotic eschar and subcutaneous debris from the wounds on the fifth metatarsal heads. Hemostasis with pressure dressings. Integumentary (Hair, Skin) Wound #  1 status is Open. Original cause of wound was Gradually Appeared. The wound is located on the [name] Metatarsal head fifth. The wound measures 3.4cm length x 1.6cm width x 0.1cm  depth; 4.273cm^2 area and 0.427cm^3 volume. There is no tunneling or undermining noted. There is a none present amount of drainage noted. There is no granulation within the wound bed. There is a large (67-100%) amount of necrotic tissue within the wound bed including Eschar and Adherent Slough. Wound #2 status is Open. Original cause of wound was Gradually Appeared. The wound is located on the [name] Calcaneus. The wound measures 0.8cm length x 2.5cm width x 0.2cm depth; 1.571cm^2 area and 0.314cm^3 volume. There is no tunneling or undermining noted. There is a none present amount of drainage noted. There is no granulation within the wound bed. There is a large (67-100%) amount of necrotic tissue within the wound bed including Eschar and Adherent Slough. Wound #3 status is Open. Original cause of wound was Gradually Appeared. The wound is located on the Left Metatarsal head fifth. The wound measures 3cm length x 2.4cm width x 0.1cm depth; 5.655cm^2 area and 0.565cm^3 volume. There is no tunneling or undermining noted. There is a none present amount of drainage noted. There is no granulation within the wound bed. There is a large (67-100%) amount of necrotic tissue within the wound bed including Eschar and Adherent Slough. Wound #4 status is Open. Original cause of wound was Pressure Injury. The wound is located on the Left,Lateral Lower Leg. The wound measures 2.5cm length x 1.5cm width x 0.1cm depth; 2.945cm^2 area and 0.295cm^3 volume. There is Fat Layer (Subcutaneous Tissue) Exposed exposed. There is no tunneling or undermining noted. There is a medium amount of serosanguineous drainage noted. There is small (1-33%) red granulation within the wound bed. There is a large (67-100%) amount of necrotic tissue within the wound bed including Eschar and Adherent Slough. 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 There was a Excisional Skin/Subcutaneous Tissue Debridement with a total area of 5.44 sq cm performed by Maxwell Caul., MD. With the following instrument(s): Blade, Forceps, and Scissors to remove Viable and Non-Viable tissue/material. Material removed includes Eschar and Subcutaneous Tissue and after achieving pain control using Other (benzocaine, 20%). No specimens were taken. A time out was conducted at 14:59, prior to the start of the procedure. A Moderate amount of bleeding was controlled with Pressure. The procedure was tolerated well with a pain level of 0 throughout and a pain level of 0 following the procedure. Post Debridement Measurements: 3.4cm length x 1.6cm width x 0.1cm depth; 0.427cm^3 volume. Post debridement Stage noted as Unstageable/Unclassified. Character of Wound/Ulcer Post Debridement requires further debridement. Post procedure Diagnosis Wound #1: Same as Pre-Procedure Pre-procedure diagnosis of Wound #1 is a Pressure Ulcer located on the [name] Metatarsal head fifth . There was a Four Layer Compression Therapy Procedure by Zenaida Deed, RN. Post procedure Diagnosis Wound #1: Same as Pre-Procedure Wound #3 Pre-procedure diagnosis of Wound #3 is a Pressure Ulcer located on the Left Metatarsal head fifth . There was a Excisional Skin/Subcutaneous Tissue  Debridement with a total area of 7.2 sq cm performed by Maxwell Caul., MD. With the following instrument(s): Blade, Forceps, and Scissors to remove Viable and Non-Viable tissue/material. Material removed includes Eschar and Subcutaneous Tissue and after  achieving pain control using Other (benzocaine, 20%). No specimens were taken. A time out was conducted at 14:59, prior to the start of the procedure. A Moderate amount of bleeding was controlled with Pressure. The procedure was tolerated well with a pain level of 0 throughout and a pain level of 0 following the procedure. Post Debridement Measurements: 3cm length x 2.4cm width x 0.1cm depth; 0.565cm^3 volume. Post debridement Stage noted as Unstageable/Unclassified. Character of Wound/Ulcer Post Debridement requires further debridement. Post procedure Diagnosis Wound #3: Same as Pre-Procedure Pre-procedure diagnosis of Wound #3 is a Pressure Ulcer located on the Left Metatarsal head fifth . There was a Four Layer Compression Therapy Procedure by Zenaida Deed, RN. Post procedure Diagnosis Wound #3: Same as Pre-Procedure Wound #2 Pre-procedure diagnosis of Wound #2 is a Pressure Ulcer located on the [name] Calcaneus . There was a Four Layer Compression Therapy Procedure by Zenaida Deed, RN. Post procedure Diagnosis Wound #2: Same as Pre-Procedure Wound #4 Pre-procedure diagnosis of Wound #4 is a Pressure Ulcer located on the Left,Lateral Lower Leg . There was a Four Layer Compression Therapy Procedure by Zenaida Deed, RN. Post procedure Diagnosis Wound #4: Same as Pre-Procedure Plan Follow-up Appointments: Return Appointment in 1 week. Dressing Change Frequency: Do not change entire dressing for one week. Wound Cleansing: May shower with protection. - Dressing cannot get wet. Use cast protector if taking a shower Primary Wound Dressing: Wound #1 [name] Metatarsal head fifth: Iodoflex Wound #2 [name] Calcaneus: Iodoflex Wound #3 Left Metatarsal head fifth: Iodoflex Wound #4 Left,Lateral Lower Leg: Iodoflex Secondary Dressing: Dry Gauze ABD pad Zetuvit or Kerramax Edema Control: 3 Layer Compression System - Bilateral Elevate legs to the level of the heart or above for 30  minutes daily and/or when sitting, a frequency of: Off-Loading: Other: - avoid pressure to sites. 1. It does seem that these are pressure ulcers. He does not have an arterial issue. He does have some venous insufficiency. 2. The fact that he is Medicaid and does not have home health complicated the dressing decisions here. After careful consideration I put Iodoflex on the wound and put him in 3 layer compression bilaterally that way we do not have to worry about the adequacy of wound care supplies. It will also control the edema around the left lateral leg wound. 3. Further debridement mechanically is likely be necessary to get a satisfactory wound surface 4. No obvious evidence of ischemia or infection no cultures were done. 5. We counseled her to keep the pressure off these areas in the wheelchair I think they have already picked up on this. I spent 45 minutes in review of this patient's past medical record, discussion with the patient and his wife through a Spanish language interpreter and preparation of this record Electronic Signature(s) Signed: 10/04/2019 7:15:26 AM By: Baltazar Najjar MD Entered By: Baltazar Najjar on 10/01/2019 15:50:20 -------------------------------------------------------------------------------- HxROS Details Patient Name: Date of Service: [name], [name] 10/01/2019 1:15 PM Medical Record Number: [id number] Patient Account Number: [id number] Date of Birth/Sex: Treating RN: Jul 06, 1965 (53 y.o. [name] Primary Care Provider: [name] Other Clinician: Referring Provider: Treating Provider/Extender: [name] in Treatment: 0 Information Obtained From Patient Constitutional Symptoms (General Health) Complaints and Symptoms: Negative for: Fatigue; Fever; Chills;  Marked Weight Change Eyes Complaints and Symptoms: Negative for: Dry Eyes; Vision Changes; Glasses / Contacts Medical History: Negative for: Cataracts;  Glaucoma; Optic Neuritis Ear/Nose/Mouth/Throat Complaints and Symptoms: Negative for: Chronic sinus problems or rhinitis Medical History: Negative for: Chronic sinus problems/congestion; Middle ear problems Respiratory Complaints and Symptoms: Negative for: Chronic or frequent coughs; Shortness of Breath Medical History: Negative for: Aspiration; Asthma; Chronic Obstructive Pulmonary Disease (COPD); Pneumothorax; Sleep Apnea; Tuberculosis Cardiovascular Complaints and Symptoms: Negative for: Chest pain Medical History: Positive for: Hypotension Negative for: Angina; Arrhythmia; Congestive Heart Failure; Coronary Artery Disease; Deep Vein Thrombosis; Hypertension; Myocardial Infarction; Peripheral Arterial Disease; Peripheral Venous Disease; Phlebitis; Vasculitis Gastrointestinal Complaints and Symptoms: Negative for: Frequent diarrhea; Nausea; Vomiting Medical History: Negative for: Cirrhosis ; Colitis; Crohns; Hepatitis A; Hepatitis B; Hepatitis C Endocrine Complaints and Symptoms: Positive for: Heat/cold intolerance Medical History: Positive for: Type II Diabetes Negative for: Type I Diabetes Time with diabetes: 5 years Treated with: Oral agents Blood sugar tested every day: No Genitourinary Complaints and Symptoms: Negative for: Frequent urination Medical History: Negative for: End Stage Renal Disease Integumentary (Skin) Complaints and Symptoms: Positive for: Wounds Medical History: Negative for: History of Burn Musculoskeletal Complaints and Symptoms: Negative for: Muscle Pain; Muscle Weakness Medical History: Negative for: Gout; Rheumatoid Arthritis; Osteoarthritis; Osteomyelitis Neurologic Complaints and Symptoms: Negative for: Numbness/parasthesias Medical History: Negative for: Dementia; Neuropathy; Quadriplegia; Paraplegia; Seizure Disorder Psychiatric Complaints and Symptoms: Negative for: Claustrophobia; Suicidal Medical History: Negative for:  Anorexia/bulimia; Confinement Anxiety Hematologic/Lymphatic Medical History: Negative for: Anemia; Hemophilia; Human Immunodeficiency Virus; Lymphedema; Sickle Cell Disease Immunological Medical History: Negative for: Lupus Erythematosus; Raynauds; Scleroderma Oncologic Medical History: Negative for: Received Chemotherapy; Received Radiation Immunizations Pneumococcal Vaccine: Received Pneumococcal Vaccination: No Implantable Devices None Family and Social History Cancer: No; Diabetes: Yes - Mother,Father,Siblings; Heart Disease: No; Hereditary Spherocytosis: No; Hypertension: Yes - Mother,Father; Kidney Disease: No; Lung Disease: No; Seizures: No; Stroke: No; Thyroid Problems: No; Never smoker; Marital Status - Married; Alcohol Use: Rarely; Drug Use: No History; Caffeine Use: Daily; Financial Concerns: No; Food, Clothing or Shelter Needs: No; Support System Lacking: No; Transportation Concerns: No Electronic Signature(s) Signed: 10/04/2019 7:15:26 AM By: Baltazar Najjar MD Signed: 10/08/2019 5:50:16 PM By: Yevonne Pax RN Entered By: Yevonne Pax on 10/01/2019 13:59:59 -------------------------------------------------------------------------------- SuperBill Details Patient Name: Date of Service: [name], [name] 10/01/2019 Medical Record Number: [id number] Patient Account Number: [id number] Date of Birth/Sex: Treating RN: Sep 21, 1965 (54 y.o. 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, complete Facility Procedures CPT4 Code: [id number] Description: 99213 - WOUND CARE VISIT-LEV 3 EST PT Modifier: 25 Quantity: 1 CPT4 Code: [id number] Description: 11042 - DEB SUBQ TISSUE 20 SQ CM/< ICD-10 Diagnosis Description 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 with other specified sever L97.528 Non-pressure chronic ulcer of other part of left foot with other specified  severi Modifier: ity ty Quantity: 1 Physician Procedures : CPT4 Code Description Modifier [id number] 99204 - WC PHYS LEVEL 4 - NEW PT 25 ICD-10 Diagnosis Description 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  with other specified severity L97.528 Non-pressure chronic ulcer of other part of  left foot with other specified severity 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

## 2019-10-10 NOTE — Progress Notes (Signed)
CHOU, BUSLER ([id number]) Visit Report for 10/08/2019 Debridement Details Patient Name: Date of Service: [name] 10/08/2019 1:00 PM Medical Record Number: [id number] Patient Account Number: [id number] Date of Birth/Sex: Treating RN: 1965-05-03 (54 y.o. [name] Primary Care Provider: [name] Other Clinician: Referring Provider: Treating Provider/Extender: [name] in Treatment: 1 Debridement Performed for Assessment: Wound #1 [name] Metatarsal head fifth Performed By: Physician Maxwell Caul., MD Debridement Type: Debridement Level of Consciousness (Pre-procedure): Awake and Alert Pre-procedure Verification/Time Out Yes - 14:00 Taken: Start Time: 14:00 Pain Control: Other : benzocaine, 20% T Area Debrided (L x W): otal 4 (cm) x 2.1 (cm) = 8.4 (cm) Tissue and other material debrided: Viable, Non-Viable, Eschar, Subcutaneous Level: Skin/Subcutaneous Tissue Debridement Description: Excisional Instrument: Curette Bleeding: Moderate Hemostasis Achieved: Pressure End Time: 14:01 Procedural Pain: 0 Post Procedural Pain: 0 Response to Treatment: Procedure was tolerated well Level of Consciousness (Post- Awake and Alert procedure): Post Debridement Measurements of Total Wound Length: (cm) 4 Stage: Unstageable/Unclassified Width: (cm) 2.1 Depth: (cm) 0.1 Volume: (cm) 0.66 Character of Wound/Ulcer Post Debridement: Requires Further Debridement Post Procedure Diagnosis Same as Pre-procedure Electronic Signature([name]: 10/08/2019 5:56:33 PM By: Cherylin Mylar Signed: 10/10/2019 7:39:18 AM By: Baltazar Najjar MD Entered By: Baltazar Najjar on 10/08/2019 14:24:13 -------------------------------------------------------------------------------- Debridement Details Patient Name: Date of Service: [name] 10/08/2019 1:00 PM Medical Record Number: [id number] Patient Account Number: [id number] Date of  Birth/Sex: Treating RN: Apr 08, 1965 (54 y.o. [name] Primary Care Provider: [name] Other Clinician: Referring Provider: Treating Provider/Extender: [name] in Treatment: 1 Debridement Performed for Assessment: Wound #3 Left Metatarsal head fifth Performed By: Physician Maxwell Caul., MD Debridement Type: Debridement Level of Consciousness (Pre-procedure): Awake and Alert Pre-procedure Verification/Time Out Yes - 14:00 Taken: Start Time: 14:00 Pain Control: Other : benzocaine, 20% T Area Debrided (L x W): otal 4 (cm) x 2.5 (cm) = 10 (cm) Tissue and other material debrided: Viable, Non-Viable, Eschar, Subcutaneous Level: Skin/Subcutaneous Tissue Debridement Description: Excisional Instrument: Curette Bleeding: Moderate Hemostasis Achieved: Pressure End Time: 14:01 Procedural Pain: 0 Post Procedural Pain: 0 Response to Treatment: Procedure was tolerated well Level of Consciousness (Post- Awake and Alert procedure): Post Debridement Measurements of Total Wound Length: (cm) 4 Stage: Unstageable/Unclassified Width: (cm) 2.5 Depth: (cm) 0.1 Volume: (cm) 0.785 Character of Wound/Ulcer Post Debridement: Requires Further Debridement Post Procedure Diagnosis Same as Pre-procedure Electronic Signature([name]: 10/08/2019 5:56:33 PM By: Cherylin Mylar Signed: 10/10/2019 7:39:18 AM By: Baltazar Najjar MD Entered By: Baltazar Najjar on 10/08/2019 14:24:22 -------------------------------------------------------------------------------- Debridement Details Patient Name: Date of Service: [name] 10/08/2019 1:00 PM Medical Record Number: [id number] Patient Account Number: [id number] Date of Birth/Sex: Treating RN: 1965/12/28 (54 y.o. [name] Primary Care Provider: [name] Other Clinician: Referring Provider: Treating Provider/Extender: [name] in  Treatment: 1 Debridement Performed for Assessment: Wound #4 Left,Lateral Lower Leg Performed By: Physician Maxwell Caul., MD Debridement Type: Debridement Level of Consciousness (Pre-procedure): Awake and Alert Pre-procedure Verification/Time Out Yes - 14:00 Taken: Start Time: 14:00 Pain Control: Other : benzocaine, 20% T Area Debrided (L x W): otal 2.5 (cm) x 1.6 (cm) = 4 (cm) Tissue and other material debrided: Viable, Non-Viable, Eschar, Subcutaneous Level: Skin/Subcutaneous Tissue Debridement Description: Excisional Instrument: Curette Bleeding: Moderate Hemostasis Achieved: Pressure End Time: 14:01 Procedural Pain: 0 Post Procedural Pain: 0 Response to Treatment: Procedure was tolerated well Level of Consciousness (Post- Awake and Alert procedure):  Post Debridement Measurements of Total Wound Length: (cm) 2.5 Stage: Unstageable/Unclassified Width: (cm) 1.6 Depth: (cm) 0.1 Volume: (cm) 0.314 Character of Wound/Ulcer Post Debridement: Requires Further Debridement Post Procedure Diagnosis Same as Pre-procedure Electronic Signature([name]: 10/08/2019 5:56:33 PM By: Cherylin Mylarwiggins, Shannon Signed: 10/10/2019 7:39:18 AM By: Baltazar Najjarobson, Jozelynn Danielson MD Entered By: Baltazar Najjarobson, Calin Fantroy on 10/08/2019 14:24:32 -------------------------------------------------------------------------------- HPI Details Patient Name: Date of Service: [name] 10/08/2019 1:00 PM Medical Record Number: [id number] Patient Account Number: [id number] Date of Birth/Sex: Treating RN: [birth year] (54 y.o. Harry RightM) Dwiggins, Shannon Primary Care Provider: Gwinda PasseEdwards, Michelle Other Clinician: Referring Provider: Treating Provider/Extender: Aurelio Brashobson, Dierdra Salameh Edwards, Michelle Weeks in Treatment: 1 History of Present Illness HPI Description: ADMISSION 10/02/2018 This is a 54 year old Spanish-speaking man who arrived accompanied by his wife. 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 Apparently 6 weeks ago his wife noted blisters on his bilateral fifth metatarsal heads and heels which she feels was from excessive pressure on the foot rests of his wheelchair. He was seen in the emergency room early in August and then was admitted to hospital from 8/10 through 8/14 with cellulitis of his feet. He was ultimately discharged on Keflex. Arterial studies were done in the hospital that showed an ABI of 1.61 on the [name] and 1.36 on the left but triphasic waveforms on the left and biphasic on the [name]. DVT rule out study was negative. X-ray of the bilateral feet did not show osteomyelitis. They have been dealing with these wounds at home. I think they are using Betadine. He has Medicaid and does not have home health. He comes in today with wounds on his bilateral plantar fifth metatarsal heads Achilles area of both heels and an area on the left lateral leg. His wife explains this as he always laterally rotates his legs when he is sitting in the wheelchair or in bed. She even seemed to believe that before his injury he actually walked on the outside of his feet. 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 Paraplegia T1-T6 8/27; the patient has 4 open wounds mirror-image areas over the plantar fifth metatarsal heads. Area on the [name] Achilles area and then on the left lateral calf. We have been using Iodoflex. He is on [employer] Electronic Signature([name]: 10/10/2019 7:39:18 AM By: Baltazar Najjarobson, Karmen Altamirano MD Entered By: Baltazar Najjarobson, Najae Filsaime on 10/08/2019 14:25:14 -------------------------------------------------------------------------------- Physical Exam Details Patient Name: Date of Service: [name] 10/08/2019 1:00 PM Medical Record Number: [id number] Patient Account Number: [id number] Date of Birth/Sex: Treating RN: [birth year] (54 y.o. Harry RightM) Dwiggins,  Shannon Primary Care Provider: Gwinda PasseEdwards, Michelle Other Clinician: Referring Provider: Treating Provider/Extender: Aurelio Brashobson, Kerra Guilfoil Edwards, Michelle Weeks in Treatment: 1 Respiratory work of breathing is normal. Cardiovascular Pedal pulses are robust. Foot is warm. Notes Wound exam He has mirror-image wounds on the fifth metatarsal heads bilaterally. Used a #5 curette to attempt debridement of the fifth metatarsal heads although this is extremely difficult and he bleeds freely likely related to Eliquis Also black eschar on the left lateral calf which I removed with a #5 curette. The area on the [name] Achilles part of his heel does not have a completely viable surface but I did not debride this today Electronic Signature([name]: 10/10/2019 7:39:18 AM By: Baltazar Najjarobson, Vestal Crandall MD Entered By: Baltazar Najjarobson, Arbell Wycoff on 10/08/2019 14:26:40 -------------------------------------------------------------------------------- Physician Orders Details Patient Name: Date of Service:  [name] 10/08/2019 1:00 PM Medical Record Number: [id number] Patient Account Number: [id number] Date of Birth/Sex: Treating RN: [birth year] (54 y.o. 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 Dressing Change Frequency Do not  change entire dressing for one week. Wound Cleansing May shower with protection. - Dressing cannot get wet. Use cast protector if taking a shower Primary Wound Dressing Wound #1 [name] Metatarsal head fifth Iodoflex Wound #2 [name] Calcaneus Iodoflex Wound #3 Left Metatarsal head fifth Iodoflex Wound #4 Left,Lateral Lower Leg Iodoflex Secondary Dressing Dry Gauze ABD pad Zetuvit or Kerramax Edema Control 3 Layer Compression System - Bilateral Elevate legs to the level of the heart or above for 30 minutes daily and/or when sitting, a frequency of: Off-Loading Other: - avoid pressure to sites. Electronic Signature([name]: 10/08/2019 6:01:16 PM By: Zandra Abts RN, BSN Signed: 10/10/2019 7:39:18 AM By: Baltazar Najjar MD Entered By: Zandra Abts on 10/08/2019 16:34:44 -------------------------------------------------------------------------------- Problem List Details Patient Name: Date of Service: [name] 10/08/2019 1:00 PM Medical Record Number: [id number] Patient Account Number: [id number] Date of Birth/Sex: Treating RN: Oct 07, 1965 (54 y.o. 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 with other specified 10/01/2019 No Yes severity L97.528 Non-pressure chronic ulcer of other part of left foot with other specified 10/01/2019 No Yes severity L89.613 Pressure ulcer of [name] heel, stage 3 10/01/2019 No Yes L89.623 Pressure ulcer of left heel, stage 3 10/01/2019 No Yes E11.621 Type 2 diabetes mellitus with foot ulcer 10/01/2019 No Yes L97.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 [name] Primary Care Provider: [name] Other Clinician: Referring Provider: Treating Provider/Extender: [name] in Treatment: 1 Subjective History of Present Illness (HPI) ADMISSION 10/02/2018 This is a 54 year old Spanish-speaking man who arrived accompanied by his wife. 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 Apparently 6 weeks ago his wife noted blisters on his bilateral fifth metatarsal heads and heels which she feels was from excessive pressure on the foot rests of his wheelchair. He was seen in the emergency room early in August and then was admitted to hospital from 8/10 through 8/14 with cellulitis of his feet. He was ultimately discharged on Keflex. Arterial studies were done in the hospital that showed an ABI of  1.61 on the [name] and 1.36 on the left but triphasic waveforms on the left and biphasic on the [name]. DVT rule out study was negative. X-ray of the bilateral feet did not show osteomyelitis. They have been dealing with these wounds at home. I think they are using Betadine. He has Medicaid and does not have home health. He comes in today with wounds on his bilateral plantar fifth metatarsal heads Achilles area of both heels and an area on the left lateral leg. His wife explains this as he always laterally rotates his legs when he is sitting in the wheelchair or in bed. She even seemed to believe  that before his injury he actually walked on the outside of his feet. 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 Paraplegia T1-T6 8/27; the patient has 4 open wounds mirror-image areas over the plantar fifth metatarsal heads. Area on the [name] Achilles area and then on the left lateral calf. We have been using Iodoflex. He is on Eliquis Objective Constitutional Vitals Time Taken: 1:26 PM, Height: 68 in, Weight: 235 lbs, BMI: 35.7, Temperature: 97.9 F, Pulse: 66 bpm, Respiratory Rate: 18 breaths/min, Blood Pressure: 103/68 mmHg. Respiratory work of breathing is normal. Cardiovascular Pedal pulses are robust. Foot is warm. General Notes: Wound exam ooHe has mirror-image wounds on the fifth metatarsal heads bilaterally. Used a #5 curette to attempt debridement of the fifth metatarsal heads although this is extremely difficult and he bleeds freely likely related to Eliquis ooAlso black eschar on the left lateral calf which I removed with a #5 curette. ooThe area on the [name] Achilles part of his heel does not have a completely viable surface but I did not debride this today Integumentary (Hair, Skin) Wound #1 status is Open. Original cause of wound was Gradually Appeared. The wound is located on the [name] Metatarsal head fifth. The wound measures 4cm length x 2.1cm width x 0.1cm depth; 6.597cm^2 area and 0.66cm^3 volume. There is no tunneling or undermining noted. There is a small amount of serosanguineous drainage noted. There is no granulation within the wound bed. There is a large (67-100%) amount of necrotic tissue within the wound bed including Eschar and Adherent Slough. Wound #2 status is Open. Original cause of wound was Gradually Appeared. The wound is located on the [name] Calcaneus. The wound measures 0.6cm length x 3cm width x 0.1cm depth; 1.414cm^2 area and 0.141cm^3 volume. There is no tunneling or  undermining noted. There is a medium amount of serosanguineous drainage noted. There is small (1-33%) granulation within the wound bed. There is a large (67-100%) amount of necrotic tissue within the wound bed including Eschar and Adherent Slough. Wound #3 status is Open. Original cause of wound was Gradually Appeared. The wound is located on the Left Metatarsal head fifth. The wound measures 4cm length x 2.5cm width x 0.1cm depth; 7.854cm^2 area and 0.785cm^3 volume. There is no tunneling or undermining noted. There is a none present amount of drainage noted. There is no granulation within the wound bed. There is a large (67-100%) amount of necrotic tissue within the wound bed including Eschar and Adherent Slough. Wound #4 status is Open. Original cause of wound was Pressure Injury. The wound is located on the Left,Lateral Lower Leg. The wound measures 2.5cm length x 1.6cm width x 0.1cm depth; 3.142cm^2 area and 0.314cm^3 volume. There is Fat Layer (Subcutaneous Tissue) exposed. There is no tunneling or undermining noted.  There is a none present amount of drainage noted. There is no granulation within the wound bed. There is a large (67-100%) amount of necrotic tissue within the wound bed including Eschar and Adherent Slough. 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 There was a Excisional Skin/Subcutaneous Tissue Debridement with a total area of 8.4 sq cm performed by Maxwell Caul., MD. With the following  instrument([name]): Curette to remove Viable and Non-Viable tissue/material. Material removed includes Eschar and Subcutaneous Tissue and after achieving pain control using Other (benzocaine, 20%). No specimens were taken. A time out was conducted at 14:00, prior to the start of the procedure. A Moderate amount of bleeding was controlled with Pressure. The procedure was tolerated well with a pain level of 0 throughout and a pain level of 0 following the procedure. Post Debridement Measurements: 4cm length x 2.1cm width x 0.1cm depth; 0.66cm^3 volume. Post debridement Stage noted as Unstageable/Unclassified. Character of Wound/Ulcer Post Debridement requires further debridement. Post procedure Diagnosis Wound #1: Same as Pre-Procedure Pre-procedure diagnosis of Wound #1 is a Pressure Ulcer located on the [name] Metatarsal head fifth . There was a Three Layer Compression Therapy Procedure by Zenaida Deed, RN. Post procedure Diagnosis Wound #1: Same as Pre-Procedure Wound #3 Pre-procedure diagnosis of Wound #3 is a Pressure Ulcer located on the Left Metatarsal head fifth . There was a Excisional Skin/Subcutaneous Tissue Debridement with a total area of 10 sq cm performed by Maxwell Caul., MD. With the following instrument([name]): Curette to remove Viable and Non-Viable tissue/material. Material removed includes Eschar and Subcutaneous Tissue and after achieving pain control using Other (benzocaine, 20%). No specimens were taken. A time out was conducted at 14:00, prior to the start of the procedure. A Moderate amount of bleeding was controlled with Pressure. The procedure was tolerated well with a pain level of 0 throughout and a pain level of 0 following the procedure. Post Debridement Measurements: 4cm length x 2.5cm width x 0.1cm depth; 0.785cm^3 volume. Post debridement Stage noted as Unstageable/Unclassified. Character of Wound/Ulcer Post Debridement requires further debridement. Post procedure  Diagnosis Wound #3: Same as Pre-Procedure Pre-procedure diagnosis of Wound #3 is a Pressure Ulcer located on the Left Metatarsal head fifth . There was a Three Layer Compression Therapy Procedure by Zenaida Deed, RN. Post procedure Diagnosis Wound #3: Same as Pre-Procedure Wound #4 Pre-procedure diagnosis of Wound #4 is a Pressure Ulcer located on the Left,Lateral Lower Leg . There was a Excisional Skin/Subcutaneous Tissue Debridement with a total area of 4 sq cm performed by Maxwell Caul., MD. With the following instrument([name]): Curette to remove Viable and Non-Viable tissue/material. Material removed includes Eschar and Subcutaneous Tissue and after achieving pain control using Other (benzocaine, 20%). No specimens were taken. A time out was conducted at 14:00, prior to the start of the procedure. A Moderate amount of bleeding was controlled with Pressure. The procedure was tolerated well with a pain level of 0 throughout and a pain level of 0 following the procedure. Post Debridement Measurements: 2.5cm length x 1.6cm width x 0.1cm depth;  0.314cm^3 volume. Post debridement Stage noted as Unstageable/Unclassified. Character of Wound/Ulcer Post Debridement requires further debridement. Post procedure Diagnosis Wound #4: Same as Pre-Procedure Pre-procedure diagnosis of Wound #4 is a Pressure Ulcer located on the Left,Lateral Lower Leg . There was a Three Layer Compression Therapy Procedure by Zenaida Deed, RN. Post procedure Diagnosis Wound #4: Same as Pre-Procedure Wound #2 Pre-procedure diagnosis of Wound #2 is a Pressure Ulcer located on the [name] Calcaneus . There was a Three Layer Compression Therapy Procedure by Zenaida Deed, RN. Post procedure Diagnosis Wound #2: Same as Pre-Procedure Plan 1. I am going to continue the Iodoflex. Choice of dressings is complicated by the fact he is on Medicaid. 2. Still under compression Electronic Signature([name]: 10/10/2019 7:39:18 AM  By: Baltazar Najjar MD Signed: 10/10/2019 7:39:18 AM By: Baltazar Najjar MD Entered By: Baltazar Najjar on 10/08/2019 14:27:31 -------------------------------------------------------------------------------- SuperBill Details Patient Name: Date of Service: [name], Cleon Dew RLO [name] 10/08/2019 Medical Record Number: [id number] Patient Account Number: [id number] Date of Birth/Sex: Treating RN: [birth year] (54 y.o. 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 L97.528 Non-pressure chronic ulcer of other part of left foot with other specified severity L89.613 Pressure ulcer of [name] heel, stage 3 L89.623 Pressure ulcer of left heel, stage 3 E11.621 Type 2 diabetes mellitus with foot ulcer L97.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 G82.21 Paraplegia, complete Facility Procedures CPT4 Code: [id number] Description: 11042 - DEB SUBQ TISSUE 20 SQ CM/< ICD-10 Diagnosis Description 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 with other specified sev L97.528 Non-pressure chronic ulcer of other part of left foot with other specified seve  L97.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 Modifier: erity rity Quantity: 1 CPT4 Code: [id number] Description: 11045 - DEB SUBQ TISS EA ADDL 20CM ICD-10 Diagnosis Description 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 with other specified sev L97.528 Non-pressure chronic ulcer of other part of left foot with other specified seve  L97.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 Modifier: erity rity Quantity: 1 Physician Procedures : CPT4 Code Description Modifier  [id number] 11042 - WC PHYS SUBQ TISS 20 SQ CM ICD-10 Diagnosis Description 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 L97.528 Non-pressure chronic ulcer of other part of left  foot with other specified severity L97.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

## 2019-10-10 NOTE — Progress Notes (Signed)
OVAL, CAVAZOS ([id number]) Visit Report for 10/08/2019 Arrival Information Details Patient Name: Date of Service: [name], [place] 10/08/2019 1:00 PM Medical Record Number: [id number] Patient Account Number: [id number] Date of Birth/Sex: Treating RN: [birth year] (54 y.o. [name]) Yevonne Pax Primary Care Toni Demo: [name] Other Clinician: Referring Brodyn Depuy: Treating Wilho Sharpley/Extender: [name] in Treatment: 1 Visit Information History Since Last Visit All ordered tests and consults were completed: No Patient Arrived: Wheel Chair Added or deleted any medications: No Arrival Time: 13:25 Any new allergies or adverse reactions: No Accompanied By: wife Had a fall or experienced change in No Transfer Assistance: None activities of daily living that may affect Patient Identification Verified: Yes risk of falls: Secondary Verification Process Completed: Yes Signs or symptoms of abuse/neglect since last visito No Patient Requires Transmission-Based Precautions: No Hospitalized since last visit: No Patient Has Alerts: Yes Implantable device outside of the clinic excluding No Patient Alerts: Translator Required cellular tissue based products placed in the center [name] ABI 1.61 since last visit: Left ABI 1.36 Has Dressing in Place as Prescribed: Yes Has Compression in Place as Prescribed: Yes Pain Present Now: No Electronic Signature(s) Signed: 10/08/2019 5:49:17 PM By: Yevonne Pax RN Entered By: Yevonne Pax on 10/08/2019 13:26:15 -------------------------------------------------------------------------------- Compression Therapy Details Patient Name: Date of Service: [name], [name] RLO S 10/08/2019 1:00 PM Medical Record Number: [id number] Patient Account Number: [id number] Date of Birth/Sex: Treating RN: 01-Mar-1965 (54 y.o. [name] Primary Care Grayton Lobo: [name] Other Clinician: Referring Ezzie Senat: Treating  Donis Kotowski/Extender: [name] in Treatment: 1 Compression Therapy Performed for Wound Assessment: Wound #1 [name] Metatarsal head fifth Performed By: Clinician Zenaida Deed, RN Compression Type: Three Layer Post Procedure Diagnosis Same as Pre-procedure Electronic Signature(s) Signed: 10/08/2019 5:56:33 PM By: Cherylin Mylar Entered By: Cherylin Mylar on 10/08/2019 13:58:41 -------------------------------------------------------------------------------- Compression Therapy Details Patient Name: Date of Service: [name], [name] 10/08/2019 1:00 PM Medical Record Number: [id number] Patient Account Number: [id number] Date of Birth/Sex: Treating RN: 1965/05/02 (54 y.o. [name] Primary Care Jesstin [name]: [name] Other Clinician: Referring Akiva Brassfield: Treating Majestic Brister/Extender: [name] in Treatment: 1 Compression Therapy Performed for Wound Assessment: Wound #2 [name] Calcaneus Performed By: Clinician Zenaida Deed, RN Compression Type: Three Layer Post Procedure Diagnosis Same as Pre-procedure Electronic Signature(s) Signed: 10/08/2019 5:56:33 PM By: Cherylin Mylar Entered By: Cherylin Mylar on 10/08/2019 14:00:27 -------------------------------------------------------------------------------- Compression Therapy Details Patient Name: Date of Service: [name], [name] 10/08/2019 1:00 PM Medical Record Number: [id number] Patient Account Number: [id number] Date of Birth/Sex: Treating RN: 05/07/1965 (54 y.o. [name] Primary Care [name]: [name] Other Clinician: Referring Jaine Estabrooks: Treating Sylva Overley/Extender: [name] in Treatment: 1 Compression Therapy Performed for Wound Assessment: Wound #3 Left Metatarsal head fifth Performed By: Clinician Zenaida Deed, RN Compression Type: Three Layer Post Procedure Diagnosis Same as  Pre-procedure Electronic Signature(s) Signed: 10/08/2019 5:56:33 PM By: Cherylin Mylar Entered By: Cherylin Mylar on 10/08/2019 14:00:27 -------------------------------------------------------------------------------- Compression Therapy Details Patient Name: Date of Service: [name], [name] 10/08/2019 1:00 PM Medical Record Number: [id number] Patient Account Number: [id number] Date of Birth/Sex: Treating RN: [birth year] (54 y.o. [name] Primary Care Grayland Daisey: [name] Other Clinician: Referring Adahlia Stembridge: Treating [name]/Extender: [name] in Treatment: 1 Compression Therapy Performed for Wound Assessment: Wound #4 Left,Lateral Lower Leg Performed By: Clinician Zenaida Deed, RN Compression Type: Three Layer Post Procedure Diagnosis Same as Pre-procedure Electronic Signature(s) Signed: 10/08/2019 5:56:33 PM  By: Cherylin Mylar Entered By: Cherylin Mylar on 10/08/2019 14:00:27 -------------------------------------------------------------------------------- Encounter Discharge Information Details Patient Name: Date of Service: [name], [name] 10/08/2019 1:00 PM Medical Record Number: [id number] Patient Account Number: [id number] Date of Birth/Sex: Treating RN: 31-Aug-1965 (54 y.o. [name] Primary Care Bensen Chadderdon: [name] Other Clinician: Referring Kym Scannell: Treating Jaymason Ledesma/Extender: [name] in Treatment: 1 Encounter Discharge Information Items Post Procedure Vitals Discharge Condition: Stable Temperature (F): 97.9 Ambulatory Status: Wheelchair Pulse (bpm): 66 Discharge Destination: Home Respiratory Rate (breaths/min): 18 Transportation: Private Auto Blood Pressure (mmHg): 103/68 Accompanied By: wife and interpreter Schedule Follow-up Appointment: Yes Clinical Summary of Care: Patient Declined Electronic Signature(s) Signed: 10/08/2019 6:01:16 PM By:  Zandra Abts RN, BSN Entered By: Zandra Abts on 10/08/2019 16:36:15 -------------------------------------------------------------------------------- Multi Wound Chart Details Patient Name: Date of Service: [name], [name] 10/08/2019 1:00 PM Medical Record Number: [id number] Patient Account Number: [id number] Date of Birth/Sex: Treating RN: 01-09-1966 (54 y.o. [name] Primary Care Arianis Bowditch: [name] Other Clinician: Referring Jayveon Convey: Treating Navaya Wiatrek/Extender: [name] in Treatment: 1 Vital Signs Height(in): 68 Pulse(bpm): 66 Weight(lbs): 235 Blood Pressure(mmHg): 103/68 Body Mass Index(BMI): 36 Temperature(F): 97.9 Respiratory Rate(breaths/min): 18 Photos: [1:No Photos [name] Metatarsal head fifth] [2:No Photos [name] Calcaneus] [3:No Photos Left Metatarsal head fifth] Wound Location: [1:Gradually Appeared] [2:Gradually Appeared] [3:Gradually Appeared] Wounding Event: [1:Pressure Ulcer] [2:Pressure Ulcer] [3:Pressure Ulcer] Primary Etiology: [1:Hypotension, Type II Diabetes] [2:Hypotension, Type II Diabetes] [3:Hypotension, Type II Diabetes] Comorbid History: [1:08/12/2019] [2:08/12/2019] [3:08/12/2019] Date Acquired: [1:1] [2:1] [3:1] Weeks of Treatment: [1:Open] [2:Open] [3:Open] Wound Status: [1:4x2.1x0.1] [2:0.6x3x0.1] [3:4x2.5x0.1] Measurements L x W x D (cm) [1:6.597] [2:1.414] [3:7.854] A (cm) : rea [1:0.66] [2:0.141] [3:0.785] Volume (cm) : [1:-54.40%] [2:10.00%] [3:-38.90%] % Reduction in A rea: [1:-54.60%] [2:55.10%] [3:-38.90%] % Reduction in Volume: [1:Unstageable/Unclassified] [2:Unstageable/Unclassified] [3:Unstageable/Unclassified] Classification: [1:Small] [2:Medium] [3:None Present] Exudate A mount: [1:Serosanguineous] [2:Serosanguineous] [3:N/A] Exudate Type: [1:red, brown] [2:red, brown] [3:N/A] Exudate Color: [1:None Present (0%)] [2:Small (1-33%)] [3:None Present (0%)] Granulation A mount:  [1:Large (67-100%)] [2:Large (67-100%)] [3:Large (67-100%)] Necrotic A mount: [1:Eschar, Adherent Slough] [2:Eschar, Adherent Slough] [3:Eschar, Adherent Slough] Necrotic Tissue: [1:Fascia: No] [2:Fascia: No] [3:Fascia: No] Exposed Structures: [1:Fat Layer (Subcutaneous Tissue): No Tendon: No Muscle: No Joint: No Bone: No None] [2:Fat Layer (Subcutaneous Tissue): No Tendon: No Muscle: No Joint: No Bone: No None] [3:Fat Layer (Subcutaneous Tissue): No Tendon: No Muscle: No Joint: No Bone: No  None] Epithelialization: [1:Debridement - Excisional] [2:N/A] [3:Debridement - Excisional] Debridement: Pre-procedure Verification/Time Out 14:00 [2:N/A] [3:14:00] Taken: [1:Other] [2:N/A] [3:Other] Pain Control: [1:Necrotic/Eschar, Subcutaneous] [2:N/A] [3:Necrotic/Eschar, Subcutaneous] Tissue Debrided: [1:Skin/Subcutaneous Tissue] [2:N/A] [3:Skin/Subcutaneous Tissue] Level: [1:8.4] [2:N/A] [3:10] Debridement A (sq cm): [1:rea Curette] [2:N/A] [3:Curette] Instrument: [1:Moderate] [2:N/A] [3:Moderate] Bleeding: [1:Pressure] [2:N/A] [3:Pressure] Hemostasis A chieved: [1:0] [2:N/A] [3:0] Procedural Pain: [1:0] [2:N/A] [3:0] Post Procedural Pain: [1:Procedure was tolerated well] [2:N/A] [3:Procedure was tolerated well] Debridement Treatment Response: [1:4x2.1x0.1] [2:N/A] [3:4x2.5x0.1] Post Debridement Measurements L x W x D (cm) [1:0.66] [2:N/A] [3:0.785] Post Debridement Volume: (cm) [1:Unstageable/Unclassified] [2:N/A] [3:Unstageable/Unclassified] Post Debridement Stage: [1:Compression Therapy] [2:Compression Therapy] [3:Compression Therapy] Procedures Performed: [1:Debridement 4] [2:N/A] [3:Debridement N/A] Photos: [1:No Photos Left, Lateral Lower Leg] [2:N/A N/A] [3:N/A N/A] Wound Location: [1:Pressure Injury] [2:N/A] [3:N/A] Wounding Event: [1:Pressure Ulcer] [2:N/A] [3:N/A] Primary Etiology: [1:Hypotension, Type II Diabetes] [2:N/A] [3:N/A] Comorbid History: [1:08/12/2019] [2:N/A] [3:N/A] Date  Acquired: [1:1] [2:N/A] [3:N/A] Weeks of Treatment: [1:Open] [2:N/A] [3:N/A] Wound Status: [1:2.5x1.6x0.1] [2:N/A] [3:N/A] Measurements L x W x D (cm) [1:3.142] [2:N/A] [3:N/A] A (cm) :  rea [1:0.314] [2:N/A] [3:N/A] Volume (cm) : [1:-6.70%] [2:N/A] [3:N/A] % Reduction in A rea: [1:-6.40%] [2:N/A] [3:N/A] % Reduction in Volume: [1:Unstageable/Unclassified] [2:N/A] [3:N/A] Classification: [1:None Present] [2:N/A] [3:N/A] Exudate A mount: [1:N/A] [2:N/A] [3:N/A] Exudate Type: [1:N/A] [2:N/A] [3:N/A] Exudate Color: [1:None Present (0%)] [2:N/A] [3:N/A] Granulation A mount: [1:Large (67-100%)] [2:N/A] [3:N/A] Necrotic A mount: [1:Eschar, Adherent Slough] [2:N/A] [3:N/A] Necrotic Tissue: [1:Fat Layer (Subcutaneous Tissue): Yes N/A] [3:N/A] Exposed Structures: [1:Fascia: No Tendon: No Muscle: No Joint: No Bone: No None] [2:N/A] [3:N/A] Epithelialization: [1:Debridement - Excisional] [2:N/A] [3:N/A] Debridement: Pre-procedure Verification/Time Out 14:00 [2:N/A] [3:N/A] Taken: [1:Other] [2:N/A] [3:N/A] Pain Control: [1:Necrotic/Eschar, Subcutaneous] [2:N/A] [3:N/A] Tissue Debrided: [1:Skin/Subcutaneous Tissue] [2:N/A] [3:N/A] Level: [1:4] [2:N/A] [3:N/A] Debridement A (sq cm): [1:rea Curette] [2:N/A] [3:N/A] Instrument: [1:Moderate] [2:N/A] [3:N/A] Bleeding: [1:Pressure] [2:N/A] [3:N/A] Hemostasis A chieved: [1:0] [2:N/A] [3:N/A] Procedural Pain: [1:0] [2:N/A] [3:N/A] Post Procedural Pain: [1:Procedure was tolerated well] [2:N/A] [3:N/A] Debridement Treatment Response: [1:2.5x1.6x0.1] [2:N/A] [3:N/A] Post Debridement Measurements L x W x D (cm) [1:0.314] [2:N/A] [3:N/A] Post Debridement Volume: (cm) [1:Unstageable/Unclassified] [2:N/A] [3:N/A] Post Debridement Stage: [1:Compression Therapy] [2:N/A] [3:N/A] Procedures Performed: [1:Debridement] Treatment Notes Electronic Signature(s) Signed: 10/08/2019 5:56:33 PM By: Cherylin Mylar Signed: 10/10/2019 7:39:18 AM By: Baltazar Najjar  MD Entered By: Baltazar Najjar on 10/08/2019 14:23:55 -------------------------------------------------------------------------------- Multi-Disciplinary Care Plan Details Patient Name: Date of Service: [name], [name] 10/08/2019 1:00 PM Medical Record Number: [id number] Patient Account Number: [id number] Date of Birth/Sex: Treating RN: 05/02/1965 (53 y.o. [name] Primary Care Lashae Wollenberg: [name] Other Clinician: Referring Kennidee Heyne: Treating Latisa Belay/Extender: [name] in Treatment: 1 Active Inactive Nutrition Nursing Diagnoses: Impaired glucose control: actual or potential Goals: Patient/caregiver verbalizes understanding of need to maintain therapeutic glucose control per primary care physician Date Initiated: 10/01/2019 Target Resolution Date: 11/05/2019 Goal Status: Active Interventions: Provide education on elevated blood sugars and impact on wound healing Notes: Pressure Nursing Diagnoses: Knowledge deficit related to causes and risk factors for pressure ulcer development Goals: Patient/caregiver will verbalize risk factors for pressure ulcer development Date Initiated: 10/01/2019 Target Resolution Date: 11/05/2019 Goal Status: Active Interventions: Provide education on pressure ulcers Notes: Wound/Skin Impairment Nursing Diagnoses: Impaired tissue integrity Goals: Ulcer/skin breakdown will have a volume reduction of 50% by week 8 Date Initiated: 10/01/2019 Target Resolution Date: 11/05/2019 Goal Status: Active Interventions: Provide education on ulcer and skin care Notes: Electronic Signature(s) Signed: 10/08/2019 5:56:33 PM By: Cherylin Mylar Entered By: Cherylin Mylar on 10/08/2019 13:13:03 -------------------------------------------------------------------------------- Pain Assessment Details Patient Name: Date of Service: [name], [name] RLO S 10/08/2019 1:00 PM Medical Record Number:  [id number] Patient Account Number: [id number] Date of Birth/Sex: Treating RN: 1965-10-11 (53 y.o. [name] Primary Care Donna Snooks: [name] Other Clinician: Referring Amel Gianino: Treating Aneira Cavitt/Extender: [name] in Treatment: 1 Active Problems Location of Pain Severity and Description of Pain Patient Has Paino No Site Locations Pain Management and Medication Current Pain Management: Electronic Signature(s) Signed: 10/08/2019 5:49:17 PM By: Yevonne Pax RN Entered By: Yevonne Pax on 10/08/2019 13:27:10 -------------------------------------------------------------------------------- Patient/Caregiver Education Details Patient Name: Date of Service: [name], [name] RLO S 8/27/2021andnbsp1:00 PM Medical Record Number: [id number] Patient Account Number: [id number] Date of Birth/Gender: Treating RN: Dec 19, 1965 (54 y.o. [name] Primary Care Physician: [name] Other Clinician: Referring Physician: Treating Physician/Extender: [name] in Treatment: 1 Education Assessment Education Provided To: Patient Education Topics Provided Elevated Blood Sugar/ Impact on Healing: Handouts: Elevated Blood Sugars: How Do They Affect Wound Healing Methods: Explain/Verbal Responses: State content  correctly Pressure: Methods: Explain/Verbal Responses: State content correctly Wound/Skin Impairment: Handouts: Caring for Your Ulcer Methods: Explain/Verbal Responses: State content correctly Electronic Signature(s) Signed: 10/08/2019 5:56:33 PM By: Cherylin Mylarwiggins, Shannon Entered By: Cherylin Mylarwiggins, Shannon on 10/08/2019 13:13:25 -------------------------------------------------------------------------------- Wound Assessment Details Patient Name: Date of Service: Harry MoroFA CUNDO, [name] 10/08/2019 1:00 PM Medical Record Number: [id number] Patient Account Number: [id number] Date of Birth/Sex: Treating  RN: [birth year] (53 y.o. Harry PetitM) Yevonne PaxEpps, Carrie Primary Care Blaiden Werth: Gwinda PasseEdwards, Michelle Other Clinician: Referring Toivo Bordon: Treating Wonder Donaway/Extender: Aurelio Brashobson, Michael Edwards, Michelle Weeks in Treatment: 1 Wound Status Wound Number: 1 Primary Etiology: Pressure Ulcer Wound Location: [name] Metatarsal head fifth Wound Status: Open Wounding Event: Gradually Appeared Comorbid History: Hypotension, Type II Diabetes Date Acquired: 08/12/2019 Weeks Of Treatment: 1 Clustered Wound: No Wound Measurements Length: (cm) 4 Width: (cm) 2.1 Depth: (cm) 0.1 Area: (cm) 6.597 Volume: (cm) 0.66 % Reduction in Area: -54.4% % Reduction in Volume: -54.6% Epithelialization: None Tunneling: No Undermining: No Wound Description Classification: Unstageable/Unclassified Exudate Amount: Small Exudate Type: Serosanguineous Exudate Color: red, brown Foul Odor After Cleansing: No Slough/Fibrino Yes Wound Bed Granulation Amount: None Present (0%) Exposed Structure Necrotic Amount: Large (67-100%) Fascia Exposed: No Necrotic Quality: Eschar, Adherent Slough Fat Layer (Subcutaneous Tissue) Exposed: No Tendon Exposed: No Muscle Exposed: No Joint Exposed: No Bone Exposed: No Treatment Notes Wound #1 ([name] Metatarsal head fifth) 1. Cleanse With Soap and water 3. Primary Dressing Applied Iodoflex 4. Secondary Dressing ABD Pad Dry Gauze Kerramax/Xtrasorb 6. Support Layer Applied 3 layer compression wrap Electronic Signature(s) Signed: 10/08/2019 5:49:17 PM By: Yevonne PaxEpps, Carrie RN Entered By: Yevonne PaxEpps, Carrie on 10/08/2019 13:28:29 -------------------------------------------------------------------------------- Wound Assessment Details Patient Name: Date of Service: Harry MoroFA [place], [name] 10/08/2019 1:00 PM Medical Record Number: [id number] Patient Account Number: [id number] Date of Birth/Sex: Treating RN: [birth year] (53 y.o. Harry PetitM) Yevonne PaxEpps, Carrie Primary Care Caeden Foots: Gwinda PasseEdwards, Michelle Other  Clinician: Referring Arnie Maiolo: Treating Esterlene Atiyeh/Extender: Aurelio Brashobson, Michael Edwards, Michelle Weeks in Treatment: 1 Wound Status Wound Number: 2 Primary Etiology: Pressure Ulcer Wound Location: [name] Calcaneus Wound Status: Open Wounding Event: Gradually Appeared Comorbid History: Hypotension, Type II Diabetes Date Acquired: 08/12/2019 Weeks Of Treatment: 1 Clustered Wound: No Wound Measurements Length: (cm) 0.6 Width: (cm) 3 Depth: (cm) 0.1 Area: (cm) 1.414 Volume: (cm) 0.141 % Reduction in Area: 10% % Reduction in Volume: 55.1% Epithelialization: None Tunneling: No Undermining: No Wound Description Classification: Unstageable/Unclassified Exudate Amount: Medium Exudate Type: Serosanguineous Exudate Color: red, brown Foul Odor After Cleansing: No Slough/Fibrino Yes Wound Bed Granulation Amount: Small (1-33%) Exposed Structure Necrotic Amount: Large (67-100%) Fascia Exposed: No Necrotic Quality: Eschar, Adherent Slough Fat Layer (Subcutaneous Tissue) Exposed: No Tendon Exposed: No Muscle Exposed: No Joint Exposed: No Bone Exposed: No Treatment Notes Wound #2 ([name] Calcaneus) 1. Cleanse With Soap and water 3. Primary Dressing Applied Iodoflex 4. Secondary Dressing ABD Pad Dry Gauze Kerramax/Xtrasorb 6. Support Layer Applied 3 layer compression Cytogeneticistwrap Electronic Signature(s) Signed: 10/08/2019 5:49:17 PM By: Yevonne PaxEpps, Carrie RN Entered By: Yevonne PaxEpps, Carrie on 10/08/2019 13:30:26 -------------------------------------------------------------------------------- Wound Assessment Details Patient Name: Date of Service: [name] [place], [name] 10/08/2019 1:00 PM Medical Record Number: [id number] Patient Account Number: [id number] Date of Birth/Sex: Treating RN: [birth year] (53 y.o. Harry PetitM) Yevonne PaxEpps, Carrie Primary Care Meenakshi Sazama: Gwinda PasseEdwards, Michelle Other Clinician: Referring Zai Chmiel: Treating Sandeep Radell/Extender: Aurelio Brashobson, Michael Edwards, Michelle Weeks in Treatment: 1 Wound  Status Wound Number: 3 Primary Etiology: Pressure Ulcer Wound Location: Left Metatarsal head fifth Wound Status: Open Wounding Event: Gradually Appeared Comorbid History: Hypotension, Type II Diabetes Date Acquired: 08/12/2019 Weeks Of Treatment:  1 Clustered Wound: No Wound Measurements Length: (cm) 4 Width: (cm) 2.5 Depth: (cm) 0.1 Area: (cm) 7.854 Volume: (cm) 0.785 % Reduction in Area: -38.9% % Reduction in Volume: -38.9% Epithelialization: None Tunneling: No Undermining: No Wound Description Classification: Unstageable/Unclassified Exudate Amount: None Present Foul Odor After Cleansing: No Slough/Fibrino Yes Wound Bed Granulation Amount: None Present (0%) Exposed Structure Necrotic Amount: Large (67-100%) Fascia Exposed: No Necrotic Quality: Eschar, Adherent Slough Fat Layer (Subcutaneous Tissue) Exposed: No Tendon Exposed: No Muscle Exposed: No Joint Exposed: No Bone Exposed: No Treatment Notes Wound #3 (Left Metatarsal head fifth) 1. Cleanse With Soap and water 3. Primary Dressing Applied Iodoflex 4. Secondary Dressing ABD Pad Dry Gauze Kerramax/Xtrasorb 6. Support Layer Applied 3 layer compression wrap Electronic Signature(s) Signed: 10/08/2019 5:49:17 PM By: Yevonne Pax RN Entered By: Yevonne Pax on 10/08/2019 13:29:09 -------------------------------------------------------------------------------- Wound Assessment Details Patient Name: Date of Service: [name], Sherlyn Lees 10/08/2019 1:00 PM Medical Record Number: [id number] Patient Account Number: [id number] Date of Birth/Sex: Treating RN: 04/14/1965 (53 y.o. [name]) Yevonne Pax Primary Care Shritha Bresee: [name] Other Clinician: Referring Shrita Thien: Treating Isis Costanza/Extender: [name] in Treatment: 1 Wound Status Wound Number: 4 Primary Etiology: Pressure Ulcer Wound Location: Left, Lateral Lower Leg Wound Status: Open Wounding Event: Pressure  Injury Comorbid History: Hypotension, Type II Diabetes Date Acquired: 08/12/2019 Weeks Of Treatment: 1 Clustered Wound: No Wound Measurements Length: (cm) 2.5 Width: (cm) 1.6 Depth: (cm) 0.1 Area: (cm) 3.142 Volume: (cm) 0.314 % Reduction in Area: -6.7% % Reduction in Volume: -6.4% Epithelialization: None Tunneling: No Undermining: No Wound Description Classification: Unstageable/Unclassified Exudate Amount: None Present Foul Odor After Cleansing: No Slough/Fibrino Yes Wound Bed Granulation Amount: None Present (0%) Exposed Structure Necrotic Amount: Large (67-100%) Fascia Exposed: No Necrotic Quality: Eschar, Adherent Slough Fat Layer (Subcutaneous Tissue) Exposed: Yes Tendon Exposed: No Muscle Exposed: No Joint Exposed: No Bone Exposed: No Treatment Notes Wound #4 (Left, Lateral Lower Leg) 1. Cleanse With Soap and water 3. Primary Dressing Applied Iodoflex 4. Secondary Dressing ABD Pad Dry Gauze Kerramax/Xtrasorb 6. Support Layer Applied 3 layer compression Cytogeneticist) Signed: 10/08/2019 5:49:17 PM By: Yevonne Pax RN Entered By: Yevonne Pax on 10/08/2019 13:29:29 -------------------------------------------------------------------------------- Vitals Details Patient Name: Date of Service: [name], [name] 10/08/2019 1:00 PM Medical Record Number: [id number] Patient Account Number: [id number] Date of Birth/Sex: Treating RN: Nov 21, 1965 (53 y.o. [name]) Yevonne Pax Primary Care Svetlana Bagby: [name] Other Clinician: Referring Demetrios Byron: Treating Karrah Mangini/Extender: [name] in Treatment: 1 Vital Signs Time Taken: 13:26 Temperature (F): 97.9 Height (in): 68 Pulse (bpm): 66 Weight (lbs): 235 Respiratory Rate (breaths/min): 18 Body Mass Index (BMI): 35.7 Blood Pressure (mmHg): 103/68 Reference Range: 80 - 120 mg / dl Electronic Signature(s) Signed: 10/08/2019 5:49:17 PM By: Yevonne Pax RN Entered  By: Yevonne Pax on 10/08/2019 13:26:56

## 2019-10-13 ENCOUNTER — Encounter
Payer: Medicaid Other | Attending: Physical Medicine and Rehabilitation | Admitting: Physical Medicine and Rehabilitation

## 2019-10-13 ENCOUNTER — Other Ambulatory Visit: Payer: Self-pay

## 2019-10-13 ENCOUNTER — Encounter: Payer: Self-pay | Admitting: Physical Medicine and Rehabilitation

## 2019-10-13 VITALS — BP 119/78 | HR 56 | Temp 97.6°F

## 2019-10-13 DIAGNOSIS — N521 Erectile dysfunction due to diseases classified elsewhere: Secondary | ICD-10-CM

## 2019-10-13 DIAGNOSIS — L03119 Cellulitis of unspecified part of limb: Secondary | ICD-10-CM

## 2019-10-13 DIAGNOSIS — M7918 Myalgia, other site: Secondary | ICD-10-CM | POA: Diagnosis present

## 2019-10-13 DIAGNOSIS — G8250 Quadriplegia, unspecified: Secondary | ICD-10-CM | POA: Diagnosis present

## 2019-10-13 DIAGNOSIS — R252 Cramp and spasm: Secondary | ICD-10-CM | POA: Diagnosis not present

## 2019-10-13 DIAGNOSIS — K592 Neurogenic bowel, not elsewhere classified: Secondary | ICD-10-CM | POA: Insufficient documentation

## 2019-10-13 DIAGNOSIS — S24101A Unspecified injury at T1 level of thoracic spinal cord, initial encounter: Secondary | ICD-10-CM | POA: Diagnosis present

## 2019-10-13 DIAGNOSIS — N319 Neuromuscular dysfunction of bladder, unspecified: Secondary | ICD-10-CM | POA: Insufficient documentation

## 2019-10-13 DIAGNOSIS — Z95828 Presence of other vascular implants and grafts: Secondary | ICD-10-CM | POA: Diagnosis present

## 2019-10-13 MED ORDER — SILDENAFIL CITRATE 100 MG PO TABS: 100.0000 mg | ORAL_TABLET | Freq: Every day | ORAL | 5 refills | Status: DC | PRN

## 2019-10-13 MED ORDER — GABAPENTIN 600 MG PO TABS
600.0000 mg | ORAL_TABLET | Freq: Three times a day (TID) | ORAL | 5 refills | Status: DC
Start: 2019-10-13 — End: 2020-07-28

## 2019-10-13 MED ORDER — BACLOFEN 10 MG PO TABS
10.0000 mg | ORAL_TABLET | Freq: Three times a day (TID) | ORAL | 5 refills | Status: DC
Start: 2019-10-13 — End: 2020-07-18

## 2019-10-13 NOTE — Patient Instructions (Signed)
Plan: 1. Restart Gabapentin 400 mg 3x/day-  X 2 week, then 600 mg 3x/day- has Rx for 400 mg- will send in Rx for 600 mg 3x/day- 5 refills.   2. Increase baclofen to 10 mg 3x/day- sent in for increased muscle tightness in legs  3. Patient here for trigger point injections for myofascial pain  Consent done and on chart.  Cleaned areas with alcohol and injected using a 27 gauge 1.5 inch needle  Injected 6cc Using 1% Lidocaine with no EPI  Upper traps B/L Levators b/L Posterior scalenes B/L Middle scalenes Splenius Capitus B/L Pectoralis Major b/L Rhomboids B/L x2 Infraspinatus Teres Major/minor B/L Thoracic paraspinals Lumbar paraspinals Other injections-    Patient's level of pain prior was 6/10 Current level of pain after injections is  4/10  There was no bleeding or complications.  Patient was advised to drink a lot of water on day after injections to flush system Will have increased soreness for 12-48 hours after injections.  Can use Lidocaine patches the day AFTER injections Can use theracane on day of injections in places didn't inject Can use heating pad or ice 4-6 hours AFTER injections  4. Seeing a physician/nurse for leg wound care.   5. Referrals for outpatient PT and OT- per therapy request- since was in hospital, needs new orders.   6. Refill Viagra 10 mg #12 as needed- 5 refills  7. F/U in 6 weeks

## 2019-10-13 NOTE — Progress Notes (Signed)
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   Was in the hospital for wounds on his feet.  Better now-was in hospital for cellulitis of feet ulcers.   Back is very tired- hurts/painful.   Neck and his back are hurting.  Feels like back is really "big".   .Hospital lowered the dose of Baclofen and got rid of Gabapentin-  600 mg TID  Sees someone for LE wound care   Plan: 1. Restart Gabapentin 400 mg 3x/day-  X 2 week, then 600 mg 3x/day- has Rx for 400 mg- will send in Rx for 600 mg 3x/day- 5 refills.   2. Increase baclofen to 10 mg 3x/day- sent in for increased muscle tightness in legs  3. Patient here for trigger point injections for myofascial pain  Consent done and on chart.  Cleaned areas with alcohol and injected using a 27 gauge 1.5 inch needle  Injected 6cc Using 1% Lidocaine with no EPI  Upper traps B/L Levators b/L Posterior scalenes B/L Middle scalenes Splenius Capitus B/L Pectoralis Major b/L Rhomboids B/L x2 Infraspinatus Teres Major/minor B/L Thoracic paraspinals Lumbar paraspinals Other injections-    Patient's level of pain prior was 6/10 Current level of pain after injections is  4/10  There was no bleeding or complications.  Patient was advised to drink a lot of water on day after injections to flush system Will have increased soreness for 12-48 hours after injections.  Can use Lidocaine patches the day AFTER injections Can use theracane on day of injections in places didn't inject Can use heating pad or ice 4-6 hours AFTER injections  4. Seeing a physician/nurse for leg wound care.   5. Referrals for outpatient PT and OT- per therapy request- since was in hospital, needs new orders.   6. Refill Viagra 10 mg #12 as needed- 5 refills  7. F/U in 6 weeks.    I spent a total of 45 minutes on visit- as detailed above.

## 2019-10-14 ENCOUNTER — Encounter (HOSPITAL_BASED_OUTPATIENT_CLINIC_OR_DEPARTMENT_OTHER): Payer: Medicaid Other | Attending: Internal Medicine | Admitting: Internal Medicine

## 2019-10-14 DIAGNOSIS — L89613 Pressure ulcer of right heel, stage 3: Secondary | ICD-10-CM | POA: Diagnosis not present

## 2019-10-14 DIAGNOSIS — L97528 Non-pressure chronic ulcer of other part of left foot with other specified severity: Secondary | ICD-10-CM | POA: Diagnosis not present

## 2019-10-14 DIAGNOSIS — L89623 Pressure ulcer of left heel, stage 3: Secondary | ICD-10-CM | POA: Diagnosis not present

## 2019-10-14 DIAGNOSIS — L97518 Non-pressure chronic ulcer of other part of right foot with other specified severity: Secondary | ICD-10-CM | POA: Diagnosis not present

## 2019-10-14 DIAGNOSIS — L97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: Secondary | ICD-10-CM | POA: Diagnosis not present

## 2019-10-14 DIAGNOSIS — Z7901 Long term (current) use of anticoagulants: Secondary | ICD-10-CM | POA: Diagnosis not present

## 2019-10-14 DIAGNOSIS — G8221 Paraplegia, complete: Secondary | ICD-10-CM | POA: Insufficient documentation

## 2019-10-14 DIAGNOSIS — E11621 Type 2 diabetes mellitus with foot ulcer: Secondary | ICD-10-CM | POA: Insufficient documentation

## 2019-10-14 NOTE — Progress Notes (Signed)
[name] ([id number]) Visit Report for 10/14/2019 HPI Details Patient Name: Date of Service: [name] 10/14/2019 9:45 A M Medical Record Number: [id number] Patient Account Number: [id number] Date of Birth/Sex: Treating RN: 08-12-1965 (54 y.o. [name] Primary Care Provider: [name] Other Clinician: Referring Provider: Treating Provider/Extender: [name] in Treatment: 1 History of Present Illness HPI Description: ADMISSION 10/02/2018 This is a 54 year old Spanish-speaking man who arrived accompanied by his wife. 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 Apparently 6 weeks ago his wife noted blisters on his bilateral fifth metatarsal heads and heels which she feels was from excessive pressure on the foot rests of his wheelchair. He was seen in the emergency room early in August and then was admitted to hospital from 8/10 through 8/14 with cellulitis of his feet. He was ultimately discharged on Keflex. Arterial studies were done in the hospital that showed an ABI of 1.61 on the right and 1.36 on the left but triphasic waveforms on the left and biphasic on the right. DVT rule out study was negative. X-ray of the bilateral feet did not show osteomyelitis. They have been dealing with these wounds at home. I think they are using Betadine. He has Medicaid and does not have home health. He comes in today with wounds on his bilateral plantar fifth metatarsal heads Achilles area of both heels and an area on the left lateral leg. His wife explains this as he always laterally rotates his legs when he is sitting in the wheelchair or in bed. She even seemed to believe that before his injury he actually walked on the outside of his feet. 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 Paraplegia  T1-T6 8/27; the patient has 4 open wounds mirror-image areas over the plantar fifth metatarsal heads. Area on the right Achilles area and then on the left lateral calf. We have been using Iodoflex. He is on Eliquis 9/2 debridement I did last week caused copious amounts of bleeding which was difficult to control. He is on Eliquis. He has the 2 mirror-image wounds over the plantar fifth metatarsal heads in the area on the right Achilles and then an area on the left lateral calf. Both the fifth metatarsal head wounds on the left lateral calf of necrotic eschar on the surface. Black discoloration likely partially the silver nitrate we had to use last time Electronic Signature(s) Signed: 10/14/2019 5:13:24 PM By: Baltazar Najjar MD Entered By: Baltazar Najjar on 10/14/2019 10:44:18 -------------------------------------------------------------------------------- Physical Exam Details Patient Name: Date of Service: [name], [name] 10/14/2019 9:45 A M Medical Record Number: [id number] Patient Account Number: [id number] Date of Birth/Sex: Treating RN: [birth year] (54 y.o. [name] Primary Care Provider: [name] Other Clinician: Referring Provider: Treating Provider/Extender: [name] in Treatment: 1 Constitutional Sitting or standing Blood Pressure is within target range for patient.. Pulse regular and within target range for patient.Marland Kitchen Respirations regular, non-labored and within target range.. Temperature is normal and within the target range for the patient.Marland Kitchen Appears in no distress. Cardiovascular Pedal pulses are palpable. Notes Wound exam Mirror-image wounds over the fifth metatarsal heads black eschar partially silver nitrate discoloration from last time. Nevertheless we do not have viable surface is here. There is a horizontal area on the right Achilles area I am not sure this  is too much different from last time but not as much surface  problems On the left lateral calf again thick necrotic material. Electronic Signature(s) Signed: 10/14/2019 5:13:24 PM By: Baltazar Najjar MD Entered By: Baltazar Najjar on 10/14/2019 10:46:40 -------------------------------------------------------------------------------- Physician Orders Details Patient Name: Date of Service: [name], [name] 10/14/2019 9:45 A M Medical Record Number: [id number] Patient Account Number: [id number] Date of Birth/Sex: Treating RN: 11/08/1965 (54 y.o. 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 Dressing Change Frequency Do not change entire dressing for one week. - all wounds Wound Cleansing May shower with protection. - Dressing cannot get wet. Use cast protector if taking a shower Primary Wound Dressing Wound #1 Right Metatarsal head fifth Iodoflex Wound #2 Right Calcaneus Iodoflex Wound #3 Left Metatarsal head fifth Iodoflex Wound #4 Left,Lateral Lower Leg Iodoflex Secondary Dressing Wound #1 Right Metatarsal head fifth Dry Gauze Wound #3 Left Metatarsal head fifth Dry Gauze Wound #4 Left,Lateral Lower Leg Dry Gauze ABD pad Wound #2 Right Calcaneus Dry Gauze Heel Cup Edema Control 3 Layer Compression System - Bilateral Elevate legs to the level  of the heart or above for 30 minutes daily and/or when sitting, a frequency of: - throughout the day Off-Loading Other: - avoid pressure to sites. Electronic Signature(s) Signed: 10/14/2019 5:08:15 PM By: Zandra Abts RN, BSN Signed: 10/14/2019 5:13:24 PM By: Baltazar Najjar MD Entered By: Zandra Abts on 10/14/2019 10:16:03 -------------------------------------------------------------------------------- Problem List Details Patient Name: Date of Service: [name], [name] 10/14/2019 9:45 A M Medical Record Number: [id number] Patient Account Number: [id number] Date of Birth/Sex: Treating RN: 1965/04/02 (54 y.o. [name] Primary Care Provider: [name] Other Clinician: Referring Provider: Treating Provider/Extender: [name] in Treatment: 1 Active Problems ICD-10 Encounter Code Description Active Date MDM Diagnosis L97.518 Non-pressure chronic ulcer of other part of right foot with other specified 10/01/2019 No Yes severity L97.528 Non-pressure chronic ulcer of other part of left foot with other specified 10/01/2019 No Yes severity L89.613 Pressure ulcer of right heel, stage 3 10/01/2019 No Yes L89.623 Pressure ulcer of left heel, stage 3 10/01/2019 No Yes E11.621 Type 2 diabetes mellitus with foot ulcer 10/01/2019 No Yes L97.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 10/01/2019 No Yes G82.21 Paraplegia, complete 10/01/2019 No Yes Inactive Problems Resolved Problems Electronic Signature(s) Signed: 10/14/2019 5:13:24 PM By: Baltazar Najjar MD Entered By: Baltazar Najjar on 10/14/2019 10:42:56 -------------------------------------------------------------------------------- Progress Note Details Patient Name: Date of Service: [name], [name] 10/14/2019 9:45 A M Medical Record Number: [id number] Patient Account Number: [id number] Date of Birth/Sex: Treating RN: 1965/11/30 (55 y.o. [name] Primary  Care Provider: [name] Other Clinician: Referring Provider: Treating Provider/Extender: [name] in Treatment: 1 Subjective History of Present Illness (HPI) ADMISSION 10/02/2018 This is a 54 year old Spanish-speaking man who arrived accompanied by his wife. Predominant medical problem is T1-T6 spinal cord paraplegia  secondary to trauma after falling off a roof roughly a year ago. Apparently 6 weeks ago his wife noted blisters on his bilateral fifth metatarsal heads and heels which she feels was from excessive pressure on the foot rests of his wheelchair. He was seen in the emergency room early in August and then was admitted to hospital from 8/10 through 8/14 with cellulitis of his feet. He was ultimately discharged on Keflex. Arterial studies were done in the hospital that showed an ABI of 1.61 on the right and 1.36 on the left but triphasic waveforms on the left and biphasic on the right. DVT rule out study was negative. X-ray of the bilateral feet did not show osteomyelitis. They have been dealing with these wounds at home. I think they are using Betadine. He has Medicaid and does not have home health. He comes in today with wounds on his bilateral plantar fifth metatarsal heads Achilles area of both heels and an area on the left lateral leg. His wife explains this as he always laterally rotates his legs when he is sitting in the wheelchair or in bed. She even seemed to believe that before his injury he actually walked on the outside of his feet. 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 Paraplegia T1-T6 8/27; the patient has 4 open wounds mirror-image areas over the plantar fifth metatarsal heads. Area on the right Achilles area and then on the left lateral calf. We have been using Iodoflex. He is on Eliquis 9/2 debridement I did last week caused copious amounts of bleeding  which was difficult to control. He is on Eliquis. He has the 2 mirror-image wounds over the plantar fifth metatarsal heads in the area on the right Achilles and then an area on the left lateral calf. Both the fifth metatarsal head wounds on the left lateral calf of necrotic eschar on the surface. Black discoloration likely partially the silver nitrate we had to use last time Objective Constitutional Sitting or standing Blood Pressure is within target range for patient.. Pulse regular and within target range for patient.Marland Kitchen Respirations regular, non-labored and within target range.. Temperature is normal and within the target range for the patient.Marland Kitchen Appears in no distress. Vitals Time Taken: 9:30 AM, Height: 68 in, Weight: 235 lbs, BMI: 35.7, Temperature: 98.1 F, Pulse: 68 bpm, Respiratory Rate: 18 breaths/min, Blood Pressure: 119/64 mmHg. Cardiovascular Pedal pulses are palpable. General Notes: Wound exam ooMirror-image wounds over the fifth metatarsal heads black eschar partially silver nitrate discoloration from last time. Nevertheless we do not have viable surface is here. ooThere is a horizontal area on the right Achilles area I am not sure this is too much different from last time but not as much surface problems ooOn the left lateral calf again thick necrotic material. Integumentary (Hair, Skin) Wound #1 status is Open. Original cause of wound was Gradually Appeared. The wound is located on the Right Metatarsal head fifth. The wound measures 3.2cm length x 2cm width x 0.4cm depth; 5.027cm^2 area and 2.011cm^3 volume. There is no tunneling or undermining noted. There is a small amount of serosanguineous drainage noted. There is no granulation within the wound bed. There is a large (67-100%) amount of necrotic tissue within the wound bed including Eschar and Adherent Slough. Wound #2 status is Open. Original cause of wound was Gradually Appeared. The wound is located on the Right Calcaneus.  The wound measures 0.4cm length x 2.2cm  width x 0.2cm depth; 0.691cm^2 area and 0.138cm^3 volume. There is no tunneling or undermining noted. There is a medium amount of serosanguineous drainage noted. There is small (1-33%) granulation within the wound bed. There is a large (67-100%) amount of necrotic tissue within the wound bed including Eschar and Adherent Slough. Wound #3 status is Open. Original cause of wound was Gradually Appeared. The wound is located on the Left Metatarsal head fifth. The wound measures 3.5cm length x 2.7cm width x 0.2cm depth; 7.422cm^2 area and 1.484cm^3 volume. There is no tunneling or undermining noted. There is a none present amount of drainage noted. There is no granulation within the wound bed. There is a large (67-100%) amount of necrotic tissue within the wound bed including Eschar and Adherent Slough. Wound #4 status is Open. Original cause of wound was Pressure Injury. The wound is located on the Left,Lateral Lower Leg. The wound measures 2.1cm length x 1.4cm width x 0.2cm depth; 2.309cm^2 area and 0.462cm^3 volume. There is Fat Layer (Subcutaneous Tissue) exposed. There is no tunneling or undermining noted. There is a none present amount of drainage noted. There is small (1-33%) red granulation within the wound bed. There is a large (67-100%) amount of necrotic tissue within the wound bed including Eschar and Adherent Slough. 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 There  was a Three Layer Compression Therapy Procedure by Zandra AbtsLynch, Shatara, RN. Post procedure Diagnosis Wound #1: Same as Pre-Procedure Wound #2 Pre-procedure diagnosis of Wound #2 is a Pressure Ulcer located on the Right Calcaneus . There was a Three Layer Compression Therapy Procedure by Zandra AbtsLynch, Shatara, RN. Post procedure Diagnosis Wound #2: Same as Pre-Procedure Wound #3 Pre-procedure diagnosis of Wound #3 is a Pressure Ulcer located on the Left Metatarsal head fifth . There was a Three Layer Compression Therapy Procedure by Zandra AbtsLynch, Shatara, RN. Post procedure Diagnosis Wound #3: Same as Pre-Procedure Wound #4 Pre-procedure diagnosis of Wound #4 is a Pressure Ulcer located on the Left,Lateral Lower Leg . There was a Three Layer Compression Therapy Procedure by Zandra AbtsLynch, Shatara, RN. Post procedure Diagnosis Wound #4: Same as Pre-Procedure Plan Follow-up Appointments: Return Appointment in 1 week. Dressing Change Frequency: Do not change entire dressing for one week. - all wounds Wound Cleansing: May shower with protection. - Dressing cannot get wet. Use cast protector if taking a shower Primary Wound Dressing: Wound #1 Right Metatarsal head fifth: Iodoflex Wound #2 Right Calcaneus: Iodoflex Wound #3 Left Metatarsal head fifth: Iodoflex Wound #4 Left,Lateral Lower Leg: Iodoflex Secondary Dressing: Wound #1 Right Metatarsal head fifth: Dry Gauze Wound #3 Left Metatarsal head fifth: Dry Gauze Wound #4 Left,Lateral Lower Leg: Dry Gauze ABD pad Wound #2 Right Calcaneus: Dry Gauze Heel Cup Edema Control: 3 Layer Compression System - Bilateral Elevate legs to the level of the heart or above for 30 minutes daily and/or when sitting, a frequency of: - throughout the day Off-Loading: Other: - avoid pressure to sites. #1 for this week I am going to continue with the same dressing which is Iodoflex 2. We are going  to have to look at an aggressive debridement again next week although it was  very difficult because of bleeding last week to achieve hemostasis on Eliquis. 3. I will need to review his arterial studies. His ABIs in our clinic were noncompressible his pulses are palpable Electronic Signature(s) Signed: 10/14/2019 5:13:24 PM By: Baltazar Najjar MD Entered By: Baltazar Najjar on 10/14/2019 10:50:00 -------------------------------------------------------------------------------- SuperBill Details Patient Name: Date of Service: [name], [name] 10/14/2019 Medical Record Number: [id number] Patient Account Number: [id number] Date of Birth/Sex: Treating RN: 09/02/1965 (54 y.o. [name] Primary Care Provider: [name] Other Clinician: Referring Provider: Treating Provider/Extender: [name] in Treatment: 1 Diagnosis Coding ICD-10 Codes Code Description [phone] Non-pressure chronic ulcer of other part of right foot with other specified severity L97.528 Non-pressure chronic ulcer of other part of left foot with other specified severity L89.613 Pressure ulcer of right heel, stage 3 L89.623 Pressure ulcer of left heel, stage 3 E11.621 Type 2 diabetes mellitus with foot ulcer L97.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 G82.21 Paraplegia, complete Facility Procedures CPT4: Code [id number] Description: 81 BILATERAL: Application of multi-layer venous compression system; leg (below knee), including ankle and t. Modifier: Quantity: 1 Physician Procedures : CPT4 Code Description Modifier [id number] - WC PHYS LEVEL 3 - EST PT ICD-10 Diagnosis Description L97.518 Non-pressure chronic ulcer of other part of right foot with other specified severity L97.528 Non-pressure chronic ulcer of other part of left  foot with other specified severity E11.621 Type 2 diabetes mellitus with foot ulcer Quantity: 1 Electronic Signature(s) Signed: 10/14/2019 5:13:24 PM By: Baltazar Najjar MD Entered By:  Baltazar Najjar on 10/14/2019 10:50:57

## 2019-10-20 NOTE — Progress Notes (Signed)
[name], [name] ([id number]) Visit Report for 10/14/2019 Arrival Information Details Patient Name: Date of Service: [name], [place] 10/14/2019 9:45 A M Medical Record Number: [id number] Patient Account Number: [id number] Date of Birth/Sex: Treating RN: Jan 11, 1966 (53 y.o. [name]) Yevonne Pax Primary Care Myla Mauriello: [name] Other Clinician: Referring Hokulani Rogel: Treating Reinhart Saulters/Extender: [name] in Treatment: 1 Visit Information History Since Last Visit All ordered tests and consults were completed: No Patient Arrived: Ambulatory Added or deleted any medications: No Arrival Time: 09:27 Any new allergies or adverse reactions: No Accompanied By: self Had a fall or experienced change in No Transfer Assistance: None activities of daily living that may affect Patient Identification Verified: Yes risk of falls: Secondary Verification Process Completed: Yes Signs or symptoms of abuse/neglect since last visito No Patient Requires Transmission-Based Precautions: No Hospitalized since last visit: No Patient Has Alerts: Yes Implantable device outside of the clinic excluding No Patient Alerts: Translator Required cellular tissue based products placed in the center [name] ABI 1.61 since last visit: Left ABI 1.36 Has Dressing in Place as Prescribed: Yes Has Compression in Place as Prescribed: Yes Pain Present Now: No Electronic Signature([name]: 10/20/2019 5:14:09 PM By: Yevonne Pax RN Entered By: Yevonne Pax on 10/14/2019 09:30:01 -------------------------------------------------------------------------------- Compression Therapy Details Patient Name: Date of Service: [name] RLO [name] 10/14/2019 9:45 A M Medical Record Number: [id number] Patient Account Number: [id number] Date of Birth/Sex: Treating RN: 1965/05/13 (55 y.o. [name] Primary Care Keanna Tugwell: [name] Other Clinician: Referring Herman Fiero: Treating Rockford Leinen/Extender:  [name] in Treatment: 1 Compression Therapy Performed for Wound Assessment: Wound #1 [name] Metatarsal head fifth Performed By: Clinician Zandra Abts, RN Compression Type: Three Layer Post Procedure Diagnosis Same as Pre-procedure Electronic Signature([name]: 10/14/2019 5:08:15 PM By: Zandra Abts RN, BSN Entered By: Zandra Abts on 10/14/2019 10:16:46 -------------------------------------------------------------------------------- Compression Therapy Details Patient Name: Date of Service: [name], [place] RLO [name] 10/14/2019 9:45 A M Medical Record Number: [id number] Patient Account Number: [id number] Date of Birth/Sex: Treating RN: Jan 09, 1966 (54 y.o. [name] Primary Care Kaylin Schellenberg: [name] Other Clinician: Referring Laurna Shetley: Treating Lynnlee Revels/Extender: [name] in Treatment: 1 Compression Therapy Performed for Wound Assessment: Wound #2 [name] Calcaneus Performed By: Clinician Zandra Abts, RN Compression Type: Three Layer Post Procedure Diagnosis Same as Pre-procedure Electronic Signature([name]: 10/14/2019 5:08:15 PM By: Zandra Abts RN, BSN Entered By: Zandra Abts on 10/14/2019 10:16:46 -------------------------------------------------------------------------------- Compression Therapy Details Patient Name: Date of Service: [name], [place] RLO [name] 10/14/2019 9:45 A M Medical Record Number: [id number] Patient Account Number: [id number] Date of Birth/Sex: Treating RN: 22-Jul-1965 (54 y.o. [name] Primary Care Kemya Shed: [name] Other Clinician: Referring Sawyer Kahan: Treating Harlis Champoux/Extender: [name] in Treatment: 1 Compression Therapy Performed for Wound Assessment: Wound #3 Left Metatarsal head fifth Performed By: Clinician Zandra Abts, RN Compression Type: Three Layer Post Procedure Diagnosis Same as Pre-procedure Electronic  Signature([name]: 10/14/2019 5:08:15 PM By: Zandra Abts RN, BSN Entered By: Zandra Abts on 10/14/2019 10:16:46 -------------------------------------------------------------------------------- Compression Therapy Details Patient Name: Date of Service: [name], [place] RLO [name] 10/14/2019 9:45 A M Medical Record Number: [id number] Patient Account Number: [id number] Date of Birth/Sex: Treating RN: Sep 16, 1965 (54 y.o. [name] Primary Care Alexza Norbeck: [name] Other Clinician: Referring Emry Barbato: Treating Mckynzie Liwanag/Extender: [name] in Treatment: 1 Compression Therapy Performed for Wound Assessment: Wound #4 Left,Lateral Lower Leg Performed By: Clinician Zandra Abts, RN Compression Type: Three Layer Post Procedure  Diagnosis Same as Pre-procedure Electronic Signature([name]: 10/14/2019 5:08:15 PM By: Zandra Abts RN, BSN Entered By: Zandra Abts on 10/14/2019 10:16:47 -------------------------------------------------------------------------------- Encounter Discharge Information Details Patient Name: Date of Service: [name], [place] RLO [name] 10/14/2019 9:45 A M Medical Record Number: [id number] Patient Account Number: [id number] Date of Birth/Sex: Treating RN: 04/10/1965 (54 y.o. [name] Primary Care Phoenyx Melka: [name] Other Clinician: Referring Kamil Hanigan: Treating Adilene Areola/Extender: [name] in Treatment: 1 Encounter Discharge Information Items Discharge Condition: Stable Ambulatory Status: Wheelchair Discharge Destination: Home Transportation: Other Accompanied By: wife and interpreter Schedule Follow-up Appointment: Yes Clinical Summary of Care: Patient Declined Electronic Signature([name]: 10/14/2019 4:56:16 PM By: Cherylin Mylar Entered By: Cherylin Mylar on 10/14/2019 10:41:40 -------------------------------------------------------------------------------- Lower  Extremity Assessment Details Patient Name: Date of Service: [name] RLO [name] 10/14/2019 9:45 A M Medical Record Number: [id number] Patient Account Number: [id number] Date of Birth/Sex: Treating RN: 11/26/1965 (53 y.o. [name]) Yevonne Pax Primary Care Rhylee Pucillo: [name] Other Clinician: Referring Gary Gabrielsen: Treating Rylah Fukuda/Extender: [name] in Treatment: 1 Edema Assessment Assessed: [Left: No] [[name]: No] E[Left: dema] [[name]: :] Calf Left: [name]: Point of Measurement: cm From Medial Instep 43 cm 45 cm Ankle Left: [name]: Point of Measurement: cm From Medial Instep 28 cm 22 cm Electronic Signature([name]: 10/20/2019 5:14:09 PM By: Yevonne Pax RN Entered By: Yevonne Pax on 10/14/2019 09:36:15 -------------------------------------------------------------------------------- Multi Wound Chart Details Patient Name: Date of Service: [name], [place] RLO [name] 10/14/2019 9:45 A M Medical Record Number: [id number] Patient Account Number: [id number] Date of Birth/Sex: Treating RN: Jun 25, 1965 (54 y.o. [name] Primary Care Caeli Linehan: [name] Other Clinician: Referring Shaniquia Brafford: Treating Kaitlinn Iversen/Extender: [name] in Treatment: 1 Vital Signs Height(in): 68 Pulse(bpm): 68 Weight(lbs): 235 Blood Pressure(mmHg): 119/64 Body Mass Index(BMI): 36 Temperature(F): 98.1 Respiratory Rate(breaths/min): 18 Photos: [1:No Photos [name] Metatarsal head fifth] [2:No Photos [name] Calcaneus] [3:No Photos Left Metatarsal head fifth] Wound Location: [1:Gradually Appeared] [2:Gradually Appeared] [3:Gradually Appeared] Wounding Event: [1:Pressure Ulcer] [2:Pressure Ulcer] [3:Pressure Ulcer] Primary Etiology: [1:Hypotension, Type II Diabetes] [2:Hypotension, Type II Diabetes] [3:Hypotension, Type II Diabetes] Comorbid History: [1:08/12/2019] [2:08/12/2019] [3:08/12/2019] Date Acquired: [1:1] [2:1] [3:1] Weeks of Treatment:  [1:Open] [2:Open] [3:Open] Wound Status: [1:3.2x2x0.4] [2:0.4x2.2x0.2] [3:3.5x2.7x0.2] Measurements L x W x D (cm) [1:5.027] [2:0.691] [3:7.422] A (cm) : rea [1:2.011] [2:0.138] [3:1.484] Volume (cm) : [1:-17.60%] [2:56.00%] [3:-31.20%] % Reduction in A rea: [1:-371.00%] [2:56.10%] [3:-162.70%] % Reduction in Volume: [1:Unstageable/Unclassified] [2:Unstageable/Unclassified] [3:Unstageable/Unclassified] Classification: [1:Small] [2:Medium] [3:None Present] Exudate A mount: [1:Serosanguineous] [2:Serosanguineous] [3:N/A] Exudate Type: [1:red, brown] [2:red, brown] [3:N/A] Exudate Color: [1:None Present (0%)] [2:Small (1-33%)] [3:None Present (0%)] Granulation A mount: [1:N/A] [2:N/A] [3:N/A] Granulation Quality: [1:Large (67-100%)] [2:Large (67-100%)] [3:Large (67-100%)] Necrotic A mount: [1:Eschar, Adherent Slough] [2:Eschar, Adherent Slough] [3:Eschar, Adherent Slough] Necrotic Tissue: [1:Fascia: No] [2:Fascia: No] [3:Fascia: No] Exposed Structures: [1:Fat Layer (Subcutaneous Tissue): No Tendon: No Muscle: No Joint: No Bone: No None] [2:Fat Layer (Subcutaneous Tissue): No Tendon: No Muscle: No Joint: No Bone: No None] [3:Fat Layer (Subcutaneous Tissue): No Tendon: No Muscle: No Joint: No Bone: No  None] Epithelialization: [1:Compression Therapy] [2:Compression Therapy] [3:Compression Therapy] Wound Number: 4 N/A N/A Photos: No Photos N/A N/A Left, Lateral Lower Leg N/A N/A Wound Location: Pressure Injury N/A N/A Wounding Event: Pressure Ulcer N/A N/A Primary Etiology: Hypotension, Type II Diabetes N/A N/A Comorbid History: 08/12/2019 N/A N/A Date Acquired: 1 N/A N/A Weeks of Treatment: Open N/A N/A Wound Status: 2.1x1.4x0.2 N/A N/A Measurements L x W x D (  cm) 2.309 N/A N/A A (cm) : rea 0.462 N/A N/A Volume (cm) : 21.60% N/A N/A % Reduction in A rea: -56.60% N/A N/A % Reduction in Volume: Unstageable/Unclassified N/A N/A Classification: None Present N/A  N/A Exudate A mount: N/A N/A N/A Exudate Type: N/A N/A N/A Exudate Color: Small (1-33%) N/A N/A Granulation A mount: Red N/A N/A Granulation Quality: Large (67-100%) N/A N/A Necrotic A mount: Eschar, Adherent Slough N/A N/A Necrotic Tissue: Fat Layer (Subcutaneous Tissue): Yes N/A N/A Exposed Structures: Fascia: No Tendon: No Muscle: No Joint: No Bone: No None N/A N/A Epithelialization: Compression Therapy N/A N/A Procedures Performed: Treatment Notes Wound #1 ([name] Metatarsal head fifth) 1. Cleanse With Wound Cleanser Soap and water 3. Primary Dressing Applied Iodoflex 4. Secondary Dressing Dry Gauze Heel Cup 6. Support Layer Applied 3 layer compression wrap Notes netting Wound #2 ([name] Calcaneus) 1. Cleanse With Wound Cleanser Soap and water 3. Primary Dressing Applied Iodoflex 4. Secondary Dressing Dry Gauze Heel Cup 6. Support Layer Applied 3 layer compression wrap Notes netting Wound #3 (Left Metatarsal head fifth) 1. Cleanse With Wound Cleanser Soap and water 3. Primary Dressing Applied Iodoflex 4. Secondary Dressing Dry Gauze Heel Cup 6. Support Layer Applied 3 layer compression wrap Notes netting Wound #4 (Left, Lateral Lower Leg) 1. Cleanse With Wound Cleanser Soap and water 3. Primary Dressing Applied Iodoflex 4. Secondary Dressing Dry Gauze Heel Cup 6. Support Layer Applied 3 layer compression wrap Notes netting Electronic Signature([name]: 10/14/2019 5:08:15 PM By: Zandra AbtsLynch, Shatara RN, BSN Signed: 10/14/2019 5:13:24 PM By: Baltazar Najjarobson, Michael MD Entered By: Baltazar Najjarobson, Michael on 10/14/2019 10:43:07 -------------------------------------------------------------------------------- Multi-Disciplinary Care Plan Details Patient Name: Date of Service: Harry [place] R[place] [name] 10/14/2019 9:45 A M Medical Record Number: [id number] Patient Account Number: [id number] Date of Birth/Sex: Treating RN: 1965-09-01 (54 y.o. Harry KollerM) Lynch,  Shatara Primary Care Saralee Bolick: Gwinda PasseEdwards, Michelle Other Clinician: Referring Trevyon Swor: Treating Bjorn Hallas/Extender: Aurelio Brashobson, Michael Edwards, Michelle Weeks in Treatment: 1 Active Inactive Nutrition Nursing Diagnoses: Impaired glucose control: actual or potential Goals: Patient/caregiver verbalizes understanding of need to maintain therapeutic glucose control per primary care physician Date Initiated: 10/01/2019 Target Resolution Date: 11/05/2019 Goal Status: Active Interventions: Provide education on elevated blood sugars and impact on wound healing Notes: Pressure Nursing Diagnoses: Knowledge deficit related to causes and risk factors for pressure ulcer development Goals: Patient/caregiver will verbalize risk factors for pressure ulcer development Date Initiated: 10/01/2019 Target Resolution Date: 11/05/2019 Goal Status: Active Interventions: Provide education on pressure ulcers Notes: Wound/Skin Impairment Nursing Diagnoses: Impaired tissue integrity Goals: Ulcer/skin breakdown will have a volume reduction of 50% by week 8 Date Initiated: 10/01/2019 Target Resolution Date: 11/05/2019 Goal Status: Active Interventions: Provide education on ulcer and skin care Notes: Electronic Signature([name]: 10/14/2019 5:08:15 PM By: Zandra AbtsLynch, Shatara RN, BSN Entered By: Zandra AbtsLynch, Shatara on 10/14/2019 09:48:04 -------------------------------------------------------------------------------- Pain Assessment Details Patient Name: Date of Service: Harry Moro[place] RLO [name] 10/14/2019 9:45 A M Medical Record Number: [id number] Patient Account Number: [id number] Date of Birth/Sex: Treating RN: 1965-09-01 (53 y.o. Harry [place]M) Epps, Carrie Primary Care Marlette Curvin: Gwinda PasseEdwards, Michelle Other Clinician: Referring Kashawn Manzano: Treating Jeralyn Nolden/Extender: Aurelio Brashobson, Michael Edwards, Michelle Weeks in Treatment: 1 Active Problems Location of Pain Severity and Description of Pain Patient Has Paino No Site Locations Pain  Management and Medication Current Pain Management: Electronic Signature([name]: 10/20/2019 5:14:09 PM By: Yevonne PaxEpps, Carrie RN Entered By: Yevonne PaxEpps, Carrie on 10/14/2019 09:31:07 -------------------------------------------------------------------------------- Patient/Caregiver Education Details Patient Name: Date of Service: Harry [place] RLO [name] 9/2/2021andnbsp9:45 A M Medical Record Number: [id number]  Patient Account Number: [id number] Date of Birth/Gender: Treating RN: 05-27-1965 (54 y.o. [name] Primary Care Physician: [name] Other Clinician: Referring Physician: Treating Physician/Extender: [name] in Treatment: 1 Education Assessment Education Provided To: Patient Education Topics Provided Pressure: Methods: Explain/Verbal Responses: State content correctly Wound/Skin Impairment: Methods: Explain/Verbal Responses: State content correctly Electronic Signature([name]: 10/14/2019 5:08:15 PM By: Zandra Abts RN, BSN Entered By: Zandra Abts on 10/14/2019 09:48:28 -------------------------------------------------------------------------------- Wound Assessment Details Patient Name: Date of Service: [name], [place] RLO [name] 10/14/2019 9:45 A M Medical Record Number: [id number] Patient Account Number: [id number] Date of Birth/Sex: Treating RN: 11-22-1965 (53 y.o. [name]) Yevonne Pax Primary Care Fany Cavanaugh: [name] Other Clinician: Referring Rashad Obeid: Treating [name]/Extender: [name] in Treatment: 1 Wound Status Wound Number: 1 Primary Etiology: Pressure Ulcer Wound Location: [name] Metatarsal head fifth Wound Status: Open Wounding Event: Gradually Appeared Comorbid History: Hypotension, Type II Diabetes Date Acquired: 08/12/2019 Weeks Of Treatment: 1 Clustered Wound: No Photos Photo Uploaded By: Benjaman Kindler on 10/20/2019 12:11:35 Wound Measurements Length: (cm) 3.2 Width: (cm)  2 Depth: (cm) 0.4 Area: (cm) 5.027 Volume: (cm) 2.011 % Reduction in Area: -17.6% % Reduction in Volume: -371% Epithelialization: None Tunneling: No Undermining: No Wound Description Classification: Unstageable/Unclassified Exudate Amount: Small Exudate Type: Serosanguineous Exudate Color: red, brown Foul Odor After Cleansing: No Slough/Fibrino Yes Wound Bed Granulation Amount: None Present (0%) Exposed Structure Necrotic Amount: Large (67-100%) Fascia Exposed: No Necrotic Quality: Eschar, Adherent Slough Fat Layer (Subcutaneous Tissue) Exposed: No Tendon Exposed: No Muscle Exposed: No Joint Exposed: No Bone Exposed: No Treatment Notes Wound #1 ([name] Metatarsal head fifth) 1. Cleanse With Wound Cleanser Soap and water 3. Primary Dressing Applied Iodoflex 4. Secondary Dressing Dry Gauze Heel Cup 6. Support Layer Applied 3 layer compression wrap Notes netting Electronic Signature([name]: 10/20/2019 5:14:09 PM By: Yevonne Pax RN Entered By: Yevonne Pax on 10/14/2019 09:43:07 -------------------------------------------------------------------------------- Wound Assessment Details Patient Name: Date of Service: [name], [name] 10/14/2019 9:45 A M Medical Record Number: [id number] Patient Account Number: [id number] Date of Birth/Sex: Treating RN: 1966-01-26 (53 y.o. [name]) Yevonne Pax Primary Care Makendra Vigeant: [name] Other Clinician: Referring Cadon Raczka: Treating Haliey Romberg/Extender: [name] in Treatment: 1 Wound Status Wound Number: 2 Primary Etiology: Pressure Ulcer Wound Location: [name] Calcaneus Wound Status: Open Wounding Event: Gradually Appeared Comorbid History: Hypotension, Type II Diabetes Date Acquired: 08/12/2019 Weeks Of Treatment: 1 Clustered Wound: No Photos Photo Uploaded By: Benjaman Kindler on 10/20/2019 12:11:36 Wound Measurements Length: (cm) 0.4 Width: (cm) 2.2 Depth: (cm) 0.2 Area: (cm)  0.691 Volume: (cm) 0.138 % Reduction in Area: 56% % Reduction in Volume: 56.1% Epithelialization: None Tunneling: No Undermining: No Wound Description Classification: Unstageable/Unclassified Exudate Amount: Medium Exudate Type: Serosanguineous Exudate Color: red, brown Foul Odor After Cleansing: No Slough/Fibrino Yes Wound Bed Granulation Amount: Small (1-33%) Exposed Structure Necrotic Amount: Large (67-100%) Fascia Exposed: No Necrotic Quality: Eschar, Adherent Slough Fat Layer (Subcutaneous Tissue) Exposed: No Tendon Exposed: No Muscle Exposed: No Joint Exposed: No Bone Exposed: No Treatment Notes Wound #2 ([name] Calcaneus) 1. Cleanse With Wound Cleanser Soap and water 3. Primary Dressing Applied Iodoflex 4. Secondary Dressing Dry Gauze Heel Cup 6. Support Layer Applied 3 layer compression wrap Notes netting Electronic Signature([name]: 10/20/2019 5:14:09 PM By: Yevonne Pax RN Entered By: Yevonne Pax on 10/14/2019 09:43:17 -------------------------------------------------------------------------------- Wound Assessment Details Patient Name: Date of Service: [name] 10/14/2019 9:45 A M Medical Record Number: [id number] Patient Account Number:  [id number] Date of Birth/Sex: Treating RN: 1965/02/20 (53 y.o. [name]) Yevonne Pax Primary Care Carlee Vonderhaar: [name] Other Clinician: Referring Katira Dumais: Treating Simranjit Thayer/Extender: [name] in Treatment: 1 Wound Status Wound Number: 3 Primary Etiology: Pressure Ulcer Wound Location: Left Metatarsal head fifth Wound Status: Open Wounding Event: Gradually Appeared Comorbid History: Hypotension, Type II Diabetes Date Acquired: 08/12/2019 Weeks Of Treatment: 1 Clustered Wound: No Photos Photo Uploaded By: Benjaman Kindler on 10/20/2019 12:13:07 Wound Measurements Length: (cm) 3.5 Width: (cm) 2.7 Depth: (cm) 0.2 Area: (cm) 7.422 Volume: (cm) 1.484 % Reduction in  Area: -31.2% % Reduction in Volume: -162.7% Epithelialization: None Tunneling: No Undermining: No Wound Description Classification: Unstageable/Unclassified Exudate Amount: None Present Foul Odor After Cleansing: No Slough/Fibrino Yes Wound Bed Granulation Amount: None Present (0%) Exposed Structure Necrotic Amount: Large (67-100%) Fascia Exposed: No Necrotic Quality: Eschar, Adherent Slough Fat Layer (Subcutaneous Tissue) Exposed: No Tendon Exposed: No Muscle Exposed: No Joint Exposed: No Bone Exposed: No Treatment Notes Wound #3 (Left Metatarsal head fifth) 1. Cleanse With Wound Cleanser Soap and water 3. Primary Dressing Applied Iodoflex 4. Secondary Dressing Dry Gauze Heel Cup 6. Support Layer Applied 3 layer compression wrap Notes netting Electronic Signature([name]: 10/20/2019 5:14:09 PM By: Yevonne Pax RN Entered By: Yevonne Pax on 10/14/2019 09:43:26 -------------------------------------------------------------------------------- Wound Assessment Details Patient Name: Date of Service: [name], [name] 10/14/2019 9:45 A M Medical Record Number: [id number] Patient Account Number: [id number] Date of Birth/Sex: Treating RN: 1966-01-01 (53 y.o. [name]) Yevonne Pax Primary Care Mihailo Sage: [name] Other Clinician: Referring Omarian Jaquith: Treating Jakeim Sedore/Extender: [name] in Treatment: 1 Wound Status Wound Number: 4 Primary Etiology: Pressure Ulcer Wound Location: Left, Lateral Lower Leg Wound Status: Open Wounding Event: Pressure Injury Comorbid History: Hypotension, Type II Diabetes Date Acquired: 08/12/2019 Weeks Of Treatment: 1 Clustered Wound: No Photos Photo Uploaded By: Benjaman Kindler on 10/20/2019 12:13:08 Wound Measurements Length: (cm) 2.1 Width: (cm) 1.4 Depth: (cm) 0.2 Area: (cm) 2.309 Volume: (cm) 0.462 % Reduction in Area: 21.6% % Reduction in Volume: -56.6% Epithelialization: None Tunneling:  No Undermining: No Wound Description Classification: Unstageable/Unclassified Exudate Amount: None Present Foul Odor After Cleansing: No Slough/Fibrino Yes Wound Bed Granulation Amount: Small (1-33%) Exposed Structure Granulation Quality: Red Fascia Exposed: No Necrotic Amount: Large (67-100%) Fat Layer (Subcutaneous Tissue) Exposed: Yes Necrotic Quality: Eschar, Adherent Slough Tendon Exposed: No Muscle Exposed: No Joint Exposed: No Bone Exposed: No Treatment Notes Wound #4 (Left, Lateral Lower Leg) 1. Cleanse With Wound Cleanser Soap and water 3. Primary Dressing Applied Iodoflex 4. Secondary Dressing Dry Gauze Heel Cup 6. Support Layer Applied 3 layer compression wrap Notes netting Electronic Signature([name]: 10/20/2019 5:14:09 PM By: Yevonne Pax RN Entered By: Yevonne Pax on 10/14/2019 09:43:40 -------------------------------------------------------------------------------- Vitals Details Patient Name: Date of Service: [name], [place] RLO [name] 10/14/2019 9:45 A M Medical Record Number: [id number] Patient Account Number: [id number] Date of Birth/Sex: Treating RN: 01/17/1966 (53 y.o. [name]) Yevonne Pax Primary Care Zoejane Gaulin: [name] Other Clinician: Referring Lisamarie Coke: Treating Kien Mirsky/Extender: [name] in Treatment: 1 Vital Signs Time Taken: 09:30 Temperature (F): 98.1 Height (in): 68 Pulse (bpm): 68 Weight (lbs): 235 Respiratory Rate (breaths/min): 18 Body Mass Index (BMI): 35.7 Blood Pressure (mmHg): 119/64 Reference Range: 80 - 120 mg / dl Electronic Signature([name]: 10/20/2019 5:14:09 PM By: Yevonne Pax RN Entered By: Yevonne Pax on 10/14/2019 09:30:58

## 2019-10-21 ENCOUNTER — Encounter (HOSPITAL_BASED_OUTPATIENT_CLINIC_OR_DEPARTMENT_OTHER): Payer: Medicaid Other | Admitting: Internal Medicine

## 2019-10-21 DIAGNOSIS — E11621 Type 2 diabetes mellitus with foot ulcer: Secondary | ICD-10-CM | POA: Diagnosis not present

## 2019-10-22 NOTE — Progress Notes (Signed)
[name] ([id number]) Visit Report for 10/21/2019 Debridement Details Patient Name: Date of Service: Harry ChurchFA [name], [name] 10/21/2019 1:15 PM Medical Record Number: [id number] Patient Account Number: [id number] Date of Birth/Sex: Treating RN: [birth year] (54 y.o. [name]) Deaton, Bobbi Primary Care Provider: Gwinda PasseEdwards, Michelle Other Clinician: Referring Provider: Treating Provider/Extender: Aurelio Brashobson, Imran Nuon Edwards, Michelle Weeks in Treatment: 2 Debridement Performed for Assessment: Wound #3 Left Metatarsal head fifth Performed By: Physician Maxwell Caulobson, Nickalaus Crooke G., MD Debridement Type: Debridement Level of Consciousness (Pre-procedure): Awake and Alert Pre-procedure Verification/Time Out Yes - 13:28 Taken: Start Time: 13:29 Pain Control: Lidocaine 4% T opical Solution T Area Debrided (L x W): otal 3.4 (cm) x 3 (cm) = 10.2 (cm) Tissue and other material debrided: Viable, Non-Viable, Eschar, Slough, Subcutaneous, Skin: Dermis , Fibrin/Exudate, Slough Level: Skin/Subcutaneous Tissue Debridement Description: Excisional Instrument: Curette Bleeding: Moderate Hemostasis Achieved: Silver Nitrate End Time: 13:36 Procedural Pain: 0 Post Procedural Pain: 0 Response to Treatment: Procedure was tolerated well Level of Consciousness (Post- Awake and Alert procedure): Post Debridement Measurements of Total Wound Length: (cm) 3.4 Stage: Unstageable/Unclassified Width: (cm) 3 Depth: (cm) 0.2 Volume: (cm) 1.602 Character of Wound/Ulcer Post Debridement: Requires Further Debridement Post Procedure Diagnosis Same as Pre-procedure Electronic Signature(s) Signed: 10/21/2019 5:40:39 PM By: Shawn Stalleaton, Bobbi Signed: 10/22/2019 1:37:47 PM By: Baltazar Najjarobson, Satoshi Kalas MD Entered By: Shawn Stalleaton, Bobbi on 10/21/2019 13:37:04 -------------------------------------------------------------------------------- Debridement Details Patient Name: Date of Service: [name], [name] 10/21/2019 1:15 PM Medical Record Number:  [id number] Patient Account Number: [id number] Date of Birth/Sex: Treating RN: [birth year] (54 y.o. [name]) Deaton, Bobbi Primary Care Provider: Gwinda PasseEdwards, Michelle Other Clinician: Referring Provider: Treating Provider/Extender: Aurelio Brashobson, Macgregor Aeschliman Edwards, Michelle Weeks in Treatment: 2 Debridement Performed for Assessment: Wound #4 Left,Lateral Lower Leg Performed By: Physician Maxwell Caulobson, Daniela Hernan G., MD Debridement Type: Debridement Level of Consciousness (Pre-procedure): Awake and Alert Pre-procedure Verification/Time Out Yes - 13:28 Taken: Start Time: 13:29 Pain Control: Lidocaine 4% T opical Solution T Area Debrided (L x W): otal 2 (cm) x 1 (cm) = 2 (cm) Tissue and other material debrided: Viable, Non-Viable, Eschar, Slough, Subcutaneous, Skin: Dermis , Fibrin/Exudate, Slough Level: Skin/Subcutaneous Tissue Debridement Description: Excisional Instrument: Curette Bleeding: Moderate Hemostasis Achieved: Silver Nitrate End Time: 13:36 Procedural Pain: 0 Post Procedural Pain: 0 Response to Treatment: Procedure was tolerated well Level of Consciousness (Post- Awake and Alert procedure): Post Debridement Measurements of Total Wound Length: (cm) 2 Stage: Unstageable/Unclassified Width: (cm) 1 Depth: (cm) 0.2 Volume: (cm) 0.314 Character of Wound/Ulcer Post Debridement: Requires Further Debridement Post Procedure Diagnosis Same as Pre-procedure Electronic Signature(s) Signed: 10/21/2019 5:40:39 PM By: Shawn Stalleaton, Bobbi Signed: 10/22/2019 1:37:47 PM By: Baltazar Najjarobson, Daviona Herbert MD Entered By: Shawn Stalleaton, Bobbi on 10/21/2019 13:39:09 -------------------------------------------------------------------------------- Debridement Details Patient Name: Date of Service: [name], [name] 10/21/2019 1:15 PM Medical Record Number: [id number] Patient Account Number: [id number] Date of Birth/Sex: Treating RN: [birth year] (54 y.o. [name]) Deaton, Bobbi Primary Care Provider: Gwinda PasseEdwards, Michelle Other Clinician: Referring  Provider: Treating Provider/Extender: Aurelio Brashobson, Vian Fluegel Edwards, Michelle Weeks in Treatment: 2 Debridement Performed for Assessment: Wound #1 Right Metatarsal head fifth Performed By: Physician Maxwell Caulobson, Sheridyn Canino G., MD Debridement Type: Debridement Level of Consciousness (Pre-procedure): Awake and Alert Pre-procedure Verification/Time Out Yes - 13:28 Taken: Start Time: 13:29 Pain Control: Lidocaine 4% T opical Solution T Area Debrided (L x W): otal 3.9 (cm) x 2.5 (cm) = 9.75 (cm) Tissue and other material debrided: Viable, Non-Viable, Eschar, Slough, Subcutaneous, Skin: Dermis , Fibrin/Exudate, Slough Level: Skin/Subcutaneous Tissue Debridement Description: Excisional Instrument: Curette Bleeding: Moderate Hemostasis Achieved: Silver Nitrate End  Time: 13:36 Procedural Pain: 0 Post Procedural Pain: 0 Response to Treatment: Procedure was tolerated well Level of Consciousness (Post- Awake and Alert procedure): Post Debridement Measurements of Total Wound Length: (cm) 3.9 Stage: Unstageable/Unclassified Width: (cm) 2.5 Depth: (cm) 0.2 Volume: (cm) 1.532 Character of Wound/Ulcer Post Debridement: Requires Further Debridement Post Procedure Diagnosis Same as Pre-procedure Electronic Signature(s) Signed: 10/21/2019 5:40:39 PM By: Shawn Stall Signed: 10/22/2019 1:37:47 PM By: Baltazar Najjar MD Entered By: Baltazar Najjar on 10/21/2019 14:05:10 -------------------------------------------------------------------------------- HPI Details Patient Name: Date of Service: Harry Moro, [name] 10/21/2019 1:15 PM Medical Record Number: [id number] Patient Account Number: [id number] Date of Birth/Sex: Treating RN: Sep 05, 1965 (54 y.o. [name] Primary Care Provider: [name] Other Clinician: Referring Provider: Treating Provider/Extender: [name] in Treatment: 2 History of Present Illness HPI Description: ADMISSION 10/02/2018 This is a  53 year old Spanish-speaking man who arrived accompanied by his wife. 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 Apparently 6 weeks ago his wife noted blisters on his bilateral fifth metatarsal heads and heels which she feels was from excessive pressure on the foot rests of his wheelchair. He was seen in the emergency room early in August and then was admitted to hospital from 8/10 through 8/14 with cellulitis of his feet. He was ultimately discharged on Keflex. Arterial studies were done in the hospital that showed an ABI of 1.61 on the right and 1.36 on the left but triphasic waveforms on the left and biphasic on the right. DVT rule out study was negative. X-ray of the bilateral feet did not show osteomyelitis. They have been dealing with these wounds at home. I think they are using Betadine. He has Medicaid and does not have home health. He comes in today with wounds on his bilateral plantar fifth metatarsal heads Achilles area of both heels and an area on the left lateral leg. His wife explains this as he always laterally rotates his legs when he is sitting in the wheelchair or in bed. She even seemed to believe that before his injury he actually walked on the outside of his feet. 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 Paraplegia T1-T6 8/27; the patient has 4 open wounds mirror-image areas over the plantar fifth metatarsal heads. Area on the right Achilles area and then on the left lateral calf. We have been using Iodoflex. He is on Eliquis 9/2 debridement I did last week caused copious amounts of bleeding which was difficult to control. He is on Eliquis. He has the 2 mirror-image wounds over the plantar fifth metatarsal heads in the area on the right Achilles and then an area on the left lateral calf. Both the fifth metatarsal head wounds on  the left lateral calf of necrotic eschar on the surface. Black discoloration likely partially the silver nitrate we had to use last time 9/9; he arrives back in clinic today with the wounds on the plantar fifth metatarsal heads and exactly the same nonviable situation. He also has an area on the Achilles part of his right heel and the left lateral leg in a very similar situation. Nothing is making much progress I took some time to review his arterial studies from his hospitalization before he came to this clinic. On the right he had noncompressible ABIs with biphasic waveforms. They did not do a TBI in the right.  On the left he had noncompressible ABIs at 1.36. However his TBI was 0.70 with triphasic waveforms at the dorsalis pedis he had a DVT rule out study that was negative. We have been using Iodoflex without much progress back in compression as he does not have access to wound care supplies Electronic Signature(s) Signed: 10/22/2019 1:37:47 PM By: Baltazar Najjar MD Entered By: Baltazar Najjar on 10/21/2019 14:07:20 -------------------------------------------------------------------------------- Physical Exam Details Patient Name: Date of Service: Harry Moro, [name] 10/21/2019 1:15 PM Medical Record Number: [id number] Patient Account Number: [id number] Date of Birth/Sex: Treating RN: 04-Jun-1965 (54 y.o. [name] Primary Care Provider: [name] Other Clinician: Referring Provider: Treating Provider/Extender: [name] in Treatment: 2 Constitutional Sitting or standing Blood Pressure is within target range for patient.. Pulse regular and within target range for patient.Marland Kitchen Respirations regular, non-labored and within target range.. Temperature is normal and within the target range for the patient.Marland Kitchen Appears in no distress. Notes Wound exam The mirror-image wounds on the plantar metatarsal heads completely nonviable necrotic tissue around the  circumference and over the wound beds. Difficult debridements using a #5 curette hemostasis with silver nitrate and a pressure dressing. I keep hoping that I will have a better surface next week however this is not come to fruition He has an area on the Achilles right heel with some debris on the surface this is not changed much either The area on the left still black adherent necrotic debris. I removed this with a #5 curette this is not changed that much either. Electronic Signature(s) Signed: 10/22/2019 1:37:47 PM By: Baltazar Najjar MD Entered By: Baltazar Najjar on 10/21/2019 14:11:07 -------------------------------------------------------------------------------- Physician Orders Details Patient Name: Date of Service: Harry Moro, [name] 10/21/2019 1:15 PM Medical Record Number: [id number] Patient Account Number: [id number] Date of Birth/Sex: Treating RN: 1965/08/06 (54 y.o. [name] Primary Care Provider: [name] Other Clinician: Referring Provider: Treating Provider/Extender: [name] in Treatment: 2 Verbal / Phone Orders: No Diagnosis Coding ICD-10 Coding Code Description [phone] Non-pressure chronic ulcer of other part of right foot with other specified severity L97.528 Non-pressure chronic ulcer of other part of left foot with other specified severity L89.613 Pressure ulcer of right heel, stage 3 L89.623 Pressure ulcer of left heel, stage 3 E11.621 Type 2 diabetes mellitus with foot ulcer L97.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 G82.21 Paraplegia, complete Follow-up Appointments Return Appointment in 1 week. Dressing Change Frequency Do not change entire dressing for one week. - all wounds Skin Barriers/Peri-Wound Care Moisturizing lotion - both legs. Wound Cleansing May shower with protection. - Dressing cannot get wet. Use cast protector if taking a shower Primary Wound Dressing Wound  #1 Right Metatarsal head fifth Cutimed Sorbact - with hydrogel. Wound #2 Right Calcaneus Cutimed Sorbact - with hydrogel. Wound #3 Left Metatarsal head fifth Cutimed Sorbact - with hydrogel. Wound #4 Left,Lateral Lower Leg Cutimed Sorbact - with hydrogel. Secondary Dressing Wound #1 Right Metatarsal head fifth Dry Gauze Wound #2 Right Calcaneus Dry Gauze Heel Cup Wound #3 Left Metatarsal head fifth Dry Gauze Wound #4 Left,Lateral Lower Leg Dry Gauze ABD pad Edema Control 3 Layer Compression System - Bilateral Elevate legs to the level of the heart or above for 30 minutes daily and/or when sitting, a frequency of: - throughout the day Off-Loading Other: - avoid pressure to sites. Electronic Signature(s) Signed: 10/21/2019 5:40:39 PM By: Shawn Stall Signed: 10/22/2019 1:37:47 PM By: Baltazar Najjar MD  Entered By: Shawn Stall on 10/21/2019 13:39:59 -------------------------------------------------------------------------------- Problem List Details Patient Name: Date of Service: [name] 10/21/2019 1:15 PM Medical Record Number: [id number] Patient Account Number: [id number] Date of Birth/Sex: Treating RN: 07-Oct-1965 (54 y.o. [name], Millard.Loa Primary Care Provider: [name] Other Clinician: Referring Provider: Treating Provider/Extender: [name] in Treatment: 2 Active Problems ICD-10 Encounter Code Description Active Date MDM Diagnosis L97.518 Non-pressure chronic ulcer of other part of right foot with other specified 10/01/2019 No Yes severity L97.528 Non-pressure chronic ulcer of other part of left foot with other specified 10/01/2019 No Yes severity L89.613 Pressure ulcer of right heel, stage 3 10/01/2019 No Yes L89.623 Pressure ulcer of left heel, stage 3 10/01/2019 No Yes E11.621 Type 2 diabetes mellitus with foot ulcer 10/01/2019 No Yes L97.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 [name] Primary Care Provider: [name] Other Clinician: Referring Provider: Treating Provider/Extender: [name] in Treatment: 2 Subjective History of Present Illness (HPI) ADMISSION 10/02/2018 This is a 54 year old Spanish-speaking man who arrived accompanied by his wife. 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 Apparently 6 weeks ago his wife noted blisters on his bilateral fifth metatarsal heads and heels which she feels was from excessive pressure on the foot rests of his wheelchair. He was seen in the emergency room early in August and then was admitted to hospital from 8/10 through 8/14 with cellulitis of his feet. He was ultimately discharged on Keflex. Arterial studies were done in the hospital that showed an ABI of 1.61 on the right and 1.36 on the left but triphasic waveforms on the left and biphasic on the right. DVT rule out study was negative. X-ray of the bilateral feet did not show osteomyelitis. They have been dealing with these wounds at home. I think they are using Betadine. He has Medicaid and does not have home health. He comes in today with wounds on his bilateral plantar fifth metatarsal heads Achilles area of both heels and an area on the left lateral leg. His wife explains this as he always laterally rotates his legs when he is  sitting in the wheelchair or in bed. She even seemed to believe that before his injury he actually walked on the outside of his feet. 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 Paraplegia T1-T6 8/27; the patient has 4 open wounds mirror-image areas over the plantar fifth metatarsal heads. Area on the right Achilles area and then on the left lateral calf. We have been using Iodoflex. He is on Eliquis 9/2 debridement I did last week caused copious amounts of bleeding which was difficult to control. He is on Eliquis. He has the 2 mirror-image wounds over the plantar fifth metatarsal heads in the area on the right Achilles and then an area on the left lateral calf. Both the fifth metatarsal head wounds on the left lateral calf of necrotic eschar on the surface. Black  discoloration likely partially the silver nitrate we had to use last time 9/9; he arrives back in clinic today with the wounds on the plantar fifth metatarsal heads and exactly the same nonviable situation. He also has an area on the Achilles part of his right heel and the left lateral leg in a very similar situation. Nothing is making much progress I took some time to review his arterial studies from his hospitalization before he came to this clinic. On the right he had noncompressible ABIs with biphasic waveforms. They did not do a TBI in the right. On the left he had noncompressible ABIs at 1.36. However his TBI was 0.70 with triphasic waveforms at the dorsalis pedis he had a DVT rule out study that was negative. We have been using Iodoflex without much progress back in compression as he does not have access to wound care supplies Objective Constitutional Sitting or standing Blood Pressure is within target range for patient.. Pulse regular and within target range for patient.Marland Kitchen Respirations regular, non-labored and within target range.. Temperature is normal and  within the target range for the patient.Marland Kitchen Appears in no distress. Vitals Time Taken: 12:59 PM, Height: 68 in, Weight: 235 lbs, BMI: 35.7, Temperature: 98.4 F, Pulse: 76 bpm, Respiratory Rate: 18 breaths/min, Blood Pressure: 127/69 mmHg. General Notes: Wound exam ooThe mirror-image wounds on the plantar metatarsal heads completely nonviable necrotic tissue around the circumference and over the wound beds. Difficult debridements using a #5 curette hemostasis with silver nitrate and a pressure dressing. I keep hoping that I will have a better surface next week however this is not come to fruition [facility] has an area on the Achilles right heel with some debris on the surface this is not changed much either ooThe area on the left still black adherent necrotic debris. I removed this with a #5 curette this is not changed that much either. Integumentary (Hair, Skin) Wound #1 status is Open. Original cause of wound was Gradually Appeared. The wound is located on the Right Metatarsal head fifth. The wound measures 3.9cm length x 2.5cm width x 0.1cm depth; 7.658cm^2 area and 0.766cm^3 volume. There is no tunneling or undermining noted. There is a none present amount of drainage noted. The wound margin is distinct with the outline attached to the wound base. There is no granulation within the wound bed. There is a large (67- 100%) amount of necrotic tissue within the wound bed including Eschar. Wound #2 status is Open. Original cause of wound was Gradually Appeared. The wound is located on the Right Calcaneus. The wound measures 0.5cm length x 2.5cm width x 0.1cm depth; 0.982cm^2 area and 0.098cm^3 volume. There is no tunneling or undermining noted. There is a medium amount of serosanguineous drainage noted. The wound margin is distinct with the outline attached to the wound base. There is small (1-33%) pink granulation within the wound bed. There is a large (67-100%) amount of necrotic tissue within the wound  bed including Eschar and Adherent Slough. Wound #3 status is Open. Original cause of wound was Gradually Appeared. The wound is located on the Left Metatarsal head fifth. The wound measures 3.4cm length x 3cm width x 0.1cm depth; 8.011cm^2 area and 0.801cm^3 volume. There is no tunneling or undermining noted. There is a none present amount of drainage noted. The wound margin is distinct with the outline attached to the wound base. There is no granulation within the wound bed. There is a large (67- 100%) amount of necrotic tissue within the wound  bed including Eschar. Wound #4 status is Open. Original cause of wound was Pressure Injury. The wound is located on the Left,Lateral Lower Leg. The wound measures 2cm length x 1cm width x 0.2cm depth; 1.571cm^2 area and 0.314cm^3 volume. There is Fat Layer (Subcutaneous Tissue) exposed. There is no tunneling or undermining noted. There is a small amount of serous drainage noted. The wound margin is distinct with the outline attached to the wound base. There is small (1-33%) red granulation within the wound bed. There is a large (67-100%) amount of necrotic tissue within the wound bed including Eschar and Adherent Slough. 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 There was a Excisional Skin/Subcutaneous Tissue Debridement with a total area of 9.75 sq cm performed by Maxwell Caul., MD. With the following instrument(s): Curette to remove Viable and  Non-Viable tissue/material. Material removed includes Eschar, Subcutaneous Tissue, Slough, Skin: Dermis, and Fibrin/Exudate after achieving pain control using Lidocaine 4% T opical Solution. A time out was conducted at 13:28, prior to the start of the procedure. A Moderate amount of bleeding was controlled with Silver Nitrate. The procedure was tolerated well with a pain level of 0 throughout and a pain level of 0 following the procedure. Post Debridement Measurements: 3.9cm length x 2.5cm width x 0.2cm depth; 1.532cm^3 volume. Post debridement Stage noted as Unstageable/Unclassified. Character of Wound/Ulcer Post Debridement requires further debridement. Post procedure Diagnosis Wound #1: Same as Pre-Procedure Pre-procedure diagnosis of Wound #1 is a Pressure Ulcer located on the Right Metatarsal head fifth . There was a Three Layer Compression Therapy Procedure by Zenaida Deed, RN. Post procedure Diagnosis Wound #1: Same as Pre-Procedure Wound #3 Pre-procedure diagnosis of Wound #3 is a Pressure Ulcer located on the Left Metatarsal head fifth . There was a Excisional Skin/Subcutaneous Tissue Debridement with a total area of 10.2 sq cm performed by Maxwell Caul., MD. With the following instrument(s): Curette to remove Viable and Non-Viable tissue/material. Material removed includes Eschar, Subcutaneous Tissue, Slough, Skin: Dermis, and Fibrin/Exudate after achieving pain control using Lidocaine 4% T opical Solution. A time out was conducted at 13:28, prior to the start of the procedure. A Moderate amount of bleeding was controlled with Silver Nitrate. The procedure was tolerated well with a pain level of 0 throughout and a pain level of 0 following the procedure. Post Debridement Measurements: 3.4cm length x 3cm width x 0.2cm depth; 1.602cm^3 volume. Post debridement Stage noted as Unstageable/Unclassified. Character of Wound/Ulcer Post Debridement requires further debridement. Post  procedure Diagnosis Wound #3: Same as Pre-Procedure Pre-procedure diagnosis of Wound #3 is a Pressure Ulcer located on the Left Metatarsal head fifth . There was a Three Layer Compression Therapy Procedure by Zenaida Deed, RN. Post procedure Diagnosis Wound #3: Same as Pre-Procedure Wound #4 Pre-procedure diagnosis of Wound #4 is a Pressure Ulcer located on the Left,Lateral Lower Leg . There was a Excisional Skin/Subcutaneous Tissue Debridement with a total area of 2 sq cm performed by Maxwell Caul., MD. With the following instrument(s): Curette to remove Viable and Non-Viable tissue/material. Material removed  includes Eschar, Subcutaneous Tissue, Slough, Skin: Dermis, and Fibrin/Exudate after achieving pain control using Lidocaine 4% T opical Solution. A time out was conducted at 13:28, prior to the start of the procedure. A Moderate amount of bleeding was controlled with Silver Nitrate. The procedure was tolerated well with a pain level of 0 throughout and a pain level of 0 following the procedure. Post Debridement Measurements: 2cm length x 1cm width x 0.2cm depth; 0.314cm^3 volume. Post debridement Stage noted as Unstageable/Unclassified. Character of Wound/Ulcer Post Debridement requires further debridement. Post procedure Diagnosis Wound #4: Same as Pre-Procedure Pre-procedure diagnosis of Wound #4 is a Pressure Ulcer located on the Left,Lateral Lower Leg . There was a Three Layer Compression Therapy Procedure by Zenaida Deed, RN. Post procedure Diagnosis Wound #4: Same as Pre-Procedure Wound #2 Pre-procedure diagnosis of Wound #2 is a Pressure Ulcer located on the Right Calcaneus . There was a Three Layer Compression Therapy Procedure by Zenaida Deed, RN. Post procedure Diagnosis Wound #2: Same as Pre-Procedure Plan Follow-up Appointments: Return Appointment in 1 week. Dressing Change Frequency: Do not change entire dressing for one week. - all wounds Skin  Barriers/Peri-Wound Care: Moisturizing lotion - both legs. Wound Cleansing: May shower with protection. - Dressing cannot get wet. Use cast protector if taking a shower Primary Wound Dressing: Wound #1 Right Metatarsal head fifth: Cutimed Sorbact - with hydrogel. Wound #2 Right Calcaneus: Cutimed Sorbact - with hydrogel. Wound #3 Left Metatarsal head fifth: Cutimed Sorbact - with hydrogel. Wound #4 Left,Lateral Lower Leg: Cutimed Sorbact - with hydrogel. Secondary Dressing: Wound #1 Right Metatarsal head fifth: Dry Gauze Wound #2 Right Calcaneus: Dry Gauze Heel Cup Wound #3 Left Metatarsal head fifth: Dry Gauze Wound #4 Left,Lateral Lower Leg: Dry Gauze ABD pad Edema Control: 3 Layer Compression System - Bilateral Elevate legs to the level of the heart or above for 30 minutes daily and/or when sitting, a frequency of: - throughout the day Off-Loading: Other: - avoid pressure to sites. 1. I have change the primary dressing to Sorbact. He again requires an extensive debridement today. I am really not understanding why we are not making more progress with this. 2. No current evidence of infection 3. His ABIs suggested noncompressible however 1 TBI was reasonably normal at 0.70 on the left. Waveforms were biphasic on the right triphasic on the left. This does not suggest he should have been adequate blood flow to heal wounds in this area however may need to consider referring to vascular Electronic Signature(s) Signed: 10/22/2019 1:37:47 PM By: Baltazar Najjar MD Entered By: Baltazar Najjar on 10/21/2019 14:12:46 -------------------------------------------------------------------------------- SuperBill Details Patient Name: Date of Service: Harry Moro, [name] 10/21/2019 Medical Record Number: [id number] Patient Account Number: [id number] Date of Birth/Sex: Treating RN: [birth year] (54 y.o. 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, complete Facility Procedures CPT4 Code: [id number] Description: 11042 - DEB SUBQ TISSUE 20 SQ CM/< ICD-10 Diagnosis Description L97.518 Non-pressure chronic ulcer of other part of right foot with other specified sev L97.528  Non-pressure chronic ulcer of other part of left foot with other specified seve  L97.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 Modifier: erity rity Quantity: 1 CPT4 Code: [id number] Description: 11045 - DEB SUBQ TISS EA ADDL 20CM ICD-10 Diagnosis Description L97.528 Non-pressure chronic ulcer of other part of left foot with other specified seve L97.518 Non-pressure chronic ulcer of other part of right foot with other specified sev Modifier: rity erity Quantity: 1 Physician Procedures : CPT4 Code Description Modifier [id number] 11042 - WC PHYS SUBQ TISS 20 SQ CM ICD-10 Diagnosis Description L97.518 Non-pressure chronic ulcer of other part of right foot with other specified severity L97.528 Non-pressure chronic ulcer of other part of left  foot with other specified severity L97.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 Quantity: 1 : [id number] 11045 - WC PHYS SUBQ TISS EA ADDL 20 CM ICD-10 Diagnosis Description L97.528 Non-pressure chronic ulcer of other part of left foot with other specified severity L97.518 Non-pressure chronic ulcer of other part of right foot  with other specified  severity Quantity: 1 Electronic Signature(s) Signed: 10/22/2019 1:37:47 PM By: Baltazar Najjar MD Entered By: Baltazar Najjar on 10/21/2019 14:13:16

## 2019-10-22 NOTE — Progress Notes (Signed)
Harry, [name] ([id number]) Visit Report for 10/21/2019 Arrival Information Details Patient Name: Date of Service: [name] 10/21/2019 1:15 PM Medical Record Number: [id number] Patient Account Number: [id number] Date of Birth/Sex: Treating RN: May 20, 1965 (54 y.o. [name] Primary Care Sherena Machorro: [name] Other Clinician: Referring Hakiem Malizia: Treating Kyaire Gruenewald/Extender: [name] in Treatment: 2 Visit Information History Since Last Visit Added or deleted any medications: No Patient Arrived: Wheel Chair Any new allergies or adverse reactions: No Arrival Time: 12:58 Had a fall or experienced change in No Accompanied By: interpreter and wife activities of daily living that may affect Transfer Assistance: None risk of falls: Patient Identification Verified: Yes Signs or symptoms of abuse/neglect since last visito No Secondary Verification Process Completed: Yes Hospitalized since last visit: No Patient Requires Transmission-Based Precautions: No Implantable device outside of the clinic excluding No Patient Has Alerts: Yes cellular tissue based products placed in the center Patient Alerts: Translator Required since last visit: right ABI 1.61 Has Dressing in Place as Prescribed: Yes Left ABI 1.36 Has Compression in Place as Prescribed: Yes Pain Present Now: No Electronic Signature(s) Signed: 10/21/2019 5:17:55 PM By: Cherylin Mylar Entered By: Cherylin Mylar on 10/21/2019 12:59:05 -------------------------------------------------------------------------------- Compression Therapy Details Patient Name: Date of Service: [name], [name] 10/21/2019 1:15 PM Medical Record Number: [id number] Patient Account Number: [id number] Date of Birth/Sex: Treating RN: 1965-07-10 (54 y.o. [name] Primary Care Tamecia Mcdougald: [name] Other Clinician: Referring Lorrane Mccay: Treating [name]/Extender: [name] in Treatment: 2 Compression Therapy Performed for Wound Assessment: Wound #1 Right Metatarsal head fifth Performed By: Clinician Zenaida Deed, RN Compression Type: Three Layer Post Procedure Diagnosis Same as Pre-procedure Electronic Signature(s) Signed: 10/21/2019 5:40:39 PM By: Shawn Stall Entered By: Shawn Stall on 10/21/2019 13:40:13 -------------------------------------------------------------------------------- Compression Therapy Details Patient Name: Date of Service: [name] 10/21/2019 1:15 PM Medical Record Number: [id number] Patient Account Number: [id number] Date of Birth/Sex: Treating RN: 1965/09/04 (54 y.o. [name] Primary Care [name]: [name] Other Clinician: Referring Naviyah Schaffert: Treating Chanel Mcadams/Extender: [name] in Treatment: 2 Compression Therapy Performed for Wound Assessment: Wound #4 Left,Lateral Lower Leg Performed By: Clinician Zenaida Deed, RN Compression Type: Three Layer Post Procedure Diagnosis Same as Pre-procedure Electronic Signature(s) Signed: 10/21/2019 5:40:39 PM By: Shawn Stall Entered By: Shawn Stall on 10/21/2019 13:40:14 -------------------------------------------------------------------------------- Compression Therapy Details Patient Name: Date of Service: [name] 10/21/2019 1:15 PM Medical Record Number: [id number] Patient Account Number: [id number] Date of Birth/Sex: Treating RN: 1965/04/15 (54 y.o. [name] Primary Care [name]: [name] Other Clinician: Referring Teretha Chalupa: Treating [name]/Extender: [name] in Treatment: 2 Compression Therapy Performed for Wound Assessment: Wound #2 Right Calcaneus Performed By: Clinician Zenaida Deed, RN Compression Type: Three Layer Post Procedure Diagnosis Same as Pre-procedure Electronic Signature(s) Signed: 10/21/2019 5:40:39 PM By: Shawn Stall Entered By: Shawn Stall on 10/21/2019 13:40:14 -------------------------------------------------------------------------------- Compression Therapy Details Patient Name: Date of Service: [name] 10/21/2019 1:15 PM Medical Record Number: [id number] Patient Account Number: [id number] Date of Birth/Sex: Treating RN: [birth year] (54 y.o. [name] Primary Care [name]: [name] Other Clinician: Referring [name]: Treating Bryler Dibble/Extender: [name] in Treatment: 2 Compression Therapy Performed for Wound Assessment: Wound #3 Left Metatarsal head fifth Performed By: Clinician Zenaida Deed, RN Compression Type: Three Layer Post Procedure Diagnosis Same as Pre-procedure Electronic Signature(s) Signed: 10/21/2019 5:40:39 PM By: Shawn Stall Entered By: Shawn Stall  on 10/21/2019 13:40:14 -------------------------------------------------------------------------------- Encounter Discharge Information Details Patient Name: Date of Service: [name] 10/21/2019 1:15 PM Medical Record Number: [id number] Patient Account Number: [id number] Date of Birth/Sex: Treating RN: 02/23/1965 (54 y.o. [name] Primary Care [name]: [name] Other Clinician: Referring Khya Halls: Treating Ziair Penson/Extender: [name] in Treatment: 2 Encounter Discharge Information Items Post Procedure Vitals Discharge Condition: Stable Temperature (F): 98.4 Ambulatory Status: Wheelchair Pulse (bpm): 76 Discharge Destination: Home Respiratory Rate (breaths/min): 18 Transportation: Private Auto Blood Pressure (mmHg): 127/69 Accompanied By: spouse and interpreter Schedule Follow-up Appointment: Yes Clinical Summary of Care: Patient Declined Electronic Signature(s) Signed: 10/21/2019 5:07:23 PM By: Zenaida Deed RN, BSN Entered By: Zenaida Deed on 10/21/2019  14:37:12 -------------------------------------------------------------------------------- Lower Extremity Assessment Details Patient Name: Date of Service: [name], [name] 10/21/2019 1:15 PM Medical Record Number: [id number] Patient Account Number: [id number] Date of Birth/Sex: Treating RN: 24-Jan-1966 (54 y.o. [name] Primary Care Lennox Leikam: [name] Other Clinician: Referring Reigan Tolliver: Treating Arieonna Medine/Extender: [name] in Treatment: 2 Edema Assessment Assessed: Kyra Searles: No] Franne Forts: No] E[Left: dema] [Right: :] Calf Left: Right: Point of Measurement: cm From Medial Instep 43 cm 45 cm Ankle Left: Right: Point of Measurement: cm From Medial Instep 28 cm 28 cm Vascular Assessment Pulses: Dorsalis Pedis Palpable: [Left:No] [Right:No] Electronic Signature(s) Signed: 10/21/2019 5:17:55 PM By: Cherylin Mylar Entered By: Cherylin Mylar on 10/21/2019 13:07:16 -------------------------------------------------------------------------------- Multi Wound Chart Details Patient Name: Date of Service: [name], [name] 10/21/2019 1:15 PM Medical Record Number: [id number] Patient Account Number: [id number] Date of Birth/Sex: Treating RN: Jun 24, 1965 (54 y.o. [name], Millard.Loa Primary Care Raneshia Derick: [name] Other Clinician: Referring Deven Furia: Treating Romelo Sciandra/Extender: [name] in Treatment: 2 Vital Signs Height(in): 68 Pulse(bpm): 76 Weight(lbs): 235 Blood Pressure(mmHg): 127/69 Body Mass Index(BMI): 36 Temperature(F): 98.4 Respiratory Rate(breaths/min): 18 Photos: [1:No Photos Right Metatarsal head fifth] [2:No Photos Right Calcaneus] [3:No Photos Left Metatarsal head fifth] Wound Location: [1:Gradually Appeared] [2:Gradually Appeared] [3:Gradually Appeared] Wounding Event: [1:Pressure Ulcer] [2:Pressure Ulcer] [3:Pressure Ulcer] Primary Etiology: [1:Hypotension, Type II  Diabetes] [2:Hypotension, Type II Diabetes] [3:Hypotension, Type II Diabetes] Comorbid History: [1:08/12/2019] [2:08/12/2019] [3:08/12/2019] Date Acquired: [1:2] [2:2] [3:2] Weeks of Treatment: [1:Open] [2:Open] [3:Open] Wound Status: [1:3.9x2.5x0.1] [2:0.5x2.5x0.1] [3:3.4x3x0.1] Measurements L x W x D (cm) [1:7.658] [2:0.982] [3:8.011] A (cm) : rea [1:0.766] [2:0.098] [3:0.801] Volume (cm) : [1:-79.20%] [2:37.50%] [3:-41.70%] % Reduction in A rea: [1:-79.40%] [2:68.80%] [3:-41.80%] % Reduction in Volume: [1:Unstageable/Unclassified] [2:Unstageable/Unclassified] [3:Unstageable/Unclassified] Classification: [1:None Present] [2:Medium] [3:None Present] Exudate A mount: [1:N/A] [2:Serosanguineous] [3:N/A] Exudate Type: [1:N/A] [2:red, brown] [3:N/A] Exudate Color: [1:Distinct, outline attached] [2:Distinct, outline attached] [3:Distinct, outline attached] Wound Margin: [1:None Present (0%)] [2:Small (1-33%)] [3:None Present (0%)] Granulation A mount: [1:N/A] [2:Pink] [3:N/A] Granulation Quality: [1:Large (67-100%)] [2:Large (67-100%)] [3:Large (67-100%)] Necrotic A mount: [1:Eschar] [2:Eschar, Adherent Slough] [3:Eschar] Necrotic Tissue: [1:Fascia: No] [2:Fascia: No] [3:Fascia: No] Exposed Structures: [1:Fat Layer (Subcutaneous Tissue): No Tendon: No Muscle: No Joint: No Bone: No None] [2:Fat Layer (Subcutaneous Tissue): No Tendon: No Muscle: No Joint: No Bone: No None] [3:Fat Layer (Subcutaneous Tissue): No Tendon: No Muscle: No Joint: No Bone: No  None] Epithelialization: [1:Debridement - Excisional] [2:N/A] [3:Debridement - Excisional] Debridement: Pre-procedure Verification/Time Out 13:28 [2:N/A] [3:13:28] Taken: [1:Lidocaine 4% T opical Solution] [2:N/A] [3:Lidocaine 4% T opical Solution] Pain Control: [1:Necrotic/Eschar, Subcutaneous,] [2:N/A] [3:Necrotic/Eschar, Subcutaneous,] Tissue Debrided: [1:Slough Skin/Subcutaneous Tissue] [2:N/A] [3:Slough Skin/Subcutaneous Tissue] Level:  [1:9.75] [2:N/A] [3:10.2] Debridement A (sq cm): [1:rea Curette] [2:N/A] [3:Curette] Instrument: [1:Moderate] [  2:N/A] [3:Moderate] Bleeding: [1:Silver Nitrate] [2:N/A] [3:Silver Nitrate] Hemostasis Achieved: [1:0] [2:N/A] [3:0] Procedural Pain: [1:0] [2:N/A] [3:0] Post Procedural Pain: Debridement Treatment Response: Procedure was tolerated well [2:N/A] [3:Procedure was tolerated well] Post Debridement Measurements L x 3.9x2.5x0.2 [2:N/A] [3:3.4x3x0.2] W x D (cm) [1:1.532] [2:N/A] [3:1.602] Post Debridement Volume: (cm) [1:Unstageable/Unclassified] [2:N/A] [3:Unstageable/Unclassified] Post Debridement Stage: [1:Compression Therapy] [2:Compression Therapy] [3:Compression Therapy] Procedures Performed: [1:Debridement] [2:4 N/A] [3:Debridement N/A] Photos: [1:No Photos Left, Lateral Lower Leg] [2:N/A N/A] [3:N/A N/A] Wound Location: [1:Pressure Injury] [2:N/A] [3:N/A] Wounding Event: [1:Pressure Ulcer] [2:N/A] [3:N/A] Primary Etiology: [1:Hypotension, Type II Diabetes] [2:N/A] [3:N/A] Comorbid History: [1:08/12/2019] [2:N/A] [3:N/A] Date Acquired: [1:2] [2:N/A] [3:N/A] Weeks of Treatment: [1:Open] [2:N/A] [3:N/A] Wound Status: [1:2x1x0.2] [2:N/A] [3:N/A] Measurements L x W x D (cm) [1:1.571] [2:N/A] [3:N/A] A (cm) : rea [1:0.314] [2:N/A] [3:N/A] Volume (cm) : [1:46.70%] [2:N/A] [3:N/A] % Reduction in A rea: [1:-6.40%] [2:N/A] [3:N/A] % Reduction in Volume: [1:Unstageable/Unclassified] [2:N/A] [3:N/A] Classification: [1:Small] [2:N/A] [3:N/A] Exudate A mount: [1:Serous] [2:N/A] [3:N/A] Exudate Type: [1:amber] [2:N/A] [3:N/A] Exudate Color: [1:Distinct, outline attached] [2:N/A] [3:N/A] Wound Margin: [1:Small (1-33%)] [2:N/A] [3:N/A] Granulation A mount: [1:Red] [2:N/A] [3:N/A] Granulation Quality: [1:Large (67-100%)] [2:N/A] [3:N/A] Necrotic A mount: [1:Eschar, Adherent Slough] [2:N/A] [3:N/A] Necrotic Tissue: [1:Fat Layer (Subcutaneous Tissue): Yes N/A] [3:N/A] Exposed  Structures: [1:Fascia: No Tendon: No Muscle: No Joint: No Bone: No None] [2:N/A] [3:N/A] Epithelialization: [1:Debridement - Excisional] [2:N/A] [3:N/A] Debridement: Pre-procedure Verification/Time Out 13:28 [2:N/A] [3:N/A] Taken: [1:Lidocaine 4% T opical Solution] [2:N/A] [3:N/A] Pain Control: [1:Necrotic/Eschar, Subcutaneous,] [2:N/A] [3:N/A] Tissue Debrided: [1:Slough Skin/Subcutaneous Tissue] [2:N/A] [3:N/A] Level: [1:2] [2:N/A] [3:N/A] Debridement A (sq cm): [1:rea Curette] [2:N/A] [3:N/A] Instrument: [1:Moderate] [2:N/A] [3:N/A] Bleeding: [1:Silver Nitrate] [2:N/A] [3:N/A] Hemostasis Achieved: [1:0] [2:N/A] [3:N/A] Procedural Pain: [1:0] [2:N/A] [3:N/A] Post Procedural Pain: Debridement Treatment Response: Procedure was tolerated well [2:N/A] [3:N/A] Post Debridement Measurements L x 2x1x0.2 [2:N/A] [3:N/A] W x D (cm) [1:0.314] [2:N/A] [3:N/A] Post Debridement Volume: (cm) [1:Unstageable/Unclassified] [2:N/A] [3:N/A] Post Debridement Stage: [1:Compression Therapy] [2:N/A] [3:N/A] Procedures Performed: [1:Debridement] Treatment Notes Electronic Signature(s) Signed: 10/21/2019 5:40:39 PM By: Shawn Stall Signed: 10/22/2019 1:37:47 PM By: Baltazar Najjar MD Entered By: Baltazar Najjar on 10/21/2019 14:04:58 -------------------------------------------------------------------------------- Multi-Disciplinary Care Plan Details Patient Name: Date of Service: [name], [name] 10/21/2019 1:15 PM Medical Record Number: [id number] Patient Account Number: [id number] Date of Birth/Sex: Treating RN: Mar 16, 1965 (54 y.o. [name] Primary Care Tzvi Economou: [name] Other Clinician: Referring Rollyn Scialdone: Treating Kage Willmann/Extender: [name] in Treatment: 2 Active Inactive Nutrition Nursing Diagnoses: Impaired glucose control: actual or potential Goals: Patient/caregiver verbalizes understanding of need to maintain therapeutic glucose control  per primary care physician Date Initiated: 10/01/2019 Target Resolution Date: 11/05/2019 Goal Status: Active Interventions: Provide education on elevated blood sugars and impact on wound healing Notes: Pressure Nursing Diagnoses: Knowledge deficit related to causes and risk factors for pressure ulcer development Goals: Patient/caregiver will verbalize risk factors for pressure ulcer development Date Initiated: 10/01/2019 Target Resolution Date: 11/05/2019 Goal Status: Active Interventions: Provide education on pressure ulcers Notes: Wound/Skin Impairment Nursing Diagnoses: Impaired tissue integrity Goals: Ulcer/skin breakdown will have a volume reduction of 50% by week 8 Date Initiated: 10/01/2019 Target Resolution Date: 11/05/2019 Goal Status: Active Interventions: Provide education on ulcer and skin care Notes: Electronic Signature(s) Signed: 10/21/2019 5:40:39 PM By: Shawn Stall Entered By: Shawn Stall on 10/21/2019 17:24:06 -------------------------------------------------------------------------------- Pain Assessment Details Patient Name: Date of Service: [name] 10/21/2019 1:15 PM Medical Record Number: [id number] Patient Account Number: [id number] Date of Birth/Sex:  Treating RN: 20-Jul-1965 (53 y.o. [name] Primary Care Carmelia Tiner: [name] Other Clinician: Referring Brissa Asante: Treating Maudene Stotler/Extender: [name] in Treatment: 2 Active Problems Location of Pain Severity and Description of Pain Patient Has Paino No Site Locations Pain Management and Medication Current Pain Management: Electronic Signature(s) Signed: 10/21/2019 5:17:55 PM By: Cherylin Mylar Entered By: Cherylin Mylar on 10/21/2019 13:05:32 -------------------------------------------------------------------------------- Patient/Caregiver Education Details Patient Name: Date of Service: [name] 9/9/2021andnbsp1:15  PM Medical Record Number: [id number] Patient Account Number: [id number] Date of Birth/Gender: Treating RN: 1965-10-24 (54 y.o. [name] Primary Care Physician: [name] Other Clinician: Referring Physician: Treating Physician/Extender: [name] in Treatment: 2 Education Assessment Education Provided To: Patient Education Topics Provided Wound/Skin Impairment: Handouts: Skin Care Do's and Dont's Methods: Explain/Verbal Responses: Reinforcements needed Electronic Signature(s) Signed: 10/21/2019 5:40:39 PM By: Shawn Stall Entered By: Shawn Stall on 10/21/2019 17:24:17 -------------------------------------------------------------------------------- Wound Assessment Details Patient Name: Date of Service: [name] 10/21/2019 1:15 PM Medical Record Number: [id number] Patient Account Number: [id number] Date of Birth/Sex: Treating RN: 09/02/1965 (54 y.o. [name] Primary Care Kimila Papaleo: [name] Other Clinician: Referring Weslynn Ke: Treating Kalysta Kneisley/Extender: [name] in Treatment: 2 Wound Status Wound Number: 1 Primary Etiology: Pressure Ulcer Wound Location: Right Metatarsal head fifth Wound Status: Open Wounding Event: Gradually Appeared Comorbid History: Hypotension, Type II Diabetes Date Acquired: 08/12/2019 Weeks Of Treatment: 2 Clustered Wound: No Photos Photo Uploaded By: Benjaman Kindler on 10/22/2019 12:06:41 Wound Measurements Length: (cm) 3.9 Width: (cm) 2.5 Depth: (cm) 0.1 Area: (cm) 7.658 Volume: (cm) 0.766 % Reduction in Area: -79.2% % Reduction in Volume: -79.4% Epithelialization: None Tunneling: No Undermining: No Wound Description Classification: Unstageable/Unclassified Wound Margin: Distinct, outline attached Exudate Amount: None Present Foul Odor After Cleansing: No Slough/Fibrino No Wound Bed Granulation Amount: None Present (0%)  Exposed Structure Necrotic Amount: Large (67-100%) Fascia Exposed: No Necrotic Quality: Eschar Fat Layer (Subcutaneous Tissue) Exposed: No Tendon Exposed: No Muscle Exposed: No Joint Exposed: No Bone Exposed: No Treatment Notes Wound #1 (Right Metatarsal head fifth) 2. Periwound Care Moisturizing lotion 3. Primary Dressing Applied Hydrogel or K-Y Jelly Other primary dressing (specifiy in notes) 4. Secondary Dressing ABD Pad Dry Gauze Heel Cup 6. Support Layer Applied 3 layer compression wrap Electronic Signature(s) Signed: 10/21/2019 5:17:55 PM By: Cherylin Mylar Entered By: Cherylin Mylar on 10/21/2019 13:13:02 -------------------------------------------------------------------------------- Wound Assessment Details Patient Name: Date of Service: [name], Sherlyn Lees 10/21/2019 1:15 PM Medical Record Number: [id number] Patient Account Number: [id number] Date of Birth/Sex: Treating RN: 10/01/1965 (54 y.o. [name] Primary Care Emmali Karow: [name] Other Clinician: Referring Jerrico Covello: Treating Calin Ellery/Extender: [name] in Treatment: 2 Wound Status Wound Number: 2 Primary Etiology: Pressure Ulcer Wound Location: Right Calcaneus Wound Status: Open Wounding Event: Gradually Appeared Comorbid History: Hypotension, Type II Diabetes Date Acquired: 08/12/2019 Weeks Of Treatment: 2 Clustered Wound: No Photos Photo Uploaded By: Benjaman Kindler on 10/22/2019 12:07:34 Wound Measurements Length: (cm) 0.5 Width: (cm) 2.5 Depth: (cm) 0.1 Area: (cm) 0.982 Volume: (cm) 0.098 % Reduction in Area: 37.5% % Reduction in Volume: 68.8% Epithelialization: None Tunneling: No Undermining: No Wound Description Classification: Unstageable/Unclassified Wound Margin: Distinct, outline attached Exudate Amount: Medium Exudate Type: Serosanguineous Exudate Color: red, brown Foul Odor After Cleansing: No Slough/Fibrino Yes Wound  Bed Granulation Amount: Small (1-33%) Exposed Structure Granulation Quality: Pink Fascia Exposed: No Necrotic Amount: Large (67-100%) Fat Layer (Subcutaneous Tissue) Exposed: No Necrotic Quality: Eschar, Adherent  Slough Tendon Exposed: No Muscle Exposed: No Joint Exposed: No Bone Exposed: No Treatment Notes Wound #2 (Right Calcaneus) 2. Periwound Care Moisturizing lotion 3. Primary Dressing Applied Hydrogel or K-Y Jelly Other primary dressing (specifiy in notes) 4. Secondary Dressing ABD Pad Dry Gauze Heel Cup 6. Support Layer Applied 3 layer compression wrap Electronic Signature(s) Signed: 10/21/2019 5:17:55 PM By: Cherylin Mylarwiggins, Shannon Entered By: Cherylin Mylarwiggins, Shannon on 10/21/2019 13:11:16 -------------------------------------------------------------------------------- Wound Assessment Details Patient Name: Date of Service: Harry ChurchFA CUNDO, [name] 10/21/2019 1:15 PM Medical Record Number: [id number] Patient Account Number: [id number] Date of Birth/Sex: Treating RN: 1965/05/14 (54 y.o. Katherina RightM) Dwiggins, Shannon Primary Care Daneisha Surges: Gwinda PasseEdwards, Michelle Other Clinician: Referring Yamira Papa: Treating Fendi Meinhardt/Extender: Aurelio Brashobson, Michael Edwards, Michelle Weeks in Treatment: 2 Wound Status Wound Number: 3 Primary Etiology: Pressure Ulcer Wound Location: Left Metatarsal head fifth Wound Status: Open Wounding Event: Gradually Appeared Comorbid History: Hypotension, Type II Diabetes Date Acquired: 08/12/2019 Weeks Of Treatment: 2 Clustered Wound: No Photos Photo Uploaded By: Benjaman KindlerJones, Dedrick on 10/22/2019 12:07:34 Wound Measurements Length: (cm) 3.4 Width: (cm) 3 Depth: (cm) 0.1 Area: (cm) 8.011 Volume: (cm) 0.801 % Reduction in Area: -41.7% % Reduction in Volume: -41.8% Epithelialization: None Tunneling: No Undermining: No Wound Description Classification: Unstageable/Unclassified Wound Margin: Distinct, outline attached Exudate Amount: None Present Foul Odor After Cleansing:  No Slough/Fibrino No Wound Bed Granulation Amount: None Present (0%) Exposed Structure Necrotic Amount: Large (67-100%) Fascia Exposed: No Necrotic Quality: Eschar Fat Layer (Subcutaneous Tissue) Exposed: No Tendon Exposed: No Muscle Exposed: No Joint Exposed: No Bone Exposed: No Treatment Notes Wound #3 (Left Metatarsal head fifth) 2. Periwound Care Moisturizing lotion 3. Primary Dressing Applied Hydrogel or K-Y Jelly Other primary dressing (specifiy in notes) 4. Secondary Dressing ABD Pad Dry Gauze Heel Cup 6. Support Layer Applied 3 layer compression wrap Electronic Signature(s) Signed: 10/21/2019 5:17:55 PM By: Cherylin Mylarwiggins, Shannon Entered By: Cherylin Mylarwiggins, Shannon on 10/21/2019 13:11:51 -------------------------------------------------------------------------------- Wound Assessment Details Patient Name: Date of Service: Harry ChurchFA CUNDO, [name] 10/21/2019 1:15 PM Medical Record Number: [id number] Patient Account Number: [id number] Date of Birth/Sex: Treating RN: 1965/05/14 (54 y.o. Katherina RightM) Dwiggins, Shannon Primary Care Preslea Rhodus: Gwinda PasseEdwards, Michelle Other Clinician: Referring Shahiem Bedwell: Treating Bernis Stecher/Extender: Aurelio Brashobson, Michael Edwards, Michelle Weeks in Treatment: 2 Wound Status Wound Number: 4 Primary Etiology: Pressure Ulcer Wound Location: Left, Lateral Lower Leg Wound Status: Open Wounding Event: Pressure Injury Comorbid History: Hypotension, Type II Diabetes Date Acquired: 08/12/2019 Weeks Of Treatment: 2 Clustered Wound: No Photos Photo Uploaded By: Benjaman KindlerJones, Dedrick on 10/22/2019 12:06:41 Wound Measurements Length: (cm) 2 Width: (cm) 1 Depth: (cm) 0.2 Area: (cm) 1.571 Volume: (cm) 0.314 % Reduction in Area: 46.7% % Reduction in Volume: -6.4% Epithelialization: None Tunneling: No Undermining: No Wound Description Classification: Unstageable/Unclassified Wound Margin: Distinct, outline attached Exudate Amount: Small Exudate Type: Serous Exudate Color:  amber Foul Odor After Cleansing: No Slough/Fibrino Yes Wound Bed Granulation Amount: Small (1-33%) Exposed Structure Granulation Quality: Red Fascia Exposed: No Necrotic Amount: Large (67-100%) Fat Layer (Subcutaneous Tissue) Exposed: Yes Necrotic Quality: Eschar, Adherent Slough Tendon Exposed: No Muscle Exposed: No Joint Exposed: No Bone Exposed: No Treatment Notes Wound #4 (Left, Lateral Lower Leg) 2. Periwound Care Moisturizing lotion 3. Primary Dressing Applied Hydrogel or K-Y Jelly Other primary dressing (specifiy in notes) 4. Secondary Dressing ABD Pad Dry Gauze Heel Cup 6. Support Layer Applied 3 layer compression wrap Electronic Signature(s) Signed: 10/21/2019 5:17:55 PM By: Cherylin Mylarwiggins, Shannon Entered By: Cherylin Mylarwiggins, Shannon on 10/21/2019 13:12:31 -------------------------------------------------------------------------------- Vitals Details Patient Name: Date of Service: Harry MoroFA CUNDO, [name] 10/21/2019  1:15 PM Medical Record Number: [id number] Patient Account Number: [id number] Date of Birth/Sex: Treating RN: November 13, 1965 (54 y.o. [name] Primary Care Margaux Engen: [name] Other Clinician: Referring Meer Reindl: Treating Emoni Whitworth/Extender: [name] in Treatment: 2 Vital Signs Time Taken: 12:59 Temperature (F): 98.4 Height (in): 68 Pulse (bpm): 76 Weight (lbs): 235 Respiratory Rate (breaths/min): 18 Body Mass Index (BMI): 35.7 Blood Pressure (mmHg): 127/69 Reference Range: 80 - 120 mg / dl Electronic Signature(s) Signed: 10/21/2019 5:17:55 PM By: Cherylin Mylar Entered By: Cherylin Mylar on 10/21/2019 13:05:25

## 2019-10-29 ENCOUNTER — Encounter (HOSPITAL_BASED_OUTPATIENT_CLINIC_OR_DEPARTMENT_OTHER): Payer: Medicaid Other | Admitting: Internal Medicine

## 2019-10-29 DIAGNOSIS — E11621 Type 2 diabetes mellitus with foot ulcer: Secondary | ICD-10-CM | POA: Diagnosis not present

## 2019-11-01 NOTE — Progress Notes (Signed)
LEEVI, CULLARS ([id number]) Visit Report for 10/29/2019 Debridement Details Patient Name: Date of Service: [name] 10/29/2019 1:15 PM Medical Record Number: [id number] Patient Account Number: [id number] Date of Birth/Sex: Treating RN: 1965-10-05 (54 y.o. Marvis Repress Primary Care Provider: Juluis Mire Other Clinician: Referring Provider: Treating Provider/Extender: Fleet Contras in Treatment: 4 Debridement Performed for Assessment: Wound #1 Right Metatarsal head fifth Performed By: Physician [name]., MD Debridement Type: Debridement Level of Consciousness (Pre-procedure): Awake and Alert Pre-procedure Verification/Time Out Yes - 13:40 Taken: Start Time: 13:40 Pain Control: Other : benzocaine, 20% T Area Debrided (L x W): otal 3.3 (cm) x 2.1 (cm) = 6.93 (cm) Tissue and other material debrided: Viable, Non-Viable, Eschar, Subcutaneous Level: Skin/Subcutaneous Tissue Debridement Description: Excisional Instrument: Curette Bleeding: Moderate Hemostasis Achieved: Pressure End Time: 13:42 Procedural Pain: 0 Post Procedural Pain: 0 Response to Treatment: Procedure was tolerated well Level of Consciousness (Post- Awake and Alert procedure): Post Debridement Measurements of Total Wound Length: (cm) 3.3 Stage: Unstageable/Unclassified Width: (cm) 2.1 Depth: (cm) 0.1 Volume: (cm) 0.544 Character of Wound/Ulcer Post Debridement: Requires Further Debridement Post Procedure Diagnosis Same as Pre-procedure Electronic Signature(s) Signed: 11/01/2019 8:08:43 AM By: Linton Ham MD Signed: 11/01/2019 1:33:30 PM By: Kela Millin Entered By: Linton Ham on 10/29/2019 14:53:27 -------------------------------------------------------------------------------- Debridement Details Patient Name: Date of Service: [name], [name] 10/29/2019 1:15 PM Medical Record Number: [id number] Patient Account Number: [id number] Date  of Birth/Sex: Treating RN: 1965-05-09 (54 y.o. Marvis Repress Primary Care Provider: Juluis Mire Other Clinician: Referring Provider: Treating Provider/Extender: Fleet Contras in Treatment: 4 Debridement Performed for Assessment: Wound #3 Left Metatarsal head fifth Performed By: Physician [name]., MD Debridement Type: Debridement Level of Consciousness (Pre-procedure): Awake and Alert Pre-procedure Verification/Time Out Yes - 13:40 Taken: Start Time: 13:40 Pain Control: Other : benzocaine, 20% T Area Debrided (L x W): otal 3.6 (cm) x 2.5 (cm) = 9 (cm) Tissue and other material debrided: Viable, Non-Viable, Eschar, Subcutaneous Level: Skin/Subcutaneous Tissue Debridement Description: Excisional Instrument: Curette Bleeding: Moderate Hemostasis Achieved: Pressure End Time: 13:42 Procedural Pain: 0 Post Procedural Pain: 0 Response to Treatment: Procedure was tolerated well Level of Consciousness (Post- Awake and Alert procedure): Post Debridement Measurements of Total Wound Length: (cm) 3.6 Stage: Unstageable/Unclassified Width: (cm) 2.5 Depth: (cm) 0.1 Volume: (cm) 0.707 Character of Wound/Ulcer Post Debridement: Requires Further Debridement Post Procedure Diagnosis Same as Pre-procedure Electronic Signature(s) Signed: 11/01/2019 8:08:43 AM By: Linton Ham MD Signed: 11/01/2019 1:33:30 PM By: Kela Millin Entered By: Linton Ham on 10/29/2019 14:53:38 -------------------------------------------------------------------------------- Debridement Details Patient Name: Date of Service: [name], [name] 10/29/2019 1:15 PM Medical Record Number: [id number] Patient Account Number: [id number] Date of Birth/Sex: Treating RN: [birth year] (54 y.o. Marvis Repress Primary Care Provider: Juluis Mire Other Clinician: Referring Provider: Treating Provider/Extender: Fleet Contras in  Treatment: 4 Debridement Performed for Assessment: Wound #4 Left,Lateral Lower Leg Performed By: Physician [name]., MD Debridement Type: Debridement Level of Consciousness (Pre-procedure): Awake and Alert Pre-procedure Verification/Time Out Yes - 13:40 Taken: Start Time: 13:40 Pain Control: Other : benzocaine, 20% T Area Debrided (L x W): otal 2 (cm) x 0.7 (cm) = 1.4 (cm) Tissue and other material debrided: Viable, Non-Viable, Eschar, Subcutaneous Level: Skin/Subcutaneous Tissue Debridement Description: Excisional Instrument: Curette Bleeding: Moderate Hemostasis Achieved: Pressure End Time: 13:42 Procedural Pain: 0 Post Procedural Pain: 0 Response to Treatment: Procedure was tolerated well Level of Consciousness (Post- Awake and Alert procedure):  Post Debridement Measurements of Total Wound Length: (cm) 2 Stage: Unstageable/Unclassified Width: (cm) 0.7 Depth: (cm) 0.1 Volume: (cm) 0.11 Character of Wound/Ulcer Post Debridement: Requires Further Debridement Post Procedure Diagnosis Same as Pre-procedure Electronic Signature(s) Signed: 11/01/2019 8:08:43 AM By: Linton Ham MD Signed: 11/01/2019 1:33:30 PM By: Kela Millin Entered By: Linton Ham on 10/29/2019 14:53:49 -------------------------------------------------------------------------------- HPI Details Patient Name: Date of Service: [name], [name] 10/29/2019 1:15 PM Medical Record Number: [id number] Patient Account Number: [id number] Date of Birth/Sex: Treating RN: 12/05/1965 (54 y.o. Marvis Repress Primary Care Provider: Juluis Mire Other Clinician: Referring Provider: Treating Provider/Extender: Fleet Contras in Treatment: 4 History of Present Illness HPI Description: ADMISSION 10/02/2018 This is a 54 year old Spanish-speaking man who arrived accompanied by his wife. 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 Apparently 6 weeks ago his wife noted blisters on his bilateral fifth metatarsal heads and heels which she feels was from excessive pressure on the foot rests of his wheelchair. He was seen in the emergency room early in August and then was admitted to hospital from 8/10 through 8/14 with cellulitis of his feet. He was ultimately discharged on Keflex. Arterial studies were done in the hospital that showed an ABI of 1.61 on the right and 1.36 on the left but triphasic waveforms on the left and biphasic on the right. DVT rule out study was negative. X-ray of the bilateral feet did not show osteomyelitis. They have been dealing with these wounds at home. I think they are using Betadine. He has Medicaid and does not have home health. He comes in today with wounds on his bilateral plantar fifth metatarsal heads Achilles area of both heels and an area on the left lateral leg. His wife explains this as he always laterally rotates his legs when he is sitting in the wheelchair or in bed. She even seemed to believe that before his injury he actually walked on the outside of his feet. 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 Paraplegia T1-T6 8/27; the patient has 4 open wounds mirror-image areas over the plantar fifth metatarsal heads. Area on the right Achilles area and then on the left lateral calf. We have been using Iodoflex. He is on Eliquis 9/2 debridement I did last week caused copious amounts of bleeding which was difficult to control. He is on Eliquis. He has the 2 mirror-image wounds over the plantar fifth metatarsal heads in the area on the right Achilles and then an area on the left lateral calf. Both the fifth metatarsal head wounds on the left lateral calf of necrotic eschar on the surface. Black discoloration likely partially the silver nitrate we had to use last  time 9/9; he arrives back in clinic today with the wounds on the plantar fifth metatarsal heads and exactly the same nonviable situation. He also has an area on the Achilles part of his right heel and the left lateral leg in a very similar situation. Nothing is making much progress I took some time to review his arterial studies from his hospitalization before he came to this clinic. On the right he had noncompressible ABIs with biphasic waveforms. They did not do a TBI in the right. On the left he had noncompressible ABIs at 1.36. However his TBI was 0.70 with triphasic waveforms at the dorsalis pedis he had a  DVT rule out study that was negative. We have been using Iodoflex without much progress back in compression as he does not have access to wound care supplies 9/17; he comes back in with the same adherent eschar over both fifth met heads. I am really uncertain about this.. I used Iodoflex and last week switch to Sorbact. The area on the left posterior calf still with adherent debris. The right heel looked better Electronic Signature(s) Signed: 11/01/2019 8:08:43 AM By: Linton Ham MD Entered By: Linton Ham on 10/29/2019 14:54:49 -------------------------------------------------------------------------------- Physical Exam Details Patient Name: Date of Service: [name], [name] 10/29/2019 1:15 PM Medical Record Number: [id number] Patient Account Number: [id number] Date of Birth/Sex: Treating RN: 1966-01-30 (54 y.o. Marvis Repress Primary Care Provider: Other Clinician: Juluis Mire Referring Provider: Treating Provider/Extender: Fleet Contras in Treatment: 4 Constitutional Sitting or standing Blood Pressure is within target range for patient.. Pulse regular and within target range for patient.Marland Kitchen Respirations regular, non-labored and within target range.. Temperature is normal and within the target range for the patient.Marland Kitchen Appears in no  distress. Cardiovascular Dorsalis pedis pulses are palpable. Integumentary (Hair, Skin) No overt infection around any wound area. Notes Wound exam Mirror-image wounds of the plantar fifth metatarsal heads. Again an aggressive debridement with a #5 curette I am able to get this to red healthy bleeding tissue but we have been unable to maintain it. I removed eschar nonviable subcutaneous tissue The area on the Achilles part of his heel looks smaller healthy surface no debridement here Still black eschar over the left lateral calf. This was also removed with a #5 curette also subcutaneous tissue Electronic Signature(s) Signed: 11/01/2019 8:08:43 AM By: Linton Ham MD Entered By: Linton Ham on 10/29/2019 14:56:16 -------------------------------------------------------------------------------- Physician Orders Details Patient Name: Date of Service: [name], [name] 10/29/2019 1:15 PM Medical Record Number: [id number] Patient Account Number: [id number] Date of Birth/Sex: Treating RN: Jun 21, 1965 (54 y.o. Marvis Repress Primary Care Provider: Juluis Mire Other Clinician: Referring Provider: Treating Provider/Extender: Fleet Contras in Treatment: 4 Verbal / Phone Orders: No Diagnosis Coding ICD-10 Coding Code Description L97.518 Non-pressure chronic ulcer of other part of right foot with other specified severity L97.528 Non-pressure chronic ulcer of other part of left foot with other specified severity L89.613 Pressure ulcer of right heel, stage 3 L89.623 Pressure ulcer of left heel, stage 3 E11.621 Type 2 diabetes mellitus with foot ulcer L97.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 G82.21 Paraplegia, complete Follow-up Appointments Return Appointment in 1 week. Dressing Change Frequency Do not change entire dressing for one week. - all wounds Skin Barriers/Peri-Wound Care Moisturizing lotion - both  legs. Wound Cleansing May shower with protection. - Dressing cannot get wet. Use cast protector if taking a shower Primary Wound Dressing Wound #1 Right Metatarsal head fifth Cutimed Sorbact - with hydrogel. Wound #2 Right Calcaneus Cutimed Sorbact - with hydrogel. Wound #3 Left Metatarsal head fifth Cutimed Sorbact - with hydrogel. Wound #4 Left,Lateral Lower Leg Cutimed Sorbact - with hydrogel. Secondary Dressing Wound #1 Right Metatarsal head fifth Dry Gauze Wound #2 Right Calcaneus Dry Gauze Heel Cup Wound #3 Left Metatarsal head fifth Dry Gauze Wound #4 Left,Lateral Lower Leg Dry Gauze ABD pad Edema Control 3 Layer Compression System - Bilateral Elevate legs to the level of the heart or above for 30 minutes daily and/or when sitting, a frequency of: - throughout the day Off-Loading Other: - avoid pressure to sites. Electronic  Signature(s) Signed: 11/01/2019 8:08:43 AM By: Linton Ham MD Signed: 11/01/2019 1:33:30 PM By: Kela Millin Entered By: Kela Millin on 10/29/2019 13:49:04 -------------------------------------------------------------------------------- Problem List Details Patient Name: Date of Service: [name], [name] 10/29/2019 1:15 PM Medical Record Number: [id number] Patient Account Number: [id number] Date of Birth/Sex: Treating RN: 27-Mar-1965 (54 y.o. Marvis Repress Primary Care Provider: Juluis Mire Other Clinician: Referring Provider: Treating Provider/Extender: Fleet Contras in Treatment: 4 Active Problems ICD-10 Encounter Code Description Active Date MDM Diagnosis L97.518 Non-pressure chronic ulcer of other part of right foot with other specified 10/01/2019 No Yes severity L97.528 Non-pressure chronic ulcer of other part of left foot with other specified 10/01/2019 No Yes severity L89.613 Pressure ulcer of right heel, stage 3 10/01/2019 No Yes L89.623 Pressure ulcer of left heel, stage 3  10/01/2019 No Yes E11.621 Type 2 diabetes mellitus with foot ulcer 10/01/2019 No Yes L97.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 10/01/2019 No Yes G82.21 Paraplegia, complete 10/01/2019 No Yes Inactive Problems Resolved Problems Electronic Signature(s) Signed: 11/01/2019 8:08:43 AM By: Linton Ham MD Entered By: Linton Ham on 10/29/2019 14:53:07 -------------------------------------------------------------------------------- Progress Note Details Patient Name: Date of Service: [name], [name] 10/29/2019 1:15 PM Medical Record Number: [id number] Patient Account Number: [id number] Date of Birth/Sex: Treating RN: [birth year] (54 y.o. Marvis Repress Primary Care Provider: Juluis Mire Other Clinician: Referring Provider: Treating Provider/Extender: Fleet Contras in Treatment: 4 Subjective History of Present Illness (HPI) ADMISSION 10/02/2018 This is a 54 year old Spanish-speaking man who arrived accompanied by his wife. 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 Apparently 6 weeks ago his wife noted blisters on his bilateral fifth metatarsal heads and heels which she feels was from excessive pressure on the foot rests of his wheelchair. He was seen in the emergency room early in August and then was admitted to hospital from 8/10 through 8/14 with cellulitis of his feet. He was ultimately discharged on Keflex. Arterial studies were done in the hospital that showed an ABI of 1.61 on the right and 1.36 on the left but triphasic waveforms on the left and biphasic on the right. DVT rule out study was negative. X-ray of the bilateral feet did not show osteomyelitis. They have been dealing with these wounds at home. I think they are using Betadine. He has Medicaid and does not have home health. He comes in today with wounds on his bilateral  plantar fifth metatarsal heads Achilles area of both heels and an area on the left lateral leg. His wife explains this as he always laterally rotates his legs when he is sitting in the wheelchair or in bed. She even seemed to believe that before his injury he actually walked on the outside of his feet. 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 Paraplegia T1-T6 8/27; the patient has 4 open wounds mirror-image areas over the plantar fifth metatarsal heads. Area on the right Achilles area and then on the left lateral calf. We have been using Iodoflex. He is on Eliquis 9/2 debridement I did last week caused copious amounts of bleeding which was difficult to control. He is on Eliquis. He has the 2 mirror-image wounds over the plantar fifth metatarsal heads in the area on the right Achilles and then an area on the left lateral calf. Both the  fifth metatarsal head wounds on the left lateral calf of necrotic eschar on the surface. Black discoloration likely partially the silver nitrate we had to use last time 9/9; he arrives back in clinic today with the wounds on the plantar fifth metatarsal heads and exactly the same nonviable situation. He also has an area on the Achilles part of his right heel and the left lateral leg in a very similar situation. Nothing is making much progress I took some time to review his arterial studies from his hospitalization before he came to this clinic. On the right he had noncompressible ABIs with biphasic waveforms. They did not do a TBI in the right. On the left he had noncompressible ABIs at 1.36. However his TBI was 0.70 with triphasic waveforms at the dorsalis pedis he had a DVT rule out study that was negative. We have been using Iodoflex without much progress back in compression as he does not have access to wound care supplies 9/17; he comes back in with the same adherent eschar over both fifth  met heads. I am really uncertain about this.. I used Iodoflex and last week switch to Sorbact. The area on the left posterior calf still with adherent debris. The right heel looked better Objective Constitutional Sitting or standing Blood Pressure is within target range for patient.. Pulse regular and within target range for patient.Marland Kitchen Respirations regular, non-labored and within target range.. Temperature is normal and within the target range for the patient.Marland Kitchen Appears in no distress. Vitals Time Taken: 1:22 PM, Height: 68 in, Weight: 235 lbs, BMI: 35.7, Temperature: 97.6 F, Pulse: 73 bpm, Respiratory Rate: 16 breaths/min, Blood Pressure: 103/67 mmHg. Cardiovascular Dorsalis pedis pulses are palpable. General Notes: Wound exam ooMirror-image wounds of the plantar fifth metatarsal heads. Again an aggressive debridement with a #5 curette I am able to get this to red healthy bleeding tissue but we have been unable to maintain it. I removed eschar nonviable subcutaneous tissue ooThe area on the Achilles part of his heel looks smaller healthy surface no debridement here ooStill black eschar over the left lateral calf. This was also removed with a #5 curette also subcutaneous tissue Integumentary (Hair, Skin) No overt infection around any wound area. Wound #1 status is Open. Original cause of wound was Gradually Appeared. The wound is located on the Right Metatarsal head fifth. The wound measures 3.3cm length x 2.1cm width x 0.1cm depth; 5.443cm^2 area and 0.544cm^3 volume. There is no tunneling or undermining noted. There is a none present amount of drainage noted. The wound margin is distinct with the outline attached to the wound base. There is no granulation within the wound bed. There is a large (67- 100%) amount of necrotic tissue within the wound bed including Eschar. Wound #2 status is Open. Original cause of wound was Gradually Appeared. The wound is located on the Right Calcaneus. The  wound measures 0.5cm length x 1.5cm width x 0.1cm depth; 0.589cm^2 area and 0.059cm^3 volume. There is no tunneling or undermining noted. There is a medium amount of serosanguineous drainage noted. The wound margin is distinct with the outline attached to the wound base. There is small (1-33%) pink granulation within the wound bed. There is a large (67-100%) amount of necrotic tissue within the wound bed including Eschar and Adherent Slough. Wound #3 status is Open. Original cause of wound was Gradually Appeared. The wound is located on the Left Metatarsal head fifth. The wound measures 3.6cm length x 2.5cm width x 0.1cm depth; 7.069cm^2 area  and 0.707cm^3 volume. There is no tunneling or undermining noted. There is a none present amount of drainage noted. The wound margin is distinct with the outline attached to the wound base. There is no granulation within the wound bed. There is a large (67- 100%) amount of necrotic tissue within the wound bed including Eschar. Wound #4 status is Open. Original cause of wound was Pressure Injury. The wound is located on the Left,Lateral Lower Leg. The wound measures 2cm length x 0.7cm width x 0.1cm depth; 1.1cm^2 area and 0.11cm^3 volume. There is Fat Layer (Subcutaneous Tissue) exposed. There is no tunneling or undermining noted. There is a small amount of serous drainage noted. The wound margin is distinct with the outline attached to the wound base. There is small (1-33%) red granulation within the wound bed. There is a large (67-100%) amount of necrotic tissue within the wound bed including Eschar and Adherent Slough. 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 There was a Excisional Skin/Subcutaneous Tissue Debridement with a total area of 6.93 sq cm performed by [name]., MD. With the following instrument(s): Curette to remove Viable and Non-Viable tissue/material. Material removed includes Eschar and Subcutaneous Tissue and after achieving pain control using Other (benzocaine, 20%). No specimens were taken. A time out was conducted at 13:40, prior to the start of the procedure. A Moderate amount of bleeding was controlled with Pressure. The procedure was tolerated well with a pain level of 0 throughout and a pain level of 0 following the procedure. Post Debridement Measurements: 3.3cm length x 2.1cm width x 0.1cm depth; 0.544cm^3 volume. Post debridement Stage noted as Unstageable/Unclassified. Character of Wound/Ulcer Post Debridement requires further debridement. Post procedure Diagnosis Wound #1: Same as Pre-Procedure Pre-procedure diagnosis of Wound #1 is a Pressure Ulcer located on the Right Metatarsal head fifth . There was a Three Layer Compression Therapy Procedure by Deon Pilling, RN. Post procedure Diagnosis Wound #1: Same as Pre-Procedure Wound #3 Pre-procedure diagnosis of Wound #3 is a Pressure Ulcer located on the Left Metatarsal head fifth . There was a Excisional Skin/Subcutaneous Tissue Debridement with a total area of 9 sq cm performed by [name]., MD. With the following instrument(s): Curette to remove Viable and Non-Viable tissue/material. Material removed includes Eschar and Subcutaneous Tissue and after achieving pain control using Other (benzocaine, 20%). No specimens were taken. A time out was conducted at 13:40, prior to the start of the procedure. A Moderate amount of bleeding was controlled with Pressure. The  procedure was tolerated well with a pain level of 0 throughout and a pain level of 0 following the procedure. Post Debridement Measurements: 3.6cm length x 2.5cm width x 0.1cm depth; 0.707cm^3 volume. Post debridement Stage noted as Unstageable/Unclassified. Character of Wound/Ulcer Post Debridement requires further debridement. Post procedure Diagnosis Wound #3: Same as Pre-Procedure Pre-procedure diagnosis of Wound #3 is a Pressure Ulcer located on the Left Metatarsal head fifth . There was a Three Layer Compression Therapy Procedure by Deon Pilling, RN. Post procedure Diagnosis Wound #3: Same as Pre-Procedure Wound #4 Pre-procedure  diagnosis of Wound #4 is a Pressure Ulcer located on the Left,Lateral Lower Leg . There was a Excisional Skin/Subcutaneous Tissue Debridement with a total area of 1.4 sq cm performed by [name]., MD. With the following instrument(s): Curette to remove Viable and Non-Viable tissue/material. Material removed includes Eschar and Subcutaneous Tissue and after achieving pain control using Other (benzocaine, 20%). No specimens were taken. A time out was conducted at 13:40, prior to the start of the procedure. A Moderate amount of bleeding was controlled with Pressure. The procedure was tolerated well with a pain level of 0 throughout and a pain level of 0 following the procedure. Post Debridement Measurements: 2cm length x 0.7cm width x 0.1cm depth; 0.11cm^3 volume. Post debridement Stage noted as Unstageable/Unclassified. Character of Wound/Ulcer Post Debridement requires further debridement. Post procedure Diagnosis Wound #4: Same as Pre-Procedure Pre-procedure diagnosis of Wound #4 is a Pressure Ulcer located on the Left,Lateral Lower Leg . There was a Three Layer Compression Therapy Procedure by Deon Pilling, RN. Post procedure Diagnosis Wound #4: Same as Pre-Procedure Wound #2 Pre-procedure diagnosis of Wound #2 is a Pressure Ulcer located on the Right  Calcaneus . There was a Three Layer Compression Therapy Procedure by Deon Pilling, RN. Post procedure Diagnosis Wound #2: Same as Pre-Procedure Plan Follow-up Appointments: Return Appointment in 1 week. Dressing Change Frequency: Do not change entire dressing for one week. - all wounds Skin Barriers/Peri-Wound Care: Moisturizing lotion - both legs. Wound Cleansing: May shower with protection. - Dressing cannot get wet. Use cast protector if taking a shower Primary Wound Dressing: Wound #1 Right Metatarsal head fifth: Cutimed Sorbact - with hydrogel. Wound #2 Right Calcaneus: Cutimed Sorbact - with hydrogel. Wound #3 Left Metatarsal head fifth: Cutimed Sorbact - with hydrogel. Wound #4 Left,Lateral Lower Leg: Cutimed Sorbact - with hydrogel. Secondary Dressing: Wound #1 Right Metatarsal head fifth: Dry Gauze Wound #2 Right Calcaneus: Dry Gauze Heel Cup Wound #3 Left Metatarsal head fifth: Dry Gauze Wound #4 Left,Lateral Lower Leg: Dry Gauze ABD pad Edema Control: 3 Layer Compression System - Bilateral Elevate legs to the level of the heart or above for 30 minutes daily and/or when sitting, a frequency of: - throughout the day Off-Loading: Other: - avoid pressure to sites. 1. Again we applied Sorbact to all the wound areas 2. I am puzzled about the surface on these wounds 3. I may need to repeat the vascular studies to include bilateral TBI's and consider vascular surgery referral. Electronic Signature(s) Signed: 11/01/2019 8:08:43 AM By: Linton Ham MD Entered By: Linton Ham on 10/29/2019 14:57:48 -------------------------------------------------------------------------------- SuperBill Details Patient Name: Date of Service: [name], [place] S 10/29/2019 Medical Record Number: [id number] Patient Account Number: [id number] Date of Birth/Sex: Treating RN: Dec 03, 1965 (54 y.o. 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, complete Facility Procedures CPT4 Code: [id number] Description: 11042 - DEB SUBQ TISSUE 20 SQ CM/< ICD-10 Diagnosis Description L97.518 Non-pressure chronic ulcer of other part of right foot with other specified sev L97.528 Non-pressure chronic  ulcer of other part of left foot with other specified seve  L97.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 Modifier: erity rity Quantity: 1 Physician Procedures : CPT4 Code Description Modifier [id number] 11042 - WC PHYS SUBQ TISS 20 SQ CM ICD-10 Diagnosis Description L97.518 Non-pressure chronic ulcer of other part of right foot with other specified severity L97.528 Non-pressure chronic ulcer of other part of left  foot with other specified severity L97.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 Quantity: 1 Electronic Signature(s) Signed: 11/01/2019 8:08:43 AM By: Linton Ham MD Entered By: Linton Ham on 10/29/2019 14:58:07

## 2019-11-01 NOTE — Progress Notes (Signed)
HUIE, GHUMAN ([id number]) Visit Report for 10/29/2019 Arrival Information Details Patient Name: Date of Service: [name] 10/29/2019 1:15 PM Medical Record Number: [id number] Patient Account Number: [id number] Date of Birth/Sex: Treating RN: 1965/04/07 (54 y.o. [name]) Yevonne Pax Primary Care Jauna Raczynski: [name] Other Clinician: Referring Naksh Radi: Treating Revis Whalin/Extender: [name] in Treatment: 4 Visit Information History Since Last Visit All ordered tests and consults were completed: No Patient Arrived: Wheel Chair Added or deleted any medications: No Arrival Time: 13:21 Any new allergies or adverse reactions: No Accompanied By: wife Had a fall or experienced change in No Transfer Assistance: None activities of daily living that may affect Patient Identification Verified: Yes risk of falls: Secondary Verification Process Completed: Yes Signs or symptoms of abuse/neglect since last visito No Patient Requires Transmission-Based Precautions: No Hospitalized since last visit: No Patient Has Alerts: Yes Implantable device outside of the clinic excluding No Patient Alerts: Translator Required cellular tissue based products placed in the center [name] ABI 1.61 since last visit: Left ABI 1.36 Has Dressing in Place as Prescribed: Yes Has Compression in Place as Prescribed: Yes Pain Present Now: No Electronic Signature([name]: 11/01/2019 1:15:48 PM By: Yevonne Pax RN Entered By: Yevonne Pax on 10/29/2019 13:22:16 -------------------------------------------------------------------------------- Compression Therapy Details Patient Name: Date of Service: [name] 10/29/2019 1:15 PM Medical Record Number: [id number] Patient Account Number: [id number] Date of Birth/Sex: Treating RN: [birth year] (54 y.o. [name] Primary Care Kiarah Eckstein: [name] Other Clinician: Referring Jasher Barkan: Treating  Joyous Gleghorn/Extender: [name] in Treatment: 4 Compression Therapy Performed for Wound Assessment: Wound #1 [name] Metatarsal head fifth Performed By: Clinician Shawn Stall, RN Compression Type: Three Layer Post Procedure Diagnosis Same as Pre-procedure Electronic Signature([name]: 11/01/2019 1:33:30 PM By: Cherylin Mylar Entered By: Cherylin Mylar on 10/29/2019 13:48:41 -------------------------------------------------------------------------------- Compression Therapy Details Patient Name: Date of Service: [name], [name] 10/29/2019 1:15 PM Medical Record Number: [id number] Patient Account Number: [id number] Date of Birth/Sex: Treating RN: 1965-11-26 (54 y.o. [name] Primary Care Tanish Prien: [name] Other Clinician: Referring Kaneshia Cater: Treating Bary Limbach/Extender: [name] in Treatment: 4 Compression Therapy Performed for Wound Assessment: Wound #2 [name] Calcaneus Performed By: Clinician Shawn Stall, RN Compression Type: Three Layer Post Procedure Diagnosis Same as Pre-procedure Electronic Signature([name]: 11/01/2019 1:33:30 PM By: Cherylin Mylar Entered By: Cherylin Mylar on 10/29/2019 13:48:41 -------------------------------------------------------------------------------- Compression Therapy Details Patient Name: Date of Service: [name], [name] 10/29/2019 1:15 PM Medical Record Number: [id number] Patient Account Number: [id number] Date of Birth/Sex: Treating RN: Jun 26, 1965 (54 y.o. [name] Primary Care Javarion Douty: [name] Other Clinician: Referring Kemyra August: Treating Early Ord/Extender: [name] in Treatment: 4 Compression Therapy Performed for Wound Assessment: Wound #3 Left Metatarsal head fifth Performed By: Clinician Shawn Stall, RN Compression Type: Three Layer Post Procedure Diagnosis Same as  Pre-procedure Electronic Signature([name]: 11/01/2019 1:33:30 PM By: Cherylin Mylar Entered By: Cherylin Mylar on 10/29/2019 13:48:41 -------------------------------------------------------------------------------- Compression Therapy Details Patient Name: Date of Service: [name], [name] 10/29/2019 1:15 PM Medical Record Number: [id number] Patient Account Number: [id number] Date of Birth/Sex: Treating RN: 1965/04/02 (54 y.o. [name] Primary Care Arella Blinder: [name] Other Clinician: Referring Kailo Kosik: Treating Latavious Bitter/Extender: [name] in Treatment: 4 Compression Therapy Performed for Wound Assessment: Wound #4 Left,Lateral Lower Leg Performed By: Clinician Shawn Stall, RN Compression Type: Three Layer Post Procedure Diagnosis Same as Pre-procedure Electronic Signature([name]: 11/01/2019 1:33:30 PM  By: Cherylin Mylar Entered By: Cherylin Mylar on 10/29/2019 13:48:42 -------------------------------------------------------------------------------- Encounter Discharge Information Details Patient Name: Date of Service: [name], [place] RLO [name] 10/29/2019 1:15 PM Medical Record Number: [id number] Patient Account Number: [id number] Date of Birth/Sex: Treating RN: Jan 21, 1966 (54 y.o. [name] Primary Care Destina Mantei: [name] Other Clinician: Referring Sadaf Przybysz: Treating Nikodem Leadbetter/Extender: [name] in Treatment: 4 Encounter Discharge Information Items Post Procedure Vitals Discharge Condition: Stable Temperature (F): 97.6 Ambulatory Status: Wheelchair Pulse (bpm): 73 Discharge Destination: Home Respiratory Rate (breaths/min): 16 Transportation: Private Auto Blood Pressure (mmHg): 103/67 Accompanied By: wife Schedule Follow-up Appointment: Yes Clinical Summary of Care: Electronic Signature([name]: 10/29/2019 5:49:45 PM By: Shawn Stall Entered By: Shawn Stall on 10/29/2019 15:27:33 -------------------------------------------------------------------------------- Lower Extremity Assessment Details Patient Name: Date of Service: [name] 10/29/2019 1:15 PM Medical Record Number: [id number] Patient Account Number: [id number] Date of Birth/Sex: Treating RN: 11-24-1965 (54 y.o. [name]) Yevonne Pax Primary Care Rebekkah Powless: [name] Other Clinician: Referring Brigida Scotti: Treating Greysen Devino/Extender: [name] in Treatment: 4 Edema Assessment Assessed: Kyra Searles: No] [[name]: No] E[Left: dema] [[name]: :] Calf Left: [name]: Point of Measurement: cm From Medial Instep 44 cm 44 cm Ankle Left: [name]: Point of Measurement: cm From Medial Instep 27 cm 28 cm Electronic Signature([name]: 11/01/2019 1:15:48 PM By: Yevonne Pax RN Entered By: Yevonne Pax on 10/29/2019 13:23:07 -------------------------------------------------------------------------------- Multi Wound Chart Details Patient Name: Date of Service: [name], [place] RLO [name] 10/29/2019 1:15 PM Medical Record Number: [id number] Patient Account Number: [id number] Date of Birth/Sex: Treating RN: May 30, 1965 (54 y.o. [name] Primary Care Maryjean Corpening: [name] Other Clinician: Referring Kristeen Lantz: Treating Jeraldin Fesler/Extender: [name] in Treatment: 4 Vital Signs Height(in): 68 Pulse(bpm): 73 Weight(lbs): 235 Blood Pressure(mmHg): 103/67 Body Mass Index(BMI): 36 Temperature(F): 97.6 Respiratory Rate(breaths/min): 16 Photos: [1:No Photos [name] Metatarsal head fifth] [2:No Photos [name] Calcaneus] [3:No Photos Left Metatarsal head fifth] Wound Location: [1:Gradually Appeared] [2:Gradually Appeared] [3:Gradually Appeared] Wounding Event: [1:Pressure Ulcer] [2:Pressure Ulcer] [3:Pressure Ulcer] Primary Etiology: [1:Hypotension, Type II Diabetes] [2:Hypotension, Type II Diabetes] [3:Hypotension, Type II  Diabetes] Comorbid History: [1:08/12/2019] [2:08/12/2019] [3:08/12/2019] Date Acquired: [1:4] [2:4] [3:4] Weeks of Treatment: [1:Open] [2:Open] [3:Open] Wound Status: [1:3.3x2.1x0.1] [2:0.5x1.5x0.1] [3:3.6x2.5x0.1] Measurements L x W x D (cm) [1:5.443] [2:0.589] [3:7.069] A (cm) : rea [1:0.544] [2:0.059] [3:0.707] Volume (cm) : [1:-27.40%] [2:62.50%] [3:-25.00%] % Reduction in A rea: [1:-27.40%] [2:81.20%] [3:-25.10%] % Reduction in Volume: [1:Unstageable/Unclassified] [2:Unstageable/Unclassified] [3:Unstageable/Unclassified] Classification: [1:None Present] [2:Medium] [3:None Present] Exudate A mount: [1:N/A] [2:Serosanguineous] [3:N/A] Exudate Type: [1:N/A] [2:red, brown] [3:N/A] Exudate Color: [1:Distinct, outline attached] [2:Distinct, outline attached] [3:Distinct, outline attached] Wound Margin: [1:None Present (0%)] [2:Small (1-33%)] [3:None Present (0%)] Granulation A mount: [1:N/A] [2:Pink] [3:N/A] Granulation Quality: [1:Large (67-100%)] [2:Large (67-100%)] [3:Large (67-100%)] Necrotic A mount: [1:Eschar] [2:Eschar, Adherent Slough] [3:Eschar] Necrotic Tissue: [1:Fascia: No] [2:Fascia: No] [3:Fascia: No] Exposed Structures: [1:Fat Layer (Subcutaneous Tissue): No Tendon: No Muscle: No Joint: No Bone: No None] [2:Fat Layer (Subcutaneous Tissue): No Tendon: No Muscle: No Joint: No Bone: No None] [3:Fat Layer (Subcutaneous Tissue): No Tendon: No Muscle: No Joint: No Bone: No  None] Epithelialization: [1:Debridement - Excisional] [2:N/A] [3:Debridement - Excisional] Debridement: Pre-procedure Verification/Time Out 13:40 [2:N/A] [3:13:40] Taken: [1:Other] [2:N/A] [3:Other] Pain Control: [1:Necrotic/Eschar, Subcutaneous] [2:N/A] [3:Necrotic/Eschar, Subcutaneous] Tissue Debrided: [1:Skin/Subcutaneous Tissue] [2:N/A] [3:Skin/Subcutaneous Tissue] Level: [1:6.93] [2:N/A] [3:9] Debridement A (sq cm): [1:rea Curette] [2:N/A] [3:Curette] Instrument: [1:Moderate] [2:N/A]  [3:Moderate] Bleeding: [1:Pressure] [2:N/A] [3:Pressure] Hemostasis A chieved: [1:0] [2:N/A] [3:0] Procedural Pain: [1:0] [2:N/A] [  3:0] Post Procedural Pain: [1:Procedure was tolerated well] [2:N/A] [3:Procedure was tolerated well] Debridement Treatment Response: [1:3.3x2.1x0.1] [2:N/A] [3:3.6x2.5x0.1] Post Debridement Measurements L x W x D (cm) [1:0.544] [2:N/A] [3:0.707] Post Debridement Volume: (cm) [1:Unstageable/Unclassified] [2:N/A] [3:Unstageable/Unclassified] Post Debridement Stage: [1:Compression Therapy] [2:Compression Therapy] [3:Compression Therapy] Procedures Performed: [1:Debridement] [2:4 N/A] [3:Debridement N/A] Photos: [1:No Photos Left, Lateral Lower Leg] [2:N/A N/A] [3:N/A N/A] Wound Location: [1:Pressure Injury] [2:N/A] [3:N/A] Wounding Event: [1:Pressure Ulcer] [2:N/A] [3:N/A] Primary Etiology: [1:Hypotension, Type II Diabetes] [2:N/A] [3:N/A] Comorbid History: [1:08/12/2019] [2:N/A] [3:N/A] Date Acquired: [1:4] [2:N/A] [3:N/A] Weeks of Treatment: [1:Open] [2:N/A] [3:N/A] Wound Status: [1:2x0.7x0.1] [2:N/A] [3:N/A] Measurements L x W x D (cm) [1:1.1] [2:N/A] [3:N/A] A (cm) : rea [1:0.11] [2:N/A] [3:N/A] Volume (cm) : [1:62.60%] [2:N/A] [3:N/A] % Reduction in A rea: [1:62.70%] [2:N/A] [3:N/A] % Reduction in Volume: [1:Unstageable/Unclassified] [2:N/A] [3:N/A] Classification: [1:Small] [2:N/A] [3:N/A] Exudate A mount: [1:Serous] [2:N/A] [3:N/A] Exudate Type: [1:amber] [2:N/A] [3:N/A] Exudate Color: [1:Distinct, outline attached] [2:N/A] [3:N/A] Wound Margin: [1:Small (1-33%)] [2:N/A] [3:N/A] Granulation A mount: [1:Red] [2:N/A] [3:N/A] Granulation Quality: [1:Large (67-100%)] [2:N/A] [3:N/A] Necrotic A mount: [1:Eschar, Adherent Slough] [2:N/A] [3:N/A] Necrotic Tissue: [1:Fat Layer (Subcutaneous Tissue): Yes N/A] [3:N/A] Exposed Structures: [1:Fascia: No Tendon: No Muscle: No Joint: No Bone: No None] [2:N/A] [3:N/A] Epithelialization: [1:Debridement -  Excisional] [2:N/A] [3:N/A] Debridement: Pre-procedure Verification/Time Out 13:40 [2:N/A] [3:N/A] Taken: [1:Other] [2:N/A] [3:N/A] Pain Control: [1:Necrotic/Eschar, Subcutaneous] [2:N/A] [3:N/A] Tissue Debrided: [1:Skin/Subcutaneous Tissue] [2:N/A] [3:N/A] Level: [1:1.4] [2:N/A] [3:N/A] Debridement A (sq cm): [1:rea Curette] [2:N/A] [3:N/A] Instrument: [1:Moderate] [2:N/A] [3:N/A] Bleeding: [1:Pressure] [2:N/A] [3:N/A] Hemostasis A chieved: [1:0] [2:N/A] [3:N/A] Procedural Pain: [1:0] [2:N/A] [3:N/A] Post Procedural Pain: [1:Procedure was tolerated well] [2:N/A] [3:N/A] Debridement Treatment Response: [1:2x0.7x0.1] [2:N/A] [3:N/A] Post Debridement Measurements L x W x D (cm) [1:0.11] [2:N/A] [3:N/A] Post Debridement Volume: (cm) [1:Unstageable/Unclassified] [2:N/A] [3:N/A] Post Debridement Stage: [1:Compression Therapy] [2:N/A] [3:N/A] Procedures Performed: [1:Debridement] Treatment Notes Electronic Signature([name]: 11/01/2019 8:08:43 AM By: Baltazar Najjar MD Signed: 11/01/2019 1:33:30 PM By: Cherylin Mylar Entered By: Baltazar Najjar on 10/29/2019 14:53:16 -------------------------------------------------------------------------------- Multi-Disciplinary Care Plan Details Patient Name: Date of Service: [name], [place] RLO [name] 10/29/2019 1:15 PM Medical Record Number: [id number] Patient Account Number: [id number] Date of Birth/Sex: Treating RN: [birth year] (54 y.o. [name] Primary Care Franchesca Veneziano: [name] Other Clinician: Referring Dezaray Shibuya: Treating Indigo Chaddock/Extender: [name] in Treatment: 4 Active Inactive Nutrition Nursing Diagnoses: Impaired glucose control: actual or potential Goals: Patient/caregiver verbalizes understanding of need to maintain therapeutic glucose control per primary care physician Date Initiated: 10/01/2019 Target Resolution Date: 11/05/2019 Goal Status: Active Interventions: Provide education  on elevated blood sugars and impact on wound healing Notes: Pressure Nursing Diagnoses: Knowledge deficit related to causes and risk factors for pressure ulcer development Goals: Patient/caregiver will verbalize risk factors for pressure ulcer development Date Initiated: 10/01/2019 Target Resolution Date: 11/05/2019 Goal Status: Active Interventions: Provide education on pressure ulcers Notes: Wound/Skin Impairment Nursing Diagnoses: Impaired tissue integrity Goals: Ulcer/skin breakdown will have a volume reduction of 50% by week 8 Date Initiated: 10/01/2019 Target Resolution Date: 11/05/2019 Goal Status: Active Interventions: Provide education on ulcer and skin care Notes: Electronic Signature([name]: 11/01/2019 1:33:30 PM By: Cherylin Mylar Entered By: Cherylin Mylar on 10/29/2019 13:49:12 -------------------------------------------------------------------------------- Pain Assessment Details Patient Name: Date of Service: [name] 10/29/2019 1:15 PM Medical Record Number: [id number] Patient Account Number: [id number] Date of Birth/Sex: Treating RN: [birth year] (53 y.o. [name] Primary Care Lamel Mccarley: [name] Other Clinician: Referring Ural Acree: Treating Cenia Zaragosa/Extender: Oley Balm,  Casey Burkitt in Treatment: 4 Active Problems Location of Pain Severity and Description of Pain Patient Has Paino No Site Locations Pain Management and Medication Current Pain Management: Electronic Signature([name]: 11/01/2019 1:15:48 PM By: Yevonne Pax RN Entered By: Yevonne Pax on 10/29/2019 13:22:47 -------------------------------------------------------------------------------- Patient/Caregiver Education Details Patient Name: Date of Service: [name] RLO [name] 9/17/2021andnbsp1:15 PM Medical Record Number: [id number] Patient Account Number: [id number] Date of Birth/Gender: Treating RN: 1965-06-28 (55 y.o. [name] Primary Care Physician: [name] Other Clinician: Referring Physician: Treating Physician/Extender: [name] in Treatment: 4 Education Assessment Education Provided To: Patient Education Topics Provided Pressure: Handouts: Pressure Ulcers: Care and Offloading Methods: Explain/Verbal Responses: State content correctly Electronic Signature([name]: 11/01/2019 1:33:30 PM By: Cherylin Mylar Entered By: Cherylin Mylar on 10/29/2019 13:49:26 -------------------------------------------------------------------------------- Wound Assessment Details Patient Name: Date of Service: [name], [place] RLO [name] 10/29/2019 1:15 PM Medical Record Number: [id number] Patient Account Number: [id number] Date of Birth/Sex: Treating RN: 07-13-1965 (54 y.o. [name]) Yevonne Pax Primary Care Lunna Vogelgesang: [name] Other Clinician: Referring Lanetra Hartley: Treating Marzell Isakson/Extender: [name] in Treatment: 4 Wound Status Wound Number: 1 Primary Etiology: Pressure Ulcer Wound Location: [name] Metatarsal head fifth Wound Status: Open Wounding Event: Gradually Appeared Comorbid History: Hypotension, Type II Diabetes Date Acquired: 08/12/2019 Weeks Of Treatment: 4 Clustered Wound: No Wound Measurements Length: (cm) 3.3 Width: (cm) 2.1 Depth: (cm) 0.1 Area: (cm) 5.443 Volume: (cm) 0.544 % Reduction in Area: -27.4% % Reduction in Volume: -27.4% Epithelialization: None Tunneling: No Undermining: No Wound Description Classification: Unstageable/Unclassified Wound Margin: Distinct, outline attached Exudate Amount: None Present Foul Odor After Cleansing: No Slough/Fibrino No Wound Bed Granulation Amount: None Present (0%) Exposed Structure Necrotic Amount: Large (67-100%) Fascia Exposed: No Necrotic Quality: Eschar Fat Layer (Subcutaneous Tissue) Exposed: No Tendon Exposed: No Muscle Exposed: No Joint Exposed:  No Bone Exposed: No Treatment Notes Wound #1 ([name] Metatarsal head fifth) 1. Cleanse With Wound Cleanser Soap and water 2. Periwound Care Moisturizing lotion 3. Primary Dressing Applied Hydrogel or K-Y Jelly Other primary dressing (specifiy in notes) 4. Secondary Dressing ABD Pad 6. Support Layer Applied 3 layer compression wrap Notes primary dressing hydrogel with cutimed sorbact swab. netting. Electronic Signature([name]: 11/01/2019 1:15:48 PM By: Yevonne Pax RN Entered By: Yevonne Pax on 10/29/2019 13:25:13 -------------------------------------------------------------------------------- Wound Assessment Details Patient Name: Date of Service: [name] 10/29/2019 1:15 PM Medical Record Number: [id number] Patient Account Number: [id number] Date of Birth/Sex: Treating RN: 1965/03/08 (54 y.o. [name]) Yevonne Pax Primary Care Athaliah Baumbach: [name] Other Clinician: Referring Brandn Mcgath: Treating Cher Franzoni/Extender: [name] in Treatment: 4 Wound Status Wound Number: 2 Primary Etiology: Pressure Ulcer Wound Location: [name] Calcaneus Wound Status: Open Wounding Event: Gradually Appeared Comorbid History: Hypotension, Type II Diabetes Date Acquired: 08/12/2019 Weeks Of Treatment: 4 Clustered Wound: No Wound Measurements Length: (cm) 0.5 Width: (cm) 1.5 Depth: (cm) 0.1 Area: (cm) 0.589 Volume: (cm) 0.059 % Reduction in Area: 62.5% % Reduction in Volume: 81.2% Epithelialization: None Tunneling: No Undermining: No Wound Description Classification: Unstageable/Unclassified Wound Margin: Distinct, outline attached Exudate Amount: Medium Exudate Type: Serosanguineous Exudate Color: red, brown Foul Odor After Cleansing: No Slough/Fibrino Yes Wound Bed Granulation Amount: Small (1-33%) Exposed Structure Granulation Quality: Pink Fascia Exposed: No Necrotic Amount: Large (67-100%) Fat Layer (Subcutaneous Tissue) Exposed:  No Necrotic Quality: Eschar, Adherent Slough Tendon Exposed: No Muscle Exposed: No Joint Exposed: No Bone Exposed: No Treatment Notes Wound #2 ([name] Calcaneus) 1. Cleanse With Wound Cleanser Soap  and water 2. Periwound Care Moisturizing lotion 3. Primary Dressing Applied Hydrogel or K-Y Jelly Other primary dressing (specifiy in notes) 4. Secondary Dressing Dry Gauze Heel Cup 6. Support Layer Applied 3 layer compression wrap Notes netting. Electronic Signature([name]: 11/01/2019 1:15:48 PM By: Yevonne PaxEpps, Carrie RN Entered By: Yevonne PaxEpps, Carrie on 10/29/2019 13:25:22 -------------------------------------------------------------------------------- Wound Assessment Details Patient Name: Date of Service: Harry MoroFA CUNDO, Sherlyn LeesCA RLO [name] 10/29/2019 1:15 PM Medical Record Number: [id number] Patient Account Number: [id number] Date of Birth/Sex: Treating RN: 1965/04/25 (54 y.o. Harry PetitM) Yevonne PaxEpps, Carrie Primary Care Pia Jedlicka: Gwinda PasseEdwards, Michelle Other Clinician: Referring Twanda Stakes: Treating Trayquan Kolakowski/Extender: Aurelio Brashobson, Michael Edwards, Michelle Weeks in Treatment: 4 Wound Status Wound Number: 3 Primary Etiology: Pressure Ulcer Wound Location: Left Metatarsal head fifth Wound Status: Open Wounding Event: Gradually Appeared Comorbid History: Hypotension, Type II Diabetes Date Acquired: 08/12/2019 Weeks Of Treatment: 4 Clustered Wound: No Wound Measurements Length: (cm) 3.6 Width: (cm) 2.5 Depth: (cm) 0.1 Area: (cm) 7.069 Volume: (cm) 0.707 % Reduction in Area: -25% % Reduction in Volume: -25.1% Epithelialization: None Tunneling: No Undermining: No Wound Description Classification: Unstageable/Unclassified Wound Margin: Distinct, outline attached Exudate Amount: None Present Foul Odor After Cleansing: No Slough/Fibrino No Wound Bed Granulation Amount: None Present (0%) Exposed Structure Necrotic Amount: Large (67-100%) Fascia Exposed: No Necrotic Quality: Eschar Fat Layer (Subcutaneous Tissue)  Exposed: No Tendon Exposed: No Muscle Exposed: No Joint Exposed: No Bone Exposed: No Treatment Notes Wound #3 (Left Metatarsal head fifth) 1. Cleanse With Wound Cleanser Soap and water 2. Periwound Care Moisturizing lotion 3. Primary Dressing Applied Hydrogel or K-Y Jelly Other primary dressing (specifiy in notes) 4. Secondary Dressing ABD Pad 6. Support Layer Applied 3 layer compression wrap Notes primary dressing hydrogel with cutimed sorbact swab. netting. Electronic Signature([name]: 11/01/2019 1:15:48 PM By: Yevonne PaxEpps, Carrie RN Entered By: Yevonne PaxEpps, Carrie on 10/29/2019 13:25:32 -------------------------------------------------------------------------------- Wound Assessment Details Patient Name: Date of Service: Harry MoroFA CUNDO, Sherlyn LeesCA RLO [name] 10/29/2019 1:15 PM Medical Record Number: [id number] Patient Account Number: [id number] Date of Birth/Sex: Treating RN: 1965/04/25 (54 y.o. Harry PetitM) Yevonne PaxEpps, Carrie Primary Care Yony Roulston: Gwinda PasseEdwards, Michelle Other Clinician: Referring Glenisha Gundry: Treating Cyrah Mclamb/Extender: Aurelio Brashobson, Michael Edwards, Michelle Weeks in Treatment: 4 Wound Status Wound Number: 4 Primary Etiology: Pressure Ulcer Wound Location: Left, Lateral Lower Leg Wound Status: Open Wounding Event: Pressure Injury Comorbid History: Hypotension, Type II Diabetes Date Acquired: 08/12/2019 Weeks Of Treatment: 4 Clustered Wound: No Wound Measurements Length: (cm) 2 Width: (cm) 0.7 Depth: (cm) 0.1 Area: (cm) 1.1 Volume: (cm) 0.11 % Reduction in Area: 62.6% % Reduction in Volume: 62.7% Epithelialization: None Tunneling: No Undermining: No Wound Description Classification: Unstageable/Unclassified Wound Margin: Distinct, outline attached Exudate Amount: Small Exudate Type: Serous Exudate Color: amber Foul Odor After Cleansing: No Slough/Fibrino Yes Wound Bed Granulation Amount: Small (1-33%) Exposed Structure Granulation Quality: Red Fascia Exposed: No Necrotic Amount: Large  (67-100%) Fat Layer (Subcutaneous Tissue) Exposed: Yes Necrotic Quality: Eschar, Adherent Slough Tendon Exposed: No Muscle Exposed: No Joint Exposed: No Bone Exposed: No Treatment Notes Wound #4 (Left, Lateral Lower Leg) 1. Cleanse With Wound Cleanser Soap and water 2. Periwound Care Moisturizing lotion 3. Primary Dressing Applied Hydrogel or K-Y Jelly Other primary dressing (specifiy in notes) 4. Secondary Dressing ABD Pad 6. Support Layer Applied 3 layer compression wrap Notes primary dressing hydrogel with cutimed sorbact swab. netting. Electronic Signature([name]: 11/01/2019 1:15:48 PM By: Yevonne PaxEpps, Carrie RN Entered By: Yevonne PaxEpps, Carrie on 10/29/2019 13:25:40 -------------------------------------------------------------------------------- Vitals Details Patient Name: Date of Service: Harry MoroFA CUNDO, [place] RLO [name] 10/29/2019 1:15 PM Medical Record Number: [id number]  Patient Account Number: [id number] Date of Birth/Sex: Treating RN: 03-07-1965 (54 y.o. [name]) Yevonne Pax Primary Care Ellenore Roscoe: [name] Other Clinician: Referring Shad Ledvina: Treating Cassara Nida/Extender: [name] in Treatment: 4 Vital Signs Time Taken: 13:22 Temperature (F): 97.6 Height (in): 68 Pulse (bpm): 73 Weight (lbs): 235 Respiratory Rate (breaths/min): 16 Body Mass Index (BMI): 35.7 Blood Pressure (mmHg): 103/67 Reference Range: 80 - 120 mg / dl Electronic Signature([name]: 11/01/2019 1:15:48 PM By: Yevonne Pax RN Entered By: Yevonne Pax on 10/29/2019 13:22:41

## 2019-11-05 ENCOUNTER — Other Ambulatory Visit: Payer: Self-pay

## 2019-11-05 ENCOUNTER — Encounter (HOSPITAL_BASED_OUTPATIENT_CLINIC_OR_DEPARTMENT_OTHER): Payer: Medicaid Other | Admitting: Internal Medicine

## 2019-11-05 DIAGNOSIS — E11621 Type 2 diabetes mellitus with foot ulcer: Secondary | ICD-10-CM | POA: Diagnosis not present

## 2019-11-07 NOTE — Progress Notes (Signed)
[name], [name] ([id number]) Visit Report for 11/05/2019 Arrival Information Details Patient Name: Date of Service: Harry ChurchFA CUNDO, [name] 11/05/2019 1:45 PM Medical Record Number: [id number] Patient Account Number: [id number] Date of Birth/Sex: Treating RN: [birth year] (54 y.o. [name]) Boehlein, Linda Primary Care Phillippe Orlick: Gwinda PasseEdwards, Michelle Other Clinician: Referring Alicja Everitt: Treating Desarae Placide/Extender: Aurelio Brashobson, Michael Edwards, Michelle Weeks in Treatment: 5 Visit Information History Since Last Visit Added or deleted any medications: No Patient Arrived: Wheel Chair Any new allergies or adverse reactions: No Arrival Time: 14:11 Had a fall or experienced change in No Accompanied By: spouse, interpreter activities of daily living that may affect Transfer Assistance: None risk of falls: Patient Identification Verified: Yes Signs or symptoms of abuse/neglect since last visito No Secondary Verification Process Completed: Yes Hospitalized since last visit: No Patient Requires Transmission-Based Precautions: No Implantable device outside of the clinic excluding No Patient Has Alerts: Yes cellular tissue based products placed in the center Patient Alerts: Translator Required since last visit: right ABI 1.61 Has Dressing in Place as Prescribed: Yes Left ABI 1.36 Has Compression in Place as Prescribed: Yes Has Footwear/Offloading in Place as Prescribed: Yes Left: Multipodus Split/Boot Right: Multipodus Split/Boot Pain Present Now: No Notes stays in wheelchair Electronic Signature(s) Signed: 11/05/2019 4:39:15 PM By: Zenaida DeedBoehlein, Linda RN, BSN Entered By: Zenaida DeedBoehlein, Linda on 11/05/2019 14:12:52 -------------------------------------------------------------------------------- Compression Therapy Details Patient Name: Date of Service: Harry MoroFA CUNDO, [name] 11/05/2019 1:45 PM Medical Record Number: [id number] Patient Account Number: [id number] Date of Birth/Sex: Treating RN: [birth year] (54 y.o. Katherina RightM)  Dwiggins, Shannon Primary Care Khamari Yousuf: Gwinda PasseEdwards, Michelle Other Clinician: Referring Milika Ventress: Treating Hong Timm/Extender: Aurelio Brashobson, Michael Edwards, Michelle Weeks in Treatment: 5 Compression Therapy Performed for Wound Assessment: Wound #1 Right Metatarsal head fifth Performed By: Clinician Shawn Stalleaton, Bobbi, RN Compression Type: Three Layer Post Procedure Diagnosis Same as Pre-procedure Electronic Signature(s) Signed: 11/05/2019 5:26:06 PM By: Cherylin Mylarwiggins, Shannon Entered By: Cherylin Mylarwiggins, Shannon on 11/05/2019 14:57:27 -------------------------------------------------------------------------------- Compression Therapy Details Patient Name: Date of Service: Harry MoroFA CUNDO, Harry DewA RLO S 11/05/2019 1:45 PM Medical Record Number: [id number] Patient Account Number: [id number] Date of Birth/Sex: Treating RN: [birth year] (54 y.o. Katherina RightM) Dwiggins, Shannon Primary Care Shrika Milos: Gwinda PasseEdwards, Michelle Other Clinician: Referring Jeneen Doutt: Treating Maddie Brazier/Extender: Aurelio Brashobson, Michael Edwards, Michelle Weeks in Treatment: 5 Compression Therapy Performed for Wound Assessment: Wound #2 Right Calcaneus Performed By: Clinician Shawn Stalleaton, Bobbi, RN Compression Type: Three Layer Post Procedure Diagnosis Same as Pre-procedure Electronic Signature(s) Signed: 11/05/2019 5:26:06 PM By: Cherylin Mylarwiggins, Shannon Entered By: Cherylin Mylarwiggins, Shannon on 11/05/2019 14:57:27 -------------------------------------------------------------------------------- Compression Therapy Details Patient Name: Date of Service: Harry MoroFA CUNDO, Harry DewA RLO S 11/05/2019 1:45 PM Medical Record Number: [id number] Patient Account Number: [id number] Date of Birth/Sex: Treating RN: [birth year] (54 y.o. Katherina RightM) Dwiggins, Shannon Primary Care Bernadene Garside: Gwinda PasseEdwards, Michelle Other Clinician: Referring Noam Karaffa: Treating Onica Davidovich/Extender: Aurelio Brashobson, Michael Edwards, Michelle Weeks in Treatment: 5 Compression Therapy Performed for Wound Assessment: Wound #3 Left Metatarsal head  fifth Performed By: Clinician Shawn Stalleaton, Bobbi, RN Compression Type: Three Layer Post Procedure Diagnosis Same as Pre-procedure Electronic Signature(s) Signed: 11/05/2019 5:26:06 PM By: Cherylin Mylarwiggins, Shannon Entered By: Cherylin Mylarwiggins, Shannon on 11/05/2019 14:57:27 -------------------------------------------------------------------------------- Compression Therapy Details Patient Name: Date of Service: Harry MoroFA CUNDO, Harry DewA RLO S 11/05/2019 1:45 PM Medical Record Number: [id number] Patient Account Number: [id number] Date of Birth/Sex: Treating RN: [birth year] (54 y.o. Katherina RightM) Dwiggins, Shannon Primary Care Ariyan Brisendine: Gwinda PasseEdwards, Michelle Other Clinician: Referring Artis Buechele: Treating Roya Gieselman/Extender: Aurelio Brashobson, Michael Edwards, Michelle Weeks in Treatment: 5 Compression Therapy Performed for Wound Assessment: Wound #4 Left,Lateral Lower Leg Performed By: Clinician Shawn Stalleaton, Bobbi, RN Compression Type: Three Layer Post  Procedure Diagnosis Same as Pre-procedure Electronic Signature(s) Signed: 11/05/2019 5:26:06 PM By: Cherylin Mylar Entered By: Cherylin Mylar on 11/05/2019 14:57:27 -------------------------------------------------------------------------------- Encounter Discharge Information Details Patient Name: Date of Service: [name], [name] 11/05/2019 1:45 PM Medical Record Number: [id number] Patient Account Number: [id number] Date of Birth/Sex: Treating RN: [birth year] (54 y.o. [name] Primary Care Samuell Knoble: [name] Other Clinician: Referring Brieonna Crutcher: Treating Charnetta Wulff/Extender: [name] in Treatment: 5 Encounter Discharge Information Items Post Procedure Vitals Schedule Follow-up Appointment: No Temperature (F): 98 Clinical Summary of Care: Pulse (bpm): 58 Respiratory Rate (breaths/min): 18 Blood Pressure (mmHg): 116/67 Electronic Signature(s) Signed: 11/05/2019 5:24:54 PM By: Shawn Stall Entered By: Shawn Stall on 11/05/2019  15:31:37 -------------------------------------------------------------------------------- Lower Extremity Assessment Details Patient Name: Date of Service: [name] 11/05/2019 1:45 PM Medical Record Number: [id number] Patient Account Number: [id number] Date of Birth/Sex: Treating RN: November 21, 1965 (54 y.o. [name] Primary Care Jayko Voorhees: [name] Other Clinician: Referring Luwanna Brossman: Treating Danielle Lento/Extender: [name] in Treatment: 5 Edema Assessment Assessed: [Left: No] [Right: No] E[Left: dema] [Right: :] Calf Left: Right: Point of Measurement: cm From Medial Instep 49.6 cm 45.2 cm Ankle Left: Right: Point of Measurement: cm From Medial Instep 29.4 cm 28.4 cm Vascular Assessment Pulses: Dorsalis Pedis Palpable: [Left:No] [Right:Yes] Electronic Signature(s) Signed: 11/05/2019 4:39:15 PM By: Zenaida Deed RN, BSN Entered By: Zenaida Deed on 11/05/2019 14:23:46 -------------------------------------------------------------------------------- Multi Wound Chart Details Patient Name: Date of Service: [name], [name] 11/05/2019 1:45 PM Medical Record Number: [id number] Patient Account Number: [id number] Date of Birth/Sex: Treating RN: 02/17/1965 (55 y.o. M) Primary Care Brelan Hannen: [name] Other Clinician: Referring Jaiyden Laur: Treating Harley Fitzwater/Extender: [name] in Treatment: 5 Vital Signs Height(in): 68 Pulse(bpm): 58 Weight(lbs): 235 Blood Pressure(mmHg): 116/67 Body Mass Index(BMI): 36 Temperature(F): 98 Respiratory Rate(breaths/min): 18 Photos: [1:No Photos Right Metatarsal head fifth] [2:No Photos Right Calcaneus] [3:No Photos Left Metatarsal head fifth] Wound Location: [1:Gradually Appeared] [2:Gradually Appeared] [3:Gradually Appeared] Wounding Event: [1:Pressure Ulcer] [2:Pressure Ulcer] [3:Pressure Ulcer] Primary Etiology: [1:Hypotension, Type II Diabetes]  [2:Hypotension, Type II Diabetes] [3:Hypotension, Type II Diabetes] Comorbid History: [1:08/12/2019] [2:08/12/2019] [3:08/12/2019] Date Acquired: [1:5] [2:5] [3:5] Weeks of Treatment: [1:Open] [2:Open] [3:Open] Wound Status: [1:3.4x2x0.7] [2:0.2x0.7x0.1] [3:3.4x2.4x0.7] Measurements L x W x D (cm) [1:5.341] [2:0.11] [3:6.409] A (cm) : rea [1:3.738] [2:0.011] [3:4.486] Volume (cm) : [1:-25.00%] [2:93.00%] [3:-13.30%] % Reduction in A rea: [1:-775.40%] [2:96.50%] [3:-694.00%] % Reduction in Volume: [1:Unstageable/Unclassified] [2:Category/Stage III] [3:Unstageable/Unclassified] Classification: [1:Medium] [2:Small] [3:Medium] Exudate A mount: [1:Serosanguineous] [2:Serosanguineous] [3:Serosanguineous] Exudate Type: [1:red, brown] [2:red, brown] [3:red, brown] Exudate Color: [1:Distinct, outline attached] [2:Flat and Intact] [3:Distinct, outline attached] Wound Margin: [1:None Present (0%)] [2:Large (67-100%)] [3:None Present (0%)] Granulation A mount: [1:N/A] [2:Red] [3:N/A] Granulation Quality: [1:Large (67-100%)] [2:None Present (0%)] [3:Large (67-100%)] Necrotic A mount: [1:Adherent Slough] [2:N/A] [3:Eschar, Adherent Slough] Necrotic Tissue: [1:Fat Layer (Subcutaneous Tissue): Yes Fat Layer (Subcutaneous Tissue): Yes Fat Layer (Subcutaneous Tissue): Yes] Exposed Structures: [1:Fascia: No Tendon: No Muscle: No Joint: No Bone: No None] [2:Fascia: No Tendon: No Muscle: No Joint: No Bone: No Medium (34-66%)] [3:Fascia: No Tendon: No Muscle: No Joint: No Bone: No None] Epithelialization: [1:Debridement - Excisional] [2:N/A] [3:Debridement - Excisional] Debridement: Pre-procedure Verification/Time Out 14:55 [2:N/A] [3:14:55] Taken: [1:Other] [2:N/A] [3:Other] Pain Control: [1:Necrotic/Eschar, Subcutaneous,] [2:N/A] [3:Necrotic/Eschar, Subcutaneous,] Tissue Debrided: [1:Slough Skin/Subcutaneous Tissue] [2:N/A] [3:Slough Skin/Subcutaneous Tissue] Level: [1:6.8] [2:N/A] [3:8.16] Debridement A (sq  cm): [1:rea Curette] [2:N/A] [3:Curette] Instrument: [1:Moderate] [2:N/A] [3:Moderate] Bleeding: [1:Pressure] [2:N/A] [3:Pressure] Hemostasis Achieved: [  1:0] [2:N/A] [3:0] Procedural Pain: [1:0] [2:N/A] [3:0] Post Procedural Pain: Debridement Treatment Response: Procedure was tolerated well [2:N/A] [3:Procedure was tolerated well] Post Debridement Measurements L x 3.4x2x0.7 [2:N/A] [3:3.4x2.4x0.7] W x D (cm) [1:3.738] [2:N/A] [3:4.486] Post Debridement Volume: (cm) [1:Unstageable/Unclassified] [2:N/A] [3:Unstageable/Unclassified] Post Debridement Stage: [1:Compression Therapy] [2:Compression Therapy] [3:Compression Therapy] Procedures Performed: [1:Debridement] [2:4 N/A] [3:Debridement N/A] Photos: [1:No Photos Left, Lateral Lower Leg] [2:N/A N/A] [3:N/A N/A] Wound Location: [1:Pressure Injury] [2:N/A] [3:N/A] Wounding Event: [1:Pressure Ulcer] [2:N/A] [3:N/A] Primary Etiology: [1:Hypotension, Type II Diabetes] [2:N/A] [3:N/A] Comorbid History: [1:08/12/2019] [2:N/A] [3:N/A] Date Acquired: [1:5] [2:N/A] [3:N/A] Weeks of Treatment: [1:Open] [2:N/A] [3:N/A] Wound Status: [1:2x1x0.2] [2:N/A] [3:N/A] Measurements L x W x D (cm) [1:1.571] [2:N/A] [3:N/A] A (cm) : rea [1:0.314] [2:N/A] [3:N/A] Volume (cm) : [1:46.70%] [2:N/A] [3:N/A] % Reduction in A rea: [1:-6.40%] [2:N/A] [3:N/A] % Reduction in Volume: [1:Category/Stage III] [2:N/A] [3:N/A] Classification: [1:Medium] [2:N/A] [3:N/A] Exudate A mount: [1:Serosanguineous] [2:N/A] [3:N/A] Exudate Type: [1:red, brown] [2:N/A] [3:N/A] Exudate Color: [1:Distinct, outline attached] [2:N/A] [3:N/A] Wound Margin: [1:Large (67-100%)] [2:N/A] [3:N/A] Granulation A mount: [1:Red] [2:N/A] [3:N/A] Granulation Quality: [1:Small (1-33%)] [2:N/A] [3:N/A] Necrotic A mount: [1:Adherent Slough] [2:N/A] [3:N/A] Necrotic Tissue: [1:Fat Layer (Subcutaneous Tissue): Yes N/A] [3:N/A] Exposed Structures: [1:Fascia: No Tendon: No Muscle: No Joint: No Bone: No  None] [2:N/A] [3:N/A] Epithelialization: [1:N/A] [2:N/A] [3:N/A] Debridement: [1:N/A] [2:N/A] [3:N/A] Pain Control: [1:N/A] [2:N/A] [3:N/A] Tissue Debrided: [1:N/A] [2:N/A] [3:N/A] Level: [1:N/A] [2:N/A] [3:N/A] Debridement A (sq cm): [1:rea N/A] [2:N/A] [3:N/A] Instrument: [1:N/A] [2:N/A] [3:N/A] Bleeding: [1:N/A] [2:N/A] [3:N/A] Hemostasis A chieved: [1:N/A] [2:N/A] [3:N/A] Procedural Pain: [1:N/A] [2:N/A] [3:N/A] Post Procedural Pain: Debridement Treatment Response: N/A [2:N/A] [3:N/A] Post Debridement Measurements L x N/A [2:N/A] [3:N/A] W x D (cm) [1:N/A] [2:N/A] [3:N/A] Post Debridement Volume: (cm) [1:N/A] [2:N/A] [3:N/A] Post Debridement Stage: [1:Compression Therapy] [2:N/A] [3:N/A] Treatment Notes Electronic Signature(s) Signed: 11/07/2019 6:30:39 AM By: Baltazar Najjar MD Entered By: Baltazar Najjar on 11/05/2019 14:58:49 -------------------------------------------------------------------------------- Multi-Disciplinary Care Plan Details Patient Name: Date of Service: [name], [name] 11/05/2019 1:45 PM Medical Record Number: [id number] Patient Account Number: [id number] Date of Birth/Sex: Treating RN: 06-04-1965 (54 y.o. [name] Primary Care Tayquan Gassman: [name] Other Clinician: Referring Thermon Zulauf: Treating Peace Jost/Extender: [name] in Treatment: 5 Active Inactive Nutrition Nursing Diagnoses: Impaired glucose control: actual or potential Goals: Patient/caregiver verbalizes understanding of need to maintain therapeutic glucose control per primary care physician Date Initiated: 10/01/2019 Target Resolution Date: 12/03/2019 Goal Status: Active Interventions: Provide education on elevated blood sugars and impact on wound healing Notes: Pressure Nursing Diagnoses: Knowledge deficit related to causes and risk factors for pressure ulcer development Goals: Patient/caregiver will verbalize risk factors for  pressure ulcer development Date Initiated: 10/01/2019 Target Resolution Date: 12/03/2019 Goal Status: Active Interventions: Provide education on pressure ulcers Notes: Wound/Skin Impairment Nursing Diagnoses: Impaired tissue integrity Goals: Ulcer/skin breakdown will have a volume reduction of 50% by week 8 Date Initiated: 10/01/2019 Target Resolution Date: 12/03/2019 Goal Status: Active Interventions: Provide education on ulcer and skin care Notes: Electronic Signature(s) Signed: 11/05/2019 5:26:06 PM By: Cherylin Mylar Entered By: Cherylin Mylar on 11/05/2019 15:12:36 -------------------------------------------------------------------------------- Pain Assessment Details Patient Name: Date of Service: [name] 11/05/2019 1:45 PM Medical Record Number: [id number] Patient Account Number: [id number] Date of Birth/Sex: Treating RN: 1966-01-22 (54 y.o. [name] Primary Care Lena Fieldhouse: [name] Other Clinician: Referring Rashard Ryle: Treating Javarious Elsayed/Extender: [name] in Treatment: 5 Active Problems Location of Pain Severity and Description of Pain Patient  Has Paino No Site Locations Rate the pain. Rate the pain. Current Pain Level: 0 Pain Management and Medication Current Pain Management: Electronic Signature(s) Signed: 11/05/2019 4:39:15 PM By: Zenaida Deed RN, BSN Entered By: Zenaida Deed on 11/05/2019 14:22:02 -------------------------------------------------------------------------------- Patient/Caregiver Education Details Patient Name: Date of Service: [name] 9/24/2021andnbsp1:45 PM Medical Record Number: [id number] Patient Account Number: [id number] Date of Birth/Gender: Treating RN: 19-May-1965 (54 y.o. [name] Primary Care Physician: [name] Other Clinician: Referring Physician: Treating Physician/Extender: [name] in  Treatment: 5 Education Assessment Education Provided To: Patient Education Topics Provided Elevated Blood Sugar/ Impact on Healing: Handouts: Elevated Blood Sugars: How Do They Affect Wound Healing Methods: Explain/Verbal Responses: State content correctly Wound/Skin Impairment: Handouts: Caring for Your Ulcer Methods: Explain/Verbal Responses: State content correctly Electronic Signature(s) Signed: 11/05/2019 5:26:06 PM By: Cherylin Mylar Entered By: Cherylin Mylar on 11/05/2019 15:12:58 -------------------------------------------------------------------------------- Wound Assessment Details Patient Name: Date of Service: [name] RLO S 11/05/2019 1:45 PM Medical Record Number: [id number] Patient Account Number: [id number] Date of Birth/Sex: Treating RN: [birth year] (54 y.o. [name] Primary Care Shatera Rennert: [name] Other Clinician: Referring Mariel Lukins: Treating Giuseppina Quinones/Extender: [name] in Treatment: 5 Wound Status Wound Number: 1 Primary Etiology: Pressure Ulcer Wound Location: Right Metatarsal head fifth Wound Status: Open Wounding Event: Gradually Appeared Comorbid History: Hypotension, Type II Diabetes Date Acquired: 08/12/2019 Weeks Of Treatment: 5 Clustered Wound: No Wound Measurements Length: (cm) 3.4 Width: (cm) 2 Depth: (cm) 0.7 Area: (cm) 5.341 Volume: (cm) 3.738 % Reduction in Area: -25% % Reduction in Volume: -775.4% Epithelialization: None Tunneling: No Undermining: No Wound Description Classification: Unstageable/Unclassified Wound Margin: Distinct, outline attached Exudate Amount: Medium Exudate Type: Serosanguineous Exudate Color: red, brown Foul Odor After Cleansing: No Slough/Fibrino Yes Wound Bed Granulation Amount: None Present (0%) Exposed Structure Necrotic Amount: Large (67-100%) Fascia Exposed: No Necrotic Quality: Adherent Slough Fat Layer (Subcutaneous Tissue) Exposed:  Yes Tendon Exposed: No Muscle Exposed: No Joint Exposed: No Bone Exposed: No Treatment Notes Wound #1 (Right Metatarsal head fifth) 1. Cleanse With Soap and water 2. Periwound Care Moisturizing lotion 3. Primary Dressing Applied Iodoflex 4. Secondary Dressing ABD Pad Dry Gauze 6. Support Layer Applied 3 layer compression wrap Notes heel cup to right heel / Government social research officer) Signed: 11/05/2019 4:39:15 PM By: Zenaida Deed RN, BSN Entered By: Zenaida Deed on 11/05/2019 14:29:54 -------------------------------------------------------------------------------- Wound Assessment Details Patient Name: Date of Service: [name], [name] 11/05/2019 1:45 PM Medical Record Number: [id number] Patient Account Number: [id number] Date of Birth/Sex: Treating RN: 03-06-1965 (54 y.o. [name] Primary Care Reshma Hoey: [name] Other Clinician: Referring Ianmichael Amescua: Treating Emree Locicero/Extender: [name] in Treatment: 5 Wound Status Wound Number: 2 Primary Etiology: Pressure Ulcer Wound Location: Right Calcaneus Wound Status: Open Wounding Event: Gradually Appeared Comorbid History: Hypotension, Type II Diabetes Date Acquired: 08/12/2019 Weeks Of Treatment: 5 Clustered Wound: No Wound Measurements Length: (cm) 0.2 Width: (cm) 0.7 Depth: (cm) 0.1 Area: (cm) 0.11 Volume: (cm) 0.011 % Reduction in Area: 93% % Reduction in Volume: 96.5% Epithelialization: Medium (34-66%) Tunneling: No Undermining: No Wound Description Classification: Category/Stage III Wound Margin: Flat and Intact Exudate Amount: Small Exudate Type: Serosanguineous Exudate Color: red, brown Foul Odor After Cleansing: No Slough/Fibrino Yes Wound Bed Granulation Amount: Large (67-100%) Exposed Structure Granulation Quality: Red Fascia Exposed: No Necrotic Amount: None Present (0%) Fat Layer (Subcutaneous Tissue) Exposed: Yes Tendon Exposed:  No Muscle Exposed: No Joint Exposed: No Bone Exposed:  No Treatment Notes Wound #2 (Right Calcaneus) 1. Cleanse With Soap and water 2. Periwound Care Moisturizing lotion 3. Primary Dressing Applied Iodoflex 4. Secondary Dressing ABD Pad Dry Gauze 6. Support Layer Applied 3 layer compression wrap Notes heel cup to right heel / Government social research officer) Signed: 11/05/2019 4:39:15 PM By: Zenaida Deed RN, BSN Entered By: Zenaida Deed on 11/05/2019 14:30:21 -------------------------------------------------------------------------------- Wound Assessment Details Patient Name: Date of Service: [name], [name] 11/05/2019 1:45 PM Medical Record Number: [id number] Patient Account Number: [id number] Date of Birth/Sex: Treating RN: Jun 01, 1965 (54 y.o. [name] Primary Care Zaydn Gutridge: [name] Other Clinician: Referring Addysen Louth: Treating Esteban Kobashigawa/Extender: [name] in Treatment: 5 Wound Status Wound Number: 3 Primary Etiology: Pressure Ulcer Wound Location: Left Metatarsal head fifth Wound Status: Open Wounding Event: Gradually Appeared Comorbid History: Hypotension, Type II Diabetes Date Acquired: 08/12/2019 Weeks Of Treatment: 5 Clustered Wound: No Wound Measurements Length: (cm) 3.4 Width: (cm) 2.4 Depth: (cm) 0.7 Area: (cm) 6.409 Volume: (cm) 4.486 % Reduction in Area: -13.3% % Reduction in Volume: -694% Epithelialization: None Tunneling: No Undermining: No Wound Description Classification: Unstageable/Unclassified Wound Margin: Distinct, outline attached Exudate Amount: Medium Exudate Type: Serosanguineous Exudate Color: red, brown Foul Odor After Cleansing: No Slough/Fibrino Yes Wound Bed Granulation Amount: None Present (0%) Exposed Structure Necrotic Amount: Large (67-100%) Fascia Exposed: No Necrotic Quality: Eschar, Adherent Slough Fat Layer (Subcutaneous Tissue) Exposed: Yes Tendon Exposed:  No Muscle Exposed: No Joint Exposed: No Bone Exposed: No Treatment Notes Wound #3 (Left Metatarsal head fifth) 1. Cleanse With Soap and water 2. Periwound Care Moisturizing lotion 3. Primary Dressing Applied Iodoflex 4. Secondary Dressing ABD Pad Dry Gauze 6. Support Layer Applied 3 layer compression wrap Notes heel cup to right heel / Government social research officer) Signed: 11/05/2019 4:39:15 PM By: Zenaida Deed RN, BSN Entered By: Zenaida Deed on 11/05/2019 14:30:40 -------------------------------------------------------------------------------- Wound Assessment Details Patient Name: Date of Service: [name], [name] 11/05/2019 1:45 PM Medical Record Number: [id number] Patient Account Number: [id number] Date of Birth/Sex: Treating RN: 1965-12-27 (54 y.o. [name] Primary Care Midori Dado: [name] Other Clinician: Referring Hideo Googe: Treating Tyreka Henneke/Extender: [name] in Treatment: 5 Wound Status Wound Number: 4 Primary Etiology: Pressure Ulcer Wound Location: Left, Lateral Lower Leg Wound Status: Open Wounding Event: Pressure Injury Comorbid History: Hypotension, Type II Diabetes Date Acquired: 08/12/2019 Weeks Of Treatment: 5 Clustered Wound: No Wound Measurements Length: (cm) 2 Width: (cm) 1 Depth: (cm) 0.2 Area: (cm) 1.571 Volume: (cm) 0.314 % Reduction in Area: 46.7% % Reduction in Volume: -6.4% Epithelialization: None Tunneling: No Undermining: No Wound Description Classification: Category/Stage III Wound Margin: Distinct, outline attached Exudate Amount: Medium Exudate Type: Serosanguineous Exudate Color: red, brown Foul Odor After Cleansing: No Slough/Fibrino Yes Wound Bed Granulation Amount: Large (67-100%) Exposed Structure Granulation Quality: Red Fascia Exposed: No Necrotic Amount: Small (1-33%) Fat Layer (Subcutaneous Tissue) Exposed: Yes Necrotic Quality: Adherent Slough Tendon  Exposed: No Muscle Exposed: No Joint Exposed: No Bone Exposed: No Treatment Notes Wound #4 (Left, Lateral Lower Leg) 1. Cleanse With Soap and water 2. Periwound Care Moisturizing lotion 3. Primary Dressing Applied Iodoflex 4. Secondary Dressing ABD Pad Dry Gauze 6. Support Layer Applied 3 layer compression wrap Notes heel cup to right heel / Government social research officer) Signed: 11/05/2019 4:39:15 PM By: Zenaida Deed RN, BSN Entered By: Zenaida Deed on 11/05/2019 14:31:01 -------------------------------------------------------------------------------- Vitals Details Patient Name: Date of Service: [name], [name] 11/05/2019 1:45 PM Medical Record Number: [id number]  Patient Account Number: [id number] Date of Birth/Sex: Treating RN: 01/26/1966 (54 y.o. [name] Primary Care Chandlar Guice: [name] Other Clinician: Referring Chey Cho: Treating Kaile Bixler/Extender: [name] in Treatment: 5 Vital Signs Time Taken: 14:13 Temperature (F): 98 Height (in): 68 Pulse (bpm): 58 Source: Stated Respiratory Rate (breaths/min): 18 Weight (lbs): 235 Blood Pressure (mmHg): 116/67 Source: Stated Reference Range: 80 - 120 mg / dl Body Mass Index (BMI): 35.7 Electronic Signature(s) Signed: 11/05/2019 4:39:15 PM By: Zenaida Deed RN, BSN Entered By: Zenaida Deed on 11/05/2019 14:13:42

## 2019-11-07 NOTE — Progress Notes (Signed)
KOHLE, WINNER ([id number]) Visit Report for 11/05/2019 Debridement Details Patient Name: Date of Service: [name] 11/05/2019 1:45 PM Medical Record Number: [id number] Patient Account Number: [id number] Date of Birth/Sex: Treating RN: 01/18/1966 (54 y.o. M) Primary Care Provider: Juluis Mire Other Clinician: Referring Provider: Treating Provider/Extender: Fleet Contras in Treatment: 5 Debridement Performed for Assessment: Wound #1 Right Metatarsal head fifth Performed By: Physician Ricard Dillon., MD Debridement Type: Debridement Level of Consciousness (Pre-procedure): Awake and Alert Pre-procedure Verification/Time Out Yes - 14:55 Taken: Start Time: 14:55 Pain Control: Other : benzocaine, 20% T Area Debrided (L x W): otal 3.4 (cm) x 2 (cm) = 6.8 (cm) Tissue and other material debrided: Viable, Non-Viable, Eschar, Slough, Subcutaneous, Slough Level: Skin/Subcutaneous Tissue Debridement Description: Excisional Instrument: Curette Bleeding: Moderate Hemostasis Achieved: Pressure End Time: 14:55 Procedural Pain: 0 Post Procedural Pain: 0 Response to Treatment: Procedure was tolerated well Level of Consciousness (Post- Awake and Alert procedure): Post Debridement Measurements of Total Wound Length: (cm) 3.4 Stage: Unstageable/Unclassified Width: (cm) 2 Depth: (cm) 0.7 Volume: (cm) 3.738 Character of Wound/Ulcer Post Debridement: Requires Further Debridement Post Procedure Diagnosis Same as Pre-procedure Electronic Signature(s) Signed: 11/07/2019 6:30:39 AM By: Linton Ham MD Entered By: Linton Ham on 11/05/2019 14:59:03 -------------------------------------------------------------------------------- Debridement Details Patient Name: Date of Service: Corwin Levins, [name] 11/05/2019 1:45 PM Medical Record Number: [id number] Patient Account Number: [id number] Date of Birth/Sex: Treating RN: May 23, 1965 (54 y.o.  M) Primary Care Provider: Juluis Mire Other Clinician: Referring Provider: Treating Provider/Extender: Fleet Contras in Treatment: 5 Debridement Performed for Assessment: Wound #3 Left Metatarsal head fifth Performed By: Physician Ricard Dillon., MD Debridement Type: Debridement Level of Consciousness (Pre-procedure): Awake and Alert Pre-procedure Verification/Time Out Yes - 14:55 Taken: Start Time: 14:55 Pain Control: Other : benzocaine, 20% T Area Debrided (L x W): otal 3.4 (cm) x 2.4 (cm) = 8.16 (cm) Tissue and other material debrided: Viable, Non-Viable, Eschar, Slough, Subcutaneous, Slough Level: Skin/Subcutaneous Tissue Debridement Description: Excisional Instrument: Curette Bleeding: Moderate Hemostasis Achieved: Pressure End Time: 14:55 Procedural Pain: 0 Post Procedural Pain: 0 Response to Treatment: Procedure was tolerated well Level of Consciousness (Post- Awake and Alert procedure): Post Debridement Measurements of Total Wound Length: (cm) 3.4 Stage: Unstageable/Unclassified Width: (cm) 2.4 Depth: (cm) 0.7 Volume: (cm) 4.486 Character of Wound/Ulcer Post Debridement: Requires Further Debridement Post Procedure Diagnosis Same as Pre-procedure Electronic Signature(s) Signed: 11/07/2019 6:30:39 AM By: Linton Ham MD Entered By: Linton Ham on 11/05/2019 14:59:15 -------------------------------------------------------------------------------- HPI Details Patient Name: Date of Service: Corwin Levins, [name] 11/05/2019 1:45 PM Medical Record Number: [id number] Patient Account Number: [id number] Date of Birth/Sex: Treating RN: 1965/12/28 (54 y.o. M) Primary Care Provider: Juluis Mire Other Clinician: Referring Provider: Treating Provider/Extender: Fleet Contras in Treatment: 5 History of Present Illness HPI Description: ADMISSION 10/02/2018 This is a 54 year old Spanish-speaking man who  arrived accompanied by his wife. 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 Apparently 6 weeks ago his wife noted blisters on his bilateral fifth metatarsal heads and heels which she feels was from excessive pressure on the foot rests of his wheelchair. He was seen in the emergency room early in August and then was admitted to hospital from 8/10 through 8/14 with cellulitis of his feet. He was ultimately discharged on Keflex. Arterial studies were done in the hospital that showed an ABI of 1.61 on the right and 1.36 on  the left but triphasic waveforms on the left and biphasic on the right. DVT rule out study was negative. X-ray of the bilateral feet did not show osteomyelitis. They have been dealing with these wounds at home. I think they are using Betadine. He has Medicaid and does not have home health. He comes in today with wounds on his bilateral plantar fifth metatarsal heads Achilles area of both heels and an area on the left lateral leg. His wife explains this as he always laterally rotates his legs when he is sitting in the wheelchair or in bed. She even seemed to believe that before his injury he actually walked on the outside of his feet. 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 Paraplegia T1-T6 8/27; the patient has 4 open wounds mirror-image areas over the plantar fifth metatarsal heads. Area on the right Achilles area and then on the left lateral calf. We have been using Iodoflex. He is on Eliquis 9/2 debridement I did last week caused copious amounts of bleeding which was difficult to control. He is on Eliquis. He has the 2 mirror-image wounds over the plantar fifth metatarsal heads in the area on the right Achilles and then an area on the left lateral calf. Both the fifth metatarsal head wounds on the left lateral calf of necrotic  eschar on the surface. Black discoloration likely partially the silver nitrate we had to use last time 9/9; he arrives back in clinic today with the wounds on the plantar fifth metatarsal heads and exactly the same nonviable situation. He also has an area on the Achilles part of his right heel and the left lateral leg in a very similar situation. Nothing is making much progress I took some time to review his arterial studies from his hospitalization before he came to this clinic. On the right he had noncompressible ABIs with biphasic waveforms. They did not do a TBI in the right. On the left he had noncompressible ABIs at 1.36. However his TBI was 0.70 with triphasic waveforms at the dorsalis pedis he had a DVT rule out study that was negative. We have been using Iodoflex without much progress back in compression as he does not have access to wound care supplies 9/17; he comes back in with the same adherent eschar over both fifth met heads. I am really uncertain about this.. I used Iodoflex and last week switch to Sorbact. The area on the left posterior calf still with adherent debris. The right heel looked better 9/24 unfortunately his interpreter had to leave before we got in the room. Not much change. The right fifth met head is better however the left is not much improved we have been using Iodoflex Electronic Signature(s) Signed: 11/07/2019 6:30:39 AM By: Linton Ham MD Entered By: Linton Ham on 11/05/2019 15:00:25 -------------------------------------------------------------------------------- Physical Exam Details Patient Name: Date of Service: [place] Organ RLO S 11/05/2019 1:45 PM Medical Record Number: [id number] Patient Account Number: [id number] Date of Birth/Sex: Treating RN: 06/28/1965 (54 y.o. M) Primary Care Provider: Juluis Mire Other Clinician: Referring Provider: Treating Provider/Extender: Fleet Contras in Treatment:  5 Constitutional Sitting or standing Blood Pressure is within target range for patient.. Pulse regular and within target range for patient.Marland Kitchen Respirations regular, non-labored and within target range.. Temperature is normal and within the target range for the patient.Marland Kitchen Appears in no distress. Notes Wound exam Fifth metatarsal head wounds again require aggressive debridement.  I think we may be making progress on the right not so much on the left. He has exposed tendon at the base of the wound on the left after an aggressive debridement. I still do not feel I am able to get down to completely healthy looking tissue. Electronic Signature(s) Signed: 11/07/2019 6:30:39 AM By: Linton Ham MD Entered By: Linton Ham on 11/05/2019 15:01:20 -------------------------------------------------------------------------------- Physician Orders Details Patient Name: Date of Service: Corwin Levins, [name] 11/05/2019 1:45 PM Medical Record Number: [id number] Patient Account Number: [id number] Date of Birth/Sex: Treating RN: 01-16-1966 (54 y.o. Marvis Repress Primary Care Provider: Juluis Mire Other Clinician: Referring Provider: Treating Provider/Extender: Fleet Contras in Treatment: 5 Verbal / Phone Orders: No Diagnosis Coding ICD-10 Coding Code Description L97.518 Non-pressure chronic ulcer of other part of right foot with other specified severity L97.528 Non-pressure chronic ulcer of other part of left foot with other specified severity L89.613 Pressure ulcer of right heel, stage 3 L89.623 Pressure ulcer of left heel, stage 3 E11.621 Type 2 diabetes mellitus with foot ulcer L97.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 G82.21 Paraplegia, complete Follow-up Appointments Return Appointment in 1 week. Dressing Change Frequency Do not change entire dressing for one week. - all wounds Skin Barriers/Peri-Wound Care Moisturizing  lotion - both legs. Wound Cleansing May shower with protection. - Dressing cannot get wet. Use cast protector if taking a shower Primary Wound Dressing Wound #1 Right Metatarsal head fifth Iodoflex Wound #2 Right Calcaneus Iodoflex Wound #3 Left Metatarsal head fifth Iodoflex Wound #4 Left,Lateral Lower Leg Iodoflex Secondary Dressing Wound #1 Right Metatarsal head fifth Dry Gauze Wound #2 Right Calcaneus Dry Gauze Heel Cup Wound #3 Left Metatarsal head fifth Dry Gauze Wound #4 Left,Lateral Lower Leg Dry Gauze ABD pad Edema Control 3 Layer Compression System - Bilateral Elevate legs to the level of the heart or above for 30 minutes daily and/or when sitting, a frequency of: - throughout the day Off-Loading Other: - avoid pressure to sites. Electronic Signature(s) Signed: 11/05/2019 5:26:06 PM By: Kela Millin Signed: 11/07/2019 6:30:39 AM By: Linton Ham MD Entered By: Kela Millin on 11/05/2019 14:59:08 -------------------------------------------------------------------------------- Problem List Details Patient Name: Date of Service: Corwin Levins, [name] 11/05/2019 1:45 PM Medical Record Number: [id number] Patient Account Number: [id number] Date of Birth/Sex: Treating RN: 12-30-1965 (54 y.o. M) Primary Care Provider: Juluis Mire Other Clinician: Referring Provider: Treating Provider/Extender: Fleet Contras in Treatment: 5 Active Problems ICD-10 Encounter Code Description Active Date MDM Diagnosis L97.518 Non-pressure chronic ulcer of other part of right foot with other specified 10/01/2019 No Yes severity L97.528 Non-pressure chronic ulcer of other part of left foot with other specified 10/01/2019 No Yes severity L89.613 Pressure ulcer of right heel, stage 3 10/01/2019 No Yes L89.623 Pressure ulcer of left heel, stage 3 10/01/2019 No Yes E11.621 Type 2 diabetes mellitus with foot ulcer 10/01/2019 No Yes L97.228  Non-pressure chronic ulcer of left calf with other specified severity 10/01/2019 No Yes G82.21 Paraplegia, complete 10/01/2019 No Yes Inactive Problems Resolved Problems Electronic Signature(s) Signed: 11/07/2019 6:30:39 AM By: Linton Ham MD Entered By: Linton Ham on 11/05/2019 14:58:34 -------------------------------------------------------------------------------- Progress Note Details Patient Name: Date of Service: Corwin Levins, [name] 11/05/2019 1:45 PM Medical Record Number: [id number] Patient Account Number: [id number] Date of Birth/Sex: Treating RN: 1965-11-14 (54 y.o. M) Primary Care Provider: Juluis Mire Other Clinician: Referring Provider: Treating Provider/Extender: Fleet Contras in Treatment: 5 Subjective History of  Present Illness (HPI) ADMISSION 10/02/2018 This is a 54 year old Spanish-speaking man who arrived accompanied by his wife. 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 Apparently 6 weeks ago his wife noted blisters on his bilateral fifth metatarsal heads and heels which she feels was from excessive pressure on the foot rests of his wheelchair. He was seen in the emergency room early in August and then was admitted to hospital from 8/10 through 8/14 with cellulitis of his feet. He was ultimately discharged on Keflex. Arterial studies were done in the hospital that showed an ABI of 1.61 on the right and 1.36 on the left but triphasic waveforms on the left and biphasic on the right. DVT rule out study was negative. X-ray of the bilateral feet did not show osteomyelitis. They have been dealing with these wounds at home. I think they are using Betadine. He has Medicaid and does not have home health. He comes in today with wounds on his bilateral plantar fifth metatarsal heads Achilles area of both heels and an area on the left lateral leg. His wife  explains this as he always laterally rotates his legs when he is sitting in the wheelchair or in bed. She even seemed to believe that before his injury he actually walked on the outside of his feet. 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 Paraplegia T1-T6 8/27; the patient has 4 open wounds mirror-image areas over the plantar fifth metatarsal heads. Area on the right Achilles area and then on the left lateral calf. We have been using Iodoflex. He is on Eliquis 9/2 debridement I did last week caused copious amounts of bleeding which was difficult to control. He is on Eliquis. He has the 2 mirror-image wounds over the plantar fifth metatarsal heads in the area on the right Achilles and then an area on the left lateral calf. Both the fifth metatarsal head wounds on the left lateral calf of necrotic eschar on the surface. Black discoloration likely partially the silver nitrate we had to use last time 9/9; he arrives back in clinic today with the wounds on the plantar fifth metatarsal heads and exactly the same nonviable situation. He also has an area on the Achilles part of his right heel and the left lateral leg in a very similar situation. Nothing is making much progress I took some time to review his arterial studies from his hospitalization before he came to this clinic. On the right he had noncompressible ABIs with biphasic waveforms. They did not do a TBI in the right. On the left he had noncompressible ABIs at 1.36. However his TBI was 0.70 with triphasic waveforms at the dorsalis pedis he had a DVT rule out study that was negative. We have been using Iodoflex without much progress back in compression as he does not have access to wound care supplies 9/17; he comes back in with the same adherent eschar over both fifth met heads. I am really uncertain about this.. I used Iodoflex and last week switch to Sorbact. The area  on the left posterior calf still with adherent debris. The right heel looked better 9/24 unfortunately his interpreter had to leave before we got in the room. Not much change. The right fifth met head is better however the left is not much improved we have been using Iodoflex Objective Constitutional Sitting or standing Blood  Pressure is within target range for patient.. Pulse regular and within target range for patient.Marland Kitchen Respirations regular, non-labored and within target range.. Temperature is normal and within the target range for the patient.Marland Kitchen Appears in no distress. Vitals Time Taken: 2:13 PM, Height: 68 in, Source: Stated, Weight: 235 lbs, Source: Stated, BMI: 35.7, Temperature: 98 F, Pulse: 58 bpm, Respiratory Rate: 18 breaths/min, Blood Pressure: 116/67 mmHg. General Notes: Wound exam ooFifth metatarsal head wounds again require aggressive debridement. I think we may be making progress on the right not so much on the left. He has exposed tendon at the base of the wound on the left after an aggressive debridement. I still do not feel I am able to get down to completely healthy looking tissue. Integumentary (Hair, Skin) Wound #1 status is Open. Original cause of wound was Gradually Appeared. The wound is located on the Right Metatarsal head fifth. The wound measures 3.4cm length x 2cm width x 0.7cm depth; 5.341cm^2 area and 3.738cm^3 volume. There is Fat Layer (Subcutaneous Tissue) exposed. There is no tunneling or undermining noted. There is a medium amount of serosanguineous drainage noted. The wound margin is distinct with the outline attached to the wound base. There is no granulation within the wound bed. There is a large (67-100%) amount of necrotic tissue within the wound bed including Adherent Slough. Wound #2 status is Open. Original cause of wound was Gradually Appeared. The wound is located on the Right Calcaneus. The wound measures 0.2cm length x 0.7cm width x 0.1cm depth;  0.11cm^2 area and 0.011cm^3 volume. There is Fat Layer (Subcutaneous Tissue) exposed. There is no tunneling or undermining noted. There is a small amount of serosanguineous drainage noted. The wound margin is flat and intact. There is large (67-100%) red granulation within the wound bed. There is no necrotic tissue within the wound bed. Wound #3 status is Open. Original cause of wound was Gradually Appeared. The wound is located on the Left Metatarsal head fifth. The wound measures 3.4cm length x 2.4cm width x 0.7cm depth; 6.409cm^2 area and 4.486cm^3 volume. There is Fat Layer (Subcutaneous Tissue) exposed. There is no tunneling or undermining noted. There is a medium amount of serosanguineous drainage noted. The wound margin is distinct with the outline attached to the wound base. There is no granulation within the wound bed. There is a large (67-100%) amount of necrotic tissue within the wound bed including Eschar and Adherent Slough. Wound #4 status is Open. Original cause of wound was Pressure Injury. The wound is located on the Left,Lateral Lower Leg. The wound measures 2cm length x 1cm width x 0.2cm depth; 1.571cm^2 area and 0.314cm^3 volume. There is Fat Layer (Subcutaneous Tissue) exposed. There is no tunneling or undermining noted. There is a medium amount of serosanguineous drainage noted. The wound margin is distinct with the outline attached to the wound base. There is large (67-100%) red granulation within the wound bed. There is a small (1-33%) amount of necrotic tissue within the wound bed including Adherent Slough. 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 There  was a Excisional Skin/Subcutaneous Tissue Debridement with a total area of 6.8 sq cm performed by Ricard Dillon., MD. With the following instrument(s): Curette to remove Viable and Non-Viable tissue/material. Material removed includes Eschar, Subcutaneous Tissue, and Slough after achieving pain control using Other (benzocaine, 20%). No specimens were taken. A time out was conducted at 14:55, prior to the start of the procedure. A Moderate amount of bleeding was controlled with Pressure. The procedure was tolerated well with a pain level of 0 throughout and a pain level of 0 following the procedure. Post Debridement Measurements: 3.4cm length x 2cm width x 0.7cm depth; 3.738cm^3 volume. Post debridement Stage noted as Unstageable/Unclassified. Character of Wound/Ulcer Post Debridement requires further debridement. Post procedure Diagnosis Wound #1: Same as Pre-Procedure Pre-procedure diagnosis of Wound #1 is a Pressure Ulcer located on the Right Metatarsal head fifth . There was a Three Layer Compression Therapy Procedure by Deon Pilling, RN. Post procedure Diagnosis Wound #1: Same as Pre-Procedure Wound #3 Pre-procedure diagnosis of Wound #3 is a Pressure Ulcer located on the Left Metatarsal head fifth . There was a Excisional Skin/Subcutaneous Tissue Debridement with a total area of 8.16 sq cm performed by Ricard Dillon., MD. With the following instrument(s): Curette to remove Viable and Non-Viable tissue/material. Material removed includes Eschar, Subcutaneous Tissue, and Slough after achieving pain control using Other (benzocaine, 20%). No specimens were taken. A time out was conducted at 14:55, prior to the start of the procedure. A Moderate amount of bleeding was controlled with Pressure. The procedure was tolerated  well with a pain level of 0 throughout and a pain level of 0 following the procedure. Post Debridement Measurements: 3.4cm length x 2.4cm width x 0.7cm depth; 4.486cm^3 volume. Post debridement Stage noted as Unstageable/Unclassified. Character of Wound/Ulcer Post Debridement requires further debridement. Post procedure Diagnosis Wound #3: Same as Pre-Procedure Pre-procedure diagnosis of Wound #3 is a Pressure Ulcer located on the Left Metatarsal head fifth . There was a Three Layer Compression Therapy Procedure by Deon Pilling, RN. Post procedure Diagnosis Wound #3: Same as Pre-Procedure Wound #2 Pre-procedure diagnosis of Wound #2 is a Pressure Ulcer located on the Right Calcaneus . There was a Three Layer Compression Therapy Procedure by Deon Pilling, RN. Post procedure Diagnosis Wound #2: Same as Pre-Procedure Wound #4 Pre-procedure diagnosis of Wound #4 is a Pressure Ulcer located on the Left,Lateral Lower Leg . There was a Three Layer Compression Therapy Procedure by Deon Pilling, RN. Post procedure Diagnosis Wound #4: Same as Pre-Procedure Plan Follow-up Appointments: Return Appointment in 1 week. Dressing Change Frequency: Do not change entire dressing for one week. - all wounds Skin Barriers/Peri-Wound Care: Moisturizing lotion - both legs. Wound Cleansing: May shower with protection. - Dressing cannot get wet. Use cast protector if taking a shower Primary Wound Dressing: Wound #1 Right Metatarsal head fifth: Iodoflex Wound #2 Right Calcaneus: Iodoflex Wound #3 Left Metatarsal head fifth: Iodoflex Wound #4 Left,Lateral Lower Leg: Iodoflex Secondary Dressing: Wound #1 Right Metatarsal head fifth: Dry Gauze Wound #2 Right Calcaneus: Dry Gauze Heel Cup Wound #3 Left Metatarsal head fifth: Dry Gauze Wound #4 Left,Lateral Lower Leg: Dry Gauze ABD pad Edema Control: 3 Layer Compression System - Bilateral Elevate legs to the level of the heart or above for 30  minutes daily and/or when sitting, a frequency of: - throughout the day Off-Loading: Other: - avoid pressure to sites. 1. Continue with Iodoflex to both wound areas on his plantar feet 2. T has a wound on the  left lateral lower leg and the right calcaneus. oe 3. I have reviewed his arterial studies which did not suggest significant ischemia. He does have significant edema poorly controlled and that may be complicating things. I wonder about increasing his compression especially as applies to the leg and the heel wounds Electronic Signature(s) Signed: 11/07/2019 6:30:39 AM By: Linton Ham MD Entered By: Linton Ham on 11/05/2019 15:02:29 -------------------------------------------------------------------------------- SuperBill Details Patient Name: Date of Service: Corwin Levins, [place] S 11/05/2019 Medical Record Number: [id number] Patient Account Number: [id number] Date of Birth/Sex: Treating RN: 01-21-1966 (54 y.o. 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, complete Facility Procedures CPT4 Code: [id number] Description: 62130 - DEB SUBQ TISSUE 20 SQ CM/< ICD-10 Diagnosis Description L97.518 Non-pressure chronic ulcer of other part of right foot with other specified sev L97.528 Non-pressure chronic ulcer of other part of left foot with other specified seve Modifier: erity rity Quantity:  1 Physician Procedures : CPT4 Code Description Modifier [id number] - WC PHYS SUBQ TISS 20 SQ CM ICD-10 Diagnosis Description L97.518 Non-pressure chronic ulcer of other part of right foot with other specified severity L97.528 Non-pressure chronic ulcer of other part of left  foot with other specified severity Quantity: 1 Electronic Signature(s) Signed: 11/05/2019 5:26:06 PM By: Kela Millin Signed: 11/07/2019 6:30:39 AM By: Linton Ham MD Entered By: Kela Millin on 11/05/2019 15:13:20

## 2019-11-12 ENCOUNTER — Encounter (HOSPITAL_BASED_OUTPATIENT_CLINIC_OR_DEPARTMENT_OTHER): Payer: Medicaid Other | Attending: Internal Medicine | Admitting: Internal Medicine

## 2019-11-12 ENCOUNTER — Other Ambulatory Visit: Payer: Self-pay

## 2019-11-12 DIAGNOSIS — G8221 Paraplegia, complete: Secondary | ICD-10-CM | POA: Insufficient documentation

## 2019-11-12 DIAGNOSIS — L97512 Non-pressure chronic ulcer of other part of right foot with fat layer exposed: Secondary | ICD-10-CM | POA: Insufficient documentation

## 2019-11-12 DIAGNOSIS — E11621 Type 2 diabetes mellitus with foot ulcer: Secondary | ICD-10-CM | POA: Diagnosis not present

## 2019-11-12 DIAGNOSIS — L89623 Pressure ulcer of left heel, stage 3: Secondary | ICD-10-CM | POA: Diagnosis not present

## 2019-11-12 DIAGNOSIS — L89613 Pressure ulcer of right heel, stage 3: Secondary | ICD-10-CM | POA: Insufficient documentation

## 2019-11-12 DIAGNOSIS — L97522 Non-pressure chronic ulcer of other part of left foot with fat layer exposed: Secondary | ICD-10-CM | POA: Insufficient documentation

## 2019-11-12 DIAGNOSIS — L97222 Non-pressure chronic ulcer of left calf with fat layer exposed: Secondary | ICD-10-CM | POA: Diagnosis not present

## 2019-11-12 NOTE — Progress Notes (Signed)
[name], [name] ([id number]) Visit Report for 11/12/2019 Arrival Information Details Patient Name: Date of Service: [name] 11/12/2019 1:15 PM Medical Record Number: [id number] Patient Account Number: [id number] Date of Birth/Sex: Treating RN: November 27, 1965 (54 y.o. [name] Primary Care [name]: [name] Other Clinician: Referring [name]: Treating [name]/Extender: [name]: 6 Visit Information History Since Last Visit Added or deleted any medications: No Patient Arrived: Wheel Chair Any new allergies or adverse reactions: No Arrival Time: 13:00 Had a fall or experienced change in No Accompanied By: wife activities of daily living that may affect Transfer Assistance: None risk of falls: Patient Identification Verified: Yes Signs or symptoms of abuse/neglect since last visito No Secondary Verification Process Completed: Yes Hospitalized since last visit: No Patient Requires Transmission-Based Precautions: No Implantable device outside of the clinic excluding No Patient Has Alerts: Yes cellular tissue based products placed in the center Patient Alerts: Translator Required since last visit: [name] ABI 1.61 Has Dressing in Place as Prescribed: Yes Left ABI 1.36 Has Compression in Place as Prescribed: Yes Pain Present Now: No Electronic Signature(s) Signed: 11/12/2019 4:18:11 PM By: Shawn Stall Entered By: Shawn Stall on 11/12/2019 13:21:21 -------------------------------------------------------------------------------- Compression Therapy Details Patient Name: Date of Service: [name] RLO S 11/12/2019 1:15 PM Medical Record Number: [id number] Patient Account Number: [id number] Date of Birth/Sex: Treating RN: Nov 11, 1965 (54 y.o. [name] Primary Care Anaysia Germer: [name] Other Clinician: Referring [name]: Treating [name]/Extender: [name] in  [name]: 6 Compression Therapy Performed for Wound Assessment: Wound #1 [name] Metatarsal head fifth Performed By: Clinician Shawn Stall, RN Compression Type: Four Layer Post Procedure Diagnosis Same as Pre-procedure Electronic Signature(s) Signed: 11/12/2019 4:10:39 PM By: [name] Entered By: [name] on 11/12/2019 13:41:16 -------------------------------------------------------------------------------- Compression Therapy Details Patient Name: Date of Service: [name] 11/12/2019 1:15 PM Medical Record Number: [id number] Patient Account Number: [id number] Date of Birth/Sex: Treating RN: Apr 24, 1965 (54 y.o. [name] Primary Care Alantis Bethune: [name] Other Clinician: Referring [name]: Treating [name]: [name]: 6 Compression Therapy Performed for Wound Assessment: Wound #2 [name] Calcaneus Performed By: Clinician Shawn Stall, RN Compression Type: Four Layer Post Procedure Diagnosis Same as Pre-procedure Electronic Signature(s) Signed: 11/12/2019 4:10:39 PM By: [name] Entered By: [name] on 11/12/2019 13:41:16 -------------------------------------------------------------------------------- Compression Therapy Details Patient Name: Date of Service: [name] 11/12/2019 1:15 PM Medical Record Number: [id number] Patient Account Number: [id number] Date of Birth/Sex: Treating RN: 12/27/1965 (54 y.o. [name] Primary Care Akeila Lana: [name] Other Clinician: Referring Adriana Lina: Treating Halia Franey/Extender: [name]: 6 Compression Therapy Performed for Wound Assessment: Wound #3 Left Metatarsal head fifth Performed By: Clinician Shawn Stall, RN Compression Type: Four Layer Post Procedure Diagnosis Same as Pre-procedure Electronic Signature(s) Signed: 11/12/2019 4:10:39 PM By:  [name] Entered By: [name] on 11/12/2019 13:41:16 -------------------------------------------------------------------------------- Compression Therapy Details Patient Name: Date of Service: [name] 11/12/2019 1:15 PM Medical Record Number: [id number] Patient Account Number: [id number] Date of Birth/Sex: Treating RN: 1965-08-21 (54 y.o. [name] Primary Care Priyansh Pry: [name] Other Clinician: Referring Sherman Lipuma: Treating Jasmia Angst/Extender: [name]: 6 Compression Therapy Performed for Wound Assessment: Wound #4 Left,Lateral Lower Leg Performed By: Clinician Shawn Stall, RN Compression Type: Four Layer Post Procedure Diagnosis Same as Pre-procedure Electronic Signature(s) Signed: 11/12/2019 4:10:39 PM By: [name] Entered By: [name] on 11/12/2019  13:41:16 -------------------------------------------------------------------------------- Encounter Discharge Information Details Patient Name: Date of Service: [name] 11/12/2019 1:15 PM Medical Record Number: [id number] Patient Account Number: [id number] Date of Birth/Sex: Treating RN: 03/29/1965 (54 y.o. [name] Primary Care Dreonna Hussein: [name] Other Clinician: Referring Holger Sokolowski: Treating [name]/Extender: [name]: 6 Encounter Discharge Information Items Post Procedure Vitals Discharge Condition: Stable Temperature (F): 97.7 Ambulatory Status: Wheelchair Pulse (bpm): 62 Discharge Destination: Home Respiratory Rate (breaths/min): 18 Transportation: Other Blood Pressure (mmHg): 106/70 Accompanied By: spouse Schedule Follow-up Appointment: Yes Clinical Summary of Care: Patient Declined Notes transportation service Electronic Signature(s) Signed: 11/12/2019 4:31:44 PM By: Harry Deed RN, BSN Entered By: [name] on  11/12/2019 14:21:52 -------------------------------------------------------------------------------- Lower Extremity Assessment Details Patient Name: Date of Service: [name] RLO S 11/12/2019 1:15 PM Medical Record Number: [id number] Patient Account Number: [id number] Date of Birth/Sex: Treating RN: 1965-12-30 (54 y.o. [name] Primary Care Tajanay Hurley: [name] Other Clinician: Referring Tracen Mahler: Treating Cicilia Clinger/Extender: [name]: 6 Edema Assessment Assessed: Kyra Searles: Yes] Franne Forts: Yes] Edema: [Left: Yes] [[name]: Yes] Calf Left: [name]: Point of Measurement: From Medial Instep 49.6 cm 44.5 cm Ankle Left: [name]: Point of Measurement: From Medial Instep 27 cm 28 cm Electronic Signature(s) Signed: 11/12/2019 4:18:11 PM By: Shawn Stall Entered By: Shawn Stall on 11/12/2019 13:22:09 -------------------------------------------------------------------------------- Multi Wound Chart Details Patient Name: Date of Service: [name] RLO S 11/12/2019 1:15 PM Medical Record Number: [id number] Patient Account Number: [id number] Date of Birth/Sex: Treating RN: 1966/01/11 (54 y.o. [name] Primary Care Austin Herd: [name] Other Clinician: Referring Tanner Vigna: Treating Cammi Consalvo/Extender: [name]: 6 Vital Signs Height(in): 68 Pulse(bpm): 62 Weight(lbs): 235 Blood Pressure(mmHg): 106/70 Body Mass Index(BMI): 36 Temperature(F): 97.7 Respiratory Rate(breaths/min): 18 Photos: [1:No Photos [name] Metatarsal head fifth] [2:No Photos [name] Calcaneus] [3:No Photos Left Metatarsal head fifth] Wound Location: [1:Gradually Appeared] [2:Gradually Appeared] [3:Gradually Appeared] Wounding Event: [1:Pressure Ulcer] [2:Pressure Ulcer] [3:Pressure Ulcer] Primary Etiology: [1:Hypotension, Type II Diabetes] [2:Hypotension, Type II Diabetes] [3:Hypotension, Type II  Diabetes] Comorbid History: [1:08/12/2019] [2:08/12/2019] [3:08/12/2019] Date Acquired: [1:6] [2:6] [3:6] Weeks of [name]: [1:Open] [2:Open] [3:Open] Wound Status: [1:3.1x1.8x0.8] [2:0.3x0.8x0.1] [3:3.8x2.5x1.1] Measurements L x W x D (cm) [1:4.383] [2:0.188] [3:7.461] A (cm) : rea [1:3.506] [2:0.019] [3:8.207] Volume (cm) : [1:-2.60%] [2:88.00%] [3:-31.90%] % Reduction in A rea: [1:-721.10%] [2:93.90%] [3:-1352.60%] % Reduction in Volume: [1:Unstageable/Unclassified] [2:Category/Stage III] [3:Unstageable/Unclassified] Classification: [1:Medium] [2:Small] [3:Medium] Exudate A mount: [1:Serosanguineous] [2:Serosanguineous] [3:Serosanguineous] Exudate Type: [1:red, brown] [2:red, brown] [3:red, brown] Exudate Color: [1:Distinct, outline attached] [2:Flat and Intact] [3:Distinct, outline attached] Wound Margin: [1:Medium (34-66%)] [2:Medium (34-66%)] [3:Medium (34-66%)] Granulation A mount: [1:Red, Pink] [2:Red] [3:Red, Pink] Granulation Quality: [1:Medium (34-66%)] [2:Medium (34-66%)] [3:Medium (34-66%)] Necrotic A mount: [1:Fat Layer (Subcutaneous Tissue): Yes Fat Layer (Subcutaneous Tissue): Yes Fat Layer (Subcutaneous Tissue): Yes] Exposed Structures: [1:Fascia: No Tendon: No Muscle: No Joint: No Bone: No Small (1-33%)] [2:Fascia: No Tendon: No Muscle: No Joint: No Bone: No Medium (34-66%)] [3:Fascia: No Tendon: No Muscle: No Joint: No Bone: No Small (1-33%)] Epithelialization: [1:N/A] [2:N/A] [3:Debridement - Excisional] Debridement: Pre-procedure Verification/Time Out N/A [2:N/A] [3:13:39] Taken: [1:N/A] [2:N/A] [3:Lidocaine 5% topical ointment] Pain Control: [1:N/A] [2:N/A] [3:Necrotic/Eschar, Muscle] Tissue Debrided: [1:N/A] [2:N/A] [3:Skin/Subcutaneous Tissue/Muscle] Level: [1:N/A] [2:N/A] [3:9.5] Debridement A (sq cm): [1:rea N/A] [2:N/A] [3:Curette] Instrument: [1:N/A] [2:N/A] [3:Moderate] Bleeding: [1:N/A] [2:N/A] [3:Pressure] Hemostasis A chieved: [1:N/A] [2:N/A]  [3:0] Procedural Pain: [1:N/A] [2:N/A] [3:0] Post Procedural Pain: [1:N/A] [2:N/A] [3:Procedure was tolerated well]  Debridement [name] Response: [1:N/A] [2:N/A] [3:3.8x2.5x1.1] Post Debridement Measurements L x W x D (cm) [1:N/A] [2:N/A] [3:8.207] Post Debridement Volume: (cm) [1:N/A] [2:N/A] [3:Category/Stage IV] Post Debridement Stage: [1:Compression Therapy] [2:Compression Therapy] [3:Compression Therapy] Procedures Performed: [2:4 N/A] [3:Debridement N/A] Photos: [1:No Photos Left, Lateral Lower Leg] [2:N/A N/A] [3:N/A N/A] Wound Location: [1:Pressure Injury] [2:N/A] [3:N/A] Wounding Event: [1:Pressure Ulcer] [2:N/A] [3:N/A] Primary Etiology: [1:Hypotension, Type II Diabetes] [2:N/A N/A] Comorbid History: [1:08/12/2019] [2:N/A N/A] Date Acquired: [1:6] [2:N/A N/A] Weeks of [name]: [1:Open] [2:N/A N/A] Wound Status: [1:1.4x0.7x0.1] [2:N/A N/A] Measurements L x W x D (cm) [1:0.77] [2:N/A N/A] A (cm) : rea [1:0.077] [2:N/A N/A] Volume (cm) : [1:73.90%] [2:N/A N/A] % Reduction in A rea: [1:73.90%] [2:N/A N/A] % Reduction in Volume: [1:Category/Stage III] [2:N/A N/A] Classification: [1:Medium] [2:N/A N/A] Exudate A mount: [1:Serosanguineous] [2:N/A N/A] Exudate Type: [1:red, brown] [2:N/A N/A] Exudate Color: [1:Distinct, outline attached] [2:N/A N/A] Wound Margin: [1:Medium (34-66%)] [2:N/A N/A] Granulation A mount: [1:Red] [2:N/A N/A] Granulation Quality: [1:Medium (34-66%)] [2:N/A N/A] Necrotic A mount: [1:Fat Layer (Subcutaneous Tissue): Yes N/A] [2:N/A] Exposed Structures: [1:Fascia: No Tendon: No Muscle: No Joint: No Bone: No Medium (34-66%)] [2:N/A N/A] Epithelialization: [1:N/A] [2:N/A N/A] Debridement: [1:N/A] [2:N/A N/A] Pain Control: [1:N/A] [2:N/A N/A] Tissue Debrided: [1:N/A] [2:N/A N/A] Level: [1:N/A] [2:N/A N/A] Debridement A (sq cm): [1:rea N/A] [2:N/A N/A] Instrument: [1:N/A] [2:N/A N/A] Bleeding: [1:N/A] [2:N/A N/A] Hemostasis A chieved: [1:N/A] [2:N/A  N/A] Procedural Pain: [1:N/A] [2:N/A N/A] Post Procedural Pain: Debridement [name] Response: N/A [2:N/A N/A] Post Debridement Measurements L x N/A [2:N/A N/A] W x D (cm) [1:N/A] [2:N/A N/A] Post Debridement Volume: (cm) [1:N/A] [2:N/A N/A] Post Debridement Stage: [1:Compression Therapy] [2:N/A N/A] [name] Notes Wound #1 ([name] Metatarsal head fifth) 2. Periwound Care Barrier cream 3. Primary Dressing Applied Iodoflex 4. Secondary Dressing ABD Pad Dry Gauze Heel Cup 6. Support Layer Applied 4 layer compression wrap 7. Footwear/Offloading device applied Multipodus Splint/Boot Wound #2 ([name] Calcaneus) 2. Periwound Care Barrier cream 3. Primary Dressing Applied Iodoflex 4. Secondary Dressing ABD Pad Dry Gauze Heel Cup 6. Support Layer Applied 4 layer compression wrap 7. Footwear/Offloading device applied Multipodus Splint/Boot Wound #3 (Left Metatarsal head fifth) 2. Periwound Care Barrier cream 3. Primary Dressing Applied Iodoflex 4. Secondary Dressing ABD Pad Dry Gauze Heel Cup 6. Support Layer Applied 4 layer compression wrap 7. Footwear/Offloading device applied Multipodus Splint/Boot Wound #4 (Left, Lateral Lower Leg) 2. Periwound Care Barrier cream 3. Primary Dressing Applied Iodoflex 4. Secondary Dressing ABD Pad Dry Gauze Heel Cup 6. Support Layer Applied 4 layer compression wrap 7. Footwear/Offloading device applied Multipodus Splint/Boot Electronic Signature(s) Signed: 11/12/2019 4:10:39 PM By: [name] Signed: 11/12/2019 4:24:19 PM By: Baltazar Najjar MD Entered By: Baltazar Najjar on 11/12/2019 14:36:50 -------------------------------------------------------------------------------- Multi-Disciplinary Care Plan Details Patient Name: Date of Service: Harry Moro, [place] RLO S 11/12/2019 1:15 PM Medical Record Number: [id number] Patient Account Number: [id number] Date of Birth/Sex: Treating RN: 15-Jun-1965 (54 y.o. [name] Primary Care Mylo Driskill: [name] Other Clinician: Referring Damarco Keysor: Treating Jameison Haji/Extender: [name]: 6 Active Inactive Nutrition Nursing Diagnoses: Impaired glucose control: actual or potential Goals: Patient/caregiver verbalizes understanding of need to maintain therapeutic glucose control per primary care physician Date Initiated: 10/01/2019 Target Resolution Date: 12/03/2019 Goal Status: Active Interventions: Provide education on elevated blood sugars and impact on wound healing Notes: Pressure Nursing Diagnoses: Knowledge deficit related to causes and risk factors for pressure ulcer development Goals: Patient/caregiver will verbalize risk factors for pressure ulcer development Date Initiated:  10/01/2019 Target Resolution Date: 12/03/2019 Goal Status: Active Interventions: Provide education on pressure ulcers Notes: Wound/Skin Impairment Nursing Diagnoses: Impaired tissue integrity Goals: Ulcer/skin breakdown will have a volume reduction of 50% by week 8 Date Initiated: 10/01/2019 Target Resolution Date: 12/03/2019 Goal Status: Active Interventions: Provide education on ulcer and skin care Notes: Electronic Signature(s) Signed: 11/12/2019 4:10:39 PM By: [name] Entered By: [name] on 11/12/2019 13:14:24 -------------------------------------------------------------------------------- Pain Assessment Details Patient Name: Date of Service: Harry Moro, [name] 11/12/2019 1:15 PM Medical Record Number: [id number] Patient Account Number: [id number] Date of Birth/Sex: Treating RN: [birth year] (54 y.o. [name] Primary Care Jadier Rockers: [name] Other Clinician: Referring Ailen Strauch: Treating Joley Utecht/Extender: [name]: 6 Active Problems Location of Pain Severity and Description of Pain Patient Has Paino No Site  Locations Rate the pain. Current Pain Level: 0 Pain Management and Medication Current Pain Management: Medication: No Cold Application: No Rest: No Massage: No Activity: No T.E.N.S.: No Heat Application: No Leg drop or elevation: No Is the Current Pain Management Adequate: Adequate How does your wound impact your activities of daily livingo Sleep: No Bathing: No Appetite: No Relationship With Others: No Bladder Continence: No Emotions: No Bowel Continence: No Work: No Toileting: No Drive: No Dressing: No Hobbies: No Electronic Signature(s) Signed: 11/12/2019 4:18:11 PM By: Shawn Stall Entered By: Shawn Stall on 11/12/2019 13:21:44 -------------------------------------------------------------------------------- Patient/Caregiver Education Details Patient Name: Date of Service: [name] RLO S 10/1/2021andnbsp1:15 PM Medical Record Number: [id number] Patient Account Number: [id number] Date of Birth/Gender: Treating RN: 1965/08/31 (54 y.o. [name] Primary Care Physician: [name] Other Clinician: Referring Physician: Treating Physician/Extender: [name]: 6 Education Assessment Education Provided To: Patient Education Topics Provided Wound/Skin Impairment: Handouts: Caring for Your Ulcer Methods: Explain/Verbal Responses: State content correctly Electronic Signature(s) Signed: 11/12/2019 4:10:39 PM By: [name] Entered By: [name] on 11/12/2019 13:14:37 -------------------------------------------------------------------------------- Wound Assessment Details Patient Name: Date of Service: Harry Moro, [name] 11/12/2019 1:15 PM Medical Record Number: [id number] Patient Account Number: [id number] Date of Birth/Sex: Treating RN: 09/05/1965 (54 y.o. [name] Primary Care Joseantonio Dittmar: [name] Other Clinician: Referring Yazlynn Birkeland: Treating Tyjai Charbonnet/Extender:  [name]: 6 Wound Status Wound Number: 1 Primary Etiology: Pressure Ulcer Wound Location: [name] Metatarsal head fifth Wound Status: Open Wounding Event: Gradually Appeared Comorbid History: Hypotension, Type II Diabetes Date Acquired: 08/12/2019 Weeks Of [name]: 6 Clustered Wound: No Wound Measurements Length: (cm) 3.1 Width: (cm) 1.8 Depth: (cm) 0.8 Area: (cm) 4.383 Volume: (cm) 3.506 % Reduction in Area: -2.6% % Reduction in Volume: -721.1% Epithelialization: Small (1-33%) Tunneling: No Undermining: No Wound Description Classification: Unstageable/Unclassified Wound Margin: Distinct, outline attached Exudate Amount: Medium Exudate Type: Serosanguineous Exudate Color: red, brown Foul Odor After Cleansing: No Slough/Fibrino Yes Wound Bed Granulation Amount: Medium (34-66%) Exposed Structure Granulation Quality: Red, Pink Fascia Exposed: No Necrotic Amount: Medium (34-66%) Fat Layer (Subcutaneous Tissue) Exposed: Yes Necrotic Quality: Adherent Slough Tendon Exposed: No Muscle Exposed: No Joint Exposed: No Bone Exposed: No [name] Notes Wound #1 ([name] Metatarsal head fifth) 2. Periwound Care Barrier cream 3. Primary Dressing Applied Iodoflex 4. Secondary Dressing ABD Pad Dry Gauze Heel Cup 6. Support Layer Applied 4 layer compression wrap 7. Footwear/Offloading device applied Multipodus Splint/Boot Electronic Signature(s) Signed: 11/12/2019 4:18:11 PM By: Shawn Stall Entered By: Shawn Stall on 11/12/2019 13:22:33 -------------------------------------------------------------------------------- Wound Assessment Details Patient Name: Date of Service: [name] RLO S 11/12/2019 1:15 PM Medical  Record Number: [id number] Patient Account Number: [id number] Date of Birth/Sex: Treating RN: 30-Sep-1965 (54 y.o. Harry SoursM) Deaton, Bobbi Primary Care Davian Hanshaw: Harry PasseEdwards, Michelle Other Clinician: Referring  Anakaren Campion: Treating Reinaldo Helt/Extender: Harry Brashobson, Michael Edwards, Michelle Weeks in [name]: 6 Wound Status Wound Number: 2 Primary Etiology: Pressure Ulcer Wound Location: [name] Calcaneus Wound Status: Open Wounding Event: Gradually Appeared Comorbid History: Hypotension, Type II Diabetes Date Acquired: 08/12/2019 Weeks Of [name]: 6 Clustered Wound: No Wound Measurements Length: (cm) 0.3 Width: (cm) 0.8 Depth: (cm) 0.1 Area: (cm) 0.188 Volume: (cm) 0.019 % Reduction in Area: 88% % Reduction in Volume: 93.9% Epithelialization: Medium (34-66%) Tunneling: No Undermining: No Wound Description Classification: Category/Stage III Wound Margin: Flat and Intact Exudate Amount: Small Exudate Type: Serosanguineous Exudate Color: red, brown Foul Odor After Cleansing: No Slough/Fibrino Yes Wound Bed Granulation Amount: Medium (34-66%) Exposed Structure Granulation Quality: Red Fascia Exposed: No Necrotic Amount: Medium (34-66%) Fat Layer (Subcutaneous Tissue) Exposed: Yes Necrotic Quality: Adherent Slough Tendon Exposed: No Muscle Exposed: No Joint Exposed: No Bone Exposed: No [name] Notes Wound #2 ([name] Calcaneus) 2. Periwound Care Barrier cream 3. Primary Dressing Applied Iodoflex 4. Secondary Dressing ABD Pad Dry Gauze Heel Cup 6. Support Layer Applied 4 layer compression wrap 7. Footwear/Offloading device applied Multipodus Splint/Boot Electronic Signature(s) Signed: 11/12/2019 4:18:11 PM By: Shawn Stalleaton, Bobbi Entered By: Shawn Stalleaton, Bobbi on 11/12/2019 13:23:01 -------------------------------------------------------------------------------- Wound Assessment Details Patient Name: Date of Service: Harry ChurchFA CUNDO, [name] RLO S 11/12/2019 1:15 PM Medical Record Number: [id number] Patient Account Number: [id number] Date of Birth/Sex: Treating RN: 30-Sep-1965 (54 y.o. Harry SoursM) Deaton, Bobbi Primary Care Tenea Sens: Harry PasseEdwards, Michelle Other Clinician: Referring Donnielle Addison: Treating  Maxden Naji/Extender: Harry Brashobson, Michael Edwards, Michelle Weeks in [name]: 6 Wound Status Wound Number: 3 Primary Etiology: Pressure Ulcer Wound Location: Left Metatarsal head fifth Wound Status: Open Wounding Event: Gradually Appeared Comorbid History: Hypotension, Type II Diabetes Date Acquired: 08/12/2019 Weeks Of [name]: 6 Clustered Wound: No Wound Measurements Length: (cm) 3.8 Width: (cm) 2.5 Depth: (cm) 1.1 Area: (cm) 7.461 Volume: (cm) 8.207 % Reduction in Area: -31.9% % Reduction in Volume: -1352.6% Epithelialization: Small (1-33%) Tunneling: No Undermining: No Wound Description Classification: Unstageable/Unclassified Wound Margin: Distinct, outline attached Exudate Amount: Medium Exudate Type: Serosanguineous Exudate Color: red, brown Foul Odor After Cleansing: No Slough/Fibrino Yes Wound Bed Granulation Amount: Medium (34-66%) Exposed Structure Granulation Quality: Red, Pink Fascia Exposed: No Necrotic Amount: Medium (34-66%) Fat Layer (Subcutaneous Tissue) Exposed: Yes Necrotic Quality: Adherent Slough Tendon Exposed: No Muscle Exposed: No Joint Exposed: No Bone Exposed: No [name] Notes Wound #3 (Left Metatarsal head fifth) 2. Periwound Care Barrier cream 3. Primary Dressing Applied Iodoflex 4. Secondary Dressing ABD Pad Dry Gauze Heel Cup 6. Support Layer Applied 4 layer compression wrap 7. Footwear/Offloading device applied Multipodus Splint/Boot Electronic Signature(s) Signed: 11/12/2019 4:18:11 PM By: Shawn Stalleaton, Bobbi Entered By: Shawn Stalleaton, Bobbi on 11/12/2019 13:23:45 -------------------------------------------------------------------------------- Wound Assessment Details Patient Name: Date of Service: Harry ChurchFA CUNDO, [name] RLO S 11/12/2019 1:15 PM Medical Record Number: [id number] Patient Account Number: [id number] Date of Birth/Sex: Treating RN: 30-Sep-1965 (54 y.o. Harry SoursM) Deaton, Bobbi Primary Care Janeli Lewison: Harry PasseEdwards, Michelle Other  Clinician: Referring Vermon Grays: Treating Napoleon Monacelli/Extender: Harry Brashobson, Michael Edwards, Michelle Weeks in [name]: 6 Wound Status Wound Number: 4 Primary Etiology: Pressure Ulcer Wound Location: Left, Lateral Lower Leg Wound Status: Open Wounding Event: Pressure Injury Comorbid History: Hypotension, Type II Diabetes Date Acquired: 08/12/2019 Weeks Of [name]: 6 Clustered Wound: No Wound Measurements Length: (cm) 1.4 Width: (cm) 0.7 Depth: (cm) 0.1 Area: (cm) 0.77 Volume: (cm) 0.077 % Reduction in Area: 73.9% % Reduction  in Volume: 73.9% Epithelialization: Medium (34-66%) Tunneling: No Undermining: No Wound Description Classification: Category/Stage III Wound Margin: Distinct, outline attached Exudate Amount: Medium Exudate Type: Serosanguineous Exudate Color: red, brown Foul Odor After Cleansing: No Slough/Fibrino Yes Wound Bed Granulation Amount: Medium (34-66%) Exposed Structure Granulation Quality: Red Fascia Exposed: No Necrotic Amount: Medium (34-66%) Fat Layer (Subcutaneous Tissue) Exposed: Yes Necrotic Quality: Adherent Slough Tendon Exposed: No Muscle Exposed: No Joint Exposed: No Bone Exposed: No [name] Notes Wound #4 (Left, Lateral Lower Leg) 2. Periwound Care Barrier cream 3. Primary Dressing Applied Iodoflex 4. Secondary Dressing ABD Pad Dry Gauze Heel Cup 6. Support Layer Applied 4 layer compression wrap 7. Footwear/Offloading device applied Multipodus Splint/Boot Electronic Signature(s) Signed: 11/12/2019 4:18:11 PM By: Shawn Stall Entered By: Shawn Stall on 11/12/2019 13:24:42 -------------------------------------------------------------------------------- Vitals Details Patient Name: Date of Service: [name] RLO S 11/12/2019 1:15 PM Medical Record Number: [id number] Patient Account Number: [id number] Date of Birth/Sex: Treating RN: [birth year] (54 y.o. [name] Primary Care Marki Frede: [name] Other  Clinician: Referring Kaydi Kley: Treating Deyvi Bonanno/Extender: [name]: 6 Vital Signs Time Taken: 13:00 Temperature (F): 97.7 Height (in): 68 Pulse (bpm): 62 Weight (lbs): 235 Respiratory Rate (breaths/min): 18 Body Mass Index (BMI): 35.7 Blood Pressure (mmHg): 106/70 Reference Range: 80 - 120 mg / dl Electronic Signature(s) Signed: 11/12/2019 4:18:11 PM By: Shawn Stall Entered By: Shawn Stall on 11/12/2019 13:21:37

## 2019-11-12 NOTE — Progress Notes (Addendum)
AUNDRA, ESPIN ([id number]) Visit Report for 11/12/2019 Debridement Details Patient Name: Date of Service: [name] 11/12/2019 1:15 PM Medical Record Number: [id number] Patient Account Number: [id number] Date of Birth/Sex: Treating RN: 1965/10/24 (54 y.o. Marvis Repress Primary Care Provider: Juluis Mire Other Clinician: Referring Provider: Treating Provider/Extender: Fleet Contras in Treatment: 6 Debridement Performed for Assessment: Wound #3 Left Metatarsal head fifth Performed By: Physician Ricard Dillon., MD Debridement Type: Debridement Level of Consciousness (Pre-procedure): Awake and Alert Pre-procedure Verification/Time Out Yes - 13:39 Taken: Start Time: 13:39 Pain Control: Lidocaine 5% topical ointment T Area Debrided (L x W): otal 3.8 (cm) x 2.5 (cm) = 9.5 (cm) Tissue and other material debrided: Viable, Non-Viable, Eschar, Muscle, Slough, Subcutaneous, Slough Level: Skin/Subcutaneous Tissue/Muscle Debridement Description: Excisional Instrument: Curette Bleeding: Moderate Hemostasis Achieved: Pressure End Time: 13:40 Procedural Pain: 0 Post Procedural Pain: 0 Response to Treatment: Procedure was tolerated well Level of Consciousness (Post- Awake and Alert procedure): Post Debridement Measurements of Total Wound Length: (cm) 3.8 Stage: Category/Stage IV Width: (cm) 2.5 Depth: (cm) 1.1 Volume: (cm) 8.207 Character of Wound/Ulcer Post Debridement: Improved Post Procedure Diagnosis Same as Pre-procedure Electronic Signature([name]: 11/12/2019 4:10:39 PM By: Kela Millin Signed: 11/12/2019 4:24:19 PM By: Linton Ham MD Entered By: Linton Ham on 11/12/2019 14:37:07 -------------------------------------------------------------------------------- HPI Details Patient Name: Date of Service: [name], [name] 11/12/2019 1:15 PM Medical Record Number: [id number] Patient Account Number: [id number] Date  of Birth/Sex: Treating RN: 11-19-1965 (53 y.o. Marvis Repress Primary Care Provider: Juluis Mire Other Clinician: Referring Provider: Treating Provider/Extender: Fleet Contras in Treatment: 6 History of Present Illness HPI Description: ADMISSION 10/02/2018 This is a 54 year old Spanish-speaking man who arrived accompanied by his wife. 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 Apparently 6 weeks ago his wife noted blisters on his bilateral fifth metatarsal heads and heels which she feels was from excessive pressure on the foot rests of his wheelchair. He was seen in the emergency room early in August and then was admitted to hospital from 8/10 through 8/14 with cellulitis of his feet. He was ultimately discharged on Keflex. Arterial studies were done in the hospital that showed an ABI of 1.61 on the right and 1.36 on the left but triphasic waveforms on the left and biphasic on the right. DVT rule out study was negative. X-ray of the bilateral feet did not show osteomyelitis. They have been dealing with these wounds at home. I think they are using Betadine. He has Medicaid and does not have home health. He comes in today with wounds on his bilateral plantar fifth metatarsal heads Achilles area of both heels and an area on the left lateral leg. His wife explains this as he always laterally rotates his legs when he is sitting in the wheelchair or in bed. She even seemed to believe that before his injury he actually walked on the outside of his feet. 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 Paraplegia T1-T6 8/27; the patient has 4 open wounds mirror-image areas over the plantar fifth metatarsal heads. Area on the right Achilles area and then on the left lateral calf. We have been using Iodoflex. He is on  Eliquis 9/2 debridement I did last week caused copious amounts of bleeding which was difficult to control. He is on Eliquis. He has  the 2 mirror-image wounds over the plantar fifth metatarsal heads in the area on the right Achilles and then an area on the left lateral calf. Both the fifth metatarsal head wounds on the left lateral calf of necrotic eschar on the surface. Black discoloration likely partially the silver nitrate we had to use last time 9/9; he arrives back in clinic today with the wounds on the plantar fifth metatarsal heads and exactly the same nonviable situation. He also has an area on the Achilles part of his right heel and the left lateral leg in a very similar situation. Nothing is making much progress I took some time to review his arterial studies from his hospitalization before he came to this clinic. On the right he had noncompressible ABIs with biphasic waveforms. They did not do a TBI in the right. On the left he had noncompressible ABIs at 1.36. However his TBI was 0.70 with triphasic waveforms at the dorsalis pedis he had a DVT rule out study that was negative. We have been using Iodoflex without much progress back in compression as he does not have access to wound care supplies 9/17; he comes back in with the same adherent eschar over both fifth met heads. I am really uncertain about this.. I used Iodoflex and last week switch to Sorbact. The area on the left posterior calf still with adherent debris. The right heel looked better 9/24 unfortunately his interpreter had to leave before we got in the room. Not much change. The right fifth met head is better however the left is not much improved we have been using Iodoflex 10/1; he has the mirror-image wounds on the firth metatarsal head. The area on the right looks better the area on the left still copious amounts of necrotic material which is probably subcutaneous and muscle. This is deep but I was able to get this down to a  healthy surface albeit with an extensive debridement. We have been using Iodoflex. The area on the right heel looks like it is contracting. The patient has significant chronic venous insufficiency and lymphedema. I have been keeping him meaning in compression for this reason and also this allows [place] to keep the dressings on all week. The patient does not have Musician) Signed: 11/22/2019 5:03:49 PM By: Linton Ham MD Previous Signature: 11/12/2019 4:24:19 PM Version By: Linton Ham MD Entered By: Linton Ham on 11/21/2019 08:35:18 -------------------------------------------------------------------------------- Physical Exam Details Patient Name: Date of Service: [name], [name] 11/12/2019 1:15 PM Medical Record Number: [id number] Patient Account Number: [id number] Date of Birth/Sex: Treating RN: May 18, 1965 (54 y.o. Marvis Repress Primary Care Provider: Juluis Mire Other Clinician: Referring Provider: Treating Provider/Extender: Fleet Contras in Treatment: 6 Constitutional Sitting or standing Blood Pressure is within target range for patient.. Pulse regular and within target range for patient.Marland Kitchen Respirations regular, non-labored and within target range.. Temperature is normal and within the target range for the patient.Marland Kitchen Appears in no distress. Notes Wound exam Fifth metatarsal head on the right looks better no debridement was necessary. On the left there is still copious amounts of necrotic material requiring aggressive debridement with a #5 curette. Hemostasis with a pressure dressing. The area on the heel on the right looks better and looks like it is contracting. Electronic Signature([name]: 11/12/2019 4:24:19 PM By: Linton Ham MD Entered By: Linton Ham on 11/12/2019 14:47:59 -------------------------------------------------------------------------------- Physician Orders Details Patient  Name: Date of Service: [name], [name] 11/12/2019 1:15 PM Medical  Record Number: [id number] Patient Account Number: [id number] Date of Birth/Sex: Treating RN: 1965/07/13 (54 y.o. Marvis Repress Primary Care Provider: Juluis Mire Other Clinician: Referring Provider: Treating Provider/Extender: Fleet Contras in Treatment: 6 Verbal / Phone Orders: No Diagnosis Coding ICD-10 Coding Code Description L97.518 Non-pressure chronic ulcer of other part of right foot with other specified severity L97.528 Non-pressure chronic ulcer of other part of left foot with other specified severity L89.613 Pressure ulcer of right heel, stage 3 L89.623 Pressure ulcer of left heel, stage 3 E11.621 Type 2 diabetes mellitus with foot ulcer L97.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 G82.21 Paraplegia, complete Follow-up Appointments Return Appointment in 1 week. Dressing Change Frequency Do not change entire dressing for one week. - all wounds Skin Barriers/Peri-Wound Care Moisturizing lotion - both legs. Wound Cleansing May shower with protection. - Dressing cannot get wet. Use cast protector if taking a shower Primary Wound Dressing Wound #1 Right Metatarsal head fifth Iodoflex Wound #2 Right Calcaneus Iodoflex Wound #3 Left Metatarsal head fifth Iodoflex Wound #4 Left,Lateral Lower Leg Iodoflex Secondary Dressing Wound #1 Right Metatarsal head fifth Dry Gauze Wound #2 Right Calcaneus Dry Gauze Heel Cup Wound #3 Left Metatarsal head fifth Dry Gauze Wound #4 Left,Lateral Lower Leg Dry Gauze ABD pad - felt both metatarsal heads to offer additional offloading Edema Control 4 layer compression - Bilateral Elevate legs to the level of the heart or above for 30 minutes daily and/or when sitting, a frequency of: - throughout the day Off-Loading Other: - avoid pressure to sites. Electronic Signature([name]: 11/12/2019  4:10:39 PM By: Kela Millin Signed: 11/12/2019 4:24:19 PM By: Linton Ham MD Entered By: Kela Millin on 11/12/2019 13:43:54 -------------------------------------------------------------------------------- Problem List Details Patient Name: Date of Service: [name], [name] 11/12/2019 1:15 PM Medical Record Number: [id number] Patient Account Number: [id number] Date of Birth/Sex: Treating RN: 06-29-1965 (54 y.o. Marvis Repress Primary Care Provider: Juluis Mire Other Clinician: Referring Provider: Treating Provider/Extender: Fleet Contras in Treatment: 6 Active Problems ICD-10 Encounter Code Description Active Date MDM Diagnosis L97.518 Non-pressure chronic ulcer of other part of right foot with other specified 10/01/2019 No Yes severity L97.528 Non-pressure chronic ulcer of other part of left foot with other specified 10/01/2019 No Yes severity L89.613 Pressure ulcer of right heel, stage 3 10/01/2019 No Yes L89.623 Pressure ulcer of left heel, stage 3 10/01/2019 No Yes E11.621 Type 2 diabetes mellitus with foot ulcer 10/01/2019 No Yes L97.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 10/01/2019 No Yes G82.21 Paraplegia, complete 10/01/2019 No Yes Inactive Problems Resolved Problems Electronic Signature([name]: 11/12/2019 4:24:19 PM By: Linton Ham MD Entered By: Linton Ham on 11/12/2019 14:36:40 -------------------------------------------------------------------------------- Progress Note Details Patient Name: Date of Service: [name], [name] 11/12/2019 1:15 PM Medical Record Number: [id number] Patient Account Number: [id number] Date of Birth/Sex: Treating RN: 1965/08/29 (54 y.o. Marvis Repress Primary Care Provider: Juluis Mire Other Clinician: Referring Provider: Treating Provider/Extender: Fleet Contras in Treatment: 6 Subjective History of  Present Illness (HPI) ADMISSION 10/02/2018 This is a 54 year old Spanish-speaking man who arrived accompanied by his wife. 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 Apparently 6 weeks ago his wife noted blisters on his bilateral fifth metatarsal heads and heels which she feels was from excessive pressure on the foot rests of his wheelchair. He was seen in the  emergency room early in August and then was admitted to hospital from 8/10 through 8/14 with cellulitis of his feet. He was ultimately discharged on Keflex. Arterial studies were done in the hospital that showed an ABI of 1.61 on the right and 1.36 on the left but triphasic waveforms on the left and biphasic on the right. DVT rule out study was negative. X-ray of the bilateral feet did not show osteomyelitis. They have been dealing with these wounds at home. I think they are using Betadine. He has Medicaid and does not have home health. He comes in today with wounds on his bilateral plantar fifth metatarsal heads Achilles area of both heels and an area on the left lateral leg. His wife explains this as he always laterally rotates his legs when he is sitting in the wheelchair or in bed. She even seemed to believe that before his injury he actually walked on the outside of his feet. 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 Paraplegia T1-T6 8/27; the patient has 4 open wounds mirror-image areas over the plantar fifth metatarsal heads. Area on the right Achilles area and then on the left lateral calf. We have been using Iodoflex. He is on Eliquis 9/2 debridement I did last week caused copious amounts of bleeding which was difficult to control. He is on Eliquis. He has the 2 mirror-image wounds over the plantar fifth metatarsal heads in the area on the right Achilles and then an area on the left  lateral calf. Both the fifth metatarsal head wounds on the left lateral calf of necrotic eschar on the surface. Black discoloration likely partially the silver nitrate we had to use last time 9/9; he arrives back in clinic today with the wounds on the plantar fifth metatarsal heads and exactly the same nonviable situation. He also has an area on the Achilles part of his right heel and the left lateral leg in a very similar situation. Nothing is making much progress I took some time to review his arterial studies from his hospitalization before he came to this clinic. On the right he had noncompressible ABIs with biphasic waveforms. They did not do a TBI in the right. On the left he had noncompressible ABIs at 1.36. However his TBI was 0.70 with triphasic waveforms at the dorsalis pedis he had a DVT rule out study that was negative. We have been using Iodoflex without much progress back in compression as he does not have access to wound care supplies 9/17; he comes back in with the same adherent eschar over both fifth met heads. I am really uncertain about this.. I used Iodoflex and last week switch to Sorbact. The area on the left posterior calf still with adherent debris. The right heel looked better 9/24 unfortunately his interpreter had to leave before we got in the room. Not much change. The right fifth met head is better however the left is not much improved we have been using Iodoflex 10/1; he has the mirror-image wounds on the first metatarsal head. The area on the right looks better the area on the left still copious amounts of necrotic material which is probably subcutaneous and muscle. This is deep but I was able to get this down to a healthy surface albeit with an extensive debridement. We have been using Iodoflex. The area on the right heel looks like it is contracting. The patient has significant chronic venous insufficiency and  lymphedema. I have been keeping him meaning in compression  for this reason and also this allows [place] to keep the dressings on all week. The patient does not have insurance Objective Constitutional Sitting or standing Blood Pressure is within target range for patient.. Pulse regular and within target range for patient.Marland Kitchen Respirations regular, non-labored and within target range.. Temperature is normal and within the target range for the patient.Marland Kitchen Appears in no distress. Vitals Time Taken: 1:00 PM, Height: 68 in, Weight: 235 lbs, BMI: 35.7, Temperature: 97.7 F, Pulse: 62 bpm, Respiratory Rate: 18 breaths/min, Blood Pressure: 106/70 mmHg. General Notes: Wound exam ooFifth metatarsal head on the right looks better no debridement was necessary. On the left there is still copious amounts of necrotic material requiring aggressive debridement with a #5 curette. Hemostasis with a pressure dressing. ooThe area on the heel on the right looks better and looks like it is contracting. Integumentary (Hair, Skin) Wound #1 status is Open. Original cause of wound was Gradually Appeared. The wound is located on the Right Metatarsal head fifth. The wound measures 3.1cm length x 1.8cm width x 0.8cm depth; 4.383cm^2 area and 3.506cm^3 volume. There is Fat Layer (Subcutaneous Tissue) exposed. There is no tunneling or undermining noted. There is a medium amount of serosanguineous drainage noted. The wound margin is distinct with the outline attached to the wound base. There is medium (34-66%) red, pink granulation within the wound bed. There is a medium (34-66%) amount of necrotic tissue within the wound bed including Adherent Slough. Wound #2 status is Open. Original cause of wound was Gradually Appeared. The wound is located on the Right Calcaneus. The wound measures 0.3cm length x 0.8cm width x 0.1cm depth; 0.188cm^2 area and 0.019cm^3 volume. There is Fat Layer (Subcutaneous Tissue) exposed. There is no tunneling or undermining noted. There is a small amount of  serosanguineous drainage noted. The wound margin is flat and intact. There is medium (34-66%) red granulation within the wound bed. There is a medium (34-66%) amount of necrotic tissue within the wound bed including Adherent Slough. Wound #3 status is Open. Original cause of wound was Gradually Appeared. The wound is located on the Left Metatarsal head fifth. The wound measures 3.8cm length x 2.5cm width x 1.1cm depth; 7.461cm^2 area and 8.207cm^3 volume. There is Fat Layer (Subcutaneous Tissue) exposed. There is no tunneling or undermining noted. There is a medium amount of serosanguineous drainage noted. The wound margin is distinct with the outline attached to the wound base. There is medium (34-66%) red, pink granulation within the wound bed. There is a medium (34-66%) amount of necrotic tissue within the wound bed including Adherent Slough. Wound #4 status is Open. Original cause of wound was Pressure Injury. The wound is located on the Left,Lateral Lower Leg. The wound measures 1.4cm length x 0.7cm width x 0.1cm depth; 0.77cm^2 area and 0.077cm^3 volume. There is Fat Layer (Subcutaneous Tissue) exposed. There is no tunneling or undermining noted. There is a medium amount of serosanguineous drainage noted. The wound margin is distinct with the outline attached to the wound base. There is medium (34-66%) red granulation within the wound bed. There is a medium (34-66%) amount of necrotic tissue within the wound bed including Adherent Slough. 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 There was a Excisional Skin/Subcutaneous Tissue/Muscle Debridement with a total area of 9.5 sq cm performed by Ricard Dillon., MD. With the following instrument([name]): Curette to remove Viable and Non-Viable tissue/material. Material removed includes Muscle, Eschar, Subcutaneous Tissue, and Slough after achieving pain control using Lidocaine 5% topical ointment. No specimens were taken. A time out was conducted at 13:39, prior to the start of the procedure. A Moderate amount of bleeding was controlled with Pressure. The procedure was tolerated well with a pain level of 0 throughout and a pain level of 0 following the procedure. Post Debridement Measurements: 3.8cm length x 2.5cm width x 1.1cm depth; 8.207cm^3 volume. Post debridement Stage noted as Category/Stage IV. Character of Wound/Ulcer Post Debridement is improved. Post procedure Diagnosis Wound #3: Same as Pre-Procedure Pre-procedure diagnosis of Wound #3 is a Pressure Ulcer located on the Left Metatarsal head fifth . There was a Four Layer Compression Therapy Procedure by Deon Pilling, RN. Post procedure Diagnosis Wound #3: Same as Pre-Procedure Wound #1 Pre-procedure diagnosis of Wound #1 is a Pressure Ulcer located on the Right Metatarsal head fifth . There was a Four Layer Compression Therapy Procedure by Deon Pilling, RN. Post procedure Diagnosis Wound #1: Same as Pre-Procedure Wound #2 Pre-procedure diagnosis of Wound #2 is a Pressure Ulcer located on the Right Calcaneus . There was a Four Layer Compression Therapy Procedure by Deon Pilling, RN. Post procedure Diagnosis Wound #2: Same as Pre-Procedure Wound #4 Pre-procedure diagnosis of Wound #4 is a Pressure Ulcer located on the Left,Lateral Lower Leg . There was a Four Layer Compression Therapy Procedure by Deon Pilling, RN. Post procedure Diagnosis Wound #4: Same as Pre-Procedure Plan Follow-up  Appointments: Return Appointment in 1 week. Dressing Change Frequency: Do not change entire dressing for one week. - all wounds Skin Barriers/Peri-Wound Care: Moisturizing lotion - both legs. Wound Cleansing: May shower with protection. - Dressing cannot get wet. Use cast protector if taking a shower Primary Wound Dressing: Wound #1 Right Metatarsal head fifth: Iodoflex Wound #2 Right Calcaneus: Iodoflex Wound #3 Left Metatarsal head fifth: Iodoflex Wound #4 Left,Lateral Lower Leg: Iodoflex Secondary Dressing: Wound #1 Right Metatarsal head fifth: Dry Gauze Wound #2 Right Calcaneus: Dry Gauze Heel Cup Wound #3 Left Metatarsal head fifth: Dry Gauze Wound #4 Left,Lateral Lower Leg: Dry Gauze ABD pad - felt both metatarsal heads to offer additional offloading Edema Control: 4 layer compression - Bilateral Elevate legs to the level of the heart or above for 30 minutes daily and/or when sitting, a frequency of: - throughout the day Off-Loading: Other: - avoid pressure to sites. #1 I am continue with the Iodoflex on all 3 wound areas. The area that still needs work is a left fifth metatarsal head. 2. I am fairly certain these were at one time pressure ulcers. They are mirror imaging wounds on the feet although I went over this with the patient and his wife through the interpreter the do not admit easily to any pressure on this area. He does go outside with his legs dependent I asked him not to do this more than more than a couple of hours as we could be having problems with uncontrolled edema. 3. At this point I still see no evidence of infection and no reason for x-rays or cultures. I  will continue to be vigilant about this. 4. If we can get the surface of the plantar wounds to look satisfactory I will change to silver collagen perhaps endoform still under compression. He is not a candidate for advanced treatment option Electronic Signature([name]: 11/12/2019 4:24:19 PM By:  Linton Ham MD Entered By: Linton Ham on 11/12/2019 14:50:08 -------------------------------------------------------------------------------- SuperBill Details Patient Name: Date of Service: [name], Beatrice 11/12/2019 Medical Record Number: [id number] Patient Account Number: [id number] Date of Birth/Sex: Treating RN: [birth year] (54 y.o. Marvis Repress Primary Care Provider: Juluis Mire Other Clinician: Referring Provider: Treating Provider/Extender: Fleet Contras in Treatment: 6 Diagnosis Coding ICD-10 Codes Code Description [id number] Non-pressure chronic ulcer of other part of right foot with other specified severity L97.528 Non-pressure chronic ulcer of other part of left foot with other specified severity L89.613 Pressure ulcer of right heel, stage 3 L89.623 Pressure ulcer of left heel, stage 3 E11.621 Type 2 diabetes mellitus with foot ulcer L97.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 G82.21 Paraplegia, complete Facility Procedures CPT4 Code: [id number] Description: 64158 - DEB MUSC/FASCIA 20 SQ CM/< ICD-10 Diagnosis Description L97.528 Non-pressure 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

## 2019-11-16 ENCOUNTER — Other Ambulatory Visit: Payer: Self-pay

## 2019-11-16 ENCOUNTER — Ambulatory Visit: Payer: Medicaid Other | Attending: Physical Medicine and Rehabilitation

## 2019-11-16 DIAGNOSIS — R29818 Other symptoms and signs involving the nervous system: Secondary | ICD-10-CM | POA: Insufficient documentation

## 2019-11-16 DIAGNOSIS — M25512 Pain in left shoulder: Secondary | ICD-10-CM | POA: Diagnosis present

## 2019-11-16 DIAGNOSIS — G8929 Other chronic pain: Secondary | ICD-10-CM | POA: Diagnosis present

## 2019-11-16 DIAGNOSIS — M25511 Pain in right shoulder: Secondary | ICD-10-CM | POA: Diagnosis present

## 2019-11-16 DIAGNOSIS — R278 Other lack of coordination: Secondary | ICD-10-CM | POA: Insufficient documentation

## 2019-11-16 DIAGNOSIS — M79642 Pain in left hand: Secondary | ICD-10-CM | POA: Insufficient documentation

## 2019-11-16 DIAGNOSIS — R293 Abnormal posture: Secondary | ICD-10-CM | POA: Diagnosis present

## 2019-11-16 DIAGNOSIS — M25631 Stiffness of right wrist, not elsewhere classified: Secondary | ICD-10-CM | POA: Insufficient documentation

## 2019-11-16 DIAGNOSIS — G8253 Quadriplegia, C5-C7 complete: Secondary | ICD-10-CM | POA: Diagnosis present

## 2019-11-16 DIAGNOSIS — M6281 Muscle weakness (generalized): Secondary | ICD-10-CM

## 2019-11-16 DIAGNOSIS — R208 Other disturbances of skin sensation: Secondary | ICD-10-CM | POA: Diagnosis present

## 2019-11-16 DIAGNOSIS — M25611 Stiffness of right shoulder, not elsewhere classified: Secondary | ICD-10-CM | POA: Diagnosis present

## 2019-11-16 DIAGNOSIS — M79641 Pain in right hand: Secondary | ICD-10-CM | POA: Diagnosis present

## 2019-11-16 DIAGNOSIS — M25612 Stiffness of left shoulder, not elsewhere classified: Secondary | ICD-10-CM | POA: Diagnosis present

## 2019-11-16 NOTE — Therapy (Signed)
[facility] [address] [place], [place], [address] Phone: [phone]   Fax:  [phone]  Physical Therapy Evaluation  Patient Details  Name: [name] MRN: [id number] Date of Birth: 06-Jan-1966 Referring Provider (PT): Dr. Berline ChoughLovorn   Encounter Date: 11/16/2019   PT End of Session - 11/16/19 1106    Visit Number 1    Number of Visits 9    Authorization Type medicaid    PT Start Time 1102    PT Stop Time 1154    PT Time Calculation (min) 52 min    Equipment Utilized During Treatment Gait belt   slideboard   Activity Tolerance Patient tolerated treatment well    Behavior During Therapy WFL for tasks assessed/performed           Past Medical History:  Diagnosis Date   Diabetes mellitus without complication (HCC)    History of cervical fracture     Past Surgical History:  Procedure Laterality Date   CERVICAL SPINE SURGERY      There were no vitals filed for this visit.    Subjective Assessment - 11/16/19 1107    Subjective Pt was referred by Dr. Berline ChoughLovorn. 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 DM2 Was in the hospital 8/10-8/14 for wounds on his feet.  Pt reports that everything is still the same. He is using the [employer] lift. Pt is going to wound center every 8 days to care for wounds on bilateral heels. Reports that left is worse than right. Pt denies any pain in feet as can not feel them. He does try to elevate his legs. Pt had recent injections by Dr. Berline ChoughLovorn in shoulder area. Pt has not been using slideboard at home. Feels he is not moving as well.    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 DM2    Patient Stated Goals Pt wants to be able to do exercises so he can move more and not feel so tight.    Currently in Pain? No/denies              [facility] PT Assessment - 11/16/19 1115      Assessment   Medical Diagnosis C7 quadriplegia    Referring Provider (PT) Dr. Berline ChoughLovorn    Onset Date/Surgical Date 10/13/19   referral date. Initial injury 07/22/18   Hand Dominance Right    Prior Therapy outpatient PT earlier this year      Home Environment   Living Environment Private residence    Living Arrangements Spouse/significant other;Children    Available Help at Discharge Family    Type of Home House    Home Access Ramped entrance    Home Layout One level    Home Equipment Wheelchair - power;Hospital bed   [facility]   Additional Comments Wife assists and also has aide 2x/week to assist with bathing, meals for 4 hours/day.       Prior Function   Level of Independence --   Pt has been dependent with self care since injury in 2020   Vocation On disability      Observation/Other Assessments   Skin Integrity  pressure sore to bilateral heels and unable to observe as has unna boot wraps bilateral      Observation/Other Assessments-Edema    Edema --   BLE     Sensation   Light Touch Impaired by gross assessment    Additional Comments no light touch in BLE      ROM / Strength   AROM / PROM / Strength Strength;AROM;PROM      AROM   Overall AROM Comments Pt has  bilateral shoulder flexion about 110 degrees. Shoulder extension about 30 degrees.      PROM   Overall PROM Comments PT noted decreased hip IR/ER bilateral, Hamstring right about 80 degrees and about 70 degrees on left.      Strength   Overall Strength Comments 0/5 BLE    Strength Assessment Site Shoulder;Elbow;Wrist;Hand    Right/Left Shoulder Right;Left    Right Shoulder Flexion 4/5   within available range   Right Shoulder Extension 4/5    Left Shoulder  Flexion 4/5   within available range   Left Shoulder Extension 4/5    Right/Left Elbow Right;Left    Right Elbow Flexion 4/5    Right Elbow Extension 4/5    Left Elbow Flexion 4/5    Left Elbow Extension 4/5    Right/Left Wrist Right;Left    Right Wrist Flexion 3+/5    Right Wrist Extension 3+/5    Left Wrist Flexion 3+/5    Left Wrist Extension 3+/5    Right/Left hand Right;Left    Right Hand Gross Grasp Functional    Left Hand Gross Grasp --   decreased compared to left but able to open and close hand     Bed Mobility   Bed Mobility Rolling Right;Rolling Left;Right Sidelying to Sit;Sit to Supine    Rolling Right Maximal Assistance - Patient 25-49%    Rolling Left Maximal Assistance - Patient 25-49%    Right Sidelying to Sit 2 Helpers;Total Assistance - Patient < 25%    Sit to Supine 2 Helpers;Total Assistance - Patient < 25%      Transfers   Transfers Lateral/Scoot Transfers    Lateral/Scoot Transfers 2: Max assist;With slide board;With armrests removed;From elevated surface   2nd person assist as well     High Level Balance   High Level Balance Comments Pt sat edge of mat without UE support x 1 min and then with small pertubations x 30 sec.                      Objective measurements completed on examination: See above findings.               PT Education - 11/16/19 2042    Education Details PT plan of care    Person(s) Educated Spouse;Patient    Methods Explanation    Comprehension Verbalized understanding            PT Short Term Goals - 11/16/19 2050      PT SHORT TERM GOAL #1   Title Pt and wife will be instructed in HEP for BLE stretching to ensure they are performing correctly. (due by 3rd visit)    Baseline Had been issued HEP for stretching earlier this year but have not been as consistent in performance and need to review.    Time 3    Period Weeks    Status New    Target Date 12/15/19      PT SHORT TERM GOAL #2  Title Pt  will be able to perform slideboard transfer max assist of 1 for improved mobility    Baseline max assist +2    Time 3    Status New    Target Date 12/15/19      PT SHORT TERM GOAL #3   Title Pt will be instructed in ways to better manage swelling in legs and pressure relief to prevent further injury.    Baseline pt currently has bilateral pressure sores on heels    Time 3    Period Weeks    Status New    Target Date 12/15/19      PT SHORT TERM GOAL #4   Title Pt will be able to maintain long sit supervision x 2 min to assist with ADLs.    Baseline unable to assess at eval but limited HS length will challenge long sit.    Time 3    Period Weeks    Status New    Target Date 12/15/19             PT Long Term Goals - 11/16/19 2059      PT LONG TERM GOAL #1   Title Pt will be able to perform slideboard transfer min assist once board is placed for improved mobility.    Baseline max assist +2    Time 8    Period Weeks    Status New    Target Date 01/10/20      PT LONG TERM GOAL #2   Title Pt will transition from long sit to short sit min assist for improved mobility.    Baseline currently hoyer lift transfer at home.    Time 8    Period Weeks    Status New    Target Date 01/10/20      PT LONG TERM GOAL #3   Title Patient will perform long sit/circle without UE support >/= 5 minutes with min guard for improved ability to assist with ADLs in bed.    Baseline unable to assess at eval but has limited hamstring length that will affect long sitting balance.    Time 8    Period Weeks    Status New    Target Date 01/10/20      PT LONG TERM GOAL #4   Title Pt will perform rolling in bed mod assist for improved mobility.    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 Pt was BUE  strength grossly 4/5 within available limits. Even though he is C7 level pt was noted to have finger flexion/extension bilateral with right stronger than left. He has 0/5 strength in BLE. Decreased HS length bilateral with right around 80 degrees and left around 70 degrees. Pt is requiring max assist +2 with slideboard transfer and total assist/max assist with bed mobility. Pt has bilateral pressure sore on heels that he is following at wound center for. Pt will benefit from skilled PT to address strength, balance and functional mobility.    Personal Factors and Comorbidities Comorbidity 2    Comorbidities history of DVTs, DM2    Examination-Activity Limitations Bathing;Bed Mobility;Transfers;Dressing    Examination-Participation Restrictions Community Activity  Stability/Clinical Decision Making Evolving/Moderate complexity    Clinical Decision Making Moderate    Rehab Potential Good    PT Frequency 1x / week   plus eval   PT Duration 8 weeks    PT Treatment/Interventions ADLs/Self Care Home Management;Cryotherapy;DME Instruction;Moist Heat;Functional mobility training;Therapeutic activities;Therapeutic exercise;Balance training;Patient/family education;Neuromuscular re-education;Manual techniques;Wheelchair mobility training;Passive range of motion    PT Next Visit Plan Pt wants to weigh. Slideboard transfers, siting balance in short sit and long sit, bed mobility. Reissue HEP for stretching BLE.    Consulted and Agree with Plan of Care Patient;Family member/caregiver    Family Member Consulted wife           Patient will benefit from skilled therapeutic intervention in order to improve the following deficits and impairments:  Decreased balance, Decreased mobility, Decreased knowledge of use of DME, Decreased activity tolerance, Decreased range of motion, Impaired flexibility, Decreased strength, Increased edema, Impaired sensation, Impaired tone, Impaired UE functional use  Visit  Diagnosis: Muscle weakness (generalized)  Abnormal posture  Quadriplegia, C5-C7 complete (HCC)     Problem List Patient Active Problem List   Diagnosis Date Noted   Pressure ulcer 09/22/2019   Cellulitis 09/22/2019   Hypokalemia 09/22/2019   Spasticity 03/08/2019   Erectile dysfunction due to diseases classified elsewhere 03/08/2019   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/22/2019   Neurogenic bowel 01/22/2019   Neurogenic bladder 01/22/2019   Myofascial pain 01/22/2019   Spinal cord injury at T1-T6 level (HCC) 12/30/2018   Quadriplegia (HCC) 12/30/2018    Ronn Melena, PT, DPT, NCS 11/16/2019, 9:07 PM  [facility] [address] [place], [place], [address] Phone: [phone]   Fax:  [phone]  Name: Johnavon Mcclafferty MRN: [id number] Date of Birth: 1966/01/06

## 2019-11-18 ENCOUNTER — Other Ambulatory Visit: Payer: Self-pay | Admitting: Physical Medicine and Rehabilitation

## 2019-11-19 ENCOUNTER — Other Ambulatory Visit: Payer: Self-pay

## 2019-11-19 ENCOUNTER — Encounter (HOSPITAL_BASED_OUTPATIENT_CLINIC_OR_DEPARTMENT_OTHER): Payer: Medicaid Other | Admitting: Internal Medicine

## 2019-11-19 ENCOUNTER — Ambulatory Visit: Payer: Medicaid Other | Admitting: Occupational Therapy

## 2019-11-19 DIAGNOSIS — M25612 Stiffness of left shoulder, not elsewhere classified: Secondary | ICD-10-CM

## 2019-11-19 DIAGNOSIS — M6281 Muscle weakness (generalized): Secondary | ICD-10-CM | POA: Diagnosis not present

## 2019-11-19 DIAGNOSIS — M79642 Pain in left hand: Secondary | ICD-10-CM

## 2019-11-19 DIAGNOSIS — E11621 Type 2 diabetes mellitus with foot ulcer: Secondary | ICD-10-CM | POA: Diagnosis not present

## 2019-11-19 DIAGNOSIS — R208 Other disturbances of skin sensation: Secondary | ICD-10-CM

## 2019-11-19 DIAGNOSIS — M25611 Stiffness of right shoulder, not elsewhere classified: Secondary | ICD-10-CM

## 2019-11-19 DIAGNOSIS — R278 Other lack of coordination: Secondary | ICD-10-CM

## 2019-11-19 DIAGNOSIS — M25631 Stiffness of right wrist, not elsewhere classified: Secondary | ICD-10-CM

## 2019-11-19 DIAGNOSIS — G8929 Other chronic pain: Secondary | ICD-10-CM

## 2019-11-19 DIAGNOSIS — M79641 Pain in right hand: Secondary | ICD-10-CM

## 2019-11-19 DIAGNOSIS — R293 Abnormal posture: Secondary | ICD-10-CM

## 2019-11-19 NOTE — Therapy (Addendum)
[facility] [address] [place], [place], [address] Phone: [phone]   Fax:  [phone]  Occupational Therapy Evaluation  Patient Details  Name: [name] MRN: [id number] Date of Birth: 1965/07/01 Referring Provider (OT): Dr. Berline Chough   Encounter Date: 11/19/2019   OT End of Session - 11/19/19 1114    Visit Number 1    Number of Visits 13    Date for OT Re-Evaluation 03/09/19    Authorization Type Medicaid    Authorization - Visit Number 0    Authorization - Number of Visits 12    OT Start Time 1106    OT Stop Time 1145    OT Time Calculation (min) 39 min    Activity Tolerance Patient tolerated treatment well    Behavior During Therapy WFL for tasks assessed/performed           Past Medical History:  Diagnosis Date   Diabetes mellitus without complication (HCC)    History of cervical fracture     Past Surgical History:  Procedure Laterality Date   CERVICAL SPINE SURGERY      There were no vitals filed for this visit.   Subjective Assessment - 11/19/19 1111    Currently in Pain? Yes    Pain Score 3    3-6   Pain Location Hand    Pain Orientation Right;Left    Pain Descriptors / Indicators Aching    Pain Type Acute pain    Pain Onset More than a month ago    Pain Frequency Intermittent    Aggravating Factors  unknown    Pain Relieving Factors moving hands    Multiple Pain Sites Yes    Pain Score 3    Pain Location Shoulder    Pain Orientation Left    Pain Descriptors / Indicators Aching    Pain Type Acute pain    Pain Onset More than a month ago    Pain Frequency Intermittent    Aggravating Factors  malpositioning    Pain Relieving Factors repositioning             [facility] OT Assessment - 11/19/19 1115      Assessment   Medical Diagnosis C7 quadriplegia    Referring Provider (OT) Dr. Berline Chough    Onset Date/Surgical Date 10/13/19    Hand Dominance Right    Prior Therapy PT/OT       Balance Screen   Has the patient fallen in the past 6 months No    Has the patient had a decrease in activity level because of a fear of falling?  No    Is the patient reluctant to leave their home because of a fear of falling?  No      Home  Environment   Family/patient expects to be discharged to: Private residence    Home Access Ramped entrance    Home Layout One level    Lives With Spouse;Family      Prior Function   Level of Independence Needs assistance with ADLs;Needs assistance with transfers    Vocation On disability      ADL   Eating/Feeding Set up    Grooming Set up    Upper Body Bathing Moderate assistance    Lower Body Bathing + 1 Total assistance    Upper Body Dressing Moderate assistance    Lower Body Dressing +1 Total aassistance    Toilet Transfer --   does not use   Tub/Shower Transfer --  has rolling shower chair uses      Cognition   Overall Cognitive Status Within Functional Limits for tasks assessed      Observation/Other Assessments   Skin Integrity pressure sore to bilateral heels and unable to observe as has unna boot wraps bilateral      Sensation   Light Touch Impaired by gross assessment   ulnar side of bilateral hands     Coordination   9 Hole Peg Test Right;Left    Right 9 Hole Peg Test 34.22    Left 9 Hole Peg Test 1 min 31 secs    Coordination LUE impaired      ROM / Strength   AROM / PROM / Strength AROM      AROM   AROM Assessment Site Shoulder;Elbow;Forearm;Wrist;Finger    Right/Left Shoulder Right;Left    Right Shoulder Flexion 125 Degrees    Right Shoulder ABduction 95 Degrees    Right Shoulder External Rotation --   impaired, not formally assessed   Left Shoulder Flexion 130 Degrees    Left Shoulder ABduction 100 Degrees    Left Shoulder External Rotation --   impaired not formally assessed    Right/Left Elbow Right;Left    Right Elbow Flexion --   WFLS   Right Elbow Extension -15    Left Elbow Flexion --   WFLS    Left Elbow Extension -10    Right/Left Forearm Right;Left    Right Forearm Supination --   75%   Left Forearm Supination --   WFLS   Right/Left Finger Left    Right Composite Finger Flexion --   wfl   Left Composite Finger Flexion --   grossly 80%     Strength   Right Shoulder Flexion 4/5    Left Shoulder Flexion 4/5    Right Elbow Flexion 4/5    Right Elbow Extension 4/5    Left Elbow Extension 4/5    Right Wrist Flexion 3+/5    Right Wrist Extension 3+/5    Left Wrist Flexion 3+/5    Left Wrist Extension 3+/5    Right Hand Gross Grasp Functional    Left Hand Gross Grasp Impaired   80%     Hand Function   Right Hand Grip (lbs) 30.4    Left Hand Grip (lbs) 12.1                             OT Short Term Goals - 11/19/19 1259      OT SHORT TERM GOAL #1   Title I with inital HEP-    Baseline dependent    Time 6    Period Weeks    Status New    Target Date 12/31/19      OT SHORT TERM GOAL #2   Title Pt will donn shirt with min A    Baseline mod A    Time 6    Period Weeks    Status On-going      OT SHORT TERM GOAL #3   Title Pt will report bilateral UE pain less than or equal to 4/10 during ADLS/ functional use.    Baseline bilateral UE pain 3-6/10    Time 6    Period Weeks    Status New      OT SHORT TERM GOAL #4   Title Pt will increase LUE grip strength to 17 lbs or greater for increased functional use.  Baseline RUE 30.4 lbs ,LUE 12.1 lbs    Time 6    Period Weeks    Status On-going      OT SHORT TERM GOAL #5   Title Pt will demonstrate improved LUE fine motor coordination for ADLs as evidenced by decreasing 9 hole peg test score to 80 secs or less.    Baseline RUE 34.22, LUE 1 min 31 secs    Time 6    Period Weeks    Status NEW     OT SHORT TERM GOAL #6   Title Pt will perform sliding board transfers with max A  for increased I with ADLs/ toilet transfers prn    Baseline Total  A +2 per PT report    Time 6    Period Weeks     Status New          --     --     --     --     --             OT Long Term Goals - 11/19/19 1302      OT LONG TERM GOAL #1   Title Pt will donn shirt with setup    Baseline mod A    Time 12    Period Weeks    Status New      OT LONG TERM GOAL #2   Title Pt will demonstrate improved bilateral UE external rotation to [facility]  as evidenced by pt's  increased ease with supporting himself in long and circle sitting, with pain less than or equal to 3/10.    Baseline limited bilateral external rotation in shoulders, pain 3-6/10 in UE's    Time 12    Period Weeks    Status On-going      OT LONG TERM GOAL #3   Title Pt will demonstrate improved LUE fine motor coordination as evidenced  by decreasing 9 hole peg test score to 70 secs or less.    Baseline RUE 34.22, LUE 1 min 31 secs    Time 12    Period Weeks    Status New      OT LONG TERM GOAL #4   Title Pt/ family will verbalize understanding of adapted strategies to maximize pt's I with ADLs/ IADLs.    Baseline needs updates/ reinforcement of strategies as pt is not performing consistently.    Time 12    Period Weeks    Status New      OT LONG TERM GOAL #5   Title Pt will increase RUE grip to 36 lbs and LUE grip to 20 lbs or greater in prep for bilateral functional use.    Baseline RUE 30.4, LUE 12.1    Time 12    Period Weeks    Status New                 Plan - 11/19/19 1252    Clinical Impression Statement Pt was referred by Dr. Berline ChoughLovorn. 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 DM2 Was in the hospital 8/10-8/14 for wounds on his feet.  Pt presents with the following deficits: decreased strength, decreased coordination, decreased functional mobility, decreased UE functional use, decreased ROM and decreased sensation which impedes performance of ADLS/IADLS. Pt  can benefit from skilled occupational therapy to address these  deficits in order to maximize pt's safety and I with ADLS/IADLs. 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 DM    OT Occupational Profile and History Detailed Assessment- Review of Records and additional review of physical, cognitive, psychosocial history related to current functional performance    Occupational performance deficits (Please refer to evaluation for details): ADL's;IADL's;Work;Leisure;Social Participation;Rest and Sleep;Play    Body Structure / Function / Physical Skills ADL;UE functional use;Endurance;Balance;Flexibility;Pain;FMC;ROM;GMC;Coordination;Sensation;Decreased knowledge of precautions;Decreased knowledge of use of DME;IADL;Strength;Dexterity;Mobility;Tone    Rehab Potential Good    Clinical Decision Making Limited treatment options, no task modification necessary    Comorbidities Affecting Occupational Performance: May have comorbidities impacting occupational performance    Modification or Assistance to Complete Evaluation  No modification of tasks or assist necessary to complete eval    OT Frequency 1x / week   plus eval   OT Duration Other (comment)    OT Treatment/Interventions Self-care/ADL training;Ultrasound;Energy conservation;Aquatic Therapy;DME and/or AE instruction;Patient/family education;Paraffin;Gait Training;Passive range of motion;Balance training;Fluidtherapy;Cryotherapy;Splinting;Building services engineer;Contrast Bath;Electrical Stimulation;Moist Heat;Therapeutic exercise;Manual Therapy;Therapeutic activities;Cognitive remediation/compensation;Neuromuscular education    Plan UE ROM and stretching in supine, ADL strategies    Consulted and Agree with Plan of Care Patient;Family member/caregiver    Family Member Consulted wife,interpreter            Patient will benefit from skilled therapeutic intervention in order to improve the following deficits and impairments:   Body Structure / Function / Physical Skills: ADL, UE functional use, Endurance, Balance, Flexibility, Pain, FMC, ROM, GMC, Coordination, Sensation, Decreased knowledge of precautions, Decreased knowledge of use of DME, IADL, Strength, Dexterity, Mobility, Tone       Visit Diagnosis: Muscle weakness (generalized) - Plan: Ot plan of care cert/re-cert  Other disturbances of skin sensation - Plan: Ot plan of care cert/re-cert  Stiffness of right shoulder, not elsewhere classified - Plan: Ot plan of care cert/re-cert  Stiffness of left shoulder, not elsewhere classified - Plan: Ot plan of care cert/re-cert  Stiffness of right wrist, not elsewhere classified - Plan: Ot plan of care cert/re-cert  Abnormal posture - Plan: Ot plan of care cert/re-cert  Other lack of coordination - Plan: Ot plan of care cert/re-cert  Chronic left shoulder pain - Plan: Ot plan of care cert/re-cert  Chronic right shoulder pain - Plan: Ot plan of care cert/re-cert  Pain in right hand - Plan: Ot plan of care cert/re-cert  Pain in left hand - Plan: Ot plan of care cert/re-cert    Problem List Patient Active Problem List   Diagnosis Date Noted   Pressure ulcer 09/22/2019   Cellulitis 09/22/2019   Hypokalemia 09/22/2019   Spasticity 03/08/2019   Erectile dysfunction due to diseases classified elsewhere 03/08/2019   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/22/2019   Neurogenic bowel 01/22/2019   Neurogenic bladder 01/22/2019   Myofascial pain 01/22/2019   Spinal cord injury at T1-T6 level (HCC) 12/30/2018   Quadriplegia (HCC) 12/30/2018    Stephaie Dardis 11/19/2019, 4:21 PM Keene Breath, OTR/L [phone] Phone: [phone] 4:21 PM 10/08/21[facility] [address] [place], [place], [address] Phone:  [phone]   Fax:  [phone]  Name: Diontay Rosencrans MRN: [id number] Date of Birth: 08/17/1965

## 2019-11-19 NOTE — Addendum Note (Signed)
Addended by: Keene Breath B on: 11/19/2019 04:21 PM   Modules accepted: Orders

## 2019-11-22 NOTE — Progress Notes (Addendum)
[name], [name] ([id number]) Visit Report for 11/19/2019 Arrival Information Details Patient Name: Date of Service: [name] 11/19/2019 2:45 PM Medical Record Number: [id number] Patient Account Number: [id number] Date of Birth/Sex: Treating RN: August 29, 1965 (54 y.o. [name] Primary Care Belford Pascucci: [name] Other Clinician: Referring Malikah Lakey: Treating Kyden Potash/Extender: [name] in Treatment: 7 Visit Information History Since Last Visit Added or deleted any medications: No Patient Arrived: Wheel Chair Any new allergies or adverse reactions: No Arrival Time: 15:27 Had a fall or experienced change in No Accompanied By: spouse, interpreter activities of daily living that may affect Transfer Assistance: None risk of falls: Patient Requires Transmission-Based Precautions: No Signs or symptoms of abuse/neglect since last visito No Patient Has Alerts: Yes Hospitalized since last visit: No Patient Alerts: Translator Required Implantable device outside of the clinic excluding No right ABI 1.61 cellular tissue based products placed in the center Left ABI 1.36 since last visit: Has Dressing in Place as Prescribed: Yes Has Compression in Place as Prescribed: Yes Pain Present Now: No Notes pt stays in chair Electronic Signature(s) Signed: 11/19/2019 5:42:53 PM By: Zenaida Deed RN, BSN Entered By: Zenaida Deed on 11/19/2019 15:31:55 -------------------------------------------------------------------------------- Compression Therapy Details Patient Name: Date of Service: [name], [name] 11/19/2019 2:45 PM Medical Record Number: [id number] Patient Account Number: [id number] Date of Birth/Sex: Treating RN: 24-Dec-1965 (54 y.o. [name] Primary Care Verlie Liotta: [name] Other Clinician: Referring Devantae Babe: Treating Nicholaus Steinke/Extender: [name] in Treatment: 7 Compression  Therapy Performed for Wound Assessment: Wound #1 Right Metatarsal head fifth Performed By: Clinician Shawn Stall, RN Compression Type: Four Layer Post Procedure Diagnosis Same as Pre-procedure Electronic Signature(s) Signed: 11/19/2019 5:35:06 PM By: Cherylin Mylar Entered By: Cherylin Mylar on 11/19/2019 16:25:10 -------------------------------------------------------------------------------- Compression Therapy Details Patient Name: Date of Service: [name] 11/19/2019 2:45 PM Medical Record Number: [id number] Patient Account Number: [id number] Date of Birth/Sex: Treating RN: 09-09-1965 (54 y.o. [name] Primary Care Shanai Lartigue: [name] Other Clinician: Referring Laytoya Ion: Treating Takota Cahalan/Extender: [name] in Treatment: 7 Compression Therapy Performed for Wound Assessment: Wound #2 Right Calcaneus Performed By: Clinician Shawn Stall, RN Compression Type: Four Layer Post Procedure Diagnosis Same as Pre-procedure Electronic Signature(s) Signed: 11/19/2019 5:35:06 PM By: Cherylin Mylar Entered By: Cherylin Mylar on 11/19/2019 16:25:10 -------------------------------------------------------------------------------- Compression Therapy Details Patient Name: Date of Service: [name] 11/19/2019 2:45 PM Medical Record Number: [id number] Patient Account Number: [id number] Date of Birth/Sex: Treating RN: 1965-10-25 (54 y.o. [name] Primary Care Clotee Schlicker: [name] Other Clinician: Referring Azaan Leask: Treating Devaney Segers/Extender: [name] in Treatment: 7 Compression Therapy Performed for Wound Assessment: Wound #3 Left Metatarsal head fifth Performed By: Clinician Shawn Stall, RN Compression Type: Four Layer Post Procedure Diagnosis Same as Pre-procedure Electronic Signature(s) Signed: 11/19/2019 5:35:06 PM By: Cherylin Mylar Entered  By: Cherylin Mylar on 11/19/2019 16:25:10 -------------------------------------------------------------------------------- Compression Therapy Details Patient Name: Date of Service: [name] 11/19/2019 2:45 PM Medical Record Number: [id number] Patient Account Number: [id number] Date of Birth/Sex: Treating RN: 1965-02-19 (54 y.o. [name] Primary Care Radhika Dershem: [name] Other Clinician: Referring Caydance Kuehnle: Treating Keimani Laufer/Extender: [name] in Treatment: 7 Compression Therapy Performed for Wound Assessment: Wound #4 Left,Lateral Lower Leg Performed By: Clinician Shawn Stall, RN Compression Type: Four Layer Post Procedure Diagnosis Same as Pre-procedure Electronic Signature(s) Signed: 11/19/2019 5:35:06 PM By: Cherylin Mylar Entered By: Cherylin Mylar on 11/19/2019 16:25:10 --------------------------------------------------------------------------------  Encounter Discharge Information Details Patient Name: Date of Service: [name] 11/19/2019 2:45 PM Medical Record Number: [id number] Patient Account Number: [id number] Date of Birth/Sex: Treating RN: 08/10/1965 (54 y.o. [name] Primary Care Shiryl Ruddy: [name] Other Clinician: Referring Avira Tillison: Treating Aaleah Hirsch/Extender: [name] in Treatment: 7 Encounter Discharge Information Items Discharge Condition: Stable Ambulatory Status: Wheelchair Discharge Destination: Home Transportation: Private Auto Accompanied By: family Schedule Follow-up Appointment: Yes Clinical Summary of Care: Electronic Signature(s) Signed: 11/19/2019 5:50:34 PM By: Shawn Stall Entered By: Shawn Stall on 11/19/2019 17:47:32 -------------------------------------------------------------------------------- Lower Extremity Assessment Details Patient Name: Date of Service: [name] 11/19/2019 2:45 PM Medical  Record Number: [id number] Patient Account Number: [id number] Date of Birth/Sex: Treating RN: May 04, 1965 (54 y.o. [name] Primary Care Abbigayle Toole: [name] Other Clinician: Referring Nadalee Neiswender: Treating Donal Lynam/Extender: [name] in Treatment: 7 Edema Assessment Assessed: [Left: No] [Right: No] Edema: [Left: Yes] [Right: Yes] Calf Left: Right: Point of Measurement: From Medial Instep 47.5 cm 40.2 cm Ankle Left: Right: Point of Measurement: From Medial Instep 25.5 cm 25.7 cm Vascular Assessment Pulses: Dorsalis Pedis Palpable: [Left:Yes] [Right:Yes] Electronic Signature(s) Signed: 11/19/2019 5:42:53 PM By: Zenaida Deed RN, BSN Entered By: Zenaida Deed on 11/19/2019 15:47:24 -------------------------------------------------------------------------------- Multi Wound Chart Details Patient Name: Date of Service: [name], [name] 11/19/2019 2:45 PM Medical Record Number: [id number] Patient Account Number: [id number] Date of Birth/Sex: Treating RN: 1965-04-11 (54 y.o. M) Primary Care Deysi Soldo: [name] Other Clinician: Referring Jeselle Hiser: Treating Marketia Stallsmith/Extender: [name] in Treatment: 7 Vital Signs Height(in): 68 Pulse(bpm): 65 Weight(lbs): 235 Blood Pressure(mmHg): 130/62 Body Mass Index(BMI): 36 Temperature(F): 97.6 Respiratory Rate(breaths/min): 18 Photos: [1:No Photos Right Metatarsal head fifth] [2:No Photos Right Calcaneus] [3:No Photos Left Metatarsal head fifth] Wound Location: [1:Gradually Appeared] [2:Gradually Appeared] [3:Gradually Appeared] Wounding Event: [1:Pressure Ulcer] [2:Pressure Ulcer] [3:Pressure Ulcer] Primary Etiology: [1:Hypotension, Type II Diabetes] [2:Hypotension, Type II Diabetes] [3:Hypotension, Type II Diabetes] Comorbid History: [1:08/12/2019] [2:08/12/2019] [3:08/12/2019] Date Acquired: [1:7] [2:7] [3:7] Weeks of Treatment: [1:Open] [2:Open]  [3:Open] Wound Status: [1:3.5x1.8x0.3] [2:0.2x0.7x0.1] [3:3.8x2.5x0.9] Measurements L x W x D (cm) [1:4.948] [2:0.11] [3:7.461] A (cm) : rea [1:1.484] [2:0.011] [3:6.715] Volume (cm) : [1:-15.80%] [2:93.00%] [3:-31.90%] % Reduction in A rea: [1:-247.50%] [2:96.50%] [3:-1088.50%] % Reduction in Volume: [1:Category/Stage IV] [2:Category/Stage III] [3:Category/Stage IV] Classification: [1:Medium] [2:Small] [3:Medium] Exudate A mount: [1:Serosanguineous] [2:Serosanguineous] [3:Serosanguineous] Exudate Type: [1:red, brown] [2:red, brown] [3:red, brown] Exudate Color: [1:Distinct, outline attached] [2:Flat and Intact] [3:Distinct, outline attached] Wound Margin: [1:Large (67-100%)] [2:Large (67-100%)] [3:Medium (34-66%)] Granulation A mount: [1:Red, Pink] [2:Red] [3:Red] Granulation Quality: [1:Small (1-33%)] [2:None Present (0%)] [3:Medium (34-66%)] Necrotic A mount: [1:Fat Layer (Subcutaneous Tissue): Yes Fat Layer (Subcutaneous Tissue): Yes Fat Layer (Subcutaneous Tissue): Yes] Exposed Structures: [1:Tendon: Yes Fascia: No Muscle: No Joint: No Bone: No Small (1-33%)] [2:Fascia: No Tendon: No Muscle: No Joint: No Bone: No Small (1-33%)] [3:Tendon: Yes Muscle: Yes Fascia: No Joint: No Bone: No None] Epithelialization: [1:Compression Therapy] [2:Compression Therapy] [3:Compression Therapy] Wound Number: 4 N/A N/A Photos: No Photos N/A N/A Left, Lateral Lower Leg N/A N/A Wound Location: Pressure Injury N/A N/A Wounding Event: Pressure Ulcer N/A N/A Primary Etiology: Hypotension, Type II Diabetes N/A N/A Comorbid History: 08/12/2019 N/A N/A Date Acquired: 7 N/A N/A Weeks of Treatment: Open N/A N/A Wound Status: 1.6x1.2x0.1 N/A N/A Measurements L x W x D (cm) 1.508 N/A N/A A (cm) : rea 0.151 N/A N/A Volume (cm) : 48.80% N/A N/A % Reduction  in A rea: 48.80% N/A N/A % Reduction in Volume: Category/Stage III N/A N/A Classification: Small N/A N/A Exudate A  mount: Serosanguineous N/A N/A Exudate Type: red, brown N/A N/A Exudate Color: Distinct, outline attached N/A N/A Wound Margin: Large (67-100%) N/A N/A Granulation A mount: Red N/A N/A Granulation Quality: Small (1-33%) N/A N/A Necrotic Amount: Fat Layer (Subcutaneous Tissue): Yes N/A N/A Exposed Structures: Fascia: No Tendon: No Muscle: No Joint: No Bone: No Small (1-33%) N/A N/A Epithelialization: Compression Therapy N/A N/A Procedures Performed: Treatment Notes Wound #1 (Right Metatarsal head fifth) 1. Cleanse With Wound Cleanser Soap and water 2. Periwound Care Moisturizing lotion 3. Primary Dressing Applied Iodoflex 4. Secondary Dressing ABD Pad 6. Support Layer Applied 4 layer compression wrap Notes netting. Wound #2 (Right Calcaneus) 1. Cleanse With Wound Cleanser Soap and water 2. Periwound Care Moisturizing lotion 3. Primary Dressing Applied Iodoflex 4. Secondary Dressing Heel Cup 6. Support Layer Applied 4 layer compression wrap Notes netting. Wound #3 (Left Metatarsal head fifth) 1. Cleanse With Wound Cleanser Soap and water 2. Periwound Care Moisturizing lotion 3. Primary Dressing Applied Iodoflex 4. Secondary Dressing ABD Pad 6. Support Layer Applied 4 layer compression wrap Notes netting. Wound #4 (Left, Lateral Lower Leg) 1. Cleanse With Wound Cleanser Soap and water 2. Periwound Care Moisturizing lotion 3. Primary Dressing Applied Iodoflex 4. Secondary Dressing ABD Pad 6. Support Layer Applied 4 layer compression wrap Notes netting. Electronic Signature(s) Signed: 11/22/2019 5:03:49 PM By: Baltazar Najjar MD Entered By: Baltazar Najjar on 11/21/2019 08:34:05 -------------------------------------------------------------------------------- Multi-Disciplinary Care Plan Details Patient Name: Date of Service: [name], [name] 11/19/2019 2:45 PM Medical Record Number: [id number] Patient Account Number: [id number] Date  of Birth/Sex: Treating RN: [birth year] (54 y.o. [name] Primary Care Samella Lucchetti: [name] Other Clinician: Referring Aziz Slape: Treating Marrie Chandra/Extender: [name] in Treatment: 7 Active Inactive Nutrition Nursing Diagnoses: Impaired glucose control: actual or potential Goals: Patient/caregiver verbalizes understanding of need to maintain therapeutic glucose control per primary care physician Date Initiated: 10/01/2019 Target Resolution Date: 12/03/2019 Goal Status: Active Interventions: Provide education on elevated blood sugars and impact on wound healing Notes: Pressure Nursing Diagnoses: Knowledge deficit related to causes and risk factors for pressure ulcer development Goals: Patient/caregiver will verbalize risk factors for pressure ulcer development Date Initiated: 10/01/2019 Target Resolution Date: 12/03/2019 Goal Status: Active Interventions: Provide education on pressure ulcers Notes: Wound/Skin Impairment Nursing Diagnoses: Impaired tissue integrity Goals: Ulcer/skin breakdown will have a volume reduction of 50% by week 8 Date Initiated: 10/01/2019 Target Resolution Date: 12/03/2019 Goal Status: Active Interventions: Provide education on ulcer and skin care Notes: Electronic Signature(s) Signed: 11/19/2019 5:35:06 PM By: Cherylin Mylar Entered By: Cherylin Mylar on 11/19/2019 16:25:38 -------------------------------------------------------------------------------- Pain Assessment Details Patient Name: Date of Service: [name], [name] 11/19/2019 2:45 PM Medical Record Number: [id number] Patient Account Number: [id number] Date of Birth/Sex: Treating RN: [birth year] (54 y.o. [name] Primary Care Kortlynn Poust: [name] Other Clinician: Referring Mindy Gali: Treating Foxx Klarich/Extender: [name] in Treatment: 7 Active Problems Location of Pain Severity and  Description of Pain Patient Has Paino No Site Locations Rate the pain. Current Pain Level: 0 Pain Management and Medication Current Pain Management: Electronic Signature(s) Signed: 11/19/2019 5:42:53 PM By: Zenaida Deed RN, BSN Entered By: Zenaida Deed on 11/19/2019 15:33:56 -------------------------------------------------------------------------------- Patient/Caregiver Education Details Patient Name: Date of Service: [name] 10/8/2021andnbsp2:45 PM Medical Record Number: [id number] Patient Account Number: [id number] Date of Birth/Gender: Treating RN: 1965/10/07 (54 y.o.  Katherina RightM) Dwiggins, Shannon Primary Care Physician: Gwinda PasseEdwards, Michelle Other Clinician: Referring Physician: Treating Physician/Extender: Aurelio Brashobson, Michael Edwards, Michelle Weeks in Treatment: 7 Education Assessment Education Provided To: Patient Education Topics Provided Pressure: Handouts: Pressure Ulcers: Care and Offloading Methods: Explain/Verbal Responses: State content correctly Wound/Skin Impairment: Handouts: Caring for Your Ulcer Methods: Explain/Verbal Responses: State content correctly Electronic Signature(s) Signed: 11/19/2019 5:35:06 PM By: Cherylin Mylarwiggins, Shannon Entered By: Cherylin Mylarwiggins, Shannon on 11/19/2019 16:25:56 -------------------------------------------------------------------------------- Wound Assessment Details Patient Name: Date of Service: [name], [name] 11/19/2019 2:45 PM Medical Record Number: [id number] Patient Account Number: [id number] Date of Birth/Sex: Treating RN: 1965/08/06 (54 y.o. [name]) Boehlein, Linda Primary Care Joceline Hinchcliff: Gwinda PasseEdwards, Michelle Other Clinician: Referring Bethzaida Boord: Treating Shane Badeaux/Extender: Aurelio Brashobson, Michael Edwards, Michelle Weeks in Treatment: 7 Wound Status Wound Number: 1 Primary Etiology: Pressure Ulcer Wound Location: Right Metatarsal head fifth Wound Status: Open Wounding Event: Gradually Appeared Comorbid History: Hypotension, Type  II Diabetes Date Acquired: 08/12/2019 Weeks Of Treatment: 7 Clustered Wound: No Wound Measurements Length: (cm) 3.5 Width: (cm) 1.8 Depth: (cm) 0.3 Area: (cm) 4.948 Volume: (cm) 1.484 % Reduction in Area: -15.8% % Reduction in Volume: -247.5% Epithelialization: Small (1-33%) Tunneling: No Undermining: No Wound Description Classification: Category/Stage IV Wound Margin: Distinct, outline attached Exudate Amount: Medium Exudate Type: Serosanguineous Exudate Color: red, brown Foul Odor After Cleansing: No Slough/Fibrino Yes Wound Bed Granulation Amount: Large (67-100%) Exposed Structure Granulation Quality: Red, Pink Fascia Exposed: No Necrotic Amount: Small (1-33%) Fat Layer (Subcutaneous Tissue) Exposed: Yes Necrotic Quality: Adherent Slough Tendon Exposed: Yes Muscle Exposed: No Joint Exposed: No Bone Exposed: No Treatment Notes Wound #1 (Right Metatarsal head fifth) 1. Cleanse With Wound Cleanser Soap and water 2. Periwound Care Moisturizing lotion 3. Primary Dressing Applied Iodoflex 4. Secondary Dressing ABD Pad 6. Support Layer Applied 4 layer compression wrap Notes netting. Electronic Signature(s) Signed: 11/19/2019 5:42:53 PM By: Zenaida DeedBoehlein, Linda RN, BSN Entered By: Zenaida DeedBoehlein, Linda on 11/19/2019 15:57:16 -------------------------------------------------------------------------------- Wound Assessment Details Patient Name: Date of Service: [name], [name] 11/19/2019 2:45 PM Medical Record Number: [id number] Patient Account Number: [id number] Date of Birth/Sex: Treating RN: 1965/08/06 (54 y.o. [name]) Boehlein, Linda Primary Care Governor Matos: Gwinda PasseEdwards, Michelle Other Clinician: Referring Annissa Andreoni: Treating Tinya Cadogan/Extender: Aurelio Brashobson, Michael Edwards, Michelle Weeks in Treatment: 7 Wound Status Wound Number: 2 Primary Etiology: Pressure Ulcer Wound Location: Right Calcaneus Wound Status: Open Wounding Event: Gradually Appeared Comorbid History:  Hypotension, Type II Diabetes Date Acquired: 08/12/2019 Weeks Of Treatment: 7 Clustered Wound: No Wound Measurements Length: (cm) 0.2 Width: (cm) 0.7 Depth: (cm) 0.1 Area: (cm) 0.11 Volume: (cm) 0.011 % Reduction in Area: 93% % Reduction in Volume: 96.5% Epithelialization: Small (1-33%) Tunneling: No Undermining: No Wound Description Classification: Category/Stage III Wound Margin: Flat and Intact Exudate Amount: Small Exudate Type: Serosanguineous Exudate Color: red, brown Foul Odor After Cleansing: No Slough/Fibrino Yes Wound Bed Granulation Amount: Large (67-100%) Exposed Structure Granulation Quality: Red Fascia Exposed: No Necrotic Amount: None Present (0%) Fat Layer (Subcutaneous Tissue) Exposed: Yes Tendon Exposed: No Muscle Exposed: No Joint Exposed: No Bone Exposed: No Treatment Notes Wound #2 (Right Calcaneus) 1. Cleanse With Wound Cleanser Soap and water 2. Periwound Care Moisturizing lotion 3. Primary Dressing Applied Iodoflex 4. Secondary Dressing Heel Cup 6. Support Layer Applied 4 layer compression wrap Notes netting. Electronic Signature(s) Signed: 11/19/2019 5:42:53 PM By: Zenaida DeedBoehlein, Linda RN, BSN Entered By: Zenaida DeedBoehlein, Linda on 11/19/2019 15:57:33 -------------------------------------------------------------------------------- Wound Assessment Details Patient Name: Date of Service: [name], [name] 11/19/2019 2:45 PM Medical Record Number: [id number] Patient Account Number: [id number]  Date of Birth/Sex: Treating RN: 1965/04/23 (54 y.o. [name] Primary Care Bree Heinzelman: [name] Other Clinician: Referring Averey Trompeter: Treating Fumio Vandam/Extender: [name] in Treatment: 7 Wound Status Wound Number: 3 Primary Etiology: Pressure Ulcer Wound Location: Left Metatarsal head fifth Wound Status: Open Wounding Event: Gradually Appeared Comorbid History: Hypotension, Type II Diabetes Date Acquired:  08/12/2019 Weeks Of Treatment: 7 Clustered Wound: No Wound Measurements Length: (cm) 3.8 Width: (cm) 2.5 Depth: (cm) 0.9 Area: (cm) 7.461 Volume: (cm) 6.715 % Reduction in Area: -31.9% % Reduction in Volume: -1088.5% Epithelialization: None Tunneling: No Undermining: No Wound Description Classification: Category/Stage IV Wound Margin: Distinct, outline attached Exudate Amount: Medium Exudate Type: Serosanguineous Exudate Color: red, brown Foul Odor After Cleansing: No Slough/Fibrino Yes Wound Bed Granulation Amount: Medium (34-66%) Exposed Structure Granulation Quality: Red Fascia Exposed: No Necrotic Amount: Medium (34-66%) Fat Layer (Subcutaneous Tissue) Exposed: Yes Necrotic Quality: Adherent Slough Tendon Exposed: Yes Muscle Exposed: Yes Necrosis of Muscle: No Joint Exposed: No Bone Exposed: No Treatment Notes Wound #3 (Left Metatarsal head fifth) 1. Cleanse With Wound Cleanser Soap and water 2. Periwound Care Moisturizing lotion 3. Primary Dressing Applied Iodoflex 4. Secondary Dressing ABD Pad 6. Support Layer Applied 4 layer compression wrap Notes netting. Electronic Signature(s) Signed: 11/19/2019 5:42:53 PM By: Zenaida Deed RN, BSN Entered By: Zenaida Deed on 11/19/2019 15:57:59 -------------------------------------------------------------------------------- Wound Assessment Details Patient Name: Date of Service: [name], [name] 11/19/2019 2:45 PM Medical Record Number: [id number] Patient Account Number: [id number] Date of Birth/Sex: Treating RN: 08/05/1965 (54 y.o. [name] Primary Care Dylana Shaw: [name] Other Clinician: Referring Tali Coster: Treating Alyssa Rotondo/Extender: [name] in Treatment: 7 Wound Status Wound Number: 4 Primary Etiology: Pressure Ulcer Wound Location: Left, Lateral Lower Leg Wound Status: Open Wounding Event: Pressure Injury Comorbid History: Hypotension, Type II  Diabetes Date Acquired: 08/12/2019 Weeks Of Treatment: 7 Clustered Wound: No Wound Measurements Length: (cm) 1.6 Width: (cm) 1.2 Depth: (cm) 0.1 Area: (cm) 1.508 Volume: (cm) 0.151 % Reduction in Area: 48.8% % Reduction in Volume: 48.8% Epithelialization: Small (1-33%) Tunneling: No Undermining: No Wound Description Classification: Category/Stage III Wound Margin: Distinct, outline attached Exudate Amount: Small Exudate Type: Serosanguineous Exudate Color: red, brown Foul Odor After Cleansing: No Slough/Fibrino Yes Wound Bed Granulation Amount: Large (67-100%) Exposed Structure Granulation Quality: Red Fascia Exposed: No Necrotic Amount: Small (1-33%) Fat Layer (Subcutaneous Tissue) Exposed: Yes Necrotic Quality: Adherent Slough Tendon Exposed: No Muscle Exposed: No Joint Exposed: No Bone Exposed: No Treatment Notes Wound #4 (Left, Lateral Lower Leg) 1. Cleanse With Wound Cleanser Soap and water 2. Periwound Care Moisturizing lotion 3. Primary Dressing Applied Iodoflex 4. Secondary Dressing ABD Pad 6. Support Layer Applied 4 layer compression wrap Notes netting. Electronic Signature(s) Signed: 11/19/2019 5:42:53 PM By: Zenaida Deed RN, BSN Entered By: Zenaida Deed on 11/19/2019 15:58:17 -------------------------------------------------------------------------------- Vitals Details Patient Name: Date of Service: [name], [name] 11/19/2019 2:45 PM Medical Record Number: [id number] Patient Account Number: [id number] Date of Birth/Sex: Treating RN: 08-31-1965 (54 y.o. [name] Primary Care Jeremey Bascom: [name] Other Clinician: Referring Lynnet Hefley: Treating Milani Lowenstein/Extender: [name] in Treatment: 7 Vital Signs Time Taken: 15:33 Temperature (F): 97.6 Height (in): 68 Pulse (bpm): 65 Source: Stated Respiratory Rate (breaths/min): 18 Weight (lbs): 235 Blood Pressure (mmHg): 130/62 Source:  Stated Reference Range: 80 - 120 mg / dl Body Mass Index (BMI): 35.7 Electronic Signature(s) Signed: 11/19/2019 5:42:53 PM By: Zenaida Deed RN, BSN Entered By: Zenaida Deed on 11/19/2019  15:33:32 

## 2019-11-22 NOTE — Progress Notes (Signed)
FRANKE, MENTER ([id number]) Visit Report for 11/19/2019 HPI Details Patient Name: Date of Service: [name] 11/19/2019 2:45 PM Medical Record Number: [id number] Patient Account Number: [id number] Date of Birth/Sex: Treating RN: 1966-01-16 (54 y.o. M) Primary Care Provider: Juluis Mire Other Clinician: Referring Provider: Treating Provider/Extender: Fleet Contras in Treatment: 7 History of Present Illness HPI Description: ADMISSION 10/02/2018 This is a 54 year old Spanish-speaking man who arrived accompanied by his wife. 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 Apparently 6 weeks ago his wife noted blisters on his bilateral fifth metatarsal heads and heels which she feels was from excessive pressure on the foot rests of his wheelchair. He was seen in the emergency room early in August and then was admitted to hospital from 8/10 through 8/14 with cellulitis of his feet. He was ultimately discharged on Keflex. Arterial studies were done in the hospital that showed an ABI of 1.61 on the right and 1.36 on the left but triphasic waveforms on the left and biphasic on the right. DVT rule out study was negative. X-ray of the bilateral feet did not show osteomyelitis. They have been dealing with these wounds at home. I think they are using Betadine. He has Medicaid and does not have home health. He comes in today with wounds on his bilateral plantar fifth metatarsal heads Achilles area of both heels and an area on the left lateral leg. His wife explains this as he always laterally rotates his legs when he is sitting in the wheelchair or in bed. She even seemed to believe that before his injury he actually walked on the outside of his feet. 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 Paraplegia T1-T6 8/27; the  patient has 4 open wounds mirror-image areas over the plantar fifth metatarsal heads. Area on the right Achilles area and then on the left lateral calf. We have been using Iodoflex. He is on Eliquis 9/2 debridement I did last week caused copious amounts of bleeding which was difficult to control. He is on Eliquis. He has the 2 mirror-image wounds over the plantar fifth metatarsal heads in the area on the right Achilles and then an area on the left lateral calf. Both the fifth metatarsal head wounds on the left lateral calf of necrotic eschar on the surface. Black discoloration likely partially the silver nitrate we had to use last time 9/9; he arrives back in clinic today with the wounds on the plantar fifth metatarsal heads and exactly the same nonviable situation. He also has an area on the Achilles part of his right heel and the left lateral leg in a very similar situation. Nothing is making much progress I took some time to review his arterial studies from his hospitalization before he came to this clinic. On the right he had noncompressible ABIs with biphasic waveforms. They did not do a TBI in the right. On the left he had noncompressible ABIs at 1.36. However his TBI was 0.70 with triphasic waveforms at the dorsalis pedis he had a DVT rule out study that was negative. We have been using Iodoflex without much progress back in compression as he does not have access to wound care supplies 9/17; he comes back in with the same adherent eschar over both fifth met heads. I am really uncertain about this.. I used Iodoflex and last week  switch to Sorbact. The area on the left posterior calf still with adherent debris. The right heel looked better 9/24 unfortunately his interpreter had to leave before we got in the room. Not much change. The right fifth met head is better however the left is not much improved we have been using Iodoflex 10/1; he has the mirror-image wounds on the firth metatarsal head.  The area on the right looks better the area on the left still copious amounts of necrotic material which is probably subcutaneous and muscle. This is deep but I was able to get this down to a healthy surface albeit with an extensive debridement. We have been using Iodoflex. The area on the right heel looks like it is contracting. The patient has significant chronic venous insufficiency and lymphedema. I have been keeping him meaning in compression for this reason and also this allows [place] to keep the dressings on all week. The patient does not have insurance 10/8; Bilateral 5th met head wounds likely pressure in the setting of paraplegia. Also right posterior right heel and left lat calf. Making progress with Iodoflex Electronic Signature(s) Signed: 11/22/2019 5:03:49 PM By: Linton Ham MD Entered By: Linton Ham on 11/21/2019 08:36:55 -------------------------------------------------------------------------------- Physical Exam Details Patient Name: Date of Service: [name], [name] 11/19/2019 2:45 PM Medical Record Number: [id number] Patient Account Number: [id number] Date of Birth/Sex: Treating RN: Mar 20, 1965 (54 y.o. M) Primary Care Provider: Juluis Mire Other Clinician: Referring Provider: Treating Provider/Extender: Fleet Contras in Treatment: 7 Cardiovascular DP pulses palpable.. Edema control is better under our compression. Integumentary (Hair, Skin) no suspicious lesion seen. Notes wound exam; deep wounds on the fifth met heads finally have a health looking surface especially on the left which has bee the most refractory. small linear area on the posterior right heel. More superficial area on the left lat calf Electronic Signature(s) Signed: 11/22/2019 5:03:49 PM By: Linton Ham MD Entered By: Linton Ham on 11/21/2019 08:41:50 -------------------------------------------------------------------------------- Physician Orders  Details Patient Name: Date of Service: [name], [name] 11/19/2019 2:45 PM Medical Record Number: [id number] Patient Account Number: [id number] Date of Birth/Sex: Treating RN: [birth year] (54 y.o. [name] Primary Care Provider: Juluis Mire Other Clinician: Referring Provider: Treating Provider/Extender: Fleet Contras in Treatment: 7 Verbal / Phone Orders: No Diagnosis Coding ICD-10 Coding Code Description L97.518 Non-pressure chronic ulcer of other part of right foot with other specified severity L97.528 Non-pressure chronic ulcer of other part of left foot with other specified severity L89.613 Pressure ulcer of right heel, stage 3 L89.623 Pressure ulcer of left heel, stage 3 E11.621 Type 2 diabetes mellitus with foot ulcer L97.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 G82.21 Paraplegia, complete Follow-up Appointments Return Appointment in 1 week. Dressing Change Frequency Do not change entire dressing for one week. - all wounds Skin Barriers/Peri-Wound Care Moisturizing lotion - both legs. Wound Cleansing May shower with protection. - Dressing cannot get wet. Use cast protector if taking a shower Primary Wound Dressing Wound #1 Right Metatarsal head fifth Iodoflex Wound #2 Right Calcaneus Iodoflex Wound #3 Left Metatarsal head fifth Iodoflex Wound #4 Left,Lateral Lower Leg Iodoflex Secondary Dressing Wound #1 Right Metatarsal head fifth Dry Gauze Wound #2 Right Calcaneus Dry Gauze Heel Cup Wound #3 Left Metatarsal head fifth Dry Gauze Wound #4 Left,Lateral Lower Leg Dry Gauze ABD pad - felt both metatarsal heads to offer additional offloading Edema Control 4 layer compression - Bilateral Elevate legs to the  level of the heart or above for 30 minutes daily and/or when sitting, a frequency of: - throughout the day Off-Loading Other: - avoid pressure to sites. Electronic  Signature(s) Signed: 11/19/2019 5:35:06 PM By: Kela Millin Signed: 11/22/2019 5:03:49 PM By: Linton Ham MD Entered By: Kela Millin on 11/19/2019 16:25:30 -------------------------------------------------------------------------------- Problem List Details Patient Name: Date of Service: [name], [name] 11/19/2019 2:45 PM Medical Record Number: [id number] Patient Account Number: [id number] Date of Birth/Sex: Treating RN: 1965-04-08 (54 y.o. [name] Primary Care Provider: Juluis Mire Other Clinician: Referring Provider: Treating Provider/Extender: Fleet Contras in Treatment: 7 Active Problems ICD-10 Encounter Code Description Active Date MDM Diagnosis L97.518 Non-pressure chronic ulcer of other part of right foot with other specified 10/01/2019 No Yes severity L97.528 Non-pressure chronic ulcer of other part of left foot with other specified 10/01/2019 No Yes severity L89.613 Pressure ulcer of right heel, stage 3 10/01/2019 No Yes L89.623 Pressure ulcer of left heel, stage 3 10/01/2019 No Yes E11.621 Type 2 diabetes mellitus with foot ulcer 10/01/2019 No Yes L97.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 10/01/2019 No Yes G82.21 Paraplegia, complete 10/01/2019 No Yes Inactive Problems Resolved Problems Electronic Signature(s) Signed: 11/22/2019 5:03:49 PM By: Linton Ham MD Previous Signature: 11/19/2019 5:35:06 PM Version By: Kela Millin Entered By: Linton Ham on 11/21/2019 08:33:52 -------------------------------------------------------------------------------- Progress Note Details Patient Name: Date of Service: [name], [name] 11/19/2019 2:45 PM Medical Record Number: [id number] Patient Account Number: [id number] Date of Birth/Sex: Treating RN: 1965-06-14 (54 y.o. M) Primary Care Provider: Juluis Mire Other Clinician: Referring Provider: Treating  Provider/Extender: Fleet Contras in Treatment: 7 Subjective History of Present Illness (HPI) ADMISSION 10/02/2018 This is a 54 year old Spanish-speaking man who arrived accompanied by his wife. 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 Apparently 6 weeks ago his wife noted blisters on his bilateral fifth metatarsal heads and heels which she feels was from excessive pressure on the foot rests of his wheelchair. He was seen in the emergency room early in August and then was admitted to hospital from 8/10 through 8/14 with cellulitis of his feet. He was ultimately discharged on Keflex. Arterial studies were done in the hospital that showed an ABI of 1.61 on the right and 1.36 on the left but triphasic waveforms on the left and biphasic on the right. DVT rule out study was negative. X-ray of the bilateral feet did not show osteomyelitis. They have been dealing with these wounds at home. I think they are using Betadine. He has Medicaid and does not have home health. He comes in today with wounds on his bilateral plantar fifth metatarsal heads Achilles area of both heels and an area on the left lateral leg. His wife explains this as he always laterally rotates his legs when he is sitting in the wheelchair or in bed. She even seemed to believe that before his injury he actually walked on the outside of his feet. 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 Paraplegia T1-T6 8/27; the patient has 4 open wounds mirror-image areas over the plantar fifth metatarsal heads. Area on the right Achilles area and then on the left lateral calf. We have been using Iodoflex. He is on Eliquis 9/2 debridement I did last week caused copious amounts of bleeding which was difficult to  control. He is on Eliquis. He has the 2 mirror-image wounds  over the plantar fifth metatarsal heads in the area on the right Achilles and then an area on the left lateral calf. Both the fifth metatarsal head wounds on the left lateral calf of necrotic eschar on the surface. Black discoloration likely partially the silver nitrate we had to use last time 9/9; he arrives back in clinic today with the wounds on the plantar fifth metatarsal heads and exactly the same nonviable situation. He also has an area on the Achilles part of his right heel and the left lateral leg in a very similar situation. Nothing is making much progress I took some time to review his arterial studies from his hospitalization before he came to this clinic. On the right he had noncompressible ABIs with biphasic waveforms. They did not do a TBI in the right. On the left he had noncompressible ABIs at 1.36. However his TBI was 0.70 with triphasic waveforms at the dorsalis pedis he had a DVT rule out study that was negative. We have been using Iodoflex without much progress back in compression as he does not have access to wound care supplies 9/17; he comes back in with the same adherent eschar over both fifth met heads. I am really uncertain about this.. I used Iodoflex and last week switch to Sorbact. The area on the left posterior calf still with adherent debris. The right heel looked better 9/24 unfortunately his interpreter had to leave before we got in the room. Not much change. The right fifth met head is better however the left is not much improved we have been using Iodoflex 10/1; he has the mirror-image wounds on the firth metatarsal head. The area on the right looks better the area on the left still copious amounts of necrotic material which is probably subcutaneous and muscle. This is deep but I was able to get this down to a healthy surface albeit with an extensive debridement. We have been using Iodoflex. The area on the right heel looks like it is contracting. The patient has  significant chronic venous insufficiency and lymphedema. I have been keeping him meaning in compression for this reason and also this allows [place] to keep the dressings on all week. The patient does not have insurance 10/8; Bilateral 5th met head wounds likely pressure in the setting of paraplegia. Also right posterior right heel and left lat calf. Making progress with Iodoflex Objective Constitutional Vitals Time Taken: 3:33 PM, Height: 68 in, Source: Stated, Weight: 235 lbs, Source: Stated, BMI: 35.7, Temperature: 97.6 F, Pulse: 65 bpm, Respiratory Rate: 18 breaths/min, Blood Pressure: 130/62 mmHg. Cardiovascular DP pulses palpable.. Edema control is better under our compression. General Notes: wound exam; deep wounds on the fifth met heads finally have a health looking surface especially on the left which has bee the most refractory. small linear area on the posterior right heel. More superficial area on the left lat calf Integumentary (Hair, Skin) no suspicious lesion seen. Wound #1 status is Open. Original cause of wound was Gradually Appeared. The wound is located on the Right Metatarsal head fifth. The wound measures 3.5cm length x 1.8cm width x 0.3cm depth; 4.948cm^2 area and 1.484cm^3 volume. There is tendon and Fat Layer (Subcutaneous Tissue) exposed. There is no tunneling or undermining noted. There is a medium amount of serosanguineous drainage noted. The wound margin is distinct with the outline attached to the wound base. There is large (67-100%) red, pink granulation within the wound  bed. There is a small (1-33%) amount of necrotic tissue within the wound bed including Adherent Slough. Wound #2 status is Open. Original cause of wound was Gradually Appeared. The wound is located on the Right Calcaneus. The wound measures 0.2cm length x 0.7cm width x 0.1cm depth; 0.11cm^2 area and 0.011cm^3 volume. There is Fat Layer (Subcutaneous Tissue) exposed. There is no tunneling or  undermining noted. There is a small amount of serosanguineous drainage noted. The wound margin is flat and intact. There is large (67-100%) red granulation within the wound bed. There is no necrotic tissue within the wound bed. Wound #3 status is Open. Original cause of wound was Gradually Appeared. The wound is located on the Left Metatarsal head fifth. The wound measures 3.8cm length x 2.5cm width x 0.9cm depth; 7.461cm^2 area and 6.715cm^3 volume. There is muscle, tendon, and Fat Layer (Subcutaneous Tissue) exposed. There is no tunneling or undermining noted. There is a medium amount of serosanguineous drainage noted. The wound margin is distinct with the outline attached to the wound base. There is medium (34-66%) red granulation within the wound bed. There is a medium (34-66%) amount of necrotic tissue within the wound bed including Adherent Slough. Wound #4 status is Open. Original cause of wound was Pressure Injury. The wound is located on the Left,Lateral Lower Leg. The wound measures 1.6cm length x 1.2cm width x 0.1cm depth; 1.508cm^2 area and 0.151cm^3 volume. There is Fat Layer (Subcutaneous Tissue) exposed. There is no tunneling or undermining noted. There is a small amount of serosanguineous drainage noted. The wound margin is distinct with the outline attached to the wound base. There is large (67- 100%) red granulation within the wound bed. There is a small (1-33%) amount of necrotic tissue within the wound bed including Adherent Slough. 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 There was a Four Layer Compression Therapy Procedure by Deon Pilling, RN. Post procedure Diagnosis Wound #1: Same as Pre-Procedure Wound #2 Pre-procedure diagnosis of Wound #2 is a Pressure Ulcer located on the Right Calcaneus . There was a Four Layer Compression Therapy Procedure by Deon Pilling, RN. Post procedure Diagnosis Wound #2: Same as Pre-Procedure Wound #3 Pre-procedure diagnosis of Wound #3 is a Pressure Ulcer located on the Left Metatarsal head fifth . There was a Four Layer Compression Therapy Procedure by Deon Pilling, RN. Post procedure Diagnosis Wound #3: Same as Pre-Procedure Wound #4 Pre-procedure diagnosis of Wound #4 is a Pressure Ulcer located on the Left,Lateral Lower Leg . There was a Four Layer Compression Therapy Procedure by Deon Pilling, RN. Post procedure Diagnosis Wound #4: Same as Pre-Procedure Plan Follow-up Appointments: Return Appointment in 1 week. Dressing Change Frequency: Do not change entire dressing for one week. - all wounds Skin Barriers/Peri-Wound Care: Moisturizing lotion - both legs. Wound Cleansing: May shower with protection. - Dressing cannot get wet. Use cast protector if taking a shower Primary Wound Dressing: Wound #1 Right Metatarsal head fifth: Iodoflex Wound #2 Right Calcaneus: Iodoflex Wound #3 Left Metatarsal head fifth: Iodoflex Wound #4 Left,Lateral Lower Leg: Iodoflex  Secondary Dressing: Wound #1 Right Metatarsal head fifth: Dry Gauze Wound #2 Right Calcaneus: Dry Gauze Heel Cup Wound #3 Left Metatarsal head fifth: Dry Gauze Wound #4 Left,Lateral Lower Leg: Dry Gauze ABD pad - felt both metatarsal heads to offer additional offloading Edema Control: 4 layer compression - Bilateral Elevate legs to the level of the heart or above for 30 minutes daily and/or when sitting, a frequency of: - throughout the  day Off-Loading: Other: - avoid pressure to sites. 1 continue iodoflex for 1 more week then change to collagen based dressing 2 no inusrance for [employer] 3 They are offloading 4 ABI's not compressilbe but pulses palpable. Dont' believe there is an arterial issue 5 Wound on the plantar feet ae deep but no exposed bone and based on clinical exam no evidence of infection 6 Questions answered through the interpreter Electronic Signature(s) Signed: 11/22/2019 5:03:49 PM By: Linton Ham MD Entered By: Linton Ham on 11/21/2019 08:48:48 -------------------------------------------------------------------------------- SuperBill Details Patient Name: Date of Service: [name], [place] 11/19/2019 Medical Record Number: [id number] Patient Account Number: [id number] Date of Birth/Sex: Treating RN: 08/08/1965 (54 y.o. [name] Primary Care Provider: Juluis Mire Other Clinician: Referring Provider: Treating Provider/Extender: Fleet Contras in Treatment: 7 Diagnosis Coding ICD-10 Codes Code Description [phone] Non-pressure chronic ulcer of other part of right foot with other specified severity L97.528 Non-pressure chronic ulcer of other part of left foot with other specified severity L89.613 Pressure ulcer of right heel, stage 3 L89.623 Pressure ulcer of left heel, stage 3 E11.621 Type 2 diabetes mellitus with foot ulcer L97.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 G82.21 Paraplegia, complete Facility Procedures CPT4: Code [id number] Description: 96 BILATERAL: Application of multi-layer venous compression system; leg (below knee), including ankle and t. 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

## 2019-11-25 ENCOUNTER — Encounter (HOSPITAL_BASED_OUTPATIENT_CLINIC_OR_DEPARTMENT_OTHER): Payer: Medicaid Other | Admitting: Internal Medicine

## 2019-11-25 DIAGNOSIS — E11621 Type 2 diabetes mellitus with foot ulcer: Secondary | ICD-10-CM | POA: Diagnosis not present

## 2019-11-25 NOTE — Progress Notes (Signed)
[name], [name] ([id number]) Visit Report for 11/25/2019 Debridement Details Patient Name: Date of Service: [name], [place] 11/25/2019 8:45 A M Medical Record Number: [id number] Patient Account Number: [id number] Date of Birth/Sex: Treating RN: Aug 21, 1965 (54 y.o. [name] Primary Care Provider: Juluis Mire Other Clinician: Referring Provider: Treating Provider/Extender: Fleet Contras in Treatment: 7 Debridement Performed for Assessment: Wound #1 Right Metatarsal head fifth Performed By: Physician Ricard Dillon., MD Debridement Type: Debridement Level of Consciousness (Pre-procedure): Awake and Alert Pre-procedure Verification/Time Out Yes - 09:35 Taken: Start Time: 09:35 Pain Control: Lidocaine 4% T opical Solution T Area Debrided (L x W): otal 3 (cm) x 1.1 (cm) = 3.3 (cm) Tissue and other material debrided: Viable, Non-Viable, Slough, Subcutaneous, Skin: Dermis , Fibrin/Exudate, Slough Level: Skin/Subcutaneous Tissue Debridement Description: Excisional Instrument: Curette Bleeding: Moderate Hemostasis Achieved: Pressure End Time: 09:40 Procedural Pain: 0 Post Procedural Pain: 0 Response to Treatment: Procedure was tolerated well Level of Consciousness (Post- Awake and Alert procedure): Post Debridement Measurements of Total Wound Length: (cm) 3 Stage: Category/Stage IV Width: (cm) 1.1 Depth: (cm) 0.5 Volume: (cm) 1.296 Character of Wound/Ulcer Post Debridement: Improved Post Procedure Diagnosis Same as Pre-procedure Electronic Signature(s) Signed: 11/25/2019 5:33:07 PM By: Linton Ham MD Signed: 11/25/2019 5:51:00 PM By: Deon Pilling Entered By: Linton Ham on 11/25/2019 09:48:46 -------------------------------------------------------------------------------- Debridement Details Patient Name: Date of Service: [name], [name] 11/25/2019 8:45 A M Medical Record Number: [id number] Patient Account Number:  [id number] Date of Birth/Sex: Treating RN: 08-Nov-1965 (54 y.o. [name] Primary Care Provider: Juluis Mire Other Clinician: Referring Provider: Treating Provider/Extender: Fleet Contras in Treatment: 7 Debridement Performed for Assessment: Wound #3 Left Metatarsal head fifth Performed By: Physician Ricard Dillon., MD Debridement Type: Debridement Level of Consciousness (Pre-procedure): Awake and Alert Pre-procedure Verification/Time Out Yes - 09:35 Taken: Start Time: 09:35 Pain Control: Lidocaine 4% T opical Solution T Area Debrided (L x W): otal 3.7 (cm) x 2.7 (cm) = 9.99 (cm) Tissue and other material debrided: Viable, Non-Viable, Slough, Subcutaneous, Skin: Dermis , Fibrin/Exudate, Slough Level: Skin/Subcutaneous Tissue Debridement Description: Excisional Instrument: Curette Bleeding: Moderate Hemostasis Achieved: Pressure End Time: 09:40 Procedural Pain: 0 Post Procedural Pain: 0 Response to Treatment: Procedure was tolerated well Level of Consciousness (Post- Awake and Alert procedure): Post Debridement Measurements of Total Wound Length: (cm) 3.7 Stage: Category/Stage IV Width: (cm) 2.7 Depth: (cm) 0.9 Volume: (cm) 7.062 Character of Wound/Ulcer Post Debridement: Improved Post Procedure Diagnosis Same as Pre-procedure Electronic Signature(s) Signed: 11/25/2019 5:33:07 PM By: Linton Ham MD Signed: 11/25/2019 5:51:00 PM By: Deon Pilling Entered By: Linton Ham on 11/25/2019 09:48:56 -------------------------------------------------------------------------------- Debridement Details Patient Name: Date of Service: [name], [name] 11/25/2019 8:45 A M Medical Record Number: [id number] Patient Account Number: [id number] Date of Birth/Sex: Treating RN: Jun 27, 1965 (54 y.o. [name] Primary Care Provider: Juluis Mire Other Clinician: Referring Provider: Treating Provider/Extender: Fleet Contras in Treatment: 7 Debridement Performed for Assessment: Wound #4 Left,Lateral Lower Leg Performed By: Physician Ricard Dillon., MD Debridement Type: Debridement Level of Consciousness (Pre-procedure): Awake and Alert Pre-procedure Verification/Time Out Yes - 09:35 Taken: Start Time: 09:35 Pain Control: Lidocaine 4% T opical Solution T Area Debrided (L x W): otal 2 (cm) x 1.3 (cm) = 2.6 (cm) Tissue and other material debrided: Viable, Non-Viable, Eschar, Slough, Subcutaneous, Skin: Dermis , Fibrin/Exudate, Slough Level: Skin/Subcutaneous Tissue Debridement Description: Excisional Instrument: Curette Bleeding: Moderate Hemostasis Achieved: Pressure End Time: 09:40 Procedural Pain:  0 Post Procedural Pain: 0 Response to Treatment: Procedure was tolerated well Level of Consciousness (Post- Awake and Alert procedure): Post Debridement Measurements of Total Wound Length: (cm) 2 Stage: Category/Stage III Width: (cm) 1.3 Depth: (cm) 0.3 Volume: (cm) 0.613 Character of Wound/Ulcer Post Debridement: Improved Post Procedure Diagnosis Same as Pre-procedure Electronic Signature(s) Signed: 11/25/2019 5:33:07 PM By: Linton Ham MD Signed: 11/25/2019 5:51:00 PM By: Deon Pilling Entered By: Linton Ham on 11/25/2019 09:49:06 -------------------------------------------------------------------------------- HPI Details Patient Name: Date of Service: [name] 11/25/2019 8:45 A M Medical Record Number: [id number] Patient Account Number: [id number] Date of Birth/Sex: Treating RN: 1965/07/12 (54 y.o. [name] Primary Care Provider: Juluis Mire Other Clinician: Referring Provider: Treating Provider/Extender: Fleet Contras in Treatment: 7 History of Present Illness HPI Description: ADMISSION 10/02/2018 This is a 54 year old Spanish-speaking man who arrived accompanied by his wife. 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 Apparently 6 weeks ago his wife noted blisters on his bilateral fifth metatarsal heads and heels which she feels was from excessive pressure on the foot rests of his wheelchair. He was seen in the emergency room early in August and then was admitted to hospital from 8/10 through 8/14 with cellulitis of his feet. He was ultimately discharged on Keflex. Arterial studies were done in the hospital that showed an ABI of 1.61 on the right and 1.36 on the left but triphasic waveforms on the left and biphasic on the right. DVT rule out study was negative. X-ray of the bilateral feet did not show osteomyelitis. They have been dealing with these wounds at home. I think they are using Betadine. He has Medicaid and does not have home health. He comes in today with wounds on his bilateral plantar fifth metatarsal heads Achilles area of both heels and an area on the left lateral leg. His wife explains this as he always laterally rotates his legs when he is sitting in the wheelchair or in bed. She even seemed to believe that before his injury he actually walked on the outside of his feet. 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 Paraplegia T1-T6 8/27; the patient has 4 open wounds mirror-image areas over the plantar fifth metatarsal heads. Area on the right Achilles area and then on the left lateral calf. We have been using Iodoflex. He is on Eliquis 9/2 debridement I did last week caused copious amounts of bleeding which was difficult to control. He is on Eliquis. He has the 2 mirror-image wounds over the plantar fifth metatarsal heads in the area on the right Achilles and then an area on the left lateral calf. Both the fifth metatarsal head wounds on the left lateral calf of necrotic eschar on the surface. Black discoloration  likely partially the silver nitrate we had to use last time 9/9; he arrives back in clinic today with the wounds on the plantar fifth metatarsal heads and exactly the same nonviable situation. He also has an area on the Achilles part of his right heel and the left lateral leg in a very similar situation. Nothing is making much progress I took some time to review his arterial studies from his hospitalization before he came to this clinic. On the right he had noncompressible ABIs with biphasic waveforms. They did not do a TBI in the right. On the left he  had noncompressible ABIs at 1.36. However his TBI was 0.70 with triphasic waveforms at the dorsalis pedis he had a DVT rule out study that was negative. We have been using Iodoflex without much progress back in compression as he does not have access to wound care supplies 9/17; he comes back in with the same adherent eschar over both fifth met heads. I am really uncertain about this.. I used Iodoflex and last week switch to Sorbact. The area on the left posterior calf still with adherent debris. The right heel looked better 9/24 unfortunately his interpreter had to leave before we got in the room. Not much change. The right fifth met head is better however the left is not much improved we have been using Iodoflex 10/1; he has the mirror-image wounds on the firth metatarsal head. The area on the right looks better the area on the left still copious amounts of necrotic material which is probably subcutaneous and muscle. This is deep but I was able to get this down to a healthy surface albeit with an extensive debridement. We have been using Iodoflex. The area on the right heel looks like it is contracting. The patient has significant chronic venous insufficiency and lymphedema. I have been keeping him meaning in compression for this reason and also this allows [place] to keep the dressings on all week. The patient does not have insurance 10/8; Bilateral 5th  met head wounds likely pressure in the setting of paraplegia. Also right posterior right heel and left lat calf. Making progress with Iodoflex 10/14; we continue to make good progress with the surface of the wounds over the fifth metatarsal heads these are deep punched out pressure ulcers in the setting of diabetes and paraplegia. Also the area on the left lateral calf. We have had continued improvement in the right heel wound. I change the primary dressing to silver collagen today I am hoping to stimulate some granulation here. We finally have the surfaces of the wounds commensurate with that goal Electronic Signature(s) Signed: 11/25/2019 5:33:07 PM By: Linton Ham MD Entered By: Linton Ham on 11/25/2019 09:50:27 -------------------------------------------------------------------------------- Physical Exam Details Patient Name: Date of Service: [name] 11/25/2019 8:45 A M Medical Record Number: [id number] Patient Account Number: [id number] Date of Birth/Sex: Treating RN: Oct 15, 1965 (54 y.o. [name] Primary Care Provider: Juluis Mire Other Clinician: Referring Provider: Treating Provider/Extender: Fleet Contras in Treatment: 7 Constitutional Sitting or standing Blood Pressure is within target range for patient.. Pulse regular and within target range for patient.Marland Kitchen Respirations regular, non-labored and within target range.. Temperature is normal and within the target range for the patient.Marland Kitchen Appears in no distress. Notes Wound exam; deep wounds on the fifth metatarsal heads still requiring debridement of very adherent necrotic debris although we are able to finally get this surface on both of these wounds to something that looks like it would support granulation. I also debrided necrotic surface off the left lateral leg. The area on the right heel Achilles area looks like it is close to closing. Fortunately we do not have a vascular  issue is peripheral pulses are palpable. No evidence of surrounding infection Electronic Signature(s) Signed: 11/25/2019 5:33:07 PM By: Linton Ham MD Entered By: Linton Ham on 11/25/2019 09:51:55 -------------------------------------------------------------------------------- Physician Orders Details Patient Name: Date of Service: [name] 11/25/2019 8:45 A M Medical Record Number: [id number] Patient Account Number: [id number] Date of Birth/Sex: Treating RN: Oct 28, 1965 (54 y.o. [name] Primary Care  Provider: Juluis Mire Other Clinician: Referring Provider: Treating Provider/Extender: Fleet Contras in Treatment: 7 Verbal / Phone Orders: No Diagnosis Coding ICD-10 Coding Code Description L97.518 Non-pressure chronic ulcer of other part of right foot with other specified severity L97.528 Non-pressure chronic ulcer of other part of left foot with other specified severity L89.613 Pressure ulcer of right heel, stage 3 L89.623 Pressure ulcer of left heel, stage 3 E11.621 Type 2 diabetes mellitus with foot ulcer L97.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 G82.21 Paraplegia, complete Follow-up Appointments Return Appointment in 1 week. Dressing Change Frequency Do not change entire dressing for one week. - all wounds Skin Barriers/Peri-Wound Care Moisturizing lotion - both legs. Wound Cleansing May shower with protection. - Dressing cannot get wet. Use cast protector if taking a shower Primary Wound Dressing Wound #1 Right Metatarsal head fifth Silver Collagen - moisten with hydrogel. Wound #2 Right Calcaneus Silver Collagen - moisten with hydrogel. Wound #3 Left Metatarsal head fifth Silver Collagen - moisten with hydrogel. Wound #4 Left,Lateral Lower Leg Silver Collagen - moisten with hydrogel. Secondary Dressing Wound #1 Right Metatarsal head fifth Dry Gauze Wound #2 Right  Calcaneus Dry Gauze Heel Cup Wound #3 Left Metatarsal head fifth Dry Gauze Wound #4 Left,Lateral Lower Leg Dry Gauze ABD pad - felt both metatarsal heads to offer additional offloading Edema Control 4 layer compression - Bilateral Elevate legs to the level of the heart or above for 30 minutes daily and/or when sitting, a frequency of: - throughout the day Off-Loading Other: - avoid pressure to sites. Electronic Signature(s) Signed: 11/25/2019 5:33:07 PM By: Linton Ham MD Signed: 11/25/2019 5:51:00 PM By: Deon Pilling Entered By: Deon Pilling on 11/25/2019 09:44:43 -------------------------------------------------------------------------------- Problem List Details Patient Name: Date of Service: [name] 11/25/2019 8:45 A M Medical Record Number: [id number] Patient Account Number: [id number] Date of Birth/Sex: Treating RN: [birth year] (54 y.o. Lorette Ang, [place] Primary Care Provider: Juluis Mire Other Clinician: Referring Provider: Treating Provider/Extender: Fleet Contras in Treatment: 7 Active Problems ICD-10 Encounter Code Description Active Date MDM Diagnosis L97.518 Non-pressure chronic ulcer of other part of right foot with other specified 10/01/2019 No Yes severity L97.528 Non-pressure chronic ulcer of other part of left foot with other specified 10/01/2019 No Yes severity L89.613 Pressure ulcer of right heel, stage 3 10/01/2019 No Yes L89.623 Pressure ulcer of left heel, stage 3 10/01/2019 No Yes E11.621 Type 2 diabetes mellitus with foot ulcer 10/01/2019 No Yes L97.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 10/01/2019 No Yes G82.21 Paraplegia, complete 10/01/2019 No Yes Inactive Problems Resolved Problems Electronic Signature(s) Signed: 11/25/2019 5:33:07 PM By: Linton Ham MD Entered By: Linton Ham on 11/25/2019  09:48:08 -------------------------------------------------------------------------------- Progress Note Details Patient Name: Date of Service: [name], [name] 11/25/2019 8:45 A M Medical Record Number: [id number] Patient Account Number: [id number] Date of Birth/Sex: Treating RN: May 04, 1965 (54 y.o. [name] Primary Care Provider: Juluis Mire Other Clinician: Referring Provider: Treating Provider/Extender: Fleet Contras in Treatment: 7 Subjective History of Present Illness (HPI) ADMISSION 10/02/2018 This is a 54 year old Spanish-speaking man who arrived accompanied by his wife. 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 Apparently 6 weeks ago his wife noted blisters on his bilateral fifth metatarsal heads and heels which she feels was from excessive pressure on the foot rests of his wheelchair. He was seen  in the emergency room early in August and then was admitted to hospital from 8/10 through 8/14 with cellulitis of his feet. He was ultimately discharged on Keflex. Arterial studies were done in the hospital that showed an ABI of 1.61 on the right and 1.36 on the left but triphasic waveforms on the left and biphasic on the right. DVT rule out study was negative. X-ray of the bilateral feet did not show osteomyelitis. They have been dealing with these wounds at home. I think they are using Betadine. He has Medicaid and does not have home health. He comes in today with wounds on his bilateral plantar fifth metatarsal heads Achilles area of both heels and an area on the left lateral leg. His wife explains this as he always laterally rotates his legs when he is sitting in the wheelchair or in bed. She even seemed to believe that before his injury he actually walked on the outside of his feet. 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 Paraplegia T1-T6 8/27; the patient has 4 open wounds mirror-image areas over the plantar fifth metatarsal heads. Area on the right Achilles area and then on the left lateral calf. We have been using Iodoflex. He is on Eliquis 9/2 debridement I did last week caused copious amounts of bleeding which was difficult to control. He is on Eliquis. He has the 2 mirror-image wounds over the plantar fifth metatarsal heads in the area on the right Achilles and then an area on the left lateral calf. Both the fifth metatarsal head wounds on the left lateral calf of necrotic eschar on the surface. Black discoloration likely partially the silver nitrate we had to use last time 9/9; he arrives back in clinic today with the wounds on the plantar fifth metatarsal heads and exactly the same nonviable situation. He also has an area on the Achilles part of his right heel and the left lateral leg in a very similar situation. Nothing is making much progress I took some time to review his arterial studies from his hospitalization before he came to this clinic. On the right he had noncompressible ABIs with biphasic waveforms. They did not do a TBI in the right. On the left he had noncompressible ABIs at 1.36. However his TBI was 0.70 with triphasic waveforms at the dorsalis pedis he had a DVT rule out study that was negative. We have been using Iodoflex without much progress back in compression as he does not have access to wound care supplies 9/17; he comes back in with the same adherent eschar over both fifth met heads. I am really uncertain about this.. I used Iodoflex and last week switch to Sorbact. The area on the left posterior calf still with adherent debris. The right heel looked better 9/24 unfortunately his interpreter had to leave before we got in the room. Not much change. The right fifth met head is better however the left is not much improved we have been using Iodoflex 10/1;  he has the mirror-image wounds on the firth metatarsal head. The area on the right looks better the area on the left still copious amounts of necrotic material which is probably subcutaneous and muscle. This is deep but I was able to get this down to a healthy surface albeit with an extensive debridement. We have been using Iodoflex. The area on the right heel looks like it is contracting. The patient has significant chronic  venous insufficiency and lymphedema. I have been keeping him meaning in compression for this reason and also this allows [place] to keep the dressings on all week. The patient does not have insurance 10/8; Bilateral 5th met head wounds likely pressure in the setting of paraplegia. Also right posterior right heel and left lat calf. Making progress with Iodoflex 10/14; we continue to make good progress with the surface of the wounds over the fifth metatarsal heads these are deep punched out pressure ulcers in the setting of diabetes and paraplegia. Also the area on the left lateral calf. We have had continued improvement in the right heel wound. I change the primary dressing to silver collagen today I am hoping to stimulate some granulation here. We finally have the surfaces of the wounds commensurate with that goal Objective Constitutional Sitting or standing Blood Pressure is within target range for patient.. Pulse regular and within target range for patient.Marland Kitchen Respirations regular, non-labored and within target range.. Temperature is normal and within the target range for the patient.Marland Kitchen Appears in no distress. Vitals Time Taken: 9:26 AM, Height: 68 in, Weight: 235 lbs, BMI: 35.7, Temperature: 98.4 F, Pulse: 69 bpm, Respiratory Rate: 18 breaths/min, Blood Pressure: 112/72 mmHg. General Notes: Wound exam; deep wounds on the fifth metatarsal heads still requiring debridement of very adherent necrotic debris although we are able to finally get this surface on both of these wounds to  something that looks like it would support granulation. I also debrided necrotic surface off the left lateral leg. The area on the right heel Achilles area looks like it is close to closing. Fortunately we do not have a vascular issue is peripheral pulses are palpable. No evidence of surrounding infection Integumentary (Hair, Skin) Wound #1 status is Open. Original cause of wound was Gradually Appeared. The wound is located on the Right Metatarsal head fifth. The wound measures 3cm length x 1.1cm width x 0.5cm depth; 2.592cm^2 area and 1.296cm^3 volume. There is tendon and Fat Layer (Subcutaneous Tissue) exposed. There is no tunneling or undermining noted. There is a medium amount of serosanguineous drainage noted. The wound margin is distinct with the outline attached to the wound base. There is large (67-100%) red, pink granulation within the wound bed. There is a small (1-33%) amount of necrotic tissue within the wound bed including Adherent Slough. Wound #2 status is Open. Original cause of wound was Gradually Appeared. The wound is located on the Right Calcaneus. The wound measures 0.3cm length x 0.4cm width x 0.1cm depth; 0.094cm^2 area and 0.009cm^3 volume. There is Fat Layer (Subcutaneous Tissue) exposed. There is no tunneling or undermining noted. There is a small amount of serosanguineous drainage noted. The wound margin is flat and intact. There is large (67-100%) red granulation within the wound bed. There is no necrotic tissue within the wound bed. Wound #3 status is Open. Original cause of wound was Gradually Appeared. The wound is located on the Left Metatarsal head fifth. The wound measures 3.7cm length x 2.7cm width x 0.9cm depth; 7.846cm^2 area and 7.062cm^3 volume. There is muscle, tendon, and Fat Layer (Subcutaneous Tissue) exposed. There is no tunneling or undermining noted. There is a medium amount of serosanguineous drainage noted. The wound margin is distinct with the  outline attached to the wound base. There is medium (34-66%) red granulation within the wound bed. There is a medium (34-66%) amount of necrotic tissue within the wound bed including Adherent Slough. Wound #4 status is Open. Original cause of wound was Pressure  Injury. The wound is located on the Left,Lateral Lower Leg. The wound measures 2cm length x 1.3cm width x 0.3cm depth; 2.042cm^2 area and 0.613cm^3 volume. There is Fat Layer (Subcutaneous Tissue) exposed. There is no tunneling or undermining noted. There is a small amount of serosanguineous drainage noted. The wound margin is distinct with the outline attached to the wound base. There is large (67- 100%) red granulation within the wound bed. There is a small (1-33%) amount of necrotic tissue within the wound bed including Adherent Slough. 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 There was a Excisional Skin/Subcutaneous Tissue Debridement with a total area of 3.3 sq cm performed by Ricard Dillon., MD. With the following instrument(s): Curette to remove Viable and Non-Viable tissue/material. Material removed includes Subcutaneous Tissue, Slough, Skin: Dermis, and Fibrin/Exudate after achieving pain control using Lidocaine 4% T opical Solution. A time out was conducted at 09:35, prior to the start of the procedure. A Moderate amount of bleeding was controlled with Pressure. The procedure was tolerated well with a pain level of 0 throughout and a pain  level of 0 following the procedure. Post Debridement Measurements: 3cm length x 1.1cm width x 0.5cm depth; 1.296cm^3 volume. Post debridement Stage noted as Category/Stage IV. Character of Wound/Ulcer Post Debridement is improved. Post procedure Diagnosis Wound #1: Same as Pre-Procedure Pre-procedure diagnosis of Wound #1 is a Pressure Ulcer located on the Right Metatarsal head fifth . There was a Four Layer Compression Therapy Procedure by Baruch Gouty, RN. Post procedure Diagnosis Wound #1: Same as Pre-Procedure Wound #3 Pre-procedure diagnosis of Wound #3 is a Pressure Ulcer located on the Left Metatarsal head fifth . There was a Excisional Skin/Subcutaneous Tissue Debridement with a total area of 9.99 sq cm performed by Ricard Dillon., MD. With the following instrument(s): Curette to remove Viable and Non-Viable tissue/material. Material removed includes Subcutaneous Tissue, Slough, Skin: Dermis, and Fibrin/Exudate after achieving pain control using Lidocaine 4% T opical Solution. A time out was conducted at 09:35, prior to the start of the procedure. A Moderate amount of bleeding was controlled with Pressure. The procedure was tolerated well with a pain level of 0 throughout and a pain level of 0 following the procedure. Post Debridement Measurements: 3.7cm length x 2.7cm width x 0.9cm depth; 7.062cm^3 volume. Post debridement Stage noted as Category/Stage IV. Character of Wound/Ulcer Post Debridement is improved. Post procedure Diagnosis Wound #3: Same as Pre-Procedure Pre-procedure diagnosis of Wound #3 is a Pressure Ulcer located on the Left Metatarsal head fifth . There was a Four Layer Compression Therapy Procedure by Baruch Gouty, RN. Post procedure Diagnosis Wound #3: Same as Pre-Procedure Wound #4 Pre-procedure diagnosis of Wound #4 is a Pressure Ulcer located on the Left,Lateral Lower Leg . There was a Excisional Skin/Subcutaneous Tissue Debridement with a total area of  2.6 sq cm performed by Ricard Dillon., MD. With the following instrument(s): Curette to remove Viable and Non-Viable tissue/material. Material removed includes Eschar, Subcutaneous Tissue, Slough, Skin: Dermis, and Fibrin/Exudate after achieving pain control using Lidocaine 4% T opical Solution.  A time out was conducted at 09:35, prior to the start of the procedure. A Moderate amount of bleeding was controlled with Pressure. The procedure was tolerated well with a pain level of 0 throughout and a pain level of 0 following the procedure. Post Debridement Measurements: 2cm length x 1.3cm width x 0.3cm depth; 0.613cm^3 volume. Post debridement Stage noted as Category/Stage III. Character of Wound/Ulcer Post Debridement is improved. Post procedure Diagnosis Wound #4: Same as Pre-Procedure Pre-procedure diagnosis of Wound #4 is a Pressure Ulcer located on the Left,Lateral Lower Leg . There was a Four Layer Compression Therapy Procedure by Baruch Gouty, RN. Post procedure Diagnosis Wound #4: Same as Pre-Procedure Wound #2 Pre-procedure diagnosis of Wound #2 is a Pressure Ulcer located on the Right Calcaneus . There was a Four Layer Compression Therapy Procedure by Baruch Gouty, RN. Post procedure Diagnosis Wound #2: Same as Pre-Procedure Plan Follow-up Appointments: Return Appointment in 1 week. Dressing Change Frequency: Do not change entire dressing for one week. - all wounds Skin Barriers/Peri-Wound Care: Moisturizing lotion - both legs. Wound Cleansing: May shower with protection. - Dressing cannot get wet. Use cast protector if taking a shower Primary Wound Dressing: Wound #1 Right Metatarsal head fifth: Silver Collagen - moisten with hydrogel. Wound #2 Right Calcaneus: Silver Collagen - moisten with hydrogel. Wound #3 Left Metatarsal head fifth: Silver Collagen - moisten with hydrogel. Wound #4 Left,Lateral Lower Leg: Silver Collagen - moisten with hydrogel. Secondary  Dressing: Wound #1 Right Metatarsal head fifth: Dry Gauze Wound #2 Right Calcaneus: Dry Gauze Heel Cup Wound #3 Left Metatarsal head fifth: Dry Gauze Wound #4 Left,Lateral Lower Leg: Dry Gauze ABD pad - felt both metatarsal heads to offer additional offloading Edema Control: 4 layer compression - Bilateral Elevate legs to the level of the heart or above for 30 minutes daily and/or when sitting, a frequency of: - throughout the day Off-Loading: Other: - avoid pressure to sites. 1. I have moved to silver collagen hydrogel to all of these wound areas under compression 2. No options for advanced treatment because of lack of insurance. 3. I really believe that they are rigorously offloading this. 4. I explained to the patient and his wife that the goal is changed here now to progression of the granulation measured by predominantly depth of the wounds Electronic Signature(s) Signed: 11/25/2019 5:33:07 PM By: Linton Ham MD Entered By: Linton Ham on 11/25/2019 09:53:05 -------------------------------------------------------------------------------- SuperBill Details Patient Name: Date of Service: [name], [name] 11/25/2019 Medical Record Number: [id number] Patient Account Number: [id number] Date of Birth/Sex: Treating RN: [birth year] (54 y.o. 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, complete Facility Procedures CPT4 Code: [id number] Description: 11042 - DEB SUBQ TISSUE 20 SQ CM/< ICD-10 Diagnosis Description L97.528 Non-pressure chronic ulcer of other part of left foot with other specified seve L97.518 Non-pressure chronic ulcer of other part of right foot with other specified sev  L97.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 Modifier: rity erity Quantity: 1  Physician Procedures : CPT4 Code Description Modifier [id number] - WC PHYS SUBQ TISS 20 SQ CM ICD-10 Diagnosis Description L97.528 Non-pressure chronic ulcer of other part of left foot with other specified severity L97.518 Non-pressure chronic ulcer of other part of right  foot with other specified severity L97.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

## 2019-11-25 NOTE — Progress Notes (Signed)
[name], [name] ([id number]) Visit Report for 11/25/2019 Arrival Information Details Patient Name: Date of Service: [name], [place] 11/25/2019 8:45 A M Medical Record Number: [id number] Patient Account Number: [id number] Date of Birth/Sex: Treating RN: 1966-02-08 (53 y.o. [name]) Yevonne Pax Primary Care Shawnae Leiva: [name] Other Clinician: Referring Deanndra Kirley: Treating Cephas Revard/Extender: [name] in Treatment: 7 Visit Information History Since Last Visit All ordered tests and consults were completed: No Patient Arrived: Wheel Chair Added or deleted any medications: No Arrival Time: 09:25 Any new allergies or adverse reactions: No Accompanied By: mother Had a fall or experienced change in No Transfer Assistance: None activities of daily living that may affect Patient Identification Verified: Yes risk of falls: Secondary Verification Process Completed: Yes Signs or symptoms of abuse/neglect since last visito No Patient Requires Transmission-Based Precautions: No Hospitalized since last visit: No Patient Has Alerts: Yes Implantable device outside of the clinic excluding No Patient Alerts: Translator Required cellular tissue based products placed in the center right ABI 1.61 since last visit: Left ABI 1.36 Has Dressing in Place as Prescribed: Yes Has Compression in Place as Prescribed: Yes Pain Present Now: No Electronic Signature([name]: 11/25/2019 5:51:40 PM By: Yevonne Pax RN Entered By: Yevonne Pax on 11/25/2019 09:26:01 -------------------------------------------------------------------------------- Compression Therapy Details Patient Name: Date of Service: [name] RLO [name] 11/25/2019 8:45 A M Medical Record Number: [id number] Patient Account Number: [id number] Date of Birth/Sex: Treating RN: 1965/03/18 (54 y.o. [name] Primary Care Maylani Embree: [name] Other Clinician: Referring Glendon Dunwoody: Treating  Kobie Whidby/Extender: [name] in Treatment: 7 Compression Therapy Performed for Wound Assessment: Wound #1 Right Metatarsal head fifth Performed By: Clinician Zenaida Deed, RN Compression Type: Four Layer Post Procedure Diagnosis Same as Pre-procedure Electronic Signature([name]: 11/25/2019 5:51:00 PM By: Shawn Stall Entered By: Shawn Stall on 11/25/2019 09:43:55 -------------------------------------------------------------------------------- Compression Therapy Details Patient Name: Date of Service: [name] 11/25/2019 8:45 A M Medical Record Number: [id number] Patient Account Number: [id number] Date of Birth/Sex: Treating RN: 1965/05/20 (54 y.o. [name] Primary Care Maylea Soria: [name] Other Clinician: Referring Kashif Pooler: Treating Shiryl Ruddy/Extender: [name] in Treatment: 7 Compression Therapy Performed for Wound Assessment: Wound #4 Left,Lateral Lower Leg Performed By: Clinician Zenaida Deed, RN Compression Type: Four Layer Post Procedure Diagnosis Same as Pre-procedure Electronic Signature([name]: 11/25/2019 5:51:00 PM By: Shawn Stall Entered By: Shawn Stall on 11/25/2019 09:43:55 -------------------------------------------------------------------------------- Compression Therapy Details Patient Name: Date of Service: [name] 11/25/2019 8:45 A M Medical Record Number: [id number] Patient Account Number: [id number] Date of Birth/Sex: Treating RN: [birth year] (54 y.o. [name] Primary Care Javion Holmer: [name] Other Clinician: Referring Benjie Ricketson: Treating Meekah Math/Extender: [name] in Treatment: 7 Compression Therapy Performed for Wound Assessment: Wound #3 Left Metatarsal head fifth Performed By: Clinician Zenaida Deed, RN Compression Type: Four Layer Post Procedure Diagnosis Same as  Pre-procedure Electronic Signature([name]: 11/25/2019 5:51:00 PM By: Shawn Stall Entered By: Shawn Stall on 11/25/2019 09:43:55 -------------------------------------------------------------------------------- Compression Therapy Details Patient Name: Date of Service: [name] 11/25/2019 8:45 A M Medical Record Number: [id number] Patient Account Number: [id number] Date of Birth/Sex: Treating RN: [birth year] (54 y.o. [name] Primary Care Mysha Peeler: [name] Other Clinician: Referring Lorena Clearman: Treating Dyllen Menning/Extender: [name] in Treatment: 7 Compression Therapy Performed for Wound Assessment: Wound #2 Right Calcaneus Performed By: Clinician Zenaida Deed, RN Compression Type: Four Layer Post Procedure Diagnosis Same as Pre-procedure Electronic  Signature([name]: 11/25/2019 5:51:00 PM By: Shawn Stall Entered By: Shawn Stall on 11/25/2019 09:43:55 -------------------------------------------------------------------------------- Encounter Discharge Information Details Patient Name: Date of Service: [name] 11/25/2019 8:45 A M Medical Record Number: [id number] Patient Account Number: [id number] Date of Birth/Sex: Treating RN: Dec 13, 1965 (54 y.o. [name] Primary Care Shewanda Sharpe: [name] Other Clinician: Referring Luigi Stuckey: Treating Jaiana Sheffer/Extender: [name] in Treatment: 7 Encounter Discharge Information Items Post Procedure Vitals Discharge Condition: Stable Temperature (F): 98.4 Ambulatory Status: Wheelchair Pulse (bpm): 69 Discharge Destination: Home Respiratory Rate (breaths/min): 18 Transportation: Private Auto Blood Pressure (mmHg): 112/72 Accompanied By: spouse Schedule Follow-up Appointment: Yes Clinical Summary of Care: Patient Declined Electronic Signature([name]: 11/25/2019 5:50:35 PM By: Zenaida Deed RN, BSN Entered  By: Zenaida Deed on 11/25/2019 10:30:34 -------------------------------------------------------------------------------- Lower Extremity Assessment Details Patient Name: Date of Service: [name] 11/25/2019 8:45 A M Medical Record Number: [id number] Patient Account Number: [id number] Date of Birth/Sex: Treating RN: 27-May-1965 (53 y.o. [name] Primary Care Tocarra Gassen: [name] Other Clinician: Referring Dajia Gunnels: Treating Yevette Knust/Extender: [name] in Treatment: 7 Edema Assessment Assessed: [Left: No] [Right: No] Edema: [Left: Yes] [Right: Yes] Calf Left: Right: Point of Measurement: From Medial Instep 44 cm 40 cm Ankle Left: Right: Point of Measurement: From Medial Instep 27 cm 25.7 cm Electronic Signature([name]: 11/25/2019 5:51:40 PM By: Yevonne Pax RN Entered By: Yevonne Pax on 11/25/2019 09:30:33 -------------------------------------------------------------------------------- Multi Wound Chart Details Patient Name: Date of Service: [name] 11/25/2019 8:45 A M Medical Record Number: [id number] Patient Account Number: [id number] Date of Birth/Sex: Treating RN: 1965/06/07 (54 y.o. [name], Millard.Loa Primary Care Courage Biglow: [name] Other Clinician: Referring Mendi Constable: Treating Martavia Tye/Extender: [name] in Treatment: 7 Vital Signs Height(in): 68 Pulse(bpm): 69 Weight(lbs): 235 Blood Pressure(mmHg): 112/72 Body Mass Index(BMI): 36 Temperature(F): 98.4 Respiratory Rate(breaths/min): 18 Photos: [1:No Photos Right Metatarsal head fifth] [2:No Photos Right Calcaneus] [3:No Photos Left Metatarsal head fifth] Wound Location: [1:Gradually Appeared] [2:Gradually Appeared] [3:Gradually Appeared] Wounding Event: [1:Pressure Ulcer] [2:Pressure Ulcer] [3:Pressure Ulcer] Primary Etiology: [1:Hypotension, Type II Diabetes] [2:Hypotension, Type II Diabetes]  [3:Hypotension, Type II Diabetes] Comorbid History: [1:08/12/2019] [2:08/12/2019] [3:08/12/2019] Date Acquired: [1:7] [2:7] [3:7] Weeks of Treatment: [1:Open] [2:Open] [3:Open] Wound Status: [1:3x1.1x0.5] [2:0.3x0.4x0.1] [3:3.7x2.7x0.9] Measurements L x W x D (cm) [1:2.592] [2:0.094] [3:7.846] A (cm) : rea [1:1.296] [2:0.009] [3:7.062] Volume (cm) : [1:39.30%] [2:94.00%] [3:-38.70%] % Reduction in A rea: [1:-203.50%] [2:97.10%] [3:-1149.90%] % Reduction in Volume: [1:Category/Stage IV] [2:Category/Stage III] [3:Category/Stage IV] Classification: [1:Medium] [2:Small] [3:Medium] Exudate A mount: [1:Serosanguineous] [2:Serosanguineous] [3:Serosanguineous] Exudate Type: [1:red, brown] [2:red, brown] [3:red, brown] Exudate Color: [1:Distinct, outline attached] [2:Flat and Intact] [3:Distinct, outline attached] Wound Margin: [1:Large (67-100%)] [2:Large (67-100%)] [3:Medium (34-66%)] Granulation A mount: [1:Red, Pink] [2:Red] [3:Red] Granulation Quality: [1:Small (1-33%)] [2:None Present (0%)] [3:Medium (34-66%)] Necrotic A mount: [1:Fat Layer (Subcutaneous Tissue): Yes Fat Layer (Subcutaneous Tissue): Yes Fat Layer (Subcutaneous Tissue): Yes] Exposed Structures: [1:Tendon: Yes Fascia: No Muscle: No Joint: No Bone: No Small (1-33%)] [2:Fascia: No Tendon: No Muscle: No Joint: No Bone: No Small (1-33%)] [3:Tendon: Yes Muscle: Yes Fascia: No Joint: No Bone: No None] Epithelialization: [1:Debridement - Excisional] [2:N/A] [3:Debridement - Excisional] Debridement: Pre-procedure Verification/Time Out 09:35 [2:N/A] [3:09:35] Taken: [1:Lidocaine 4% Topical Solution] [2:N/A] [3:Lidocaine 4% Topical Solution] Pain Control: [1:Subcutaneous, Slough] [2:N/A] [3:Subcutaneous, Slough] Tissue Debrided: [1:Skin/Subcutaneous Tissue] [2:N/A] [3:Skin/Subcutaneous Tissue] Level: [1:3.3] [2:N/A] [3:9.99] Debridement A (sq cm): [1:rea Curette] [2:N/A] [3:Curette] Instrument: [1:Moderate] [2:N/A] [3:Moderate] Bleeding:  [  1:Pressure] [2:N/A] [3:Pressure] Hemostasis A chieved: [1:0] [2:N/A] [3:0] Procedural Pain: [1:0] [2:N/A] [3:0] Post Procedural Pain: [1:Procedure was tolerated well] [2:N/A] [3:Procedure was tolerated well] Debridement Treatment Response: [1:3x1.1x0.5] [2:N/A] [3:3.7x2.7x0.9] Post Debridement Measurements L x W x D (cm) [1:1.296] [2:N/A] [3:7.062] Post Debridement Volume: (cm) [1:Category/Stage IV] [2:N/A] [3:Category/Stage IV] Post Debridement Stage: [1:Compression Therapy] [2:Compression Therapy] [3:Compression Therapy] Procedures Performed: [1:Debridement] [2:4 N/A] [3:Debridement N/A] Photos: [1:No Photos Left, Lateral Lower Leg] [2:N/A N/A] [3:N/A N/A] Wound Location: [1:Pressure Injury] [2:N/A] [3:N/A] Wounding Event: [1:Pressure Ulcer] [2:N/A] [3:N/A] Primary Etiology: [1:Hypotension, Type II Diabetes] [2:N/A] [3:N/A] Comorbid History: [1:08/12/2019] [2:N/A] [3:N/A] Date Acquired: [1:7] [2:N/A] [3:N/A] Weeks of Treatment: [1:Open] [2:N/A] [3:N/A] Wound Status: [1:2x1.3x0.3] [2:N/A] [3:N/A] Measurements L x W x D (cm) [1:2.042] [2:N/A] [3:N/A] A (cm) : rea [1:0.613] [2:N/A] [3:N/A] Volume (cm) : [1:30.70%] [2:N/A] [3:N/A] % Reduction in A rea: [1:-107.80%] [2:N/A] [3:N/A] % Reduction in Volume: [1:Category/Stage III] [2:N/A] [3:N/A] Classification: [1:Small] [2:N/A] [3:N/A] Exudate A mount: [1:Serosanguineous] [2:N/A] [3:N/A] Exudate Type: [1:red, brown] [2:N/A] [3:N/A] Exudate Color: [1:Distinct, outline attached] [2:N/A] [3:N/A] Wound Margin: [1:Large (67-100%)] [2:N/A] [3:N/A] Granulation A mount: [1:Red] [2:N/A] [3:N/A] Granulation Quality: [1:Small (1-33%)] [2:N/A] [3:N/A] Necrotic A mount: [1:Fat Layer (Subcutaneous Tissue): Yes N/A] [3:N/A] Exposed Structures: [1:Fascia: No Tendon: No Muscle: No Joint: No Bone: No Small (1-33%)] [2:N/A] [3:N/A] Epithelialization: [1:Debridement - Excisional] [2:N/A] [3:N/A] Debridement: Pre-procedure Verification/Time Out 09:35  [2:N/A] [3:N/A] Taken: [1:Lidocaine 4% Topical Solution] [2:N/A] [3:N/A] Pain Control: [1:Necrotic/Eschar, Subcutaneous,] [2:N/A] [3:N/A] Tissue Debrided: [1:Slough Skin/Subcutaneous Tissue] [2:N/A] [3:N/A] Level: [1:2.6] [2:N/A] [3:N/A] Debridement A (sq cm): [1:rea Curette] [2:N/A] [3:N/A] Instrument: [1:Moderate] [2:N/A] [3:N/A] Bleeding: [1:Pressure] [2:N/A] [3:N/A] Hemostasis Achieved: [1:0] [2:N/A] [3:N/A] Procedural Pain: [1:0] [2:N/A] [3:N/A] Post Procedural Pain: Debridement Treatment Response: Procedure was tolerated well [2:N/A] [3:N/A] Post Debridement Measurements L x 2x1.3x0.3 [2:N/A] [3:N/A] W x D (cm) [1:0.613] [2:N/A] [3:N/A] Post Debridement Volume: (cm) [1:Category/Stage III] [2:N/A] [3:N/A] Post Debridement Stage: [1:Compression Therapy] [2:N/A] [3:N/A] Procedures Performed: [1:Debridement] Treatment Notes Electronic Signature([name]: 11/25/2019 5:33:07 PM By: Baltazar Najjar MD Signed: 11/25/2019 5:51:00 PM By: Shawn Stall Entered By: Baltazar Najjar on 11/25/2019 09:48:15 -------------------------------------------------------------------------------- Multi-Disciplinary Care Plan Details Patient Name: Date of Service: [name] 11/25/2019 8:45 A M Medical Record Number: [id number] Patient Account Number: [id number] Date of Birth/Sex: Treating RN: Oct 11, 1965 (54 y.o. [name] Primary Care Ralston Venus: [name] Other Clinician: Referring Tarisa Paola: Treating Davis Vannatter/Extender: [name] in Treatment: 7 Active Inactive Nutrition Nursing Diagnoses: Impaired glucose control: actual or potential Goals: Patient/caregiver verbalizes understanding of need to maintain therapeutic glucose control per primary care physician Date Initiated: 10/01/2019 Target Resolution Date: 12/03/2019 Goal Status: Active Interventions: Provide education on elevated blood sugars and impact on wound  healing Notes: Pressure Nursing Diagnoses: Knowledge deficit related to causes and risk factors for pressure ulcer development Goals: Patient/caregiver will verbalize risk factors for pressure ulcer development Date Initiated: 10/01/2019 Target Resolution Date: 12/03/2019 Goal Status: Active Interventions: Provide education on pressure ulcers Notes: Wound/Skin Impairment Nursing Diagnoses: Impaired tissue integrity Goals: Ulcer/skin breakdown will have a volume reduction of 50% by week 8 Date Initiated: 10/01/2019 Target Resolution Date: 12/03/2019 Goal Status: Active Interventions: Provide education on ulcer and skin care Notes: Electronic Signature([name]: 11/25/2019 5:51:00 PM By: Shawn Stall Entered By: Shawn Stall on 11/25/2019 09:05:57 -------------------------------------------------------------------------------- Pain Assessment Details Patient Name: Date of Service: [name] 11/25/2019 8:45 A M Medical Record Number: [id number] Patient Account Number: [id number] Date of Birth/Sex: Treating RN: Nov 06, 1965 (54 y.o.  [name] Primary Care Kaci Freel: [name] Other Clinician: Referring Jace Fermin: Treating Amila Callies/Extender: [name] in Treatment: 7 Active Problems Location of Pain Severity and Description of Pain Patient Has Paino No Site Locations Pain Management and Medication Current Pain Management: Electronic Signature([name]: 11/25/2019 5:51:40 PM By: Yevonne Pax RN Entered By: Yevonne Pax on 11/25/2019 09:27:53 -------------------------------------------------------------------------------- Patient/Caregiver Education Details Patient Name: Date of Service: [name] RLO [name] 10/14/2021andnbsp8:45 A M Medical Record Number: [id number] Patient Account Number: [id number] Date of Birth/Gender: Treating RN: 07-26-1965 (54 y.o. [name] Primary Care Physician: [name] Other Clinician: Referring Physician: Treating Physician/Extender: [name] in Treatment: 7 Education Assessment Education Provided To: Patient Education Topics Provided Elevated Blood Sugar/ Impact on Healing: Handouts: Elevated Blood Sugars: How Do They Affect Wound Healing Methods: Explain/Verbal Responses: Reinforcements needed Electronic Signature([name]: 11/25/2019 5:51:00 PM By: Shawn Stall Entered By: Shawn Stall on 11/25/2019 09:06:10 -------------------------------------------------------------------------------- Wound Assessment Details Patient Name: Date of Service: [name], Sherlyn Lees 11/25/2019 8:45 A M Medical Record Number: [id number] Patient Account Number: [id number] Date of Birth/Sex: Treating RN: 06-18-1965 (53 y.o. [name]) Yevonne Pax Primary Care Pratt Bress: [name] Other Clinician: Referring Brandan Glauber: Treating Charle Mclaurin/Extender: [name] in Treatment: 7 Wound Status Wound Number: 1 Primary Etiology: Pressure Ulcer Wound Location: Right Metatarsal head fifth Wound Status: Open Wounding Event: Gradually Appeared Comorbid History: Hypotension, Type II Diabetes Date Acquired: 08/12/2019 Weeks Of Treatment: 7 Clustered Wound: No Wound Measurements Length: (cm) 3 Width: (cm) 1.1 Depth: (cm) 0.5 Area: (cm) 2.592 Volume: (cm) 1.296 % Reduction in Area: 39.3% % Reduction in Volume: -203.5% Epithelialization: Small (1-33%) Tunneling: No Undermining: No Wound Description Classification: Category/Stage IV Wound Margin: Distinct, outline attached Exudate Amount: Medium Exudate Type: Serosanguineous Exudate Color: red, brown Foul Odor After Cleansing: No Slough/Fibrino Yes Wound Bed Granulation Amount: Large (67-100%) Exposed Structure Granulation Quality: Red, Pink Fascia Exposed: No Necrotic Amount: Small (1-33%) Fat Layer (Subcutaneous Tissue) Exposed:  Yes Necrotic Quality: Adherent Slough Tendon Exposed: Yes Muscle Exposed: No Joint Exposed: No Bone Exposed: No Treatment Notes Wound #1 (Right Metatarsal head fifth) 2. Periwound Care Moisturizing lotion 3. Primary Dressing Applied Collegen AG 4. Secondary Dressing ABD Pad Dry Gauze 6. Support Layer Applied 4 layer compression wrap Electronic Signature([name]: 11/25/2019 5:51:40 PM By: Yevonne Pax RN Entered By: Yevonne Pax on 11/25/2019 09:31:58 -------------------------------------------------------------------------------- Wound Assessment Details Patient Name: Date of Service: [name], Sherlyn Lees 11/25/2019 8:45 A M Medical Record Number: [id number] Patient Account Number: [id number] Date of Birth/Sex: Treating RN: September 14, 1965 (53 y.o. [name]) Yevonne Pax Primary Care Nyari Olsson: [name] Other Clinician: Referring Elisea Khader: Treating Ragan Duhon/Extender: [name] in Treatment: 7 Wound Status Wound Number: 2 Primary Etiology: Pressure Ulcer Wound Location: Right Calcaneus Wound Status: Open Wounding Event: Gradually Appeared Comorbid History: Hypotension, Type II Diabetes Date Acquired: 08/12/2019 Weeks Of Treatment: 7 Clustered Wound: No Wound Measurements Length: (cm) 0.3 Width: (cm) 0.4 Depth: (cm) 0.1 Area: (cm) 0.094 Volume: (cm) 0.009 % Reduction in Area: 94% % Reduction in Volume: 97.1% Epithelialization: Small (1-33%) Tunneling: No Undermining: No Wound Description Classification: Category/Stage III Wound Margin: Flat and Intact Exudate Amount: Small Exudate Type: Serosanguineous Exudate Color: red, brown Foul Odor After Cleansing: No Slough/Fibrino Yes Wound Bed Granulation Amount: Large (67-100%) Exposed Structure Granulation Quality: Red Fascia Exposed: No Necrotic Amount: None Present (0%) Fat Layer (Subcutaneous Tissue) Exposed: Yes Tendon Exposed: No Muscle Exposed: No Joint Exposed: No Bone  Exposed: No Treatment Notes Wound #2 (Right Calcaneus) 2. Periwound Care Moisturizing lotion 3. Primary Dressing Applied Collegen AG 4. Secondary Dressing ABD Pad Dry Gauze 6. Support Layer Applied 4 layer compression Cytogeneticistwrap Electronic Signature([name]: 11/25/2019 5:51:40 PM By: Yevonne PaxEpps, Carrie RN Entered By: Yevonne PaxEpps, Carrie on 11/25/2019 09:32:09 -------------------------------------------------------------------------------- Wound Assessment Details Patient Name: Date of Service: Harry MoroFA CUNDO, Sherlyn LeesCA RLO [name] 11/25/2019 8:45 A M Medical Record Number: [id number] Patient Account Number: [id number] Date of Birth/Sex: Treating RN: 1965-04-11 (53 y.o. Judie PetitM) Yevonne PaxEpps, Carrie Primary Care Brittay Mogle: Gwinda PasseEdwards, Michelle Other Clinician: Referring Azuri Bozard: Treating Shella Lahman/Extender: Aurelio Brashobson, Michael Edwards, Michelle Weeks in Treatment: 7 Wound Status Wound Number: 3 Primary Etiology: Pressure Ulcer Wound Location: Left Metatarsal head fifth Wound Status: Open Wounding Event: Gradually Appeared Comorbid History: Hypotension, Type II Diabetes Date Acquired: 08/12/2019 Weeks Of Treatment: 7 Clustered Wound: No Wound Measurements Length: (cm) 3.7 Width: (cm) 2.7 Depth: (cm) 0.9 Area: (cm) 7.846 Volume: (cm) 7.062 % Reduction in Area: -38.7% % Reduction in Volume: -1149.9% Epithelialization: None Tunneling: No Undermining: No Wound Description Classification: Category/Stage IV Wound Margin: Distinct, outline attached Exudate Amount: Medium Exudate Type: Serosanguineous Exudate Color: red, brown Foul Odor After Cleansing: No Slough/Fibrino Yes Wound Bed Granulation Amount: Medium (34-66%) Exposed Structure Granulation Quality: Red Fascia Exposed: No Necrotic Amount: Medium (34-66%) Fat Layer (Subcutaneous Tissue) Exposed: Yes Necrotic Quality: Adherent Slough Tendon Exposed: Yes Muscle Exposed: Yes Necrosis of Muscle: No Joint Exposed: No Bone Exposed: No Treatment Notes Wound #3  (Left Metatarsal head fifth) 2. Periwound Care Moisturizing lotion 3. Primary Dressing Applied Collegen AG 4. Secondary Dressing ABD Pad Dry Gauze 6. Support Layer Applied 4 layer compression Cytogeneticistwrap Electronic Signature([name]: 11/25/2019 5:51:40 PM By: Yevonne PaxEpps, Carrie RN Entered By: Yevonne PaxEpps, Carrie on 11/25/2019 09:34:21 -------------------------------------------------------------------------------- Wound Assessment Details Patient Name: Date of Service: Harry MoroFA CUNDO, Sherlyn LeesCA RLO [name] 11/25/2019 8:45 A M Medical Record Number: [id number] Patient Account Number: [id number] Date of Birth/Sex: Treating RN: 1965-04-11 (53 y.o. Judie PetitM) Yevonne PaxEpps, Carrie Primary Care Asmar Brozek: Gwinda PasseEdwards, Michelle Other Clinician: Referring [place]: Treating Sharise Lippy/Extender: Aurelio Brashobson, Michael Edwards, Michelle Weeks in Treatment: 7 Wound Status Wound Number: 4 Primary Etiology: Pressure Ulcer Wound Location: Left, Lateral Lower Leg Wound Status: Open Wounding Event: Pressure Injury Comorbid History: Hypotension, Type II Diabetes Date Acquired: 08/12/2019 Weeks Of Treatment: 7 Clustered Wound: No Wound Measurements Length: (cm) 2 Width: (cm) 1.3 Depth: (cm) 0.3 Area: (cm) 2.042 Volume: (cm) 0.613 Wound Description Classification: Category/Stage III Wound Margin: Distinct, outline attached Exudate Amount: Small Exudate Type: Serosanguineous Exudate Color: red, brown Foul Odor After Cleansing: Slough/Fibrino % Reduction in Area: 30.7% % Reduction in Volume: -107.8% Epithelialization: Small (1-33%) Tunneling: No Undermining: No No Yes Wound Bed Granulation Amount: Large (67-100%) Exposed Structure Granulation Quality: Red Fascia Exposed: No Necrotic Amount: Small (1-33%) Fat Layer (Subcutaneous Tissue) Exposed: Yes Necrotic Quality: Adherent Slough Tendon Exposed: No Muscle Exposed: No Joint Exposed: No Bone Exposed: No Treatment Notes Wound #4 (Left, Lateral Lower Leg) 2. Periwound  Care Moisturizing lotion 3. Primary Dressing Applied Collegen AG 4. Secondary Dressing ABD Pad Dry Gauze 6. Support Layer Applied 4 layer compression wrap Electronic Signature([name]: 11/25/2019 5:51:40 PM By: Yevonne PaxEpps, Carrie RN Entered By: Yevonne PaxEpps, Carrie on 11/25/2019 09:34:22 -------------------------------------------------------------------------------- Vitals Details Patient Name: Date of Service: Harry MoroFA CUNDO, [place] RLO [name] 11/25/2019 8:45 A M Medical Record Number: [id number] Patient Account Number: [id number] Date of Birth/Sex: Treating RN: 1965-04-11 (53 y.o. Harry FloridaM) Epps, Carrie Primary Care Sherika Kubicki: Gwinda PasseEdwards, Michelle Other Clinician: Referring Vinaya Sancho: Treating Breawna Montenegro/Extender: Aurelio Brashobson, Michael Edwards, Michelle Weeks  in Treatment: 7 Vital Signs Time Taken: 09:26 Temperature (F): 98.4 Height (in): 68 Pulse (bpm): 69 Weight (lbs): 235 Respiratory Rate (breaths/min): 18 Body Mass Index (BMI): 35.7 Blood Pressure (mmHg): 112/72 Reference Range: 80 - 120 mg / dl Electronic Signature([name]: 11/25/2019 5:51:40 PM By: Yevonne Pax RN Entered By: Yevonne Pax on 11/25/2019 09:27:33

## 2019-11-26 ENCOUNTER — Encounter: Payer: Self-pay | Admitting: Physical Medicine and Rehabilitation

## 2019-11-26 ENCOUNTER — Other Ambulatory Visit: Payer: Self-pay

## 2019-11-26 ENCOUNTER — Encounter
Payer: Medicaid Other | Attending: Physical Medicine and Rehabilitation | Admitting: Physical Medicine and Rehabilitation

## 2019-11-26 VITALS — BP 104/68 | HR 71 | Temp 98.9°F

## 2019-11-26 DIAGNOSIS — Z9189 Other specified personal risk factors, not elsewhere classified: Secondary | ICD-10-CM | POA: Insufficient documentation

## 2019-11-26 DIAGNOSIS — M7918 Myalgia, other site: Secondary | ICD-10-CM | POA: Diagnosis present

## 2019-11-26 DIAGNOSIS — G8250 Quadriplegia, unspecified: Secondary | ICD-10-CM | POA: Diagnosis present

## 2019-11-26 DIAGNOSIS — Z993 Dependence on wheelchair: Secondary | ICD-10-CM | POA: Diagnosis present

## 2019-11-26 DIAGNOSIS — R252 Cramp and spasm: Secondary | ICD-10-CM | POA: Diagnosis present

## 2019-11-26 NOTE — Patient Instructions (Signed)
Plan: 1. Patient here for trigger point injections for  Consent done and on chart.  Cleaned areas with alcohol and injected using a 27 gauge 1.5 inch needle  Injected  6cc Using 1% Lidocaine with no EPI  Upper traps B/L Levators B/L Posterior scalenes B/L Middle scalenes Splenius Capitus B/L Pectoralis Major B/L Rhomboids B/L Infraspinatus Teres Major/minor Thoracic paraspinals- B/L Lumbar paraspinals Other injections- supraspinatus- B/L- and around cervical incision   Patient's level of pain prior was 9/10 Current level of pain after injections is 4/10  There was no bleeding or complications.  Patient was advised to drink a lot of water on day after injections to flush system Will have increased soreness for 12-48 hours after injections.  Can use Lidocaine patches the day AFTER injections Can use theracane on day of injections in places didn't inject Can use heating pad 4-6 hours AFTER injections  2. Good on refills- doesn't need any refills today.   3. No meds changes today.   4. Never had Symptoms of Autonomic dysreflexia- never had increased BP, HA. Educated that if happens, let me know ASAP.    5. Takes on the temperature of the room- use blankets and can change the temperature of the room- if need be. Can get hot or cold, depends on temperature of room, weather- so watch this carefully.    6. F/U in 6 weeks

## 2019-11-26 NOTE — Progress Notes (Signed)
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       Wounds on LE's/feet are doing better- last saw them yesterday.   Increased Baclofen at last appointment for muscle spasms- moderate help.   Restarted Gabapentin at last f/u for nerve pain- has helped.    Nothing new- just wants injections.  Lasted 5-6  weeks- just started bothering him this past week.       Plan: 1. Patient here for trigger point injections for  Consent done and on chart.  Cleaned areas with alcohol and injected using a 27 gauge 1.5 inch needle  Injected  6cc Using 1% Lidocaine with no EPI  Upper traps B/L Levators B/L Posterior scalenes B/L Middle scalenes Splenius Capitus B/L Pectoralis Major B/L Rhomboids B/L Infraspinatus Teres Major/minor Thoracic paraspinals- B/L Lumbar paraspinals Other injections- supraspinatus- B/L- and around cervical incision   Patient's level of pain prior was 9/10 Current level of pain after injections is 4/10  There was no bleeding or complications.  Patient was advised to drink a lot of water on day after injections to flush system Will have increased soreness for 12-48 hours after injections.  Can use Lidocaine patches the day AFTER injections Can use theracane on day of injections in places didn't inject Can use heating pad 4-6 hours AFTER injections  2. Good on refills- doesn't need any refills today.   3. No meds changes today.   4. Never had Symptoms of Autonomic dysreflexia- never had increased BP, HA. Educated that if happens, let me know ASAP.    5. Takes on the temperature of the room- use blankets and can change the temperature of the room- if need be. Can get hot or cold, depends on temperature of room, weather- so watch this carefully.    6. F/U in 6 weeks  I spent a total of 30 minutes on visit- as detailed above.

## 2019-12-02 ENCOUNTER — Other Ambulatory Visit: Payer: Self-pay

## 2019-12-02 ENCOUNTER — Ambulatory Visit: Payer: Medicaid Other | Admitting: Physical Therapy

## 2019-12-02 ENCOUNTER — Encounter: Payer: Self-pay | Admitting: Physical Therapy

## 2019-12-02 DIAGNOSIS — R293 Abnormal posture: Secondary | ICD-10-CM

## 2019-12-02 DIAGNOSIS — R29818 Other symptoms and signs involving the nervous system: Secondary | ICD-10-CM

## 2019-12-02 DIAGNOSIS — R208 Other disturbances of skin sensation: Secondary | ICD-10-CM

## 2019-12-02 DIAGNOSIS — M6281 Muscle weakness (generalized): Secondary | ICD-10-CM

## 2019-12-02 DIAGNOSIS — G8253 Quadriplegia, C5-C7 complete: Secondary | ICD-10-CM

## 2019-12-02 NOTE — Patient Instructions (Signed)
Access Code: [email or web address] URL: [email or web address] Date: 12/02/2019 Prepared by: Bufford Lope  Exercises Supine Hip Adductor Stretch - 1 x daily - 7 x weekly - 3 reps - 1 sets - 30 seconds hold Hip ER stretch - 2 x daily - 7 x weekly - 4 reps - 1 sets - 1 min hold Single Knee to Chest Stretch - 2 x daily - 7 x weekly - 2 sets - 1 minute hold Supine Piriformis Stretch - 1 x daily - 7 x weekly - 2 sets - 1 minute hold

## 2019-12-03 ENCOUNTER — Encounter (HOSPITAL_BASED_OUTPATIENT_CLINIC_OR_DEPARTMENT_OTHER): Payer: Medicaid Other | Admitting: Internal Medicine

## 2019-12-03 DIAGNOSIS — E11621 Type 2 diabetes mellitus with foot ulcer: Secondary | ICD-10-CM | POA: Diagnosis not present

## 2019-12-03 NOTE — Progress Notes (Signed)
Harry, [name] ([id number]) Visit Report for 12/03/2019 Debridement Details Patient Name: Date of Service: [name] 12/03/2019 2:45 PM Medical Record Number: [id number] Patient Account Number: [id number] Date of Birth/Sex: Treating RN: [birth year] (54 y.o. [name] Mention Primary Care Provider: Juluis [name] Other Clinician: Referring Provider: Treating Provider/Extender: [name] in Treatment: 9 Debridement Performed for Assessment: Wound #1 Right Metatarsal head fifth Performed By: Physician [name]., MD Debridement Type: Debridement Level of Consciousness (Pre-procedure): Awake and Alert Pre-procedure Verification/Time Out Yes - 15:20 Taken: Start Time: 15:21 T Area Debrided (L x W): otal 3 (cm) x 1.5 (cm) = 4.5 (cm) Tissue and other material debrided: Viable, Non-Viable, Slough, Subcutaneous, Slough Level: Skin/Subcutaneous Tissue Debridement Description: Excisional Instrument: Curette Bleeding: Minimum Hemostasis Achieved: Pressure End Time: 15:25 Procedural Pain: Insensate Post Procedural Pain: Insensate Response to Treatment: Procedure was tolerated well Level of Consciousness (Post- Awake and Alert procedure): Post Debridement Measurements of Total Wound Length: (cm) 3 Stage: Category/Stage IV Width: (cm) 1.5 Depth: (cm) 0.5 Volume: (cm) 1.767 Character of Wound/Ulcer Post Debridement: Requires Further Debridement Post Procedure Diagnosis Same as Pre-procedure Electronic Signature(s) Signed: 12/03/2019 4:52:36 PM By: Harry Ham MD Signed: 12/03/2019 5:21:31 PM By: Harry Gouty RN, BSN Entered By: [name] on 12/03/2019 15:30:40 -------------------------------------------------------------------------------- Debridement Details Patient Name: Date of Service: [name], [name] 12/03/2019 2:45 PM Medical Record Number: [id number] Patient Account Number: [id number] Date of Birth/Sex:  Treating RN: [birth year] (54 y.o. [name] Mention Primary Care Provider: Juluis [name] Other Clinician: Referring Provider: Treating Provider/Extender: [name] in Treatment: 9 Debridement Performed for Assessment: Wound #4 Left,Lateral Lower Leg Performed By: Physician [name]., MD Debridement Type: Debridement Level of Consciousness (Pre-procedure): Awake and Alert Pre-procedure Verification/Time Out Yes - 15:20 Taken: Start Time: 15:21 T Area Debrided (L x W): otal 2 (cm) x 1.7 (cm) = 3.4 (cm) Tissue and other material debrided: Viable, Non-Viable, Slough, Subcutaneous, Slough Level: Skin/Subcutaneous Tissue Debridement Description: Excisional Instrument: Curette Bleeding: Minimum Hemostasis Achieved: Pressure End Time: 15:25 Procedural Pain: Insensate Post Procedural Pain: Insensate Response to Treatment: Procedure was tolerated well Level of Consciousness (Post- Awake and Alert procedure): Post Debridement Measurements of Total Wound Length: (cm) 2 Stage: Category/Stage III Width: (cm) 1.7 Depth: (cm) 0.1 Volume: (cm) 0.267 Character of Wound/Ulcer Post Debridement: Improved Post Procedure Diagnosis Same as Pre-procedure Electronic Signature(s) Signed: 12/03/2019 4:52:36 PM By: Harry Ham MD Signed: 12/03/2019 5:21:31 PM By: Harry Gouty RN, BSN Entered By: [name] on 12/03/2019 15:30:49 -------------------------------------------------------------------------------- HPI Details Patient Name: Date of Service: [name], [name] 12/03/2019 2:45 PM Medical Record Number: [id number] Patient Account Number: [id number] Date of Birth/Sex: Treating RN: Feb 03, 1966 (54 y.o. [name] Mention Primary Care Provider: Juluis [name] Other Clinician: Referring Provider: Treating Provider/Extender: [name] in Treatment: 9 History of Present Illness HPI Description:  ADMISSION 10/02/2018 This is a 54 year old Spanish-speaking man who arrived accompanied by his wife. 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 Apparently 6 weeks ago his wife noted blisters on his bilateral fifth metatarsal heads and heels which she feels was from excessive pressure on the foot rests of his wheelchair. He was seen in the emergency room early in August and then was admitted to hospital from 8/10 through 8/14 with cellulitis of his feet. He was ultimately discharged on Keflex. Arterial studies were done in the hospital that showed an  ABI of 1.61 on the right and 1.36 on the left but triphasic waveforms on the left and biphasic on the right. DVT rule out study was negative. X-ray of the bilateral feet did not show osteomyelitis. They have been dealing with these wounds at home. I think they are using Betadine. He has Medicaid and does not have home health. He comes in today with wounds on his bilateral plantar fifth metatarsal heads Achilles area of both heels and an area on the left lateral leg. His wife explains this as he always laterally rotates his legs when he is sitting in the wheelchair or in bed. She even seemed to believe that before his injury he actually walked on the outside of his feet. 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 Paraplegia T1-T6 8/27; the patient has 4 open wounds mirror-image areas over the plantar fifth metatarsal heads. Area on the right Achilles area and then on the left lateral calf. We have been using Iodoflex. He is on Eliquis 9/2 debridement I did last week caused copious amounts of bleeding which was difficult to control. He is on Eliquis. He has the 2 mirror-image wounds over the plantar fifth metatarsal heads in the area on the right Achilles and then an area on the left lateral calf. Both the  fifth metatarsal head wounds on the left lateral calf of necrotic eschar on the surface. Black discoloration likely partially the silver nitrate we had to use last time 9/9; he arrives back in clinic today with the wounds on the plantar fifth metatarsal heads and exactly the same nonviable situation. He also has an area on the Achilles part of his right heel and the left lateral leg in a very similar situation. Nothing is making much progress I took some time to review his arterial studies from his hospitalization before he came to this clinic. On the right he had noncompressible ABIs with biphasic waveforms. They did not do a TBI in the right. On the left he had noncompressible ABIs at 1.36. However his TBI was 0.70 with triphasic waveforms at the dorsalis pedis he had a DVT rule out study that was negative. We have been using Iodoflex without much progress back in compression as he does not have access to wound care supplies 9/17; he comes back in with the same adherent eschar over both fifth met heads. I am really uncertain about this.. I used Iodoflex and last week switch to Sorbact. The area on the left posterior calf still with adherent debris. The right heel looked better 9/24 unfortunately his interpreter had to leave before we got in the room. Not much change. The right fifth met head is better however the left is not much improved we have been using Iodoflex 10/1; he has the mirror-image wounds on the firth metatarsal head. The area on the right looks better the area on the left still copious amounts of necrotic material which is probably subcutaneous and muscle. This is deep but I was able to get this down to a healthy surface albeit with an extensive debridement. We have been using Iodoflex. The area on the right heel looks like it is contracting. The patient has significant chronic venous insufficiency and lymphedema. I have been keeping him meaning in compression for this reason and also  this allows [place] to keep the dressings on all week. The patient does not have insurance 10/8; Bilateral 5th met head  wounds likely pressure in the setting of paraplegia. Also right posterior right heel and left lat calf. Making progress with Iodoflex 10/14; we continue to make good progress with the surface of the wounds over the fifth metatarsal heads these are deep punched out pressure ulcers in the setting of diabetes and paraplegia. Also the area on the left lateral calf. We have had continued improvement in the right heel wound. I change the primary dressing to silver collagen today I am hoping to stimulate some granulation here. We finally have the surfaces of the wounds commensurate with that goal 10/22; not much improvement in fact the area on the right fifth met head needed debridement today which it had in last week. This is more shallow and it does have rims of epithelialization. Area on the left fifth met head is about the same left lateral leg is necrotic black covering. The area on the right Achilles heel is much better Electronic Signature(s) Signed: 12/03/2019 4:52:36 PM By: Harry Ham MD Entered By: [name] on 12/03/2019 15:31:46 -------------------------------------------------------------------------------- Physical Exam Details Patient Name: Date of Service: [place] Organ RLO S 12/03/2019 2:45 PM Medical Record Number: [id number] Patient Account Number: [id number] Date of Birth/Sex: Treating RN: 1965/03/21 (54 y.o. [name] Mention Primary Care Provider: Juluis [name] Other Clinician: Referring Provider: Treating Provider/Extender: [name] in Treatment: 9 Constitutional Sitting or standing Blood Pressure is within target range for patient.. Pulse regular and within target range for patient.Marland Kitchen Respirations regular, non-labored and within target range.. Temperature is normal and within the target range for the patient.Marland Kitchen  Appears in no distress. Cardiovascular Pedal pulses are palpable. Edema is well controlled. Notes Wound exam; punched-out wounds on the right fifth met head better looking surface on the left but not the right. The right is the more shallow wound required debridement today with a #5 curette necrotic subcutaneous tissue this does however have rims of epithelialization. The 2 additional wounds on the right Achilles heel is a lot better. Not so much the area in the left leg which has necrotic surface that I have once again removed Electronic Signature(s) Signed: 12/03/2019 4:52:36 PM By: Harry Ham MD Entered By: [name] on 12/03/2019 15:34:12 -------------------------------------------------------------------------------- Physician Orders Details Patient Name: Date of Service: [place] Organ RLO S 12/03/2019 2:45 PM Medical Record Number: [id number] Patient Account Number: [id number] Date of Birth/Sex: Treating RN: 1965/05/01 (54 y.o. [name] Mention Primary Care Provider: Juluis [name] Other Clinician: Referring Provider: Treating Provider/Extender: [name] in Treatment: 9 Verbal / Phone Orders: No Diagnosis Coding ICD-10 Coding Code Description L97.518 Non-pressure chronic ulcer of other part of right foot with other specified severity L97.528 Non-pressure chronic ulcer of other part of left foot with other specified severity L89.613 Pressure ulcer of right heel, stage 3 L89.623 Pressure ulcer of left heel, stage 3 E11.621 Type 2 diabetes mellitus with foot ulcer L97.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 G82.21 Paraplegia, complete Follow-up Appointments Return Appointment in 1 week. Dressing Change Frequency Do not change entire dressing for one week. - all wounds Skin Barriers/Peri-Wound Care Moisturizing lotion - both legs. Wound Cleansing May shower with protection. - Dressing cannot get  wet. Use cast protector if taking a shower Primary Wound Dressing Wound #1 Right Metatarsal head fifth Silver Collagen - moisten with hydrogel. Wound #3 Left Metatarsal head fifth Silver Collagen - moisten with hydrogel. Wound #4 Left,Lateral Lower Leg Silver Collagen - moisten with hydrogel. Secondary Dressing  Wound #1 Right Metatarsal head fifth Dry Gauze Drawtex - cut to fit inside wound margins to hold collagen in place Wound #3 Left Metatarsal head fifth Dry Gauze Drawtex - cut to fit inside wound margins to hold collagen in place Wound #4 Left,Lateral Lower Leg Dry Gauze ABD pad - both metatarsal heads to offer additional offloading Edema Control 4 layer compression - Bilateral Elevate legs to the level of the heart or above for 30 minutes daily and/or when sitting, a frequency of: - throughout the day Off-Loading Other: - avoid pressure to feet Electronic Signature(s) Signed: 12/03/2019 4:52:36 PM By: Harry Ham MD Signed: 12/03/2019 5:21:31 PM By: Harry Gouty RN, BSN Entered By: [name] on 12/03/2019 15:30:40 -------------------------------------------------------------------------------- Problem List Details Patient Name: Date of Service: [name], [place] 12/03/2019 2:45 PM Medical Record Number: [id number] Patient Account Number: [id number] Date of Birth/Sex: Treating RN: 04/11/1965 (54 y.o. [name] Mention Primary Care Provider: Juluis [name] Other Clinician: Referring Provider: Treating Provider/Extender: [name] in Treatment: 9 Active Problems ICD-10 Encounter Code Description Active Date MDM Diagnosis L97.518 Non-pressure chronic ulcer of other part of right foot with other specified 10/01/2019 No Yes severity L97.528 Non-pressure chronic ulcer of other part of left foot with other specified 10/01/2019 No Yes severity L89.613 Pressure ulcer of right heel, stage 3 10/01/2019 No Yes L89.623 Pressure  ulcer of left heel, stage 3 10/01/2019 No Yes E11.621 Type 2 diabetes mellitus with foot ulcer 10/01/2019 No Yes L97.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 10/01/2019 No Yes G82.21 Paraplegia, complete 10/01/2019 No Yes Inactive Problems Resolved Problems Electronic Signature(s) Signed: 12/03/2019 4:52:36 PM By: Harry Ham MD Entered By: [name] on 12/03/2019 15:30:24 -------------------------------------------------------------------------------- Progress Note Details Patient Name: Date of Service: [name], [name] 12/03/2019 2:45 PM Medical Record Number: [id number] Patient Account Number: [id number] Date of Birth/Sex: Treating RN: 07/14/1965 (54 y.o. [name] Mention Primary Care Provider: Juluis [name] Other Clinician: Referring Provider: Treating Provider/Extender: [name] in Treatment: 9 Subjective History of Present Illness (HPI) ADMISSION 10/02/2018 This is a 54 year old Spanish-speaking man who arrived accompanied by his wife. 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 Apparently 6 weeks ago his wife noted blisters on his bilateral fifth metatarsal heads and heels which she feels was from excessive pressure on the foot rests of his wheelchair. He was seen in the emergency room early in August and then was admitted to hospital from 8/10 through 8/14 with cellulitis of his feet. He was ultimately discharged on Keflex. Arterial studies were done in the hospital that showed an ABI of 1.61 on the right and 1.36 on the left but triphasic waveforms on the left and biphasic on the right. DVT rule out study was negative. X-ray of the bilateral feet did not show osteomyelitis. They have been dealing with these wounds at home. I think they are using Betadine. He has Medicaid and does not have home health. He comes in today  with wounds on his bilateral plantar fifth metatarsal heads Achilles area of both heels and an area on the left lateral leg. His wife explains this as he always laterally rotates his legs when he is sitting in the wheelchair or in bed. She even seemed to believe that before his injury he actually walked on the outside of his feet. Past medical history includes type 2 diabetes, IVC filter on  Eliquis although I am not sure what the issue was here. Paraplegia T1-T6 8/27; the patient has 4 open wounds mirror-image areas over the plantar fifth metatarsal heads. Area on the right Achilles area and then on the left lateral calf. We have been using Iodoflex. He is on Eliquis 9/2 debridement I did last week caused copious amounts of bleeding which was difficult to control. He is on Eliquis. He has the 2 mirror-image wounds over the plantar fifth metatarsal heads in the area on the right Achilles and then an area on the left lateral calf. Both the fifth metatarsal head wounds on the left lateral calf of necrotic eschar on the surface. Black discoloration likely partially the silver nitrate we had to use last time 9/9; he arrives back in clinic today with the wounds on the plantar fifth metatarsal heads and exactly the same nonviable situation. He also has an area on the Achilles part of his right heel and the left lateral leg in a very similar situation. Nothing is making much progress I took some time to review his arterial studies from his hospitalization before he came to this clinic. On the right he had noncompressible ABIs with biphasic waveforms. They did not do a TBI in the right. On the left he had noncompressible ABIs at 1.36. However his TBI was 0.70 with triphasic waveforms at the dorsalis pedis he had a DVT rule out study that was negative. We have been using Iodoflex without much progress back in compression as he does not have access to wound care supplies 9/17; he comes back in with the same  adherent eschar over both fifth met heads. I am really uncertain about this.. I used Iodoflex and last week switch to Sorbact. The area on the left posterior calf still with adherent debris. The right heel looked better 9/24 unfortunately his interpreter had to leave before we got in the room. Not much change. The right fifth met head is better however the left is not much improved we have been using Iodoflex 10/1; he has the mirror-image wounds on the firth metatarsal head. The area on the right looks better the area on the left still copious amounts of necrotic material which is probably subcutaneous and muscle. This is deep but I was able to get this down to a healthy surface albeit with an extensive debridement. We have been using Iodoflex. The area on the right heel looks like it is contracting. The patient has significant chronic venous insufficiency and lymphedema. I have been keeping him meaning in compression for this reason and also this allows [place] to keep the dressings on all week. The patient does not have insurance 10/8; Bilateral 5th met head wounds likely pressure in the setting of paraplegia. Also right posterior right heel and left lat calf. Making progress with Iodoflex 10/14; we continue to make good progress with the surface of the wounds over the fifth metatarsal heads these are deep punched out pressure ulcers in the setting of diabetes and paraplegia. Also the area on the left lateral calf. We have had continued improvement in the right heel wound. I change the primary dressing to silver collagen today I am hoping to stimulate some granulation here. We finally have the surfaces of the wounds commensurate with that goal 10/22; not much improvement in fact the area on the right fifth met head needed debridement today which it had in last week. This is more shallow and it does have rims of epithelialization. Area on the  left fifth met head is about the same left lateral leg is  necrotic black covering. The area on the right Achilles heel is much better Objective Constitutional Sitting or standing Blood Pressure is within target range for patient.. Pulse regular and within target range for patient.Marland Kitchen Respirations regular, non-labored and within target range.. Temperature is normal and within the target range for the patient.Marland Kitchen Appears in no distress. Vitals Time Taken: 2:52 PM, Height: 68 in, Weight: 235 lbs, BMI: 35.7, Temperature: 98.4 F, Pulse: 71 bpm, Respiratory Rate: 18 breaths/min, Blood Pressure: 144/82 mmHg. Cardiovascular Pedal pulses are palpable. Edema is well controlled. General Notes: Wound exam; punched-out wounds on the right fifth met head better looking surface on the left but not the right. The right is the more shallow wound required debridement today with a #5 curette necrotic subcutaneous tissue this does however have rims of epithelialization. ooThe 2 additional wounds on the right Achilles heel is a lot better. Not so much the area in the left leg which has necrotic surface that I have once again removed Integumentary (Hair, Skin) Wound #1 status is Open. Original cause of wound was Gradually Appeared. The wound is located on the Right Metatarsal head fifth. The wound measures 3cm length x 1.5cm width x 0.5cm depth; 3.534cm^2 area and 1.767cm^3 volume. There is tendon and Fat Layer (Subcutaneous Tissue) exposed. There is no tunneling or undermining noted. There is a medium amount of serosanguineous drainage noted. The wound margin is distinct with the outline attached to the wound base. There is medium (34-66%) red, pink granulation within the wound bed. There is a medium (34-66%) amount of necrotic tissue within the wound bed including Adherent Slough. Wound #2 status is Healed - Epithelialized. Original cause of wound was Gradually Appeared. The wound is located on the Right Calcaneus. The wound measures 0cm length x 0cm width x 0cm depth;  0cm^2 area and 0cm^3 volume. There is no tunneling or undermining noted. There is a none present amount of drainage noted. The wound margin is distinct with the outline attached to the wound base. There is no granulation within the wound bed. There is no necrotic tissue within the wound bed. Wound #3 status is Open. Original cause of wound was Gradually Appeared. The wound is located on the Left Metatarsal head fifth. The wound measures 2.9cm length x 1.9cm width x 0.6cm depth; 4.328cm^2 area and 2.597cm^3 volume. There is muscle, tendon, and Fat Layer (Subcutaneous Tissue) exposed. There is no tunneling or undermining noted. There is a medium amount of serosanguineous drainage noted. The wound margin is well defined and not attached to the wound base. There is medium (34-66%) red granulation within the wound bed. There is a medium (34-66%) amount of necrotic tissue within the wound bed including Eschar and Adherent Slough. Wound #4 status is Open. Original cause of wound was Pressure Injury. The wound is located on the Left,Lateral Lower Leg. The wound measures 2cm length x 1.7cm width x 0.1cm depth; 2.67cm^2 area and 0.267cm^3 volume. There is Fat Layer (Subcutaneous Tissue) exposed. There is no tunneling or undermining noted. There is a small amount of serosanguineous drainage noted. The wound margin is distinct with the outline attached to the wound base. There is small (1- 33%) pink granulation within the wound bed. There is a large (67-100%) amount of necrotic tissue within the wound bed including Eschar. Assessment Active Problems ICD-10 Non-pressure chronic ulcer of other part of right foot with other specified severity Non-pressure chronic ulcer of other  part of left foot with other specified severity Pressure ulcer of right heel, stage 3 Pressure ulcer of left heel, stage 3 Type 2 diabetes mellitus with foot ulcer Non-pressure chronic ulcer of left calf with other specified  severity Paraplegia, complete Procedures Wound #1 Pre-procedure diagnosis of Wound #1 is a Pressure Ulcer located on the Right Metatarsal head fifth . There was a Excisional Skin/Subcutaneous Tissue Debridement with a total area of 4.5 sq cm performed by [name]., MD. With the following instrument(s): Curette to remove Viable and Non-Viable tissue/material. Material removed includes Subcutaneous Tissue and Slough and. No specimens were taken. A time out was conducted at 15:20, prior to the start of the procedure. A Minimum amount of bleeding was controlled with Pressure. The procedure was tolerated well with a pain level of Insensate throughout and a pain level of Insensate following the procedure. Post Debridement Measurements: 3cm length x 1.5cm width x 0.5cm depth; 1.767cm^3 volume. Post debridement Stage noted as Category/Stage IV. Character of Wound/Ulcer Post Debridement requires further debridement. Post procedure Diagnosis Wound #1: Same as Pre-Procedure Pre-procedure diagnosis of Wound #1 is a Pressure Ulcer located on the Right Metatarsal head fifth . There was a Four Layer Compression Therapy Procedure by Deon Pilling, RN. Post procedure Diagnosis Wound #1: Same as Pre-Procedure Wound #4 Pre-procedure diagnosis of Wound #4 is a Pressure Ulcer located on the Left,Lateral Lower Leg . There was a Excisional Skin/Subcutaneous Tissue Debridement with a total area of 3.4 sq cm performed by [name]., MD. With the following instrument(s): Curette to remove Viable and Non-Viable tissue/material. Material removed includes Subcutaneous Tissue and Slough and. No specimens were taken. A time out was conducted at 15:20, prior to the start of the procedure. A Minimum amount of bleeding was controlled with Pressure. The procedure was tolerated well with a pain level of Insensate throughout and a pain level of Insensate following the procedure. Post Debridement Measurements: 2cm  length x 1.7cm width x 0.1cm depth; 0.267cm^3 volume. Post debridement Stage noted as Category/Stage III. Character of Wound/Ulcer Post Debridement is improved. Post procedure Diagnosis Wound #4: Same as Pre-Procedure Pre-procedure diagnosis of Wound #4 is a Pressure Ulcer located on the Left,Lateral Lower Leg . There was a Four Layer Compression Therapy Procedure by Deon Pilling, RN. Post procedure Diagnosis Wound #4: Same as Pre-Procedure Plan Follow-up Appointments: Return Appointment in 1 week. Dressing Change Frequency: Do not change entire dressing for one week. - all wounds Skin Barriers/Peri-Wound Care: Moisturizing lotion - both legs. Wound Cleansing: May shower with protection. - Dressing cannot get wet. Use cast protector if taking a shower Primary Wound Dressing: Wound #1 Right Metatarsal head fifth: Silver Collagen - moisten with hydrogel. Wound #3 Left Metatarsal head fifth: Silver Collagen - moisten with hydrogel. Wound #4 Left,Lateral Lower Leg: Silver Collagen - moisten with hydrogel. Secondary Dressing: Wound #1 Right Metatarsal head fifth: Dry Gauze Drawtex - cut to fit inside wound margins to hold collagen in place Wound #3 Left Metatarsal head fifth: Dry Gauze Drawtex - cut to fit inside wound margins to hold collagen in place Wound #4 Left,Lateral Lower Leg: Dry Gauze ABD pad - both metatarsal heads to offer additional offloading Edema Control: 4 layer compression - Bilateral Elevate legs to the level of the heart or above for 30 minutes daily and/or when sitting, a frequency of: - throughout the day Off-Loading: Other: - avoid pressure to feet 1. I am continuing with silver collagen-based dressings. 2. Still under compression 3. We  do not have options for advanced treatment Electronic Signature(s) Signed: 12/03/2019 4:52:36 PM By: Harry Ham MD Entered By: [name] on 12/03/2019  15:34:39 -------------------------------------------------------------------------------- SuperBill Details Patient Name: Date of Service: [name], [place] S 12/03/2019 Medical Record Number: [id number] Patient Account Number: [id number] Date of Birth/Sex: Treating RN: 18-Jun-1965 (54 y.o. 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, complete Facility Procedures CPT4 Code: [id number] Description: 28118 - DEB SUBQ TISSUE 20 SQ CM/< ICD-10 Diagnosis Description L97.518 Non-pressure chronic ulcer of other part of right foot with other specified sev L97.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 Modifier: erity Quantity: 1 Physician Procedures : CPT4 Code Description Modifier [id number] - WC PHYS SUBQ TISS 20 SQ CM ICD-10 Diagnosis Description L97.518 Non-pressure chronic ulcer of other part of right foot with other specified severity L97.228 Non-pressure chronic ulcer of left calf with  other specified severity Quantity: 1 Electronic Signature(s) Signed: 12/03/2019 4:52:36 PM By: Harry Ham MD Entered By: [name] on 12/03/2019 15:34:51

## 2019-12-03 NOTE — Progress Notes (Signed)
ISAACK, PREBLE ([id number]) Visit Report for 12/03/2019 Arrival Information Details Patient Name: Date of Service: [name] 12/03/2019 2:45 PM Medical Record Number: [id number] Patient Account Number: [id number] Date of Birth/Sex: Treating RN: 1965/12/01 (54 y.o. [name] Primary Care Girl Schissler: [name] Other Clinician: Referring Ayub Kirsh: Treating Ladd Cen/Extender: [name] in Treatment: 9 Visit Information History Since Last Visit Added or deleted any medications: No Patient Arrived: Wheel Chair Any new allergies or adverse reactions: No Arrival Time: 14:52 Had a fall or experienced change in No Accompanied By: wife and translator activities of daily living that may affect Transfer Assistance: None risk of falls: Patient Identification Verified: Yes Signs or symptoms of abuse/neglect since last visito No Secondary Verification Process Completed: Yes Hospitalized since last visit: No Patient Requires Transmission-Based Precautions: No Implantable device outside of the clinic excluding No Patient Has Alerts: Yes cellular tissue based products placed in the center Patient Alerts: Translator Required since last visit: right ABI 1.61 Has Dressing in Place as Prescribed: Yes Left ABI 1.36 Has Compression in Place as Prescribed: Yes Pain Present Now: No Electronic Signature([name]: 12/03/2019 5:00:52 PM By: Cherylin Mylar Entered By: Cherylin Mylar on 12/03/2019 14:52:21 -------------------------------------------------------------------------------- Compression Therapy Details Patient Name: Date of Service: [name] 12/03/2019 2:45 PM Medical Record Number: [id number] Patient Account Number: [id number] Date of Birth/Sex: Treating RN: 23-Aug-1965 (54 y.o. [name] Primary Care Branna Cortina: [name] Other Clinician: Referring Damere Brandenburg: Treating Joetta Delprado/Extender: [name] in Treatment: 9 Compression Therapy Performed for Wound Assessment: Wound #1 Right Metatarsal head fifth Performed By: Clinician Shawn Stall, RN Compression Type: Four Layer Post Procedure Diagnosis Same as Pre-procedure Electronic Signature([name]: 12/03/2019 5:21:31 PM By: Zenaida Deed RN, BSN Entered By: Zenaida Deed on 12/03/2019 15:28:59 -------------------------------------------------------------------------------- Compression Therapy Details Patient Name: Date of Service: [name], [name] 12/03/2019 2:45 PM Medical Record Number: [id number] Patient Account Number: [id number] Date of Birth/Sex: Treating RN: 1966/01/09 (54 y.o. [name] Primary Care Shylynn Bruning: [name] Other Clinician: Referring Mead Slane: Treating Haile Toppins/Extender: [name] in Treatment: 9 Compression Therapy Performed for Wound Assessment: Wound #4 Left,Lateral Lower Leg Performed By: Clinician Shawn Stall, RN Compression Type: Four Layer Post Procedure Diagnosis Same as Pre-procedure Electronic Signature([name]: 12/03/2019 5:21:31 PM By: Zenaida Deed RN, BSN Entered By: Zenaida Deed on 12/03/2019 15:28:59 -------------------------------------------------------------------------------- Encounter Discharge Information Details Patient Name: Date of Service: [name], [name] 12/03/2019 2:45 PM Medical Record Number: [id number] Patient Account Number: [id number] Date of Birth/Sex: Treating RN: Nov 04, 1965 (54 y.o. [name] Primary Care Tabria Steines: [name] Other Clinician: Referring Anushka Hartinger: Treating Lavar Rosenzweig/Extender: [name] in Treatment: 9 Encounter Discharge Information Items Post Procedure Vitals Discharge Condition: Stable Temperature (F): 98.4 Ambulatory Status: Wheelchair Pulse (bpm): 71 Discharge Destination: Home Respiratory  Rate (breaths/min): 18 Transportation: Other Blood Pressure (mmHg): 144/82 Accompanied By: wife and translator Schedule Follow-up Appointment: Yes Clinical Summary of Care: Patient Declined Electronic Signature([name]: 12/03/2019 5:00:52 PM By: Cherylin Mylar Entered By: Cherylin Mylar on 12/03/2019 17:00:14 -------------------------------------------------------------------------------- Lower Extremity Assessment Details Patient Name: Date of Service: [name] 12/03/2019 2:45 PM Medical Record Number: [id number] Patient Account Number: [id number] Date of Birth/Sex: Treating RN: 1965-05-30 (54 y.o. [name] Primary Care Avina Eberle: [name] Other Clinician: Referring Juron Vorhees: Treating Jeromie Gainor/Extender: [name] in Treatment: 9 Edema Assessment Assessed: Kyra Searles: No] Franne Forts: No] Edema: [Left: Yes] [Right: Yes] Calf Left:  Right: Point of Measurement: From Medial Instep 44.5 cm 39.5 cm Ankle Left: Right: Point of Measurement: From Medial Instep 24 cm 25.5 cm Vascular Assessment Pulses: Dorsalis Pedis Palpable: [Left:No] [Right:No] Electronic Signature([name]: 12/03/2019 5:00:52 PM By: Cherylin Mylar Entered By: Cherylin Mylar on 12/03/2019 15:02:05 -------------------------------------------------------------------------------- Multi Wound Chart Details Patient Name: Date of Service: [name], [name] 12/03/2019 2:45 PM Medical Record Number: [id number] Patient Account Number: [id number] Date of Birth/Sex: Treating RN: [birth year] (54 y.o. [name] Primary Care Sasuke Yaffe: [name] Other Clinician: Referring Norm Wray: Treating Aryia Delira/Extender: [name] in Treatment: 9 Vital Signs Height(in): 68 Pulse(bpm): 71 Weight(lbs): 235 Blood Pressure(mmHg): 144/82 Body Mass Index(BMI): 36 Temperature(F): 98.4 Respiratory Rate(breaths/min):  18 Photos: [1:No Photos Right Metatarsal head fifth] [2:No Photos Right Calcaneus] [3:No Photos Left Metatarsal head fifth] Wound Location: [1:Gradually Appeared] [2:Gradually Appeared] [3:Gradually Appeared] Wounding Event: [1:Pressure Ulcer] [2:Pressure Ulcer] [3:Pressure Ulcer] Primary Etiology: [1:Hypotension, Type II Diabetes] [2:Hypotension, Type II Diabetes] [3:Hypotension, Type II Diabetes] Comorbid History: [1:08/12/2019] [2:08/12/2019] [3:08/12/2019] Date Acquired: [1:9] [2:9] [3:9] Weeks of Treatment: [1:Open] [2:Healed - Epithelialized] [3:Open] Wound Status: [1:3x1.5x0.5] [2:0x0x0] [3:2.9x1.9x0.6] Measurements L x W x D (cm) [1:3.534] [2:0] [3:4.328] A (cm) : rea [1:1.767] [2:0] [3:2.597] Volume (cm) : [1:17.30%] [2:100.00%] [3:23.50%] % Reduction in A rea: [1:-313.80%] [2:100.00%] [3:-359.60%] % Reduction in Volume: [1:Category/Stage IV] [2:Category/Stage III] [3:Category/Stage IV] Classification: [1:Medium] [2:None Present] [3:Medium] Exudate A mount: [1:Serosanguineous] [2:N/A] [3:Serosanguineous] Exudate Type: [1:red, brown] [2:N/A] [3:red, brown] Exudate Color: [1:Distinct, outline attached] [2:Distinct, outline attached] [3:Well defined, not attached] Wound Margin: [1:Medium (34-66%)] [2:None Present (0%)] [3:Medium (34-66%)] Granulation A mount: [1:Red, Pink] [2:N/A] [3:Red] Granulation Quality: [1:Medium (34-66%)] [2:None Present (0%)] [3:Medium (34-66%)] Necrotic A mount: [1:Adherent Slough] [2:N/A] [3:Eschar, Adherent Slough] Necrotic Tissue: [1:Fat Layer (Subcutaneous Tissue): Yes Fascia: No] [3:Fat Layer (Subcutaneous Tissue): Yes] Exposed Structures: [1:Tendon: Yes Fascia: No Muscle: No Joint: No Bone: No Small (1-33%)] [2:Fat Layer (Subcutaneous Tissue): No Tendon: No Muscle: No Joint: No Bone: No Large (67-100%)] [3:Tendon: Yes Muscle: Yes Fascia: No Joint: No Bone: No Small (1-33%)] Epithelialization: [1:Debridement - Excisional] [2:N/A] [3:N/A] Debridement:  [1:15:20] [2:N/A] [3:N/A] Pre-procedure Verification/Time Out Taken: [1:Subcutaneous, Slough] [2:N/A] [3:N/A] Tissue Debrided: [1:Skin/Subcutaneous Tissue] [2:N/A] [3:N/A] Level: [1:4.5] [2:N/A] [3:N/A] Debridement A (sq cm): [1:rea Curette] [2:N/A] [3:N/A] Instrument: [1:Minimum] [2:N/A] [3:N/A] Bleeding: [1:Pressure] [2:N/A] [3:N/A] Hemostasis A chieved: [1:Insensate] [2:N/A] [3:N/A] Procedural Pain: [1:Insensate] [2:N/A] [3:N/A] Post Procedural Pain: [1:Procedure was tolerated well] [2:N/A] [3:N/A] Debridement Treatment Response: [1:3x1.5x0.5] [2:N/A] [3:N/A] Post Debridement Measurements L x W x D (cm) [1:1.767] [2:N/A] [3:N/A] Post Debridement Volume: (cm) [1:Category/Stage IV] [2:N/A] [3:N/A] Post Debridement Stage: [1:Compression Therapy] [2:N/A] [3:N/A] Procedures Performed: [1:Debridement] [2:4 N/A] [3:N/A] Photos: [1:No Photos Left, Lateral Lower Leg] [2:N/A N/A] [3:N/A N/A] Wound Location: [1:Pressure Injury] [2:N/A] [3:N/A] Wounding Event: [1:Pressure Ulcer] [2:N/A] [3:N/A] Primary Etiology: [1:Hypotension, Type II Diabetes] [2:N/A] [3:N/A] Comorbid History: [1:08/12/2019] [2:N/A] [3:N/A] Date Acquired: [1:9] [2:N/A] [3:N/A] Weeks of Treatment: [1:Open] [2:N/A] [3:N/A] Wound Status: [1:2x1.7x0.1] [2:N/A] [3:N/A] Measurements L x W x D (cm) [1:2.67] [2:N/A] [3:N/A] A (cm) : rea [1:0.267] [2:N/A] [3:N/A] Volume (cm) : [1:9.30%] [2:N/A] [3:N/A] % Reduction in A rea: [1:9.50%] [2:N/A] [3:N/A] % Reduction in Volume: [1:Category/Stage III] [2:N/A] [3:N/A] Classification: [1:Small] [2:N/A] [3:N/A] Exudate A mount: [1:Serosanguineous] [2:N/A] [3:N/A] Exudate Type: [1:red, brown] [2:N/A] [3:N/A] Exudate Color: [1:Distinct, outline attached] [2:N/A] [3:N/A] Wound Margin: [1:Small (1-33%)] [2:N/A] [3:N/A] Granulation A mount: [1:Pink] [2:N/A] [3:N/A] Granulation Quality: [1:Large (67-100%)] [2:N/A] [3:N/A] Necrotic A mount: [1:Eschar] [2:N/A] [3:N/A] Necrotic Tissue: [1:Fat  Layer (Subcutaneous Tissue): Yes N/A] [3:N/A] Exposed Structures: [1:Fascia: No Tendon: No Muscle: No Joint: No Bone: No Small (1-33%)] [2:N/A] [3:N/A] Epithelialization: [1:Debridement - Excisional] [2:N/A] [3:N/A] Debridement: Pre-procedure Verification/Time Out 15:20 [2:N/A] [3:N/A] Taken: [1:Subcutaneous, Slough] [2:N/A] [3:N/A] Tissue Debrided: [1:Skin/Subcutaneous Tissue] [2:N/A] [3:N/A] Level: [1:3.4] [2:N/A] [3:N/A] Debridement A (sq cm): [1:rea Curette] [2:N/A] [3:N/A] Instrument: [1:Minimum] [2:N/A] [3:N/A] Bleeding: [1:Pressure] [2:N/A] [3:N/A] Hemostasis A chieved: [1:Insensate] [2:N/A] [3:N/A] Procedural Pain: [1:Insensate] [2:N/A] [3:N/A] Post Procedural Pain: [1:Procedure was tolerated well] [2:N/A] [3:N/A] Debridement Treatment Response: [1:2x1.7x0.1] [2:N/A] [3:N/A] Post Debridement Measurements L x W x D (cm) [1:0.267] [2:N/A] [3:N/A] Post Debridement Volume: (cm) [1:Category/Stage III] [2:N/A] [3:N/A] Post Debridement Stage: [1:Compression Therapy] [2:N/A] [3:N/A] Procedures Performed: [1:Debridement] Treatment Notes Electronic Signature([name]: 12/03/2019 4:52:36 PM By: Baltazar Najjar MD Signed: 12/03/2019 5:21:31 PM By: Zenaida Deed RN, BSN Entered By: Baltazar Najjar on 12/03/2019 15:30:32 -------------------------------------------------------------------------------- Multi-Disciplinary Care Plan Details Patient Name: Date of Service: [name], [name] 12/03/2019 2:45 PM Medical Record Number: [id number] Patient Account Number: [id number] Date of Birth/Sex: Treating RN: December 24, 1965 (54 y.o. [name] Primary Care Mckena Chern: [name] Other Clinician: Referring Akiva Josey: Treating Jenah Vanasten/Extender: [name] in Treatment: 9 Active Inactive Nutrition Nursing Diagnoses: Impaired glucose control: actual or potential Goals: Patient/caregiver verbalizes understanding of need to maintain therapeutic  glucose control per primary care physician Date Initiated: 10/01/2019 Target Resolution Date: 12/31/2019 Goal Status: Active Interventions: Provide education on elevated blood sugars and impact on wound healing Notes: Wound/Skin Impairment Nursing Diagnoses: Impaired tissue integrity Goals: Ulcer/skin breakdown will have a volume reduction of 50% by week 8 Date Initiated: 10/01/2019 Date Inactivated: 12/03/2019 Target Resolution Date: 12/03/2019 Unmet Reason: paraplegia and Goal Status: Unmet offloading management Ulcer/skin breakdown will have a volume reduction of 80% by week 12 Date Initiated: 12/03/2019 Target Resolution Date: 12/31/2019 Goal Status: Active Interventions: Provide education on ulcer and skin care Notes: Electronic Signature([name]: 12/03/2019 5:21:31 PM By: Zenaida Deed RN, BSN Entered By: Zenaida Deed on 12/03/2019 15:25:08 -------------------------------------------------------------------------------- Pain Assessment Details Patient Name: Date of Service: [name], [name] 12/03/2019 2:45 PM Medical Record Number: [id number] Patient Account Number: [id number] Date of Birth/Sex: Treating RN: [birth year] (54 y.o. [name] Primary Care Burlene Montecalvo: [name] Other Clinician: Referring Honor Fairbank: Treating Adyn Serna/Extender: [name] in Treatment: 9 Active Problems Location of Pain Severity and Description of Pain Patient Has Paino No Site Locations Pain Management and Medication Current Pain Management: Electronic Signature([name]: 12/03/2019 5:00:52 PM By: Cherylin Mylar Entered By: Cherylin Mylar on 12/03/2019 14:52:50 -------------------------------------------------------------------------------- Patient/Caregiver Education Details Patient Name: Date of Service: [name] 10/22/2021andnbsp2:45 PM Medical Record Number: [id number] Patient Account Number:  [id number] Date of Birth/Gender: Treating RN: [birth year] (54 y.o. [name] Primary Care Physician: [name] Other Clinician: Referring Physician: Treating Physician/Extender: [name] in Treatment: 9 Education Assessment Education Provided To: Patient Education Topics Provided Pressure: Methods: Explain/Verbal Responses: Reinforcements needed, State content correctly Wound/Skin Impairment: Methods: Explain/Verbal Responses: Reinforcements needed, State content correctly Electronic Signature([name]: 12/03/2019 5:21:31 PM By: Zenaida Deed RN, BSN Entered By: Zenaida Deed on 12/03/2019 15:25:32 -------------------------------------------------------------------------------- Wound Assessment Details Patient Name: Date of Service: [name] 12/03/2019 2:45 PM Medical Record Number: [id number] Patient Account Number: [id number] Date of Birth/Sex: Treating RN: 1965/03/02 (54 y.o. [name] Primary Care Ahliyah Nienow: [name] Other Clinician: Referring Kolbey Teichert: Treating Roark Rufo/Extender: [name] in Treatment: 9 Wound Status Wound Number: 1 Primary  Etiology: Pressure Ulcer Wound Location: Right Metatarsal head fifth Wound Status: Open Wounding Event: Gradually Appeared Comorbid History: Hypotension, Type II Diabetes Date Acquired: 08/12/2019 Weeks Of Treatment: 9 Clustered Wound: No Wound Measurements Length: (cm) 3 Width: (cm) 1.5 Depth: (cm) 0.5 Area: (cm) 3.534 Volume: (cm) 1.767 % Reduction in Area: 17.3% % Reduction in Volume: -313.8% Epithelialization: Small (1-33%) Tunneling: No Undermining: No Wound Description Classification: Category/Stage IV Wound Margin: Distinct, outline attached Exudate Amount: Medium Exudate Type: Serosanguineous Exudate Color: red, brown Foul Odor After Cleansing: No Slough/Fibrino Yes Wound Bed Granulation Amount:  Medium (34-66%) Exposed Structure Granulation Quality: Red, Pink Fascia Exposed: No Necrotic Amount: Medium (34-66%) Fat Layer (Subcutaneous Tissue) Exposed: Yes Necrotic Quality: Adherent Slough Tendon Exposed: Yes Muscle Exposed: No Joint Exposed: No Bone Exposed: No Treatment Notes Wound #1 (Right Metatarsal head fifth) 1. Cleanse With Wound Cleanser Soap and water 2. Periwound Care Moisturizing lotion 3. Primary Dressing Applied Collegen AG 4. Secondary Dressing ABD Pad Dry Gauze Heel Cup Drawtex 6. Support Layer Applied 4 layer compression wrap Notes abd to left lateral leg. heel cup to right heel. netting Electronic Signature([name]: 12/03/2019 5:00:52 PM By: Cherylin Mylar Entered By: Cherylin Mylar on 12/03/2019 15:03:17 -------------------------------------------------------------------------------- Wound Assessment Details Patient Name: Date of Service: [name] 12/03/2019 2:45 PM Medical Record Number: [id number] Patient Account Number: [id number] Date of Birth/Sex: Treating RN: 1965/02/27 (54 y.o. [name] Primary Care Vonzell Lindblad: [name] Other Clinician: Referring Findlay Dagher: Treating Tiwana Chavis/Extender: [name] in Treatment: 9 Wound Status Wound Number: 2 Primary Etiology: Pressure Ulcer Wound Location: Right Calcaneus Wound Status: Healed - Epithelialized Wounding Event: Gradually Appeared Comorbid History: Hypotension, Type II Diabetes Date Acquired: 08/12/2019 Weeks Of Treatment: 9 Clustered Wound: No Wound Measurements Length: (cm) Width: (cm) Depth: (cm) Area: (cm) Volume: (cm) 0 % Reduction in Area: 100% 0 % Reduction in Volume: 100% 0 Epithelialization: Large (67-100%) 0 Tunneling: No 0 Undermining: No Wound Description Classification: Category/Stage III Wound Margin: Distinct, outline attached Exudate Amount: None Present Foul Odor After Cleansing:  No Slough/Fibrino No Wound Bed Granulation Amount: None Present (0%) Exposed Structure Necrotic Amount: None Present (0%) Fascia Exposed: No Fat Layer (Subcutaneous Tissue) Exposed: No Tendon Exposed: No Muscle Exposed: No Joint Exposed: No Bone Exposed: No Electronic Signature([name]: 12/03/2019 5:00:52 PM By: Cherylin Mylar Entered By: Cherylin Mylar on 12/03/2019 15:02:40 -------------------------------------------------------------------------------- Wound Assessment Details Patient Name: Date of Service: [name] 12/03/2019 2:45 PM Medical Record Number: [id number] Patient Account Number: [id number] Date of Birth/Sex: Treating RN: 1965/08/27 (54 y.o. [name] Primary Care Donavan Kerlin: [name] Other Clinician: Referring Lenzie Montesano: Treating Emmajane Altamura/Extender: [name] in Treatment: 9 Wound Status Wound Number: 3 Primary Etiology: Pressure Ulcer Wound Location: Left Metatarsal head fifth Wound Status: Open Wounding Event: Gradually Appeared Comorbid History: Hypotension, Type II Diabetes Date Acquired: 08/12/2019 Weeks Of Treatment: 9 Clustered Wound: No Wound Measurements Length: (cm) 2.9 Width: (cm) 1.9 Depth: (cm) 0.6 Area: (cm) 4.328 Volume: (cm) 2.597 % Reduction in Area: 23.5% % Reduction in Volume: -359.6% Epithelialization: Small (1-33%) Tunneling: No Undermining: No Wound Description Classification: Category/Stage IV Wound Margin: Well defined, not attached Exudate Amount: Medium Exudate Type: Serosanguineous Exudate Color: red, brown Foul Odor After Cleansing: No Slough/Fibrino Yes Wound Bed Granulation Amount: Medium (34-66%) Exposed Structure Granulation Quality: Red Fascia Exposed: No Necrotic Amount: Medium (34-66%) Fat Layer (Subcutaneous Tissue) Exposed: Yes Necrotic Quality: Eschar, Adherent Slough Tendon Exposed: Yes Muscle Exposed: Yes Necrosis of  Muscle:  No Joint Exposed: No Bone Exposed: No Treatment Notes Wound #3 (Left Metatarsal head fifth) 1. Cleanse With Wound Cleanser Soap and water 2. Periwound Care Moisturizing lotion 3. Primary Dressing Applied Collegen AG 4. Secondary Dressing ABD Pad Dry Gauze Heel Cup Drawtex 6. Support Layer Applied 4 layer compression wrap Notes abd to left lateral leg. heel cup to right heel. netting Electronic Signature([name]: 12/03/2019 5:00:52 PM By: Cherylin Mylarwiggins, Shannon Entered By: Cherylin Mylarwiggins, Shannon on 12/03/2019 15:05:09 -------------------------------------------------------------------------------- Wound Assessment Details Patient Name: Date of Service: Harry ChurchFA CUNDO, [name] 12/03/2019 2:45 PM Medical Record Number: [id number] Patient Account Number: [id number] Date of Birth/Sex: Treating RN: 01/16/1966 (54 y.o. Katherina RightM) Dwiggins, Shannon Primary Care Vedant Shehadeh: Gwinda PasseEdwards, Michelle Other Clinician: Referring Sherolyn Trettin: Treating Mckinsey Keagle/Extender: Aurelio Brashobson, Michael Edwards, Michelle Weeks in Treatment: 9 Wound Status Wound Number: 4 Primary Etiology: Pressure Ulcer Wound Location: Left, Lateral Lower Leg Wound Status: Open Wounding Event: Pressure Injury Comorbid History: Hypotension, Type II Diabetes Date Acquired: 08/12/2019 Weeks Of Treatment: 9 Clustered Wound: No Wound Measurements Length: (cm) 2 Width: (cm) 1.7 Depth: (cm) 0.1 Area: (cm) 2.67 Volume: (cm) 0.267 % Reduction in Area: 9.3% % Reduction in Volume: 9.5% Epithelialization: Small (1-33%) Tunneling: No Undermining: No Wound Description Classification: Category/Stage III Wound Margin: Distinct, outline attached Exudate Amount: Small Exudate Type: Serosanguineous Exudate Color: red, brown Foul Odor After Cleansing: No Slough/Fibrino No Wound Bed Granulation Amount: Small (1-33%) Exposed Structure Granulation Quality: Pink Fascia Exposed: No Necrotic Amount: Large (67-100%) Fat Layer (Subcutaneous Tissue)  Exposed: Yes Necrotic Quality: Eschar Tendon Exposed: No Muscle Exposed: No Joint Exposed: No Bone Exposed: No Treatment Notes Wound #4 (Left, Lateral Lower Leg) 1. Cleanse With Wound Cleanser Soap and water 2. Periwound Care Moisturizing lotion 3. Primary Dressing Applied Collegen AG 4. Secondary Dressing ABD Pad Dry Gauze Heel Cup Drawtex 6. Support Layer Applied 4 layer compression wrap Notes abd to left lateral leg. heel cup to right heel. netting Electronic Signature([name]: 12/03/2019 5:00:52 PM By: Cherylin Mylarwiggins, Shannon Entered By: Cherylin Mylarwiggins, Shannon on 12/03/2019 15:06:33 -------------------------------------------------------------------------------- Vitals Details Patient Name: Date of Service: Harry MoroFA CUNDO, [name] 12/03/2019 2:45 PM Medical Record Number: [id number] Patient Account Number: [id number] Date of Birth/Sex: Treating RN: 01/16/1966 (54 y.o. Katherina RightM) Dwiggins, Shannon Primary Care Kamaljit Hizer: Gwinda PasseEdwards, Michelle Other Clinician: Referring Nattalie Santiesteban: Treating Ekam Bonebrake/Extender: Aurelio Brashobson, Michael Edwards, Michelle Weeks in Treatment: 9 Vital Signs Time Taken: 14:52 Temperature (F): 98.4 Height (in): 68 Pulse (bpm): 71 Weight (lbs): 235 Respiratory Rate (breaths/min): 18 Body Mass Index (BMI): 35.7 Blood Pressure (mmHg): 144/82 Reference Range: 80 - 120 mg / dl Electronic Signature([name]: 12/03/2019 5:00:52 PM By: Cherylin Mylarwiggins, Shannon Entered By: Cherylin Mylarwiggins, Shannon on 12/03/2019 14:52:44

## 2019-12-04 NOTE — Therapy (Signed)
[facility] [address] [place], [place], [address] Phone: [phone]   Fax:  [phone]  Physical Therapy Treatment  Patient Details  Name: [name] MRN: [id number] Date of Birth: [birth year] Referring Provider (PT): Dr. Berline Chough   Encounter Date: 12/02/2019   PT End of Session - 12/04/19 1516    Visit Number 2    Number of Visits 9    Date for PT Re-Evaluation 01/10/20    Authorization Type [employer] Medicaid - 27 OT/PT/ST  No Submission needed at this time per    Authorization - Visit Number 1    Authorization - Number of Visits 27    PT Start Time 0940    PT Stop Time 1020    PT Time Calculation (min) 40 min    Equipment Utilized During Treatment Other (comment)   slideboard   Activity Tolerance Patient tolerated treatment well    Behavior During Therapy WFL for tasks assessed/performed           Past Medical History:  Diagnosis Date   Diabetes mellitus without complication (HCC)    History of cervical fracture     Past Surgical History:  Procedure Laterality Date   CERVICAL SPINE SURGERY      There were no vitals filed for this visit.      12/02/19 0948  Symptoms/Limitations  Subjective Feels looser after trigger point injections.  Wounds continue to heal.  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 DM2  Patient Stated Goals Pt wants to be able to do exercises so he can move more and not feel so tight.  Pain Assessment  Currently in Pain? No/denies          [facility] Adult PT Treatment/Exercise - 12/04/19 1506      Bed Mobility   Bed Mobility Rolling Right;Right Sidelying to Sit;Sit to Supine    Rolling Right Moderate Assistance - Patient 50-74%    Right Sidelying to Sit 2  Helpers    Sit to Supine Moderate Assistance - Patient 50-74%      Transfers   Transfers Lateral/Scoot Transfers    Lateral/Scoot Transfers 1: +2 Total assist;With slide board    Transfer Cueing Performed w/c <> mat with surfaces level or slightly downhill.  Pt has declined to requiring +2 for slideboard transfers.  Therapist in front assisting with placement of feet, trunk control and balance while performing anterior and lateral leaning.  Pt able to assist with scooting 30-40% to L and R.        Exercises   Exercises Other Exercises    Other Exercises  Performed the following stretches below in supine.  Removed hamstring stretch due to pt demonstrating hip flexion ROM to 90 deg.  Discussed how new stretches would assist with ROM needed for rolling and bed mobility.  Also educated pt on how pt could assist caregiver with stretches at home.            Access Code: [email or web address] URL: [email or web address] Date: 12/02/2019 Prepared by: Bufford Lope  Exercises Supine Hip Adductor Stretch - 1 x daily - 7 x weekly - 3 reps - 1 sets - 30 seconds hold Hip ER stretch - 2 x daily - 7 x weekly - 4 reps - 1 sets - 1 min hold Single Knee to  Chest Stretch - 2 x daily - 7 x weekly - 2 sets - 1 minute hold Supine Piriformis Stretch - 1 x daily - 7 x weekly - 2 sets - 1 minute hold       PT Education - 12/04/19 1516    Education Details re-started LE ROM HEP    Person(s) Educated Patient;Spouse    Methods Explanation;Demonstration;Handout    Comprehension Verbalized understanding            PT Short Term Goals - 11/16/19 2050      PT SHORT TERM GOAL #1   Title Pt and wife will be instructed in HEP for BLE stretching to ensure they are performing correctly. (due by 3rd visit)    Baseline Had been issued HEP for stretching earlier this year but have not been as consistent in performance and need to review.    Time 3    Period Weeks    Status New    Target Date 12/15/19       PT SHORT TERM GOAL #2   Title Pt will be able to perform slideboard transfer max assist of 1 for improved mobility    Baseline max assist +2    Time 3    Status New    Target Date 12/15/19      PT SHORT TERM GOAL #3   Title Pt will be instructed in ways to better manage swelling in legs and pressure relief to prevent further injury.    Baseline pt currently has bilateral pressure sores on heels    Time 3    Period Weeks    Status New    Target Date 12/15/19      PT SHORT TERM GOAL #4   Title Pt will be able to maintain long sit supervision x 2 min to assist with ADLs.    Baseline unable to assess at eval but limited HS length will challenge long sit.    Time 3    Period Weeks    Status New    Target Date 12/15/19             PT Long Term Goals - 11/16/19 2059      PT LONG TERM GOAL #1   Title Pt will be able to perform slideboard transfer min assist once board is placed for improved mobility.    Baseline max assist +2    Time 8    Period Weeks    Status New    Target Date 01/10/20      PT LONG TERM GOAL #2   Title Pt will transition from long sit to short sit min assist for improved mobility.    Baseline currently hoyer lift transfer at home.    Time 8    Period Weeks    Status New    Target Date 01/10/20      PT LONG TERM GOAL #3   Title Patient will perform long sit/circle without UE support >/= 5 minutes with min guard for improved ability to assist with ADLs in bed.    Baseline unable to assess at eval but has limited hamstring length that will affect long sitting balance.    Time 8    Period Weeks    Status New    Target Date 01/10/20      PT LONG TERM GOAL #4   Title Pt will perform rolling in bed mod assist for improved mobility.    Baseline max assist    Time  8    Period Weeks    Status New    Target Date 01/10/20                 Plan - 12/04/19 1518    Clinical Impression Statement Treatment session focused on re-educating patient on  sequence for performing slideboard transfers and re-initiating LE ROM/stretching HEP to improve LE ROM for bed mobility, ADL and transfers.  Will continue to address in order to progress towards LTG.    Personal Factors and Comorbidities Comorbidity 2    Comorbidities history of DVTs, DM2    Examination-Activity Limitations Bathing;Bed Mobility;Transfers;Dressing    Examination-Participation Restrictions Community Activity    Stability/Clinical Decision Making Evolving/Moderate complexity    Rehab Potential Good    PT Frequency 1x / week   plus eval   PT Duration 8 weeks    PT Treatment/Interventions ADLs/Self Care Home Management;Cryotherapy;DME Instruction;Moist Heat;Functional mobility training;Therapeutic activities;Therapeutic exercise;Balance training;Patient/family education;Neuromuscular re-education;Manual techniques;Wheelchair mobility training;Passive range of motion    PT Next Visit Plan Have they been performing LE stretches?  SCI-FIT with UE only due to wounds on heels?  I wonder if physician would allow him to use standing frame with wounds?  Pt wants to weigh - weigh pt in chair and then when he transfers to mat, re-weigh chair. Slideboard transfers, sitting balance in short sit and long sit, bed mobility.    Consulted and Agree with Plan of Care Patient;Family member/caregiver    Family Member Consulted wife           Patient will benefit from skilled therapeutic intervention in order to improve the following deficits and impairments:  Decreased balance, Decreased mobility, Decreased knowledge of use of DME, Decreased activity tolerance, Decreased range of motion, Impaired flexibility, Decreased strength, Increased edema, Impaired sensation, Impaired tone, Impaired UE functional use  Visit Diagnosis: Muscle weakness (generalized)  Other disturbances of skin sensation  Abnormal posture  Quadriplegia, C5-C7 complete (HCC)  Other symptoms and signs involving the nervous  system     Problem List Patient Active Problem List   Diagnosis Date Noted   At risk for unstable body temperature 11/26/2019   Wheelchair dependence 11/26/2019   Pressure ulcer 09/22/2019   Cellulitis 09/22/2019   Hypokalemia 09/22/2019   Spasticity 03/08/2019   Erectile dysfunction due to diseases classified elsewhere 03/08/2019   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/22/2019   Neurogenic bowel 01/22/2019   Neurogenic bladder 01/22/2019   Myofascial pain 01/22/2019   Spinal cord injury at T1-T6 level (HCC) 12/30/2018   Quadriplegia (HCC) 12/30/2018    Dierdre Highman, PT, DPT 12/04/19    3:28 PM    [facility] [address] [place], [place], [address] Phone: [phone]   Fax:  [phone]  Name: Emmaus Brandi MRN: [id number] Date of Birth: 09-19-1965

## 2019-12-07 ENCOUNTER — Encounter: Payer: Medicaid Other | Admitting: Occupational Therapy

## 2019-12-09 ENCOUNTER — Ambulatory Visit: Payer: Medicaid Other

## 2019-12-09 ENCOUNTER — Encounter: Payer: Self-pay | Admitting: Occupational Therapy

## 2019-12-09 ENCOUNTER — Other Ambulatory Visit: Payer: Self-pay

## 2019-12-09 ENCOUNTER — Ambulatory Visit: Payer: Medicaid Other | Admitting: Occupational Therapy

## 2019-12-09 DIAGNOSIS — M25612 Stiffness of left shoulder, not elsewhere classified: Secondary | ICD-10-CM

## 2019-12-09 DIAGNOSIS — M79641 Pain in right hand: Secondary | ICD-10-CM

## 2019-12-09 DIAGNOSIS — M6281 Muscle weakness (generalized): Secondary | ICD-10-CM | POA: Diagnosis not present

## 2019-12-09 DIAGNOSIS — M25631 Stiffness of right wrist, not elsewhere classified: Secondary | ICD-10-CM

## 2019-12-09 DIAGNOSIS — R278 Other lack of coordination: Secondary | ICD-10-CM

## 2019-12-09 DIAGNOSIS — G8929 Other chronic pain: Secondary | ICD-10-CM

## 2019-12-09 DIAGNOSIS — G8253 Quadriplegia, C5-C7 complete: Secondary | ICD-10-CM

## 2019-12-09 DIAGNOSIS — M25611 Stiffness of right shoulder, not elsewhere classified: Secondary | ICD-10-CM

## 2019-12-09 DIAGNOSIS — R293 Abnormal posture: Secondary | ICD-10-CM

## 2019-12-09 DIAGNOSIS — M79642 Pain in left hand: Secondary | ICD-10-CM

## 2019-12-09 NOTE — Therapy (Signed)
[facility] [address] [place], [place], [address] Phone: [phone]   Fax:  [phone]  Physical Therapy Treatment  Patient Details  Name: [name] MRN: [id number] Date of Birth: Dec 30, 1965 Referring Provider (PT): Dr. Berline Chough   Encounter Date: 12/09/2019   PT End of Session - 12/09/19 0930    Visit Number 3    Number of Visits 9    Date for PT Re-Evaluation 01/10/20    Authorization Type [employer] Medicaid - 27 OT/PT/ST  No Submission needed at this time per    Authorization - Visit Number 2    Authorization - Number of Visits 27    PT Start Time 0928    PT Stop Time 1011    PT Time Calculation (min) 43 min    Equipment Utilized During Treatment Other (comment)   slideboard   Activity Tolerance Patient tolerated treatment well    Behavior During Therapy [facility] for tasks assessed/performed           Past Medical History:  Diagnosis Date  . Diabetes mellitus without complication (HCC)   . History of cervical fracture     Past Surgical History:  Procedure Laterality Date  . CERVICAL SPINE SURGERY      There were no vitals filed for this visit.   Subjective Assessment - 12/09/19 0930    Subjective Pt reports that he is doing ok. Has been working on the stretches with wife. Weighed pt's chair and was 377# as pt wanting to get weight. OT will weigh in chair.    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 DM2    Patient Stated Goals Pt wants to be able to do exercises so he can move more and not feel so tight.    Currently in Pain? Yes    Pain Score 6     Pain Location Shoulder    Pain Orientation Right;Left    Pain Descriptors / Indicators Aching    Pain Type  Chronic pain                             [facility] Adult PT Treatment/Exercise - 12/09/19 0934      Bed Mobility   Bed Mobility Rolling Left;Sit to Supine    Rolling Right Maximal Assistance - Patient 25-49%    Sit to Supine Maximal Assistance - Patient 25-49%   at legs     Transfers   Transfers Lateral/Scoot Transfers    Lateral/Scoot Transfers 2: Max assist    Lateral/Scoot Transfer Details (indicate cue type and reason) PT flipped up arm rest and removed hip abduction support on chair prior to placing slideboard. He was able to lean to the right to allow PT to place board with PT lifting up left leg as well. Pt able to utilize arms and head to get more twisting motion to gradually scoot over on board using arms to assist with max assist today just requiring increased time. PT repositioned feet during transfer to all them to provide some support as wearing procare boots today and they did slide on floor some.      Therapeutic Activites    Therapeutic Activities Other Therapeutic Activities    Other Therapeutic  Activities With sit to supine pt assisted to come down on right forearm and then PT student provided CGA with remaining turn down while PT lifted legs total assist on to mat. Rolling to left on mat with reaching across with right arm and swinging over to get some momentum max assist with PT turning pelvis to come over.  In left sidelying: rolling back 25% of way and then returning to sidelying x 5. Supine to long sit max assist of 2 with PT pulling on PT arms and PT student assisting behind moderately to come up to long sit. In long sit worked on reaching forward to try to find balance point. Pt grabbed to pants to try to help with stability and PT behind providing min assist to stretch forward. Worked on trying to prop on arms in long sit with PT assisting to place arms in more shoulder extension and ER. Pt with limited shoulder extension and needed mod assist to maintain  this position. Reported wrists a little sore after but not bad. Total assist with return back to supine gently.      Neuro Re-ed    Neuro Re-ed Details  Sitting edge of mat in short sit: sat a total time of about 10 minutes with trying to maintain upright posture. During sitting pt performed reaching forward and to each side x 5 each arm then reaching across body x 5 each arm with CGA for safety and PT blocking legs and feet. Coming down on forearm to the side x 5 each side CGA/min asisist to return back up.       Exercises   Exercises Other Exercises    Other Exercises  Reassessed hamstring after long sitting performance. Did note just less than 90 degrees on right and about 90 degrees on left. Performed stretch x 1 min.                    PT Short Term Goals - 11/16/19 2050      PT SHORT TERM GOAL #1   Title Pt and wife will be instructed in HEP for BLE stretching to ensure they are performing correctly. (due by 3rd visit)    Baseline Had been issued HEP for stretching earlier this year but have not been as consistent in performance and need to review.    Time 3    Period Weeks    Status New    Target Date 12/15/19      PT SHORT TERM GOAL #2   Title Pt will be able to perform slideboard transfer max assist of 1 for improved mobility    Baseline max assist +2    Time 3    Status New    Target Date 12/15/19      PT SHORT TERM GOAL #3   Title Pt will be instructed in ways to better manage swelling in legs and pressure relief to prevent further injury.    Baseline pt currently has bilateral pressure sores on heels    Time 3    Period Weeks    Status New    Target Date 12/15/19      PT SHORT TERM GOAL #4   Title Pt will be able to maintain long sit supervision x 2 min to assist with ADLs.    Baseline unable to assess at eval but limited HS length will challenge long sit.    Time 3    Period Weeks    Status New  Target Date 12/15/19             PT Long Term  Goals - 11/16/19 2059      PT LONG TERM GOAL #1   Title Pt will be able to perform slideboard transfer min assist once board is placed for improved mobility.    Baseline max assist +2    Time 8    Period Weeks    Status New    Target Date 01/10/20      PT LONG TERM GOAL #2   Title Pt will transition from long sit to short sit min assist for improved mobility.    Baseline currently hoyer lift transfer at home.    Time 8    Period Weeks    Status New    Target Date 01/10/20      PT LONG TERM GOAL #3   Title Patient will perform long sit/circle without UE support >/= 5 minutes with min guard for improved ability to assist with ADLs in bed.    Baseline unable to assess at eval but has limited hamstring length that will affect long sitting balance.    Time 8    Period Weeks    Status New    Target Date 01/10/20      PT LONG TERM GOAL #4   Title Pt will perform rolling in bed mod assist for improved mobility.    Baseline max assist    Time 8    Period Weeks    Status New    Target Date 01/10/20                 Plan - 12/09/19 1037    Clinical Impression Statement Pt required less assistance on slideboard transfer today to max assist of 1 person. He was able to help more with leaning and trunk rotation using arms to scoot across board with increased time. Pt did wel in short sit today but needed more assistance in long sit to maintain position. Pt feels his increase in weight has affected this some.    Personal Factors and Comorbidities Comorbidity 2    Comorbidities history of DVTs, DM2    Examination-Activity Limitations Bathing;Bed Mobility;Transfers;Dressing    Examination-Participation Restrictions Community Activity    Stability/Clinical Decision Making Evolving/Moderate complexity    Rehab Potential Good    PT Frequency 1x / week   plus eval   PT Duration 8 weeks    PT Treatment/Interventions ADLs/Self Care Home Management;Cryotherapy;DME Instruction;Moist  Heat;Functional mobility training;Therapeutic activities;Therapeutic exercise;Balance training;Patient/family education;Neuromuscular re-education;Manual techniques;Wheelchair mobility training;Passive range of motion    PT Next Visit Plan SCI-FIT with UE only due to wounds on heels?  I would hold on standing frame currently due to wounds and lack of proper footwear. Slideboard transfers, sitting balance in short sit and long sit, bed mobility.    Consulted and Agree with Plan of Care Patient;Family member/caregiver    Family Member Consulted wife           Patient will benefit from skilled therapeutic intervention in order to improve the following deficits and impairments:  Decreased balance, Decreased mobility, Decreased knowledge of use of DME, Decreased activity tolerance, Decreased range of motion, Impaired flexibility, Decreased strength, Increased edema, Impaired sensation, Impaired tone, Impaired UE functional use  Visit Diagnosis: Muscle weakness (generalized)  Abnormal posture  Quadriplegia, C5-C7 complete (HCC)     Problem List Patient Active Problem List   Diagnosis Date Noted  . At risk for unstable body temperature  11/26/2019  . Wheelchair dependence 11/26/2019  . Pressure ulcer 09/22/2019  . Cellulitis 09/22/2019  . Hypokalemia 09/22/2019  . Spasticity 03/08/2019  . Erectile dysfunction due to diseases classified elsewhere 03/08/2019  . 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 Neurogenic bowel 01/22/2019  . Neurogenic bladder 01/22/2019  . Myofascial pain 01/22/2019  . 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 Quadriplegia (HCC) 12/30/2018    Ronn Melena, PT, DPT, NCS 12/09/2019, 10:43 AM  [facility] [address] [place], [place], [address] Phone: [phone]   Fax:  [phone]  Name: [name] MRN: [id number] Date of Birth: 12-29-1965

## 2019-12-09 NOTE — Therapy (Signed)
[facility] [address] [place], [place], [address] Phone: [phone]   Fax:  [phone]  Occupational Therapy Treatment  Patient Details  Name: [name] MRN: [id number] Date of Birth: September 11, 1965 Referring Provider (OT): Dr. Berline Chough   Encounter Date: 12/09/2019   OT End of Session - 12/09/19 1028    Visit Number 2    Number of Visits 13    Date for OT Re-Evaluation 03/09/19    Authorization Type Medicaid    Authorization - Visit Number 2    Authorization - Number of Visits 12    OT Start Time 1015    OT Stop Time 1057    OT Time Calculation (min) 42 min    Activity Tolerance Patient tolerated treatment well    Behavior During Therapy [facility] for tasks assessed/performed           Past Medical History:  Diagnosis Date  . Diabetes mellitus without complication (HCC)   . History of cervical fracture     Past Surgical History:  Procedure Laterality Date  . CERVICAL SPINE SURGERY      There were no vitals filed for this visit.   Subjective Assessment - 12/09/19 1016    Subjective  Pt denies any pain currently. 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 DM    Limitations Fall Risk    Currently in Pain? No/denies                        OT Treatments/Exercises (OP) - 12/09/19 1023      Bed Mobility   Bed Mobility Rolling Right;Right Sidelying to Sit    Rolling Right Maximal Assistance - Patient 25-49%;2 Helpers    Right Sidelying to Sit Total Assistance - Patient < 25%;2 Helpers      Transfers   Comments SB transfer max A x 2      Exercises   Exercises Shoulder      Shoulder Exercises: Supine   Protraction AROM;Both;10 reps     Horizontal ABduction AROM;Both;10 reps   unweighted dowel   External Rotation AROM;Both;10 reps    Internal Rotation AROM;Both;10 reps    Flexion AROM;Both;10 reps    Other Supine Exercises 10 x shoulder flexion BUE with medium ball and isometric hold on ball      Neurological Re-education Exercises   Other Exercises 1 PROM with BUE. prolonged stretch BUE external rotation behind head      Manual Therapy   Manual Therapy Soft tissue mobilization    Soft tissue mobilization BUE scapula supraspinatus. LUE pec                    OT Short Term Goals - 11/19/19 1259      OT SHORT TERM GOAL #1   Title I with inital HEP-    Baseline dependent    Time 6    Period Weeks    Status New    Target Date 12/31/19      OT SHORT TERM GOAL #2   Title Pt will donn shirt with min A    Baseline mod A    Time 6    Period Weeks    Status On-going  OT SHORT TERM GOAL #3   Title Pt will report bilateral UE pain less than or equal to 4/10 during ADLS/ functional use.    Baseline bilateral UE pain 3-6/10    Time 6    Period Weeks    Status New      OT SHORT TERM GOAL #4   Title Pt will increase LUE grip strength to 17 lbs or greater for increased functional use.    Baseline RUE 30.4 lbs ,LUE 12.1 lbs    Time 6    Period Weeks    Status On-going   pt is awaiting new hospital bed     OT SHORT TERM GOAL #5   Title Pt will demonstrate improved LUE fine motor coordination for ADLs as evidenced by decreasing 9 hole peg test score to 80 secs or less.    Baseline RUE 34.22, LUE 1 min 31 secs    Time 6    Period Weeks    Status New   12/16/2018 in long sitting in clinic; pt's hospital bed is poor and does not allow pt to complete at home. Attempting to contact [employer] to determine if more suitable hospital bed can be issued. WIfe to call clinic with rep name     OT SHORT TERM GOAL #6   Title Pt will perform sliding board transfers with max A  for increased I with ADLs/ toilet  transfers prn    Baseline toatl A +2 per PT report    Time 6    Period Weeks    Status New      OT SHORT TERM GOAL #7   Title Pt / family will verbalize understanding of pain reduction strategies for UE's.    Baseline pain 3-6/10 for shoulders, hands, needs education    Time 6    Period Weeks    Status New             OT Long Term Goals - 11/19/19 1302      OT LONG TERM GOAL #1   Title Pt will donn shirt with setup    Baseline mod A    Time 12    Period Weeks    Status New      OT LONG TERM GOAL #2   Title Pt will demonstrate improved bilateral UE external rotation to [facility]  as evidenced by pt's  increased ease with supporting himself in long and circle sitting, with pain less than or equal to 3/10.    Baseline limited bilateral external rotation in shoulders, pain 3-6/10 in UE's    Time 12    Period Weeks    Status On-going   pt is awaitng new hospital bed     OT LONG TERM GOAL #3   Title Pt will demonstrate improved LUE fine motor coordination as evidenced  by decreasing 9 hole peg test score to 70 secs or less.    Baseline RUE 34.22, LUE 1 min 31 secs    Time 12    Period Weeks    Status New      OT LONG TERM GOAL #4   Title Pt/ family witllverbalize understanding of adapted strategies to maximize pt's I with ADLs/ IADLs.    Baseline needs updates/ reinforcement of strategies as pt is not perfroming consistently.    Time 12    Period Weeks    Status New      OT LONG TERM GOAL #5   Title Pt will increase RUE grip  to 36 lbs and LUE grip to 20 lbs or greater in prep for bilateral functional use.    Baseline RUE 30.4, LUE 12.1    Time 12    Period Weeks    Status New   01/04/2019 R = 26 pounds, L = 3 pounds fro combined bilateral grip strength of 29 pounds (baseline combined 8 pounds)     OT LONG TERM GOAL #6   Title Pt will perform sliding board transfers to assist with ADLS and in prep for toilet transfers with mod A to level surface.    Baseline total A +2      Time 12    Period Weeks    Status New                 Plan - 12/09/19 1730    Clinical Impression Statement Pt is motivated and working towards increasing strength and range of motion for increasing functional skills and independence. Pt is progressing towards goals.    OT Occupational Profile and History Detailed Assessment- Review of Records and additional review of physical, cognitive, psychosocial history related to current functional performance    Occupational performance deficits (Please refer to evaluation for details): ADL's;IADL's;Work;Leisure;Social Participation;Rest and Sleep;Play    Body Structure / Function / Physical Skills ADL;UE functional use;Endurance;Balance;Flexibility;Pain;FMC;ROM;GMC;Coordination;Sensation;Decreased knowledge of precautions;Decreased knowledge of use of DME;IADL;Strength;Dexterity;Mobility;Tone    Rehab Potential Good    Clinical Decision Making Limited treatment options, no task modification necessary    Comorbidities Affecting Occupational Performance: May have comorbidities impacting occupational performance    Modification or Assistance to Complete Evaluation  No modification of tasks or assist necessary to complete eval    OT Frequency 1x / week   plus eval   OT Duration Other (comment)    OT Treatment/Interventions Self-care/ADL training;Ultrasound;Energy conservation;Aquatic Therapy;DME and/or AE instruction;Patient/family education;Paraffin;Gait Training;Passive range of motion;Balance training;Fluidtherapy;Cryotherapy;Splinting;Building services engineerunctional Mobility Training;Contrast Bath;Electrical Stimulation;Moist Heat;Therapeutic exercise;Manual Therapy;Therapeutic activities;Cognitive remediation/compensation;Neuromuscular education    Plan external rotation stretches, ADL strategies    Consulted and Agree with Plan of Care Patient;Family member/caregiver    Family Member Consulted wife,interpreter           Patient will benefit from skilled  therapeutic intervention in order to improve the following deficits and impairments:   Body Structure / Function / Physical Skills: ADL, UE functional use, Endurance, Balance, Flexibility, Pain, FMC, ROM, GMC, Coordination, Sensation, Decreased knowledge of precautions, Decreased knowledge of use of DME, IADL, Strength, Dexterity, Mobility, Tone       Visit Diagnosis: Pain in left hand  Pain in right hand  Other lack of coordination  Chronic left shoulder pain  Chronic right shoulder pain  Stiffness of right shoulder, not elsewhere classified  Stiffness of left shoulder, not elsewhere classified  Stiffness of right wrist, not elsewhere classified    Problem List Patient Active Problem List   Diagnosis Date Noted  . At risk for unstable body temperature 11/26/2019  . Wheelchair dependence 11/26/2019  . Pressure ulcer 09/22/2019  . Cellulitis 09/22/2019  . Hypokalemia 09/22/2019  . Spasticity 03/08/2019  . Erectile dysfunction due to diseases classified elsewhere 03/08/2019  . 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 Neurogenic bowel 01/22/2019  . Neurogenic bladder 01/22/2019  . Myofascial pain 01/22/2019  . 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 Quadriplegia [facility](HCC) 12/30/2018    Junious DresserKirstyn M Ginni Eichler MOT, OTR/L  12/09/2019, 5:34 PM  [facility] [address] [place], [place], [address] Phone: [phone]  Fax:  [phone]  Name: [name] MRN: [id number] Date of Birth: May 03, 1965

## 2019-12-10 ENCOUNTER — Encounter (HOSPITAL_BASED_OUTPATIENT_CLINIC_OR_DEPARTMENT_OTHER): Payer: Medicaid Other | Attending: Internal Medicine | Admitting: Internal Medicine

## 2019-12-10 DIAGNOSIS — I890 Lymphedema, not elsewhere classified: Secondary | ICD-10-CM | POA: Insufficient documentation

## 2019-12-10 DIAGNOSIS — L89623 Pressure ulcer of left heel, stage 3: Secondary | ICD-10-CM | POA: Diagnosis not present

## 2019-12-10 DIAGNOSIS — L97518 Non-pressure chronic ulcer of other part of right foot with other specified severity: Secondary | ICD-10-CM | POA: Diagnosis not present

## 2019-12-10 DIAGNOSIS — E11622 Type 2 diabetes mellitus with other skin ulcer: Secondary | ICD-10-CM | POA: Diagnosis not present

## 2019-12-10 DIAGNOSIS — W132XXD Fall from, out of or through roof, subsequent encounter: Secondary | ICD-10-CM | POA: Insufficient documentation

## 2019-12-10 DIAGNOSIS — I872 Venous insufficiency (chronic) (peripheral): Secondary | ICD-10-CM | POA: Insufficient documentation

## 2019-12-10 DIAGNOSIS — E11621 Type 2 diabetes mellitus with foot ulcer: Secondary | ICD-10-CM | POA: Diagnosis not present

## 2019-12-10 DIAGNOSIS — L97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: Secondary | ICD-10-CM | POA: Diagnosis not present

## 2019-12-10 DIAGNOSIS — L89899 Pressure ulcer of other site, unspecified stage: Secondary | ICD-10-CM | POA: Diagnosis present

## 2019-12-10 DIAGNOSIS — L89613 Pressure ulcer of right heel, stage 3: Secondary | ICD-10-CM | POA: Insufficient documentation

## 2019-12-10 DIAGNOSIS — L97528 Non-pressure chronic ulcer of other part of left foot with other specified severity: Secondary | ICD-10-CM | POA: Diagnosis not present

## 2019-12-10 DIAGNOSIS — G8221 Paraplegia, complete: Secondary | ICD-10-CM | POA: Diagnosis not present

## 2019-12-13 NOTE — Progress Notes (Signed)
TONG, PIECZYNSKI ([id number]) Visit Report for 12/10/2019 HPI Details Patient Name: Date of Service: [name] 12/10/2019 1:45 PM Medical Record Number: [id number] Patient Account Number: [id number] Date of Birth/Sex: Treating RN: 04/16/1965 (54 y.o. [name] Primary Care Provider: Juluis Mire Other Clinician: Referring Provider: Treating Provider/Extender: Fleet Contras in Treatment: 10 History of Present Illness HPI Description: ADMISSION 10/02/2018 This is a 54 year old Spanish-speaking man who arrived accompanied by his wife. 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 Apparently 6 weeks ago his wife noted blisters on his bilateral fifth metatarsal heads and heels which she feels was from excessive pressure on the foot rests of his wheelchair. He was seen in the emergency room early in August and then was admitted to hospital from 8/10 through 8/14 with cellulitis of his feet. He was ultimately discharged on Keflex. Arterial studies were done in the hospital that showed an ABI of 1.61 on the right and 1.36 on the left but triphasic waveforms on the left and biphasic on the right. DVT rule out study was negative. X-ray of the bilateral feet did not show osteomyelitis. They have been dealing with these wounds at home. I think they are using Betadine. He has Medicaid and does not have home health. He comes in today with wounds on his bilateral plantar fifth metatarsal heads Achilles area of both heels and an area on the left lateral leg. His wife explains this as he always laterally rotates his legs when he is sitting in the wheelchair or in bed. She even seemed to believe that before his injury he actually walked on the outside of his feet. 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  Paraplegia T1-T6 8/27; the patient has 4 open wounds mirror-image areas over the plantar fifth metatarsal heads. Area on the right Achilles area and then on the left lateral calf. We have been using Iodoflex. He is on Eliquis 9/2 debridement I did last week caused copious amounts of bleeding which was difficult to control. He is on Eliquis. He has the 2 mirror-image wounds over the plantar fifth metatarsal heads in the area on the right Achilles and then an area on the left lateral calf. Both the fifth metatarsal head wounds on the left lateral calf of necrotic eschar on the surface. Black discoloration likely partially the silver nitrate we had to use last time 9/9; he arrives back in clinic today with the wounds on the plantar fifth metatarsal heads and exactly the same nonviable situation. He also has an area on the Achilles part of his right heel and the left lateral leg in a very similar situation. Nothing is making much progress I took some time to review his arterial studies from his hospitalization before he came to this clinic. On the right he had noncompressible ABIs with biphasic waveforms. They did not do a TBI in the right. On the left he had noncompressible ABIs at 1.36. However his TBI was 0.70 with triphasic waveforms at the dorsalis pedis he had a DVT rule out study that was negative. We have been using Iodoflex without much progress back in compression as he does not have access to wound care supplies 9/17; he comes back in with the same adherent eschar over both fifth met heads. I am really uncertain about this.. I used Iodoflex and  last week switch to Sorbact. The area on the left posterior calf still with adherent debris. The right heel looked better 9/24 unfortunately his interpreter had to leave before we got in the room. Not much change. The right fifth met head is better however the left is not much improved we have been using Iodoflex 10/1; he has the mirror-image wounds on  the firth metatarsal head. The area on the right looks better the area on the left still copious amounts of necrotic material which is probably subcutaneous and muscle. This is deep but I was able to get this down to a healthy surface albeit with an extensive debridement. We have been using Iodoflex. The area on the right heel looks like it is contracting. The patient has significant chronic venous insufficiency and lymphedema. I have been keeping him meaning in compression for this reason and also this allows [place] to keep the dressings on all week. The patient does not have insurance 10/8; Bilateral 5th met head wounds likely pressure in the setting of paraplegia. Also right posterior right heel and left lat calf. Making progress with Iodoflex 10/14; we continue to make good progress with the surface of the wounds over the fifth metatarsal heads these are deep punched out pressure ulcers in the setting of diabetes and paraplegia. Also the area on the left lateral calf. We have had continued improvement in the right heel wound. I change the primary dressing to silver collagen today I am hoping to stimulate some granulation here. We finally have the surfaces of the wounds commensurate with that goal 10/22; not much improvement in fact the area on the right fifth met head needed debridement today which it had in last week. This is more shallow and it does have rims of epithelialization. Area on the left fifth met head is about the same left lateral leg is necrotic black covering. The area on the right Achilles heel is much better 10/29; both fifth met heads look better today filling in. Some debris on the surface but generally a lot better. The area on the right posterior Achilles is just about closed. Left lateral leg is still odd looking. This is mostly filled in. We have been using silver collagen Electronic Signature(s) Signed: 12/13/2019 2:10:44 PM By: Linton Ham MD Entered By: Linton Ham  on 12/10/2019 15:34:24 -------------------------------------------------------------------------------- Physical Exam Details Patient Name: Date of Service: Corwin Levins, [name] 12/10/2019 1:45 PM Medical Record Number: [id number] Patient Account Number: [id number] Date of Birth/Sex: Treating RN: 1965/06/24 (54 y.o. [name] Primary Care Provider: Juluis Mire Other Clinician: Referring Provider: Treating Provider/Extender: Fleet Contras in Treatment: 10 Cardiovascular Pedal pulses are palpable. Edema control is quite good bilaterally. Notes Wound exam; punched-out areas on the right fifth and left fifth met heads are closing in. No debridement. The area on the right Achilles is just about closed The area on the left lateral calf is beginning to surface again. I initially interpreted this as a pressure ulcer as well although the area is a bit strange looking to me and I wondered about a skin issue here. Electronic Signature(s) Signed: 12/13/2019 2:10:44 PM By: Linton Ham MD Entered By: Linton Ham on 12/10/2019 15:38:18 -------------------------------------------------------------------------------- Physician Orders Details Patient Name: Date of Service: Corwin Levins, [name] 12/10/2019 1:45 PM Medical Record Number: [id number] Patient Account Number: [id number] Date of Birth/Sex: Treating RN: 09-15-1965 (54 y.o. [name] Primary Care Provider: Juluis Mire Other Clinician: Referring Provider: Treating Provider/Extender: Dellia Nims  Melanie Crazier, Sharyn Lull Weeks in Treatment: 10 Verbal / Phone Orders: No Diagnosis Coding ICD-10 Coding Code Description L97.518 Non-pressure chronic ulcer of other part of right foot with other specified severity L97.528 Non-pressure chronic ulcer of other part of left foot with other specified severity L89.613 Pressure ulcer of right heel, stage 3 L89.623 Pressure ulcer of left heel, stage  3 E11.621 Type 2 diabetes mellitus with foot ulcer L97.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 G82.21 Paraplegia, complete Follow-up Appointments Return Appointment in 1 week. Dressing Change Frequency Do not change entire dressing for one week. - all wounds Skin Barriers/Peri-Wound Care Moisturizing lotion - both legs. Wound Cleansing May shower with protection. - Dressing cannot get wet. Use cast protector if taking a shower Primary Wound Dressing Wound #1 Right Metatarsal head fifth Silver Collagen - moisten with hydrogel. Wound #3 Left Metatarsal head fifth Silver Collagen - moisten with hydrogel. Wound #4 Left,Lateral Lower Leg Silver Collagen - moisten with hydrogel. Secondary Dressing Wound #1 Right Metatarsal head fifth Dry Gauze ABD pad - both metatarsal heads to offer additional offloading Drawtex - cut to fit inside wound margins to hold collagen in place Wound #3 Left Metatarsal head fifth Dry Gauze ABD pad - both metatarsal heads to offer additional offloading Drawtex - cut to fit inside wound margins to hold collagen in place Wound #4 Left,Lateral Lower Leg Dry Gauze Edema Control 4 layer compression - Bilateral Elevate legs to the level of the heart or above for 30 minutes daily and/or when sitting, a frequency of: - throughout the day Off-Loading Other: - avoid pressure to feet Electronic Signature(s) Signed: 12/10/2019 4:23:42 PM By: Baruch Gouty RN, BSN Signed: 12/13/2019 2:10:44 PM By: Linton Ham MD Entered By: Baruch Gouty on 12/10/2019 14:55:15 -------------------------------------------------------------------------------- Problem List Details Patient Name: Date of Service: Corwin Levins, [name] 12/10/2019 1:45 PM Medical Record Number: [id number] Patient Account Number: [id number] Date of Birth/Sex: Treating RN: [birth year] (54 y.o. [name] Primary Care Provider: Juluis Mire Other  Clinician: Referring Provider: Treating Provider/Extender: Fleet Contras in Treatment: 10 Active Problems ICD-10 Encounter Code Description Active Date MDM Diagnosis L97.518 Non-pressure chronic ulcer of other part of right foot with other specified 10/01/2019 No Yes severity L97.528 Non-pressure chronic ulcer of other part of left foot with other specified 10/01/2019 No Yes severity L89.613 Pressure ulcer of right heel, stage 3 10/01/2019 No Yes L89.623 Pressure ulcer of left heel, stage 3 10/01/2019 No Yes E11.621 Type 2 diabetes mellitus with foot ulcer 10/01/2019 No Yes L97.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 10/01/2019 No Yes G82.21 Paraplegia, complete 10/01/2019 No Yes Inactive Problems Resolved Problems Electronic Signature(s) Signed: 12/13/2019 2:10:44 PM By: Linton Ham MD Entered By: Linton Ham on 12/10/2019 15:33:17 -------------------------------------------------------------------------------- Progress Note Details Patient Name: Date of Service: Corwin Levins, [name] 12/10/2019 1:45 PM Medical Record Number: [id number] Patient Account Number: [id number] Date of Birth/Sex: Treating RN: March 23, 1965 (54 y.o. [name] Primary Care Provider: Juluis Mire Other Clinician: Referring Provider: Treating Provider/Extender: Fleet Contras in Treatment: 10 Subjective History of Present Illness (HPI) ADMISSION 10/02/2018 This is a 54 year old Spanish-speaking man who arrived accompanied by his wife. 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 Apparently 6 weeks ago his wife noted blisters on his bilateral fifth metatarsal heads and heels which she feels was from excessive pressure on the foot  rests of his wheelchair. He was seen in the emergency room early in August and then was admitted to  hospital from 8/10 through 8/14 with cellulitis of his feet. He was ultimately discharged on Keflex. Arterial studies were done in the hospital that showed an ABI of 1.61 on the right and 1.36 on the left but triphasic waveforms on the left and biphasic on the right. DVT rule out study was negative. X-ray of the bilateral feet did not show osteomyelitis. They have been dealing with these wounds at home. I think they are using Betadine. He has Medicaid and does not have home health. He comes in today with wounds on his bilateral plantar fifth metatarsal heads Achilles area of both heels and an area on the left lateral leg. His wife explains this as he always laterally rotates his legs when he is sitting in the wheelchair or in bed. She even seemed to believe that before his injury he actually walked on the outside of his feet. 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 Paraplegia T1-T6 8/27; the patient has 4 open wounds mirror-image areas over the plantar fifth metatarsal heads. Area on the right Achilles area and then on the left lateral calf. We have been using Iodoflex. He is on Eliquis 9/2 debridement I did last week caused copious amounts of bleeding which was difficult to control. He is on Eliquis. He has the 2 mirror-image wounds over the plantar fifth metatarsal heads in the area on the right Achilles and then an area on the left lateral calf. Both the fifth metatarsal head wounds on the left lateral calf of necrotic eschar on the surface. Black discoloration likely partially the silver nitrate we had to use last time 9/9; he arrives back in clinic today with the wounds on the plantar fifth metatarsal heads and exactly the same nonviable situation. He also has an area on the Achilles part of his right heel and the left lateral leg in a very similar situation. Nothing is making much progress I took some time to review his  arterial studies from his hospitalization before he came to this clinic. On the right he had noncompressible ABIs with biphasic waveforms. They did not do a TBI in the right. On the left he had noncompressible ABIs at 1.36. However his TBI was 0.70 with triphasic waveforms at the dorsalis pedis he had a DVT rule out study that was negative. We have been using Iodoflex without much progress back in compression as he does not have access to wound care supplies 9/17; he comes back in with the same adherent eschar over both fifth met heads. I am really uncertain about this.. I used Iodoflex and last week switch to Sorbact. The area on the left posterior calf still with adherent debris. The right heel looked better 9/24 unfortunately his interpreter had to leave before we got in the room. Not much change. The right fifth met head is better however the left is not much improved we have been using Iodoflex 10/1; he has the mirror-image wounds on the firth metatarsal head. The area on the right looks better the area on the left still copious amounts of necrotic material which is probably subcutaneous and muscle. This is deep but I was able to get this down to a healthy surface albeit with an extensive debridement. We have been using Iodoflex. The area on the right heel looks like it  is contracting. The patient has significant chronic venous insufficiency and lymphedema. I have been keeping him meaning in compression for this reason and also this allows [place] to keep the dressings on all week. The patient does not have insurance 10/8; Bilateral 5th met head wounds likely pressure in the setting of paraplegia. Also right posterior right heel and left lat calf. Making progress with Iodoflex 10/14; we continue to make good progress with the surface of the wounds over the fifth metatarsal heads these are deep punched out pressure ulcers in the setting of diabetes and paraplegia. Also the area on the left lateral  calf. We have had continued improvement in the right heel wound. I change the primary dressing to silver collagen today I am hoping to stimulate some granulation here. We finally have the surfaces of the wounds commensurate with that goal 10/22; not much improvement in fact the area on the right fifth met head needed debridement today which it had in last week. This is more shallow and it does have rims of epithelialization. Area on the left fifth met head is about the same left lateral leg is necrotic black covering. The area on the right Achilles heel is much better 10/29; both fifth met heads look better today filling in. Some debris on the surface but generally a lot better. The area on the right posterior Achilles is just about closed. Left lateral leg is still odd looking. This is mostly filled in. We have been using silver collagen Objective Constitutional Vitals Time Taken: 2:03 PM, Height: 68 in, Weight: 235 lbs, BMI: 35.7, Temperature: 98.4 F, Pulse: 76 bpm, Respiratory Rate: 18 breaths/min, Blood Pressure: 131/85 mmHg. Cardiovascular Pedal pulses are palpable. Edema control is quite good bilaterally. General Notes: Wound exam; punched-out areas on the right fifth and left fifth met heads are closing in. No debridement. ooThe area on the right Achilles is just about closed ooThe area on the left lateral calf is beginning to surface again. I initially interpreted this as a pressure ulcer as well although the area is a bit strange looking to me and I wondered about a skin issue here. Integumentary (Hair, Skin) Wound #1 status is Open. Original cause of wound was Gradually Appeared. The wound is located on the Right Metatarsal head fifth. The wound measures 2.6cm length x 1cm width x 0.3cm depth; 2.042cm^2 area and 0.613cm^3 volume. There is tendon and Fat Layer (Subcutaneous Tissue) exposed. There is no tunneling or undermining noted. There is a medium amount of serosanguineous  drainage noted. The wound margin is distinct with the outline attached to the wound base. There is large (67-100%) red, pink granulation within the wound bed. There is a small (1-33%) amount of necrotic tissue within the wound bed including Adherent Slough. Wound #3 status is Open. Original cause of wound was Gradually Appeared. The wound is located on the Left Metatarsal head fifth. The wound measures 2.6cm length x 1.5cm width x 0.5cm depth; 3.063cm^2 area and 1.532cm^3 volume. There is muscle and Fat Layer (Subcutaneous Tissue) exposed. There is no tunneling or undermining noted. There is a medium amount of serosanguineous drainage noted. The wound margin is well defined and not attached to the wound base. There is large (67-100%) red granulation within the wound bed. There is a small (1-33%) amount of necrotic tissue within the wound bed including Adherent Slough. Wound #4 status is Open. Original cause of wound was Pressure Injury. The wound is located on the Left,Lateral Lower Leg. The wound measures  2cm length x 1.7cm width x 0.1cm depth; 2.67cm^2 area and 0.267cm^3 volume. There is Fat Layer (Subcutaneous Tissue) exposed. There is no tunneling or undermining noted. There is a medium amount of serosanguineous drainage noted. The wound margin is distinct with the outline attached to the wound base. There is small (1-33%) red, pink granulation within the wound bed. There is a large (67-100%) amount of necrotic tissue within the wound bed including Eschar. 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 There was a Four Layer Compression Therapy Procedure by Deon Pilling, RN. Post procedure Diagnosis Wound #1: Same as Pre-Procedure Wound #4 Pre-procedure diagnosis of Wound #4 is a Pressure Ulcer located on the Left,Lateral Lower Leg . There was a Four Layer Compression Therapy Procedure by Deon Pilling, RN. Post procedure Diagnosis Wound #4: Same as Pre-Procedure Plan Follow-up Appointments: Return Appointment in 1 week. Dressing Change Frequency: Do not change entire dressing for one week. - all wounds Skin Barriers/Peri-Wound Care: Moisturizing lotion - both legs. Wound Cleansing: May shower with protection. - Dressing cannot get wet. Use cast protector if taking a shower Primary Wound Dressing: Wound #1 Right Metatarsal head fifth: Silver Collagen - moisten with hydrogel. Wound #3 Left Metatarsal head fifth: Silver Collagen - moisten with hydrogel. Wound #4 Left,Lateral Lower Leg: Silver Collagen - moisten with hydrogel. Secondary Dressing: Wound #1 Right Metatarsal head fifth: Dry Gauze ABD pad - both metatarsal heads to offer additional offloading Drawtex - cut to fit inside wound margins to hold collagen in place Wound #3 Left Metatarsal head fifth: Dry Gauze ABD pad - both metatarsal heads to offer additional offloading Drawtex - cut to fit inside wound margins to hold collagen in place Wound #4 Left,Lateral Lower Leg: Dry Gauze Edema Control: 4 layer compression - Bilateral Elevate legs to the level of the heart or above for 30 minutes daily and/or when sitting, a frequency of: - throughout the day Off-Loading: Other: - avoid pressure to feet 1. I continued silver collagen to all wound areas were making good progress on the bilateral fifth met heads, the right Achilles area is just above closed 2. I am not so certain about the area on the left lateral calf. I  wonder if this is going to need to be biopsied although I am going to watch this for another week or 2 Electronic Signature(s) Signed: 12/13/2019 2:10:44 PM By: Linton Ham MD Entered By: Linton Ham on 12/10/2019 15:39:05 -------------------------------------------------------------------------------- SuperBill Details Patient Name: Date of Service: Corwin Levins, Drew S 12/10/2019 Medical Record Number: [id number] Patient Account Number: [id number] Date of Birth/Sex: Treating RN: Mar 20, 1965 (54 y.o. [name] Primary Care Provider: Juluis Mire Other Clinician: Referring Provider: Treating Provider/Extender: Fleet Contras in Treatment: 10 Diagnosis Coding ICD-10 Codes Code Description [phone] Non-pressure chronic ulcer of other part of right foot with other specified severity L97.528 Non-pressure chronic ulcer of other part of left foot with other specified severity L89.613 Pressure ulcer of right heel, stage 3 L89.623 Pressure ulcer of left heel, stage 3 E11.621 Type 2 diabetes  mellitus with foot ulcer L97.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 G82.21 Paraplegia, complete Facility Procedures Physician Procedures : CPT4 Code Description Modifier [id number] - WC PHYS LEVEL 3 - EST PT ICD-10 Diagnosis Description L97.518 Non-pressure chronic ulcer of other part of right foot with other specified severity L97.528 Non-pressure chronic ulcer of other part of left  foot with other specified severity L97.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

## 2019-12-13 NOTE — Progress Notes (Signed)
[name], [name] ([id number]) Visit Report for 12/10/2019 Arrival Information Details Patient Name: Date of Service: Harry ChurchFA [name] 12/10/2019 1:45 PM Medical Record Number: [id number] Patient Account Number: [id number] Date of Birth/Sex: Treating RN: 09/08/1965 (54 y.o. [name]) Boehlein, Linda Primary Care Corleen Otwell: Gwinda PasseEdwards, Michelle Other Clinician: Referring Makynlie Rossini: Treating Lailanie Hasley/Extender: Aurelio Brashobson, Michael Edwards, Michelle Weeks in Treatment: 10 Visit Information History Since Last Visit Added or deleted any medications: No Patient Arrived: Wheel Chair Any new allergies or adverse reactions: No Arrival Time: 14:03 Had a fall or experienced change in No Accompanied By: wife activities of daily living that may affect Transfer Assistance: None risk of falls: Patient Identification Verified: Yes Signs or symptoms of abuse/neglect since last visito No Secondary Verification Process Completed: Yes Hospitalized since last visit: No Patient Requires Transmission-Based Precautions: No Implantable device outside of the clinic excluding No Patient Has Alerts: Yes cellular tissue based products placed in the center Patient Alerts: Translator Required since last visit: right ABI 1.61 Has Dressing in Place as Prescribed: Yes Left ABI 1.36 Pain Present Now: No Electronic Signature(s) Signed: 12/13/2019 8:21:27 AM By: Karl Itoawkins, Destiny Entered By: Karl Itoawkins, Destiny on 12/10/2019 14:03:53 -------------------------------------------------------------------------------- Compression Therapy Details Patient Name: Date of Service: Harry BirchwoodFA [name] 12/10/2019 1:45 PM Medical Record Number: [id number] Patient Account Number: [id number] Date of Birth/Sex: Treating RN: 09/08/1965 (54 y.o. [name]) Boehlein, Linda Primary Care Amario Longmore: Gwinda PasseEdwards, Michelle Other Clinician: Referring Avyan Livesay: Treating Raydon Chappuis/Extender: Aurelio Brashobson, Michael Edwards, Michelle Weeks in Treatment: 10 Compression Therapy  Performed for Wound Assessment: Wound #1 Right Metatarsal head fifth Performed By: Clinician Shawn Stalleaton, Bobbi, RN Compression Type: Four Layer Post Procedure Diagnosis Same as Pre-procedure Electronic Signature(s) Signed: 12/10/2019 4:23:42 PM By: Zenaida DeedBoehlein, Linda RN, BSN Entered By: Zenaida DeedBoehlein, Linda on 12/10/2019 14:53:37 -------------------------------------------------------------------------------- Compression Therapy Details Patient Name: Date of Service: Harry MoroFA [name] 12/10/2019 1:45 PM Medical Record Number: [id number] Patient Account Number: [id number] Date of Birth/Sex: Treating RN: 09/08/1965 (54 y.o. [name]) Boehlein, Linda Primary Care Lulani Bour: Gwinda PasseEdwards, Michelle Other Clinician: Referring Micha Dosanjh: Treating Jenina Moening/Extender: Aurelio Brashobson, Michael Edwards, Michelle Weeks in Treatment: 10 Compression Therapy Performed for Wound Assessment: Wound #4 Left,Lateral Lower Leg Performed By: Clinician Shawn Stalleaton, Bobbi, RN Compression Type: Four Layer Post Procedure Diagnosis Same as Pre-procedure Electronic Signature(s) Signed: 12/10/2019 4:23:42 PM By: Zenaida DeedBoehlein, Linda RN, BSN Entered By: Zenaida DeedBoehlein, Linda on 12/10/2019 14:53:37 -------------------------------------------------------------------------------- Lower Extremity Assessment Details Patient Name: Date of Service: Harry MoroFA [name] 12/10/2019 1:45 PM Medical Record Number: [id number] Patient Account Number: [id number] Date of Birth/Sex: Treating RN: 09/08/1965 (54 y.o. [name]) Deaton, Bobbi Primary Care Whittley Carandang: Gwinda PasseEdwards, Michelle Other Clinician: Referring Bethaney Oshana: Treating Jaziyah Gradel/Extender: Aurelio Brashobson, Michael Edwards, Michelle Weeks in Treatment: 10 Edema Assessment Assessed: Kyra Searles[Left: Yes] Franne Forts[Right: Yes] Edema: [Left: Yes] [Right: Yes] Calf Left: Right: Point of Measurement: From Medial Instep 45 cm 40.5 cm Ankle Left: Right: Point of Measurement: From Medial Instep 24 cm 25 cm Vascular Assessment Pulses: Dorsalis  Pedis Palpable: [Left:Yes] [Right:Yes] Electronic Signature(s) Signed: 12/10/2019 4:26:07 PM By: Shawn Stalleaton, Bobbi Entered By: Shawn Stalleaton, Bobbi on 12/10/2019 14:33:26 -------------------------------------------------------------------------------- Multi Wound Chart Details Patient Name: Date of Service: Harry MoroFA [place], [name] 12/10/2019 1:45 PM Medical Record Number: [id number] Patient Account Number: [id number] Date of Birth/Sex: Treating RN: 09/08/1965 (54 y.o. [name]) Boehlein, Linda Primary Care Aizlyn Schifano: Gwinda PasseEdwards, Michelle Other Clinician: Referring Johniece Hornbaker: Treating Kerin Kren/Extender: Aurelio Brashobson, Michael Edwards, Michelle Weeks in Treatment: 10 Vital Signs Height(in): 68 Pulse(bpm): 76 Weight(lbs): 235 Blood Pressure(mmHg): 131/85 Body Mass Index(BMI): 36 Temperature(F): 98.4 Respiratory Rate(breaths/min): 18 Photos: [1:No Photos Right  Metatarsal head fifth] [3:No Photos Left Metatarsal head fifth] [4:No Photos Left, Lateral Lower Leg] Wound Location: [1:Gradually Appeared] [3:Gradually Appeared] [4:Pressure Injury] Wounding Event: [1:Pressure Ulcer] [3:Pressure Ulcer] [4:Pressure Ulcer] Primary Etiology: [1:Hypotension, Type II Diabetes] [3:Hypotension, Type II Diabetes] [4:Hypotension, Type II Diabetes] Comorbid History: [1:08/12/2019] [3:08/12/2019] [4:08/12/2019] Date Acquired: [1:10] [3:10] [4:10] Weeks of Treatment: [1:Open] [3:Open] [4:Open] Wound Status: [1:2.6x1x0.3] [3:2.6x1.5x0.5] [4:2x1.7x0.1] Measurements L x W x D (cm) [1:2.042] [3:3.063] [4:2.67] A (cm) : rea [1:0.613] [3:1.532] [4:0.267] Volume (cm) : [1:52.20%] [3:45.80%] [4:9.30%] % Reduction in A rea: [1:-43.60%] [3:-171.20%] [4:9.50%] % Reduction in Volume: [1:Category/Stage IV] [3:Category/Stage IV] [4:Category/Stage III] Classification: [1:Medium] [3:Medium] [4:Medium] Exudate A mount: [1:Serosanguineous] [3:Serosanguineous] [4:Serosanguineous] Exudate Type: [1:red, brown] [3:red, brown] [4:red, brown] Exudate Color:  [1:Distinct, outline attached] [3:Well defined, not attached] [4:Distinct, outline attached] Wound Margin: [1:Large (67-100%)] [3:Large (67-100%)] [4:Small (1-33%)] Granulation A mount: [1:Red, Pink] [3:Red] [4:Red, Pink] Granulation Quality: [1:Small (1-33%)] [3:Small (1-33%)] [4:Large (67-100%)] Necrotic A mount: [1:Adherent Slough] [3:Adherent Slough] [4:Eschar] Necrotic Tissue: [1:Fat Layer (Subcutaneous Tissue): Yes Fat Layer (Subcutaneous Tissue): Yes Fat Layer (Subcutaneous Tissue): Yes] Exposed Structures: [1:Tendon: Yes Fascia: No Muscle: No Joint: No Bone: No Medium (34-66%)] [3:Muscle: Yes Fascia: No Tendon: No Joint: No Bone: No Medium (34-66%)] [4:Fascia: No Tendon: No Muscle: No Joint: No Bone: No Small (1-33%)] Epithelialization: [1:Compression Therapy] [3:N/A] [4:Compression Therapy] Treatment Notes Electronic Signature(s) Signed: 12/10/2019 4:23:42 PM By: Zenaida Deed RN, BSN Signed: 12/13/2019 2:10:44 PM By: Baltazar Najjar MD Entered By: Baltazar Najjar on 12/10/2019 15:33:23 -------------------------------------------------------------------------------- Multi-Disciplinary Care Plan Details Patient Name: Date of Service: [name], [name] 12/10/2019 1:45 PM Medical Record Number: [id number] Patient Account Number: [id number] Date of Birth/Sex: Treating RN: January 25, 1966 (54 y.o. [name] Primary Care Caylen Kuwahara: [name] Other Clinician: Referring Alanda Colton: Treating Kodee Drury/Extender: [name] in Treatment: 10 Active Inactive Nutrition Nursing Diagnoses: Impaired glucose control: actual or potential Goals: Patient/caregiver verbalizes understanding of need to maintain therapeutic glucose control per primary care physician Date Initiated: 10/01/2019 Target Resolution Date: 12/31/2019 Goal Status: Active Interventions: Provide education on elevated blood sugars and impact on wound healing Notes: Wound/Skin  Impairment Nursing Diagnoses: Impaired tissue integrity Goals: Ulcer/skin breakdown will have a volume reduction of 50% by week 8 Date Initiated: 10/01/2019 Date Inactivated: 12/03/2019 Target Resolution Date: 12/03/2019 Unmet Reason: paraplegia and Goal Status: Unmet offloading management Ulcer/skin breakdown will have a volume reduction of 80% by week 12 Date Initiated: 12/03/2019 Target Resolution Date: 12/31/2019 Goal Status: Active Interventions: Provide education on ulcer and skin care Notes: Electronic Signature(s) Signed: 12/10/2019 4:23:42 PM By: Zenaida Deed RN, BSN Entered By: Zenaida Deed on 12/10/2019 14:28:49 -------------------------------------------------------------------------------- Pain Assessment Details Patient Name: Date of Service: [name], [name] 12/10/2019 1:45 PM Medical Record Number: [id number] Patient Account Number: [id number] Date of Birth/Sex: Treating RN: Mar 29, 1965 (54 y.o. [name] Primary Care Kyre Jeffries: [name] Other Clinician: Referring Soniya Ashraf: Treating Simpson Paulos/Extender: [name] in Treatment: 10 Active Problems Location of Pain Severity and Description of Pain Patient Has Paino No Site Locations Pain Management and Medication Current Pain Management: Electronic Signature(s) Signed: 12/10/2019 4:23:42 PM By: Zenaida Deed RN, BSN Signed: 12/13/2019 8:21:27 AM By: Karl Ito Entered By: Karl Ito on 12/10/2019 14:04:13 -------------------------------------------------------------------------------- Patient/Caregiver Education Details Patient Name: Date of Service: [name] RLO S 10/29/2021andnbsp1:45 PM Medical Record Number: [id number] Patient Account Number: [id number] Date of Birth/Gender: Treating RN: May 28, 1965 (54 y.o. [name] Primary Care Physician: [name] Other Clinician: Referring Physician:  Treating  Physician/Extender: [name] in Treatment: 10 Education Assessment Education Provided To: Patient Education Topics Provided Offloading: Methods: Explain/Verbal Responses: Reinforcements needed, State content correctly Pressure: Methods: Explain/Verbal Responses: Reinforcements needed, State content correctly Wound/Skin Impairment: Methods: Explain/Verbal Responses: Reinforcements needed, State content correctly Electronic Signature(s) Signed: 12/10/2019 4:23:42 PM By: Zenaida Deed RN, BSN Entered By: Zenaida Deed on 12/10/2019 14:29:28 -------------------------------------------------------------------------------- Wound Assessment Details Patient Name: Date of Service: [name], [name] 12/10/2019 1:45 PM Medical Record Number: [id number] Patient Account Number: [id number] Date of Birth/Sex: Treating RN: 03-26-1965 (54 y.o. [name] Primary Care Hunter Bachar: [name] Other Clinician: Referring Derriana Oser: Treating Ramone Gander/Extender: [name] in Treatment: 10 Wound Status Wound Number: 1 Primary Etiology: Pressure Ulcer Wound Location: Right Metatarsal head fifth Wound Status: Open Wounding Event: Gradually Appeared Comorbid History: Hypotension, Type II Diabetes Date Acquired: 08/12/2019 Weeks Of Treatment: 10 Clustered Wound: No Wound Measurements Length: (cm) 2.6 Width: (cm) 1 Depth: (cm) 0.3 Area: (cm) 2.042 Volume: (cm) 0.613 % Reduction in Area: 52.2% % Reduction in Volume: -43.6% Epithelialization: Medium (34-66%) Tunneling: No Undermining: No Wound Description Classification: Category/Stage IV Wound Margin: Distinct, outline attached Exudate Amount: Medium Exudate Type: Serosanguineous Exudate Color: red, brown Foul Odor After Cleansing: No Slough/Fibrino Yes Wound Bed Granulation Amount: Large (67-100%) Exposed Structure Granulation Quality: Red, Pink Fascia  Exposed: No Necrotic Amount: Small (1-33%) Fat Layer (Subcutaneous Tissue) Exposed: Yes Necrotic Quality: Adherent Slough Tendon Exposed: Yes Muscle Exposed: No Joint Exposed: No Bone Exposed: No Electronic Signature(s) Signed: 12/10/2019 4:23:42 PM By: Zenaida Deed RN, BSN Signed: 12/10/2019 4:26:07 PM By: Shawn Stall Entered By: Shawn Stall on 12/10/2019 14:34:22 -------------------------------------------------------------------------------- Wound Assessment Details Patient Name: Date of Service: [name], [name] 12/10/2019 1:45 PM Medical Record Number: [id number] Patient Account Number: [id number] Date of Birth/Sex: Treating RN: [birth year] (54 y.o. [name] Primary Care Khamani Fairley: [name] Other Clinician: Referring Parks Czajkowski: Treating Gilman Olazabal/Extender: [name] in Treatment: 10 Wound Status Wound Number: 3 Primary Etiology: Pressure Ulcer Wound Location: Left Metatarsal head fifth Wound Status: Open Wounding Event: Gradually Appeared Comorbid History: Hypotension, Type II Diabetes Date Acquired: 08/12/2019 Weeks Of Treatment: 10 Clustered Wound: No Wound Measurements Length: (cm) 2.6 Width: (cm) 1.5 Depth: (cm) 0.5 Area: (cm) 3.063 Volume: (cm) 1.532 % Reduction in Area: 45.8% % Reduction in Volume: -171.2% Epithelialization: Medium (34-66%) Tunneling: No Undermining: No Wound Description Classification: Category/Stage IV Wound Margin: Well defined, not attached Exudate Amount: Medium Exudate Type: Serosanguineous Exudate Color: red, brown Foul Odor After Cleansing: No Slough/Fibrino Yes Wound Bed Granulation Amount: Large (67-100%) Exposed Structure Granulation Quality: Red Fascia Exposed: No Necrotic Amount: Small (1-33%) Fat Layer (Subcutaneous Tissue) Exposed: Yes Necrotic Quality: Adherent Slough Tendon Exposed: No Muscle Exposed: Yes Necrosis of Muscle: No Joint Exposed: No Bone  Exposed: No Electronic Signature(s) Signed: 12/10/2019 4:26:07 PM By: Shawn Stall Entered By: Shawn Stall on 12/10/2019 14:35:31 -------------------------------------------------------------------------------- Wound Assessment Details Patient Name: Date of Service: [name] 12/10/2019 1:45 PM Medical Record Number: [id number] Patient Account Number: [id number] Date of Birth/Sex: Treating RN: 1965/10/19 (54 y.o. [name] Primary Care Zienna Ahlin: [name] Other Clinician: Referring Darion Milewski: Treating Adam Demary/Extender: [name] in Treatment: 10 Wound Status Wound Number: 4 Primary Etiology: Pressure Ulcer Wound Location: Left, Lateral Lower Leg Wound Status: Open Wounding Event: Pressure Injury Comorbid History: Hypotension, Type II Diabetes Date Acquired: 08/12/2019 Weeks Of Treatment: 10 Clustered Wound: No Wound Measurements Length: (cm)  2 Width: (cm) 1.7 Depth: (cm) 0.1 Area: (cm) 2.67 Volume: (cm) 0.267 % Reduction in Area: 9.3% % Reduction in Volume: 9.5% Epithelialization: Small (1-33%) Tunneling: No Undermining: No Wound Description Classification: Category/Stage III Wound Margin: Distinct, outline attached Exudate Amount: Medium Exudate Type: Serosanguineous Exudate Color: red, brown Foul Odor After Cleansing: No Slough/Fibrino Yes Wound Bed Granulation Amount: Small (1-33%) Exposed Structure Granulation Quality: Red, Pink Fascia Exposed: No Necrotic Amount: Large (67-100%) Fat Layer (Subcutaneous Tissue) Exposed: Yes Necrotic Quality: Eschar Tendon Exposed: No Muscle Exposed: No Joint Exposed: No Bone Exposed: No Electronic Signature(s) Signed: 12/10/2019 4:23:42 PM By: Zenaida Deed RN, BSN Signed: 12/10/2019 4:26:07 PM By: Shawn Stall Entered By: Shawn Stall on 12/10/2019 14:36:34 -------------------------------------------------------------------------------- Vitals  Details Patient Name: Date of Service: [name], [name] 12/10/2019 1:45 PM Medical Record Number: [id number] Patient Account Number: [id number] Date of Birth/Sex: Treating RN: 1965/06/23 (54 y.o. [name] Primary Care Emet Rafanan: [name] Other Clinician: Referring Solenne Manwarren: Treating Flynn Gwyn/Extender: [name] in Treatment: 10 Vital Signs Time Taken: 14:03 Temperature (F): 98.4 Height (in): 68 Pulse (bpm): 76 Weight (lbs): 235 Respiratory Rate (breaths/min): 18 Body Mass Index (BMI): 35.7 Blood Pressure (mmHg): 131/85 Reference Range: 80 - 120 mg / dl Electronic Signature(s) Signed: 12/13/2019 8:21:27 AM By: Karl Ito Entered By: Karl Ito on 12/10/2019 14:04:08

## 2019-12-15 ENCOUNTER — Ambulatory Visit: Payer: Medicaid Other | Admitting: Occupational Therapy

## 2019-12-15 ENCOUNTER — Other Ambulatory Visit: Payer: Self-pay

## 2019-12-15 ENCOUNTER — Encounter: Payer: Self-pay | Admitting: Occupational Therapy

## 2019-12-15 ENCOUNTER — Ambulatory Visit: Payer: Medicaid Other | Attending: Physical Medicine and Rehabilitation

## 2019-12-15 DIAGNOSIS — M25632 Stiffness of left wrist, not elsewhere classified: Secondary | ICD-10-CM | POA: Diagnosis present

## 2019-12-15 DIAGNOSIS — R278 Other lack of coordination: Secondary | ICD-10-CM

## 2019-12-15 DIAGNOSIS — M25611 Stiffness of right shoulder, not elsewhere classified: Secondary | ICD-10-CM

## 2019-12-15 DIAGNOSIS — M79641 Pain in right hand: Secondary | ICD-10-CM | POA: Insufficient documentation

## 2019-12-15 DIAGNOSIS — M25631 Stiffness of right wrist, not elsewhere classified: Secondary | ICD-10-CM | POA: Insufficient documentation

## 2019-12-15 DIAGNOSIS — R29818 Other symptoms and signs involving the nervous system: Secondary | ICD-10-CM | POA: Insufficient documentation

## 2019-12-15 DIAGNOSIS — M25511 Pain in right shoulder: Secondary | ICD-10-CM | POA: Insufficient documentation

## 2019-12-15 DIAGNOSIS — R293 Abnormal posture: Secondary | ICD-10-CM | POA: Insufficient documentation

## 2019-12-15 DIAGNOSIS — M6281 Muscle weakness (generalized): Secondary | ICD-10-CM

## 2019-12-15 DIAGNOSIS — G8929 Other chronic pain: Secondary | ICD-10-CM | POA: Diagnosis present

## 2019-12-15 DIAGNOSIS — G8253 Quadriplegia, C5-C7 complete: Secondary | ICD-10-CM | POA: Diagnosis present

## 2019-12-15 DIAGNOSIS — M25612 Stiffness of left shoulder, not elsewhere classified: Secondary | ICD-10-CM | POA: Diagnosis present

## 2019-12-15 DIAGNOSIS — R208 Other disturbances of skin sensation: Secondary | ICD-10-CM | POA: Diagnosis present

## 2019-12-15 DIAGNOSIS — M25512 Pain in left shoulder: Secondary | ICD-10-CM | POA: Insufficient documentation

## 2019-12-15 DIAGNOSIS — M79642 Pain in left hand: Secondary | ICD-10-CM

## 2019-12-15 NOTE — Therapy (Signed)
[facility] [address] [place], [place], [address] Phone: [phone]   Fax:  [phone]  Physical Therapy Treatment  Patient Details  Name: Merlin Golden MRN: [id number] Date of Birth: August 02, 1965 Referring Provider (PT): Dr. Berline Chough   Encounter Date: 12/15/2019   PT End of Session - 12/15/19 1148    Visit Number 4    Number of Visits 9    Date for PT Re-Evaluation 01/10/20    Authorization Type [employer] Medicaid - 27 OT/PT/ST  No Submission needed at this time per    Authorization - Visit Number 4    Authorization - Number of Visits 27    PT Start Time 1145    PT Stop Time 1230    PT Time Calculation (min) 45 min    Equipment Utilized During Treatment Other (comment);Gait belt   slideboard   Activity Tolerance Patient tolerated treatment well    Behavior During Therapy WFL for tasks assessed/performed           Past Medical History:  Diagnosis Date  . Diabetes mellitus without complication (HCC)   . History of cervical fracture     Past Surgical History:  Procedure Laterality Date  . CERVICAL SPINE SURGERY      There were no vitals filed for this visit.   Subjective Assessment - 12/15/19 1148    Subjective Pt reports that he went to wound center last Friday and was told feet are doing some better.    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 DM2    Patient Stated Goals Pt wants to be able to do exercises so he can move more and not feel so tight.    Currently in Pain? No/denies                             [facility] Adult PT Treatment/Exercise - 12/15/19 1149      Bed Mobility   Sit to Supine Maximal Assistance - Patient 25-49%   at legs. 2nd  person CGA/min assist at trunk     Transfers   Transfers Lateral/Scoot Transfers    Lateral/Scoot Transfers 2: Max assist;From elevated surface;With slide board;With armrests removed    Lateral/Scoot Transfer Details (indicate cue type and reason) PT positioned slideboard with pt leaning away to help. Cued to lean away from direction he is going and use arms to help twist body to scoot over. PT student behind just for safety.      Neuro Re-ed    Neuro Re-ed Details  Sitting edge of mat with PT student behind pt on large physioball: Pt worked on reaching in varying directions. Less stable with reaching out to right. PT in front to block legs. Leaning back on large physioball about 20 degrees using arms to reach back to try to control descent and then reaching forward and pulling lightly on PT hands to come upright. Performed x 8.  Pt transitioned supine to long sit max assist +2. Pt able to assist with pulling on therapists arms like a bar to rise. In long sit position performed some self stretching of hamstrings holding to leg to provide some support with PT behind CGA. Then in long sit position performed seated  toss with purple ball with PT student with PT behind providing min/mod assist for stability x 2 min.      Exercises   Exercises Other Exercises;Knee/Hip      Knee/Hip Exercises: Aerobic   Other Aerobic Sci-Fit x 6 min level 2 with arms doing work but legs also positioned on pedals with PT assisting to keep left foot in place min assist and PT student on right leg CGA/supervision as well. Pt reported arms get tired towards end.                  PT Education - 12/15/19 1255    Education Details PT suggested having family see if they can anchor roap under foot of hospital bed with bar attached (like ski rope tow) to allow him to use hospital bed to raise himself up then pulling on rope to get fully upright and performing up/down in this position for arm strengthening as well as  getting more hamstring stretch.    Person(s) Educated Patient;Spouse    Methods Explanation    Comprehension Verbalized understanding            PT Short Term Goals - 12/15/19 1259      PT SHORT TERM GOAL #1   Title Pt and wife will be instructed in HEP for BLE stretching to ensure they are performing correctly. (due by 3rd visit)    Baseline PT has been reissued HEP for stretching and started performing with family. 12/15/19    Time 3    Period Weeks    Status Achieved    Target Date 12/15/19      PT SHORT TERM GOAL #2   Title Pt will be able to perform slideboard transfer max assist of 1 for improved mobility    Baseline Max assist +1 with second person still for safety 12/15/19    Time 3    Status Partially Met    Target Date 12/15/19      PT SHORT TERM GOAL #3   Title Pt will be instructed in ways to better manage swelling in legs and pressure relief to prevent further injury.    Baseline 12/15/19 Pt is wearing procare boots to offload heels to protect. Has been instructed in importance of elevating legs more to help with swelling.    Time 3    Period Weeks    Status Achieved    Target Date 12/15/19      PT SHORT TERM GOAL #4   Title Pt will be able to maintain long sit supervision x 2 min to assist with ADLs.    Baseline unable to assess at eval but limited HS length will challenge long sit. 12/15/19 Pt needs CGA/min assist to maintain long sit    Time 3    Period Weeks    Status Partially Met    Target Date 12/15/19             PT Long Term Goals - 11/16/19 2059      PT LONG TERM GOAL #1   Title Pt will be able to perform slideboard transfer min assist once board is placed for improved mobility.    Baseline max assist +2    Time 8    Period Weeks    Status New    Target Date 01/10/20      PT LONG TERM GOAL #2   Title Pt will transition from long sit to short sit min assist for improved mobility.  Baseline currently hoyer lift transfer at home.    Time 8     Period Weeks    Status New    Target Date 01/10/20      PT LONG TERM GOAL #3   Title Patient will perform long sit/circle without UE support >/= 5 minutes with min guard for improved ability to assist with ADLs in bed.    Baseline unable to assess at eval but has limited hamstring length that will affect long sitting balance.    Time 8    Period Weeks    Status New    Target Date 01/10/20      PT LONG TERM GOAL #4   Title Pt will perform rolling in bed mod assist for improved mobility.    Baseline max assist    Time 8    Period Weeks    Status New    Target Date 01/10/20                 Plan - 12/15/19 1257    Clinical Impression Statement Pt tolerated SciFit well with arms just fatiguing towards end. He needed slightly more assist with slideboard today after that. Focused remaining session more on trying to improve balance in short and long sit with using arms to support. He had his procare boots donned throughout session to protect heels. Pt is making progress towards STGs meeting 2/4 and close to the remaining 2 just needing slightly more assistance.    Personal Factors and Comorbidities Comorbidity 2    Comorbidities history of DVTs, DM2    Examination-Activity Limitations Bathing;Bed Mobility;Transfers;Dressing    Examination-Participation Restrictions Community Activity    Stability/Clinical Decision Making Evolving/Moderate complexity    Rehab Potential Good    PT Frequency 1x / week   plus eval   PT Duration 8 weeks    PT Treatment/Interventions ADLs/Self Care Home Management;Cryotherapy;DME Instruction;Moist Heat;Functional mobility training;Therapeutic activities;Therapeutic exercise;Balance training;Patient/family education;Neuromuscular re-education;Manual techniques;Wheelchair mobility training;Passive range of motion    PT Next Visit Plan SCI-FIT. I would hold on standing frame currently due to wounds and lack of proper footwear. Slideboard transfers, sitting  balance in short sit and long sit, bed mobility.    Consulted and Agree with Plan of Care Patient;Family member/caregiver    Family Member Consulted wife           Patient will benefit from skilled therapeutic intervention in order to improve the following deficits and impairments:  Decreased balance, Decreased mobility, Decreased knowledge of use of DME, Decreased activity tolerance, Decreased range of motion, Impaired flexibility, Decreased strength, Increased edema, Impaired sensation, Impaired tone, Impaired UE functional use  Visit Diagnosis: Muscle weakness (generalized)  Abnormal posture  Quadriplegia, C5-C7 complete ([place])     Problem List Patient Active Problem List   Diagnosis Date Noted  . At risk for unstable body temperature 11/26/2019  . Wheelchair dependence 11/26/2019  . Pressure ulcer 09/22/2019  . Cellulitis 09/22/2019  . Hypokalemia 09/22/2019  . Spasticity 03/08/2019  . Erectile dysfunction due to diseases classified elsewhere 03/08/2019  . 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 Neurogenic bowel 01/22/2019  . Neurogenic bladder 01/22/2019  . Myofascial pain 01/22/2019  . Spinal cord injury at T1-T6 level ([place]) 12/30/2018  . Quadriplegia ([place]) 12/30/2018    Electa Sniff, PT, DPT, NCS 12/15/2019, 1:03 PM  [facility] [address] [place], [place], [address] Phone: [phone]   Fax:  [phone]  Name: Viraj Liby MRN: [id number] Date  of Birth: 09/10/1965

## 2019-12-15 NOTE — Therapy (Signed)
[facility] [address] [place], [place], [address] Phone: [phone]   Fax:  [phone]  Occupational Therapy Treatment  Patient Details  Name: [name] MRN: [id number] Date of Birth: 08/10/1965 Referring Provider (OT): Dr. Berline Chough   Encounter Date: 12/15/2019   OT End of Session - 12/15/19 1244    Visit Number 3    Number of Visits 13    Date for OT Re-Evaluation 03/09/19    Authorization Type Medicaid    Authorization - Visit Number 3    Authorization - Number of Visits 12    OT Start Time 1233    OT Stop Time 1313    OT Time Calculation (min) 40 min    Activity Tolerance Patient tolerated treatment well    Behavior During Therapy [facility] for tasks assessed/performed           Past Medical History:  Diagnosis Date  . Diabetes mellitus without complication (HCC)   . History of cervical fracture     Past Surgical History:  Procedure Laterality Date  . CERVICAL SPINE SURGERY      There were no vitals filed for this visit.   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 DM    Limitations Fall Risk    Patient Stated Goals get better    Currently in Pain? Yes    Pain Score 3     Pain Location Wrist    Pain Orientation Right;Left    Pain Descriptors / Indicators Aching    Pain Onset More than a month ago    Pain Frequency Intermittent    Aggravating Factors  cold    Pain Relieving Factors movement              Treatment:Scapular retraction, with gentle stretch/ joint mobs to shoulders Supine Closed chain shoulder flexion, chest press with dowel, then with 8 lbs weight held between hands x 15 reps each exercise, min v.c Triceps  extension with left and right UE's individually with 5 lbs weight in hand x 15 reps, min v.c, clicking sound noted however pt denies pain. Butterfly stretch, x 15 reps min v.c Pt was able to assist with pulling up to long sit, total A +2 for safety, Sliding board transfer total A +2, pt requires v.c to use UE's to assist with transfers and balance when sitting.                   OT Short Term Goals - 11/19/19 1259      OT SHORT TERM GOAL #1   Title I with inital HEP-    Baseline dependent    Time 6    Period Weeks    Status New    Target Date 12/31/19      OT SHORT TERM GOAL #2   Title Pt will donn shirt with min A    Baseline mod A    Time 6    Period Weeks    Status On-going      OT SHORT TERM GOAL #3   Title Pt will report bilateral UE pain less than or equal to 4/10 during ADLS/ functional use.    Baseline bilateral UE pain 3-6/10  Time 6    Period Weeks    Status New      OT SHORT TERM GOAL #4   Title Pt will increase LUE grip strength to 17 lbs or greater for increased functional use.    Baseline RUE 30.4 lbs ,LUE 12.1 lbs    Time 6    Period Weeks    Status On-going   pt is awaiting new hospital bed     OT SHORT TERM GOAL #5   Title Pt will demonstrate improved LUE fine motor coordination for ADLs as evidenced by decreasing 9 hole peg test score to 80 secs or less.    Baseline RUE 34.22, LUE 1 min 31 secs    Time 6    Period Weeks    Status New   12/16/2018 in long sitting in clinic; pt's hospital bed is poor and does not allow pt to complete at home. Attempting to contact [employer] to determine if more suitable hospital bed can be issued. WIfe to call clinic with rep name     OT SHORT TERM GOAL #6   Title Pt will perform sliding board transfers with max A  for increased I with ADLs/ toilet transfers prn    Baseline toatl A +2 per PT report    Time 6    Period Weeks    Status New      OT SHORT TERM GOAL #7   Title Pt / family will verbalize  understanding of pain reduction strategies for UE's.    Baseline pain 3-6/10 for shoulders, hands, needs education    Time 6    Period Weeks    Status New             OT Long Term Goals - 11/19/19 1302      OT LONG TERM GOAL #1   Title Pt will donn shirt with setup    Baseline mod A    Time 12    Period Weeks    Status New      OT LONG TERM GOAL #2   Title Pt will demonstrate improved bilateral UE external rotation to [facility]  as evidenced by pt's  increased ease with supporting himself in long and circle sitting, with pain less than or equal to 3/10.    Baseline limited bilateral external rotation in shoulders, pain 3-6/10 in UE's    Time 12    Period Weeks    Status On-going   pt is awaitng new hospital bed     OT LONG TERM GOAL #3   Title Pt will demonstrate improved LUE fine motor coordination as evidenced  by decreasing 9 hole peg test score to 70 secs or less.    Baseline RUE 34.22, LUE 1 min 31 secs    Time 12    Period Weeks    Status New      OT LONG TERM GOAL #4   Title Pt/ family will verbalize understanding of adapted strategies to maximize pt's I with ADLs/ IADLs.    Baseline needs updates/ reinforcement of strategies as pt is not perfroming consistently.    Time 12    Period Weeks    Status New      OT LONG TERM GOAL #5   Title Pt will increase RUE grip to 36 lbs and LUE grip to 20 lbs or greater in prep for bilateral functional use.    Baseline RUE 30.4, LUE 12.1    Time 12    Period  Weeks    Status New   01/04/2019 R = 26 pounds, L = 3 pounds fro combined bilateral grip strength of 29 pounds (baseline combined 8 pounds)     OT LONG TERM GOAL #6   Title Pt will perform sliding board transfers to assist with ADLS and in prep for toilet transfers with mod A to level surface.    Baseline total A +2    Time 12    Period Weeks    Status New                 Plan - 12/15/19 1247    Clinical Impression Statement Pt is progressing towards goals for  increased UE strength and functional use for ADLS.    OT Occupational Profile and History Detailed Assessment- Review of Records and additional review of physical, cognitive, psychosocial history related to current functional performance    Occupational performance deficits (Please refer to evaluation for details): ADL's;IADL's;Work;Leisure;Social Participation;Rest and Sleep;Play    Body Structure / Function / Physical Skills ADL;UE functional use;Endurance;Balance;Flexibility;Pain;FMC;ROM;GMC;Coordination;Sensation;Decreased knowledge of precautions;Decreased knowledge of use of DME;IADL;Strength;Dexterity;Mobility;Tone    Rehab Potential Good    Clinical Decision Making Limited treatment options, no task modification necessary    Comorbidities Affecting Occupational Performance: May have comorbidities impacting occupational performance    Modification or Assistance to Complete Evaluation  No modification of tasks or assist necessary to complete eval    OT Frequency 1x / week   plus eval   OT Duration 12 weeks    OT Treatment/Interventions Self-care/ADL training;Ultrasound;Energy conservation;Aquatic Therapy;DME and/or AE instruction;Patient/family education;Paraffin;Gait Training;Passive range of motion;Balance training;Fluidtherapy;Cryotherapy;Splinting;Building services engineer;Contrast Bath;Electrical Stimulation;Moist Heat;Therapeutic exercise;Manual Therapy;Therapeutic activities;Cognitive remediation/compensation;Neuromuscular education    Plan sit edge of mat/ long sit if 2nd therapist available, trunk control, transfers , UE functional use, donn shirt    Consulted and Agree with Plan of Care Patient;Family member/caregiver    Family Member Consulted wife,interpreter           Patient will benefit from skilled therapeutic intervention in order to improve the following deficits and impairments:   Body Structure / Function / Physical Skills: ADL, UE functional use, Endurance, Balance,  Flexibility, Pain, FMC, ROM, GMC, Coordination, Sensation, Decreased knowledge of precautions, Decreased knowledge of use of DME, IADL, Strength, Dexterity, Mobility, Tone       Visit Diagnosis: Pain in left hand  Pain in right hand  Other lack of coordination  Stiffness of right shoulder, not elsewhere classified  Stiffness of left shoulder, not elsewhere classified  Stiffness of right wrist, not elsewhere classified  Muscle weakness (generalized)    Problem List Patient Active Problem List   Diagnosis Date Noted  . At risk for unstable body temperature 11/26/2019  . Wheelchair dependence 11/26/2019  . Pressure ulcer 09/22/2019  . Cellulitis 09/22/2019  . Hypokalemia 09/22/2019  . Spasticity 03/08/2019  . Erectile dysfunction due to diseases classified elsewhere 03/08/2019  . 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 Neurogenic bowel 01/22/2019  . Neurogenic bladder 01/22/2019  . Myofascial pain 01/22/2019  . 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 Quadriplegia (HCC) 12/30/2018    [name] 12/15/2019, 4:07 PM Keene Breath, OTR/L [phone] Phone: [phone] 4:14 PM 12/15/19 [facility] Out[facility] Rehabilitation [facility] [address] [place], [place], [address] Phone: [phone]   Fax:  [phone]  Name: [name] MRN: [id number] Date of Birth: Mar 14, 1965

## 2019-12-16 ENCOUNTER — Telehealth (INDEPENDENT_AMBULATORY_CARE_PROVIDER_SITE_OTHER): Payer: Self-pay | Admitting: Primary Care

## 2019-12-16 NOTE — Telephone Encounter (Signed)
Medication Refill - Medication: FLUoxetine (PROZAC) 20 MG capsule   Has the patient contacted their pharmacy? No. (Agent: If no, request that the patient contact the pharmacy for the refill.) (Agent: If yes, when and what did the pharmacy advise?)  Preferred Pharmacy (with phone number or street name):  [facility] - [place], [place] - [address] Phone:  [phone]  Fax:  [phone]       Agent: Please be advised that RX refills may take up to 3 business days. We ask that you follow-up with your pharmacy.

## 2019-12-16 NOTE — Telephone Encounter (Signed)
See request below

## 2019-12-16 NOTE — Telephone Encounter (Signed)
Sent to PCP ?

## 2019-12-17 ENCOUNTER — Other Ambulatory Visit: Payer: Self-pay

## 2019-12-17 ENCOUNTER — Encounter (HOSPITAL_BASED_OUTPATIENT_CLINIC_OR_DEPARTMENT_OTHER): Payer: Medicaid Other | Attending: Internal Medicine | Admitting: Internal Medicine

## 2019-12-17 DIAGNOSIS — E11621 Type 2 diabetes mellitus with foot ulcer: Secondary | ICD-10-CM | POA: Insufficient documentation

## 2019-12-17 DIAGNOSIS — L89613 Pressure ulcer of right heel, stage 3: Secondary | ICD-10-CM | POA: Diagnosis not present

## 2019-12-17 DIAGNOSIS — L97528 Non-pressure chronic ulcer of other part of left foot with other specified severity: Secondary | ICD-10-CM | POA: Insufficient documentation

## 2019-12-17 DIAGNOSIS — G8221 Paraplegia, complete: Secondary | ICD-10-CM | POA: Insufficient documentation

## 2019-12-17 DIAGNOSIS — L97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: Secondary | ICD-10-CM | POA: Insufficient documentation

## 2019-12-17 DIAGNOSIS — L97518 Non-pressure chronic ulcer of other part of right foot with other specified severity: Secondary | ICD-10-CM | POA: Insufficient documentation

## 2019-12-17 DIAGNOSIS — L89623 Pressure ulcer of left heel, stage 3: Secondary | ICD-10-CM | POA: Diagnosis not present

## 2019-12-17 NOTE — Telephone Encounter (Signed)
Pt called about refill again/ please advise

## 2019-12-20 ENCOUNTER — Ambulatory Visit: Payer: Medicaid Other | Admitting: Physical Therapy

## 2019-12-20 ENCOUNTER — Ambulatory Visit: Payer: Medicaid Other | Admitting: Occupational Therapy

## 2019-12-20 NOTE — Progress Notes (Signed)
SUHAIB, GUZZO ([id number]) Visit Report for 12/17/2019 Debridement Details Patient Name: Date of Service: [name] 12/17/2019 3:30 PM Medical Record Number: [id number] Patient Account Number: [id number] Date of Birth/Sex: Treating RN: November 07, 1965 (54 y.o. [name] Primary Care Provider: Juluis Mire Other Clinician: Referring Provider: Treating Provider/Extender: Fleet Contras in Treatment: 11 Debridement Performed for Assessment: Wound #4 Left,Lateral Lower Leg Performed By: Physician Ricard Dillon., MD Debridement Type: Debridement Level of Consciousness (Pre-procedure): Awake and Alert Pre-procedure Verification/Time Out Yes - 16:40 Taken: Start Time: 16:40 T Area Debrided (L x W): otal 1.2 (cm) x 1 (cm) = 1.2 (cm) Tissue and other material debrided: Viable, Non-Viable, Slough, Subcutaneous, Slough Level: Skin/Subcutaneous Tissue Debridement Description: Excisional Instrument: Curette Bleeding: Minimum Hemostasis Achieved: Pressure End Time: 16:42 Procedural Pain: Insensate Post Procedural Pain: Insensate Response to Treatment: Procedure was tolerated well Level of Consciousness (Post- Awake and Alert procedure): Post Debridement Measurements of Total Wound Length: (cm) 1.2 Stage: Category/Stage III Width: (cm) 1 Depth: (cm) 0.1 Volume: (cm) 0.094 Character of Wound/Ulcer Post Debridement: Improved Post Procedure Diagnosis Same as Pre-procedure Electronic Signature(s) Signed: 12/17/2019 6:06:29 PM By: Baruch Gouty RN, BSN Signed: 12/20/2019 1:46:42 PM By: Linton Ham MD Entered By: Linton Ham on 12/17/2019 17:03:25 -------------------------------------------------------------------------------- HPI Details Patient Name: Date of Service: [name], [name] 12/17/2019 3:30 PM Medical Record Number: [id number] Patient Account Number: [id number] Date of Birth/Sex: Treating RN: December 03, 1965 (54 y.o. [name] Primary Care Provider: Juluis Mire Other Clinician: Referring Provider: Treating Provider/Extender: Fleet Contras in Treatment: 11 History of Present Illness HPI Description: ADMISSION 10/02/2018 This is a 54 year old Spanish-speaking man who arrived accompanied by his wife. 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 Apparently 6 weeks ago his wife noted blisters on his bilateral fifth metatarsal heads and heels which she feels was from excessive pressure on the foot rests of his wheelchair. He was seen in the emergency room early in August and then was admitted to hospital from 8/10 through 8/14 with cellulitis of his feet. He was ultimately discharged on Keflex. Arterial studies were done in the hospital that showed an ABI of 1.61 on the right and 1.36 on the left but triphasic waveforms on the left and biphasic on the right. DVT rule out study was negative. X-ray of the bilateral feet did not show osteomyelitis. They have been dealing with these wounds at home. I think they are using Betadine. He has Medicaid and does not have home health. He comes in today with wounds on his bilateral plantar fifth metatarsal heads Achilles area of both heels and an area on the left lateral leg. His wife explains this as he always laterally rotates his legs when he is sitting in the wheelchair or in bed. She even seemed to believe that before his injury he actually walked on the outside of his feet. 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 Paraplegia T1-T6 8/27; the patient has 4 open wounds mirror-image areas over the plantar fifth metatarsal heads. Area on the right Achilles area and then on the left lateral calf. We have been using Iodoflex. He is on Eliquis 9/2 debridement I did last week caused copious  amounts of bleeding which was difficult to control. He is on Eliquis. He has the 2 mirror-image wounds over the plantar  fifth metatarsal heads in the area on the right Achilles and then an area on the left lateral calf. Both the fifth metatarsal head wounds on the left lateral calf of necrotic eschar on the surface. Black discoloration likely partially the silver nitrate we had to use last time 9/9; he arrives back in clinic today with the wounds on the plantar fifth metatarsal heads and exactly the same nonviable situation. He also has an area on the Achilles part of his right heel and the left lateral leg in a very similar situation. Nothing is making much progress I took some time to review his arterial studies from his hospitalization before he came to this clinic. On the right he had noncompressible ABIs with biphasic waveforms. They did not do a TBI in the right. On the left he had noncompressible ABIs at 1.36. However his TBI was 0.70 with triphasic waveforms at the dorsalis pedis he had a DVT rule out study that was negative. We have been using Iodoflex without much progress back in compression as he does not have access to wound care supplies 9/17; he comes back in with the same adherent eschar over both fifth met heads. I am really uncertain about this.. I used Iodoflex and last week switch to Sorbact. The area on the left posterior calf still with adherent debris. The right heel looked better 9/24 unfortunately his interpreter had to leave before we got in the room. Not much change. The right fifth met head is better however the left is not much improved we have been using Iodoflex 10/1; he has the mirror-image wounds on the firth metatarsal head. The area on the right looks better the area on the left still copious amounts of necrotic material which is probably subcutaneous and muscle. This is deep but I was able to get this down to a healthy surface albeit with an extensive  debridement. We have been using Iodoflex. The area on the right heel looks like it is contracting. The patient has significant chronic venous insufficiency and lymphedema. I have been keeping him meaning in compression for this reason and also this allows [place] to keep the dressings on all week. The patient does not have insurance 10/8; Bilateral 5th met head wounds likely pressure in the setting of paraplegia. Also right posterior right heel and left lat calf. Making progress with Iodoflex 10/14; we continue to make good progress with the surface of the wounds over the fifth metatarsal heads these are deep punched out pressure ulcers in the setting of diabetes and paraplegia. Also the area on the left lateral calf. We have had continued improvement in the right heel wound. I change the primary dressing to silver collagen today I am hoping to stimulate some granulation here. We finally have the surfaces of the wounds commensurate with that goal 10/22; not much improvement in fact the area on the right fifth met head needed debridement today which it had in last week. This is more shallow and it does have rims of epithelialization. Area on the left fifth met head is about the same left lateral leg is necrotic black covering. The area on the right Achilles heel is much better 10/29; both fifth met heads look better today filling in. Some debris on the surface but generally a lot better. The area on the right posterior Achilles is just about closed. Left lateral leg is still odd looking. This is mostly filled in. We have been using silver collagen 11/5; both fifth metatarsal head  wounds look continually improved. They are filling in. The Achilles wound on the right is closed and remains closed. On the left lateral calf. Not so much improvement still eschar over this area. It is possible the POTUS boot somehow was rubbing on this area or one of the wraps of the POTUS boot. His wife says she puts these legs  on pillows to try and keep the pressure off nevertheless the area on the left lateral calf is been very recalcitrant Electronic Signature(s) Signed: 12/20/2019 1:46:42 PM By: Linton Ham MD Entered By: Linton Ham on 12/17/2019 17:04:29 -------------------------------------------------------------------------------- Physical Exam Details Patient Name: Date of Service: [name], [name] 12/17/2019 3:30 PM Medical Record Number: [id number] Patient Account Number: [id number] Date of Birth/Sex: Treating RN: Jun 24, 1965 (54 y.o. [name] Primary Care Provider: Juluis Mire Other Clinician: Referring Provider: Treating Provider/Extender: Fleet Contras in Treatment: 11 Constitutional Sitting or standing Blood Pressure is within target range for patient.. Pulse regular and within target range for patient.Marland Kitchen Respirations regular, non-labored and within target range.. Temperature is normal and within the target range for the patient.Marland Kitchen Appears in no distress. Notes Wound exam; punched-out area on the right fifth and left fifth metatarsal heads continue to have really nice improvement. The area on the Achilles on the right is closed Still very adherent eschar on the left lateral calf. I used a #5 curette to remove this hemostasis with direct pressure. I am puzzled as to why this wound epithelialized and more importantly wilate why it keeps on forming eschar Electronic Signature(s) Signed: 12/20/2019 1:46:42 PM By: Linton Ham MD Entered By: Linton Ham on 12/17/2019 17:05:38 -------------------------------------------------------------------------------- Physician Orders Details Patient Name: Date of Service: [name], [name] 12/17/2019 3:30 PM Medical Record Number: [id number] Patient Account Number: [id number] Date of Birth/Sex: Treating RN: 15-Jan-1966 (54 y.o. [name] Primary Care Provider: Juluis Mire Other  Clinician: Referring Provider: Treating Provider/Extender: Fleet Contras in Treatment: 11 Verbal / Phone Orders: No Diagnosis Coding ICD-10 Coding Code Description L97.518 Non-pressure chronic ulcer of other part of right foot with other specified severity L97.528 Non-pressure chronic ulcer of other part of left foot with other specified severity L89.613 Pressure ulcer of right heel, stage 3 L89.623 Pressure ulcer of left heel, stage 3 E11.621 Type 2 diabetes mellitus with foot ulcer L97.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 G82.21 Paraplegia, complete Follow-up Appointments Return Appointment in 1 week. Dressing Change Frequency Do not change entire dressing for one week. - all wounds Skin Barriers/Peri-Wound Care Moisturizing lotion - both legs. Wound Cleansing May shower with protection. - Dressing cannot get wet. Use cast protector if taking a shower Primary Wound Dressing Wound #1 Right Metatarsal head fifth Silver Collagen - moisten with hydrogel. Wound #3 Left Metatarsal head fifth Silver Collagen - moisten with hydrogel. Wound #4 Left,Lateral Lower Leg Silver Collagen - moisten with hydrogel. Secondary Dressing Wound #1 Right Metatarsal head fifth Dry Gauze ABD pad - both metatarsal heads to offer additional offloading Drawtex - cut to fit inside wound margins to hold collagen in place Wound #3 Left Metatarsal head fifth Dry Gauze ABD pad - both metatarsal heads to offer additional offloading Drawtex - cut to fit inside wound margins to hold collagen in place Wound #4 Left,Lateral Lower Leg Foam - foam donut Dry Gauze Edema Control 4 layer compression - Bilateral Elevate legs to the level of the heart or above for 30 minutes daily and/or when  sitting, a frequency of: - throughout the day Off-Loading Other: - avoid pressure to feet and left lateral leg Electronic Signature(s) Signed: 12/17/2019  6:06:29 PM By: Baruch Gouty RN, BSN Signed: 12/20/2019 1:46:42 PM By: Linton Ham MD Entered By: Baruch Gouty on 12/17/2019 16:46:59 -------------------------------------------------------------------------------- Problem List Details Patient Name: Date of Service: [name], [name] 12/17/2019 3:30 PM Medical Record Number: [id number] Patient Account Number: [id number] Date of Birth/Sex: Treating RN: 1965-11-16 (54 y.o. [name] Primary Care Provider: Juluis Mire Other Clinician: Referring Provider: Treating Provider/Extender: Fleet Contras in Treatment: 11 Active Problems ICD-10 Encounter Code Description Active Date MDM Diagnosis L97.518 Non-pressure chronic ulcer of other part of right foot with other specified 10/01/2019 No Yes severity L97.528 Non-pressure chronic ulcer of other part of left foot with other specified 10/01/2019 No Yes severity L89.613 Pressure ulcer of right heel, stage 3 10/01/2019 No Yes L89.623 Pressure ulcer of left heel, stage 3 10/01/2019 No Yes E11.621 Type 2 diabetes mellitus with foot ulcer 10/01/2019 No Yes L97.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 10/01/2019 No Yes G82.21 Paraplegia, complete 10/01/2019 No Yes Inactive Problems Resolved Problems Electronic Signature(s) Signed: 12/20/2019 1:46:42 PM By: Linton Ham MD Entered By: Linton Ham on 12/17/2019 17:02:55 -------------------------------------------------------------------------------- Progress Note Details Patient Name: Date of Service: [name], [name] 12/17/2019 3:30 PM Medical Record Number: [id number] Patient Account Number: [id number] Date of Birth/Sex: Treating RN: 14-Apr-1965 (54 y.o. [name] Primary Care Provider: Juluis Mire Other Clinician: Referring Provider: Treating Provider/Extender: Fleet Contras in Treatment: 11 Subjective History of  Present Illness (HPI) ADMISSION 10/02/2018 This is a 54 year old Spanish-speaking man who arrived accompanied by his wife. 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 Apparently 6 weeks ago his wife noted blisters on his bilateral fifth metatarsal heads and heels which she feels was from excessive pressure on the foot rests of his wheelchair. He was seen in the emergency room early in August and then was admitted to hospital from 8/10 through 8/14 with cellulitis of his feet. He was ultimately discharged on Keflex. Arterial studies were done in the hospital that showed an ABI of 1.61 on the right and 1.36 on the left but triphasic waveforms on the left and biphasic on the right. DVT rule out study was negative. X-ray of the bilateral feet did not show osteomyelitis. They have been dealing with these wounds at home. I think they are using Betadine. He has Medicaid and does not have home health. He comes in today with wounds on his bilateral plantar fifth metatarsal heads Achilles area of both heels and an area on the left lateral leg. His wife explains this as he always laterally rotates his legs when he is sitting in the wheelchair or in bed. She even seemed to believe that before his injury he actually walked on the outside of his feet. 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 Paraplegia T1-T6 8/27; the patient has 4 open wounds mirror-image areas over the plantar fifth metatarsal heads. Area on the right Achilles area and then on the left lateral calf. We have been using Iodoflex. He is on Eliquis 9/2 debridement I did last week caused copious amounts of bleeding which was difficult to control. He is on Eliquis. He has the 2 mirror-image wounds over the  plantar fifth metatarsal heads in the area on the right Achilles and then an area on the left  lateral calf. Both the fifth metatarsal head wounds on the left lateral calf of necrotic eschar on the surface. Black discoloration likely partially the silver nitrate we had to use last time 9/9; he arrives back in clinic today with the wounds on the plantar fifth metatarsal heads and exactly the same nonviable situation. He also has an area on the Achilles part of his right heel and the left lateral leg in a very similar situation. Nothing is making much progress I took some time to review his arterial studies from his hospitalization before he came to this clinic. On the right he had noncompressible ABIs with biphasic waveforms. They did not do a TBI in the right. On the left he had noncompressible ABIs at 1.36. However his TBI was 0.70 with triphasic waveforms at the dorsalis pedis he had a DVT rule out study that was negative. We have been using Iodoflex without much progress back in compression as he does not have access to wound care supplies 9/17; he comes back in with the same adherent eschar over both fifth met heads. I am really uncertain about this.. I used Iodoflex and last week switch to Sorbact. The area on the left posterior calf still with adherent debris. The right heel looked better 9/24 unfortunately his interpreter had to leave before we got in the room. Not much change. The right fifth met head is better however the left is not much improved we have been using Iodoflex 10/1; he has the mirror-image wounds on the firth metatarsal head. The area on the right looks better the area on the left still copious amounts of necrotic material which is probably subcutaneous and muscle. This is deep but I was able to get this down to a healthy surface albeit with an extensive debridement. We have been using Iodoflex. The area on the right heel looks like it is contracting. The patient has significant chronic venous insufficiency and lymphedema. I have been keeping him meaning in compression  for this reason and also this allows [place] to keep the dressings on all week. The patient does not have insurance 10/8; Bilateral 5th met head wounds likely pressure in the setting of paraplegia. Also right posterior right heel and left lat calf. Making progress with Iodoflex 10/14; we continue to make good progress with the surface of the wounds over the fifth metatarsal heads these are deep punched out pressure ulcers in the setting of diabetes and paraplegia. Also the area on the left lateral calf. We have had continued improvement in the right heel wound. I change the primary dressing to silver collagen today I am hoping to stimulate some granulation here. We finally have the surfaces of the wounds commensurate with that goal 10/22; not much improvement in fact the area on the right fifth met head needed debridement today which it had in last week. This is more shallow and it does have rims of epithelialization. Area on the left fifth met head is about the same left lateral leg is necrotic black covering. The area on the right Achilles heel is much better 10/29; both fifth met heads look better today filling in. Some debris on the surface but generally a lot better. The area on the right posterior Achilles is just about closed. Left lateral leg is still odd looking. This is mostly filled in. We have been using silver collagen 11/5; both fifth  metatarsal head wounds look continually improved. They are filling in. The Achilles wound on the right is closed and remains closed. On the left lateral calf. Not so much improvement still eschar over this area. It is possible the POTUS boot somehow was rubbing on this area or one of the wraps of the POTUS boot. His wife says she puts these legs on pillows to try and keep the pressure off nevertheless the area on the left lateral calf is been very recalcitrant Objective Constitutional Sitting or standing Blood Pressure is within target range for patient..  Pulse regular and within target range for patient.Marland Kitchen Respirations regular, non-labored and within target range.. Temperature is normal and within the target range for the patient.Marland Kitchen Appears in no distress. Vitals Time Taken: 3:57 PM, Height: 68 in, Weight: 235 lbs, BMI: 35.7, Temperature: 97.7 F, Pulse: 66 bpm, Respiratory Rate: 18 breaths/min, Blood Pressure: 131/77 mmHg. General Notes: Wound exam; punched-out area on the right fifth and left fifth metatarsal heads continue to have really nice improvement. ooThe area on the Achilles on the right is closed ooStill very adherent eschar on the left lateral calf. I used a #5 curette to remove this hemostasis with direct pressure. I am puzzled as to why this wound epithelialized and more importantly wilate why it keeps on forming eschar Integumentary (Hair, Skin) Wound #1 status is Open. Original cause of wound was Gradually Appeared. The wound is located on the Right Metatarsal head fifth. The wound measures 1.7cm length x 0.7cm width x 0.3cm depth; 0.935cm^2 area and 0.28cm^3 volume. There is tendon and Fat Layer (Subcutaneous Tissue) exposed. There is no tunneling or undermining noted. There is a medium amount of serosanguineous drainage noted. The wound margin is distinct with the outline attached to the wound base. There is large (67-100%) red, pink granulation within the wound bed. There is a small (1-33%) amount of necrotic tissue within the wound bed including Adherent Slough. Wound #3 status is Open. Original cause of wound was Gradually Appeared. The wound is located on the Left Metatarsal head fifth. The wound measures 1.8cm length x 1cm width x 0.5cm depth; 1.414cm^2 area and 0.707cm^3 volume. There is muscle and Fat Layer (Subcutaneous Tissue) exposed. There is no tunneling or undermining noted. There is a medium amount of serosanguineous drainage noted. The wound margin is well defined and not attached to the wound base. There is large  (67-100%) red granulation within the wound bed. There is a small (1-33%) amount of necrotic tissue within the wound bed including Adherent Slough. Wound #4 status is Open. Original cause of wound was Pressure Injury. The wound is located on the Left,Lateral Lower Leg. The wound measures 1.2cm length x 1cm width x 0.1cm depth; 0.942cm^2 area and 0.094cm^3 volume. There is Fat Layer (Subcutaneous Tissue) exposed. There is no tunneling or undermining noted. There is a medium amount of serosanguineous drainage noted. The wound margin is distinct with the outline attached to the wound base. There is small (1-33%) red, pink granulation within the wound bed. There is a large (67-100%) amount of necrotic tissue within the wound bed including Eschar. 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 There was a Excisional Skin/Subcutaneous Tissue Debridement with a total area of 1.2 sq cm performed by Ricard Dillon., MD. With the following instrument(s): Curette to remove Viable and Non-Viable tissue/material. Material removed includes Subcutaneous Tissue and Slough and. No specimens were taken. A time out was conducted at 16:40, prior to the start of the procedure. A Minimum amount of bleeding was controlled with Pressure. The procedure was tolerated well with a pain level of Insensate throughout and a pain level of Insensate following the procedure. Post Debridement Measurements: 1.2cm length x 1cm width x 0.1cm depth; 0.094cm^3  volume. Post debridement Stage noted as Category/Stage III. Character of Wound/Ulcer Post Debridement is improved. Post procedure Diagnosis Wound #4: Same as Pre-Procedure Pre-procedure diagnosis of Wound #4 is a Pressure Ulcer located on the Left,Lateral Lower Leg . There was a Four Layer Compression Therapy Procedure by Deon Pilling, RN. Post procedure Diagnosis Wound #4: Same as Pre-Procedure Wound #1 Pre-procedure diagnosis of Wound #1 is a Pressure Ulcer located on the Right Metatarsal head fifth . There was a Four Layer Compression Therapy Procedure by Deon Pilling, RN. Post procedure Diagnosis Wound #1: Same as Pre-Procedure Plan Follow-up Appointments: Return Appointment in 1 week. Dressing Change Frequency: Do not change entire dressing for one week. - all wounds Skin Barriers/Peri-Wound Care: Moisturizing lotion - both legs. Wound Cleansing: May shower with protection. - Dressing cannot get wet. Use cast protector if taking a shower Primary Wound Dressing: Wound #1 Right Metatarsal head fifth: Silver Collagen - moisten with hydrogel. Wound #3 Left Metatarsal head fifth: Silver Collagen - moisten with hydrogel. Wound #4 Left,Lateral Lower Leg: Silver Collagen - moisten with hydrogel. Secondary Dressing: Wound #1 Right Metatarsal head fifth: Dry Gauze ABD pad - both metatarsal heads to offer additional offloading Drawtex - cut to fit inside wound margins to hold collagen in place Wound #3 Left Metatarsal head fifth: Dry Gauze ABD pad - both metatarsal heads to offer additional offloading Drawtex - cut to fit inside wound margins to hold collagen in place Wound #4 Left,Lateral Lower Leg: Foam - foam donut Dry Gauze Edema Control: 4 layer compression - Bilateral Elevate legs to the level of the heart or above for 30 minutes daily and/or when sitting, a frequency of: - throughout the day Off-Loading: Other: - avoid pressure to feet and left lateral leg 1. I am still  continue with silver collagen to all wounds 2. Padding the left lateral calf 3. Still 4-layer compression bilaterally Electronic Signature(s) Signed: 12/20/2019 1:46:42 PM By: Linton Ham MD Entered By: Linton Ham on 12/17/2019 17:09:44 -------------------------------------------------------------------------------- SuperBill Details Patient Name: Date of Service: [name], [place] S 12/17/2019 Medical Record Number: [id number] Patient Account Number: [id number] Date of Birth/Sex: Treating RN: 08/27/1965 (54 y.o. 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, complete Facility Procedures CPT4 Code: [id number] Description: 88502 - DEB SUBQ TISSUE 20 SQ CM/< ICD-10 Diagnosis Description L97.228  Non-pressure chronic ulcer of left calf with other specified severity Modifier: Quantity: 1 CPT4 Code: 84033533 Description: (Facility Use Only) 17409LY - APPLY MULTLAY COMPRS LWR RT LEG Modifier: 59 Quantity: 1 Physician Procedures : CPT4 Code Description Modifier [id number] 11042 - WC PHYS SUBQ TISS 20 SQ CM ICD-10 Diagnosis Description L97.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 Quantity: 1 Electronic Signature(s) Signed: 12/20/2019 1:46:42 PM By: Linton Ham MD Entered By: Linton Ham on 12/17/2019 17:09:54

## 2019-12-20 NOTE — Progress Notes (Signed)
KOAH, CHISENHALL ([id number]) Visit Report for 12/17/2019 Arrival Information Details Patient Name: Date of Service: [name] 12/17/2019 3:30 PM Medical Record Number: [id number] Patient Account Number: [id number] Date of Birth/Sex: Treating RN: Aug 24, 1965 (54 y.o. [name] Primary Care Nikan Ellingson: [name] Other Clinician: Referring Arshawn Valdez: Treating Mindee Robledo/Extender: [name] in Treatment: 11 Visit Information History Since Last Visit Added or deleted any medications: No Patient Arrived: Wheel Chair Any new allergies or adverse reactions: No Arrival Time: 15:57 Had a fall or experienced change in No Accompanied By: wife activities of daily living that may affect Transfer Assistance: None risk of falls: Patient Identification Verified: Yes Signs or symptoms of abuse/neglect since last visito No Secondary Verification Process Completed: Yes Hospitalized since last visit: No Patient Requires Transmission-Based Precautions: No Implantable device outside of the clinic excluding No Patient Has Alerts: Yes cellular tissue based products placed in the center Patient Alerts: Translator Required since last visit: right ABI 1.61 Has Dressing in Place as Prescribed: Yes Left ABI 1.36 Pain Present Now: No Electronic Signature(s) Signed: 12/20/2019 7:40:36 AM By: Karl Ito Entered By: Karl Ito on 12/17/2019 15:57:34 -------------------------------------------------------------------------------- Compression Therapy Details Patient Name: Date of Service: [name], [name] 12/17/2019 3:30 PM Medical Record Number: [id number] Patient Account Number: [id number] Date of Birth/Sex: Treating RN: 01-18-1966 (54 y.o. [name] Primary Care Adriann Thau: [name] Other Clinician: Referring Babacar Haycraft: Treating Jacquelina Hewins/Extender: [name] in Treatment: 11 Compression Therapy  Performed for Wound Assessment: Wound #4 Left,Lateral Lower Leg Performed By: Clinician Shawn Stall, RN Compression Type: Four Layer Post Procedure Diagnosis Same as Pre-procedure Electronic Signature(s) Signed: 12/17/2019 6:06:29 PM By: Zenaida Deed RN, BSN Entered By: Zenaida Deed on 12/17/2019 16:44:45 -------------------------------------------------------------------------------- Compression Therapy Details Patient Name: Date of Service: [name], [name] 12/17/2019 3:30 PM Medical Record Number: [id number] Patient Account Number: [id number] Date of Birth/Sex: Treating RN: 1965/06/24 (54 y.o. [name] Primary Care Toneshia Coello: [name] Other Clinician: Referring Kanon Novosel: Treating Labria Wos/Extender: [name] in Treatment: 11 Compression Therapy Performed for Wound Assessment: Wound #1 Right Metatarsal head fifth Performed By: Clinician Shawn Stall, RN Compression Type: Four Layer Post Procedure Diagnosis Same as Pre-procedure Electronic Signature(s) Signed: 12/17/2019 6:06:29 PM By: Zenaida Deed RN, BSN Entered By: Zenaida Deed on 12/17/2019 16:44:45 -------------------------------------------------------------------------------- Encounter Discharge Information Details Patient Name: Date of Service: [name], [name] 12/17/2019 3:30 PM Medical Record Number: [id number] Patient Account Number: [id number] Date of Birth/Sex: Treating RN: 1965-03-01 (54 y.o. [name] Primary Care Selda Jalbert: [name] Other Clinician: Referring Jordi Lacko: Treating Brycin Kille/Extender: [name] in Treatment: 11 Encounter Discharge Information Items Post Procedure Vitals Discharge Condition: Stable Temperature (F): 97.7 Ambulatory Status: Wheelchair Pulse (bpm): 66 Discharge Destination: Home Respiratory Rate (breaths/min): 18 Transportation: Private Auto Blood Pressure (mmHg):  131/77 Accompanied By: wife and interpreter Schedule Follow-up Appointment: Yes Clinical Summary of Care: Electronic Signature(s) Signed: 12/17/2019 5:41:13 PM By: Shawn Stall Entered By: Shawn Stall on 12/17/2019 17:39:55 -------------------------------------------------------------------------------- Lower Extremity Assessment Details Patient Name: Date of Service: [name] 12/17/2019 3:30 PM Medical Record Number: [id number] Patient Account Number: [id number] Date of Birth/Sex: Treating RN: 22-Aug-1965 (54 y.o. [name] Primary Care Ilynn Stauffer: [name] Other Clinician: Referring Farron Watrous: Treating Rachit Grim/Extender: [name] in Treatment: 11 Edema Assessment Assessed: Kyra Searles: No] [Right: No] Edema: [Left: Yes] [Right: Yes] Calf Left: Right: Point of Measurement: From Medial Instep 42 cm 37  cm Ankle Left: Right: Point of Measurement: From Medial Instep 23 cm 23 cm Electronic Signature(s) Signed: 12/17/2019 5:00:24 PM By: Yevonne PaxEpps, Carrie RN Entered By: Yevonne PaxEpps, Carrie on 12/17/2019 16:18:25 -------------------------------------------------------------------------------- Multi Wound Chart Details Patient Name: Date of Service: [name], [name] 12/17/2019 3:30 PM Medical Record Number: [id number] Patient Account Number: [id number] Date of Birth/Sex: Treating RN: [birth year] (54 y.o. [name]) Boehlein, Linda Primary Care Gareld Obrecht: Gwinda PasseEdwards, Michelle Other Clinician: Referring Partick Musselman: Treating Param Capri/Extender: Aurelio Brashobson, Michael Edwards, Michelle Weeks in Treatment: 11 Vital Signs Height(in): 68 Pulse(bpm): 66 Weight(lbs): 235 Blood Pressure(mmHg): 131/77 Body Mass Index(BMI): 36 Temperature(F): 97.7 Respiratory Rate(breaths/min): 18 Photos: [1:No Photos Right Metatarsal head fifth] [3:No Photos Left Metatarsal head fifth] [4:No Photos Left, Lateral Lower Leg] Wound Location: [1:Gradually Appeared] [3:Gradually  Appeared] [4:Pressure Injury] Wounding Event: [1:Pressure Ulcer] [3:Pressure Ulcer] [4:Pressure Ulcer] Primary Etiology: [1:Hypotension, Type II Diabetes] [3:Hypotension, Type II Diabetes] [4:Hypotension, Type II Diabetes] Comorbid History: [1:08/12/2019] [3:08/12/2019] [4:08/12/2019] Date Acquired: [1:11] [3:11] [4:11] Weeks of Treatment: [1:Open] [3:Open] [4:Open] Wound Status: [1:1.7x0.7x0.3] [3:1.8x1x0.5] [4:1.2x1x0.1] Measurements L x W x D (cm) [1:0.935] [3:1.414] [4:0.942] A (cm) : rea [1:0.28] [3:0.707] [4:0.094] Volume (cm) : [1:78.10%] [3:75.00%] [4:68.00%] % Reduction in A rea: [1:34.40%] [3:-25.10%] [4:68.10%] % Reduction in Volume: [1:Category/Stage IV] [3:Category/Stage IV] [4:Category/Stage III] Classification: [1:Medium] [3:Medium] [4:Medium] Exudate A mount: [1:Serosanguineous] [3:Serosanguineous] [4:Serosanguineous] Exudate Type: [1:red, brown] [3:red, brown] [4:red, brown] Exudate Color: [1:Distinct, outline attached] [3:Well defined, not attached] [4:Distinct, outline attached] Wound Margin: [1:Large (67-100%)] [3:Large (67-100%)] [4:Small (1-33%)] Granulation A mount: [1:Red, Pink] [3:Red] [4:Red, Pink] Granulation Quality: [1:Small (1-33%)] [3:Small (1-33%)] [4:Large (67-100%)] Necrotic A mount: [1:Adherent Slough] [3:Adherent Slough] [4:Eschar] Necrotic Tissue: [1:Fat Layer (Subcutaneous Tissue): Yes Fat Layer (Subcutaneous Tissue): Yes Fat Layer (Subcutaneous Tissue): Yes] Exposed Structures: [1:Tendon: Yes Fascia: No Muscle: No Joint: No Bone: No Medium (34-66%)] [3:Muscle: Yes Fascia: No Tendon: No Joint: No Bone: No Medium (34-66%)] [4:Fascia: No Tendon: No Muscle: No Joint: No Bone: No Small (1-33%)] Epithelialization: [1:N/A] [3:N/A] [4:Debridement - Excisional] Debridement: Pre-procedure Verification/Time Out N/A [3:N/A] [4:16:40] Taken: [1:N/A] [3:N/A] [4:Subcutaneous, Slough] Tissue Debrided: [1:N/A] [3:N/A] [4:Skin/Subcutaneous Tissue] Level: [1:N/A] [3:N/A]  [4:1.2] Debridement A (sq cm): [1:rea N/A] [3:N/A] [4:Curette] Instrument: [1:N/A] [3:N/A] [4:Minimum] Bleeding: [1:N/A] [3:N/A] [4:Pressure] Hemostasis A chieved: [1:N/A] [3:N/A] [4:Insensate] Procedural Pain: [1:N/A] [3:N/A] [4:Insensate] Post Procedural Pain: [1:N/A] [3:N/A] [4:Procedure was tolerated well] Debridement Treatment Response: [1:N/A] [3:N/A] [4:1.2x1x0.1] Post Debridement Measurements L x W x D (cm) [1:N/A] [3:N/A] [4:0.094] Post Debridement Volume: (cm) [1:N/A] [3:N/A] [4:Category/Stage III] Post Debridement Stage: [1:Compression Therapy] [3:N/A] [4:Compression Therapy] Procedures Performed: [4:Debridement] Treatment Notes Electronic Signature(s) Signed: 12/17/2019 6:06:29 PM By: Zenaida DeedBoehlein, Linda RN, BSN Signed: 12/20/2019 1:46:42 PM By: Baltazar Najjarobson, Michael MD Entered By: Baltazar Najjarobson, Michael on 12/17/2019 17:03:15 -------------------------------------------------------------------------------- Multi-Disciplinary Care Plan Details Patient Name: Date of Service: [name], [name] 12/17/2019 3:30 PM Medical Record Number: [id number] Patient Account Number: [id number] Date of Birth/Sex: Treating RN: [birth year] (54 y.o. [name]) Boehlein, Linda Primary Care Consuello Lassalle: Gwinda PasseEdwards, Michelle Other Clinician: Referring Calista Crain: Treating Stoney Karczewski/Extender: Aurelio Brashobson, Michael Edwards, Michelle Weeks in Treatment: 11 Active Inactive Nutrition Nursing Diagnoses: Impaired glucose control: actual or potential Goals: Patient/caregiver verbalizes understanding of need to maintain therapeutic glucose control per primary care physician Date Initiated: 10/01/2019 Target Resolution Date: 12/31/2019 Goal Status: Active Interventions: Provide education on elevated blood sugars and impact on wound healing Notes: Wound/Skin Impairment Nursing Diagnoses: Impaired tissue integrity Goals: Ulcer/skin breakdown will have a volume reduction of 50% by week 8 Date Initiated: 10/01/2019 Date Inactivated:  12/03/2019 Target Resolution  Date: 12/03/2019 Unmet Reason: paraplegia and Goal Status: Unmet offloading management Ulcer/skin breakdown will have a volume reduction of 80% by week 12 Date Initiated: 12/03/2019 Target Resolution Date: 12/31/2019 Goal Status: Active Interventions: Provide education on ulcer and skin care Notes: Electronic Signature(s) Signed: 12/17/2019 6:06:29 PM By: Zenaida Deed RN, BSN Entered By: Zenaida Deed on 12/17/2019 16:13:52 -------------------------------------------------------------------------------- Pain Assessment Details Patient Name: Date of Service: [name], [name] 12/17/2019 3:30 PM Medical Record Number: [id number] Patient Account Number: [id number] Date of Birth/Sex: Treating RN: Aug 03, 1965 (54 y.o. [name] Primary Care Broadus Costilla: [name] Other Clinician: Referring Anitta Tenny: Treating Roniqua Kintz/Extender: [name] in Treatment: 11 Active Problems Location of Pain Severity and Description of Pain Patient Has Paino No Site Locations Pain Management and Medication Current Pain Management: Electronic Signature(s) Signed: 12/17/2019 6:06:29 PM By: Zenaida Deed RN, BSN Signed: 12/20/2019 7:40:36 AM By: Karl Ito Entered By: Karl Ito on 12/17/2019 15:57:57 -------------------------------------------------------------------------------- Patient/Caregiver Education Details Patient Name: Date of Service: [name] 11/5/2021andnbsp3:30 PM Medical Record Number: [id number] Patient Account Number: [id number] Date of Birth/Gender: Treating RN: 08-07-1965 (54 y.o. [name] Primary Care Physician: [name] Other Clinician: Referring Physician: Treating Physician/Extender: [name] in Treatment: 11 Education Assessment Education Provided To: Patient Education Topics Provided Pressure: Methods:  Explain/Verbal Responses: Reinforcements needed, State content correctly Wound/Skin Impairment: Methods: Explain/Verbal Responses: Reinforcements needed, State content correctly Electronic Signature(s) Signed: 12/17/2019 6:06:29 PM By: Zenaida Deed RN, BSN Entered By: Zenaida Deed on 12/17/2019 16:14:20 -------------------------------------------------------------------------------- Wound Assessment Details Patient Name: Date of Service: [name], [name] 12/17/2019 3:30 PM Medical Record Number: [id number] Patient Account Number: [id number] Date of Birth/Sex: Treating RN: 05-10-1965 (54 y.o. [name]) Yevonne Pax Primary Care Bilbo Carcamo: [name] Other Clinician: Referring Kasaundra Fahrney: Treating Malorie Bigford/Extender: [name] in Treatment: 11 Wound Status Wound Number: 1 Primary Etiology: Pressure Ulcer Wound Location: Right Metatarsal head fifth Wound Status: Open Wounding Event: Gradually Appeared Comorbid History: Hypotension, Type II Diabetes Date Acquired: 08/12/2019 Weeks Of Treatment: 11 Clustered Wound: No Wound Measurements Length: (cm) 1.7 Width: (cm) 0.7 Depth: (cm) 0.3 Area: (cm) 0.935 Volume: (cm) 0.28 % Reduction in Area: 78.1% % Reduction in Volume: 34.4% Epithelialization: Medium (34-66%) Tunneling: No Undermining: No Wound Description Classification: Category/Stage IV Wound Margin: Distinct, outline attached Exudate Amount: Medium Exudate Type: Serosanguineous Exudate Color: red, brown Foul Odor After Cleansing: No Slough/Fibrino Yes Wound Bed Granulation Amount: Large (67-100%) Exposed Structure Granulation Quality: Red, Pink Fascia Exposed: No Necrotic Amount: Small (1-33%) Fat Layer (Subcutaneous Tissue) Exposed: Yes Necrotic Quality: Adherent Slough Tendon Exposed: Yes Muscle Exposed: No Joint Exposed: No Bone Exposed: No Treatment Notes Wound #1 (Right Metatarsal head fifth) 1. Cleanse With Wound  Cleanser Soap and water 2. Periwound Care Moisturizing lotion 3. Primary Dressing Applied Collegen AG 4. Secondary Dressing ABD Pad Drawtex 6. Support Layer Applied 3 layer compression wrap Notes netting. Electronic Signature(s) Signed: 12/17/2019 5:00:24 PM By: Yevonne Pax RN Entered By: Yevonne Pax on 12/17/2019 16:16:05 -------------------------------------------------------------------------------- Wound Assessment Details Patient Name: Date of Service: [name], Sherlyn Lees 12/17/2019 3:30 PM Medical Record Number: [id number] Patient Account Number: [id number] Date of Birth/Sex: Treating RN: 06/11/1965 (54 y.o. [name]) Yevonne Pax Primary Care Paytin Ramakrishnan: [name] Other Clinician: Referring Zakeria Kulzer: Treating Paiden Caraveo/Extender: [name] in Treatment: 11 Wound Status Wound Number: 3 Primary Etiology: Pressure Ulcer Wound Location: Left Metatarsal head fifth Wound Status: Open Wounding Event: Gradually Appeared Comorbid History: Hypotension,  Type II Diabetes Date Acquired: 08/12/2019 Weeks Of Treatment: 11 Clustered Wound: No Wound Measurements Length: (cm) 1.8 Width: (cm) 1 Depth: (cm) 0.5 Area: (cm) 1.414 Volume: (cm) 0.707 % Reduction in Area: 75% % Reduction in Volume: -25.1% Epithelialization: Medium (34-66%) Tunneling: No Undermining: No Wound Description Classification: Category/Stage IV Wound Margin: Well defined, not attached Exudate Amount: Medium Exudate Type: Serosanguineous Exudate Color: red, brown Foul Odor After Cleansing: No Slough/Fibrino Yes Wound Bed Granulation Amount: Large (67-100%) Exposed Structure Granulation Quality: Red Fascia Exposed: No Necrotic Amount: Small (1-33%) Fat Layer (Subcutaneous Tissue) Exposed: Yes Necrotic Quality: Adherent Slough Tendon Exposed: No Muscle Exposed: Yes Necrosis of Muscle: No Joint Exposed: No Bone Exposed: No Treatment Notes Wound #3 (Left Metatarsal head  fifth) 1. Cleanse With Wound Cleanser Soap and water 2. Periwound Care Moisturizing lotion 3. Primary Dressing Applied Collegen AG 4. Secondary Dressing ABD Pad Drawtex 6. Support Layer Applied 3 layer compression wrap Notes netting. Electronic Signature(s) Signed: 12/17/2019 5:00:24 PM By: Yevonne Pax RN Entered By: Yevonne Pax on 12/17/2019 16:16:14 -------------------------------------------------------------------------------- Wound Assessment Details Patient Name: Date of Service: [name], Sherlyn Lees 12/17/2019 3:30 PM Medical Record Number: [id number] Patient Account Number: [id number] Date of Birth/Sex: Treating RN: 02/20/1965 (54 y.o. [name]) Yevonne Pax Primary Care Liliauna Santoni: [name] Other Clinician: Referring Johnnisha Forton: Treating Kaci Freel/Extender: [name] in Treatment: 11 Wound Status Wound Number: 4 Primary Etiology: Pressure Ulcer Wound Location: Left, Lateral Lower Leg Wound Status: Open Wounding Event: Pressure Injury Comorbid History: Hypotension, Type II Diabetes Date Acquired: 08/12/2019 Weeks Of Treatment: 11 Clustered Wound: No Wound Measurements Length: (cm) 1.2 Width: (cm) 1 Depth: (cm) 0.1 Area: (cm) 0.942 Volume: (cm) 0.094 % Reduction in Area: 68% % Reduction in Volume: 68.1% Epithelialization: Small (1-33%) Tunneling: No Undermining: No Wound Description Classification: Category/Stage III Wound Margin: Distinct, outline attached Exudate Amount: Medium Exudate Type: Serosanguineous Exudate Color: red, brown Foul Odor After Cleansing: No Slough/Fibrino Yes Wound Bed Granulation Amount: Small (1-33%) Exposed Structure Granulation Quality: Red, Pink Fascia Exposed: No Necrotic Amount: Large (67-100%) Fat Layer (Subcutaneous Tissue) Exposed: Yes Necrotic Quality: Eschar Tendon Exposed: No Muscle Exposed: No Joint Exposed: No Bone Exposed: No Treatment Notes Wound #4 (Left, Lateral Lower  Leg) 1. Cleanse With Wound Cleanser Soap and water 2. Periwound Care Moisturizing lotion 3. Primary Dressing Applied Collegen AG 4. Secondary Dressing Dry Gauze Foam 5. Secured With Medipore tape 6. Support Layer Applied 3 layer compression wrap Notes netting. Electronic Signature(s) Signed: 12/17/2019 5:00:24 PM By: Yevonne Pax RN Entered By: Yevonne Pax on 12/17/2019 16:16:25 -------------------------------------------------------------------------------- Vitals Details Patient Name: Date of Service: [name], [name] 12/17/2019 3:30 PM Medical Record Number: [id number] Patient Account Number: [id number] Date of Birth/Sex: Treating RN: [birth year] (54 y.o. [name] Primary Care Michaelanthony Kempton: [name] Other Clinician: Referring Montel Vanderhoof: Treating Jacobo Moncrief/Extender: [name] in Treatment: 11 Vital Signs Time Taken: 15:57 Temperature (F): 97.7 Height (in): 68 Pulse (bpm): 66 Weight (lbs): 235 Respiratory Rate (breaths/min): 18 Body Mass Index (BMI): 35.7 Blood Pressure (mmHg): 131/77 Reference Range: 80 - 120 mg / dl Electronic Signature(s) Signed: 12/20/2019 7:40:36 AM By: Karl Ito Entered By: Karl Ito on 12/17/2019 15:57:52

## 2019-12-21 ENCOUNTER — Other Ambulatory Visit (INDEPENDENT_AMBULATORY_CARE_PROVIDER_SITE_OTHER): Payer: Self-pay | Admitting: Primary Care

## 2019-12-21 MED ORDER — FLUOXETINE HCL 20 MG PO CAPS: 20.0000 mg | ORAL_CAPSULE | Freq: Every day | ORAL | 0 refills | Status: DC

## 2019-12-24 ENCOUNTER — Other Ambulatory Visit: Payer: Self-pay

## 2019-12-24 ENCOUNTER — Encounter (HOSPITAL_BASED_OUTPATIENT_CLINIC_OR_DEPARTMENT_OTHER): Payer: Medicaid Other | Admitting: Internal Medicine

## 2019-12-24 DIAGNOSIS — E11621 Type 2 diabetes mellitus with foot ulcer: Secondary | ICD-10-CM | POA: Diagnosis not present

## 2019-12-27 ENCOUNTER — Ambulatory Visit: Payer: Medicaid Other | Admitting: Occupational Therapy

## 2019-12-27 ENCOUNTER — Encounter: Payer: Self-pay | Admitting: Occupational Therapy

## 2019-12-27 ENCOUNTER — Encounter: Payer: Self-pay | Admitting: Physical Therapy

## 2019-12-27 ENCOUNTER — Ambulatory Visit: Payer: Medicaid Other | Admitting: Physical Therapy

## 2019-12-27 ENCOUNTER — Other Ambulatory Visit: Payer: Self-pay

## 2019-12-27 DIAGNOSIS — M25612 Stiffness of left shoulder, not elsewhere classified: Secondary | ICD-10-CM

## 2019-12-27 DIAGNOSIS — M25611 Stiffness of right shoulder, not elsewhere classified: Secondary | ICD-10-CM

## 2019-12-27 DIAGNOSIS — M6281 Muscle weakness (generalized): Secondary | ICD-10-CM

## 2019-12-27 DIAGNOSIS — M25632 Stiffness of left wrist, not elsewhere classified: Secondary | ICD-10-CM

## 2019-12-27 DIAGNOSIS — M25631 Stiffness of right wrist, not elsewhere classified: Secondary | ICD-10-CM

## 2019-12-27 DIAGNOSIS — R29818 Other symptoms and signs involving the nervous system: Secondary | ICD-10-CM

## 2019-12-27 DIAGNOSIS — R293 Abnormal posture: Secondary | ICD-10-CM

## 2019-12-27 DIAGNOSIS — R208 Other disturbances of skin sensation: Secondary | ICD-10-CM

## 2019-12-27 DIAGNOSIS — G8253 Quadriplegia, C5-C7 complete: Secondary | ICD-10-CM

## 2019-12-27 NOTE — Therapy (Signed)
[facility] [address] [place], [place], [address] Phone: [phone]   Fax:  [phone]  Occupational Therapy Treatment  Patient Details  Name: [name] MRN: [id number] Date of Birth: 11-08-1965 Referring Provider (OT): Dr. Berline Chough   Encounter Date: 12/27/2019   OT End of Session - 12/27/19 1116    Visit Number 4    Number of Visits 13    Date for OT Re-Evaluation 03/09/19    Authorization Type Medicaid    Authorization - Visit Number 4    Authorization - Number of Visits 12    OT Start Time 1104    OT Stop Time 1145    OT Time Calculation (min) 41 min    Activity Tolerance Patient tolerated treatment well    Behavior During Therapy [facility] for tasks assessed/performed           Past Medical History:  Diagnosis Date  . Diabetes mellitus without complication (HCC)   . History of cervical fracture     Past Surgical History:  Procedure Laterality Date  . CERVICAL SPINE SURGERY      There were no vitals filed for this visit.   Subjective Assessment - 12/27/19 1106    Subjective  Pt denies any pain today. 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 DM    Limitations Fall Risk. x2 transfers    Patient Stated Goals get better    Currently in Pain? No/denies                        OT Treatments/Exercises (OP) - 12/27/19 1109      Transfers   Comments SB transfer total A + 2 assists      Exercises   Exercises Shoulder      Shoulder Exercises: Supine   Horizontal ABduction AROM;Both;10 reps    External Rotation AROM;Both;10 reps    Other Supine Exercises 10 butterfly stretch    Other Supine Exercises reaching across  body to left with stacking cones with RUE for increasing stretch for reaching for increasing indpeendence with transfers, bed mobility and ADLs      Neurological Re-education Exercises   Other Exercises 1 shoulder flexion, horizontal abduction, chest press unweighted x 10    Other Exercises 2 strengthening with above exercises 5 lb dumbbell                    OT Short Term Goals - 11/19/19 1259      OT SHORT TERM GOAL #1   Title I with inital HEP-    Baseline dependent    Time 6    Period Weeks    Status New    Target Date 12/31/19      OT SHORT TERM GOAL #2   Title Pt will donn shirt with min A    Baseline mod A    Time 6    Period Weeks    Status On-going      OT SHORT TERM GOAL #3   Title Pt will report bilateral UE pain less than or equal to 4/10 during ADLS/ functional use.    Baseline bilateral UE pain 3-6/10    Time 6  Period Weeks    Status New      OT SHORT TERM GOAL #4   Title Pt will increase LUE grip strength to 17 lbs or greater for increased functional use.    Baseline RUE 30.4 lbs ,LUE 12.1 lbs    Time 6    Period Weeks    Status On-going   pt is awaiting new hospital bed     OT SHORT TERM GOAL #5   Title Pt will demonstrate improved LUE fine motor coordination for ADLs as evidenced by decreasing 9 hole peg test score to 80 secs or less.    Baseline RUE 34.22, LUE 1 min 31 secs    Time 6    Period Weeks    Status New   12/16/2018 in long sitting in clinic; pt's hospital bed is poor and does not allow pt to complete at home. Attempting to contact [employer] to determine if more suitable hospital bed can be issued. WIfe to call clinic with rep name     OT SHORT TERM GOAL #6   Title Pt will perform sliding board transfers with max A  for increased I with ADLs/ toilet transfers prn    Baseline toatl A +2 per PT report    Time 6    Period Weeks    Status New      OT SHORT TERM GOAL #7   Title Pt / family will verbalize understanding of pain  reduction strategies for UE's.    Baseline pain 3-6/10 for shoulders, hands, needs education    Time 6    Period Weeks    Status New             OT Long Term Goals - 11/19/19 1302      OT LONG TERM GOAL #1   Title Pt will donn shirt with setup    Baseline mod A    Time 12    Period Weeks    Status New      OT LONG TERM GOAL #2   Title Pt will demonstrate improved bilateral UE external rotation to [facility]  as evidenced by pt's  increased ease with supporting himself in long and circle sitting, with pain less than or equal to 3/10.    Baseline limited bilateral external rotation in shoulders, pain 3-6/10 in UE's    Time 12    Period Weeks    Status On-going   pt is awaitng new hospital bed     OT LONG TERM GOAL #3   Title Pt will demonstrate improved LUE fine motor coordination as evidenced  by decreasing 9 hole peg test score to 70 secs or less.    Baseline RUE 34.22, LUE 1 min 31 secs    Time 12    Period Weeks    Status New      OT LONG TERM GOAL #4   Title Pt/ family witllverbalize understanding of adapted strategies to maximize pt's I with ADLs/ IADLs.    Baseline needs updates/ reinforcement of strategies as pt is not perfroming consistently.    Time 12    Period Weeks    Status New      OT LONG TERM GOAL #5   Title Pt will increase RUE grip to 36 lbs and LUE grip to 20 lbs or greater in prep for bilateral functional use.    Baseline RUE 30.4, LUE 12.1    Time 12    Period Weeks    Status New  01/04/2019 R = 26 pounds, L = 3 pounds fro combined bilateral grip strength of 29 pounds (baseline combined 8 pounds)     OT LONG TERM GOAL #6   Title Pt will perform sliding board transfers to assist with ADLS and in prep for toilet transfers with mod A to level surface.    Baseline total A +2    Time 12    Period Weeks    Status New                 Plan - 12/27/19 1115    Clinical Impression Statement Pt continues to progress towards goals and UE  strengthening for increasing independence with ADLs.    OT Occupational Profile and History Detailed Assessment- Review of Records and additional review of physical, cognitive, psychosocial history related to current functional performance    Occupational performance deficits (Please refer to evaluation for details): ADL's;IADL's;Work;Leisure;Social Participation;Rest and Sleep;Play    Body Structure / Function / Physical Skills ADL;UE functional use;Endurance;Balance;Flexibility;Pain;FMC;ROM;GMC;Coordination;Sensation;Decreased knowledge of precautions;Decreased knowledge of use of DME;IADL;Strength;Dexterity;Mobility;Tone    Rehab Potential Good    Clinical Decision Making Limited treatment options, no task modification necessary    Comorbidities Affecting Occupational Performance: May have comorbidities impacting occupational performance    Modification or Assistance to Complete Evaluation  No modification of tasks or assist necessary to complete eval    OT Frequency 1x / week   plus eval   OT Duration 12 weeks    OT Treatment/Interventions Self-care/ADL training;Ultrasound;Energy conservation;Aquatic Therapy;DME and/or AE instruction;Patient/family education;Paraffin;Gait Training;Passive range of motion;Balance training;Fluidtherapy;Cryotherapy;Splinting;Building services engineer;Contrast Bath;Electrical Stimulation;Moist Heat;Therapeutic exercise;Manual Therapy;Therapeutic activities;Cognitive remediation/compensation;Neuromuscular education    Plan UE ROM and stretching in supine, ADL strategies    Consulted and Agree with Plan of Care Patient;Family member/caregiver    Family Member Consulted wife,interpreter           Patient will benefit from skilled therapeutic intervention in order to improve the following deficits and impairments:   Body Structure / Function / Physical Skills: ADL, UE functional use, Endurance, Balance, Flexibility, Pain, FMC, ROM, GMC, Coordination, Sensation,  Decreased knowledge of precautions, Decreased knowledge of use of DME, IADL, Strength, Dexterity, Mobility, Tone       Visit Diagnosis: Stiffness of left wrist, not elsewhere classified  Stiffness of right shoulder, not elsewhere classified  Stiffness of left shoulder, not elsewhere classified  Stiffness of right wrist, not elsewhere classified  Muscle weakness (generalized)    Problem List Patient Active Problem List   Diagnosis Date Noted  . At risk for unstable body temperature 11/26/2019  . Wheelchair dependence 11/26/2019  . Pressure ulcer 09/22/2019  . Cellulitis 09/22/2019  . Hypokalemia 09/22/2019  . Spasticity 03/08/2019  . Erectile dysfunction due to diseases classified elsewhere 03/08/2019  . 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 Neurogenic bowel 01/22/2019  . Neurogenic bladder 01/22/2019  . Myofascial pain 01/22/2019  . 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 Quadriplegia [facility]) 12/30/2018    Junious Dresser MOT, OTR/L  12/27/2019, 12:48 PM  [facility] [address] [place], [place], [address] Phone: [phone]   Fax:  [phone]  Name: [name] MRN: [id number] Date of Birth: Sep 04, 1965

## 2019-12-27 NOTE — Therapy (Signed)
[facility] [address] [place], [place], [address] Phone: [phone]   Fax:  [phone]  Physical Therapy Treatment  Patient Details  Name: [name] MRN: [id number] Date of Birth: 05/08/1965 Referring Provider (PT): Dr. Dagoberto Ligas   Encounter Date: 12/27/2019   PT End of Session - 12/27/19 1705    Visit Number 5    Number of Visits 9    Date for PT Re-Evaluation 01/10/20    Authorization Type [employer] Medicaid - 27 OT/PT/ST  No Submission needed at this time per    Authorization - Visit Number 5    Authorization - Number of Visits 27    PT Start Time 1020    PT Stop Time 1105    PT Time Calculation (min) 45 min    Equipment Utilized During Treatment Other (comment)   slideboard   Activity Tolerance Patient tolerated treatment well    Behavior During Therapy WFL for tasks assessed/performed           Past Medical History:  Diagnosis Date   Diabetes mellitus without complication ([place])    History of cervical fracture     Past Surgical History:  Procedure Laterality Date   CERVICAL SPINE SURGERY      There were no vitals filed for this visit.   Subjective Assessment - 12/27/19 1652    Subjective Wounds on feet continue to heal; wound on L lateral leg is not healing as well.  Pt reports he mainly sleeps on his back with [facility] but does sometimes lie on L side for about an hour.    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 DM2    Patient Stated Goals Pt wants to be able to do exercises so he can move more and not feel so tight.    Currently in Pain? No/denies                             [facility] Adult PT Treatment/Exercise - 12/27/19 1653       Bed Mobility   Bed Mobility Rolling Left;Sit to Supine    Rolling Left Moderate Assistance - Patient 50-74%    Sit to Supine Moderate Assistance - Patient 50-74%      Transfers   Transfers Lateral/Scoot Transfers    Lateral/Scoot Transfers 4: Min assist;3: Mod assist;With slide board    Lateral/Scoot Transfer Details (indicate cue type and reason) From w/c > mat to L side with feet supported; therapist initially only assisted with maintaining anterior lean and balance; also provided some assistance for repositioning LE but pt able to perform 90% of scooting across board.  Once pt was on mat he required mod A to complete scooting and then to scoot hips back on mat      Therapeutic Activites    Therapeutic Activities Other Therapeutic Activities    Other Therapeutic Activities Bed positioning when lying on L side.  Per wife when pt is on his L side the LLE is straight with the RLE on top of it with pillow between.  Recommended that wife slide LLE slightly more forwards and continue to prop RLE on pillows but to avoid stacking lower legs as this may be putting increased pressure  on L lateral leg.  Educated wife on how pressure injuries develop (constant pressure and decreased blood flow affecting tissue health) and friction.  Will continue to monitor      Knee/Hip Exercises: Stretches   ITB Stretch Right;Left;2 reps;60 seconds    ITB Stretch Limitations in supine    Piriformis Stretch Right;Left;60 seconds;3 reps    Piriformis Stretch Limitations in supine; demonstrated to pt how to have son stretch hip ER muscles to improve LE positioning in chair and bed; will update and give pt new HEP handout                  PT Education - 12/27/19 1700    Education Details LE stretches for hip ER muscles, LE positioning to decrease pressure on LLE    Person(s) Educated Patient;Spouse    Methods Explanation;Demonstration    Comprehension Verbalized understanding            PT Short Term  Goals - 12/15/19 1259      PT SHORT TERM GOAL #1   Title Pt and wife will be instructed in HEP for BLE stretching to ensure they are performing correctly. (due by 3rd visit)    Baseline PT has been reissued HEP for stretching and started performing with family. 12/15/19    Time 3    Period Weeks    Status Achieved    Target Date 12/15/19      PT SHORT TERM GOAL #2   Title Pt will be able to perform slideboard transfer max assist of 1 for improved mobility    Baseline Max assist +1 with second person still for safety 12/15/19    Time 3    Status Partially Met    Target Date 12/15/19      PT SHORT TERM GOAL #3   Title Pt will be instructed in ways to better manage swelling in legs and pressure relief to prevent further injury.    Baseline 12/15/19 Pt is wearing procare boots to offload heels to protect. Has been instructed in importance of elevating legs more to help with swelling.    Time 3    Period Weeks    Status Achieved    Target Date 12/15/19      PT SHORT TERM GOAL #4   Title Pt will be able to maintain long sit supervision x 2 min to assist with ADLs.    Baseline unable to assess at eval but limited HS length will challenge long sit. 12/15/19 Pt needs CGA/min assist to maintain long sit    Time 3    Period Weeks    Status Partially Met    Target Date 12/15/19             PT Long Term Goals - 11/16/19 2059      PT LONG TERM GOAL #1   Title Pt will be able to perform slideboard transfer min assist once board is placed for improved mobility.    Baseline max assist +2    Time 8    Period Weeks    Status New    Target Date 01/10/20      PT LONG TERM GOAL #2   Title Pt will transition from long sit to short sit min assist for improved mobility.    Baseline currently hoyer lift transfer at home.    Time 8    Period Weeks    Status New    Target Date 01/10/20      PT  LONG TERM GOAL #3   Title Patient will perform long sit/circle without UE support >/= 5 minutes  with min guard for improved ability to assist with ADLs in bed.    Baseline unable to assess at eval but has limited hamstring length that will affect long sitting balance.    Time 8    Period Weeks    Status New    Target Date 01/10/20      PT LONG TERM GOAL #4   Title Pt will perform rolling in bed mod assist for improved mobility.    Baseline max assist    Time 8    Period Weeks    Status New    Target Date 01/10/20                 Plan - 12/27/19 1705    Clinical Impression Statement Pt demonstrated improved technique and sequencing of slideboard from w/c > mat today with assistance for set up.  Demonstrated to pt alternate way to stretch hip ER muscles to improve LE position in sitting and supine.  Also provided pt and wife with education for LE positioning in supine to continue to minimize pressure on L lateral leg.  Will continue to monitor and will continue to address as needed.    Personal Factors and Comorbidities Comorbidity 2    Comorbidities history of DVTs, DM2    Examination-Activity Limitations Bathing;Bed Mobility;Transfers;Dressing    Examination-Participation Restrictions Community Activity    Stability/Clinical Decision Making Evolving/Moderate complexity    Rehab Potential Good    PT Frequency 1x / week   plus eval   PT Duration 8 weeks    PT Treatment/Interventions ADLs/Self Care Home Management;Cryotherapy;DME Instruction;Moist Heat;Functional mobility training;Therapeutic activities;Therapeutic exercise;Balance training;Patient/family education;Neuromuscular re-education;Manual techniques;Wheelchair mobility training;Passive range of motion    PT Next Visit Plan Please give most updated HEP - I changed the hip ER stretches for son to help him with.  Were they able to try alternate positioning in L sidelying?  SCI-FIT. Slideboard transfers, sitting balance in short sit and long sit, bed mobility.    Consulted and Agree with Plan of Care Patient;Family  member/caregiver    Family Member Consulted wife           Patient will benefit from skilled therapeutic intervention in order to improve the following deficits and impairments:  Decreased balance, Decreased mobility, Decreased knowledge of use of DME, Decreased activity tolerance, Decreased range of motion, Impaired flexibility, Decreased strength, Increased edema, Impaired sensation, Impaired tone, Impaired UE functional use  Visit Diagnosis: Muscle weakness (generalized)  Abnormal posture  Other symptoms and signs involving the nervous system  Other disturbances of skin sensation  Quadriplegia, C5-C7 complete ([place])     Problem List Patient Active Problem List   Diagnosis Date Noted   At risk for unstable body temperature 11/26/2019   Wheelchair dependence 11/26/2019   Pressure ulcer 09/22/2019   Cellulitis 09/22/2019   Hypokalemia 09/22/2019   Spasticity 03/08/2019   Erectile dysfunction due to diseases classified elsewhere 03/08/2019   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/22/2019   Neurogenic bowel 01/22/2019   Neurogenic bladder 01/22/2019   Myofascial pain 01/22/2019   Spinal cord injury at T1-T6 level ([place]) 12/30/2018   Quadriplegia ([place]) 12/30/2018    Rico Junker, PT, DPT 12/27/19    5:09 PM    [facility] [address] [place], [place], [address] Phone: [phone]   Fax:  [phone]  Name: [name] MRN:  [id number] Date of Birth: 1965/04/23
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
## 2019-12-27 NOTE — Patient Instructions (Addendum)
Access Code: [email or web address] URL: [email or web address] Date: 12/27/2019 Prepared by: Bufford Lope  Exercises Supine Hip Adductor Stretch - 1 x daily - 7 x weekly - 3 reps - 1 sets - 30 seconds hold Single Knee to Chest Stretch - 2 x daily - 7 x weekly - 2 sets - 1 minute hold Supine Piriformis Stretch - 1 x daily - 7 x weekly - 2 sets - 1 minute hold Supine ITB Stretch with Strap - 1 x daily - 7 x weekly - 2 sets - 30 hold
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
## 2019-12-31 ENCOUNTER — Encounter (HOSPITAL_BASED_OUTPATIENT_CLINIC_OR_DEPARTMENT_OTHER): Payer: Medicaid Other | Admitting: Internal Medicine

## 2019-12-31 ENCOUNTER — Other Ambulatory Visit: Payer: Self-pay

## 2019-12-31 ENCOUNTER — Encounter: Payer: Self-pay | Admitting: Physical Medicine and Rehabilitation

## 2019-12-31 DIAGNOSIS — E11621 Type 2 diabetes mellitus with foot ulcer: Secondary | ICD-10-CM | POA: Diagnosis not present
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
## 2020-01-03 ENCOUNTER — Encounter: Payer: Self-pay | Admitting: Physical Medicine and Rehabilitation

## 2020-01-03 ENCOUNTER — Encounter
Payer: Medicaid Other | Attending: Physical Medicine and Rehabilitation | Admitting: Physical Medicine and Rehabilitation

## 2020-01-03 VITALS — BP 121/80 | HR 61 | Temp 98.0°F

## 2020-01-03 DIAGNOSIS — R252 Cramp and spasm: Secondary | ICD-10-CM

## 2020-01-03 DIAGNOSIS — G8250 Quadriplegia, unspecified: Secondary | ICD-10-CM

## 2020-01-03 DIAGNOSIS — N319 Neuromuscular dysfunction of bladder, unspecified: Secondary | ICD-10-CM | POA: Diagnosis not present

## 2020-01-03 DIAGNOSIS — M7918 Myalgia, other site: Secondary | ICD-10-CM

## 2020-01-03 DIAGNOSIS — Z993 Dependence on wheelchair: Secondary | ICD-10-CM | POA: Diagnosis not present

## 2020-01-03 MED ORDER — FLUOXETINE HCL 20 MG PO CAPS: 20.0000 mg | ORAL_CAPSULE | Freq: Every day | ORAL | 11 refills | Status: DC
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
## 2020-01-03 NOTE — Patient Instructions (Signed)
Plan: 1. Patient here for trigger point injections for  Consent done and on chart.  Cleaned areas with alcohol and injected using a 27 gauge 1.5 inch needle  Injected 7cc Using 1% Lidocaine with no EPI  Upper traps B/L Levators B/L Posterior scalenes B/L x2 Middle scalenes Splenius Capitus B/L Pectoralis Major B/L  Rhomboids B/L x2 Infraspinatus Teres Major/minor Thoracic paraspinals Lumbar paraspinals Other injections- L deltoid x2   Patient's level of pain prior was 5-6/10 Current level of pain after injections is down to 4/10  There was no bleeding or complications.  Patient was advised to drink a lot of water on day after injections to flush system Will have increased soreness for 12-48 hours after injections.  Can use Lidocaine patches the day AFTER injections Can use theracane on day of injections in places didn't inject Can use heating pad 4-6 hours AFTER injections  2. No med changes today- will refill Prozac/Fluoxetine 20 mg daily-   3. F/U in 2 months

## 2020-01-04 ENCOUNTER — Encounter (HOSPITAL_BASED_OUTPATIENT_CLINIC_OR_DEPARTMENT_OTHER): Payer: Medicaid Other | Admitting: Internal Medicine

## 2020-01-04 ENCOUNTER — Other Ambulatory Visit: Payer: Self-pay

## 2020-01-04 DIAGNOSIS — E11621 Type 2 diabetes mellitus with foot ulcer: Secondary | ICD-10-CM | POA: Diagnosis not present

## 2020-01-04 NOTE — Progress Notes (Signed)
KALUM, MINNER ([id number]) Visit Report for 11/[name]/2021 Arrival Information Details Patient Name: Date of Service: [name] 11/[name]/2021 11:30 A M Medical Record Number: [id number] Patient Account Number: [id number] Date of Birth/Sex: Treating RN: May [birth year], [name] (54 y.o. [name] Primary Care Gaspare Netzel: [name] Other Clinician: Referring Kaeson Kleinert: Treating Cheyanne Lamison/Extender: [name] in Treatment: 13 Visit Information History Since Last Visit Added or deleted any medications: No Patient Arrived: Wheel Chair Any new allergies or adverse reactions: No Arrival Time: 11:45 Had a fall or experienced change in No Accompanied By: wife and interpreter activities of daily living that may affect Transfer Assistance: None risk of falls: Patient Identification Verified: Yes Signs or symptoms of abuse/neglect since last visito No Secondary Verification Process Completed: Yes Hospitalized since last visit: No Patient Requires Transmission-Based Precautions: No Implantable device outside of the clinic excluding No Patient Has Alerts: Yes cellular tissue based products placed in the center Patient Alerts: Translator Required since last visit: right ABI 1.61 Has Dressing in Place as Prescribed: Yes Left ABI 1.36 Has Compression in Place as Prescribed: Yes Pain Present Now: No Electronic Signature(s) Signed: 11/[name]/2021 4:11:31 PM By: Zandra Abts RN, BSN Entered By: Zandra Abts on 11/[name]/2021 12:13:28 -------------------------------------------------------------------------------- Compression Therapy Details Patient Name: Date of Service: [name], [name] 11/[name]/2021 11:30 A M Medical Record Number: [id number] Patient Account Number: [id number] Date of Birth/Sex: Treating RN: [name] (54 y.o. [name] Primary Care Nalany Steedley: [name] Other Clinician: Referring Harpreet Signore: Treating Breeley Bischof/Extender: [name] in Treatment: 13 Compression Therapy Performed for Wound Assessment: Wound #1 Right Metatarsal head fifth Performed By: Clinician Zandra Abts, RN Compression Type: Four Layer Electronic Signature(s) Signed: 11/[name]/2021 4:11:31 PM By: Zandra Abts RN, BSN Entered By: Zandra Abts on 11/[name]/2021 12:17:44 -------------------------------------------------------------------------------- Compression Therapy Details Patient Name: Date of Service: [name] RLO S 11/[name]/2021 11:30 A M Medical Record Number: [id number] Patient Account Number: [id number] Date of Birth/Sex: Treating RN: [name] (54 y.o. [name] Primary Care Jaiveon Suppes: [name] Other Clinician: Referring Mitzy Naron: Treating Nyssa Sayegh/Extender: [name] in Treatment: 13 Compression Therapy Performed for Wound Assessment: Wound #3 Left Metatarsal head fifth Performed By: Clinician Zandra Abts, RN Compression Type: Four Layer Electronic Signature(s) Signed: 11/[name]/2021 4:11:31 PM By: Zandra Abts RN, BSN Entered By: Zandra Abts on 11/[name]/2021 12:17:44 -------------------------------------------------------------------------------- Compression Therapy Details Patient Name: Date of Service: [name] RLO S 11/[name]/2021 11:30 A M Medical Record Number: [id number] Patient Account Number: [id number] Date of Birth/Sex: Treating RN: [name] (54 y.o. [name] Primary Care Jamilette Suchocki: [name] Other Clinician: Referring Dellanira Dillow: Treating Deronte Solis/Extender: [name] in Treatment: 13 Compression Therapy Performed for Wound Assessment: Wound #4 Left,Lateral Lower Leg Performed By: Clinician Zandra Abts, RN Compression Type: Four Layer Electronic Signature(s) Signed: 11/[name]/2021 4:11:31 PM By: Zandra Abts RN, BSN Entered By: Zandra Abts on 11/[name]/2021  12:17:44 -------------------------------------------------------------------------------- Compression Therapy Details Patient Name: Date of Service: [name], [name] RLO S 11/[name]/2021 11:30 A M Medical Record Number: [id number] Patient Account Number: [id number] Date of Birth/Sex: Treating RN: [name], [name] (54 y.o. [name] Primary Care Cassandr Cederberg: [name] Other Clinician: Referring Elzina Devera: Treating Sameerah Nachtigal/Extender: [name] in Treatment: 13 Compression Therapy Performed for Wound Assessment: Wound #5 Right,Anterior Upper Leg Performed By: Clinician Zandra Abts, RN Compression Type: Four Layer Electronic Signature(s) Signed: 11/[name]/2021 4:11:31 PM By: Zandra Abts RN, BSN Entered By: Zandra Abts on 11/[name]/2021 12:17:44 -------------------------------------------------------------------------------- Encounter Discharge Information Details  Patient Name: Date of Service: [name] 11/[name]/2021 11:30 A M Medical Record Number: [id number] Patient Account Number: [id number] Date of Birth/Sex: Treating RN: [name] (54 y.o. [name] Primary Care Helena Sardo: Other Clinician: [name] Referring Zulma Court: Treating Verleen Stuckey/Extender: [name] in Treatment: 13 Encounter Discharge Information Items Discharge Condition: Stable Ambulatory Status: Wheelchair Discharge Destination: Home Transportation: Other Accompanied By: interpreter and caregiver Schedule Follow-up Appointment: Yes Clinical Summary of Care: Patient Declined Electronic Signature(s) Signed: 11/[name]/2021 4:11:31 PM By: Zandra Abts RN, BSN Entered By: Zandra Abts on 11/[name]/2021 12:20:41 -------------------------------------------------------------------------------- Wound Assessment Details Patient Name: Date of Service: [name] RLO S 11/[name]/2021 11:30 A M Medical Record Number: [id number] Patient  Account Number: [id number] Date of Birth/Sex: Treating RN: 11/07/[name] (54 y.o. [name] Primary Care Emmanuella Mirante: [name] Other Clinician: Referring [name]: Treating Romario Tith/Extender: [name] in Treatment: 13 Wound Status Wound Number: 1 Primary Etiology: Pressure Ulcer Wound Location: Right Metatarsal head fifth Wound Status: Open Wounding Event: Gradually Appeared Comorbid History: Hypotension, Type II Diabetes Date Acquired: 08/12/2019 Weeks Of Treatment: 13 Clustered Wound: No Wound Measurements Length: (cm) 0.7 Width: (cm) 0.4 Depth: (cm) 0.3 Area: (cm) 0.22 Volume: (cm) 0.066 % Reduction in Area: 94.9% % Reduction in Volume: 84.5% Epithelialization: Large (67-100%) Tunneling: No Undermining: No Wound Description Classification: Category/Stage IV Exudate Amount: Small Exudate Type: Serosanguineous Exudate Color: red, brown Foul Odor After Cleansing: No Slough/Fibrino Yes Wound Bed Granulation Amount: Large (67-100%) Exposed Structure Granulation Quality: Red Fascia Exposed: No Necrotic Amount: None Present (0%) Fat Layer (Subcutaneous Tissue) Exposed: Yes Tendon Exposed: No Muscle Exposed: No Joint Exposed: No Bone Exposed: No Treatment Notes Wound #1 (Right Metatarsal head fifth) 1. Cleanse With Wound Cleanser Soap and water 2. Periwound Care Moisturizing lotion 3. Primary Dressing Applied Collegen AG Hydrogel or K-Y Jelly 4. Secondary Dressing Dry Gauze Drawtex 6. Support Layer Applied 4 layer compression wrap 7. Footwear/Offloading device applied Multipodus Splint/Boot Notes netting. Electronic Signature(s) Signed: 11/[name]/2021 4:11:31 PM By: Zandra Abts RN, BSN Entered By: Zandra Abts on 11/[name]/2021 12:16:37 -------------------------------------------------------------------------------- Wound Assessment Details Patient Name: Date of Service: [name], [name] 11/[name]/2021 11:30 A  M Medical Record Number: [id number] Patient Account Number: [id number] Date of Birth/Sex: Treating RN: [name] (54 y.o. [name] Primary Care Jocie Meroney: [name] Other Clinician: Referring Armond Cuthrell: Treating Yasmina Chico/Extender: [name] in Treatment: 13 Wound Status Wound Number: 3 Primary Etiology: Pressure Ulcer Wound Location: Left Metatarsal head fifth Wound Status: Open Wounding Event: Gradually Appeared Comorbid History: Hypotension, Type II Diabetes Date Acquired: 08/12/2019 Weeks Of Treatment: 13 Clustered Wound: No Wound Measurements Length: (cm) 1.2 Width: (cm) 0.6 Depth: (cm) 0.6 Area: (cm) 0.565 Volume: (cm) 0.339 % Reduction in Area: 90% % Reduction in Volume: 40% Epithelialization: Medium (34-66%) Tunneling: No Undermining: No Wound Description Classification: Category/Stage IV Wound Margin: Flat and Intact Exudate Amount: Medium Exudate Type: Serosanguineous Exudate Color: red, brown Foul Odor After Cleansing: No Slough/Fibrino Yes Wound Bed Granulation Amount: Large (67-100%) Exposed Structure Granulation Quality: Red Fascia Exposed: No Necrotic Amount: Small (1-33%) Fat Layer (Subcutaneous Tissue) Exposed: Yes Necrotic Quality: Adherent Slough Tendon Exposed: No Muscle Exposed: No Joint Exposed: No Bone Exposed: No Treatment Notes Wound #3 (Left Metatarsal head fifth) 1. Cleanse With Wound Cleanser Soap and water 2. Periwound Care Moisturizing lotion 3. Primary Dressing Applied Collegen AG Hydrogel or K-Y Jelly 4. Secondary Dressing Dry Gauze Drawtex 6. Support Layer Applied 4 layer compression wrap  7. Footwear/Offloading device applied Multipodus Splint/Boot Notes netting. Electronic Signature(s) Signed: 11/[name]/2021 4:11:31 PM By: Zandra Abts RN, BSN Entered By: Zandra Abts on 11/[name]/2021  12:16:56 -------------------------------------------------------------------------------- Wound Assessment Details Patient Name: Date of Service: [name], [name] 11/[name]/2021 11:30 A M Medical Record Number: [id number] Patient Account Number: [id number] Date of Birth/Sex: Treating RN: [name] (54 y.o. [name] Primary Care Elchonon Maxson: [name] Other Clinician: Referring Dent Plantz: Treating Lindy Garczynski/Extender: [name] in Treatment: 13 Wound Status Wound Number: 4 Primary Etiology: Pressure Ulcer Wound Location: Left, Lateral Lower Leg Wound Status: Open Wounding Event: Pressure Injury Comorbid History: Hypotension, Type II Diabetes Date Acquired: 08/12/2019 Weeks Of Treatment: 13 Clustered Wound: No Wound Measurements Length: (cm) 1 Width: (cm) 1 Depth: (cm) 0.1 Area: (cm) 0.785 Volume: (cm) 0.079 % Reduction in Area: 73.3% % Reduction in Volume: 73.2% Epithelialization: Medium (34-66%) Tunneling: No Undermining: No Wound Description Classification: Category/Stage III Wound Margin: Flat and Intact Exudate Amount: Small Exudate Type: Serosanguineous Exudate Color: red, brown Foul Odor After Cleansing: No Slough/Fibrino Yes Wound Bed Granulation Amount: Large (67-100%) Exposed Structure Granulation Quality: Red, Pink Fascia Exposed: No Necrotic Amount: None Present (0%) Fat Layer (Subcutaneous Tissue) Exposed: Yes Tendon Exposed: No Muscle Exposed: No Joint Exposed: No Bone Exposed: No Treatment Notes Wound #4 (Left, Lateral Lower Leg) 1. Cleanse With Wound Cleanser Soap and water 2. Periwound Care Moisturizing lotion 3. Primary Dressing Applied Collegen AG Hydrogel or K-Y Jelly 4. Secondary Dressing Dry Gauze Drawtex 6. Support Layer Applied 4 layer compression wrap 7. Footwear/Offloading device applied Multipodus Splint/Boot Notes netting. Electronic Signature(s) Signed: 11/[name]/2021 4:11:31 PM By:  Zandra Abts RN, BSN Entered By: Zandra Abts on 11/[name]/2021 12:17:17 -------------------------------------------------------------------------------- Wound Assessment Details Patient Name: Date of Service: [name], [name] 11/[name]/2021 11:30 A M Medical Record Number: [id number] Patient Account Number: [id number] Date of Birth/Sex: Treating RN: [name] (54 y.o. [name] Primary Care Taleia Sadowski: [name] Other Clinician: Referring Aspen Deterding: Treating Akacia Boltz/Extender: [name] in Treatment: 13 Wound Status Wound Number: 5 Primary Etiology: 2nd degree Burn Wound Location: Right, Anterior Upper Leg Wound Status: Open Wounding Event: Thermal Burn Comorbid History: Hypotension, Type II Diabetes Date Acquired: 12/19/2019 Weeks Of Treatment: 1 Clustered Wound: No Wound Measurements Length: (cm) 1.5 Width: (cm) 1.2 Depth: (cm) 0.1 Area: (cm) 1.414 Volume: (cm) 0.141 % Reduction in Area: 0% % Reduction in Volume: 0% Epithelialization: Large (67-100%) Tunneling: No Wound Description Classification: Partial Thickness Wound Margin: Flat and Intact Exudate Amount: Small Exudate Type: Serosanguineous Exudate Color: red, brown Foul Odor After Cleansing: No Slough/Fibrino No Wound Bed Granulation Amount: Large (67-100%) Exposed Structure Granulation Quality: Pink Fascia Exposed: No Necrotic Amount: None Present (0%) Fat Layer (Subcutaneous Tissue) Exposed: No Tendon Exposed: No Muscle Exposed: No Joint Exposed: No Bone Exposed: No Treatment Notes Wound #5 (Right, Anterior Upper Leg) 1. Cleanse With Soap and water 3. Primary Dressing Applied Other primary dressing (specifiy in notes) 4. Secondary Dressing Dry Gauze 6. Support Layer Applied 3 layer compression wrap Notes triple abx ointment as primary. Electronic Signature(s) Signed: 11/[name]/2021 4:11:31 PM By: Zandra Abts RN, BSN Entered By: Zandra Abts on  11/[name]/2021 12:17:32

## 2020-01-05 ENCOUNTER — Ambulatory Visit: Payer: Medicaid Other

## 2020-01-05 ENCOUNTER — Ambulatory Visit: Payer: Medicaid Other | Admitting: Occupational Therapy

## 2020-01-05 ENCOUNTER — Other Ambulatory Visit: Payer: Self-pay

## 2020-01-05 DIAGNOSIS — M6281 Muscle weakness (generalized): Secondary | ICD-10-CM

## 2020-01-05 DIAGNOSIS — R278 Other lack of coordination: Secondary | ICD-10-CM

## 2020-01-05 DIAGNOSIS — M25511 Pain in right shoulder: Secondary | ICD-10-CM

## 2020-01-05 DIAGNOSIS — M25611 Stiffness of right shoulder, not elsewhere classified: Secondary | ICD-10-CM

## 2020-01-05 DIAGNOSIS — M25612 Stiffness of left shoulder, not elsewhere classified: Secondary | ICD-10-CM

## 2020-01-05 DIAGNOSIS — M25512 Pain in left shoulder: Secondary | ICD-10-CM

## 2020-01-05 DIAGNOSIS — G8929 Other chronic pain: Secondary | ICD-10-CM

## 2020-01-05 DIAGNOSIS — G8253 Quadriplegia, C5-C7 complete: Secondary | ICD-10-CM

## 2020-01-05 NOTE — Therapy (Addendum)
[facility] [address] [place], [place], [address] Phone: [phone]   Fax:  [phone]  Occupational Therapy Treatment  Patient Details  Name: [name] MRN: [id number] Date of Birth: 04-Feb-1966 Referring Provider (OT): Dr. Berline Chough   Encounter Date: 01/05/2020   OT End of Session - 01/05/20 1102    Visit Number 5    Number of Visits 13    Date for OT Re-Evaluation 03/09/19    Authorization Type Medicaid    Authorization - Visit Number 5    Authorization - Number of Visits 12    OT Start Time 1101    OT Stop Time 1145    OT Time Calculation (min) 44 min    Activity Tolerance Patient tolerated treatment well    Behavior During Therapy [facility] for tasks assessed/performed           Past Medical History:  Diagnosis Date  . Diabetes mellitus without complication (HCC)   . History of cervical fracture     Past Surgical History:  Procedure Laterality Date  . CERVICAL SPINE SURGERY      There were no vitals filed for this visit.   Subjective Assessment - 01/05/20 1103    Subjective  Pt denies any pain. Patient reports no soreness s/p lidocaine injections.    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 DM    Limitations Fall Risk. x2 transfers    Patient Stated Goals get better    Currently in Pain? No/denies          Treatment:  Supine AROM with personal cane - shoulder flexion, chest press, ER/IR and horizontal abduction  PROM in supine and soft tissue massage for pecs for horizontal abduction  Supine with 3 lb dumbbell x 1 reps shoulder flexion, chest press, skull crushers unliateral (tricep)  Pt transferred with max A with assist of 2 and sliding  board transfer                     OT Short Term Goals - 01/05/20 1157      OT SHORT TERM GOAL #1   Title I with inital HEP-    Baseline dependent    Time 6    Period Weeks    Status New    Target Date 12/31/19      OT SHORT TERM GOAL #2   Title Pt will donn shirt with min A    Baseline mod A    Time 6    Period Weeks    Status On-going      OT SHORT TERM GOAL #3   Title Pt will report bilateral UE pain less than or equal to 4/10 during ADLS/ functional use.    Baseline bilateral UE pain 3-6/10    Time 6    Period Weeks    Status New      OT SHORT TERM GOAL #4   Title Pt will increase LUE grip strength to 17 lbs or greater for increased functional use.    Baseline RUE 30.4 lbs ,LUE 12.1 lbs    Time 6    Period Weeks    Status On-going      OT SHORT TERM GOAL #5   Title Pt will demonstrate  improved LUE fine motor coordination for ADLs as evidenced by decreasing 9 hole peg test score to 80 secs or less.    Baseline RUE 34.22, LUE 1 min 31 secs    Time 6    Period Weeks    Status New      OT SHORT TERM GOAL #6   Title Pt will perform sliding board transfers with max A  for increased I with ADLs/ toilet transfers prn    Baseline toatl A +2 per PT report    Time 6    Period Weeks    Status New      OT SHORT TERM GOAL #7   Title Pt / family will verbalize understanding of pain reduction strategies for UE's.    Baseline pain 3-6/10 for shoulders, hands, needs education    Time 6    Period Weeks    Status New             OT Long Term Goals - 01/05/20 1158      OT LONG TERM GOAL #1   Title Pt will donn shirt with setup    Baseline mod A    Time 12    Period Weeks    Status New      OT LONG TERM GOAL #2   Title Pt will demonstrate improved bilateral UE external rotation to [facility]  as evidenced by pt's  increased ease with supporting himself in long and circle sitting, with pain less than or equal to 3/10.    Baseline limited bilateral  external rotation in shoulders, pain 3-6/10 in UE's    Time 12    Period Weeks    Status On-going      OT LONG TERM GOAL #3   Title Pt will demonstrate improved LUE fine motor coordination as evidenced  by decreasing 9 hole peg test score to 70 secs or less.    Baseline RUE 34.22, LUE 1 min 31 secs    Time 12    Period Weeks    Status New      OT LONG TERM GOAL #4   Title Pt/ family witllverbalize understanding of adapted strategies to maximize pt's I with ADLs/ IADLs.    Baseline needs updates/ reinforcement of strategies as pt is not perfroming consistently.    Time 12    Period Weeks    Status New      OT LONG TERM GOAL #5   Title Pt will increase RUE grip to 36 lbs and LUE grip to 20 lbs or greater in prep for bilateral functional use.    Baseline RUE 30.4, LUE 12.1    Time 12    Period Weeks    Status New      OT LONG TERM GOAL #6   Title Pt will perform sliding board transfers to assist with ADLS and in prep for toilet transfers with mod A to level surface.    Baseline total A +2    Time 12    Period Weeks    Status New                 Plan - 01/05/20 1158    Clinical Impression Statement Pt is progressing towards goals. Pt continues to improve with UE strengthening and ROM    OT Occupational Profile and History Detailed Assessment- Review of Records and additional review of physical, cognitive, psychosocial history related to current functional performance    Occupational performance deficits (Please refer to evaluation  for details): ADL's;IADL's;Work;Leisure;Social Participation;Rest and Sleep;Play    Body Structure / Function / Physical Skills ADL;UE functional use;Endurance;Balance;Flexibility;Pain;FMC;ROM;GMC;Coordination;Sensation;Decreased knowledge of precautions;Decreased knowledge of use of DME;IADL;Strength;Dexterity;Mobility;Tone    Rehab Potential Good    Clinical Decision Making Limited treatment options, no task modification necessary     Comorbidities Affecting Occupational Performance: May have comorbidities impacting occupational performance    Modification or Assistance to Complete Evaluation  No modification of tasks or assist necessary to complete eval    OT Frequency 1x / week   plus eval   OT Duration 12 weeks    OT Treatment/Interventions Self-care/ADL training;Ultrasound;Energy conservation;Aquatic Therapy;DME and/or AE instruction;Patient/family education;Paraffin;Gait Training;Passive range of motion;Balance training;Fluidtherapy;Cryotherapy;Splinting;Building services engineer;Contrast Bath;Electrical Stimulation;Moist Heat;Therapeutic exercise;Manual Therapy;Therapeutic activities;Cognitive remediation/compensation;Neuromuscular education    Plan grip strengthening, FMC for LUE, UB dressing    Consulted and Agree with Plan of Care Patient;Family member/caregiver    Family Member Consulted wife,interpreter           Patient will benefit from skilled therapeutic intervention in order to improve the following deficits and impairments:   Body Structure / Function / Physical Skills: ADL, UE functional use, Endurance, Balance, Flexibility, Pain, FMC, ROM, GMC, Coordination, Sensation, Decreased knowledge of precautions, Decreased knowledge of use of DME, IADL, Strength, Dexterity, Mobility, Tone       Visit Diagnosis: Stiffness of right shoulder, not elsewhere classified  Stiffness of left shoulder, not elsewhere classified  Muscle weakness (generalized)  Other lack of coordination  Chronic right shoulder pain  Chronic left shoulder pain    Problem List Patient Active Problem List   Diagnosis Date Noted  . At risk for unstable body temperature 11/26/2019  . Wheelchair dependence 11/26/2019  . Pressure ulcer 09/22/2019  . Cellulitis 09/22/2019  . Hypokalemia 09/22/2019  . Spasticity 03/08/2019  . Erectile dysfunction due to diseases classified elsewhere 03/08/2019  . Presence of IVC filter  01/22/2019  . Neurogenic bowel 01/22/2019  . Neurogenic bladder 01/22/2019  . Myofascial pain 01/22/2019  . 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 Quadriplegia [facility]) 12/30/2018    Junious Dresser MOT, OTR/L  01/05/2020, 11:59 AM  [facility] [address] [place], [place], [address] Phone: [phone]   Fax:  [phone]  Name: [name] MRN: [id number] Date of Birth: 1965/02/21

## 2020-01-05 NOTE — Therapy (Signed)
[facility] [address] [place], [place], [address] Phone: [phone]   Fax:  [phone]  Physical Therapy Treatment  Patient Details  Name: [name] MRN: [id number] Date of Birth: 11-Mar-1965 Referring Provider (PT): Dr. Dagoberto Ligas   Encounter Date: 01/05/2020   PT End of Session - 01/05/20 1711    Visit Number 6    Number of Visits 9    Date for PT Re-Evaluation 01/10/20    Authorization Type [employer] Medicaid - 27 OT/PT/ST  No Submission needed at this time per    Authorization - Visit Number 6    Authorization - Number of Visits 27    PT Start Time 2536    PT Stop Time 1100    PT Time Calculation (min) 45 min    Equipment Utilized During Treatment Other (comment);Gait belt   slideboard   Activity Tolerance Patient tolerated treatment well    Behavior During Therapy WFL for tasks assessed/performed           Past Medical History:  Diagnosis Date  . Diabetes mellitus without complication ([place])   . History of cervical fracture     Past Surgical History:  Procedure Laterality Date  . CERVICAL SPINE SURGERY      There were no vitals filed for this visit.   Subjective Assessment - 01/05/20 1019    Subjective Wounds on feet continue to heal; wound on L lateral leg is getting better as well. Pt reports doing exercises 2-3x per week when he has someone to help him with them. No trouble sleeping and no trouble with new exercise in L sidelying. Pt reports having mutliple injections in his back on Monday - no soreness. Pt reports that he feels better and more relaxed.    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 DM2    Patient Stated Goals Pt wants to be able to do  exercises so he can move more and not feel so tight.    Currently in Pain? No/denies                             [facility] Adult PT Treatment/Exercise - 01/05/20 1100      Bed Mobility   Bed Mobility Rolling Left;Sit to Supine;Supine to Sit;Rolling Right;Sit to Sidelying Right;Sitting - Scoot to Edge of Bed    Rolling Right Maximal Assistance - Patient 25-49%;2 Helpers    Rolling Left Moderate Assistance - Patient 50-74%    Right Sidelying to Sit Total Assistance - Patient < 25%    Supine to Sit Total Assistance - Patient < 25%    Sitting - Scoot to Edge of Bed Maximal Assistance - Patient 25-49%    Sit to Supine Moderate Assistance - Patient 50-74%    Sit to Sidelying Right Moderate Assistance - Patient 50-74%      Transfers   Transfers Lateral/Scoot Transfers    Lateral/Scoot Transfers 3: Mod assist;With slide board    Lateral/Scoot Transfer Details (indicate cue type and reason) From w/c > mat to L side with feet supported and PT blocking bil knees and feet; therapist initially mostly only assisted with maintaining anterior lean and balance, however pt's hips began to slide forward during transfer  and pt was too close to EOB to PT had to manually scoot pt back using opposite weight shift and manually picking up hip/LE to push posteriorly onto mat using mod-max A +1; also provided some assistance for repositioning LE but pt able to perform 90% of scooting across board.  Once pt was on mat he required mod A to complete scooting and then to scoot hips back on mat      Therapeutic Activites    Therapeutic Activities Other Therapeutic Activities    Other Therapeutic Activities PT worked on R rolling x5 reps with pt needed mod A for initial rep but only min A after PT initiated rocking and counting to 3 to help pt move with momentum. Pt instructed in L rolling x2 reps with min-mod A. Supine to long sit max assist of 2 with PT pulling on PT arms and PT student assisting behind  moderately to come up to long sit. In long sit worked on reaching forward to try to find balance point. PT behind providing min assist to stretch forward. Worked on trying to prop on arms in long sit in order to keep balance on his own.      Neuro Re-ed    Neuro Re-ed Details  Sitting edge of mat with supervising PT behind pt on large physioball: Pt worked on going down laterally to mat on alternating elbows and back up to midline x10 reps bil with PT in front to guard knees and supervision PT in back guarding pt's trunk. PT then worked on' reaching in varying directions and across body x 20 reps. Less stable with reaching out to right. PT in front to block bil knees and feet. Leaning back on large physioball about 20 degrees using arms to reach back to try to control descent and then reaching forward and pulling lightly on EOB to come upright x10 reps. Pt transitioned supine to long sit max assist +2. Pt able to assist with pulling on therapists arms like a bar to rise. In long sit position performed some self stretching of hamstrings holding to leg to provide some support with PT behind CGA. Then in long sit position worked on sitting balance with PT providing CGA-min assist for stability x 3 min.      Exercises   Exercises Other Exercises    Other Exercises  Serratus anterior push-ups in prone working on scapulat stabilization x10 reps with PT providing VC and TCs. unilateral butterfly exercise working on scapular retraction to promote more mid-lower trap and rhomboid activation x 6 reps bilaterally.  PT providing tactile cues b/ shoulder blades to promote optimal mm activation.                    PT Short Term Goals - 12/15/19 1259      PT SHORT TERM GOAL #1   Title Pt and wife will be instructed in HEP for BLE stretching to ensure they are performing correctly. (due by 3rd visit)    Baseline PT has been reissued HEP for stretching and started performing with family. 12/15/19    Time 3      Period Weeks    Status Achieved    Target Date 12/15/19      PT SHORT TERM GOAL #2   Title Pt will be able to perform slideboard transfer max assist of 1 for improved mobility    Baseline Max assist +1 with second person still for safety 12/15/19    Time 3  Status Partially Met    Target Date 12/15/19      PT SHORT TERM GOAL #3   Title Pt will be instructed in ways to better manage swelling in legs and pressure relief to prevent further injury.    Baseline 12/15/19 Pt is wearing procare boots to offload heels to protect. Has been instructed in importance of elevating legs more to help with swelling.    Time 3    Period Weeks    Status Achieved    Target Date 12/15/19      PT SHORT TERM GOAL #4   Title Pt will be able to maintain long sit supervision x 2 min to assist with ADLs.    Baseline unable to assess at eval but limited HS length will challenge long sit. 12/15/19 Pt needs CGA/min assist to maintain long sit    Time 3    Period Weeks    Status Partially Met    Target Date 12/15/19             PT Long Term Goals - 11/16/19 2059      PT LONG TERM GOAL #1   Title Pt will be able to perform slideboard transfer min assist once board is placed for improved mobility.    Baseline max assist +2    Time 8    Period Weeks    Status New    Target Date 01/10/20      PT LONG TERM GOAL #2   Title Pt will transition from long sit to short sit min assist for improved mobility.    Baseline currently hoyer lift transfer at home.    Time 8    Period Weeks    Status New    Target Date 01/10/20      PT LONG TERM GOAL #3   Title Patient will perform long sit/circle without UE support >/= 5 minutes with min guard for improved ability to assist with ADLs in bed.    Baseline unable to assess at eval but has limited hamstring length that will affect long sitting balance.    Time 8    Period Weeks    Status New    Target Date 01/10/20      PT LONG TERM GOAL #4   Title Pt will  perform rolling in bed mod assist for improved mobility.    Baseline max assist    Time 8    Period Weeks    Status New    Target Date 01/10/20                 Plan - 01/05/20 1712    Clinical Impression Statement Today's skilled PT session focused on continued training in sitting balance in long-sitting, dynamic balance with reaching and lean anterior/posteriorly in short sitting with additional PT on physioball behind for support, and training in bed mobility as well as some scapular stabilization training in prone with face cut-out wedge for support. Pt is progressing in sitting balance and ability to stabilize himself in long- and short-sit. PT will continue to progress as tolerated and continue to porgress towards LTGs.    Personal Factors and Comorbidities Comorbidity 2    Comorbidities history of DVTs, DM2    Examination-Activity Limitations Bathing;Bed Mobility;Transfers;Dressing    Examination-Participation Restrictions Community Activity    Stability/Clinical Decision Making Evolving/Moderate complexity    Rehab Potential Good    PT Frequency 1x / week   plus eval   PT Duration 8 weeks  PT Treatment/Interventions ADLs/Self Care Home Management;Cryotherapy;DME Instruction;Moist Heat;Functional mobility training;Therapeutic activities;Therapeutic exercise;Balance training;Patient/family education;Neuromuscular re-education;Manual techniques;Wheelchair mobility training;Passive range of motion    PT Next Visit Plan Update HEP as needed.  SCI-FIT. Slideboard transfers, sitting balance in short sit and long sit, bed mobility.    Consulted and Agree with Plan of Care Patient;Family member/caregiver    Family Member Consulted wife           Patient will benefit from skilled therapeutic intervention in order to improve the following deficits and impairments:  Decreased balance, Decreased mobility, Decreased knowledge of use of DME, Decreased activity tolerance, Decreased range  of motion, Impaired flexibility, Decreased strength, Increased edema, Impaired sensation, Impaired tone, Impaired UE functional use  Visit Diagnosis: Quadriplegia, C5-C7 complete ([facility])  Muscle weakness (generalized)     Problem List Patient Active Problem List   Diagnosis Date Noted  . At risk for unstable body temperature 11/26/2019  . Wheelchair dependence 11/26/2019  . Pressure ulcer 09/22/2019  . Cellulitis 09/22/2019  . Hypokalemia 09/22/2019  . Spasticity 03/08/2019  . Erectile dysfunction due to diseases classified elsewhere 03/08/2019  . 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 Neurogenic bowel 01/22/2019  . Neurogenic bladder 01/22/2019  . Myofascial pain 01/22/2019  . Spinal cord injury at T1-T6 level ([place]) 12/30/2018  . Quadriplegia ([place]) 12/30/2018    Rosalita Levan, SPT 01/05/2020, 5:27 PM  [facility] [address] [place], [place], [address] Phone: [phone]   Fax:  [phone]  Name: [name] MRN: [id number] Date of Birth: 1965/07/31

## 2020-01-10 ENCOUNTER — Other Ambulatory Visit: Payer: Self-pay

## 2020-01-10 ENCOUNTER — Encounter: Payer: Self-pay | Admitting: Physical Therapy

## 2020-01-10 ENCOUNTER — Ambulatory Visit: Payer: Medicaid Other | Admitting: Occupational Therapy

## 2020-01-10 ENCOUNTER — Ambulatory Visit: Payer: Medicaid Other | Admitting: Physical Therapy

## 2020-01-10 DIAGNOSIS — R278 Other lack of coordination: Secondary | ICD-10-CM

## 2020-01-10 DIAGNOSIS — R293 Abnormal posture: Secondary | ICD-10-CM

## 2020-01-10 DIAGNOSIS — M25611 Stiffness of right shoulder, not elsewhere classified: Secondary | ICD-10-CM

## 2020-01-10 DIAGNOSIS — M6281 Muscle weakness (generalized): Secondary | ICD-10-CM

## 2020-01-10 DIAGNOSIS — G8253 Quadriplegia, C5-C7 complete: Secondary | ICD-10-CM

## 2020-01-10 DIAGNOSIS — M25612 Stiffness of left shoulder, not elsewhere classified: Secondary | ICD-10-CM

## 2020-01-10 DIAGNOSIS — R29818 Other symptoms and signs involving the nervous system: Secondary | ICD-10-CM

## 2020-01-10 DIAGNOSIS — R208 Other disturbances of skin sensation: Secondary | ICD-10-CM

## 2020-01-10 NOTE — Therapy (Addendum)
[facility] [address] [place], [place], [address] Phone: [phone]   Fax:  [phone]  Physical Therapy Treatment and Recertification  Patient Details  Name: [name] MRN: [id number] Date of Birth: 1965-09-02 Referring Provider (PT): Dr. Dagoberto Ligas   Encounter Date: 01/10/2020   PT End of Session - 01/10/20 1128    Visit Number 7    Number of Visits 9    Date for PT Re-Evaluation 01/10/20    Authorization Type [employer] Medicaid - 27 OT/PT/ST  No Submission needed at this time per    Authorization - Visit Number 7    Authorization - Number of Visits 27    PT Start Time 1016    PT Stop Time 1101    PT Time Calculation (min) 45 min    Equipment Utilized During Treatment Other (comment);Gait belt   slideboard   Activity Tolerance Patient tolerated treatment well    Behavior During Therapy WFL for tasks assessed/performed           Past Medical History:  Diagnosis Date   Diabetes mellitus without complication ([place])    History of cervical fracture     Past Surgical History:  Procedure Laterality Date   CERVICAL SPINE SURGERY      There were no vitals filed for this visit.   Subjective Assessment - 01/10/20 1022    Subjective Wounds on feet continue to heal; wound on L lateral leg is getting better as well. No trouble sleeping and no trouble with new exercise in L sidelying and has been able to get in better position to offload LLE wound.    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 DM2    Patient Stated Goals Pt wants to be able to do exercises so he can move more and not feel so tight.    Currently in Pain? No/denies                              [facility] Adult PT Treatment/Exercise - 01/10/20 1115      Bed Mobility   Bed Mobility Rolling Right;Right Sidelying to Sit;Sitting - Scoot to Edge of Bed;Sit to Supine;Sit to Sidelying Right    Rolling Right Minimal Assistance - Patient > 75%;Moderate Assistance - Patient 50-74%    Right Sidelying to Sit Maximal Assistance - Patient 25-49%    Supine to Sit Maximal Assistance - Patient - Patient 25-49%    Sitting - Scoot to Edge of Bed Maximal Assistance - Patient 25-49%    Sit to Supine Moderate Assistance - Patient 50-74%    Sit to Sidelying Right Moderate Assistance - Patient 50-74%      Transfers   Transfers Lateral/Scoot Transfers    Lateral/Scoot Transfers 4: Min assist;3: Mod assist;With slide board    Lateral/Scoot Transfer Details (indicate cue type and reason) PT flipped up arm rest and removed hip abduction support on chair prior to placing slideboard. He was able to lean to the right to allow PT to place board with PT lifting up left leg as well. Pt able to utilize arms and head to get more twisting motion to gradually scoot over on board using arms to assist with min-mod assist today.  PT lowered table so gravity could assist with had pt lean over PT's left shoulder whne scooting toward R from power chair to mat. At end of session when going from mat>power chair, PT raised table for easier tranfer and placed 6" step under pt's feet to help with stability during tranfer. Pt min A going back to chair.      Therapeutic Activites    Therapeutic Activities Other Therapeutic Activities    Other Therapeutic Activities PT worked on R rolling x6 reps with pt needed mod A for initial rep but only min A after PT initiated rocking and counting to 3 to help pt move with momentum.       Neuro Re-ed    Neuro Re-ed Details  Sitting edge of mat with supervising PT behind pt on large physioball: Pt worked on going down laterally to pillow on mat on alternating  elbows and back up to midline x10 reps with PT in front to guard knees and supervision PT in back guarding pt's trunk. PT then worked on' reaching in varying directions, above head, and across body x 20 reps. Less stable with reaching out to right. PT in front to block bil knees and feet. Leaning back on large physioball about 20 degrees using arms to reach back to try to control descent and then reaching forward and pulling lightly on EOB with contralateral arm to come forward reaching for PT target in different planes including reaching across body x10 reps. Pt transitioned supine to long sit max assist +2. In long sit position performed some self stretching of hamstrings holding to leg to provide some support with PT behind CGA. Then in long sit position worked on sitting balance with tossing/catching ball x 20 reps with PT providing mod assist for stability.                    PT Short Term Goals - 01/10/20 1141      PT SHORT TERM GOAL #1   Title Pt and wife will be instructed in HEP for BLE stretching to ensure they are performing correctly. (due by 3rd visit)    Baseline PT has been reissued HEP for stretching and started performing with family. 12/15/19    Time 3    Period Weeks    Status Achieved    Target Date 12/15/19      PT SHORT TERM GOAL #2   Title Pt will be able to perform slideboard transfer max assist of 1 for improved mobility    Baseline Max assist +1 with second person still for safety 12/15/19; Mod Assist +1 with second person for safety going to mat but no second person needed going back to chair.    Time 3    Period Weeks    Status Partially Met    Target Date 12/15/19      PT SHORT TERM GOAL #3   Title Pt will be instructed in ways to better manage swelling in legs and pressure relief to prevent further injury.    Baseline 12/15/19 Pt is wearing procare boots to offload heels to protect. Has been instructed in importance of elevating legs more to help with  swelling.    Time 3    Period Weeks    Status Achieved    Target Date 12/15/19      PT SHORT TERM GOAL #4   Title Pt will be able to maintain long sit supervision x 2 min to assist with ADLs.  Baseline Pt needs min-mod assist to maintain long sit 01/10/20    Time 3    Period Weeks    Status Not Met    Target Date 12/15/19             PT Long Term Goals - 01/10/20 1145      PT LONG TERM GOAL #1   Title Pt will be able to perform slideboard transfer min assist once board is placed for improved mobility.    Baseline Mod Assist +1 with second person for safety going to mat but no second person needed going back to chair.    Time 8    Period Weeks    Status On-going      PT LONG TERM GOAL #2   Title Pt will transition from long sit to short sit min assist for improved mobility.    Baseline Max A for long-sit to short-sit 01/10/20    Time 8    Period Weeks    Status On-going      PT LONG TERM GOAL #3   Title Patient will perform long sit/circle without UE support >/= 5 minutes with min guard for improved ability to assist with ADLs in bed.    Baseline ~3 minutes with min A at this time 01/10/20    Time 8    Period Weeks    Status On-going      PT LONG TERM GOAL #4   Title Pt will perform rolling in bed mod assist for improved mobility.    Baseline Min A for rolling L and R 01/10/20    Time 8    Period Weeks    Status Achieved            New goals for recertification:  PT Short Term Goals - 01/10/20 1158      PT SHORT TERM GOAL #1   Title = LTG           PT Long Term Goals - 01/10/20 1158      PT LONG TERM GOAL #1   Title Pt will be able to perform slideboard transfer min assist once board is placed for improved mobility.    Baseline Mod Assist +1 with second person for safety going to mat but no second person needed going back to chair.    Time 4    Period Weeks    Status Revised    Target Date 02/09/20      PT LONG TERM GOAL #2   Title Pt will  transition from long sit to short sit min assist for improved mobility.    Baseline Max A for long-sit to short-sit 01/10/20    Time 4    Period Weeks    Status Revised    Target Date 02/09/20      PT LONG TERM GOAL #3   Title Patient will perform long sit/circle without UE support >/= 5 minutes with min guard for improved ability to assist with ADLs in bed.    Baseline ~3 minutes with min A at this time 01/10/20    Time 4    Period Weeks    Status Revised    Target Date 02/09/20      PT LONG TERM GOAL #4   Title Pt will perform rolling in bed mod assist for improved mobility.    Baseline Min A for rolling L and R 01/10/20    Status Achieved  Plan - 01/10/20 1129    Clinical Impression Statement Today's skilled PT session focused on continued training in sitting balance in short- and long-sitting, dynamic balance with reaching in short sitting and tossing/catching a ball in long-sitting, and training in bed mobility. Pt is progressing in sitting balance and ability to stabilize himself in long- and short-sit. Pt is currently progressing well towards goals only needing min-mod A for slideboard transfers as well as only min A for rolling L and R. PT will continue to progress as tolerated and continue to porgress towards LTGs.    Personal Factors and Comorbidities Comorbidity 2    Comorbidities history of DVTs, DM2    Examination-Activity Limitations Bathing;Bed Mobility;Transfers;Dressing    Examination-Participation Restrictions Community Activity    Stability/Clinical Decision Making Evolving/Moderate complexity    Rehab Potential Good    PT Frequency 1x / week   plus eval   PT Duration 4 weeks    PT Treatment/Interventions ADLs/Self Care Home Management;Cryotherapy;DME Instruction;Moist Heat;Functional mobility training;Therapeutic activities;Therapeutic exercise;Balance training;Patient/family education;Neuromuscular re-education;Manual techniques;Wheelchair  mobility training;Passive range of motion    PT Next Visit Plan Update HEP as needed.  SCI-FIT. Slideboard transfers, sitting balance in short sit and long sit, bed mobility.    Consulted and Agree with Plan of Care Patient;Family member/caregiver    Family Member Consulted wife           Patient will benefit from skilled therapeutic intervention in order to improve the following deficits and impairments:  Decreased balance, Decreased mobility, Decreased knowledge of use of DME, Decreased activity tolerance, Decreased range of motion, Impaired flexibility, Decreased strength, Increased edema, Impaired sensation, Impaired tone, Impaired UE functional use  Visit Diagnosis: Quadriplegia, C5-C7 complete (HCC)  Muscle weakness (generalized)  Other symptoms and signs involving the nervous system  Other disturbances of skin sensation  Abnormal posture     Problem List Patient Active Problem List   Diagnosis Date Noted   At risk for unstable body temperature 11/26/2019   Wheelchair dependence 11/26/2019   Pressure ulcer 09/22/2019   Cellulitis 09/22/2019   Hypokalemia 09/22/2019   Spasticity 03/08/2019   Erectile dysfunction due to diseases classified elsewhere 03/08/2019   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/22/2019   Neurogenic bowel 01/22/2019   Neurogenic bladder 01/22/2019   Myofascial pain 01/22/2019   Spinal cord injury at T1-T6 level ([place]) 12/30/2018   Quadriplegia ([place]) 12/30/2018    Rosalita Levan, SPT 01/10/2020, 11:51 AM  [facility] [address] [place], [place], [address] Phone: [phone]   Fax:  [phone]  Name: Will Heinkel MRN: [id number] Date of Birth: 07-27-1965

## 2020-01-10 NOTE — Therapy (Signed)
[facility] [address] [place], [place], [address] Phone: [phone]   Fax:  [phone]  Occupational Therapy Treatment  Patient Details  Name: [name] MRN: [id number] Date of Birth: 20-May-1965 Referring Provider (OT): Dr. Berline Chough   Encounter Date: 01/10/2020   OT End of Session - 01/10/20 1106    Visit Number 6    Number of Visits 13    Date for OT Re-Evaluation 03/09/19    Authorization Type Medicaid    Authorization - Visit Number 6    Authorization - Number of Visits 12    OT Start Time 1103    OT Stop Time 1145    OT Time Calculation (min) 42 min    Activity Tolerance Patient tolerated treatment well    Behavior During Therapy [facility] for tasks assessed/performed           Past Medical History:  Diagnosis Date  . Diabetes mellitus without complication (HCC)   . History of cervical fracture     Past Surgical History:  Procedure Laterality Date  . CERVICAL SPINE SURGERY      There were no vitals filed for this visit.   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 DM    Limitations Fall Risk. x2 transfers    Patient Stated Goals get better    Currently in Pain? No/denies            Treatment:  Hand gripper with LUE level 1 with spring. Pick up 1 inch blocks.  Copying medium peg board with LUE - difficulty with blue/purple differentiation but otherwise copied pattern with no other errors or difficulty. Pt self-corrected errors. Min drops with LUE with placing pegs. Pt pulled pegs out with LUE for increase in grip strength.  ER/IR with  unweighted cane x 10  Donned and doffed long sleeve shirt with min A for pulling down shirt in the back only.    In hand coordination with hacky sack turning counterclockwise and clockwise. Pt with closed hand space with task. Pt toss in LUE and in between two hands this day with 1-2 drops only.                      OT Short Term Goals - 01/10/20 1106      OT SHORT TERM GOAL #1   Title I with inital HEP-    Baseline dependent    Time 6    Period Weeks    Status New    Target Date 12/31/19      OT SHORT TERM GOAL #2   Title Pt will donn shirt with min A    Baseline mod A    Time 6    Period Weeks    Status On-going      OT SHORT TERM GOAL #3   Title Pt will report bilateral UE pain less than or equal to 4/10 during ADLS/ functional use.    Baseline bilateral UE pain 3-6/10    Time 6    Period Weeks    Status New      OT SHORT TERM GOAL #4  Title Pt will increase LUE grip strength to 17 lbs or greater for increased functional use.    Baseline RUE 30.4 lbs ,LUE 12.1 lbs    Time 6    Period Weeks    Status On-going   9.7     OT SHORT TERM GOAL #5   Title Pt will demonstrate improved LUE fine motor coordination for ADLs as evidenced by decreasing 9 hole peg test score to 80 secs or less.    Baseline RUE 34.22, LUE 1 min 31 secs    Time 6    Period Weeks    Status New      OT SHORT TERM GOAL #6   Title Pt will perform sliding board transfers with max A  for increased I with ADLs/ toilet transfers prn    Baseline toatl A +2 per PT report    Time 6    Period Weeks    Status On-going      OT SHORT TERM GOAL #7   Title Pt / family will verbalize understanding of pain reduction strategies for UE's.    Baseline pain 3-6/10 for shoulders, hands, needs education    Time 6    Period Weeks    Status New             OT Long Term Goals - 01/05/20 1158      OT LONG TERM GOAL #1   Title Pt will donn shirt with setup    Baseline mod A    Time 12     Period Weeks    Status New      OT LONG TERM GOAL #2   Title Pt will demonstrate improved bilateral UE external rotation to [facility]  as evidenced by pt's  increased ease with supporting himself in long and circle sitting, with pain less than or equal to 3/10.    Baseline limited bilateral external rotation in shoulders, pain 3-6/10 in UE's    Time 12    Period Weeks    Status On-going      OT LONG TERM GOAL #3   Title Pt will demonstrate improved LUE fine motor coordination as evidenced  by decreasing 9 hole peg test score to 70 secs or less.    Baseline RUE 34.22, LUE 1 min 31 secs    Time 12    Period Weeks    Status New      OT LONG TERM GOAL #4   Title Pt/ family witllverbalize understanding of adapted strategies to maximize pt's I with ADLs/ IADLs.    Baseline needs updates/ reinforcement of strategies as pt is not perfroming consistently.    Time 12    Period Weeks    Status New      OT LONG TERM GOAL #5   Title Pt will increase RUE grip to 36 lbs and LUE grip to 20 lbs or greater in prep for bilateral functional use.    Baseline RUE 30.4, LUE 12.1    Time 12    Period Weeks    Status New      OT LONG TERM GOAL #6   Title Pt will perform sliding board transfers to assist with ADLS and in prep for toilet transfers with mod A to level surface.    Baseline total A +2    Time 12    Period Weeks    Status New                 Plan -  01/10/20 1216    Clinical Impression Statement Pt is progressing towards goals. Pt has reported and demonstrated increased participation and independence with ADLs.    OT Occupational Profile and History Detailed Assessment- Review of Records and additional review of physical, cognitive, psychosocial history related to current functional performance    Occupational performance deficits (Please refer to evaluation for details): ADL's;IADL's;Work;Leisure;Social Participation;Rest and Sleep;Play    Body Structure / Function / Physical Skills  ADL;UE functional use;Endurance;Balance;Flexibility;Pain;FMC;ROM;GMC;Coordination;Sensation;Decreased knowledge of precautions;Decreased knowledge of use of DME;IADL;Strength;Dexterity;Mobility;Tone    Rehab Potential Good    Clinical Decision Making Limited treatment options, no task modification necessary    Comorbidities Affecting Occupational Performance: May have comorbidities impacting occupational performance    Modification or Assistance to Complete Evaluation  No modification of tasks or assist necessary to complete eval    OT Frequency 1x / week   plus eval   OT Duration 12 weeks    OT Treatment/Interventions Self-care/ADL training;Ultrasound;Energy conservation;Aquatic Therapy;DME and/or AE instruction;Patient/family education;Paraffin;Gait Training;Passive range of motion;Balance training;Fluidtherapy;Cryotherapy;Splinting;Building services engineer;Contrast Bath;Electrical Stimulation;Moist Heat;Therapeutic exercise;Manual Therapy;Therapeutic activities;Cognitive remediation/compensation;Neuromuscular education    Plan UB strengthening, grip strengthening, transfers    Consulted and Agree with Plan of Care Patient;Family member/caregiver    Family Member Consulted wife,interpreter           Patient will benefit from skilled therapeutic intervention in order to improve the following deficits and impairments:   Body Structure / Function / Physical Skills: ADL, UE functional use, Endurance, Balance, Flexibility, Pain, FMC, ROM, GMC, Coordination, Sensation, Decreased knowledge of precautions, Decreased knowledge of use of DME, IADL, Strength, Dexterity, Mobility, Tone       Visit Diagnosis: Stiffness of right shoulder, not elsewhere classified  Muscle weakness (generalized)  Other lack of coordination  Stiffness of left shoulder, not elsewhere classified    Problem List Patient Active Problem List   Diagnosis Date Noted  . At risk for unstable body temperature  11/26/2019  . Wheelchair dependence 11/26/2019  . Pressure ulcer 09/22/2019  . Cellulitis 09/22/2019  . Hypokalemia 09/22/2019  . Spasticity 03/08/2019  . Erectile dysfunction due to diseases classified elsewhere 03/08/2019  . 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 Neurogenic bowel 01/22/2019  . Neurogenic bladder 01/22/2019  . Myofascial pain 01/22/2019  . 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 Quadriplegia [facility]) 12/30/2018    Junious Dresser MOT, OTR/L  01/10/2020, 12:18 PM  [facility] [address] [place], [place], [address] Phone: [phone]   Fax:  [phone]  Name: [name] MRN: [id number] Date of Birth: 11/22/1965

## 2020-01-10 NOTE — Progress Notes (Signed)
ILIJAH, DOUCET ([id number]) Visit Report for 12/31/2019 SuperBill Details Patient Name: Date of Service: [name] 12/31/2019 Medical Record Number: [id number] Patient Account Number: [id number] Date of Birth/Sex: Treating RN: [birth year] (54 y.o. 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 Electronic Signature(s) Signed: 12/31/2019 12:02:00 PM By: Shawn Stall Signed: 01/10/2020 4:47:57 PM By: Baltazar Najjar MD Entered By: Shawn Stall on 12/31/2019 08:30:02

## 2020-01-10 NOTE — Addendum Note (Signed)
Addended by: Bufford Lope F on: 01/10/2020 12:03 PM   Modules accepted: Orders

## 2020-01-10 NOTE — Progress Notes (Signed)
VITALI, SEIBERT ([id number]) Visit Report for 01/04/2020 SuperBill Details Patient Name: Date of Service: [name] 01/04/2020 Medical Record Number: [id number] Patient Account Number: [id number] Date of Birth/Sex: Treating RN: 1965/03/04 (54 y.o. 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 Electronic Signature(s) Signed: 01/04/2020 4:11:31 PM By: Zandra Abts RN, BSN Signed: 01/10/2020 4:47:57 PM By: Baltazar Najjar MD Entered By: Zandra Abts on 01/04/2020 12:25:17

## 2020-01-14 ENCOUNTER — Encounter (HOSPITAL_BASED_OUTPATIENT_CLINIC_OR_DEPARTMENT_OTHER): Payer: Medicaid Other | Attending: Internal Medicine | Admitting: Internal Medicine

## 2020-01-14 ENCOUNTER — Other Ambulatory Visit: Payer: Self-pay

## 2020-01-14 DIAGNOSIS — L97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: Secondary | ICD-10-CM | POA: Insufficient documentation

## 2020-01-14 DIAGNOSIS — E11621 Type 2 diabetes mellitus with foot ulcer: Secondary | ICD-10-CM | POA: Insufficient documentation

## 2020-01-14 DIAGNOSIS — L97528 Non-pressure chronic ulcer of other part of left foot with other specified severity: Secondary | ICD-10-CM | POA: Insufficient documentation

## 2020-01-14 DIAGNOSIS — L97518 Non-pressure chronic ulcer of other part of right foot with other specified severity: Secondary | ICD-10-CM | POA: Diagnosis not present

## 2020-01-14 DIAGNOSIS — G8221 Paraplegia, complete: Secondary | ICD-10-CM | POA: Insufficient documentation

## 2020-01-14 NOTE — Progress Notes (Signed)
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 remaining wounds on the left fifth metatarsal head and the left lateral calf these are making good progress as well 2. He will need below-knee stockings on the right we did not wrap this leg. Hopefully this will not be a mistake. Electronic Signature(s) Signed: 01/14/2020 12:44:20 PM By: Linton Ham MD Entered By: Linton Ham on 01/14/2020 09:23:30 -------------------------------------------------------------------------------- SuperBill Details Patient Name: Date of Service: Corwin Levins, [place] S 01/14/2020 Medical Record Number: [id number] Patient Account Number: [id number] Date of Birth/Sex: Treating RN: 1966/01/12 (54 y.o. 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, complete Facility Procedures CPT4 Code: [id number] Description: (Facility Use Only) [phone] - [place] LWR LT LEG Modifier: Quantity: 1 Physician Procedures : CPT4 Code Description Modifier [id number] - WC PHYS LEVEL 3 - EST PT ICD-10 Diagnosis Description L97.528 Non-pressure chronic ulcer of other part of left foot with other specified severity L97.

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
## 2020-01-18 ENCOUNTER — Ambulatory Visit: Payer: Medicaid Other

## 2020-01-18 ENCOUNTER — Encounter: Payer: Medicaid Other | Admitting: Occupational Therapy

## 2020-01-21 ENCOUNTER — Encounter: Payer: Medicaid Other | Admitting: Occupational Therapy

## 2020-01-21 ENCOUNTER — Ambulatory Visit: Payer: Medicaid Other

## 2020-01-21 ENCOUNTER — Other Ambulatory Visit: Payer: Self-pay

## 2020-01-21 ENCOUNTER — Encounter: Payer: Self-pay | Admitting: Occupational Therapy

## 2020-01-21 ENCOUNTER — Encounter: Payer: Self-pay | Admitting: Physical Therapy

## 2020-01-21 ENCOUNTER — Ambulatory Visit: Payer: Medicaid Other | Attending: Physical Medicine and Rehabilitation | Admitting: Physical Therapy

## 2020-01-21 ENCOUNTER — Ambulatory Visit: Payer: Medicaid Other | Admitting: Occupational Therapy

## 2020-01-21 ENCOUNTER — Encounter (HOSPITAL_BASED_OUTPATIENT_CLINIC_OR_DEPARTMENT_OTHER): Payer: Medicaid Other | Admitting: Internal Medicine

## 2020-01-21 DIAGNOSIS — M25612 Stiffness of left shoulder, not elsewhere classified: Secondary | ICD-10-CM | POA: Insufficient documentation

## 2020-01-21 DIAGNOSIS — G8929 Other chronic pain: Secondary | ICD-10-CM | POA: Insufficient documentation

## 2020-01-21 DIAGNOSIS — M25611 Stiffness of right shoulder, not elsewhere classified: Secondary | ICD-10-CM | POA: Insufficient documentation

## 2020-01-21 DIAGNOSIS — R278 Other lack of coordination: Secondary | ICD-10-CM

## 2020-01-21 DIAGNOSIS — M25512 Pain in left shoulder: Secondary | ICD-10-CM | POA: Diagnosis present

## 2020-01-21 DIAGNOSIS — G8252 Quadriplegia, C1-C4 incomplete: Secondary | ICD-10-CM | POA: Insufficient documentation

## 2020-01-21 DIAGNOSIS — M6281 Muscle weakness (generalized): Secondary | ICD-10-CM

## 2020-01-21 DIAGNOSIS — M25511 Pain in right shoulder: Secondary | ICD-10-CM | POA: Diagnosis present

## 2020-01-21 DIAGNOSIS — R208 Other disturbances of skin sensation: Secondary | ICD-10-CM | POA: Insufficient documentation

## 2020-01-21 DIAGNOSIS — R29818 Other symptoms and signs involving the nervous system: Secondary | ICD-10-CM | POA: Insufficient documentation

## 2020-01-21 DIAGNOSIS — E11621 Type 2 diabetes mellitus with foot ulcer: Secondary | ICD-10-CM | POA: Diagnosis not present

## 2020-01-21 DIAGNOSIS — R293 Abnormal posture: Secondary | ICD-10-CM | POA: Diagnosis present

## 2020-01-21 NOTE — Therapy (Signed)
[facility] [address] [place], [place], [address] Phone: [phone]   Fax:  [phone]  Physical Therapy Treatment  Patient Details  Name: [name] MRN: [id number] Date of Birth: 1965/05/12 Referring Provider (PT): Dr. Berline Chough   Encounter Date: 01/21/2020   PT End of Session - 01/21/20 0903    Visit Number 8    Number of Visits 12    Date for PT Re-Evaluation 02/09/20    Authorization Type U[employer] Medicaid - 27 OT/PT/ST  No Submission needed at this time per    Authorization - Visit Number 8    Authorization - Number of Visits 27    PT Start Time 0806    PT Stop Time 0846    PT Time Calculation (min) 40 min    Activity Tolerance Patient tolerated treatment well    Behavior During Therapy [facility] for tasks assessed/performed           Past Medical History:  Diagnosis Date  . Diabetes mellitus without complication (HCC)   . History of cervical fracture     Past Surgical History:  Procedure Laterality Date  . CERVICAL SPINE SURGERY      There were no vitals filed for this visit.   Subjective Assessment - 01/21/20 0855    Subjective R foot wound has completely closed and has been D/C; only need 2-3 more visits for LLE.  Nothing else new to report.    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 DM2    Patient Stated Goals Pt wants to be able to do exercises so he can move more and not feel so tight.    Currently in Pain? No/denies                             [facility] Adult PT Treatment/Exercise - 01/21/20 0856      Bed Mobility   Bed Mobility Supine to Sit;Sit to Supine    Supine to Sit 2 Helpers   to long sitting; +2 to pull up on   Sit to  Supine 2 Helpers   to lower from long sitting with control     Transfers   Transfers Lateral/Scoot Transfers    Lateral/Scoot Transfers 4: Min assist;With [employer] operational officer Details (indicate cue type and reason) w/c > mat to L side; therapist assisted with scooting L hip forwards and then placing slideboard under LE.  Therapist provided support in front at knees to prevent sliding forwards off board.    Comments Once in short sitting performed transfer from short sitting > long sitting with mod A; pt performed lateral leaning while therapist raised each LE on to mat; pt able to stay in long sitting.  In long sitting pt able to push legs over to middle of mat and to assist with lifting buttocks pushing through bilat UE to scoot more to middle of mat      Therapeutic Activites    Therapeutic Activities ADL's;Other Therapeutic Activities    ADL's In long sitting provided pt with long yellow theraband loop to simulate donning and doffing pants.  Therapist provided 25% assistance to thread loop over each foot and to pull up from under  sacrum; pt utilized lateral leaning to pull loop up and then back down LE.    Other Therapeutic Activities After stretching hips in supine returned to long sitting and transitioned to circle sitting with max A and focused on patient leaning forwards and holding balance without back support; required assistance to keep LE in circle position      Exercises   Exercises Other Exercises    Other Exercises  In supine performed R and L hip flexion, knee flexion and hip IR and ER PROM/stretching to prepare for circle sitting                    PT Short Term Goals - 01/10/20 1158      PT SHORT TERM GOAL #1   Title = LTG             PT Long Term Goals - 01/10/20 1158      PT LONG TERM GOAL #1   Title Pt will be able to perform slideboard transfer min assist once board is placed for improved mobility.    Baseline Mod Assist +1 with second  person for safety going to mat but no second person needed going back to chair.    Time 4    Period Weeks    Status Revised    Target Date 02/09/20      PT LONG TERM GOAL #2   Title Pt will transition from long sit to short sit min assist for improved mobility.    Baseline Max A for long-sit to short-sit 01/10/20    Time 4    Period Weeks    Status Revised    Target Date 02/09/20      PT LONG TERM GOAL #3   Title Patient will perform long sit/circle without UE support >/= 5 minutes with min guard for improved ability to assist with ADLs in bed.    Baseline ~3 minutes with min A at this time 01/10/20    Time 4    Period Weeks    Status Revised    Target Date 02/09/20      PT LONG TERM GOAL #4   Title Pt will perform rolling in bed mod assist for improved mobility.    Baseline Min A for rolling L and R 01/10/20    Status Achieved                 Plan - 01/21/20 0904    Clinical Impression Statement Continued to progress functional mobility with pt requiring only min A for slideboard transfer to mat and mod A to transition from short sitting > long sitting.  Pt able to maintain balance in long sitting and perform simulated ADL activity but continues to require assistance to maintain circle sitting.  Will continue to address and progress towards LTG.    Personal Factors and Comorbidities Comorbidity 2    Comorbidities history of DVTs, DM2    Examination-Activity Limitations Bathing;Bed Mobility;Transfers;Dressing    Examination-Participation Restrictions Community Activity    Stability/Clinical Decision Making Evolving/Moderate complexity    Rehab Potential Good    PT Frequency 1x / week   plus eval   PT Duration 4 weeks    PT Treatment/Interventions ADLs/Self Care Home Management;Cryotherapy;DME Instruction;Moist Heat;Functional mobility training;Therapeutic activities;Therapeutic exercise;Balance training;Patient/family education;Neuromuscular re-education;Manual  techniques;Wheelchair mobility training;Passive range of motion    PT Next Visit Plan Update HEP as needed.  SCI-FIT. Slideboard transfers, sitting balance in short sit and long sit, bed mobility.  Consulted and Agree with Plan of Care Patient;Family member/caregiver    Family Member Consulted wife           Patient will benefit from skilled therapeutic intervention in order to improve the following deficits and impairments:  Decreased balance,Decreased mobility,Decreased knowledge of use of DME,Decreased activity tolerance,Decreased range of motion,Impaired flexibility,Decreased strength,Increased edema,Impaired sensation,Impaired tone,Impaired UE functional use  Visit Diagnosis: Other symptoms and signs involving the nervous system  Other disturbances of skin sensation  Muscle weakness (generalized)  Abnormal posture  Quadriplegia, C1-C4 incomplete (HCC)     Problem List Patient Active Problem List   Diagnosis Date Noted  . At risk for unstable body temperature 11/26/2019  . Wheelchair dependence 11/26/2019  . Pressure ulcer 09/22/2019  . Cellulitis 09/22/2019  . Hypokalemia 09/22/2019  . Spasticity 03/08/2019  . Erectile dysfunction due to diseases classified elsewhere 03/08/2019  . 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 Neurogenic bowel 01/22/2019  . Neurogenic bladder 01/22/2019  . Myofascial pain 01/22/2019  . 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 Quadriplegia (HCC) 12/30/2018    Dierdre Highman, PT, DPT 01/21/20    9:07 AM    [facility] [address] [place], [place], [address] Phone: [phone]   Fax:  [phone]  Name: Ozan Maclay MRN: [id number] Date of Birth: 1965-09-30

## 2020-01-21 NOTE — Therapy (Signed)
[facility] [address] [place], [place], [address] Phone: [phone]   Fax:  [phone]  Occupational Therapy Treatment  Patient Details  Name: [name] MRN: [id number] Date of Birth: [birth year] Referring Provider (OT): Dr. Dagoberto Ligas   Encounter Date: 01/21/2020   OT End of Session - 01/21/20 0930    Visit Number 7    Number of Visits 13    Date for OT Re-Evaluation 03/09/19    Authorization Type Medicaid    Authorization - Visit Number 7    Authorization - Number of Visits 12    OT Start Time 0850    OT Stop Time 0930    OT Time Calculation (min) 40 min    Activity Tolerance Patient tolerated treatment well    Behavior During Therapy [facility] for tasks assessed/performed           Past Medical History:  Diagnosis Date  . Diabetes mellitus without complication ([place])   . History of cervical fracture     Past Surgical History:  Procedure Laterality Date  . CERVICAL SPINE SURGERY      There were no vitals filed for this visit.   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 DM    Limitations Fall Risk. x2 transfers    Patient Stated Goals get better    Currently in Pain? No/denies                       Treatment:Supine AROM  - shoulder flexion closed chain x 20 reps for gentle stretch Supine Shoulder horizontal abduction with red theraband 15 reps Supine with 4 lb dumbbell x 10 reps shoulder flexion, circumduction,  chest press, triceps extension with each UE individually Supine to sidelying with max A the sidelying to sit with total A(+2 assist for propping on left elbow then  sidelying to sit) Pt transferred with max A with assist of 2 and sliding board transfer Therapist checked 9 hole peg test for LUE, pt met STG              OT Short Term Goals - 01/21/20 1502      OT SHORT TERM GOAL #1   Title I with inital HEP-    Baseline dependent    Time 6    Period Weeks    Status On-going    Target Date 12/31/19      OT SHORT TERM GOAL #2   Title Pt will donn shirt with min A    Baseline mod A    Time 6    Period Weeks    Status On-going   mod A     OT SHORT TERM GOAL #3   Title Pt will report bilateral UE pain less than or equal to 4/10 during ADLS/ functional use.    Baseline bilateral UE pain 3-6/10    Time 6    Period Weeks    Status Achieved      OT SHORT TERM GOAL #4   Title Pt will increase LUE grip strength to 17 lbs or greater for increased functional use.    Baseline RUE 30.4 lbs ,LUE 12.1 lbs  Time 6    Period Weeks    Status On-going   9.7     OT SHORT TERM GOAL #5   Title Pt will demonstrate improved LUE fine motor coordination for ADLs as evidenced by decreasing 9 hole peg test score to 80 secs or less.    Baseline RUE 34.22, LUE 1 min 31 secs    Time 6    Period Weeks    Status Achieved   47.28 secs     OT SHORT TERM GOAL #6   Title Pt will perform sliding board transfers with max A  for increased I with ADLs/ toilet transfers prn    Baseline toatl A +2 per PT report    Time 6    Period Weeks    Status Achieved   mod A +1 and supervision of a second therapist for safety     OT SHORT TERM GOAL #7   Title Pt / family will verbalize understanding of pain reduction strategies for UE's.    Baseline pain 3-6/10 for shoulders, hands, needs education    Time 6    Period Weeks    Status On-going             OT Long Term Goals - 01/05/20 1158      OT LONG TERM GOAL #1   Title Pt will donn shirt with setup    Baseline mod A    Time 12    Period Weeks    Status New      OT LONG TERM GOAL #2   Title Pt will  demonstrate improved bilateral UE external rotation to [facility]  as evidenced by pt's  increased ease with supporting himself in long and circle sitting, with pain less than or equal to 3/10.    Baseline limited bilateral external rotation in shoulders, pain 3-6/10 in UE's    Time 12    Period Weeks    Status On-going      OT LONG TERM GOAL #3   Title Pt will demonstrate improved LUE fine motor coordination as evidenced  by decreasing 9 hole peg test score to 70 secs or less.    Baseline RUE 34.22, LUE 1 min 31 secs    Time 12    Period Weeks    Status New      OT LONG TERM GOAL #4   Title Pt/ family witllverbalize understanding of adapted strategies to maximize pt's I with ADLs/ IADLs.    Baseline needs updates/ reinforcement of strategies as pt is not perfroming consistently.    Time 12    Period Weeks    Status New      OT LONG TERM GOAL #5   Title Pt will increase RUE grip to 36 lbs and LUE grip to 20 lbs or greater in prep for bilateral functional use.    Baseline RUE 30.4, LUE 12.1    Time 12    Period Weeks    Status New      OT LONG TERM GOAL #6   Title Pt will perform sliding board transfers to assist with ADLS and in prep for toilet transfers with mod A to level surface.    Baseline total A +2    Time 12    Period Weeks    Status New                 Plan - 01/21/20 1501    Clinical Impression Statement Pt demonstrates overall progress towards  goals with improved fine motor coordination in LUE and decreased shoulder pain.    OT Occupational Profile and History Detailed Assessment- Review of Records and additional review of physical, cognitive, psychosocial history related to current functional performance    Occupational performance deficits (Please refer to evaluation for details): ADL's;IADL's;Work;Leisure;Social Participation;Rest and Sleep;Play    Body Structure / Function / Physical Skills ADL;UE functional  use;Endurance;Balance;Flexibility;Pain;FMC;ROM;GMC;Coordination;Sensation;Decreased knowledge of precautions;Decreased knowledge of use of DME;IADL;Strength;Dexterity;Mobility;Tone    Rehab Potential Good    Clinical Decision Making Limited treatment options, no task modification necessary    Comorbidities Affecting Occupational Performance: May have comorbidities impacting occupational performance    Modification or Assistance to Complete Evaluation  No modification of tasks or assist necessary to complete eval    OT Frequency 1x / week   plus eval   OT Duration 12 weeks    OT Treatment/Interventions Self-care/ADL training;Ultrasound;Energy conservation;Aquatic Therapy;DME and/or AE instruction;Patient/family education;Paraffin;Gait Training;Passive range of motion;Balance training;Fluidtherapy;Cryotherapy;Splinting;Therapist, nutritional;Contrast Bath;Electrical Stimulation;Moist Heat;Therapeutic exercise;Manual Therapy;Therapeutic activities;Cognitive remediation/compensation;Neuromuscular education    Plan work towards [employer], anticipate d/c at end of December    Consulted and Agree with Plan of Care Patient;Family member/caregiver    Family Member Consulted wife,interpreter           Patient will benefit from skilled therapeutic intervention in order to improve the following deficits and impairments:   Body Structure / Function / Physical Skills: ADL,UE functional use,Endurance,Balance,Flexibility,Pain,FMC,ROM,GMC,Coordination,Sensation,Decreased knowledge of precautions,Decreased knowledge of use of DME,IADL,Strength,Dexterity,Mobility,Tone       Visit Diagnosis: Stiffness of right shoulder, not elsewhere classified  Muscle weakness (generalized)  Stiffness of left shoulder, not elsewhere classified  Other lack of coordination    Problem List Patient Active Problem List   Diagnosis Date Noted  . At risk for unstable body temperature 11/26/2019  . Wheelchair  dependence 11/26/2019  . Pressure ulcer 09/22/2019  . Cellulitis 09/22/2019  . Hypokalemia 09/22/2019  . Spasticity 03/08/2019  . Erectile dysfunction due to diseases classified elsewhere 03/08/2019  . 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 Neurogenic bowel 01/22/2019  . Neurogenic bladder 01/22/2019  . Myofascial pain 01/22/2019  . Spinal cord injury at T1-T6 level ([place]) 12/30/2018  . Quadriplegia ([place]) 12/30/2018    Akya Fiorello 01/21/2020, 3:04 PM  [facility] [address] [place], [place], [address] Phone: [phone]   Fax:  [phone]  Name: [name] MRN: [id number] Date of Birth: [birth year]

## 2020-01-25 ENCOUNTER — Other Ambulatory Visit: Payer: Self-pay

## 2020-01-25 ENCOUNTER — Encounter: Payer: Self-pay | Admitting: Occupational Therapy

## 2020-01-25 ENCOUNTER — Ambulatory Visit: Payer: Medicaid Other

## 2020-01-25 ENCOUNTER — Ambulatory Visit: Payer: Medicaid Other | Admitting: Occupational Therapy

## 2020-01-25 DIAGNOSIS — M6281 Muscle weakness (generalized): Secondary | ICD-10-CM

## 2020-01-25 DIAGNOSIS — R29818 Other symptoms and signs involving the nervous system: Secondary | ICD-10-CM | POA: Diagnosis not present

## 2020-01-25 DIAGNOSIS — M25612 Stiffness of left shoulder, not elsewhere classified: Secondary | ICD-10-CM

## 2020-01-25 DIAGNOSIS — R208 Other disturbances of skin sensation: Secondary | ICD-10-CM

## 2020-01-25 DIAGNOSIS — M25611 Stiffness of right shoulder, not elsewhere classified: Secondary | ICD-10-CM

## 2020-01-25 DIAGNOSIS — R293 Abnormal posture: Secondary | ICD-10-CM

## 2020-01-25 DIAGNOSIS — R278 Other lack of coordination: Secondary | ICD-10-CM

## 2020-01-25 NOTE — Therapy (Signed)
[facility] [address] [place], [place], [address] Phone: [phone]   Fax:  [phone]  Physical Therapy Treatment  Patient Details  Name: [name] MRN: [id number] Date of Birth: [birth year] Referring Provider (PT): Dr. Berline Chough   Encounter Date: 01/25/2020   PT End of Session - 01/25/20 1105    Visit Number 9    Number of Visits 12    Date for PT Re-Evaluation 02/09/20    Authorization Type UHC Medicaid - 27 OT/PT/ST  No Submission needed at this time per    Authorization - Visit Number 9    Authorization - Number of Visits 27    PT Start Time 1101    PT Stop Time 1150    PT Time Calculation (min) 49 min    Activity Tolerance Patient tolerated treatment well    Behavior During Therapy [facility] for tasks assessed/performed           Past Medical History:  Diagnosis Date  . Diabetes mellitus without complication (HCC)   . History of cervical fracture     Past Surgical History:  Procedure Laterality Date  . CERVICAL SPINE SURGERY      There were no vitals filed for this visit.   Subjective Assessment - 01/25/20 1105    Subjective Pt just finished OT. Reports stretches are going well at home.    Patient is accompained by: Family member;Corporate treasurer, manuel.   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 DM2    Patient Stated Goals Pt wants to be able to do exercises so he can move more and not feel so tight.    Currently in Pain? No/denies                             [facility] Adult PT Treatment/Exercise - 01/25/20 1106      Bed Mobility   Bed Mobility Rolling Right;Supine to Sit      Transfers   Transfers Lateral/Scoot Transfers    Lateral/Scoot Transfers 4: Min assist;From elevated surface;With armrests  removed;With slide board   2nd person behind for safety mostly to hold slideboard   Lateral/Scoot Transfer Details (indicate cue type and reason) w/c to/from mat min assist for lateral  movement. PT assisted to place board to begin. 2nd person holding board to prevent slipping. PT stabilized at knees to prevent sliding forward and gave verbal cues to lean head away from direction he is going.      Therapeutic Activites    Therapeutic Activities Other Therapeutic Activities    ADL's From short sit transitioned to long sit with OT behind pt for safety and trying to cue to lean forward more while PT lifted legs on to mat. Long sit to supine holding to OT and PT arms to lower himself slowly. Rolling right x 3 using arms for momentum min assist. PT then placed draw sheet under pt and PT and OT utilized draw sheet to scoot pt to the middle of the table. Returned to long sit pulling on PT and OT arms to rise. In long sit: performed stretching for hamstrings leaning forward x 2 min. Then performed bilateral shoulder extension with holding cane behind back x 10 then with sliding cane up up back x 10. Tactile cues for scapular  activation. Propping on arms with PT and OT assisting with hand placement to try to get more shoulder extension and cue to push down through arms and depress scapula. Pt only able to hold briefly with trying to prop on arms needing min assist to maintain. Then performed weight shifting to one arm and reaching other arm towards ipsilateral foot x 3 each side then added in trunk rotation reaching to contralateral foot x 5. Returned to short sit with pt assisting to move feet with OT providing mod assist under feet and PT behind for safety. Scooting to edge of mat pulling on edge of mat and min assist to slide bottom forward.                  PT Education - 01/25/20 1347    Education Details PT discussed with pt about planned discharge after next 2 visits. To have 1 of son's try to come  to visit to review slideboard transfers with them and answer any questions as they are the ones that perform slideboard with him 2-3x/week.    Person(s) Educated Patient    Methods Explanation    Comprehension Verbalized understanding            PT Short Term Goals - 01/10/20 1158      PT SHORT TERM GOAL #1   Title = LTG             PT Long Term Goals - 01/10/20 1158      PT LONG TERM GOAL #1   Title Pt will be able to perform slideboard transfer min assist once board is placed for improved mobility.    Baseline Mod Assist +1 with second person for safety going to mat but no second person needed going back to chair.    Time 4    Period Weeks    Status Revised    Target Date 02/09/20      PT LONG TERM GOAL #2   Title Pt will transition from long sit to short sit min assist for improved mobility.    Baseline Max A for long-sit to short-sit 01/10/20    Time 4    Period Weeks    Status Revised    Target Date 02/09/20      PT LONG TERM GOAL #3   Title Patient will perform long sit/circle without UE support >/= 5 minutes with min guard for improved ability to assist with ADLs in bed.    Baseline ~3 minutes with min A at this time 01/10/20    Time 4    Period Weeks    Status Revised    Target Date 02/09/20      PT LONG TERM GOAL #4   Title Pt will perform rolling in bed mod assist for improved mobility.    Baseline Min A for rolling L and R 01/10/20    Status Achieved                 Plan - 01/25/20 1349    Clinical Impression Statement PT continued to focus on functional mobility with slideboard transfer and bed mobility. Incorporated functional UE strengthening in long sit position. Pt still has difficulty with propping in long sit. Does best if holding to legs to step foward more.    Personal Factors and Comorbidities Comorbidity 2    Comorbidities history of DVTs, DM2    Examination-Activity Limitations Bathing;Bed Mobility;Transfers;Dressing     Examination-Participation Restrictions Community Activity    Stability/Clinical  Decision Making Evolving/Moderate complexity    Rehab Potential Good    PT Frequency 1x / week   plus eval   PT Duration 4 weeks    PT Treatment/Interventions ADLs/Self Care Home Management;Cryotherapy;DME Instruction;Moist Heat;Functional mobility training;Therapeutic activities;Therapeutic exercise;Balance training;Patient/family education;Neuromuscular re-education;Manual techniques;Wheelchair mobility training;Passive range of motion    PT Next Visit Plan Update HEP as needed.  SCI-FIT. Slideboard transfers, sitting balance in short sit and long sit, bed mobility.    Consulted and Agree with Plan of Care Patient;Family member/caregiver    Family Member Consulted wife           Patient will benefit from skilled therapeutic intervention in order to improve the following deficits and impairments:  Decreased balance,Decreased mobility,Decreased knowledge of use of DME,Decreased activity tolerance,Decreased range of motion,Impaired flexibility,Decreased strength,Increased edema,Impaired sensation,Impaired tone,Impaired UE functional use  Visit Diagnosis: Muscle weakness (generalized)  Abnormal posture     Problem List Patient Active Problem List   Diagnosis Date Noted  . At risk for unstable body temperature 11/26/2019  . Wheelchair dependence 11/26/2019  . Pressure ulcer 09/22/2019  . Cellulitis 09/22/2019  . Hypokalemia 09/22/2019  . Spasticity 03/08/2019  . Erectile dysfunction due to diseases classified elsewhere 03/08/2019  . 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 Neurogenic bowel 01/22/2019  . Neurogenic bladder 01/22/2019  . Myofascial pain 01/22/2019  . 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 Quadriplegia (HCC) 12/30/2018    Ronn Melena, PT, DPT, NCS 01/25/2020, 1:51 PM  [facility] [address] [place], [place], [address] Phone: [phone]   Fax:  [phone]  Name: [name] MRN: [id number] Date of Birth: 1965/09/09

## 2020-01-25 NOTE — Therapy (Signed)
[facility] [address] [place], [place], [address] Phone: [phone]   Fax:  [phone]  Occupational Therapy Treatment  Patient Details  Name: [name] MRN: [id number] Date of Birth: 1965/04/11 Referring Provider (OT): Dr. Berline Chough   Encounter Date: 01/25/2020   OT End of Session - 01/25/20 1051    Visit Number 8    Number of Visits 13    Date for OT Re-Evaluation 03/09/19    Authorization Type Medicaid    Authorization Time Period 27 visits combined    Authorization - Visit Number 8    Authorization - Number of Visits 12    OT Start Time 1018    OT Stop Time 1100    OT Time Calculation (min) 42 min    Activity Tolerance Patient tolerated treatment well    Behavior During Therapy WFL for tasks assessed/performed           Past Medical History:  Diagnosis Date  . Diabetes mellitus without complication (HCC)   . History of cervical fracture     Past Surgical History:  Procedure Laterality Date  . CERVICAL SPINE SURGERY      There were no vitals filed for this visit.   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 DM    Patient Stated Goals get better    Currently in Pain? No/denies                                OTTreatment/ Education - 01/25/20 1044    Education Details Pt was educated in Putty HEP, (green right hand, red left hand), coordination HEP for bilateral UES(flipping and dealing cards, stacking coins    Person(s) Educated Patient;Spouse    Methods Explanation;Handout;Demonstration   handout for putty, verbally instructed in coordination   Comprehension Verbalized understanding;Verbal cues required;Returned demonstration             OT Short Term Goals - 01/25/20 1029      OT SHORT TERM GOAL #1   Title I with inital HEP-    Status Achieved   putty HEP     OT SHORT TERM GOAL #2   Title Pt will donn shirt with min A    Status On-going   mod A     OT SHORT TERM GOAL #3   Title Pt will report bilateral UE pain less than or equal to 4/10 during ADLS/ functional use.    Status Achieved      OT SHORT TERM GOAL #4   Title Pt will increase LUE grip strength to 17 lbs or greater for increased functional use.    Status On-going   LUE 11.6 lbs     OT SHORT TERM GOAL #5   Title Pt will demonstrate improved LUE fine motor coordination for ADLs as evidenced by decreasing 9 hole peg test score to 80 secs or less.    Status Achieved      OT SHORT TERM GOAL #6   Title Pt will perform sliding board transfers with max A  for increased I with ADLs/ toilet transfers prn    Status Achieved      OT SHORT TERM GOAL #7  Title Pt / family will verbalize understanding of pain reduction strategies for UE's.    Status On-going             OT Long Term Goals - 01/25/20 1057      OT LONG TERM GOAL #1   Title Pt will donn shirt with setup    Baseline mod A    Time 12    Period Weeks    Status On-going   mod A     OT LONG TERM GOAL #2   Title Pt will demonstrate improved bilateral UE external rotation to [facility]  as evidenced by pt's  increased ease with supporting himself in long and circle sitting, with pain less than or equal to 3/10.    Baseline limited bilateral external rotation in shoulders, pain 3-6/10 in UE's    Time 12    Period Weeks    Status On-going      OT LONG TERM GOAL #3   Title Pt will demonstrate improved LUE fine motor coordination as evidenced  by decreasing 9 hole peg test score to 70 secs or less.    Baseline RUE 34.22, LUE 1 min 31 secs    Time 12    Period Weeks    Status Achieved   47.28 last  visit     OT LONG TERM GOAL #4   Title Pt/ family witllverbalize understanding of adapted  strategies to maximize pt's I with ADLs/ IADLs.    Baseline needs updates/ reinforcement of strategies as pt is not perfroming consistently.    Time 12    Period Weeks    Status New      OT LONG TERM GOAL #5   Title Pt will increase RUE grip to 36 lbs and LUE grip to 20 lbs or greater in prep for bilateral functional use.    Baseline RUE 30.4, LUE 12.1    Time 12    Period Weeks    Status On-going      OT LONG TERM GOAL #6   Title Pt will perform sliding board transfers to assist with ADLS and in prep for toilet transfers with mod A to level surface.    Baseline total A +2    Time 12    Period Weeks    Status On-going                 Plan - 01/25/20 1100    Clinical Impression Statement Pt demonstrates overall progress towards goals with improved fine motor coordination in LUE and decreased shoulder pain.    OT Occupational Profile and History Detailed Assessment- Review of Records and additional review of physical, cognitive, psychosocial history related to current functional performance    Occupational performance deficits (Please refer to evaluation for details): ADL's;IADL's;Work;Leisure;Social Participation;Rest and Sleep;Play    Body Structure / Function / Physical Skills ADL;UE functional use;Endurance;Balance;Flexibility;Pain;FMC;ROM;GMC;Coordination;Sensation;Decreased knowledge of precautions;Decreased knowledge of use of DME;IADL;Strength;Dexterity;Mobility;Tone    Rehab Potential Good    Clinical Decision Making Limited treatment options, no task modification necessary    Comorbidities Affecting Occupational Performance: May have comorbidities impacting occupational performance    Modification or Assistance to Complete Evaluation  No modification of tasks or assist necessary to complete eval    OT Frequency 1x / week   plus eval   OT Duration 12 weeks    OT Treatment/Interventions Self-care/ADL training;Ultrasound;Energy conservation;Aquatic Therapy;DME and/or AE  instruction;Patient/family education;Paraffin;Gait Training;Passive range of motion;Balance training;Fluidtherapy;Cryotherapy;Splinting;Building services engineer;Contrast Bath;Electrical Stimulation;Moist Heat;Therapeutic exercise;Manual Therapy;Therapeutic activities;Cognitive remediation/compensation;Neuromuscular  education    Plan Issue HEP for UE strengthening, consider theraband for shoulder abduction, rowing, extension in w/c, practice donning/ doffing shirt,  work towards remaing goals, anticipate d/c at end of December    Consulted and Agree with Plan of Care Patient;Family member/caregiver    Family Member Consulted wife,interpreter           Patient will benefit from skilled therapeutic intervention in order to improve the following deficits and impairments:   Body Structure / Function / Physical Skills: ADL,UE functional use,Endurance,Balance,Flexibility,Pain,FMC,ROM,GMC,Coordination,Sensation,Decreased knowledge of precautions,Decreased knowledge of use of DME,IADL,Strength,Dexterity,Mobility,Tone       Visit Diagnosis: Stiffness of right shoulder, not elsewhere classified  Muscle weakness (generalized)  Stiffness of left shoulder, not elsewhere classified  Other lack of coordination  Other symptoms and signs involving the nervous system  Other disturbances of skin sensation    Problem List Patient Active Problem List   Diagnosis Date Noted  . At risk for unstable body temperature 11/26/2019  . Wheelchair dependence 11/26/2019  . Pressure ulcer 09/22/2019  . Cellulitis 09/22/2019  . Hypokalemia 09/22/2019  . Spasticity 03/08/2019  . Erectile dysfunction due to diseases classified elsewhere 03/08/2019  . 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 Neurogenic bowel 01/22/2019  . Neurogenic bladder 01/22/2019  . Myofascial pain 01/22/2019  . 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 Quadriplegia (HCC) 12/30/2018    Travian Kerner 01/25/2020, 12:06  PM  [facility] [address] [place], [place], [address] Phone: [phone]   Fax:  [phone]  Name: [name] MRN: [id number] Date of Birth: 1965-10-08

## 2020-01-25 NOTE — Patient Instructions (Signed)
Red putty with left hand, green putty with right hand   Pinch: Palmar    Pellizque la masilla con el pulgar derecho y la punta de cada uno de los dedos. Repita _20__ veces. Haga __1__ sesiones por da. Actividad: Pele frutas como naranjas o limones.* Despegue calcomanas de una superficie.  Copyright  VHI. All rights reserved.  Putty-in-Your-Hand    Apriete lentamente una masilla o pelota de goma blanda mientras respira normalmente. Repita con la otra mano. Repita la serie __10-20__ veces. Haga __1__ sesiones por da.  [email or web address].[email or web address]   Copyright  VHI. All rights reserved.

## 2020-01-27 ENCOUNTER — Other Ambulatory Visit: Payer: Self-pay | Admitting: Physical Medicine and Rehabilitation

## 2020-01-27 NOTE — Progress Notes (Signed)
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 collagen to wound bed as instructed Secondary Dressing: Woven Gauze Sponge, Non-Sterile 4x4 in Other:weekly/7 Days Discharge Instructions: Apply over primary dressing as directed. Com pression Wrap: FourPress (4 layer compression wrap) Other:weekly/7 Days Discharge Instructions: Apply four layer compression as directed. #1 we are still using silver collagen on the 1 remaining wound on the left fifth metatarsal head plantar aspect. This is smaller. Debrided today hopefully will allow full epithelialization Electronic Signature(s) Signed: 01/27/2020 8:09:40 AM By: Linton Ham MD Entered By: Linton Ham on 01/21/2020 14:13:11 -------------------------------------------------------------------------------- SuperBill Details Patient Name: Date of Service: [name], [name] 01/21/2020 Medical Record Number: [id number] Patient Account Number: [id number] Date of  Birth/Sex: Treating RN: [birth year] (54 y.o. 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 mellitus with foot ulcer L97.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 G82.21 Paraplegia, complete Facility Procedures CPT4 Code: [id number] Description: 25956 - DEB SUBQ TISSUE 20 SQ CM/< ICD-10 Diagnosis Description L97.528 Non-pressure chronic ulcer of other part of left foot with other specified seve Modifier: rity Quantity: 1 Physician Procedures : CPT4 Code Description Modifier [id number] - WC PHYS SUBQ TISS 20 SQ CM ICD-10 Diagnosis Description L97.528 Non-pressure chronic ulcer of other part of left foot with other specified severity Quantity: 1 Electronic Signature(s) Signed: 01/27/2020 8:09:40 AM By: Linton Ham MD Entered By: Linton Ham on 01/21/2020 14:13:27

## 2020-01-27 NOTE — Progress Notes (Signed)
[name], [name] ([id number]) Visit Report for 01/21/2020 Arrival Information Details Patient Name: Date of Service: FA CUNDO, [name] 01/21/2020 1:15 PM Medical Record Number: [id number] Patient Account Number: [id number] Date of Birth/Sex: Treating RN: 12/04/1965 (54 y.o. M) Boehlein, Linda Primary Care Provider: Edwards, Michelle Other Clinician: Referring Provider: Treating Provider/Extender: Robson, Michael Edwards, Michelle Weeks in Treatment: 16 Visit Information History Since Last Visit Added or deleted any medications: No Patient Arrived: Wheel Chair Any new allergies or adverse reactions: No Arrival Time: 13:21 Had a fall or experienced change in No Accompanied By: wife activities of daily living that may affect Transfer Assistance: None risk of falls: Patient Identification Verified: Yes Signs or symptoms of abuse/neglect since last visito No Secondary Verification Process Completed: Yes Hospitalized since last visit: No Patient Requires Transmission-Based Precautions: No Implantable device outside of the clinic excluding No Patient Has Alerts: Yes cellular tissue based products placed in the center Patient Alerts: Translator Required since last visit: right ABI 1.61 Has Dressing in Place as Prescribed: Yes Left ABI 1.36 Pain Present Now: No Electronic Signature(s) Signed: 01/21/2020 3:26:01 PM By: Dawkins, Destiny Entered By: Dawkins, Destiny on 01/21/2020 13:22:23 -------------------------------------------------------------------------------- Compression Therapy Details Patient Name: Date of Service: FA CUNDO, [name] 01/21/2020 1:15 PM Medical Record Number: [id number] Patient Account Number: [id number] Date of Birth/Sex: Treating RN: 09/30/1965 (54 y.o. M) Boehlein, Linda Primary Care Provider: Edwards, Michelle Other Clinician: Referring Provider: Treating Provider/Extender: Robson, Michael Edwards, Michelle Weeks in Treatment: 16 Compression Therapy  Performed for Wound Assessment: Wound #3 Left Metatarsal head fifth Performed By: Clinician Lynch, Shatara, RN Compression Type: Four Layer Post Procedure Diagnosis Same as Pre-procedure Electronic Signature(s) Signed: 01/21/2020 5:15:15 PM By: Boehlein, Linda RN, BSN Entered By: Boehlein, Linda on 01/21/2020 14:04:45 -------------------------------------------------------------------------------- Encounter Discharge Information Details Patient Name: Date of Service: FA CUNDO, [name] 01/21/2020 1:15 PM Medical Record Number: [id number] Patient Account Number: [id number] Date of Birth/Sex: Treating RN: 08/24/1965 (54 y.o. M) Lynch, Shatara Primary Care Provider: Edwards, Michelle Other Clinician: Referring Provider: Treating Provider/Extender: Robson, Michael Edwards, Michelle Weeks in Treatment: 16 Encounter Discharge Information Items Post Procedure Vitals Discharge Condition: Stable Temperature (F): 98.2 Ambulatory Status: Wheelchair Pulse (bpm): 60 Discharge Destination: Home Respiratory Rate (breaths/min): 20 Transportation: Private Auto Blood Pressure (mmHg): 114/73 Accompanied By: wife Schedule Follow-up Appointment: Yes Clinical Summary of Care: Patient Declined Electronic Signature(s) Signed: 01/25/2020 5:24:10 PM By: Lynch, Shatara RN, BSN Entered By: Lynch, Shatara on 01/21/2020 14:40:00 -------------------------------------------------------------------------------- Lower Extremity Assessment Details Patient Name: Date of Service: FA CUNDO, [name] 01/21/2020 1:15 PM Medical Record Number: [id number] Patient Account Number: [id number] Date of Birth/Sex: Treating RN: 01/17/1966 (54 y.o. M) Deaton, Bobbi Primary Care Provider: Edwards, Michelle Other Clinician: Referring Provider: Treating Provider/Extender: Robson, Michael Edwards, Michelle Weeks in Treatment: 16 Edema Assessment Assessed: [Left: Yes] [Right: No] Edema: [Left: Yes] [Right:  Yes] Calf Left: Right: Point of Measurement: From Medial Instep 38 cm Ankle Left: Right: Point of Measurement: From Medial Instep 23 cm Vascular Assessment Pulses: Dorsalis Pedis Palpable: [Left:Yes] Electronic Signature(s) Signed: 01/21/2020 2:12:11 PM By: Deaton, Bobbi Entered By: Deaton, Bobbi on 01/21/2020 13:37:39 -------------------------------------------------------------------------------- Multi Wound Chart Details Patient Name: Date of Service: FA CUNDO, [name] 01/21/2020 1:15 PM Medical Record Number: [id number] Patient Account Number: [id number] Date of Birth/Sex: Treating RN: 07/19/1965 (54 y.o. M) Boehlein, Linda Primary Care Provider: Edwards, Michelle Other Clinician: Referring Provider: Treating Provider/Extender: Robson, Michael Edwards, Michelle Weeks in Treatment: 16 Vital Signs Height(in): 68 Pulse(bpm): 60 Weight(lbs): 235 Blood   Pressure(mmHg): 114/73 Body Mass Index(BMI): 36 Temperature(F): 98.2 Respiratory Rate(breaths/min): 20 Photos: [3:No Photos Left Metatarsal head fifth] [4:No Photos Left, Lateral Lower Leg] [N/A:N/A N/A] Wound Location: [3:Gradually Appeared] [4:Pressure Injury] [N/A:N/A] Wounding Event: [3:Pressure Ulcer] [4:Pressure Ulcer] [N/A:N/A] Primary Etiology: [3:Hypotension, Type II Diabetes] [4:N/A] [N/A:N/A] Comorbid History: [3:08/12/2019] [4:08/12/2019] [N/A:N/A] Date Acquired: [3:16] [4:16] [N/A:N/A] Weeks of Treatment: [3:Open] [4:Open] [N/A:N/A] Wound Status: [3:0.6x0.5x0.1] [4:0x0x0] [N/A:N/A] Measurements L x W x D (cm) [3:0.236] [4:0] [N/A:N/A] A (cm) : rea [3:0.024] [4:0] [N/A:N/A] Volume (cm) : [3:95.80%] [4:100.00%] [N/A:N/A] % Reduction in A rea: [3:95.80%] [4:100.00%] [N/A:N/A] % Reduction in Volume: [3:Category/Stage IV] [4:Category/Stage III] [N/A:N/A] Classification: [3:Medium] [4:N/A] [N/A:N/A] Exudate A mount: [3:Serosanguineous] [4:N/A] [N/A:N/A] Exudate Type: [3:red, brown] [4:N/A] [N/A:N/A] Exudate  Color: [3:Flat and Intact] [4:N/A] [N/A:N/A] Wound Margin: [3:Large (67-100%)] [4:N/A] [N/A:N/A] Granulation A mount: [3:Red, Pink] [4:N/A] [N/A:N/A] Granulation Quality: [3:None Present (0%)] [4:N/A] [N/A:N/A] Necrotic A mount: [3:Fat Layer (Subcutaneous Tissue): Yes N/A] [N/A:N/A] Exposed Structures: [3:Fascia: No Tendon: No Muscle: No Joint: No Bone: No Large (67-100%)] [4:N/A] [N/A:N/A] Epithelialization: [3:Debridement - Excisional] [4:N/A] [N/A:N/A] Debridement: Pre-procedure Verification/Time Out 14:00 [4:N/A] [N/A:N/A] Taken: [3:Callus, Subcutaneous, Slough] [4:N/A] [N/A:N/A] Tissue Debrided: [3:Skin/Subcutaneous Tissue] [4:N/A] [N/A:N/A] Level: [3:0.3] [4:N/A] [N/A:N/A] Debridement A (sq cm): [3:rea Curette] [4:N/A] [N/A:N/A] Instrument: [3:Minimum] [4:N/A] [N/A:N/A] Bleeding: [3:Pressure] [4:N/A] [N/A:N/A] Hemostasis A chieved: [3:Insensate] [4:N/A] [N/A:N/A] Procedural Pain: [3:Insensate] [4:N/A] [N/A:N/A] Post Procedural Pain: [3:Procedure was tolerated well] [4:N/A] [N/A:N/A] Debridement Treatment Response: [3:0.6x0.5x0.1] [4:N/A] [N/A:N/A] Post Debridement Measurements L x W x D (cm) [3:0.024] [4:N/A] [N/A:N/A] Post Debridement Volume: (cm) [3:Category/Stage IV] [4:N/A] [N/A:N/A] Post Debridement Stage: [3:Compression Therapy] [4:N/A] [N/A:N/A] Procedures Performed: [3:Debridement] Treatment Notes Electronic Signature(s) Signed: 01/21/2020 5:15:15 PM By: Baruch Gouty RN, BSN Signed: 01/27/2020 8:09:40 AM By: Linton Ham MD Entered By: Linton Ham on 01/21/2020 14:09:44 -------------------------------------------------------------------------------- Multi-Disciplinary Care Plan Details Patient Name: Date of Service: Corwin Levins, [name] 01/21/2020 1:15 PM Medical Record Number: [id number] Patient Account Number: [id number] Date of Birth/Sex: Treating RN: 12-04-1965 (54 y.o. [name] Primary Care Sina Lucchesi: Juluis Mire Other  Clinician: Referring Lannah Koike: Treating Carzell Saldivar/Extender: Fleet Contras in Treatment: 16 Active Inactive Nutrition Nursing Diagnoses: Impaired glucose control: actual or potential Goals: Patient/caregiver verbalizes understanding of need to maintain therapeutic glucose control per primary care physician Date Initiated: 10/01/2019 Target Resolution Date: 02/11/2020 Goal Status: Active Interventions: Provide education on elevated blood sugars and impact on wound healing Notes: Wound/Skin Impairment Nursing Diagnoses: Impaired tissue integrity Goals: Ulcer/skin breakdown will have a volume reduction of 50% by week 8 Date Initiated: 10/01/2019 Date Inactivated: 12/03/2019 Target Resolution Date: 12/03/2019 Unmet Reason: paraplegia and Goal Status: Unmet offloading management Ulcer/skin breakdown will have a volume reduction of 80% by week 12 Date Initiated: 12/03/2019 Date Inactivated: 01/14/2020 Target Resolution Date: 12/31/2019 Goal Status: Met Ulcer/skin breakdown will heal within 14 weeks Date Initiated: 01/14/2020 Target Resolution Date: 03/03/2020 Goal Status: Active Interventions: Provide education on ulcer and skin care Notes: Electronic Signature(s) Signed: 01/21/2020 5:15:15 PM By: Baruch Gouty RN, BSN Entered By: Baruch Gouty on 01/21/2020 13:56:51 -------------------------------------------------------------------------------- Pain Assessment Details Patient Name: Date of Service: Corwin Levins, [name] 01/21/2020 1:15 PM Medical Record Number: [id number] Patient Account Number: [id number] Date of Birth/Sex: Treating RN: [birth year] (54 y.o. [name] Primary Care Sky Borboa: Juluis Mire Other Clinician: Referring Aries Kasa: Treating Jannine Abreu/Extender: Fleet Contras in Treatment: 16 Active Problems Location of Pain Severity and Description of Pain Patient Has Paino No Site Locations Pain  Management and Medication Current Pain  Management: Electronic Signature(s) Signed: 01/21/2020 3:26:01 PM By: Dawkins, Destiny Signed: 01/21/2020 5:15:15 PM By: Boehlein, Linda RN, BSN Entered By: Dawkins, Destiny on 01/21/2020 13:22:44 -------------------------------------------------------------------------------- Patient/Caregiver Education Details Patient Name: Date of Service: FA CUNDO, [name] 12/10/2021andnbsp1:15 PM Medical Record Number: [id number] Patient Account Number: [id number] Date of Birth/Gender: Treating RN: 11/03/1965 (54 y.o. M) Boehlein, Linda Primary Care Physician: Edwards, Michelle Other Clinician: Referring Physician: Treating Physician/Extender: Robson, Michael Edwards, Michelle Weeks in Treatment: 16 Education Assessment Education Provided To: Patient Education Topics Provided Pressure: Methods: Explain/Verbal Responses: Reinforcements needed, State content correctly Venous: Methods: Explain/Verbal Responses: Reinforcements needed, State content correctly Wound/Skin Impairment: Methods: Explain/Verbal Responses: Reinforcements needed, State content correctly Electronic Signature(s) Signed: 01/21/2020 5:15:15 PM By: Boehlein, Linda RN, BSN Entered By: Boehlein, Linda on 01/21/2020 13:57:21 -------------------------------------------------------------------------------- Wound Assessment Details Patient Name: Date of Service: FA CUNDO, [name] 01/21/2020 1:15 PM Medical Record Number: [id number] Patient Account Number: [id number] Date of Birth/Sex: Treating RN: 08/06/1965 (54 y.o. M) Boehlein, Linda Primary Care Provider: Edwards, Michelle Other Clinician: Referring Provider: Treating Provider/Extender: Robson, Michael Edwards, Michelle Weeks in Treatment: 16 Wound Status Wound Number: 3 Primary Etiology: Pressure Ulcer Wound Location: Left Metatarsal head fifth Wound Status: Open Wounding Event: Gradually Appeared Comorbid History:  Hypotension, Type II Diabetes Date Acquired: 08/12/2019 Weeks Of Treatment: 16 Clustered Wound: No Photos Photo Uploaded By: Jones, Dedrick on 01/26/2020 14:19:23 Wound Measurements Length: (cm) 0.6 Width: (cm) 0.5 Depth: (cm) 0.1 Area: (cm) 0.236 Volume: (cm) 0.024 % Reduction in Area: 95.8% % Reduction in Volume: 95.8% Epithelialization: Large (67-100%) Tunneling: No Undermining: No Wound Description Classification: Category/Stage IV Wound Margin: Flat and Intact Exudate Amount: Medium Exudate Type: Serosanguineous Exudate Color: red, brown Foul Odor After Cleansing: No Slough/Fibrino No Wound Bed Granulation Amount: Large (67-100%) Exposed Structure Granulation Quality: Red, Pink Fascia Exposed: No Necrotic Amount: None Present (0%) Fat Layer (Subcutaneous Tissue) Exposed: Yes Tendon Exposed: No Muscle Exposed: No Joint Exposed: No Bone Exposed: No Treatment Notes Wound #3 (Metatarsal head fifth) Wound Laterality: Left Cleanser Peri-Wound Care Sween Lotion (Moisturizing lotion) Discharge Instruction: Apply moisturizing lotion as directed Topical Primary Dressing Promogran Prisma Matrix, 4.34 (sq in) (silver collagen) Discharge Instruction: Apply silver collagen to wound bed as instructed Secondary Dressing Woven Gauze Sponge, Non-Sterile 4x4 in Discharge Instruction: Apply over primary dressing as directed. Secured With Compression Wrap FourPress (4 layer compression wrap) Discharge Instruction: Apply four layer compression as directed. Compression Stockings Add-Ons Electronic Signature(s) Signed: 01/21/2020 2:12:11 PM By: Deaton, Bobbi Signed: 01/21/2020 5:15:15 PM By: Boehlein, Linda RN, BSN Entered By: Deaton, Bobbi on 01/21/2020 13:38:13 -------------------------------------------------------------------------------- Wound Assessment Details Patient Name: Date of Service: FA CUNDO, [name] 01/21/2020 1:15 PM Medical Record Number:  [id number] Patient Account Number: [id number] Date of Birth/Sex: Treating RN: 11/06/1965 (54 y.o. M) Boehlein, Linda Primary Care Provider: Edwards, Michelle Other Clinician: Referring Provider: Treating Provider/Extender: Robson, Michael Edwards, Michelle Weeks in Treatment: 16 Wound Status Wound Number: 4 Primary Etiology: Pressure Ulcer Wound Location: Left, Lateral Lower Leg Wound Status: Open Wounding Event: Pressure Injury Date Acquired: 08/12/2019 Weeks Of Treatment: 16 Clustered Wound: No Photos Photo Uploaded By: Jones, Dedrick on 01/26/2020 14:19:24 Wound Measurements Length: (cm) Width: (cm) Depth: (cm) Area: (cm) Volume: (cm) 0 % Reduction in Area: 100% 0 % Reduction in Volume: 100% 0 0 0 Wound Description Classification: Category/Stage III Electronic Signature(s) Signed: 01/21/2020 3:26:01 PM By: Dawkins, Destiny Signed: 01/21/2020 5:15:15 PM By: Boehlein, Linda RN, BSN Entered By: Dawkins, Destiny on 01/21/2020 13:33:02 -------------------------------------------------------------------------------- Vitals Details   Patient Name: Date of Service: [name] 01/21/2020 1:15 PM Medical Record Number: [id number] Patient Account Number: [id number] Date of Birth/Sex: Treating RN: September 07, 1965 (54 y.o. [name] Primary Care Izabella Marcantel: Juluis Mire Other Clinician: Referring Trysta Showman: Treating Amarionna Arca/Extender: Fleet Contras in Treatment: 16 Vital Signs Time Taken: 13:22 Temperature (F): 98.2 Height (in): 68 Pulse (bpm): 60 Weight (lbs): 235 Respiratory Rate (breaths/min): 20 Body Mass Index (BMI): 35.7 Blood Pressure (mmHg): 114/73 Reference Range: 80 - 120 mg / dl Electronic Signature(s) Signed: 01/21/2020 3:26:01 PM By: Sandre Kitty Entered By: Sandre Kitty on 01/21/2020 13:22:38

## 2020-01-28 ENCOUNTER — Other Ambulatory Visit: Payer: Self-pay

## 2020-01-28 ENCOUNTER — Encounter (HOSPITAL_BASED_OUTPATIENT_CLINIC_OR_DEPARTMENT_OTHER): Payer: Medicaid Other | Admitting: Internal Medicine

## 2020-01-28 DIAGNOSIS — E11621 Type 2 diabetes mellitus with foot ulcer: Secondary | ICD-10-CM | POA: Diagnosis not present

## 2020-01-28 NOTE — Progress Notes (Signed)
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 minutes daily and/or when sitting, a frequency of: - throughout the day Patient to wear own compression stockings every day. - right leg. apply in morning and remove at bedtime Off-Loading: Multipodus Splint to: - sage boots both feet daily Turn and reposition every 2 hours WOUND #3: - Metatarsal head fifth Wound Laterality: Left Peri-Wound Care: Sween Lotion (Moisturizing lotion) Other:weekly/7 Days Discharge Instructions: Apply moisturizing lotion as directed Prim Dressing: Hydrofera Blue Classic Foam, 2x2 in Other:weekly/7 Days ary Discharge Instructions: Moisten with saline prior to applying to wound bed Secondary Dressing: Woven Gauze Sponge, Non-Sterile 4x4 in Other:weekly/7 Days Discharge Instructions: Apply over primary dressing as directed. Com pression Wrap: FourPress (4 layer compression wrap) Other:weekly/7 Days Discharge Instructions: Apply four layer compression as directed. 1.  Change the dressing to Hydrofera Blue. I am hoping to get this area to epithelialize. It is mirror-image wound on the right was initially larger and is already closed 2. Keeping him in 3 layer compression Electronic Signature(s) Signed: 01/28/2020 4:58:57 PM By: Harry Ham MD Entered By: [name] on 01/28/2020 16:09:37 -------------------------------------------------------------------------------- SuperBill Details Patient Name: Date of Service: [name], [place] S 01/28/2020 Medical Record Number: [id number] Patient Account Number: [id number] Date of Birth/Sex: Treating RN: [birth year] (54 y.o. 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 G82.21 Paraplegia, complete Facility Procedures CPT4 Code: [id number] Description: 28315 - DEBRIDE WOUND 1ST 20 SQ CM OR <  ICD-10 Diagnosis Description L97.528 Non-pressure chronic ulcer of other part of left foot with other specified severity Modifier: Quantity: 1 Physician Procedures : CPT4 Code Description Modifier [id number] - WC PHYS DEBR WO ANESTH 20 SQ CM ICD-10 Diagnosis Description L97.528 Non-pressure chronic ulcer of other part of left foot with other specified severity Quantity: 1 Electronic Signature(s) Signed: 01/28/2020 4:58:57 PM By: Harry Ham MD Entered By: [name] on 01/28/2020 16:09:54

## 2020-02-01 ENCOUNTER — Ambulatory Visit: Payer: Medicaid Other | Admitting: Physical Therapy

## 2020-02-01 ENCOUNTER — Other Ambulatory Visit: Payer: Self-pay

## 2020-02-01 ENCOUNTER — Ambulatory Visit: Payer: Medicaid Other | Admitting: Occupational Therapy

## 2020-02-01 DIAGNOSIS — M6281 Muscle weakness (generalized): Secondary | ICD-10-CM

## 2020-02-01 DIAGNOSIS — R208 Other disturbances of skin sensation: Secondary | ICD-10-CM

## 2020-02-01 DIAGNOSIS — M25612 Stiffness of left shoulder, not elsewhere classified: Secondary | ICD-10-CM

## 2020-02-01 DIAGNOSIS — G8252 Quadriplegia, C1-C4 incomplete: Secondary | ICD-10-CM

## 2020-02-01 DIAGNOSIS — M25512 Pain in left shoulder: Secondary | ICD-10-CM

## 2020-02-01 DIAGNOSIS — M25611 Stiffness of right shoulder, not elsewhere classified: Secondary | ICD-10-CM

## 2020-02-01 DIAGNOSIS — M25511 Pain in right shoulder: Secondary | ICD-10-CM

## 2020-02-01 DIAGNOSIS — R29818 Other symptoms and signs involving the nervous system: Secondary | ICD-10-CM | POA: Diagnosis not present

## 2020-02-01 DIAGNOSIS — R293 Abnormal posture: Secondary | ICD-10-CM

## 2020-02-01 NOTE — Progress Notes (Signed)
[name], Harry ([id number]) Visit Report for 01/28/2020 Arrival Information Details Patient Name: Date of Service: [name], [place] 01/28/2020 2:00 PM Medical Record Number: [id number] Patient Account Number: [id number] Date of Birth/Sex: Treating RN: 08/03/1965 (54 y.o. [name] Primary Care Valmai Vandenberghe: Juluis Mire Other Clinician: Referring [place] Kappes: Treating Mister Krahenbuhl/Extender: Fleet Contras in Treatment: 9 Visit Information History Since Last Visit Added or deleted any medications: No Patient Arrived: Wheel Chair Any new allergies or adverse reactions: No Arrival Time: 14:13 Had a fall or experienced change in No Accompanied By: wife activities of daily living that may affect Transfer Assistance: None risk of falls: Patient Identification Verified: Yes Signs or symptoms of abuse/neglect since last visito No Secondary Verification Process Completed: Yes Hospitalized since last visit: No Patient Requires Transmission-Based Precautions: No Implantable device outside of the clinic excluding No Patient Has Alerts: Yes cellular tissue based products placed in the center Patient Alerts: Translator Required since last visit: right ABI 1.61 Has Dressing in Place as Prescribed: Yes Left ABI 1.36 Pain Present Now: No Electronic Signature(s) Signed: 02/01/2020 2:20:57 PM By: Sandre Kitty Entered By: Sandre Kitty on 01/28/2020 14:13:45 -------------------------------------------------------------------------------- Compression Therapy Details Patient Name: Date of Service: [name], [name] 01/28/2020 2:00 PM Medical Record Number: [id number] Patient Account Number: [id number] Date of Birth/Sex: Treating RN: 10/03/1965 (54 y.o. [name] Primary Care [name]: Juluis Mire Other Clinician: Referring Orlean Holtrop: Treating Mccartney Chuba/Extender: Fleet Contras in Treatment: 17 Compression Therapy  Performed for Wound Assessment: Wound #3 Left Metatarsal head fifth Performed By: Clinician Levan Hurst, RN Compression Type: Four Layer Post Procedure Diagnosis Same as Pre-procedure Electronic Signature(s) Signed: 01/28/2020 5:46:53 PM By: Levan Hurst RN, BSN Entered By: Levan Hurst on 01/28/2020 14:54:03 -------------------------------------------------------------------------------- Encounter Discharge Information Details Patient Name: Date of Service: [name], [name] 01/28/2020 2:00 PM Medical Record Number: [id number] Patient Account Number: [id number] Date of Birth/Sex: Treating RN: Dec 26, 1965 (54 y.o. [name] Primary Care Bence Trapp: Juluis Mire Other Clinician: Referring Montie Swiderski: Treating Dyshaun Bonzo/Extender: Fleet Contras in Treatment: 17 Encounter Discharge Information Items Post Procedure Vitals Discharge Condition: Stable Temperature (F): 97.6 Ambulatory Status: Wheelchair Pulse (bpm): 56 Discharge Destination: Home Respiratory Rate (breaths/min): 20 Transportation: Private Auto Blood Pressure (mmHg): 112/74 Accompanied By: wife Schedule Follow-up Appointment: Yes Clinical Summary of Care: Electronic Signature(s) Signed: 01/28/2020 5:32:58 PM By: Deon Pilling Entered By: Deon Pilling on 01/28/2020 16:34:50 -------------------------------------------------------------------------------- Lower Extremity Assessment Details Patient Name: Date of Service: [name] 01/28/2020 2:00 PM Medical Record Number: [id number] Patient Account Number: [id number] Date of Birth/Sex: Treating RN: [birth year] (54 y.o. Burnadette Pop, [name] Primary Care Dartanyan Deasis: Juluis Mire Other Clinician: Referring Milayna Rotenberg: Treating Council Munguia/Extender: Fleet Contras in Treatment: 17 Edema Assessment Assessed: Shirlyn Goltz: Yes] Patrice Paradise: No] Edema: [Left: Yes] [Right: Yes] Calf Left: Right: Point of  Measurement: From Medial Instep 38 cm Ankle Left: Right: Point of Measurement: From Medial Instep 23 cm Vascular Assessment Pulses: Dorsalis Pedis Palpable: [Left:Yes] Posterior Tibial Palpable: [Left:Yes] Electronic Signature(s) Signed: 01/28/2020 5:28:37 PM By: Rhae Hammock RN Entered By: Rhae Hammock on 01/28/2020 14:28:32 -------------------------------------------------------------------------------- Multi Wound Chart Details Patient Name: Date of Service: [name], [name] 01/28/2020 2:00 PM Medical Record Number: [id number] Patient Account Number: [id number] Date of Birth/Sex: Treating RN: [birth year] (54 y.o. [name] Primary Care Wana Mount: Juluis Mire Other Clinician: Referring Misti Towle: Treating Tyara Dassow/Extender: Fleet Contras in Treatment: 17 Vital Signs Height(in): 68 Pulse(bpm): 56 Weight(lbs): 537  Blood Pressure(mmHg): 112/74 Body Mass Index(BMI): 36 Temperature(F): 97.6 Respiratory Rate(breaths/min): 20 Photos: [3:No Photos Left Metatarsal head fifth] [N/A:N/A N/A] Wound Location: [3:Gradually Appeared] [N/A:N/A] Wounding Event: [3:Pressure Ulcer] [N/A:N/A] Primary Etiology: [3:Hypotension, Type II Diabetes] [N/A:N/A] Comorbid History: [3:08/12/2019] [N/A:N/A] Date Acquired: [3:17] [N/A:N/A] Weeks of Treatment: [3:Open] [N/A:N/A] Wound Status: [3:1x0.5x0.1] [N/A:N/A] Measurements L x W x D (cm) [3:0.393] [N/A:N/A] A (cm) : rea [3:0.039] [N/A:N/A] Volume (cm) : [3:93.10%] [N/A:N/A] % Reduction in A rea: [3:93.10%] [N/A:N/A] % Reduction in Volume: [3:Category/Stage IV] [N/A:N/A] Classification: [3:Medium] [N/A:N/A] Exudate A mount: [3:Sanguinous] [N/A:N/A] Exudate Type: [3:red] [N/A:N/A] Exudate Color: [3:Flat and Intact] [N/A:N/A] Wound Margin: [3:Large (67-100%)] [N/A:N/A] Granulation A mount: [3:Red, Pink] [N/A:N/A] Granulation Quality: [3:None Present (0%)] [N/A:N/A] Necrotic A  mount: [3:Fat Layer (Subcutaneous Tissue): Yes N/A] Exposed Structures: [3:Fascia: No Tendon: No Muscle: No Joint: No Bone: No Medium (34-66%)] [N/A:N/A] Epithelialization: [3:Debridement - Selective/Open Wound N/A] Debridement: Pre-procedure Verification/Time Out 14:48 [N/A:N/A] Taken: [3:Skin/Epidermis] [N/A:N/A] Level: [3:0.5] [N/A:N/A] Debridement A (sq cm): [3:rea Blade, Forceps] [N/A:N/A] Instrument: [3:Moderate] [N/A:N/A] Bleeding: [3:Pressure] [N/A:N/A] Hemostasis Achieved: [3:0] [N/A:N/A] Procedural Pain: [3:0] [N/A:N/A] Post Procedural Pain: [3:Procedure was tolerated well] [N/A:N/A] Debridement Treatment Response: [3:1x0.5x0.1] [N/A:N/A] Post Debridement Measurements L x W x D (cm) [3:0.039] [N/A:N/A] Post Debridement Volume: (cm) [3:Category/Stage IV] [N/A:N/A] Post Debridement Stage: [3:Compression Therapy] [N/A:N/A] Procedures Performed: [3:Debridement] Treatment Notes Electronic Signature(s) Signed: 01/28/2020 4:58:57 PM By: Linton Ham MD Signed: 01/28/2020 5:46:53 PM By: Levan Hurst RN, BSN Entered By: Linton Ham on 01/28/2020 16:06:31 -------------------------------------------------------------------------------- Multi-Disciplinary Care Plan Details Patient Name: Date of Service: [name], [name] 01/28/2020 2:00 PM Medical Record Number: [id number] Patient Account Number: [id number] Date of Birth/Sex: Treating RN: 15-Oct-1965 (54 y.o. [name] Primary Care Elliot Simoneaux: Juluis Mire Other Clinician: Referring Shailee Foots: Treating Emilo Gras/Extender: Fleet Contras in Treatment: 17 Active Inactive Nutrition Nursing Diagnoses: Impaired glucose control: actual or potential Goals: Patient/caregiver verbalizes understanding of need to maintain therapeutic glucose control per primary care physician Date Initiated: 10/01/2019 Target Resolution Date: 02/11/2020 Goal Status: Active Interventions: Provide  education on elevated blood sugars and impact on wound healing Notes: Wound/Skin Impairment Nursing Diagnoses: Impaired tissue integrity Goals: Ulcer/skin breakdown will have a volume reduction of 50% by week 8 Date Initiated: 10/01/2019 Date Inactivated: 12/03/2019 Target Resolution Date: 12/03/2019 Unmet Reason: paraplegia and Goal Status: Unmet offloading management Ulcer/skin breakdown will have a volume reduction of 80% by week 12 Date Initiated: 12/03/2019 Date Inactivated: 01/14/2020 Target Resolution Date: 12/31/2019 Goal Status: Met Ulcer/skin breakdown will heal within 14 weeks Date Initiated: 01/14/2020 Target Resolution Date: 03/03/2020 Goal Status: Active Interventions: Provide education on ulcer and skin care Notes: Electronic Signature(s) Signed: 01/28/2020 5:46:53 PM By: Levan Hurst RN, BSN Entered By: Levan Hurst on 01/28/2020 17:43:43 -------------------------------------------------------------------------------- Pain Assessment Details Patient Name: Date of Service: [name], [name] 01/28/2020 2:00 PM Medical Record Number: [id number] Patient Account Number: [id number] Date of Birth/Sex: Treating RN: June 03, 1965 (54 y.o. [name] Primary Care Zoye Chandra: Juluis Mire Other Clinician: Referring Drae Mitzel: Treating Sheyla Zaffino/Extender: Fleet Contras in Treatment: 17 Active Problems Location of Pain Severity and Description of Pain Patient Has Paino No Site Locations Pain Management and Medication Current Pain Management: Electronic Signature(s) Signed: 01/28/2020 5:46:53 PM By: Levan Hurst RN, BSN Signed: 02/01/2020 2:20:57 PM By: Sandre Kitty Entered By: Sandre Kitty on 01/28/2020 14:14:08 -------------------------------------------------------------------------------- Patient/Caregiver Education Details Patient Name: Date of Service: [place] Organ RLO S 12/17/2021andnbsp2:00 PM Medical Record  Number: [id number] Patient Account Number: [id number] Date  of Birth/Gender: Treating RN: 07-11-1965 (54 y.o. [name] Primary Care Physician: Juluis Mire Other Clinician: Referring Physician: Treating Physician/Extender: Fleet Contras in Treatment: 10 Education Assessment Education Provided To: Patient Education Topics Provided Wound/Skin Impairment: Methods: Explain/Verbal Responses: State content correctly [employer]) Signed: 01/28/2020 5:46:53 PM By: Levan Hurst RN, BSN Entered By: Levan Hurst on 01/28/2020 17:44:02 -------------------------------------------------------------------------------- Wound Assessment Details Patient Name: Date of Service: [name], [name] 01/28/2020 2:00 PM Medical Record Number: [id number] Patient Account Number: [id number] Date of Birth/Sex: Treating RN: Dec 24, 1965 (54 y.o. [name] Primary Care Rigoberto Repass: Juluis Mire Other Clinician: Referring Zackry Deines: Treating Aneesa Romey/Extender: Fleet Contras in Treatment: 17 Wound Status Wound Number: 3 Primary Etiology: Pressure Ulcer Wound Location: Left Metatarsal head fifth Wound Status: Open Wounding Event: Gradually Appeared Comorbid History: Hypotension, Type II Diabetes Date Acquired: 08/12/2019 Weeks Of Treatment: 17 Clustered Wound: No Photos Photo Uploaded By: Mikeal Hawthorne on 02/01/2020 10:49:02 Wound Measurements Length: (cm) 1 Width: (cm) 0.5 Depth: (cm) 0.1 Area: (cm) 0.393 Volume: (cm) 0.039 % Reduction in Area: 93.1% % Reduction in Volume: 93.1% Epithelialization: Medium (34-66%) Tunneling: No Undermining: No Wound Description Classification: Category/Stage IV Wound Margin: Flat and Intact Exudate Amount: Medium Exudate Type: Sanguinous Exudate Color: red Foul Odor After Cleansing: No Slough/Fibrino No Wound Bed Granulation Amount: Large (67-100%) Exposed  Structure Granulation Quality: Red, Pink Fascia Exposed: No Necrotic Amount: None Present (0%) Fat Layer (Subcutaneous Tissue) Exposed: Yes Tendon Exposed: No Muscle Exposed: No Joint Exposed: No Bone Exposed: No Treatment Notes Wound #3 (Metatarsal head fifth) Wound Laterality: Left Cleanser Peri-Wound Care Sween Lotion (Moisturizing lotion) Discharge Instruction: Apply moisturizing lotion as directed Topical Primary Dressing Hydrofera Blue Classic Foam, 2x2 in Discharge Instruction: Moisten with saline prior to applying to wound bed Secondary Dressing Woven Gauze Sponge, Non-Sterile 4x4 in Discharge Instruction: Apply over primary dressing as directed. Secured With Compression Wrap FourPress (4 layer compression wrap) Discharge Instruction: Apply four layer compression as directed. Compression Stockings Add-Ons Electronic Signature(s) Signed: 01/28/2020 5:28:37 PM By: Rhae Hammock RN Signed: 01/28/2020 5:46:53 PM By: Levan Hurst RN, BSN Entered By: Rhae Hammock on 01/28/2020 14:28:53 -------------------------------------------------------------------------------- Vitals Details Patient Name: Date of Service: [name], [name] 01/28/2020 2:00 PM Medical Record Number: [id number] Patient Account Number: [id number] Date of Birth/Sex: Treating RN: 05-30-1965 (54 y.o. [name] Primary Care Keyron Pokorski: Juluis Mire Other Clinician: Referring Damen Windsor: Treating Tonja Jezewski/Extender: Fleet Contras in Treatment: 17 Vital Signs Time Taken: 14:13 Temperature (F): 97.6 Height (in): 68 Pulse (bpm): 56 Weight (lbs): 235 Respiratory Rate (breaths/min): 20 Body Mass Index (BMI): 35.7 Blood Pressure (mmHg): 112/74 Reference Range: 80 - 120 mg / dl Electronic Signature(s) Signed: 02/01/2020 2:20:57 PM By: Sandre Kitty Entered By: Sandre Kitty on 01/28/2020 14:14:03

## 2020-02-01 NOTE — Therapy (Signed)
[facility] [place] [address] [place], [place], [address] Phone: [phone]   Fax:  [phone]  Physical Therapy Treatment  Patient Details  Name: [name] MRN: [id number] Date of Birth: 19-Mar-1965 Referring Provider (PT): Dr. Berline Chough   Encounter Date: 02/01/2020   PT End of Session - 02/01/20 1254    Visit Number 10    Number of Visits 12    Date for PT Re-Evaluation 02/09/20    Authorization Type [employer] Medicaid - 27 OT/PT/ST  No Submission needed at this time per    Authorization - Visit Number 10    Authorization - Number of Visits 27    PT Start Time 1103    PT Stop Time 1147    PT Time Calculation (min) 44 min    Activity Tolerance Patient tolerated treatment well    Behavior During Therapy [facility] for tasks assessed/performed           Past Medical History:  Diagnosis Date  . Diabetes mellitus without complication (HCC)   . History of cervical fracture     Past Surgical History:  Procedure Laterality Date  . CERVICAL SPINE SURGERY      There were no vitals filed for this visit.   Subjective Assessment - 02/01/20 1237    Subjective Still going to wound care; goes again tomorrow.  No issues to report.  Looked into the gym in [place] but it has closed.    Patient is accompained by: Family member;Corporate treasurer, manuel.   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 DM2    Patient Stated Goals Pt wants to be able to do exercises so he can move more and not feel so tight.    Currently in Pain? No/denies                             [facility] Adult PT Treatment/Exercise - 02/01/20 1238      Transfers   Transfers Lateral/Scoot Transfers    Lateral/Scoot Transfers 4: Min assist    Lateral/Scoot Transfer Details  (indicate cue type and reason) pt performed lateral leans to rotate hips and scoot forwards in w/c to prepare for slideboard placement.  Therapist assisted by adjusting foot placement and LE position to prevent sliding forwards on slideboard.  Transitioned from short sitting > long sitting on mat with therapist bringing LE up onto mat and wife preventing posterior LOB.      Balance   Balance Assessed Yes      Dynamic Sitting Balance   Dynamic Sitting - Balance Support No upper extremity supported;Right upper extremity supported;Left upper extremity supported;Feet supported    Dynamic Sitting - Level of Assistance 4: Min assist;3: Mod assist    Dynamic Sitting - Balance Activities Lateral lean/weight shifting;Forward lean/weight shifting    Sitting balance - Comments In long sitting on mat: reviewed how to use breathing to facilitate anterior lean (exhale) and posterior lean (inhale).  Utilized breathing and head movements to transition between long sitting <> lateral leaning on L and R elbows.  Cues to keep head forwards with leaning and pushing back up to sitting to prevent falling posteriorly. Also performed leaning forwards from semi-reclined position using breathing and head movement.  Performed x 5 reps laterally and anteriorly.  Therapeutic Activites    Therapeutic Activities Other Therapeutic Activities    Other Therapeutic Activities Discussed other options for gym membership after D/C from therapy.  PT to provide pt with information.  Pt to discuss with physician at wound care if he would be safe to perform WB on feet due to wounds; if cleared, will perform standing in standing frame.      Knee/Hip Exercises: Aerobic   Other Aerobic SCIFIT level 2 resistance x 8 minutes with bilat UE for ROM, endurance and strengthening; also placed LE on foot pedals for ankle, knee and hip ROM.  Required belt to keep LE internally rotated and assistance to keep feet on pedals                   PT Education - 02/01/20 1253    Education Details breathing for transitions, pt to discuss with wound care about WB through feet-standing frame, gyms for community wellness    Person(s) Educated Patient;Spouse    Methods Explanation;Demonstration    Comprehension Verbalized understanding;Returned demonstration            PT Short Term Goals - 01/10/20 1158      PT SHORT TERM GOAL #1   Title = LTG             PT Long Term Goals - 01/10/20 1158      PT LONG TERM GOAL #1   Title Pt will be able to perform slideboard transfer min assist once board is placed for improved mobility.    Baseline Mod Assist +1 with second person for safety going to mat but no second person needed going back to chair.    Time 4    Period Weeks    Status Revised    Target Date 02/09/20      PT LONG TERM GOAL #2   Title Pt will transition from long sit to short sit min assist for improved mobility.    Baseline Max A for long-sit to short-sit 01/10/20    Time 4    Period Weeks    Status Revised    Target Date 02/09/20      PT LONG TERM GOAL #3   Title Patient will perform long sit/circle without UE support >/= 5 minutes with min guard for improved ability to assist with ADLs in bed.    Baseline ~3 minutes with min A at this time 01/10/20    Time 4    Period Weeks    Status Revised    Target Date 02/09/20      PT LONG TERM GOAL #4   Title Pt will perform rolling in bed mod assist for improved mobility.    Baseline Min A for rolling L and R 01/10/20    Status Achieved                 Plan - 02/01/20 1255    Clinical Impression Statement Pt continues to demonstrate progress with sitting balance.  Pt requires decreased assistance for slideboard transfers but continues to experience posterior LOB when in long sitting.  Pt less able to use head movements for head/trunk righting due to fusion; focused on using breathing to assist with transitions.  If pt is clear for WB  through feet may try standing frame next session.    Personal Factors and Comorbidities Comorbidity 2    Comorbidities history of DVTs, DM2    Examination-Activity Limitations Bathing;Bed Mobility;Transfers;Dressing    Examination-Participation Restrictions Community Activity  Stability/Clinical Decision Making Evolving/Moderate complexity    Rehab Potential Good    PT Frequency 1x / week   plus eval   PT Duration 4 weeks    PT Treatment/Interventions ADLs/Self Care Home Management;Cryotherapy;DME Instruction;Moist Heat;Functional mobility training;Therapeutic activities;Therapeutic exercise;Balance training;Patient/family education;Neuromuscular re-education;Manual techniques;Wheelchair mobility training;Passive range of motion    PT Next Visit Plan Did wound care clear him for WB - standing frame?  Check LTG and give information about gyms/wellness options.    Consulted and Agree with Plan of Care Patient;Family member/caregiver    Family Member Consulted wife           Patient will benefit from skilled therapeutic intervention in order to improve the following deficits and impairments:  Decreased balance,Decreased mobility,Decreased knowledge of use of DME,Decreased activity tolerance,Decreased range of motion,Impaired flexibility,Decreased strength,Increased edema,Impaired sensation,Impaired tone,Impaired UE functional use  Visit Diagnosis: Other symptoms and signs involving the nervous system  Other disturbances of skin sensation  Muscle weakness (generalized)  Abnormal posture  Quadriplegia, C1-C4 incomplete (HCC)     Problem List Patient Active Problem List   Diagnosis Date Noted  . At risk for unstable body temperature 11/26/2019  . Wheelchair dependence 11/26/2019  . Pressure ulcer 09/22/2019  . Cellulitis 09/22/2019  . Hypokalemia 09/22/2019  . Spasticity 03/08/2019  . Erectile dysfunction due to diseases classified elsewhere 03/08/2019  . Presence of IVC  filter 01/22/2019  . Neurogenic bowel 01/22/2019  . Neurogenic bladder 01/22/2019  . Myofascial pain 01/22/2019  . 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 Quadriplegia (HCC) 12/30/2018    Dierdre Highman, PT, DPT 02/01/20    12:59 PM    [facility] [address] [place], [place], [address] Phone: [phone]   Fax:  [phone]  Name: [name] MRN: [id number] Date of Birth: September 03, 1965

## 2020-02-01 NOTE — Therapy (Signed)
[facility] [address] [place], [place], [address] Phone: [phone]   Fax:  [phone]  Occupational Therapy Treatment  Patient Details  Name: [name] MRN: [id number] Date of Birth: May 15, 1965 Referring Provider (OT): Dr. Dagoberto Ligas   Encounter Date: 02/01/2020   OT End of Session - 02/01/20 1225    Visit Number 9    Number of Visits 13    Date for OT Re-Evaluation 03/09/19    Authorization Type Medicaid    Authorization Time Period 27 visits combined    Authorization - Visit Number 9    Authorization - Number of Visits 12    OT Start Time 1146    OT Stop Time 1225    OT Time Calculation (min) 39 min    Activity Tolerance Patient tolerated treatment well    Behavior During Therapy WFL for tasks assessed/performed           Past Medical History:  Diagnosis Date  . Diabetes mellitus without complication ([place])   . History of cervical fracture     Past Surgical History:  Procedure Laterality Date  . CERVICAL SPINE SURGERY      There were no vitals filed for this visit.   Subjective Assessment - 02/01/20 1335    Subjective  Pt denies any pain this day.    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 DM    Patient Stated Goals get better    Currently in Pain? No/denies            Treatment:    OT Treatments/Exercises (OP) - 02/01/20 1339      Bed Mobility   Supine to Sit 2 Helpers;Moderate Assistance - Patient 50-74%   long sitting to sitting edge of mat     Transfers   Comments SB transfer from edge of mat to power chair with mod A and 2 helpers      ADLs   UB Dressing min A for assistance pulling shirt down in back. Pt doffed shirt with set up and donned all but pulling down in back - completed from power chair       Exercises   Exercises Hand      Hand Exercises   Other Hand Exercises Hand Gripper level 1 with LUE for grip strengthening with picking up 1 inch blocks - pt required several breaks for fatigue in LUE          Assessed present level with goals - See goals for progress.           OT Short Term Goals - 02/01/20 1206      OT SHORT TERM GOAL #1   Title I with inital HEP-    Status Achieved   putty HEP     OT SHORT TERM GOAL #2   Title Pt will donn shirt with min A    Status Achieved   mod A     OT SHORT TERM GOAL #3   Title Pt will report bilateral UE pain less than or equal to 4/10 during ADLS/ functional use.    Status Achieved      OT SHORT TERM GOAL #4   Title Pt will increase LUE grip strength to 17 lbs or greater for increased functional use.    Status On-going   12.5     OT  SHORT TERM GOAL #5   Title Pt will demonstrate improved LUE fine motor coordination for ADLs as evidenced by decreasing 9 hole peg test score to 80 secs or less.    Status Achieved      OT SHORT TERM GOAL #6   Title Pt will perform sliding board transfers with max A  for increased I with ADLs/ toilet transfers prn    Status Achieved      OT SHORT TERM GOAL #7   Title Pt / family will verbalize understanding of pain reduction strategies for UE's.    Status Achieved             OT Long Term Goals - 02/01/20 1207      OT LONG TERM GOAL #1   Title Pt will donn shirt with setup    Baseline mod A    Time 12    Period Weeks    Status On-going   min A for pulling shirt down in back.     OT LONG TERM GOAL #2   Title Pt will demonstrate improved bilateral UE external rotation to [facility]  as evidenced by pt's  increased ease with supporting himself in long and circle sitting, with pain less than or equal to 3/10.    Baseline limited bilateral external rotation in shoulders, pain 3-6/10 in UE's    Time 12    Period Weeks    Status On-going      OT LONG TERM GOAL #3   Title Pt will  demonstrate improved LUE fine motor coordination as evidenced  by decreasing 9 hole peg test score to 70 secs or less.    Baseline RUE 34.22, LUE 1 min 31 secs    Time 12    Period Weeks    Status Achieved   47.28 last  visit     OT LONG TERM GOAL #4   Title Pt/ family witllverbalize understanding of adapted strategies to maximize pt's I with ADLs/ IADLs.    Baseline needs updates/ reinforcement of strategies as pt is not perfroming consistently.    Time 12    Period Weeks    Status On-going      OT LONG TERM GOAL #5   Title Pt will increase RUE grip to 36 lbs and LUE grip to 20 lbs or greater in prep for bilateral functional use.    Baseline RUE 30.4, LUE 12.1    Time 12    Period Weeks    Status Partially Met   46.5 RUE 12.5 LUE     OT LONG TERM GOAL #6   Title Pt will perform sliding board transfers to assist with ADLS and in prep for toilet transfers with mod A to level surface.    Baseline total A +2    Time 12    Period Weeks    Status Achieved   sliding board transfer this day with mod A                Plan - 02/01/20 1220    Clinical Impression Statement Pt has met 6/7 STGs and 2/6 LTGs with progress towards unmet goals. Pt is demonstrating improvement with UB dressing and with overall trunk control increasing independence with sliding board transfers and overall mobility.    OT Occupational Profile and History Detailed Assessment- Review of Records and additional review of physical, cognitive, psychosocial history related to current functional performance    Occupational performance deficits (Please refer to evaluation for details): ADL's;IADL's;Work;Leisure;Social  Participation;Rest and Sleep;Play    Body Structure / Function / Physical Skills ADL;UE functional use;Endurance;Balance;Flexibility;Pain;FMC;ROM;GMC;Coordination;Sensation;Decreased knowledge of precautions;Decreased knowledge of use of DME;IADL;Strength;Dexterity;Mobility;Tone    Rehab Potential Good     Clinical Decision Making Limited treatment options, no task modification necessary    Comorbidities Affecting Occupational Performance: May have comorbidities impacting occupational performance    Modification or Assistance to Complete Evaluation  No modification of tasks or assist necessary to complete eval    OT Frequency 1x / week   plus eval   OT Duration 12 weeks    OT Treatment/Interventions Self-care/ADL training;Ultrasound;Energy conservation;Aquatic Therapy;DME and/or AE instruction;Patient/family education;Paraffin;Gait Training;Passive range of motion;Balance training;Fluidtherapy;Cryotherapy;Splinting;Therapist, nutritional;Contrast Bath;Electrical Stimulation;Moist Heat;Therapeutic exercise;Manual Therapy;Therapeutic activities;Cognitive remediation/compensation;Neuromuscular education    Plan anticipate d/c at end of December    Consulted and Agree with Plan of Care Patient;Family member/caregiver    Family Member Consulted wife,interpreter           Patient will benefit from skilled therapeutic intervention in order to improve the following deficits and impairments:   Body Structure / Function / Physical Skills: ADL,UE functional use,Endurance,Balance,Flexibility,Pain,FMC,ROM,GMC,Coordination,Sensation,Decreased knowledge of precautions,Decreased knowledge of use of DME,IADL,Strength,Dexterity,Mobility,Tone       Visit Diagnosis: Other symptoms and signs involving the nervous system  Muscle weakness (generalized)  Chronic right shoulder pain  Stiffness of left shoulder, not elsewhere classified  Stiffness of right shoulder, not elsewhere classified  Chronic left shoulder pain  Other disturbances of skin sensation    Problem List Patient Active Problem List   Diagnosis Date Noted  . At risk for unstable body temperature 11/26/2019  . Wheelchair dependence 11/26/2019  . Pressure ulcer 09/22/2019  . Cellulitis 09/22/2019  . Hypokalemia 09/22/2019  .  Spasticity 03/08/2019  . Erectile dysfunction due to diseases classified elsewhere 03/08/2019  . 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 Neurogenic bowel 01/22/2019  . Neurogenic bladder 01/22/2019  . Myofascial pain 01/22/2019  . Spinal cord injury at T1-T6 level ([place]) 12/30/2018  . Quadriplegia [facility]) 12/30/2018    Zachery Conch MOT, OTR/L  02/01/2020, 1:49 PM  [facility] [address] [place], [place], [address] Phone: [phone]   Fax:  [phone]  Name: [name] MRN: [id number] Date of Birth: January 17, 1966

## 2020-02-02 ENCOUNTER — Encounter (HOSPITAL_BASED_OUTPATIENT_CLINIC_OR_DEPARTMENT_OTHER): Payer: Medicaid Other | Admitting: Physician Assistant

## 2020-02-02 DIAGNOSIS — E11621 Type 2 diabetes mellitus with foot ulcer: Secondary | ICD-10-CM | POA: Diagnosis not present

## 2020-02-02 NOTE — Progress Notes (Signed)
[name], [name] ([id number]) Visit Report for 02/02/2020 Arrival Information Details Patient Name: Date of Service: [name] 02/02/2020 11:15 A M Medical Record Number: [id number] Patient Account Number: [id number] Date of Birth/Sex: Treating RN: 1965-11-14 (54 y.o. [name] [name]: [name] Other Clinician: Referring [name]: Treating [name] in Treatment: 17 Visit Information History Since Last Visit Added or deleted any medications: No Patient Arrived: Ambulatory Any new allergies or adverse reactions: No Arrival Time: 11:25 Had a fall or experienced change in No Accompanied By: wife and interpreter activities of daily living that may affect Transfer Assistance: None risk of falls: Patient Identification Verified: Yes Signs or symptoms of abuse/neglect since last visito No Secondary Verification Process Completed: Yes Hospitalized since last visit: No Patient Requires Transmission-Based Precautions: No Implantable device outside of the clinic excluding No Patient Has Alerts: Yes cellular tissue based products placed in the center Patient Alerts: Translator Required since last visit: right ABI 1.61 Has Dressing in Place as Prescribed: Yes Left ABI 1.36 Has Compression in Place as Prescribed: Yes Pain Present Now: No Electronic Signature([name]: 02/02/2020 5:18:14 PM By: [name] Entered By: [name] on 02/02/2020 11:28:35 -------------------------------------------------------------------------------- Compression Therapy Details Patient Name: Date of Service: [name] RLO [name] 02/02/2020 11:15 A M Medical Record Number: [id number] Patient Account Number: [id number] Date of Birth/Sex: Treating RN: 11/04/1965 (54 y.o. [name] [name]: [name] Other Clinician: Referring [name]: Treating [name]/Extender: [name] in Treatment: 17 Compression Therapy Performed for Wound Assessment: Wound #3 Left Metatarsal head fifth Performed By: Clinician Benjaman Kindler, Compression Type: Four Layer Electronic Signature([name]: 02/02/2020 5:18:14 PM By: [name] Entered By: [name] on 02/02/2020 11:29:13 -------------------------------------------------------------------------------- Encounter Discharge Information Details Patient Name: Date of Service: [name] 02/02/2020 11:15 A M Medical Record Number: [id number] Patient Account Number: [id number] Date of Birth/Sex: Treating RN: [birth year] (54 y.o. [name] Shacoya Burkhammer: [name] Other Clinician: Referring [name]: Treating [name]/Extender: [name] in Treatment: 17 Encounter Discharge Information Items Discharge Condition: Stable Ambulatory Status: Wheelchair Discharge Destination: Home Transportation: Private Auto Accompanied By: wife Schedule Follow-up Appointment: Yes Clinical Summary of [name]: Electronic Signature([name]: 02/02/2020 5:18:14 PM By: [name] Entered By: [name] on 02/02/2020 11:29:46 -------------------------------------------------------------------------------- Patient/Caregiver Education Details Patient Name: Date of Service: [name] RLO [name] 12/22/2021andnbsp11:15 A M Medical Record Number: [id number] Patient Account Number: [id number] Date of Birth/Gender: Treating RN: Oct 06, 1965 (54 y.o. [name]: [name] Other Clinician: Referring [name]: Treating [name]/Extender: [name] in Treatment: 17 Education Assessment Education Provided To: Patient Education Topics Provided Wound/Skin Impairment: Handouts: Skin Care Do'[name] and Dont'[name] Methods: Explain/Verbal Responses: Reinforcements needed Electronic Signature([name]:  02/02/2020 5:18:14 PM By: [name] Entered By: [name] on 02/02/2020 11:29:35 -------------------------------------------------------------------------------- Wound Assessment Details Patient Name: Date of Service: [name] 02/02/2020 11:15 A M Medical Record Number: [id number] Patient Account Number: [id number] Date of Birth/Sex: Treating RN: [birth year] (54 y.o. [name] [name]: [name] Other Clinician: Referring [name]: Treating [name]/Extender: [name] in Treatment: 17 Wound Status Wound Number: 3 Primary Etiology: Pressure Ulcer Wound Location: Left Metatarsal head fifth Wound Status: Open Wounding Event: Gradually Appeared Date Acquired: 08/12/2019 Weeks Of Treatment: 17 Clustered Wound: No Wound Measurements Length: (cm) 1 Width: (cm) 0.5 Depth: (cm) 0.1 Area: (cm) 0.393 Volume: (cm)  0.039 % Reduction in Area: 93.1% % Reduction in Volume: 93.1% Wound Description Classification: Category/Stage IV Treatment Notes Wound #3 (Metatarsal head fifth) Wound Laterality: Left Cleanser Peri-Wound [name] Sween Lotion (Moisturizing lotion) Discharge Instruction: Apply moisturizing lotion as directed Topical Primary Dressing Hydrofera Blue Classic Foam, 2x2 in Discharge Instruction: Moisten with saline prior to applying to wound bed Secondary Dressing Woven Gauze Sponge, Non-Sterile 4x4 in Discharge Instruction: Apply over primary dressing as directed. Secured With Compression Wrap FourPress (4 layer compression wrap) Discharge Instruction: Apply four layer compression as directed. Compression Stockings Add-Ons Electronic Signature([name]: 02/02/2020 5:18:14 PM By: [name] Entered By: [name] on 02/02/2020 11:28:57 -------------------------------------------------------------------------------- Vitals Details Patient Name: Date of Service: [name]  02/02/2020 11:15 A M Medical Record Number: [id number] Patient Account Number: [id number] Date of Birth/Sex: Treating RN: April 17, 1965 (54 y.o. [name] [name]: [name] Other Clinician: Referring [name]: Treating [name]/Extender: [name] in Treatment: 17 Vital Signs Time Taken: 11:27 Temperature (F): 97.6 Height (in): 68 Pulse (bpm): 58 Weight (lbs): 235 Respiratory Rate (breaths/min): 20 Body Mass Index (BMI): 35.7 Blood Pressure (mmHg): 122/75 Reference Range: 80 - 120 mg / dl Electronic Signature([name]: 02/02/2020 5:18:14 PM By: [name] Entered By: [name] on 02/02/2020 11:28:51

## 2020-02-03 NOTE — Progress Notes (Signed)
BURKE, TERRY ([id number]) Visit Report for 02/02/2020 SuperBill Details Patient Name: Date of Service: [name] 02/02/2020 Medical Record Number: [id number] Patient Account Number: [id number] Date of Birth/Sex: Treating RN: 24-Oct-1965 (54 y.o. 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

## 2020-02-08 ENCOUNTER — Ambulatory Visit: Payer: Medicaid Other

## 2020-02-08 ENCOUNTER — Ambulatory Visit: Payer: Medicaid Other | Admitting: Occupational Therapy

## 2020-02-08 NOTE — Therapy (Signed)
[facility] [address] [place], [place], [address] Phone: [phone]   Fax:  [phone]  Patient Details  Name: [name] MRN: [id number] Date of Birth: 09/05/1965 Referring Provider:  No ref. provider found  Encounter Date: 02/08/2020  PHYSICAL THERAPY DISCHARGE SUMMARY / Non visit discharge  Visits from Start of Care: 10  Current functional level related to goals / functional outcomes: Pt cancelled last visit that was planned discharge. Pt has made progress over course of therapy. Is min/mod assist with slideboard transfers now. Is limited on his performance on this at home, however, using Hoyer lift most of time. Does perform 2-3x/week per his report with his sons. He has shown improvements in sitting balance and is able to maintain short sit with some pertubations. He is challenged in long sit and was not able to reach the timed goal for this. If has something to hold to on legs he has better stability. Pt is min assist with rolling. Still needing max assist with supine to sit of +2. See current progress towards goals for last assessment. PT discharging at this time as planned.   Remaining deficits: quadriplegia   Education / Equipment: HEP  Plan: Patient agrees to discharge.  Patient goals were not met. Patient is being discharged due to lack of progress.  ?????           PT Short Term Goals - 01/10/20 1158      PT SHORT TERM GOAL #1   Title = LTG           PT Long Term Goals - 01/10/20 1158      PT LONG TERM GOAL #1   Title Pt will be able to perform slideboard transfer min assist once board is placed for improved mobility.    Baseline Mod Assist +1 with second person for safety going to mat but no second person needed going back to chair.    Time 4    Period Weeks    Status Revised    Target Date 02/09/20      PT LONG TERM GOAL #2   Title Pt will transition from long sit to short sit min  assist for improved mobility.    Baseline Max A for long-sit to short-sit 01/10/20    Time 4    Period Weeks    Status Revised    Target Date 02/09/20      PT LONG TERM GOAL #3   Title Patient will perform long sit/circle without UE support >/= 5 minutes with min guard for improved ability to assist with ADLs in bed.    Baseline ~3 minutes with min A at this time 01/10/20    Time 4    Period Weeks    Status Revised    Target Date 02/09/20      PT LONG TERM GOAL #4   Title Pt will perform rolling in bed mod assist for improved mobility.    Baseline Min A for rolling L and R 01/10/20    Status Achieved            Emily A Parker, PT, DPT, NCS 02/08/2020, 10:40 AM  [facility] [address] [place], [place], [address] Phone: [phone]   Fax:  [phone] 

## 2020-02-09 ENCOUNTER — Encounter (HOSPITAL_BASED_OUTPATIENT_CLINIC_OR_DEPARTMENT_OTHER): Payer: Medicaid Other | Admitting: Physician Assistant

## 2020-02-09 ENCOUNTER — Other Ambulatory Visit: Payer: Self-pay

## 2020-02-09 DIAGNOSIS — E11621 Type 2 diabetes mellitus with foot ulcer: Secondary | ICD-10-CM | POA: Diagnosis not present

## 2020-02-14 NOTE — Progress Notes (Signed)
[name] ([id number]) Visit Report for 02/09/2020 Arrival Information Details Patient Name: Date of Service: [name] 02/09/2020 10:45 A M Medical Record Number: [id number] Patient Account Number: [id number] Date of Birth/Sex: Treating RN: 08/03/1965 (56 y.o. [name] Primary Care Lynzi Meulemans: Juluis Mire Other Clinician: Referring Lameisha Schuenemann: Treating [name]/Extender: Joyce Gross in Treatment: 18 Visit Information History Since Last Visit Added or deleted any medications: No Patient Arrived: Wheel Chair Any new allergies or adverse reactions: No Arrival Time: 10:50 Had a fall or experienced change in No Accompanied By: spouse activities of daily living that may affect Transfer Assistance: None risk of falls: Patient Identification Verified: Yes Signs or symptoms of abuse/neglect since last visito No Secondary Verification Process Completed: Yes Hospitalized since last visit: No Patient Requires Transmission-Based Precautions: No Implantable device outside of the clinic excluding No Patient Has Alerts: Yes cellular tissue based products placed in the center Patient Alerts: Translator Required since last visit: right ABI 1.61 Has Dressing in Place as Prescribed: Yes Left ABI 1.36 Has Compression in Place as Prescribed: Yes Pain Present Now: No Electronic Signature(s) Signed: 02/14/2020 2:31:34 PM By: Baruch Gouty RN, BSN Entered By: Baruch Gouty on 02/09/2020 10:59:15 -------------------------------------------------------------------------------- Compression Therapy Details Patient Name: Date of Service: [name], [name] 02/09/2020 10:45 A M Medical Record Number: [id number] Patient Account Number: [id number] Date of Birth/Sex: Treating RN: 01/12/1966 (55 y.o. [name] Primary Care Ariya Bohannon: Juluis Mire Other Clinician: Referring Ewell Benassi: Treating [name]/Extender: Joyce Gross in Treatment: 18 Compression Therapy Performed for Wound Assessment: Wound #6 Left,Proximal,Posterior Lower Leg Performed By: Clinician Baruch Gouty, RN Compression Type: Four Layer Electronic Signature(s) Signed: 02/14/2020 2:31:34 PM By: Baruch Gouty RN, BSN Entered By: Baruch Gouty on 02/09/2020 11:21:10 -------------------------------------------------------------------------------- Encounter Discharge Information Details Patient Name: Date of Service: [name], [name] 02/09/2020 10:45 A M Medical Record Number: [id number] Patient Account Number: [id number] Date of Birth/Sex: Treating RN: 23-Nov-1965 (55 y.o. [name] Primary Care Valeri Sula: Juluis Mire Other Clinician: Referring Franziska Podgurski: Treating [name]/Extender: Joyce Gross in Treatment: 18 Encounter Discharge Information Items Discharge Condition: Stable Ambulatory Status: Wheelchair Discharge Destination: Home Transportation: Private Auto Accompanied By: spouse Schedule Follow-up Appointment: Yes Clinical Summary of Care: Patient Declined Electronic Signature(s) Signed: 02/14/2020 2:31:34 PM By: Baruch Gouty RN, BSN Entered By: Baruch Gouty on 02/09/2020 11:23:04 -------------------------------------------------------------------------------- Patient/Caregiver Education Details Patient Name: Date of Service: [name] RLO S 12/29/2021andnbsp10:45 A M Medical Record Number: [id number] Patient Account Number: [id number] Date of Birth/Gender: Treating RN: 08/10/1965 (55 y.o. [name] Primary Care Physician: Juluis Mire Other Clinician: Referring Physician: Treating Physician/Extender: Joyce Gross in Treatment: 18 Education Assessment Education Provided To: Patient and Caregiver Education Topics Provided Pressure: Methods: Explain/Verbal Responses: Reinforcements needed, State content  correctly Wound/Skin Impairment: Methods: Explain/Verbal Responses: Reinforcements needed, State content correctly Electronic Signature(s) Signed: 02/14/2020 2:31:34 PM By: Baruch Gouty RN, BSN Entered By: Baruch Gouty on 02/09/2020 11:22:48 -------------------------------------------------------------------------------- Wound Assessment Details Patient Name: Date of Service: [name], [name] 02/09/2020 10:45 A M Medical Record Number: [id number] Patient Account Number: [id number] Date of Birth/Sex: Treating RN: [birth year] (55 y.o. [name] Primary Care Deannah Rossi: Juluis Mire Other Clinician: Referring Aaden Buckman: Treating [name]/Extender: Joyce Gross in Treatment: 18 Wound Status Wound Number: 3 Primary Etiology: Pressure Ulcer Wound Location: Left Metatarsal head fifth Wound Status: Open Wounding Event: Gradually Appeared Comorbid History: Hypotension, Type  II Diabetes Date Acquired: 08/12/2019 Weeks Of Treatment: 18 Clustered Wound: No Wound Measurements Length: (cm) 1 Width: (cm) 0.5 Depth: (cm) 0.1 Area: (cm) 0.393 Volume: (cm) 0.039 % Reduction in Area: 93.1% % Reduction in Volume: 93.1% Epithelialization: Medium (34-66%) Tunneling: No Undermining: No Wound Description Classification: Category/Stage IV Wound Margin: Flat and Intact Exudate Amount: Small Exudate Type: Serosanguineous Exudate Color: red, brown Foul Odor After Cleansing: No Slough/Fibrino No Wound Bed Granulation Amount: Large (67-100%) Exposed Structure Granulation Quality: Red Fascia Exposed: No Necrotic Amount: None Present (0%) Fat Layer (Subcutaneous Tissue) Exposed: Yes Tendon Exposed: No Muscle Exposed: No Joint Exposed: No Bone Exposed: No Treatment Notes Wound #3 (Metatarsal head fifth) Wound Laterality: Left Cleanser Peri-Wound Care Sween Lotion (Moisturizing lotion) Discharge Instruction: Apply moisturizing lotion as  directed Topical Primary Dressing Hydrofera Blue Classic Foam, 2x2 in Discharge Instruction: Moisten with saline prior to applying to wound bed Secondary Dressing Woven Gauze Sponge, Non-Sterile 4x4 in Discharge Instruction: Apply over primary dressing as directed. Secured With Compression Wrap FourPress (4 layer compression wrap) Discharge Instruction: Apply four layer compression as directed. Compression Stockings Add-Ons Electronic Signature(s) Signed: 02/14/2020 2:31:34 PM By: Zenaida Deed RN, BSN Entered By: Zenaida Deed on 02/09/2020 11:18:57 -------------------------------------------------------------------------------- Wound Assessment Details Patient Name: Date of Service: [name], [name] 02/09/2020 10:45 A M Medical Record Number: [id number] Patient Account Number: [id number] Date of Birth/Sex: Treating RN: May 16, 1965 (55 y.o. [name] Primary Care Kelita Wallis: [name] Other Clinician: Referring Marcelis Wissner: Treating Zaid Tomes/Extender: Blanchie Dessert in Treatment: 18 Wound Status Wound Number: 6 Primary Etiology: Diabetic Wound/Ulcer of the Lower Extremity Wound Location: Left, Proximal, Posterior Lower Leg Secondary Etiology: Lymphedema Wounding Event: Blister Wound Status: Open Date Acquired: 02/06/2020 Comorbid History: Hypotension, Type II Diabetes Weeks Of Treatment: 0 Clustered Wound: No Wound Measurements Length: (cm) 0.8 Width: (cm) 0.8 Depth: (cm) 0.1 Area: (cm) 0.503 Volume: (cm) 0.05 % Reduction in Area: 0% % Reduction in Volume: 0% Epithelialization: Small (1-33%) Tunneling: No Undermining: No Wound Description Classification: Grade 1 Wound Margin: Flat and Intact Exudate Amount: Small Exudate Type: Serous Exudate Color: amber Foul Odor After Cleansing: No Slough/Fibrino No Wound Bed Granulation Amount: Large (67-100%) Exposed Structure Granulation Quality: Pink Fascia Exposed:  No Necrotic Amount: None Present (0%) Fat Layer (Subcutaneous Tissue) Exposed: No Tendon Exposed: No Muscle Exposed: No Joint Exposed: No Bone Exposed: No Limited to Skin Breakdown Treatment Notes Wound #6 (Lower Leg) Wound Laterality: Left, Posterior, Proximal Cleanser Peri-Wound Care Sween Lotion (Moisturizing lotion) Discharge Instruction: Apply moisturizing lotion as directed Topical Primary Dressing Hydrofera Blue Classic Foam, 2x2 in Discharge Instruction: Moisten with saline prior to applying to wound bed Secondary Dressing Woven Gauze Sponge, Non-Sterile 4x4 in Discharge Instruction: Apply over primary dressing as directed. Secured With Compression Wrap FourPress (4 layer compression wrap) Discharge Instruction: Apply four layer compression as directed. Compression Stockings Add-Ons Electronic Signature(s) Signed: 02/14/2020 2:31:34 PM By: Zenaida Deed RN, BSN Entered By: Zenaida Deed on 02/09/2020 11:20:48 -------------------------------------------------------------------------------- Vitals Details Patient Name: Date of Service: [name], [name] 02/09/2020 10:45 A M Medical Record Number: [id number] Patient Account Number: [id number] Date of Birth/Sex: Treating RN: [birth year] (55 y.o. [name] Primary Care Karas Pickerill: [name] Other Clinician: Referring Shanena Pellegrino: Treating Leira Regino/Extender: Blanchie Dessert in Treatment: 18 Vital Signs Time Taken: 10:59 Temperature (F): 97.8 Height (in): 68 Pulse (bpm): 62 Source: Stated Respiratory Rate (breaths/min): 18 Weight (lbs): 235 Blood Pressure (mmHg): 96/61 Source: Stated Reference Range: 80 -  120 mg / dl Body Mass Index (BMI): 35.7 Electronic Signature(s) Signed: 02/14/2020 2:31:34 PM By: Zenaida Deed RN, BSN Entered By: Zenaida Deed on 02/09/2020 10:59:59

## 2020-02-15 NOTE — Progress Notes (Signed)
[name], [name] ([id number]) Visit Report for 02/09/2020 SuperBill Details Patient Name: Date of Service: [name] 02/09/2020 Medical Record Number: [id number] Patient Account Number: [id number] Date of Birth/Sex: Treating RN: 27-Jul-1965 (55 y.o. 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

## 2020-02-18 ENCOUNTER — Other Ambulatory Visit: Payer: Self-pay

## 2020-02-18 ENCOUNTER — Encounter (HOSPITAL_BASED_OUTPATIENT_CLINIC_OR_DEPARTMENT_OTHER): Payer: Medicaid Other | Attending: Internal Medicine | Admitting: Internal Medicine

## 2020-02-18 DIAGNOSIS — W1789XS Other fall from one level to another, sequela: Secondary | ICD-10-CM | POA: Insufficient documentation

## 2020-02-18 DIAGNOSIS — E11621 Type 2 diabetes mellitus with foot ulcer: Secondary | ICD-10-CM | POA: Insufficient documentation

## 2020-02-18 DIAGNOSIS — L97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: Secondary | ICD-10-CM | POA: Insufficient documentation

## 2020-02-18 DIAGNOSIS — L97528 Non-pressure chronic ulcer of other part of left foot with other specified severity: Secondary | ICD-10-CM | POA: Diagnosis not present

## 2020-02-18 DIAGNOSIS — S24101S Unspecified injury at T1 level of thoracic spinal cord, sequela: Secondary | ICD-10-CM | POA: Diagnosis not present

## 2020-02-18 DIAGNOSIS — L97529 Non-pressure chronic ulcer of other part of left foot with unspecified severity: Secondary | ICD-10-CM | POA: Diagnosis present

## 2020-02-18 DIAGNOSIS — L97518 Non-pressure chronic ulcer of other part of right foot with other specified severity: Secondary | ICD-10-CM | POA: Insufficient documentation

## 2020-02-18 DIAGNOSIS — L89623 Pressure ulcer of left heel, stage 3: Secondary | ICD-10-CM | POA: Insufficient documentation

## 2020-02-18 DIAGNOSIS — Z7901 Long term (current) use of anticoagulants: Secondary | ICD-10-CM | POA: Insufficient documentation

## 2020-02-18 DIAGNOSIS — L89613 Pressure ulcer of right heel, stage 3: Secondary | ICD-10-CM | POA: Diagnosis not present

## 2020-02-18 DIAGNOSIS — T310 Burns involving less than 10% of body surface: Secondary | ICD-10-CM | POA: Diagnosis not present

## 2020-02-18 DIAGNOSIS — G8221 Paraplegia, complete: Secondary | ICD-10-CM | POA: Insufficient documentation

## 2020-02-18 DIAGNOSIS — X088XXD Exposure to other specified smoke, fire and flames, subsequent encounter: Secondary | ICD-10-CM | POA: Diagnosis not present

## 2020-02-18 DIAGNOSIS — T24211D Burn of second degree of right thigh, subsequent encounter: Secondary | ICD-10-CM | POA: Insufficient documentation

## 2020-02-18 NOTE — Progress Notes (Signed)
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 3 EST PT Modifier: Quantity: 1 Physician Procedures : CPT4 Code Description Modifier [id number] - WC PHYS LEVEL 3 - EST PT ICD-10 Diagnosis Description 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

## 2020-02-22 NOTE — Progress Notes (Signed)
ALBY, SCHWABE ([id number]) Visit Report for 02/18/2020 Arrival Information Details Patient Name: Date of Service: [name] 02/18/2020 10:45 A M Medical Record Number: [id number] Patient Account Number: [id number] Date of Birth/Sex: Treating RN: 1965-05-06 (55 y.o. [name] Primary Care Ailene Royal: Juluis Mire Other Clinician: Referring October Peery: Treating Jahzir Strohmeier/Extender: Fleet Contras in Treatment: 20 Visit Information History Since Last Visit Added or deleted any medications: No Patient Arrived: Wheel Chair Any new allergies or adverse reactions: No Arrival Time: 11:05 Had a fall or experienced change in No Accompanied By: self activities of daily living that may affect Transfer Assistance: None risk of falls: Patient Identification Verified: Yes Signs or symptoms of abuse/neglect since last visito No Secondary Verification Process Completed: Yes Hospitalized since last visit: No Patient Requires Transmission-Based Precautions: No Implantable device outside of the clinic excluding No Patient Has Alerts: Yes cellular tissue based products placed in the center Patient Alerts: Translator Required since last visit: right ABI 1.61 Has Dressing in Place as Prescribed: Yes Left ABI 1.36 Pain Present Now: No Electronic Signature(s) Signed: 02/22/2020 10:39:15 AM By: Sandre Kitty Entered By: Sandre Kitty on 02/18/2020 11:11:56 -------------------------------------------------------------------------------- Clinic Level of Care Assessment Details Patient Name: Date of Service: [name] 02/18/2020 10:45 A M Medical Record Number: [id number] Patient Account Number: [id number] Date of Birth/Sex: Treating RN: [birth year] (55 y.o. [name] Primary Care Alexey Rhoads: Juluis Mire Other Clinician: Referring Eugean Arnott: Treating Athalie Newhard/Extender: Fleet Contras in Treatment: 20 Clinic  Level of Care Assessment Items TOOL 4 Quantity Score '[]'  - 0 Use when only an EandM is performed on FOLLOW-UP visit ASSESSMENTS - Nursing Assessment / Reassessment X- 1 10 Reassessment of Co-morbidities (includes updates in patient status) X- 1 5 Reassessment of Adherence to Treatment Plan ASSESSMENTS - Wound and Skin A ssessment / Reassessment '[]'  - 0 Simple Wound Assessment / Reassessment - one wound X- 2 5 Complex Wound Assessment / Reassessment - multiple wounds '[]'  - 0 Dermatologic / Skin Assessment (not related to wound area) ASSESSMENTS - Focused Assessment X- 1 5 Circumferential Edema Measurements - multi extremities '[]'  - 0 Nutritional Assessment / Counseling / Intervention X- 1 5 Lower Extremity Assessment (monofilament, tuning fork, pulses) '[]'  - 0 Peripheral Arterial Disease Assessment (using hand held doppler) ASSESSMENTS - Ostomy and/or Continence Assessment and Care '[]'  - 0 Incontinence Assessment and Management '[]'  - 0 Ostomy Care Assessment and Management (repouching, etc.) PROCESS - Coordination of Care X - Simple Patient / Family Education for ongoing care 1 15 '[]'  - 0 Complex (extensive) Patient / Family Education for ongoing care X- 1 10 Staff obtains Programmer, systems, Records, T Results / Process Orders est '[]'  - 0 Staff telephones HHA, Nursing Homes / Clarify orders / etc '[]'  - 0 Routine Transfer to another Facility (non-emergent condition) '[]'  - 0 Routine Hospital Admission (non-emergent condition) '[]'  - 0 New Admissions / Biomedical engineer / Ordering NPWT Apligraf, etc. , '[]'  - 0 Emergency Hospital Admission (emergent condition) X- 1 10 Simple Discharge Coordination '[]'  - 0 Complex (extensive) Discharge Coordination PROCESS - Special Needs '[]'  - 0 Pediatric / Minor Patient Management '[]'  - 0 Isolation Patient Management '[]'  - 0 Hearing / Language / Visual special needs '[]'  - 0 Assessment of Community assistance (transportation, D/C planning, etc.) '[]'   - 0 Additional assistance / Altered mentation '[]'  - 0 Support Surface(s) Assessment (bed, cushion, seat, etc.) INTERVENTIONS - Wound Cleansing / Measurement '[]'  - 0 Simple Wound Cleansing - one  wound X- 2 5 Complex Wound Cleansing - multiple wounds X- 1 5 Wound Imaging (photographs - any number of wounds) '[]'  - 0 Wound Tracing (instead of photographs) '[]'  - 0 Simple Wound Measurement - one wound X- 2 5 Complex Wound Measurement - multiple wounds INTERVENTIONS - Wound Dressings X - Small Wound Dressing one or multiple wounds 1 10 '[]'  - 0 Medium Wound Dressing one or multiple wounds '[]'  - 0 Large Wound Dressing one or multiple wounds X- 1 5 Application of Medications - topical '[]'  - 0 Application of Medications - injection INTERVENTIONS - Miscellaneous '[]'  - 0 External ear exam '[]'  - 0 Specimen Collection (cultures, biopsies, blood, body fluids, etc.) '[]'  - 0 Specimen(s) / Culture(s) sent or taken to Lab for analysis '[]'  - 0 Patient Transfer (multiple staff / Civil Service fast streamer / Similar devices) '[]'  - 0 Simple Staple / Suture removal (25 or less) '[]'  - 0 Complex Staple / Suture removal (26 or more) '[]'  - 0 Hypo / Hyperglycemic Management (close monitor of Blood Glucose) '[]'  - 0 Ankle / Brachial Index (ABI) - do not check if billed separately X- 1 5 Vital Signs Has the patient been seen at the hospital within the last three years: Yes Total Score: 115 Level Of Care: New/Established - Level 3 Electronic Signature(s) Signed: 02/18/2020 4:46:42 PM By: Baruch Gouty RN, BSN Entered By: Baruch Gouty on 02/18/2020 11:59:33 -------------------------------------------------------------------------------- Encounter Discharge Information Details Patient Name: Date of Service: [name], [name] 02/18/2020 10:45 A M Medical Record Number: [id number] Patient Account Number: [id number] Date of Birth/Sex: Treating RN: 07-Aug-1965 (55 y.o. [name] Primary Care Aylen Rambert: Juluis Mire  Other Clinician: Referring Bliss Tsang: Treating Deborh Pense/Extender: Fleet Contras in Treatment: 20 Encounter Discharge Information Items Discharge Condition: Stable Ambulatory Status: Wheelchair Discharge Destination: Home Transportation: Private Auto Accompanied By: spouse Schedule Follow-up Appointment: Yes Clinical Summary of Care: Patient Declined Electronic Signature(s) Signed: 02/18/2020 4:46:42 PM By: Baruch Gouty RN, BSN Entered By: Baruch Gouty on 02/18/2020 16:36:10 -------------------------------------------------------------------------------- Lower Extremity Assessment Details Patient Name: Date of Service: [name], [place] RLO S 02/18/2020 10:45 A M Medical Record Number: [id number] Patient Account Number: [id number] Date of Birth/Sex: Treating RN: Nov 12, 1965 (55 y.o. [name] Primary Care Milliani Herrada: Juluis Mire Other Clinician: Referring Aalaya Yadao: Treating Icelyn Navarrete/Extender: Fleet Contras in Treatment: 20 Edema Assessment Assessed: Shirlyn Goltz: Yes] Patrice Paradise: No] Edema: [Left: No] [Right: Yes] Calf Left: Right: Point of Measurement: From Medial Instep 36 cm Ankle Left: Right: Point of Measurement: From Medial Instep 24 cm Vascular Assessment Pulses: Dorsalis Pedis Palpable: [Left:Yes] Electronic Signature(s) Signed: 02/18/2020 4:33:40 PM By: Deon Pilling Entered By: Deon Pilling on 02/18/2020 11:29:34 -------------------------------------------------------------------------------- Multi Wound Chart Details Patient Name: Date of Service: [name], [name] 02/18/2020 10:45 A M Medical Record Number: [id number] Patient Account Number: [id number] Date of Birth/Sex: Treating RN: Oct 09, 1965 (55 y.o. [name] Primary Care Jennae Hakeem: Juluis Mire Other Clinician: Referring Rashaad Hallstrom: Treating Norwood Quezada/Extender: Fleet Contras in Treatment: 20 Vital  Signs Height(in): 68 Pulse(bpm): 36 Weight(lbs): 235 Blood Pressure(mmHg): 124/81 Body Mass Index(BMI): 36 Temperature(F): 97.9 Respiratory Rate(breaths/min): 18 Photos: [3:No Photos Left Metatarsal head fifth] [6:No Photos Left, Proximal, Posterior Lower Leg] [N/A:N/A N/A] Wound Location: [3:Gradually Appeared] [6:Blister] [N/A:N/A] Wounding Event: [3:Pressure Ulcer] [6:Diabetic Wound/Ulcer of the Lower] [N/A:N/A] Primary Etiology: [3:N/A] [6:Extremity Lymphedema] [N/A:N/A] Secondary Etiology: [3:Hypotension, Type II Diabetes] [6:Hypotension, Type II Diabetes] [N/A:N/A] Comorbid History: [3:08/12/2019] [6:02/06/2020] [N/A:N/A] Date Acquired: [3:20] [6:1] [N/A:N/A] Weeks of Treatment: [3:Open] [  6:Open] [N/A:N/A] Wound Status: [3:0x0x0] [6:0.5x0.5x0.1] [N/A:N/A] Measurements L x W x D (cm) [3:0] [1:6.109] [N/A:N/A] A (cm) : rea [3:0] [6:0.02] [N/A:N/A] Volume (cm) : [3:100.00%] [6:61.00%] [N/A:N/A] % Reduction in A rea: [3:100.00%] [6:60.00%] [N/A:N/A] % Reduction in Volume: [3:Category/Stage IV] [6:Grade 1] [N/A:N/A] Classification: [3:None Present] [6:Small] [N/A:N/A] Exudate A mount: [3:N/A] [6:Serous] [N/A:N/A] Exudate Type: [3:N/A] [6:amber] [N/A:N/A] Exudate Color: [3:Flat and Intact] [6:Flat and Intact] [N/A:N/A] Wound Margin: [3:None Present (0%)] [6:Large (67-100%)] [N/A:N/A] Granulation A mount: [3:N/A] [6:Pink] [N/A:N/A] Granulation Quality: [3:None Present (0%)] [6:None Present (0%)] [N/A:N/A] Necrotic A mount: [3:Fat Layer (Subcutaneous Tissue): Yes Fascia: No] [N/A:N/A] Exposed Structures: [3:Fascia: No Tendon: No Muscle: No Joint: No Bone: No Large (67-100%)] [6:Fat Layer (Subcutaneous Tissue): No Tendon: No Muscle: No Joint: No Bone: No Limited to Skin Breakdown Medium (34-66%)] [N/A:N/A] Treatment Notes Electronic Signature(s) Signed: 02/18/2020 4:46:42 PM By: Baruch Gouty RN, BSN Signed: 02/18/2020 4:48:18 PM By: Linton Ham MD Entered By: Linton Ham  on 02/18/2020 12:07:23 -------------------------------------------------------------------------------- Multi-Disciplinary Care Plan Details Patient Name: Date of Service: [name], [place] RLO S 02/18/2020 10:45 A M Medical Record Number: [id number] Patient Account Number: [id number] Date of Birth/Sex: Treating RN: October 11, 1965 (55 y.o. [name] Primary Care Braydon Kullman: Juluis Mire Other Clinician: Referring Ameliana Brashear: Treating Layan Zalenski/Extender: Fleet Contras in Treatment: 20 Active Inactive Nutrition Nursing Diagnoses: Impaired glucose control: actual or potential Goals: Patient/caregiver verbalizes understanding of need to maintain therapeutic glucose control per primary care physician Date Initiated: 10/01/2019 Target Resolution Date: 03/17/2020 Goal Status: Active Interventions: Provide education on elevated blood sugars and impact on wound healing Notes: Wound/Skin Impairment Nursing Diagnoses: Impaired tissue integrity Goals: Ulcer/skin breakdown will have a volume reduction of 50% by week 8 Date Initiated: 10/01/2019 Date Inactivated: 12/03/2019 Target Resolution Date: 12/03/2019 Unmet Reason: paraplegia and Goal Status: Unmet offloading management Ulcer/skin breakdown will have a volume reduction of 80% by week 12 Date Initiated: 12/03/2019 Date Inactivated: 01/14/2020 Target Resolution Date: 12/31/2019 Goal Status: Met Ulcer/skin breakdown will heal within 14 weeks Date Initiated: 01/14/2020 Target Resolution Date: 03/03/2020 Goal Status: Active Interventions: Provide education on ulcer and skin care Notes: Electronic Signature(s) Signed: 02/18/2020 4:46:42 PM By: Baruch Gouty RN, BSN Entered By: Baruch Gouty on 02/18/2020 11:57:05 -------------------------------------------------------------------------------- Pain Assessment Details Patient Name: Date of Service: [name], [name] 02/18/2020 10:45 A M Medical Record  Number: [id number] Patient Account Number: [id number] Date of Birth/Sex: Treating RN: 1965/05/03 (55 y.o. [name] Primary Care Taray Normoyle: Juluis Mire Other Clinician: Referring Markeise Mathews: Treating Jaecion Dempster/Extender: Fleet Contras in Treatment: 20 Active Problems Location of Pain Severity and Description of Pain Patient Has Paino No Site Locations Pain Management and Medication Current Pain Management: Electronic Signature(s) Signed: 02/18/2020 4:46:42 PM By: Baruch Gouty RN, BSN Signed: 02/22/2020 10:39:15 AM By: Sandre Kitty Entered By: Sandre Kitty on 02/18/2020 11:12:20 -------------------------------------------------------------------------------- Patient/Caregiver Education Details Patient Name: Date of Service: [name] RLO S 1/7/2022andnbsp10:45 A M Medical Record Number: [id number] Patient Account Number: [id number] Date of Birth/Gender: Treating RN: 03/20/1965 (55 y.o. [name] Primary Care Physician: Juluis Mire Other Clinician: Referring Physician: Treating Physician/Extender: Fleet Contras in Treatment: 20 Education Assessment Education Provided To: Patient Education Topics Provided Pressure: Methods: Explain/Verbal Responses: Reinforcements needed, State content correctly Venous: Methods: Explain/Verbal Responses: Reinforcements needed, State content correctly Wound/Skin Impairment: Methods: Explain/Verbal Responses: Reinforcements needed, State content correctly Electronic Signature(s) Signed: 02/18/2020 4:46:42 PM By: Baruch Gouty RN, BSN Entered By: Baruch Gouty on 02/18/2020 11:58:30 -------------------------------------------------------------------------------- Wound  Assessment Details Patient Name: Date of Service: [name] 02/18/2020 10:45 A M Medical Record Number: [id number] Patient Account Number: [id number] Date of  Birth/Sex: Treating RN: [birth year] (55 y.o. [name] Primary Care Sayaka Hoeppner: Juluis Mire Other Clinician: Referring Zenith Lamphier: Treating Serenah Mill/Extender: Fleet Contras in Treatment: 20 Wound Status Wound Number: 3 Primary Etiology: Pressure Ulcer Wound Location: Left Metatarsal head fifth Wound Status: Open Wounding Event: Gradually Appeared Comorbid History: Hypotension, Type II Diabetes Date Acquired: 08/12/2019 Weeks Of Treatment: 20 Clustered Wound: No Wound Measurements Length: (cm) Width: (cm) Depth: (cm) Area: (cm) Volume: (cm) 0 % Reduction in Area: 100% 0 % Reduction in Volume: 100% 0 Epithelialization: Large (67-100%) 0 Tunneling: No 0 Undermining: No Wound Description Classification: Category/Stage IV Wound Margin: Flat and Intact Exudate Amount: None Present Foul Odor After Cleansing: No Slough/Fibrino No Wound Bed Granulation Amount: None Present (0%) Exposed Structure Necrotic Amount: None Present (0%) Fascia Exposed: No Fat Layer (Subcutaneous Tissue) Exposed: Yes Tendon Exposed: No Muscle Exposed: No Joint Exposed: No Bone Exposed: No Electronic Signature(s) Signed: 02/18/2020 4:33:40 PM By: Deon Pilling Signed: 02/18/2020 4:46:42 PM By: Baruch Gouty RN, BSN Entered By: Deon Pilling on 02/18/2020 11:29:49 -------------------------------------------------------------------------------- Wound Assessment Details Patient Name: Date of Service: [name], [name] 02/18/2020 10:45 A M Medical Record Number: [id number] Patient Account Number: [id number] Date of Birth/Sex: Treating RN: 11-Mar-1965 (55 y.o. [name] Primary Care Dmarco Baldus: Juluis Mire Other Clinician: Referring Taneah Masri: Treating Efrat Zuidema/Extender: Fleet Contras in Treatment: 20 Wound Status Wound Number: 6 Primary Etiology: Diabetic Wound/Ulcer of the Lower Extremity Wound Location: Left, Proximal,  Posterior Lower Leg Secondary Etiology: Lymphedema Wounding Event: Blister Wound Status: Open Date Acquired: 02/06/2020 Comorbid History: Hypotension, Type II Diabetes Weeks Of Treatment: 1 Clustered Wound: No Wound Measurements Length: (cm) 0.5 Width: (cm) 0.5 Depth: (cm) 0.1 Area: (cm) 0.196 Volume: (cm) 0.02 % Reduction in Area: 61% % Reduction in Volume: 60% Epithelialization: Medium (34-66%) Tunneling: No Undermining: No Wound Description Classification: Grade 1 Wound Margin: Flat and Intact Exudate Amount: Small Exudate Type: Serous Exudate Color: amber Foul Odor After Cleansing: No Slough/Fibrino No Wound Bed Granulation Amount: Large (67-100%) Exposed Structure Granulation Quality: Pink Fascia Exposed: No Necrotic Amount: None Present (0%) Fat Layer (Subcutaneous Tissue) Exposed: No Tendon Exposed: No Muscle Exposed: No Joint Exposed: No Bone Exposed: No Limited to Skin Breakdown Treatment Notes Wound #6 (Lower Leg) Wound Laterality: Left, Posterior, Proximal Cleanser Peri-Wound Care Topical Primary Dressing KerraCel Ag Gelling Fiber Dressing, 4x5 in (silver alginate) Discharge Instruction: Apply silver alginate to wound bed as instructed Secondary Dressing ComfortFoam Border, 4x4 in (silicone border) Discharge Instruction: Apply over primary dressing or large bandaid Secured With Compression Wrap Compression Stockings Add-Ons Electronic Signature(s) Signed: 02/18/2020 4:33:40 PM By: Deon Pilling Signed: 02/18/2020 4:46:42 PM By: Baruch Gouty RN, BSN Entered By: Deon Pilling on 02/18/2020 11:30:14 -------------------------------------------------------------------------------- Vitals Details Patient Name: Date of Service: [name], [name] 02/18/2020 10:45 A M Medical Record Number: [id number] Patient Account Number: [id number] Date of Birth/Sex: Treating RN: 1965-12-27 (55 y.o. [name] Primary Care Deni Lefever: Juluis Mire Other Clinician: Referring Tanea Moga: Treating Dvonte Gatliff/Extender: Fleet Contras in Treatment: 20 Vital Signs Time Taken: 11:11 Temperature (F): 97.9 Height (in): 68 Pulse (bpm): 66 Weight (lbs): 235 Respiratory Rate (breaths/min): 18 Body Mass Index (BMI): 35.7 Blood Pressure (mmHg): 124/81 Reference Range: 80 - 120 mg / dl Electronic Signature(s) Signed: 02/22/2020 10:39:15 AM By: Sandre Kitty Entered  BySandre Kitty on 02/18/2020 11:12:14

## 2020-03-03 ENCOUNTER — Encounter: Payer: Medicaid Other | Admitting: Physical Medicine and Rehabilitation

## 2020-03-03 ENCOUNTER — Other Ambulatory Visit: Payer: Self-pay

## 2020-03-03 ENCOUNTER — Encounter (HOSPITAL_BASED_OUTPATIENT_CLINIC_OR_DEPARTMENT_OTHER): Payer: Medicaid Other | Admitting: Internal Medicine

## 2020-03-03 DIAGNOSIS — E11621 Type 2 diabetes mellitus with foot ulcer: Secondary | ICD-10-CM | POA: Diagnosis not present

## 2020-03-03 NOTE — Progress Notes (Signed)
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 legs daily. - at night after removing stocking Off-Loading: Multipodus Splint to: - sage boots both feet daily Turn and reposition every 2 hours WOUND #3R: - Metatarsal head fifth Wound Laterality: Left Peri-Wound Care: Sween Lotion (Moisturizing lotion) 1 x Per Week/7 Days Discharge Instructions: Apply moisturizing lotion as directed Prim Dressing: KerraCel Ag Gelling Fiber Dressing, 2x2 in (silver alginate) 1 x Per Week/7 Days ary Discharge Instructions: Apply silver alginate to wound bed as instructed Secondary Dressing: Woven Gauze Sponge, Non-Sterile 4x4 in 1 x Per Week/7 Days Discharge Instructions: Apply over primary dressing as directed. Secondary Dressing: Optifoam Non-Adhesive Dressing, 4x4 in 1 x Per Week/7 Days Discharge Instructions: Apply over primary dressing cut to make foam donut Com pression Wrap: FourPress (4 layer compression wrap) 1 x Per Week/7 Days Discharge Instructions: Apply four layer compression as directed. 1. I am  going to use silver alginate under 4layer compression on the left leg. Hopefully this area will be closed next week 2. The patient and his wife were instructed not to leave the foot tightly against the bottom of the foot rest of his wheelchair. 3. The degree of swelling in the left leg is a bit disconcerting in terms of how we are going to  control this. I do not think this is predominantly responsible for the wound on the bottom of his foot however it does add to the complexity of maintaining skin integrity in his lower extremities and dorsal foot 4. I saw no evidence of infection Electronic Signature(s) Signed: 03/03/2020 4:10:40 PM By: Linton Ham MD Entered By: Linton Ham on 03/03/2020 12:59:56 -------------------------------------------------------------------------------- SuperBill Details Patient Name: Date of Service: Corwin Levins, [place] S 03/03/2020 Medical Record Number: [id number] Patient Account Number: [id number] Date of Birth/Sex: Treating RN: April 12, 1965 (55 y.o. 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

## 2020-03-06 NOTE — Progress Notes (Signed)
[name], [name] ([id number]) Visit Report for 03/03/2020 Arrival Information Details Patient Name: Date of Service: [name] 03/03/2020 11:30 A M Medical Record Number: [id number] Patient Account Number: [id number] Date of Birth/Sex: Treating RN: 1965-05-27 (55 y.o. [name] Primary Care Akari Defelice: [name] Other Clinician: Referring Celes Dedic: Treating Shewanda Sharpe/Extender: [name] in Treatment: 22 Visit Information History Since Last Visit Added or deleted any medications: No Patient Arrived: Wheel Chair Any new allergies or adverse reactions: No Arrival Time: 11:20 Had a fall or experienced change in No Accompanied By: wife activities of daily living that may affect Transfer Assistance: None risk of falls: Patient Identification Verified: Yes Signs or symptoms of abuse/neglect since last visito No Secondary Verification Process Completed: Yes Hospitalized since last visit: No Patient Requires Transmission-Based Precautions: No Implantable device outside of the clinic excluding No Patient Has Alerts: Yes cellular tissue based products placed in the center Patient Alerts: Translator Required since last visit: right ABI 1.61 Has Dressing in Place as Prescribed: Yes Left ABI 1.36 Pain Present Now: No Electronic Signature(s) Signed: 03/06/2020 9:38:00 AM By: Karl Ito Entered By: Karl Ito on 03/03/2020 11:21:06 -------------------------------------------------------------------------------- Compression Therapy Details Patient Name: Date of Service: [name] RLO S 03/03/2020 11:30 A M Medical Record Number: [id number] Patient Account Number: [id number] Date of Birth/Sex: Treating RN: Jul 04, 1965 (55 y.o. [name] Primary Care Montague Corella: [name] Other Clinician: Referring Gray Doering: Treating Talik Casique/Extender: [name] in Treatment: 22 Compression Therapy  Performed for Wound Assessment: Wound #3R Left Metatarsal head fifth Performed By: Clinician Fonnie Mu, RN Compression Type: Three Layer Post Procedure Diagnosis Same as Pre-procedure Electronic Signature(s) Signed: 03/03/2020 4:31:51 PM By: Zenaida Deed RN, BSN Entered By: Zenaida Deed on 03/03/2020 12:15:35 -------------------------------------------------------------------------------- Encounter Discharge Information Details Patient Name: Date of Service: [name], [name] 03/03/2020 11:30 A M Medical Record Number: [id number] Patient Account Number: [id number] Date of Birth/Sex: Treating RN: 07/11/1965 (55 y.o. [name] Primary Care Mattisen Pohlmann: [name] Other Clinician: Referring Zalea Pete: Treating Filbert Craze/Extender: [name] in Treatment: 22 Encounter Discharge Information Items Discharge Condition: Stable Ambulatory Status: Wheelchair Discharge Destination: Home Transportation: Private Auto Accompanied By: spouse Schedule Follow-up Appointment: Yes Clinical Summary of Care: Patient Declined Electronic Signature(s) Signed: 03/03/2020 4:31:51 PM By: Zenaida Deed RN, BSN Entered By: Zenaida Deed on 03/03/2020 13:06:45 -------------------------------------------------------------------------------- Lower Extremity Assessment Details Patient Name: Date of Service: [name], [place] RLO S 03/03/2020 11:30 A M Medical Record Number: [id number] Patient Account Number: [id number] Date of Birth/Sex: Treating RN: 05-10-1965 (55 y.o. [name] Primary Care Deaisa Merida: [name] Other Clinician: Referring Ladina Shutters: Treating Sylus Stgermain/Extender: [name] in Treatment: 22 Edema Assessment Assessed: Kyra Searles: Yes] Franne Forts: No] Edema: [Left: Yes] [Right: Yes] Calf Left: Right: Point of Measurement: From Medial Instep 45 cm Ankle Left: Right: Point of Measurement: From Medial  Instep 28 cm Vascular Assessment Pulses: Dorsalis Pedis Palpable: [Left:Yes] Electronic Signature(s) Signed: 03/03/2020 4:31:03 PM By: Shawn Stall Entered By: Shawn Stall on 03/03/2020 11:51:13 -------------------------------------------------------------------------------- Multi Wound Chart Details Patient Name: Date of Service: [name], [name] 03/03/2020 11:30 A M Medical Record Number: [id number] Patient Account Number: [id number] Date of Birth/Sex: Treating RN: Nov 14, 1965 (55 y.o. [name] Primary Care Elton Heid: [name] Other Clinician: Referring Chen Holzman: Treating Reesha Debes/Extender: [name] in Treatment: 22 Vital Signs Height(in): 68 Pulse(bpm): 54 Weight(lbs): 235 Blood Pressure(mmHg): 132/72 Body Mass Index(BMI): 36 Temperature(F): 97.8 Respiratory Rate(breaths/min): 18 Photos: [  3R:No Photos Left Metatarsal head fifth] [6:No Photos Left, Proximal, Posterior Lower Leg] [N/A:N/A N/A] Wound Location: [3R:Gradually Appeared] [6:Blister] [N/A:N/A] Wounding Event: [3R:Pressure Ulcer] [6:Diabetic Wound/Ulcer of the Lower] [N/A:N/A] Primary Etiology: [3R:N/A] [6:Extremity Lymphedema] [N/A:N/A] Secondary Etiology: [3R:Hypotension, Type II Diabetes] [6:N/A] [N/A:N/A] Comorbid History: [3R:08/12/2019] [6:02/06/2020] [N/A:N/A] Date Acquired: [3R:22] [6:3] [N/A:N/A] Weeks of Treatment: [3R:Open] [6:Open] [N/A:N/A] Wound Status: [3R:Yes] [6:No] [N/A:N/A] Wound Recurrence: [3R:0.5x0.7x0.1] [6:0x0x0] [N/A:N/A] Measurements L x W x D (cm) [3R:0.275] [6:0] [N/A:N/A] A (cm) : rea [3R:0.027] [6:0] [N/A:N/A] Volume (cm) : [3R:95.10%] [6:100.00%] [N/A:N/A] % Reduction in A rea: [3R:95.20%] [6:100.00%] [N/A:N/A] % Reduction in Volume: [3R:Category/Stage IV] [6:Grade 1] [N/A:N/A] Classification: [3R:Medium] [6:N/A] [N/A:N/A] Exudate A mount: [3R:Serosanguineous] [6:N/A] [N/A:N/A] Exudate Type: [3R:red, brown] [6:N/A]  [N/A:N/A] Exudate Color: [3R:Flat and Intact] [6:N/A] [N/A:N/A] Wound Margin: [3R:Large (67-100%)] [6:N/A] [N/A:N/A] Granulation A mount: [3R:Pink] [6:N/A] [N/A:N/A] Granulation Quality: [3R:None Present (0%)] [6:N/A] [N/A:N/A] Necrotic A mount: [3R:Fat Layer (Subcutaneous Tissue): Yes N/A] [N/A:N/A] Exposed Structures: [3R:Fascia: No Tendon: No Muscle: No Joint: No Bone: No Large (67-100%)] [6:N/A] [N/A:N/A] Epithelialization: [3R:Compression Therapy] [6:N/A] [N/A:N/A] Treatment Notes Electronic Signature(s) Signed: 03/03/2020 4:10:40 PM By: Baltazar Najjar MD Signed: 03/03/2020 4:31:51 PM By: Zenaida Deed RN, BSN Entered By: Baltazar Najjar on 03/03/2020 12:51:39 -------------------------------------------------------------------------------- Multi-Disciplinary Care Plan Details Patient Name: Date of Service: [name], [place] RLO S 03/03/2020 11:30 A M Medical Record Number: [id number] Patient Account Number: [id number] Date of Birth/Sex: Treating RN: [birth year] (55 y.o. [name] Primary Care Rachella Basden: [name] Other Clinician: Referring Damyah Gugel: Treating Sindy Mccune/Extender: [name] in Treatment: 22 Active Inactive Nutrition Nursing Diagnoses: Impaired glucose control: actual or potential Goals: Patient/caregiver verbalizes understanding of need to maintain therapeutic glucose control per primary care physician Date Initiated: 10/01/2019 Target Resolution Date: 03/17/2020 Goal Status: Active Interventions: Provide education on elevated blood sugars and impact on wound healing Notes: Electronic Signature(s) Signed: 03/03/2020 4:31:51 PM By: Zenaida Deed RN, BSN Entered By: Zenaida Deed on 03/03/2020 11:20:02 -------------------------------------------------------------------------------- Pain Assessment Details Patient Name: Date of Service: [name], [name] 03/03/2020 11:30 A M Medical Record Number:  [id number] Patient Account Number: [id number] Date of Birth/Sex: Treating RN: 31-Aug-1965 (55 y.o. [name] Primary Care Zuley Lutter: [name] Other Clinician: Referring Alfredo Spong: Treating Dereon Corkery/Extender: [name] in Treatment: 22 Active Problems Location of Pain Severity and Description of Pain Patient Has Paino No Site Locations Pain Management and Medication Current Pain Management: Electronic Signature(s) Signed: 03/03/2020 4:31:51 PM By: Zenaida Deed RN, BSN Signed: 03/06/2020 9:38:00 AM By: Karl Ito Entered By: Karl Ito on 03/03/2020 11:23:01 -------------------------------------------------------------------------------- Patient/Caregiver Education Details Patient Name: Date of Service: [name] RLO S 1/21/2022andnbsp11:30 A M Medical Record Number: [id number] Patient Account Number: [id number] Date of Birth/Gender: Treating RN: Feb 16, 1965 (55 y.o. [name] Primary Care Physician: [name] Other Clinician: Referring Physician: Treating Physician/Extender: [name] in Treatment: 22 Education Assessment Education Provided To: Patient Education Topics Provided Elevated Blood Sugar/ Impact on Healing: Methods: Explain/Verbal Responses: Reinforcements needed, State content correctly Pressure: Methods: Explain/Verbal Responses: Reinforcements needed, State content correctly Wound/Skin Impairment: Methods: Explain/Verbal Responses: Reinforcements needed, State content correctly Electronic Signature(s) Signed: 03/03/2020 4:31:51 PM By: Zenaida Deed RN, BSN Entered By: Zenaida Deed on 03/03/2020 11:20:40 -------------------------------------------------------------------------------- Wound Assessment Details Patient Name: Date of Service: [name], [name] 03/03/2020 11:30 A M Medical Record Number: [id number] Patient Account Number:  [id number] Date of Birth/Sex: Treating RN: 26-Dec-1965 (55 y.o. [name] Primary Care Ayinde Swim: [name]  Other Clinician: Referring Lorre Opdahl: Treating Ulah Olmo/Extender: [name] in Treatment: 22 Wound Status Wound Number: 3R Primary Etiology: Pressure Ulcer Wound Location: Left Metatarsal head fifth Wound Status: Open Wounding Event: Gradually Appeared Comorbid History: Hypotension, Type II Diabetes Date Acquired: 08/12/2019 Weeks Of Treatment: 22 Clustered Wound: No Wound Measurements Length: (cm) 0.5 Width: (cm) 0.7 Depth: (cm) 0.1 Area: (cm) 0.275 Volume: (cm) 0.027 % Reduction in Area: 95.1% % Reduction in Volume: 95.2% Epithelialization: Large (67-100%) Tunneling: No Undermining: No Wound Description Classification: Category/Stage IV Wound Margin: Flat and Intact Exudate Amount: Medium Exudate Type: Serosanguineous Exudate Color: red, brown Foul Odor After Cleansing: No Slough/Fibrino No Wound Bed Granulation Amount: Large (67-100%) Exposed Structure Granulation Quality: Pink Fascia Exposed: No Necrotic Amount: None Present (0%) Fat Layer (Subcutaneous Tissue) Exposed: Yes Tendon Exposed: No Muscle Exposed: No Joint Exposed: No Bone Exposed: No Treatment Notes Wound #3R (Metatarsal head fifth) Wound Laterality: Left Cleanser Peri-Wound Care Sween Lotion (Moisturizing lotion) Discharge Instruction: Apply moisturizing lotion as directed Topical Primary Dressing KerraCel Ag Gelling Fiber Dressing, 2x2 in (silver alginate) Discharge Instruction: Apply silver alginate to wound bed as instructed Secondary Dressing Woven Gauze Sponge, Non-Sterile 4x4 in Discharge Instruction: Apply over primary dressing as directed. Optifoam Non-Adhesive Dressing, 4x4 in Discharge Instruction: Apply over primary dressing cut to make foam donut Secured With Compression Wrap FourPress (4 layer compression wrap) Discharge  Instruction: Apply four layer compression as directed. Compression Stockings Add-Ons Electronic Signature(s) Signed: 03/03/2020 4:31:03 PM By: Shawn Stall Signed: 03/03/2020 4:31:51 PM By: Zenaida Deed RN, BSN Entered By: Shawn Stall on 03/03/2020 11:52:00 -------------------------------------------------------------------------------- Wound Assessment Details Patient Name: Date of Service: [name], [name] 03/03/2020 11:30 A M Medical Record Number: [id number] Patient Account Number: [id number] Date of Birth/Sex: Treating RN: April 13, 1965 (55 y.o. [name] Primary Care Joneisha Miles: [name] Other Clinician: Referring Benjermin Korber: Treating Cuauhtemoc Huegel/Extender: [name] in Treatment: 22 Wound Status Wound Number: 6 Primary Etiology: Diabetic Wound/Ulcer of the Lower Extremity Wound Location: Left, Proximal, Posterior Lower Leg Secondary Etiology: Lymphedema Wounding Event: Blister Wound Status: Open Date Acquired: 02/06/2020 Weeks Of Treatment: 3 Clustered Wound: No Wound Measurements Length: (cm) Width: (cm) Depth: (cm) Area: (cm) Volume: (cm) 0 % Reduction in Area: 100% 0 % Reduction in Volume: 100% 0 0 0 Wound Description Classification: Grade 1 Electronic Signature(s) Signed: 03/03/2020 4:31:51 PM By: Zenaida Deed RN, BSN Signed: 03/06/2020 9:38:00 AM By: Karl Ito Entered By: Karl Ito on 03/03/2020 11:30:04 -------------------------------------------------------------------------------- Vitals Details Patient Name: Date of Service: [name], [name] 03/03/2020 11:30 A M Medical Record Number: [id number] Patient Account Number: [id number] Date of Birth/Sex: Treating RN: September 04, 1965 (55 y.o. [name] Primary Care Zhavia Cunanan: [name] Other Clinician: Referring Jannet Calip: Treating Shakeel Disney/Extender: [name] in Treatment: 22 Vital Signs Time  Taken: 11:21 Temperature (F): 97.8 Height (in): 68 Pulse (bpm): 54 Weight (lbs): 235 Respiratory Rate (breaths/min): 18 Body Mass Index (BMI): 35.7 Blood Pressure (mmHg): 132/72 Reference Range: 80 - 120 mg / dl Electronic Signature(s) Signed: 03/06/2020 9:38:00 AM By: Karl Ito Entered By: Karl Ito on 03/03/2020 11:22:47

## 2020-03-08 ENCOUNTER — Other Ambulatory Visit: Payer: Self-pay

## 2020-03-08 ENCOUNTER — Encounter: Payer: Self-pay | Admitting: Physical Medicine and Rehabilitation

## 2020-03-08 ENCOUNTER — Encounter
Payer: Medicaid Other | Attending: Physical Medicine and Rehabilitation | Admitting: Physical Medicine and Rehabilitation

## 2020-03-08 VITALS — BP 89/58 | HR 57 | Temp 97.7°F

## 2020-03-08 DIAGNOSIS — M7918 Myalgia, other site: Secondary | ICD-10-CM | POA: Insufficient documentation

## 2020-03-08 DIAGNOSIS — Z993 Dependence on wheelchair: Secondary | ICD-10-CM | POA: Insufficient documentation

## 2020-03-08 DIAGNOSIS — G8250 Quadriplegia, unspecified: Secondary | ICD-10-CM | POA: Diagnosis not present

## 2020-03-08 NOTE — Patient Instructions (Signed)
1.  Patient here for trigger point injections for  Consent done and on chart.  Cleaned areas with alcohol and injected using a 27 gauge 1.5 inch needle  Injected  Using 1% Lidocaine with no EPI  Upper traps B/L Levators B/L Posterior scalenes B/L Middle scalenes  Splenius Capitus B/L Pectoralis Major B/L Rhomboids B/L x2 Infraspinatus B/L Teres Major/minor B/L Thoracic paraspinals Lumbar paraspinals Other injections-    Patient's level of pain prior was 6/10 Current level of pain after injections is 4/10- much looser  There was no bleeding or complications.  Patient was advised to drink a lot of water on day after injections to flush system Will have increased soreness for 12-48 hours after injections.  Can use Lidocaine patches the day AFTER injections Can use theracane on day of injections in places didn't inject Can use heating pad 4-6 hours AFTER injections  2. Doesn't need refills  3. BP low 80s/50s, but asymptomatic- will not try to add midodrine, florinef, etc and monitor since asymptomatic.   4. F/U in 6-8 weeks.

## 2020-03-08 NOTE — Progress Notes (Signed)
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   BP is 80s/50s this AM however denies dizziness/lightheadedness- or any bad Sx's.   Everything is doing OK. Just came in for trigger point injections.    Exam: Has trigger points in all the muscles injected as below.    Plan: 1.  Patient here for trigger point injections for  Consent done and on chart.  Cleaned areas with alcohol and injected using a 27 gauge 1.5 inch needle  Injected  Using 1% Lidocaine with no EPI  Upper traps B/L Levators B/L Posterior scalenes B/L Middle scalenes  Splenius Capitus B/L Pectoralis Major B/L Rhomboids B/L x2 Infraspinatus B/L Teres Major/minor B/L Thoracic paraspinals Lumbar paraspinals Other injections-    Patient's level of pain prior was 6/10 Current level of pain after injections is 4/10- much looser  There was no bleeding or complications.  Patient was advised to drink a lot of water on day after injections to flush system Will have increased soreness for 12-48 hours after injections.  Can use Lidocaine patches the day AFTER injections Can use theracane on day of injections in places didn't inject Can use heating pad 4-6 hours AFTER injections  2. Doesn't need refills  3. BP low 80s/50s, but asymptomatic- will not try to add midodrine, florinef, etc and monitor since asymptomatic.   4. F/U in 6-8 weeks.    I spent a total of 20 minutes on visit- as detailed above.

## 2020-03-10 ENCOUNTER — Other Ambulatory Visit: Payer: Self-pay

## 2020-03-10 ENCOUNTER — Encounter (HOSPITAL_BASED_OUTPATIENT_CLINIC_OR_DEPARTMENT_OTHER): Payer: Medicaid Other | Admitting: Internal Medicine

## 2020-03-10 DIAGNOSIS — E11621 Type 2 diabetes mellitus with foot ulcer: Secondary | ICD-10-CM | POA: Diagnosis not present

## 2020-03-10 NOTE — Progress Notes (Signed)
Harry, [name] ([id number]) Visit Report for 03/10/2020 Arrival Information Details Patient Name: Date of Service: [name] 03/10/2020 8:45 A M Medical Record Number: [id number] Patient Account Number: [id number] Date of Birth/Sex: Treating RN: 1965-06-10 (55 y.o. [name] Primary Care Okla Qazi: [name] Other Clinician: Referring [name]: Treating [name]/Extender: [name] in Treatment: 23 Visit Information History Since Last Visit Added or deleted any medications: No Patient Arrived: Wheel Chair Any new allergies or adverse reactions: No Arrival Time: 08:45 Had a fall or experienced change in No Accompanied By: wife activities of daily living that may affect Transfer Assistance: Stretcher risk of falls: Patient Identification Verified: Yes Signs or symptoms of abuse/neglect since last visito No Secondary Verification Process Completed: Yes Hospitalized since last visit: No Patient Requires Transmission-Based Precautions: No Implantable device outside of the clinic excluding No Patient Has Alerts: Yes cellular tissue based products placed in the center Patient Alerts: Translator Required since last visit: right ABI 1.61 Has Dressing in Place as Prescribed: Yes Left ABI 1.36 Pain Present Now: No Electronic Signature(s) Signed: 03/10/2020 10:25:52 AM By: Karl Ito Entered By: Karl Ito on 03/10/2020 08:46:14 -------------------------------------------------------------------------------- Clinic Level of Care Assessment Details Patient Name: Date of Service: [name] 03/10/2020 8:45 A M Medical Record Number: [id number] Patient Account Number: [id number] Date of Birth/Sex: Treating RN: June 03, 1965 (55 y.o. [name] Primary Care [name]: [name] Other Clinician: Referring [name]: Treating [name]/Extender: [name] in Treatment:  23 Clinic Level of Care Assessment Items TOOL 4 Quantity Score []  - 0 Use when only an EandM is performed on FOLLOW-UP visit ASSESSMENTS - Nursing Assessment / Reassessment X- 1 10 Reassessment of Co-morbidities (includes updates in patient status) X- 1 5 Reassessment of Adherence to Treatment Plan ASSESSMENTS - Wound and Skin A ssessment / Reassessment X - Simple Wound Assessment / Reassessment - one wound 1 5 []  - 0 Complex Wound Assessment / Reassessment - multiple wounds []  - 0 Dermatologic / Skin Assessment (not related to wound area) ASSESSMENTS - Focused Assessment X- 1 5 Circumferential Edema Measurements - multi extremities []  - 0 Nutritional Assessment / Counseling / Intervention X- 1 5 Lower Extremity Assessment (monofilament, tuning fork, pulses) []  - 0 Peripheral Arterial Disease Assessment (using hand held doppler) ASSESSMENTS - Ostomy and/or Continence Assessment and Care []  - 0 Incontinence Assessment and Management []  - 0 Ostomy Care Assessment and Management (repouching, etc.) PROCESS - Coordination of Care X - Simple Patient / Family Education for ongoing care 1 15 []  - 0 Complex (extensive) Patient / Family Education for ongoing care X- 1 10 Staff obtains , Records, T Results / Process Orders est []  - 0 Staff telephones HHA, Nursing Homes / Clarify orders / etc []  - 0 Routine Transfer to another Facility (non-emergent condition) []  - 0 Routine Hospital Admission (non-emergent condition) []  - 0 New Admissions / / Ordering NPWT Apligraf, etc. , []  - 0 Emergency Hospital Admission (emergent condition) X- 1 10 Simple Discharge Coordination []  - 0 Complex (extensive) Discharge Coordination PROCESS - Special Needs []  - 0 Pediatric / Minor Patient Management []  - 0 Isolation Patient Management []  - 0 Hearing / Language / Visual special needs []  - 0 Assessment of Community assistance (transportation, D/C  planning, etc.) []  - 0 Additional assistance / Altered mentation []  - 0 Support Surface(s) Assessment (bed, cushion, seat, etc.) INTERVENTIONS - Wound Cleansing / Measurement X - Simple Wound Cleansing - one  wound 1 5 []  - 0 Complex Wound Cleansing - multiple wounds X- 1 5 Wound Imaging (photographs - any number of wounds) []  - 0 Wound Tracing (instead of photographs) X- 1 5 Simple Wound Measurement - one wound []  - 0 Complex Wound Measurement - multiple wounds INTERVENTIONS - Wound Dressings []  - 0 Small Wound Dressing one or multiple wounds []  - 0 Medium Wound Dressing one or multiple wounds []  - 0 Large Wound Dressing one or multiple wounds []  - 0 Application of Medications - topical []  - 0 Application of Medications - injection INTERVENTIONS - Miscellaneous []  - 0 External ear exam []  - 0 Specimen Collection (cultures, biopsies, blood, body fluids, etc.) []  - 0 Specimen(s) / Culture(s) sent or taken to Lab for analysis []  - 0 Patient Transfer (multiple staff / / Similar devices) []  - 0 Simple Staple / Suture removal (25 or less) []  - 0 Complex Staple / Suture removal (26 or more) []  - 0 Hypo / Hyperglycemic Management (close monitor of Blood Glucose) []  - 0 Ankle / Brachial Index (ABI) - do not check if billed separately X- 1 5 Vital Signs Has the patient been seen at the hospital within the last three years: Yes Total Score: 85 Level Of Care: New/Established - Level 3 Electronic Signature(s) Signed: 03/10/2020 5:52:16 PM By: RN, BSN Entered By: on 03/10/2020 09:16:56 -------------------------------------------------------------------------------- Encounter Discharge Information Details Patient Name: Date of Service: , [name] 03/10/2020 8:45 A M Medical Record Number: Patient Account Number: Date of Birth/Sex: Treating RN: [birth year] (55 y.o. Primary Care [name]:  Nurse, adult Other Clinician: Referring Apollo Timothy: Treating Kvon [name]/Extender: in Treatment: 23 Encounter Discharge Information Items Discharge Condition: Stable Ambulatory Status: Wheelchair Discharge Destination: Home Transportation: Private Auto Accompanied By: wife and interpreter Schedule Follow-up Appointment: No Clinical Summary of Care: Electronic Signature(s) Signed: 03/10/2020 5:42:38 PM By: Entered By: 03/12/2020 on 03/10/2020 09:36:04 -------------------------------------------------------------------------------- Lower Extremity Assessment Details Patient Name: Date of Service: Zenaida Deed 03/10/2020 8:45 A M Medical Record Number: [name] Patient Account Number: 03/12/2020 Date of Birth/Sex: Treating RN: February 02, 1966 (55 y.o. 12/13/1965 Primary Care Jame Seelig: 46 Other Clinician: Referring Magdala Brahmbhatt: Treating Joyce Heitman/Extender: [name] in Treatment: 23 Edema Assessment Assessed: [name]: No] [name]: No] Edema: [Left: Ye] [Right: s] Calf Left: Right: Point of Measurement: From Medial Instep 36 cm Ankle Left: Right: Point of Measurement: From Medial Instep 22.5 cm Vascular Assessment Pulses: Dorsalis Pedis Palpable: [Left:Yes] Electronic Signature(s) Signed: 03/10/2020 5:52:16 PM By: 03/12/2020 RN, BSN Entered By: Shawn Stall on 03/10/2020 09:07:10 -------------------------------------------------------------------------------- Multi Wound Chart Details Patient Name: Date of Service: 03/12/2020, [name] 03/10/2020 8:45 A M Medical Record Number: 03/12/2020 Patient Account Number: [id number] Date of Birth/Sex: Treating RN: 1965/06/30 (55 y.o. 44 Primary Care Shavontae Gibeault: [name] Other Clinician: Referring Dameer Speiser: Treating Cashlynn Yearwood/Extender: [name] in Treatment: 23 Vital  Signs Height(in): 68 Pulse(bpm): 67 Weight(lbs): 235 Blood Pressure(mmHg): 99/65 Body Mass Index(BMI): 36 Temperature(F): 98.2 Respiratory Rate(breaths/min): 18 Photos: [3R:No Photos Left Metatarsal head fifth] [N/A:N/A N/A] Wound Location: [3R:Gradually Appeared] [N/A:N/A] Wounding Event: [3R:Pressure Ulcer] [N/A:N/A] Primary Etiology: [3R:08/12/2019] [N/A:N/A] Date Acquired: [3R:23] [N/A:N/A] Weeks of Treatment: [3R:Open] [N/A:N/A] Wound Status: [3R:Yes] [N/A:N/A] Wound Recurrence: [3R:0x0x0] [N/A:N/A] Measurements L x W x D (cm) [3R:0] [N/A:N/A] A (cm) : rea [3R:0] [N/A:N/A] Volume (cm) : [3R:100.00%] [N/A:N/A] % Reduction  in A rea: [3R:100.00%] [N/A:N/A] % 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 [name] Primary Care Celestino Ackerman: [name] Other Clinician: Referring Chesnee Floren: Treating Markela Wee/Extender: [name] in Treatment: 23 Active Inactive Electronic Signature(s) Signed: 03/10/2020 5:52:16 PM By: Zenaida Deed RN, BSN Entered By: Zenaida Deed on 03/10/2020 09:08:40 -------------------------------------------------------------------------------- Pain Assessment Details Patient Name: Date of Service: [name], [name] 03/10/2020 8:45 A M Medical Record Number: [id number] Patient Account Number: [id number] Date of Birth/Sex: Treating RN: 1965-10-20 (55 y.o. [name] Primary Care  Oaklee Sunga: [name] Other Clinician: Referring Salar Molden: Treating Trever Streater/Extender: [name] in Treatment: 23 Active Problems Location of Pain Severity and Description of Pain Patient Has Paino No Site Locations Pain Management and Medication Current Pain Management: Electronic Signature(s) Signed: 03/10/2020 10:25:52 AM By: Karl Ito Signed: 03/10/2020 5:52:16 PM By: Zenaida Deed RN, BSN Entered By: Karl Ito on 03/10/2020 08:46:46 -------------------------------------------------------------------------------- Patient/Caregiver Education Details Patient Name: Date of Service: [name] 1/28/2022andnbsp8:45 A M Medical Record Number: [id number] Patient Account Number: [id number] Date of Birth/Gender: Treating RN: 1965-10-22 (55 y.o. [name] Primary Care Physician: [name] Other Clinician: Referring Physician: Treating Physician/Extender: [name] in Treatment: 23 Education Assessment Education Provided To: Patient Education Topics Provided Pressure: Methods: Explain/Verbal Responses: Reinforcements needed, State content correctly Venous: Methods: Explain/Verbal Responses: Reinforcements needed, State content correctly Wound/Skin Impairment: Methods: Explain/Verbal Responses: Reinforcements needed, State content correctly Electronic Signature(s) Signed: 03/10/2020 5:52:16 PM By: Zenaida Deed RN, BSN Entered By: Zenaida Deed on 03/10/2020 09:09:09 -------------------------------------------------------------------------------- Wound Assessment Details Patient Name: Date of Service: [name], [name] 03/10/2020 8:45 A M Medical Record Number: [id number] Patient Account Number: [id number] Date of Birth/Sex: Treating RN: 09-May-1965 (55 y.o. [name] Primary Care Zamire Whitehurst: [name] Other Clinician: Referring Suhas Estis: Treating  Haelee Bolen/Extender: [name] in Treatment: 23 Wound Status Wound Number: [id number] Primary Etiology: Pressure Ulcer Wound Location: Left Metatarsal head fifth Wound Status: Open Wounding Event: Gradually Appeared Date Acquired: 08/12/2019 Weeks Of Treatment: 23 Clustered Wound: No Wound Measurements Length: (cm) Width: (cm) Depth: (cm) Area: (cm) Volume: (cm) 0 % Reduction in Area: 100% 0 % Reduction in Volume: 100% 0 0 0 Wound Description Classification: Category/Stage IV Electronic Signature(s) Signed: 03/10/2020 10:25:52 AM By: Karl Ito Signed: 03/10/2020 5:52:16 PM By: Zenaida Deed RN, BSN Entered By: Karl Ito on 03/10/2020 08:52:27 -------------------------------------------------------------------------------- Vitals Details Patient Name: Date of Service: [name], [name] 03/10/2020 8:45 A M Medical Record Number: [id number] Patient Account Number: [id number] Date of Birth/Sex: Treating RN: 1965-10-03 (55 y.o. [name] Primary Care Serene Kopf: [name] Other Clinician: Referring Cordon Gassett: Treating Naasir Carreira/Extender: [name] in Treatment: 23 Vital Signs Time Taken: 08:46 Temperature (F): 98.2 Height (in): 68 Pulse (bpm): 67 Weight (lbs): 235 Respiratory Rate (breaths/min): 18 Body Mass Index (BMI): 35.7 Blood Pressure (mmHg): 99/65 Reference Range: 80 - 120 mg / dl Electronic Signature(s) Signed: 03/10/2020 10:25:52 AM By: Karl Ito Entered By: Karl Ito on 03/10/2020 08:46:39

## 2020-03-10 NOTE — Progress Notes (Signed)
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 Quantity: 1 Electronic Signature([name]: 03/10/2020 5:33:44 PM By: Linton Ham MD Entered By: Linton Ham on 03/10/2020 09:23:38

## 2020-04-26 ENCOUNTER — Emergency Department (HOSPITAL_COMMUNITY): Payer: Medicaid Other

## 2020-04-26 ENCOUNTER — Other Ambulatory Visit: Payer: Self-pay

## 2020-04-26 ENCOUNTER — Encounter
Payer: Medicaid Other | Attending: Physical Medicine and Rehabilitation | Admitting: Physical Medicine and Rehabilitation

## 2020-04-26 ENCOUNTER — Encounter (HOSPITAL_COMMUNITY): Payer: Self-pay | Admitting: Emergency Medicine

## 2020-04-26 ENCOUNTER — Encounter: Payer: Self-pay | Admitting: Physical Medicine and Rehabilitation

## 2020-04-26 ENCOUNTER — Observation Stay (HOSPITAL_COMMUNITY)
Admission: EM | Admit: 2020-04-26 | Discharge: 2020-04-27 | Disposition: A | Discharge status: Home / Self Care | Payer: Medicaid Other | Attending: Emergency Medicine | Admitting: Emergency Medicine

## 2020-04-26 VITALS — BP 109/70 | HR 60 | Ht 70.0 in | Wt 250.4 lb

## 2020-04-26 DIAGNOSIS — Z20822 Contact with and (suspected) exposure to covid-19: Secondary | ICD-10-CM | POA: Diagnosis not present

## 2020-04-26 DIAGNOSIS — B9629 Other Escherichia coli [E. coli] as the cause of diseases classified elsewhere: Secondary | ICD-10-CM | POA: Diagnosis present

## 2020-04-26 DIAGNOSIS — J189 Pneumonia, unspecified organism: Secondary | ICD-10-CM | POA: Diagnosis not present

## 2020-04-26 DIAGNOSIS — N319 Neuromuscular dysfunction of bladder, unspecified: Secondary | ICD-10-CM | POA: Diagnosis not present

## 2020-04-26 DIAGNOSIS — Z86718 Personal history of other venous thrombosis and embolism: Secondary | ICD-10-CM

## 2020-04-26 DIAGNOSIS — B9689 Other specified bacterial agents as the cause of diseases classified elsewhere: Secondary | ICD-10-CM | POA: Diagnosis not present

## 2020-04-26 DIAGNOSIS — Z7984 Long term (current) use of oral hypoglycemic drugs: Secondary | ICD-10-CM | POA: Diagnosis not present

## 2020-04-26 DIAGNOSIS — L8990 Pressure ulcer of unspecified site, unspecified stage: Secondary | ICD-10-CM

## 2020-04-26 DIAGNOSIS — R2243 Localized swelling, mass and lump, lower limb, bilateral: Secondary | ICD-10-CM | POA: Insufficient documentation

## 2020-04-26 DIAGNOSIS — Z7901 Long term (current) use of anticoagulants: Secondary | ICD-10-CM | POA: Diagnosis not present

## 2020-04-26 DIAGNOSIS — S24101A Unspecified injury at T1 level of thoracic spinal cord, initial encounter: Secondary | ICD-10-CM | POA: Diagnosis present

## 2020-04-26 DIAGNOSIS — G8250 Quadriplegia, unspecified: Secondary | ICD-10-CM | POA: Diagnosis present

## 2020-04-26 DIAGNOSIS — G9341 Metabolic encephalopathy: Secondary | ICD-10-CM | POA: Diagnosis not present

## 2020-04-26 DIAGNOSIS — Z993 Dependence on wheelchair: Secondary | ICD-10-CM | POA: Insufficient documentation

## 2020-04-26 DIAGNOSIS — J181 Lobar pneumonia, unspecified organism: Secondary | ICD-10-CM | POA: Diagnosis not present

## 2020-04-26 DIAGNOSIS — A419 Sepsis, unspecified organism: Secondary | ICD-10-CM

## 2020-04-26 DIAGNOSIS — R5383 Other fatigue: Secondary | ICD-10-CM | POA: Diagnosis present

## 2020-04-26 DIAGNOSIS — N390 Urinary tract infection, site not specified: Secondary | ICD-10-CM | POA: Insufficient documentation

## 2020-04-26 DIAGNOSIS — E119 Type 2 diabetes mellitus without complications: Secondary | ICD-10-CM | POA: Insufficient documentation

## 2020-04-26 DIAGNOSIS — Z95828 Presence of other vascular implants and grafts: Secondary | ICD-10-CM | POA: Diagnosis not present

## 2020-04-26 DIAGNOSIS — R600 Localized edema: Secondary | ICD-10-CM | POA: Diagnosis not present

## 2020-04-26 DIAGNOSIS — L97529 Non-pressure chronic ulcer of other part of left foot with unspecified severity: Secondary | ICD-10-CM | POA: Insufficient documentation

## 2020-04-26 DIAGNOSIS — K592 Neurogenic bowel, not elsewhere classified: Secondary | ICD-10-CM | POA: Diagnosis present

## 2020-04-26 DIAGNOSIS — Z79899 Other long term (current) drug therapy: Secondary | ICD-10-CM | POA: Diagnosis not present

## 2020-04-26 LAB — RESP PANEL BY RT-PCR (FLU A&B, COVID) ARPGX2
Influenza A by PCR: NEGATIVE
Influenza B by PCR: NEGATIVE
SARS Coronavirus 2 by RT PCR: NEGATIVE

## 2020-04-26 LAB — URINALYSIS, ROUTINE W REFLEX MICROSCOPIC
Bilirubin Urine: NEGATIVE
Glucose, UA: 150 mg/dL — AB
Hgb urine dipstick: NEGATIVE
Ketones, ur: NEGATIVE mg/dL
Nitrite: NEGATIVE
Protein, ur: NEGATIVE mg/dL
Specific Gravity, Urine: 1.024 (ref 1.005–1.030)
pH: 7 (ref 5.0–8.0)

## 2020-04-26 LAB — CBC WITH DIFFERENTIAL/PLATELET
Abs Immature Granulocytes: 0.04 10*3/uL (ref 0.00–0.07)
Basophils Absolute: 0 10*3/uL (ref 0.0–0.1)
Basophils Relative: 0 %
Eosinophils Absolute: 0.1 10*3/uL (ref 0.0–0.5)
Eosinophils Relative: 2 %
HCT: 41.9 % (ref 39.0–52.0)
Hemoglobin: 14.1 g/dL (ref 13.0–17.0)
Immature Granulocytes: 1 %
Lymphocytes Relative: 19 %
Lymphs Abs: 1.1 10*3/uL (ref 0.7–4.0)
MCH: 30.9 pg (ref 26.0–34.0)
MCHC: 33.7 g/dL (ref 30.0–36.0)
MCV: 91.9 fL (ref 80.0–100.0)
Monocytes Absolute: 0.3 10*3/uL (ref 0.1–1.0)
Monocytes Relative: 6 %
Neutro Abs: 4.2 10*3/uL (ref 1.7–7.7)
Neutrophils Relative %: 72 %
Platelets: 219 10*3/uL (ref 150–400)
RBC: 4.56 MIL/uL (ref 4.22–5.81)
RDW: 13.5 % (ref 11.5–15.5)
WBC: 5.8 10*3/uL (ref 4.0–10.5)
nRBC: 0 % (ref 0.0–0.2)

## 2020-04-26 LAB — COMPREHENSIVE METABOLIC PANEL
ALT: 24 U/L (ref 0–44)
AST: 23 U/L (ref 15–41)
Albumin: 3.3 g/dL — ABNORMAL LOW (ref 3.5–5.0)
Alkaline Phosphatase: 71 U/L (ref 38–126)
Anion gap: 8 (ref 5–15)
BUN: 5 mg/dL — ABNORMAL LOW (ref 6–20)
CO2: 26 mmol/L (ref 22–32)
Calcium: 8.8 mg/dL — ABNORMAL LOW (ref 8.9–10.3)
Chloride: 105 mmol/L (ref 98–111)
Creatinine, Ser: 0.6 mg/dL — ABNORMAL LOW (ref 0.61–1.24)
GFR, Estimated: >60 mL/min (ref >60)
Glucose, Bld: 319 mg/dL — ABNORMAL HIGH (ref 70–99)
Potassium: 3.6 mmol/L (ref 3.5–5.1)
Sodium: 139 mmol/L (ref 135–145)
Total Bilirubin: 0.7 mg/dL (ref 0.3–1.2)
Total Protein: 6.8 g/dL (ref 6.5–8.1)

## 2020-04-26 LAB — SALICYLATE LEVEL: Salicylate Lvl: <7 mg/dL — ABNORMAL LOW (ref 7.0–30.0)

## 2020-04-26 LAB — HEMOGLOBIN A1C
Hgb A1c MFr Bld: 8.2 % — ABNORMAL HIGH (ref 4.8–5.6)
Mean Plasma Glucose: 188.64 mg/dL

## 2020-04-26 LAB — AMMONIA: Ammonia: 29 umol/L (ref 9–35)

## 2020-04-26 LAB — PROTIME-INR
INR: 1.2 (ref 0.8–1.2)
Prothrombin Time: 14.4 seconds (ref 11.4–15.2)

## 2020-04-26 LAB — PROCALCITONIN: Procalcitonin: <0.1 ng/mL

## 2020-04-26 LAB — LACTIC ACID, PLASMA: Lactic Acid, Venous: 2.6 mmol/L (ref 0.5–1.9)

## 2020-04-26 LAB — CBG MONITORING, ED: Glucose-Capillary: 214 mg/dL — ABNORMAL HIGH (ref 70–99)

## 2020-04-26 LAB — BRAIN NATRIURETIC PEPTIDE: B Natriuretic Peptide: 47.5 pg/mL (ref 0.0–100.0)

## 2020-04-26 MED ORDER — SODIUM CHLORIDE 0.9 % IV SOLN
2.0000 g | INTRAVENOUS | Status: DC
  Administered 2020-04-27: 2 g via INTRAVENOUS
  Filled 2020-04-26: qty 2

## 2020-04-26 MED ORDER — SODIUM CHLORIDE 0.9 % IV SOLN
500.0000 mg | Freq: Once | INTRAVENOUS | Status: AC
  Administered 2020-04-26: 500 mg via INTRAVENOUS
  Filled 2020-04-26: qty 500

## 2020-04-26 MED ORDER — INSULIN ASPART 100 UNIT/ML ~~LOC~~ SOLN
0.0000 [IU] | SUBCUTANEOUS | Status: DC
  Administered 2020-04-26: 3 [IU] via SUBCUTANEOUS
  Administered 2020-04-27 (×2): 2 [IU] via SUBCUTANEOUS
  Administered 2020-04-27: 3 [IU] via SUBCUTANEOUS
  Administered 2020-04-27: 5 [IU] via SUBCUTANEOUS

## 2020-04-26 MED ORDER — APIXABAN 5 MG PO TABS: 5.0000 mg | ORAL_TABLET | Freq: Two times a day (BID) | ORAL | 3 refills | Status: DC

## 2020-04-26 MED ORDER — DOCUSATE SODIUM 100 MG PO CAPS: 100.0000 mg | ORAL_CAPSULE | Freq: Every day | ORAL | Status: DC | PRN

## 2020-04-26 MED ORDER — BACLOFEN 10 MG PO TABS
10.0000 mg | ORAL_TABLET | Freq: Three times a day (TID) | ORAL | Status: DC
  Administered 2020-04-27 (×3): 10 mg via ORAL
  Filled 2020-04-26 (×3): qty 1

## 2020-04-26 MED ORDER — HYDROXYZINE HCL 25 MG PO TABS: 25.0000 mg | ORAL_TABLET | Freq: Three times a day (TID) | ORAL | Status: DC | PRN

## 2020-04-26 MED ORDER — BISACODYL 10 MG RE SUPP: 10.0000 mg | Freq: Every day | RECTAL | Status: DC

## 2020-04-26 MED ORDER — FLUOXETINE HCL 20 MG PO CAPS
20.0000 mg | ORAL_CAPSULE | Freq: Every day | ORAL | Status: DC
  Administered 2020-04-27: 20 mg via ORAL
  Filled 2020-04-26: qty 1

## 2020-04-26 MED ORDER — SODIUM CHLORIDE 0.9 % IV SOLN
500.0000 mg | INTRAVENOUS | Status: DC
  Administered 2020-04-27: 500 mg via INTRAVENOUS
  Filled 2020-04-26: qty 500

## 2020-04-26 MED ORDER — APIXABAN 5 MG PO TABS
5.0000 mg | ORAL_TABLET | Freq: Two times a day (BID) | ORAL | Status: DC
  Administered 2020-04-27 (×2): 5 mg via ORAL
  Filled 2020-04-26 (×2): qty 1

## 2020-04-26 MED ORDER — FLUOXETINE HCL 20 MG PO CAPS: 20.0000 mg | ORAL_CAPSULE | Freq: Every day | ORAL | 3 refills | Status: DC

## 2020-04-26 MED ORDER — SODIUM CHLORIDE 0.9 % IV SOLN
1.0000 g | Freq: Once | INTRAVENOUS | Status: AC
  Administered 2020-04-26: 1 g via INTRAVENOUS
  Filled 2020-04-26: qty 10

## 2020-04-26 MED ORDER — SODIUM CHLORIDE 0.9 % IV BOLUS
1000.0000 mL | Freq: Once | INTRAVENOUS | Status: AC
  Administered 2020-04-26: 1000 mL via INTRAVENOUS

## 2020-04-26 MED ORDER — GABAPENTIN 300 MG PO CAPS
600.0000 mg | ORAL_CAPSULE | Freq: Three times a day (TID) | ORAL | Status: DC
  Administered 2020-04-27 (×3): 600 mg via ORAL
  Filled 2020-04-26 (×4): qty 2

## 2020-04-26 MED ORDER — SODIUM CHLORIDE 0.9 % IV SOLN: 1.0000 g | INTRAVENOUS | Status: DC

## 2020-04-26 NOTE — ED Provider Notes (Signed)
  Physical Exam  BP 126/81   Pulse 60   Temp 97.7 F (36.5 C)   Resp 18   Ht 5\' 10"  (1.778 m)   Wt 113.4 kg   SpO2 98%   BMI 35.87 kg/m   Physical Exam  ED Course/Procedures     Procedures  MDM  Care assumed at 3 PM.  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  Patient is here with altered mental status and confusion.  There was some concern for sepsis.  Patient has some erythema bilateral thighs and he has stage II decub ulcer in the sacral area.  Sepsis work-up initiated and anticipate admission after labs return  7:13 PM Patient's white blood cell count is normal and his lactate is 2.6.  Patient had a cath UA that showed UTI and also possible pneumonia chest x-ray.  Given Rocephin and azithromycin IV fluids.  CT head did not show any bleed.  Hospitalist to admit for encephalopathy from sepsis from UTI and pneumonia  CRITICAL CARE Performed by:   Total critical care time: 30 minutes  Critical care time was exclusive of separately billable procedures and treating other patients.  Critical care was necessary to treat or prevent imminent or life-threatening deterioration.  Critical care was time spent personally by me on the following activities: development of treatment plan with patient and/or surrogate as well as nursing, discussions with consultants, evaluation of patient's response to treatment, examination of patient, obtaining history from patient or surrogate, ordering and performing treatments and interventions, ordering and review of laboratory studies, ordering and review of radiographic studies, pulse oximetry and re-evaluation of patient's condition.        Richardean Canal, MD 04/26/20 [phone]

## 2020-04-26 NOTE — ED Provider Notes (Signed)
[facility] EMERGENCY DEPARTMENT Provider Note   CSN: [id number] Arrival date & time: 04/26/20  1220     History Chief Complaint  Patient presents with  . Fatigue  . Skin Problem  . Edema    [name] is a 55 y.o. male.  Patient with history of wheelchair dependence, neurogenic bladder, spinal cord injury T1, cellulitis, spasticity history presents with general fatigue and sleepiness for the past 24 hours.  Patient has history of DVT on Eliquis and has filter per report.  No fever reported.  Significant other feels wounds are similar to previous.  No new order.  Translator used for this discussion.  Patient has no headache fever or vomiting.        Past Medical History:  Diagnosis Date  . Diabetes mellitus without complication ([place])   . History of cervical fracture     Patient Active Problem List   Diagnosis Date Noted  . At risk for unstable body temperature 11/26/2019  . Wheelchair dependence 11/26/2019  . Pressure ulcer 09/22/2019  . Cellulitis 09/22/2019  . Hypokalemia 09/22/2019  . Spasticity 03/08/2019  . Erectile dysfunction due to diseases classified elsewhere 03/08/2019  . 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 Neurogenic bowel 01/22/2019  . Neurogenic bladder 01/22/2019  . Myofascial pain 01/22/2019  . Spinal cord injury at T1-T6 level ([place]) 12/30/2018  . Quadriplegia ([place]) 12/30/2018    Past Surgical History:  Procedure Laterality Date  . CERVICAL SPINE SURGERY         Family History  Problem Relation Age of Onset  . Diabetes Mother   . Diabetes Father     Social History   Tobacco Use  . Smoking status: Never Smoker  . Smokeless tobacco: Never Used  Substance Use Topics  . Alcohol use: Yes    Comment: social  . Drug use: No    Home Medications Prior to Admission medications   Medication Sig Start Date End Date Taking? Authorizing Provider  acetaminophen (TYLENOL) 500 MG tablet Take 500 mg by mouth every 6 (six)  hours as needed.    [provider]  apixaban (ELIQUIS) 5 MG TABS tablet Take 1 tablet (5 mg total) by mouth 2 (two) times daily. 04/26/20   Lovorn, Jinny Blossom, MD  baclofen (LIORESAL) 10 MG tablet Take 1 tablet (10 mg total) by mouth 3 (three) times daily. For muscle spasms/tightness 10/13/19   Lovorn, Jinny Blossom, MD  BISACODYL LAXATIVE RE Place 1 suppository rectally at bedtime.    [provider]  blood glucose meter kit and supplies Dispense based on patient and insurance preference. Use up to four times daily as directed. (FOR ICD-10 E10.9, E11.9). 05/27/19   Kerin Perna, NP  Carboxymethylcellulose Sodium (ARTIFICIAL TEARS OP) Apply 1-2 drops to eye daily as needed (dry eyes).     [provider]  docusate sodium (COLACE) 100 MG capsule Take 1 capsule (100 mg total) by mouth daily as needed. 09/25/19 09/24/20  Lavina Hamman, MD  FLUoxetine (PROZAC) 20 MG capsule Take 1 capsule (20 mg total) by mouth daily. Needs appt for refills 04/26/20   Lovorn, Jinny Blossom, MD  furosemide (LASIX) 40 MG tablet Take 1 tablet daily for 6 days, take as needed after that for swelling or weight gain 09/25/19   Lavina Hamman, MD  gabapentin (NEURONTIN) 600 MG tablet Take 1 tablet (600 mg total) by mouth 3 (three) times daily. 10/13/19   Lovorn, Jinny Blossom, MD  glipiZIDE (GLUCOTROL) 10 MG tablet Take 10  mg by mouth 2 (two) times daily. 04/08/20   [provider]  hydrOXYzine (ATARAX/VISTARIL) 25 MG tablet Take 1 tablet (25 mg total) by mouth 3 (three) times daily as needed for itching. 09/25/19   Lavina Hamman, MD  povidone-iodine 10 % swab Apply 1 application topically as needed. Patient not taking: Reported on 04/26/2020 09/25/19   Lavina Hamman, MD  sildenafil (VIAGRA) 100 MG tablet Take 1 tablet (100 mg total) by mouth daily as needed for erectile dysfunction. 10/13/19   Lovorn, Jinny Blossom, MD  simethicone (MYLICON) 80 MG chewable tablet Chew 1 tablet (80 mg total) by mouth every 6 (six) hours as needed.  05/27/19   Kerin Perna, NP  sitaGLIPtin-metformin (JANUMET) 50-1000 MG tablet Take 1 tablet by mouth 2 (two) times daily with a meal. 05/27/19   Kerin Perna, NP    Allergies    Patient has no known allergies.  Review of Systems   Review of Systems  Unable to perform ROS: Mental status change    Physical Exam Updated Vital Signs BP 134/64   Pulse 65   Temp 97.7 F (36.5 C)   Resp 19   Ht _0  (1.778 m)   Wt 113.4 kg   SpO2 91%   BMI 35.87 kg/m   Physical Exam Vitals and nursing note reviewed.  HENT:     Head: Normocephalic and atraumatic.     Nose: No congestion.     Mouth/Throat:     Mouth: Mucous membranes are dry.  Cardiovascular:     Rate and Rhythm: Normal rate.  Pulmonary:     Effort: Pulmonary effort is normal.     Breath sounds: Rhonchi: body habitus makes difficult exam and on chair.  Abdominal:     General: There is no distension.     Tenderness: There is no abdominal tenderness.  Musculoskeletal:        General: Swelling (bilateral LEs) present.     Cervical back: No rigidity.  Skin:    Capillary Refill: Capillary refill takes 2 to 3 seconds.     Findings: Rash present.     Comments: Skin wound superficial ulcer planter left lateral mid foot, no drainage or odor, no surrounding erythema. Mild erythema left proximal thigh no induration or fluctuance.  Neurological:     Mental Status: He is alert.     Comments: Patient has no sensation chest down to lower extremities bilateral.  Baseline from spinal cord injury.  Flaccid paralysis lower extremities.  Pupils equal bilateral, sleepy however wakes up and answers questions to loud verbal discussion and Patent attorney.  Psychiatric:     Comments: Sleepy     ED Results / Procedures / Treatments   Labs (all labs ordered are listed, but only abnormal results are displayed) Labs Reviewed  COMPREHENSIVE METABOLIC PANEL - Abnormal; Notable for the following components:      Result Value    Glucose, Bld 319 (*)    BUN 5 (*)    Creatinine, Ser 0.60 (*)    Calcium 8.8 (*)    Albumin 3.3 (*)    All other components within normal limits  LACTIC ACID, PLASMA - Abnormal; Notable for the following components:   Lactic Acid, Venous 2.6 (*)    All other components within normal limits  SALICYLATE LEVEL - Abnormal; Notable for the following components:   Salicylate Lvl <7.9 (*)    All other components within normal limits  CULTURE, BLOOD (ROUTINE X 2)  CULTURE,  BLOOD (ROUTINE X 2)  PROTIME-INR  AMMONIA  LACTIC ACID, PLASMA  CBC WITH DIFFERENTIAL/PLATELET  URINALYSIS, ROUTINE W REFLEX MICROSCOPIC  CBC WITH DIFFERENTIAL/PLATELET  PROTIME-INR    EKG EKG Interpretation  Date/Time:  Wednesday April 26 2020 12:38:23 EDT Ventricular Rate:  67 PR Interval:  220 QRS Duration: 98 QT Interval:  400 QTC Calculation: 422 R Axis:   8 Text Interpretation: Sinus rhythm with 1st degree A-V block Nonspecific T wave abnormality Abnormal ECG No significant change since last tracing Confirmed by Wandra Arthurs [phone]) on 04/26/2020 3:21:18 PM   Radiology DG Chest 2 View  Result Date: 04/26/2020 CLINICAL DATA:  Fatigue EXAM: CHEST - 2 VIEW COMPARISON:  October 04, 2004 FINDINGS: Subtle airspace opacity noted in the medial left base. There is slight bibasilar atelectasis. Lungs elsewhere clear. Heart is upper normal in size with pulmonary vascularity normal. No adenopathy appreciable. Postoperative change noted in the cervical and upper to midthoracic regions. IMPRESSION: Subtle ill-defined opacity left base concerning for early pneumonia. Mild bibasilar atelectasis. Lungs otherwise clear. Heart upper normal in size. Extensive cervical and thoracic postoperative changes. Electronically Signed   By: Lowella Grip III M.D.   On: 04/26/2020 13:50    Procedures Procedures   Medications Ordered in ED Medications  cefTRIAXone (ROCEPHIN) 1 g in sodium chloride 0.9 % 100 mL IVPB (has no  administration in time range)  azithromycin (ZITHROMAX) 500 mg in sodium chloride 0.9 % 250 mL IVPB (has no administration in time range)  sodium chloride 0.9 % bolus 1,000 mL (has no administration in time range)    ED Course  I have reviewed the triage vital signs and the nursing notes.  Pertinent labs & imaging results that were available during my care of the patient were reviewed by me and considered in my medical decision making (see chart for details).    MDM Rules/Calculators/A&P                          Patient with paralysis history and wheelchair/chair bound presents with general fatigue and sleepiness.  Differential very broad including infectious from urine versus ulcers versus pneumonia versus cellulitis versus other.  Patient will need significant assistance to move from chair to the bed.  General blood work ordered and blood cultures.  Antibiotics ordered for possible pneumonia or urine infection.  Blood work reviewed showing normal creatinine, lactic acid 2, glucose 300.  CBC and urinalysis pending.  EKG reviewed normal sinus rhythm.  Chest x-ray possible pneumonia.  Patient CARE signed out to Dr. To follow-up results and plan for admission. Ammonia pending.  Final Clinical Impression(s) / ED Diagnoses Final diagnoses:  Skin ulcer of plantar aspect of left foot, unspecified ulcer stage (HCC)  Fatigue, unspecified type  Bilateral leg edema    Rx / DC Orders ED Discharge Orders    None       Elnora Morrison, MD 04/26/20 1534

## 2020-04-26 NOTE — ED Notes (Signed)
Patient transported to CT 

## 2020-04-26 NOTE — ED Notes (Signed)
Pt back from CT

## 2020-04-26 NOTE — Progress Notes (Signed)
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   Started getting more sleepy yesterday-  Urine is very dark- and very little.  Has foul smell.  Does in/out caths q4 hours-   Pt thinks he's sick- because cannot wake up.  Also has LEs edema that's MUCH worse-   Other day was falling to the side as well in w/c.     Exam: Sleepy- hard to wake up- accompanied by wife, in power w/c- would wake up a few seconds, and then fall back asleep, interpretor in room, no ACUTe distress Doesn't have foley-  Mild to moderate redness/erythema in L upper inner thigh- not circumscribed- cool to touch-  Legs 3-4+ pitting edema.    Plan: 1. Will send pt to ER- since literally is usually an active talking man- but he CANNOT wake up. - NEEDS to check Urinalysis AND Urine Culture. I would prefer he go to [facility]-    2. Also  Needs to look at  L thigh- cool to touch, but more swollen and erythematous and legs are both much more swollen than normal-  On Eliquis due to having DVTs in LLE- but RLE is also more swollen.   3. Concerned that pt might be forming a blood infection-possibly,  due to severe sedation- doesn't have fever, but needs to have CBC/with diff, and CMP ASAP, please- and whatever else ER doctor feels is appropriate- he cannot FEEL below chest- is a C7 quadriplegia complete SCI.   4. Cannot do Trigger point injections since could spread an infection at this time.   5. F/U in 1 month for trigger point injections.  6. Might be able to do compression on LEs but needs to see ED/ER before to make sure no more DVTs/clots first.  Hx of IVC filter placement-still has it.    7. Doesn't need f/u with Surgeon in [place]- won't change his function.     I spent a total of 20 minutes on visit- as detailed above.

## 2020-04-26 NOTE — ED Provider Notes (Signed)
Ultrasound ED Peripheral IV (Provider)  Date/Time: 04/26/2020 4:26 PM Performed by: Gailen Shelter, PA Authorized by: Gailen Shelter, PA   Procedure details:    Indications: hydration, multiple failed IV attempts and poor IV access     Skin Prep: chlorhexidine gluconate     Location:  Right AC   Angiocath:  20 G   Bedside Ultrasound Guided: Yes     Images: not archived     Patient tolerated procedure without complications: Yes     Dressing applied: Yes        Gailen Shelter, PA 04/26/20 1626    Charlynne Pander, MD 04/28/20 1458

## 2020-04-26 NOTE — Patient Instructions (Signed)
Plan: 1. Will send pt to ER- since literally is usually an active talking man- but he CANNOT wake up. - NEEDS to check Urinalysis AND Urine Culture. I would prefer he go to [facility]-    2. Also  Needs to look at  L thigh- cool to touch, but more swollen and erythematous and legs are both much more swollen than normal-  On Eliquis due to having DVTs in LLE- but RLE is also more swollen.   3. Concerned that pt might be forming a blood infection-possibly,  due to severe sedation- doesn't have fever, but needs to have CBC/with diff, and CMP ASAP, please- and whatever else ER doctor feels is appropriate- he cannot FEEL below chest- is a C7 quadriplegia complete SCI.   4. Cannot do Trigger point injections since could spread an infection at this time.   5. F/U in 1 month for trigger point injections.  6. Might be able to do compression on LEs but needs to see ED/ER before to make sure no more DVTs/clots first.  Hx of IVC filter placement-still has it.    7. Doesn't need f/u with Surgeon in [place]- won't change his function.   8. Don't come to follow up with me, if still sick.

## 2020-04-26 NOTE — ED Triage Notes (Signed)
Arrived with wife patient seen doctor today sent to the ED for evaluation for fatigue wife having trouble waking patient onset one day ago. History of DVT on Eliquis and is a C7 quadriplegia complete SCI. Also has increased bilateral lower extremity edema more then normal and has erythematous. Patient awake answering and following commands appropriate when awake.

## 2020-04-26 NOTE — H&P (Signed)
History and Physical    [name] MRN:[id number] DOB: 09/03/1965 DOA: 04/26/2020  PCP: [facility]  Patient coming from: Home  I have personally briefly reviewed patient's old medical records in  Link  Chief Complaint: AMS  HPI: [name] is a 55 y.o. 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  Self caths Q4H at baseline.  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  Pt presents to ED with generalized fatigue, lethargy for past 24h.  No fever reported.  Wounds (sacral and heel decubitus) are unchanged and similar to baseline.  Does have dark urine and reduced UOP with foul smelling urine.  No abd nor flank pain as pt doesn't have sensation below the C7 level (chronically).    No headache, no fever, no vomiting.   ED Course: UA with small LE, 6-10 WBC, many bacteria.  COVID neg  CXR showing possible early LLL PNA.  WBC nl.  Pt started on empiric rocephin + azithromycin.   Review of Systems: As per HPI, otherwise all review of systems negative.  Past Medical History:  Diagnosis Date  . Diabetes mellitus without complication (HCC)   . History of cervical fracture     Past Surgical History:  Procedure Laterality Date  . CERVICAL SPINE SURGERY       reports that he has never smoked. He has never used smokeless tobacco. He reports current alcohol use. He reports that he does not use drugs.  No Known Allergies  Family History  Problem Relation Age of Onset  . Diabetes Mother   . Diabetes Father      Prior to Admission medications   Medication Sig Start Date End Date Taking? Authorizing Provider  acetaminophen (TYLENOL) 500 MG tablet Take 500 mg by mouth every 6 (six) hours as needed for mild pain.   Yes [provider]  apixaban (ELIQUIS) 5 MG TABS tablet Take 1 tablet (5 mg total) by mouth 2 (two) times daily. 04/26/20   Yes Lovorn, Megan, MD  baclofen (LIORESAL) 10 MG tablet Take 1 tablet (10 mg total) by mouth 3 (three) times daily. For muscle spasms/tightness 10/13/19  Yes Lovorn, Megan, MD  BISACODYL LAXATIVE RE Place 1 suppository rectally at bedtime.   Yes [provider]  blood glucose meter kit and supplies Dispense based on patient and insurance preference. Use up to four times daily as directed. (FOR ICD-10 E10.9, E11.9). 05/27/19  Yes Edwards, Michelle P, NP  Carboxymethylcellulose Sodium (ARTIFICIAL TEARS OP) Apply 1-2 drops to eye daily as needed (dry eyes).    Yes [provider]  docusate sodium (COLACE) 100 MG capsule Take 1 capsule (100 mg total) by mouth daily as needed. Patient taking differently: Take 100 mg by mouth daily as needed for mild constipation. 09/25/19 09/24/20 Yes Patel, Pranav M, MD  FLUoxetine (PROZAC) 20 MG capsule Take 1 capsule (20 mg total) by mouth daily. Needs appt for refills 04/26/20  Yes Lovorn, Megan, MD  furosemide (LASIX) 40 MG tablet Take 1 tablet daily for 6 days, take as needed after that for swelling or weight gain Patient taking differently: Take 40 mg by mouth daily as needed for fluid. 09/25/19  Yes Patel, Pranav M, MD  gabapentin (NEURONTIN) 600 MG tablet Take 1 tablet (600 mg total) by mouth 3 (three) times daily. 10/13/19  Yes Lovorn, Megan, MD  glipiZIDE (GLUCOTROL) 10 MG tablet Take 10   mg by mouth 2 (two) times daily. 04/08/20  Yes [provider]  hydrOXYzine (ATARAX/VISTARIL) 25 MG tablet Take 1 tablet (25 mg total) by mouth 3 (three) times daily as needed for itching. 09/25/19  Yes Patel, Pranav M, MD  sitaGLIPtin-metformin (JANUMET) 50-1000 MG tablet Take 1 tablet by mouth 2 (two) times daily with a meal. 05/27/19  Yes Edwards, Michelle P, NP    Physical Exam: Vitals:   04/26/20 2030 04/26/20 2230 04/26/20 2245 04/26/20 2300  BP: (!) 166/82 112/88 128/79 114/71  Pulse: (!) 55 60 62 60  Resp: 15 11 (!) 9 12  Temp:      SpO2: 97% 95%  94% 95%  Weight:      Height:        Constitutional: NAD, calm, comfortable, mildly sleepy. Eyes: PERRL, lids and conjunctivae normal ENMT: Mucous membranes are dry. Posterior pharynx clear of any exudate or lesions.Normal dentition.  Neck: normal, supple, no masses, no thyromegaly Respiratory: clear to auscultation bilaterally, no wheezing, no crackles. Normal respiratory effort. No accessory muscle use.  Cardiovascular: Regular rate and rhythm, no murmurs / rubs / gallops. No extremity edema. 2+ pedal pulses. No carotid bruits.  Abdomen: no tenderness, no masses palpated. No hepatosplenomegaly. Bowel sounds positive.  Musculoskeletal: no clubbing / cyanosis. No joint deformity upper and lower extremities. Good ROM, no contractures. Normal muscle tone.  Skin: stage 2 sacral decubitus, mild erythema left proximal thigh, superficial ulcer L lateral midfoot without drainage, ulcer, or erythema. Neurologic: Flaccid paralysis BLE. Psychiatric: Normal judgment and insight. Alert and oriented x 3. Normal mood.    Labs on Admission: I have personally reviewed following labs and imaging studies  CBC: Recent Labs  Lab 04/26/20 1500  WBC 5.8  NEUTROABS 4.2  HGB 14.1  HCT 41.9  MCV 91.9  PLT 219   Basic Metabolic Panel: Recent Labs  Lab 04/26/20 1245  NA 139  K 3.6  CL 105  CO2 26  GLUCOSE 319*  BUN 5*  CREATININE 0.60*  CALCIUM 8.8*   GFR: Estimated Creatinine Clearance: 136.3 mL/min (A) (by C-G formula based on SCr of 0.6 mg/dL (L)). Liver Function Tests: Recent Labs  Lab 04/26/20 1245  AST 23  ALT 24  ALKPHOS 71  BILITOT 0.7  PROT 6.8  ALBUMIN 3.3*   No results for input(s): LIPASE, AMYLASE in the last 168 hours. Recent Labs  Lab 04/26/20 1254  AMMONIA 29   Coagulation Profile: Recent Labs  Lab 04/26/20 1245  INR 1.2   Cardiac Enzymes: No results for input(s): CKTOTAL, CKMB, CKMBINDEX, TROPONINI in the last 168 hours. BNP (last 3 results) No results for  input(s): PROBNP in the last 8760 hours. HbA1C: Recent Labs    04/26/20 1928  HGBA1C 8.2*   CBG: Recent Labs  Lab 04/26/20 2007  GLUCAP 214*   Lipid Profile: No results for input(s): CHOL, HDL, LDLCALC, TRIG, CHOLHDL, LDLDIRECT in the last 72 hours. Thyroid Function Tests: No results for input(s): TSH, T4TOTAL, FREET4, T3FREE, THYROIDAB in the last 72 hours. Anemia Panel: No results for input(s): VITAMINB12, FOLATE, FERRITIN, TIBC, IRON, RETICCTPCT in the last 72 hours. Urine analysis:    Component Value Date/Time   COLORURINE YELLOW 04/26/2020 1739   APPEARANCEUR HAZY (A) 04/26/2020 1739   LABSPEC 1.024 04/26/2020 1739   PHURINE 7.0 04/26/2020 1739   GLUCOSEU 150 (A) 04/26/2020 1739   HGBUR NEGATIVE 04/26/2020 1739   BILIRUBINUR NEGATIVE 04/26/2020 1739   KETONESUR NEGATIVE 04/26/2020 1739   PROTEINUR NEGATIVE 04/26/2020   1739   NITRITE NEGATIVE 04/26/2020 1739   LEUKOCYTESUR SMALL (A) 04/26/2020 1739    Radiological Exams on Admission: DG Chest 2 View  Result Date: 04/26/2020 CLINICAL DATA:  Fatigue EXAM: CHEST - 2 VIEW COMPARISON:  October 04, 2004 FINDINGS: Subtle airspace opacity noted in the medial left base. There is slight bibasilar atelectasis. Lungs elsewhere clear. Heart is upper normal in size with pulmonary vascularity normal. No adenopathy appreciable. Postoperative change noted in the cervical and upper to midthoracic regions. IMPRESSION: Subtle ill-defined opacity left base concerning for early pneumonia. Mild bibasilar atelectasis. Lungs otherwise clear. Heart upper normal in size. Extensive cervical and thoracic postoperative changes. Electronically Signed   By: William  Woodruff III M.D.   On: 04/26/2020 13:50   CT Head Wo Contrast  Result Date: 04/26/2020 CLINICAL DATA:  Mental status change, unknown cause. EXAM: CT HEAD WITHOUT CONTRAST TECHNIQUE: Contiguous axial images were obtained from the base of the skull through the vertex without intravenous  contrast. COMPARISON:  Brain MRI 04/24/2015.  Head CT 01/26/2005. FINDINGS: Brain: Cerebral volume is normal for age. There is no acute intracranial hemorrhage. No demarcated cortical infarct. No extra-axial fluid collection. No evidence of intracranial mass. No midline shift. Vascular: No hyperdense vessel.  Atherosclerotic calcifications. Skull: Normal. Negative for fracture or focal lesion. Sinuses/Orbits: Chronic medially displaced fracture deformity of the left lamina papyracea. Visualized orbits show no acute finding. Mild bilateral frontal and ethmoid sinus mucosal thickening. Frothy secretions within the right sphenoid sinus. Mild bilateral maxillary sinus mucosal thickening. IMPRESSION: No evidence of acute intracranial abnormality. Paranasal sinus disease as described. Correlate for acute sinusitis. Electronically Signed   By: Kyle  Golden DO   On: 04/26/2020 18:15    EKG: Independently reviewed.  Assessment/Plan Principal Problem:   Acute metabolic encephalopathy Active Problems:   Spinal cord injury at T1-T6 level (HCC)   Quadriplegia (HCC)   Neurogenic bowel   Neurogenic bladder   Pressure ulcer   Pneumonia   DM2 (diabetes mellitus, type 2) (HCC)   History of DVT (deep vein thrombosis)   Acute lower UTI   Community acquired pneumonia of left lower lobe of lung    1. Acute metabolic encephalopathy - 1. ? Due to PNA 2. CT head neg 3. UA equivocal 2. LLL CAP - 1. PNA pathway 2. Empiric rocephin + azithro 3. COVID neg 4. Odd that he has normal WBC and neg procalcitonin though... 3. Neurogenic bladder - 1. Foley ordered 4. Pressure ulcers - 1. Wound care consult 5. ? UTI - 1. Rocephin for PNA as above 2. UCx pending 6. DM2 - 1. Hold home meds 2. Sensitive SSI Q4H for the moment 7. H/o DVT - 1. Cont eliquis 8. Quadriplegia - 1. Chronic and baseline  DVT prophylaxis: Eliquis Code Status: Full Family Communication: Family at bedisde Disposition Plan: Home after  mental status improved Consults called: None Admission status: Place in obs   GARDNER, JARED M. DO [facility]  How to contact the [facility] Attending or Consulting provider 7A - 7P or covering provider during after hours 7P -7A, for this patient?  1. Check the care team in [facility] and look for a) attending/consulting [facility] provider listed and b) the [facility] team listed 2. Log into [email or web address]  Amion Physician Scheduling and messaging for groups and whole hospitals  On call and physician scheduling software for group practices, residents, hospitalists and other medical providers for call, clinic, rotation and shift schedules. On[employer] is a hospital-wide system for scheduling doctors and   paging doctors on call. EasyPlot is for scientific plotting and data analysis.  [email or web address]  and use [place]'s universal password to access. If you do not have the password, please contact the hospital operator.  3. Locate the [facility] provider you are looking for under [facility] and page to a number that you can be directly reached. 4. If you still have difficulty reaching the provider, please page the [facility] (Director on Call) for the Hospitalists listed on amion for assistance.  04/26/2020, 11:46 PM

## 2020-04-27 ENCOUNTER — Observation Stay (HOSPITAL_BASED_OUTPATIENT_CLINIC_OR_DEPARTMENT_OTHER): Payer: Medicaid Other

## 2020-04-27 DIAGNOSIS — J189 Pneumonia, unspecified organism: Secondary | ICD-10-CM | POA: Diagnosis not present

## 2020-04-27 DIAGNOSIS — K592 Neurogenic bowel, not elsewhere classified: Secondary | ICD-10-CM

## 2020-04-27 DIAGNOSIS — N390 Urinary tract infection, site not specified: Secondary | ICD-10-CM | POA: Diagnosis not present

## 2020-04-27 DIAGNOSIS — R609 Edema, unspecified: Secondary | ICD-10-CM | POA: Diagnosis not present

## 2020-04-27 DIAGNOSIS — G9341 Metabolic encephalopathy: Secondary | ICD-10-CM | POA: Diagnosis not present

## 2020-04-27 DIAGNOSIS — E119 Type 2 diabetes mellitus without complications: Secondary | ICD-10-CM | POA: Diagnosis not present

## 2020-04-27 LAB — CBC
HCT: 40.2 % (ref 39.0–52.0)
Hemoglobin: 13.5 g/dL (ref 13.0–17.0)
MCH: 31 pg (ref 26.0–34.0)
MCHC: 33.6 g/dL (ref 30.0–36.0)
MCV: 92.2 fL (ref 80.0–100.0)
Platelets: 211 10*3/uL (ref 150–400)
RBC: 4.36 MIL/uL (ref 4.22–5.81)
RDW: 13.4 % (ref 11.5–15.5)
WBC: 5.3 10*3/uL (ref 4.0–10.5)
nRBC: 0 % (ref 0.0–0.2)

## 2020-04-27 LAB — BASIC METABOLIC PANEL
Anion gap: 6 (ref 5–15)
BUN: <5 mg/dL — ABNORMAL LOW (ref 6–20)
CO2: 26 mmol/L (ref 22–32)
Calcium: 8.8 mg/dL — ABNORMAL LOW (ref 8.9–10.3)
Chloride: 108 mmol/L (ref 98–111)
Creatinine, Ser: 0.38 mg/dL — ABNORMAL LOW (ref 0.61–1.24)
GFR, Estimated: >60 mL/min (ref >60)
Glucose, Bld: 218 mg/dL — ABNORMAL HIGH (ref 70–99)
Potassium: 3.4 mmol/L — ABNORMAL LOW (ref 3.5–5.1)
Sodium: 140 mmol/L (ref 135–145)

## 2020-04-27 LAB — GLUCOSE, CAPILLARY
Glucose-Capillary: 263 mg/dL — ABNORMAL HIGH (ref 70–99)
Glucose-Capillary: 191 mg/dL — ABNORMAL HIGH (ref 70–99)
Glucose-Capillary: 231 mg/dL — ABNORMAL HIGH (ref 70–99)

## 2020-04-27 LAB — HIV ANTIBODY (ROUTINE TESTING W REFLEX): HIV Screen 4th Generation wRfx: NONREACTIVE

## 2020-04-27 LAB — CBG MONITORING, ED: Glucose-Capillary: 170 mg/dL — ABNORMAL HIGH (ref 70–99)

## 2020-04-27 MED ORDER — CEPHALEXIN 500 MG PO CAPS: 500.0000 mg | ORAL_CAPSULE | Freq: Two times a day (BID) | ORAL | 0 refills | Status: AC

## 2020-04-27 MED ORDER — AZITHROMYCIN 250 MG PO TABS: 250.0000 mg | ORAL_TABLET | Freq: Every day | ORAL | 0 refills | Status: AC

## 2020-04-27 MED ORDER — POTASSIUM CHLORIDE CRYS ER 20 MEQ PO TBCR
20.0000 meq | EXTENDED_RELEASE_TABLET | Freq: Once | ORAL | Status: AC
  Administered 2020-04-27: 20 meq via ORAL
  Filled 2020-04-27: qty 1

## 2020-04-27 NOTE — Progress Notes (Signed)
Bilateral lower extremity venous study completed.      Please see CV Proc for preliminary results.   Tres Grzywacz, RVT  

## 2020-04-27 NOTE — Consult Note (Addendum)
WOC Nurse Consult Note: Reason for Consult: Consult requested for sacrum and heels. Assessed pt with wife at the bedside; there is no wound present to the sacrum.  There are several patchy areas of partial thickness skin loss to lower inner bilat buttocks; red and moist and shallow; appearance and location is consistent with moisture associated skin damage.  Wound type: Left plantar foot with full thickness wound; 2X2X.2cm, red and dry; no odor, drainage, or fluctuance.  Pink scar tissue to left heel from previous pressure injury which has healed.  Left heel with pink dry scar tissue from previous pressure injury which has healed. Above this is a Stage 2 pressure injury; dark red fluid filled intact blister; 1X2cm Right inner heel with deep tissue pressure injury; 2X2cm with dark red intact skin Pressure Injury POA: Yes Dressing procedure/placement/frequency: Pt has heel lift boots intact bilat for pressure reduction.  Topical treatment orders provided for bedside nurses to perform to protect and promote healing as follows:  foam dressing to left plantar foot and right heel, change Q 3 days or PRN soiling. Apply barrier cream to buttocks wounds with each turning and cleaning session Please re-consult if further assistance is needed.  Thank-you,  Cammie Mcgee MSN, RN, CWOCN, [place], CNS [phone]

## 2020-04-27 NOTE — Discharge Summary (Signed)
Physician Discharge Summary  [name] VOJ:[id number] DOB: December 17, 1965 DOA: 04/26/2020  PCP: [facility]  Admit date: 04/26/2020 Discharge date: 04/27/2020  Admitted From: Home Disposition: Home   Recommendations for Outpatient Follow-up:  1. Follow up with PCP in 1-2 weeks 2. Follow up with PM&R in 1 month for trigger point injections 3. Please obtain BMP/CBC in one week 4. Please follow up on the following pending results: Urine culture (pending at DC) Addendum: E. coli with susceptibilities still pending.  Home Health: None Equipment/Devices: None Discharge Condition: Stable CODE STATUS: Full Diet recommendation: Heart healthy, carb-modified  Brief/Interim Summary: [name] is a 55 y.o. 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 There was also concern for an erythematous area on medial left thigh. In the ED, UA suggested UTI, CXR read as a "subtle ill-defined left base opacity" though the patient had no cough or dyspnea or objective respiratory abnormalities/hypoxia. CT head without acute intracranial abnormality. Covid, WBC, and procalcitonin were negative. IV fluids and antibiotics were given and the patient quickly improved over the next 24 hours. He feels well, ready to go home on the following morning. He's eating and drinking, urine becoming lighter, VSS. Left medial thigh area has no warmth or erythema on exam on day of discharge, confirmed by pt's wife. He will continue antibiotics which would cover UTI as well as possible early pneumonia and nonpurulent cellulitis. LE venous U/S was negative for DVT and the patient has not missed doses of anticoagulation. He reports his legs  swell less in the morning and progressively more when they are dependent, and he hasn't been taking lasix lately.    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   Weight as of this encounter: 127.6 kg.  Agree with RN Pressure Injury Documentation: Pressure Injury 04/27/20 Heel Right Stage 2 -  Partial thickness loss of dermis presenting as a shallow open injury with a red, pink wound bed without slough. (Active)  04/27/20   Location: Heel  Location Orientation: Right  Staging: Stage 2 -  Partial thickness loss of dermis presenting as a shallow open injury with a red, pink wound bed without slough.  Wound Description (Comments):   Present on Admission: Yes     Pressure Injury 04/27/20 Heel Right Deep Tissue Pressure Injury - Purple or maroon localized area of discolored intact skin or blood-filled blister due to damage of underlying soft tissue from pressure and/or shear. inner heel (Active)  04/27/20   Location: Heel  Location Orientation: Right  Staging: Deep Tissue Pressure Injury - Purple or maroon localized area of discolored intact skin or blood-filled blister due to damage of underlying  soft tissue from pressure and/or shear.  Wound Description (Comments): inner heel  Present on Admission: Yes   Discharge Instructions Discharge Instructions    Diet - low sodium heart healthy   Complete by: As directed    Discharge instructions   Complete by: As directed    You were evaluated for lethargy and leg swelling  found to have a urinary tract infection and a possible pneumonia. These were treated with antibiotics which you should continue at home. Keflex twice daily and azithromycin once daily have been sent to your pharmacy. You are otherwise improved and stable for discharge. If your symptoms return, seek medical attention right away.     Allergies as of 04/27/2020   No Known Allergies     Medication List    TAKE these medications   acetaminophen 500 MG tablet Commonly known as: TYLENOL Take 500 mg by mouth every 6 (six) hours as needed for mild pain.   apixaban 5 MG Tabs tablet Commonly known as: Eliquis Take 1 tablet (5 mg total) by mouth 2 (two) times daily.   ARTIFICIAL TEARS OP Apply 1-2 drops to eye daily as needed (dry eyes).   azithromycin 250 MG tablet Commonly known as: Zithromax Take 1 tablet (250 mg total) by mouth daily for 4 days.   baclofen 10 MG tablet Commonly known as: LIORESAL Take 1 tablet (10 mg total) by mouth 3 (three) times daily. For muscle spasms/tightness   BISACODYL LAXATIVE RE Place 1 suppository rectally at bedtime.   blood glucose meter kit and supplies Dispense based on patient and insurance preference. Use up to four times daily as directed. (FOR ICD-10 E10.9, E11.9).   cephALEXin 500 MG capsule Commonly known as: KEFLEX Take 1 capsule (500 mg total) by mouth 2 (two) times daily for 7 days.   docusate sodium 100 MG capsule Commonly known as: Colace Take 1 capsule (100 mg total) by mouth daily as needed. What changed: reasons to take this   FLUoxetine 20 MG capsule Commonly known as: PROZAC Take 1 capsule (20 mg total) by mouth daily. Needs appt for refills   furosemide 40 MG tablet Commonly known as: LASIX Take 1 tablet daily for 6 days, take as needed after that for swelling or weight gain What changed:   how much to take  how to take this  when to take this  reasons to take this  additional instructions   gabapentin 600 MG  tablet Commonly known as: Neurontin Take 1 tablet (600 mg total) by mouth 3 (three) times daily.   glipiZIDE 10 MG tablet Commonly known as: GLUCOTROL Take 10 mg by mouth 2 (two) times daily.   hydrOXYzine 25 MG tablet Commonly known as: ATARAX/VISTARIL Take 1 tablet (25 mg total) by mouth 3 (three) times daily as needed for itching.   Janumet 50-1000 MG tablet Generic drug: sitaGLIPtin-metformin Take 1 tablet by mouth 2 (two) times daily with a meal.       [place] Follow up.   Specialty: Pediatrics Contact information: [place] [address] [phone]              No Known Allergies  Consultations:  None  Procedures/Studies: DG Chest 2 View  Result Date: 04/26/2020 CLINICAL DATA:  Fatigue EXAM: CHEST - 2 VIEW COMPARISON:  October 04, 2004 FINDINGS: Subtle airspace opacity noted in the medial left base. There is slight bibasilar atelectasis. Lungs elsewhere clear. Heart is  upper normal in size with pulmonary vascularity normal. No adenopathy appreciable. Postoperative change noted in the cervical and upper to midthoracic regions. IMPRESSION: Subtle ill-defined opacity left base concerning for early pneumonia. Mild bibasilar atelectasis. Lungs otherwise clear. Heart upper normal in size. Extensive cervical and thoracic postoperative changes. Electronically Signed   By: Lowella Grip III M.D.   On: 04/26/2020 13:50   CT Head Wo Contrast  Result Date: 04/26/2020 CLINICAL DATA:  Mental status change, unknown cause. EXAM: CT HEAD WITHOUT CONTRAST TECHNIQUE: Contiguous axial images were obtained from the base of the skull through the vertex without intravenous contrast. COMPARISON:  Brain MRI 04/24/2015.  Head CT 01/26/2005. FINDINGS: Brain: Cerebral volume is normal for age. There is no acute intracranial hemorrhage. No demarcated cortical infarct. No extra-axial fluid collection. No evidence of  intracranial mass. No midline shift. Vascular: No hyperdense vessel.  Atherosclerotic calcifications. Skull: Normal. Negative for fracture or focal lesion. Sinuses/Orbits: Chronic medially displaced fracture deformity of the left lamina papyracea. Visualized orbits show no acute finding. Mild bilateral frontal and ethmoid sinus mucosal thickening. Frothy secretions within the right sphenoid sinus. Mild bilateral maxillary sinus mucosal thickening. IMPRESSION: No evidence of acute intracranial abnormality. Paranasal sinus disease as described. Correlate for acute sinusitis. Electronically Signed   By: Kellie Simmering DO   On: 04/26/2020 18:15   VAS [place] LOWER EXTREMITY VENOUS (DVT)  Result Date: 04/27/2020  Lower Venous DVT Study Indications: Edema.  Risk Factors: HX of DVT DVT HX of Chronic DVT and IVC Filter. Anticoagulation: Eliquis. Limitations: Body habitus. Comparison Study: 8/21 RT Chronic DVT FV                   LT Chronic DVT FV                   Bilateral calf vessels not seen Performing Technologist: Vonzell Schlatter RVT  Examination Guidelines: A complete evaluation includes B-mode imaging, spectral Doppler, color Doppler, and power Doppler as needed of all accessible portions of each vessel. Bilateral testing is considered an integral part of a complete examination. Limited examinations for reoccurring indications may be performed as noted. The reflux portion of the exam is performed with the patient in reverse Trendelenburg.  +---------+---------------+---------+-----------+----------+--------------+ RIGHT    CompressibilityPhasicitySpontaneityPropertiesThrombus Aging +---------+---------------+---------+-----------+----------+--------------+ CFV      Full           Yes      Yes                                 +---------+---------------+---------+-----------+----------+--------------+ SFJ      Full                                                         +---------+---------------+---------+-----------+----------+--------------+ FV Prox  Full                                                        +---------+---------------+---------+-----------+----------+--------------+ FV Mid  Not visualized +---------+---------------+---------+-----------+----------+--------------+ FV Distal                                             Not visualized +---------+---------------+---------+-----------+----------+--------------+ PFV                                                   Not visualized +---------+---------------+---------+-----------+----------+--------------+ POP      Full           Yes      Yes                                 +---------+---------------+---------+-----------+----------+--------------+ PTV                                                   Not visualized +---------+---------------+---------+-----------+----------+--------------+ PERO                                                  Not visualized +---------+---------------+---------+-----------+----------+--------------+   +---------+---------------+---------+-----------+----------+--------------+ LEFT     CompressibilityPhasicitySpontaneityPropertiesThrombus Aging +---------+---------------+---------+-----------+----------+--------------+ CFV      Full           Yes      Yes                                 +---------+---------------+---------+-----------+----------+--------------+ SFJ      Full                                                        +---------+---------------+---------+-----------+----------+--------------+ FV Prox  Partial                                      Chronic        +---------+---------------+---------+-----------+----------+--------------+ FV Mid                                                Not visualized  +---------+---------------+---------+-----------+----------+--------------+ FV Distal                                             Not visualized +---------+---------------+---------+-----------+----------+--------------+ PFV  Not visualized +---------+---------------+---------+-----------+----------+--------------+ POP      Full           Yes      Yes                                 +---------+---------------+---------+-----------+----------+--------------+ PTV                                                   Not visualized +---------+---------------+---------+-----------+----------+--------------+ PERO                                                  Not visualized +---------+---------------+---------+-----------+----------+--------------+  Summary: BILATERAL: - No evidence of superficial venous thrombosis in the lower extremities, bilaterally. - RIGHT: - There is no evidence of deep vein thrombosis in the lower extremity. However, portions of this examination were limited- see technologist comments above.  - No cystic structure found in the popliteal fossa.  LEFT: - There is no evidence of deep vein thrombosis in the lower extremity. However, portions of this examination were limited- see technologist comments above.  - No cystic structure found in the popliteal fossa.  *See table(s) above for measurements and observations. Electronically signed by Servando Snare MD on 04/27/2020 at 5:52:11 PM.    Final      Subjective: Feels well. Alert, no fever. Does report having had some chills yesterday, none today. Legs are swollen near their baseline, though this waxes and wanes through the day, usually improves with lasix which he hasn't been taking.   Discharge Exam:  04/27/20 0244  BP: 94/61  Pulse: 64  Resp: 18  Temp: 97.8 F (36.6 C)  SpO2: 98%   General: Pt is alert, awake, not in acute distress Cardiovascular: RRR,  S1/S2 +, no rubs, no gallops Respiratory: CTA bilaterally, no wheezing, no rhonchi Extremities: Woody, LE edema, no cyanosis  Labs: BNP (last 3 results) Recent Labs    04/26/20 2010  BNP 33.5   Basic Metabolic Panel: Recent Labs  Lab 04/26/20 1245 04/27/20 0211  NA 139 140  K 3.6 3.4*  CL 105 108  CO2 26 26  GLUCOSE 319* 218*  BUN 5* <5*  CREATININE 0.60* 0.38*  CALCIUM 8.8* 8.8*   Liver Function Tests: Recent Labs  Lab 04/26/20 1245  AST 23  ALT 24  ALKPHOS 71  BILITOT 0.7  PROT 6.8  ALBUMIN 3.3*   No results for input(s): LIPASE, AMYLASE in the last 168 hours. Recent Labs  Lab 04/26/20 1254  AMMONIA 29   CBC: Recent Labs  Lab 04/26/20 1500 04/27/20 0211  WBC 5.8 5.3  NEUTROABS 4.2  --   HGB 14.1 13.5  HCT 41.9 40.2  MCV 91.9 92.2  PLT 219 211   Cardiac Enzymes: No results for input(s): CKTOTAL, CKMB, CKMBINDEX, TROPONINI in the last 168 hours. BNP: Invalid input(s): POCBNP CBG: Recent Labs  Lab 04/26/20 2007 04/27/20 0032 04/27/20 0432 04/27/20 0808 04/27/20 1618  GLUCAP 214* 170* 263* 191* 231*   D-Dimer No results for input(s): DDIMER in the last 72 hours. Hgb A1c Recent Labs    04/26/20 1928  HGBA1C 8.2*   Lipid Profile No  results for input(s): CHOL, HDL, LDLCALC, TRIG, CHOLHDL, LDLDIRECT in the last 72 hours. Thyroid function studies No results for input(s): TSH, T4TOTAL, T3FREE, THYROIDAB in the last 72 hours.  Invalid input(s): FREET3 Anemia work up No results for input(s): VITAMINB12, FOLATE, FERRITIN, TIBC, IRON, RETICCTPCT in the last 72 hours. Urinalysis    Component Value Date/Time   COLORURINE YELLOW 04/26/2020 1739   APPEARANCEUR HAZY (A) 04/26/2020 1739   LABSPEC 1.024 04/26/2020 1739   PHURINE 7.0 04/26/2020 1739   GLUCOSEU 150 (A) 04/26/2020 1739   HGBUR NEGATIVE 04/26/2020 1739   BILIRUBINUR NEGATIVE 04/26/2020 1739   KETONESUR NEGATIVE 04/26/2020 1739   PROTEINUR NEGATIVE 04/26/2020 1739   NITRITE  NEGATIVE 04/26/2020 1739   LEUKOCYTESUR SMALL (A) 04/26/2020 1739    Microbiology Recent Results (from the past 240 hour(s))  Culture, blood (Routine x 2)     Status: None (Preliminary result)   Collection Time: 04/26/20 12:45 PM   Specimen: BLOOD  Result Value Ref Range Status   Specimen Description BLOOD RIGHT ANTECUBITAL  Final   Special Requests   Final    BOTTLES DRAWN AEROBIC AND ANAEROBIC Blood Culture adequate volume   Culture   Final    NO GROWTH 2 DAYS Performed at [facility], [place] [address], [place], [place] [address]    Report Status PENDING  Incomplete  Culture, blood (Routine x 2)     Status: None (Preliminary result)   Collection Time: 04/26/20  4:41 PM   Specimen: BLOOD  Result Value Ref Range Status   Specimen Description BLOOD SITE NOT SPECIFIED  Final   Special Requests   Final    BOTTLES DRAWN AEROBIC AND ANAEROBIC Blood Culture adequate volume   Culture   Final    NO GROWTH 2 DAYS Performed at [facility], [address] [place], [place] [address]    Report Status PENDING  Incomplete  Urine Culture     Status: Abnormal (Preliminary result)   Collection Time: 04/26/20  5:04 PM   Specimen: Urine, Random  Result Value Ref Range Status   Specimen Description URINE, RANDOM  Final   Special Requests NONE  Final   Culture (A)  Final    >=100,000 COLONIES/mL ESCHERICHIA COLI SUSCEPTIBILITIES TO FOLLOW Performed at [facility], [place] [address] [place], [place] [address]    Report Status PENDING  Incomplete  Resp Panel by RT-PCR (Flu A&B, Covid) Nasopharyngeal Swab     Status: None   Collection Time: 04/26/20  7:22 PM   Specimen: Nasopharyngeal Swab; Nasopharyngeal(NP) swabs in vial transport medium  Result Value Ref Range Status   SARS Coronavirus 2 by RT PCR NEGATIVE NEGATIVE Final    Comment: (NOTE) SARS-CoV-2 target nucleic acids are NOT DETECTED.  The SARS-CoV-2 RNA is generally detectable in upper respiratory specimens during  the acute phase of infection. The lowest concentration of SARS-CoV-2 viral copies this assay can detect is 138 copies/mL. A negative result does not preclude SARS-Cov-2 infection and should not be used as the sole basis for treatment or other patient management decisions. A negative result may occur with  improper specimen collection/handling, submission of specimen other than nasopharyngeal swab, presence of viral mutation(s) within the areas targeted by this assay, and inadequate number of viral copies(<138 copies/mL). A negative result must be combined with clinical observations, patient history, and epidemiological information. The expected result is Negative.  Fact Sheet for Patients:  EntrepreneurPulse.com.au  Fact Sheet for Healthcare Providers:  IncredibleEmployment.be  This test is no t yet approved or cleared by the [place] and  has been authorized for detection and/or diagnosis of SARS-CoV-2 by FDA under an Emergency Use Authorization (EUA). This EUA will remain  in effect (meaning this test can be used) for the duration of the COVID-19 declaration under Section 564(b)(1) of the Act, 21 U.S.C.section 360bbb-3(b)(1), unless the authorization is terminated  or revoked sooner.       Influenza A by PCR NEGATIVE NEGATIVE Final   Influenza B by PCR NEGATIVE NEGATIVE Final    Comment: (NOTE) The Xpert Xpress SARS-CoV-2/FLU/RSV plus assay is intended as an aid in the diagnosis of influenza from Nasopharyngeal swab specimens and should not be used as a sole basis for treatment. Nasal washings and aspirates are unacceptable for Xpert Xpress SARS-CoV-2/FLU/RSV testing.  Fact Sheet for Patients: EntrepreneurPulse.com.au  Fact Sheet for Healthcare Providers: IncredibleEmployment.be  This test is not yet approved or cleared by the [place] FDA and has been authorized for detection and/or  diagnosis of SARS-CoV-2 by FDA under an Emergency Use Authorization (EUA). This EUA will remain in effect (meaning this test can be used) for the duration of the COVID-19 declaration under Section 564(b)(1) of the Act, 21 U.S.C. section 360bbb-3(b)(1), unless the authorization is terminated or revoked.  Performed at [facility], [place] [address] [place], [place] [address]     Time coordinating discharge: Approximately 40 minutes  Patrecia Pour, MD  [facility] 04/28/2020, 6:28 PM

## 2020-04-29 LAB — URINE CULTURE: Culture: >=100000 — AB

## 2020-05-01 LAB — CULTURE, BLOOD (ROUTINE X 2)
Culture: NO GROWTH
Culture: NO GROWTH
Special Requests: ADEQUATE
Special Requests: ADEQUATE

## 2020-06-12 ENCOUNTER — Encounter
Payer: Medicaid Other | Attending: Physical Medicine and Rehabilitation | Admitting: Physical Medicine and Rehabilitation

## 2020-06-12 ENCOUNTER — Other Ambulatory Visit: Payer: Self-pay

## 2020-06-12 ENCOUNTER — Encounter: Payer: Self-pay | Admitting: Physical Medicine and Rehabilitation

## 2020-06-12 VITALS — BP 113/76 | HR 62 | Temp 98.0°F

## 2020-06-12 DIAGNOSIS — G8250 Quadriplegia, unspecified: Secondary | ICD-10-CM | POA: Insufficient documentation

## 2020-06-12 DIAGNOSIS — M7918 Myalgia, other site: Secondary | ICD-10-CM | POA: Diagnosis present

## 2020-06-12 DIAGNOSIS — R252 Cramp and spasm: Secondary | ICD-10-CM | POA: Insufficient documentation

## 2020-06-12 NOTE — Progress Notes (Signed)
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    Also has R inner heel and L heel stage II and blister- seen mid March 2022 in hospital for PNA? LLL CAP.   Here for SCI f/u and trigger point injections.   Feeling much better- since was in hospital 6 weeks ago.  Ulcers on B/L heels are healed.   Only wants the injections- no other major issues.  Refilled the meds at last visit.   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     Plan:  1. Patient here for trigger point injections for  Consent done and on chart.  Cleaned areas with alcohol and injected using a 27 gauge 1.5 inch needle  Injected 6cc Using 1% Lidocaine with no EPI  Upper traps B/L Levators B/L Posterior scalenes B/L x2 Middle scalenes Splenius Capitus B/L Pectoralis Major B/L Rhomboids B/L Infraspinatus Teres Major/minor Thoracic paraspinals Lumbar paraspinals Other injections- B/L deltoids and B/L extensors of forearms B/L    Patient's level of pain prior was 7/10 Current level of pain after injections is 5/10  There was no bleeding or complications.  Patient was advised to drink a lot of water on day after injections to flush system Will have increased soreness for 12-48 hours after injections.  Can use Lidocaine patches the day AFTER injections Can use theracane on day of injections in places didn't inject Can use heating pad 4-6 hours AFTER injections  2. PT referral to see if has right equipment, and strengthening and help with myofascial pain since is quadriplegic.     3. F/U 6 weeks- for trigger point injections.

## 2020-06-12 NOTE — Patient Instructions (Signed)
1. Patient here for trigger point injections for  Consent done and on chart.  Cleaned areas with alcohol and injected using a 27 gauge 1.5 inch needle  Injected 6cc Using 1% Lidocaine with no EPI  Upper traps B/L Levators B/L Posterior scalenes B/L x2 Middle scalenes Splenius Capitus B/L Pectoralis Major B/L Rhomboids B/L Infraspinatus Teres Major/minor Thoracic paraspinals Lumbar paraspinals Other injections- B/L deltoids and B/L extensors of forearms B/L    Patient's level of pain prior was 7/10 Current level of pain after injections is 5/10  There was no bleeding or complications.  Patient was advised to drink a lot of water on day after injections to flush system Will have increased soreness for 12-48 hours after injections.  Can use Lidocaine patches the day AFTER injections Can use theracane on day of injections in places didn't inject Can use heating pad 4-6 hours AFTER injections  2. PT referral to see if has right equipment, and strengthening and help with myofascial pain since is quadriplegic.     3. F/U 6 weeks- for trigger point injections.

## 2020-07-05 ENCOUNTER — Ambulatory Visit: Payer: Medicaid Other | Attending: Physical Medicine and Rehabilitation

## 2020-07-05 ENCOUNTER — Other Ambulatory Visit: Payer: Self-pay

## 2020-07-05 DIAGNOSIS — M6281 Muscle weakness (generalized): Secondary | ICD-10-CM | POA: Diagnosis not present

## 2020-07-05 DIAGNOSIS — M25511 Pain in right shoulder: Secondary | ICD-10-CM | POA: Diagnosis present

## 2020-07-05 DIAGNOSIS — M25612 Stiffness of left shoulder, not elsewhere classified: Secondary | ICD-10-CM | POA: Insufficient documentation

## 2020-07-05 DIAGNOSIS — G8929 Other chronic pain: Secondary | ICD-10-CM | POA: Insufficient documentation

## 2020-07-05 DIAGNOSIS — M25512 Pain in left shoulder: Secondary | ICD-10-CM | POA: Insufficient documentation

## 2020-07-05 DIAGNOSIS — M25611 Stiffness of right shoulder, not elsewhere classified: Secondary | ICD-10-CM | POA: Diagnosis present

## 2020-07-05 NOTE — Therapy (Signed)
OUTPATIENT PHYSICAL THERAPY NEURO EVALUATION   [name] MRN: [id number] DOB:30-Jul-1965, 55 y.o., male Today's Date: 07/05/2020  PCP: [facility] REFERRING PROVIDER: Genice Rouge, MD   PT End of Session - 07/05/20 1219    Visit Number 1    Number of Visits 9    Date for PT Re-Evaluation 09/06/20    PT Start Time 1100    PT Stop Time 1145    PT Time Calculation (min) 45 min    Activity Tolerance Patient tolerated treatment well    Behavior During Therapy [facility] for tasks assessed/performed           Past Medical History:  Diagnosis Date  . Diabetes mellitus without complication (HCC)   . History of cervical fracture    Past Surgical History:  Procedure Laterality Date  . CERVICAL SPINE SURGERY     Patient Active Problem List   Diagnosis Date Noted  . Acute metabolic encephalopathy 04/26/2020  . Pneumonia 04/26/2020  . DM2 (diabetes mellitus, type 2) (HCC) 04/26/2020  . History of DVT (deep vein thrombosis) 04/26/2020  . Acute lower UTI 04/26/2020  . Community acquired pneumonia of left lower lobe of lung 04/26/2020  . At risk for unstable body temperature 11/26/2019  . Wheelchair dependence 11/26/2019  . Pressure ulcer 09/22/2019  . Cellulitis 09/22/2019  . Hypokalemia 09/22/2019  . Spasticity 03/08/2019  . Erectile dysfunction due to diseases classified elsewhere 03/08/2019  . 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 Neurogenic bowel 01/22/2019  . Neurogenic bladder 01/22/2019  . Myofascial pain 01/22/2019  . 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                                                                                                                                                                                                    PERTINENT HISTORY: C7 SCI  PAIN:  Are you having pain? Yes VAS scale: 2-8/10 Pain location: Upper back Pain orientation: Bilateral  PAIN TYPE: aching Pain description: constant  Aggravating factors: sitting, reaching  Relieving factors: rest  PRECAUTIONS: None  WEIGHT BEARING RESTRICTIONS No  FALLS: Has patient fallen in last 6 months? No, Number of falls: 0  LIVING ENVIRONMENT: Lives with: lives with their family Lives in: House/apartment Stairs: No;  Has following equipment at home: Single point cane, Wheelchair (power) and Ramped entry  PLOF: Requires assistive device for independence, Needs assistance with ADLs, Needs assistance with homemaking and Needs assistance with transfers  PATIENT GOALS Be more active  OBJECTIVE:     COGNITION: Overall cognitive status: Within functional limits for tasks assessed  AROM Right Left      Shoulder flexion 145 145  Shoulder abduction 90 90    Right Left      Shoulder flexion 5/5 5/5  Shoulder abduction 5/5 5/5  Shoulder internal  rotation 5/5 5/5  Shoulder external  rotation 5/5 5/5  Elbow flexion 5/5 5/5  Elbow extension 5/5 5/5   TODAY'S TREATMENT:  Patient education: We discussed motor control in LE and how it is less likely for pt to gain control back based on his injury We discussed importance of gaining trunk control to improve upright posture in sitting which help him participate more in his ADLs at home Pt educated on helping his wife with some daily tasks to improve his daily participation and reduce caregiver burden Gave them  resources for arm bike that they can put on dining table and he can gradually improve his tolerance. We discussed starting out with 2 min per week and then adding 2 min each week until he can use that arm bike for 20 min a day We discussed that he should at minimum help his wife with 3 different tasks at home. Used arm bike for 2'   PATIENT EDUCATION: Education details: see above Person educated: Patient, Spouse and translator Education method: Explanation Education comprehension: verbalized understanding   HOME EXERCISE PROGRAM: TBD  ASSESSMENT:  CLINICAL IMPRESSION: Patient is a 55 y.o. male who was seen today for physical therapy evaluation and treatment for decreased shoulder ROM bil, pain in thoracic spine and decreased mobility. Objective impairments include decreased activity tolerance, decreased endurance, decreased mobility, decreased ROM, decreased strength, impaired flexibility, impaired sensation, impaired tone, postural dysfunction and pain. These impairments are limiting patient from cleaning, meal prep, laundry, medication management and personal finances. Personal factors including Age, Past/current experiences and Time since onset of injury/illness/exacerbation are also affecting patient's functional outcome. Patient will benefit from skilled PT to address above impairments and improve overall function.  REHAB POTENTIAL: Fair SCI level  CLINICAL DECISION MAKING: Stable/uncomplicated  EVALUATION COMPLEXITY: Low   GOALS: Goals reviewed with patient? Yes  SHORT TERM GOALS:  STG Name Target Date Goal status  1 Pt will assist his wife with 3 tasks to be an active participant in managing household and to improve I and reduce caregiver burden. Baseline: 07/05/20 Wife helps with him with most tasks. 08/02/2020  INITIAL   LONG TERM GOALS:   LTG Name Target Date Goal status  1 Pt will be able to maintain 5 min of unsupported sitting balance at edge of bed to improve trunk  control with dynamic activities. Baseline: 07/05/20 Unable to sit unsupported 08/30/2020  INITIAL  2 Pt will be able to use arm bike for at least 20 min daily to improve UE control and endurance.  Baseline: 07/05/20- pt educated to purchase the arm bike (sits on table) 08/30/2020  INITIAL  3 Pt will demo at least 160 deg of shoulder flexion and 135  deg of shoulder abduction bil to be able to reach in [facility] cabinets from his power chair. Baseline: 07/05/20: 145 deg flexion and 90 deg abduction bil 08/30/2020  INITIAL   PLAN: PT FREQUENCY: 1x/week  PT DURATION: 8 weeks  PLANNED INTERVENTIONS: Therapeutic exercises, Therapeutic activity, Neuro Muscular re-education, Patient/Family education, Joint mobilization, Wheelchair mobility training, Cryotherapy, Moist heat and Manual therapy  PLAN FOR NEXT SESSION: Work on bil shoulder ROM (pulleys?); work on unsupported sitting; use tabletop arm bike   Ileana Ladd, PT 07/05/2020, 12:22 PM  [place] [facility] [address] [place], [place], [address] Phone: [phone]   Fax:  [phone]

## 2020-07-18 ENCOUNTER — Other Ambulatory Visit: Payer: Self-pay

## 2020-07-18 ENCOUNTER — Other Ambulatory Visit: Payer: Self-pay | Admitting: Physical Medicine and Rehabilitation

## 2020-07-18 ENCOUNTER — Ambulatory Visit: Payer: Medicaid Other | Attending: Physical Medicine and Rehabilitation

## 2020-07-18 DIAGNOSIS — M25611 Stiffness of right shoulder, not elsewhere classified: Secondary | ICD-10-CM | POA: Insufficient documentation

## 2020-07-18 DIAGNOSIS — G8252 Quadriplegia, C1-C4 incomplete: Secondary | ICD-10-CM | POA: Diagnosis present

## 2020-07-18 DIAGNOSIS — R293 Abnormal posture: Secondary | ICD-10-CM | POA: Insufficient documentation

## 2020-07-18 DIAGNOSIS — M25512 Pain in left shoulder: Secondary | ICD-10-CM | POA: Diagnosis present

## 2020-07-18 DIAGNOSIS — M25511 Pain in right shoulder: Secondary | ICD-10-CM | POA: Insufficient documentation

## 2020-07-18 DIAGNOSIS — M25612 Stiffness of left shoulder, not elsewhere classified: Secondary | ICD-10-CM

## 2020-07-18 DIAGNOSIS — G8929 Other chronic pain: Secondary | ICD-10-CM | POA: Insufficient documentation

## 2020-07-18 DIAGNOSIS — M6281 Muscle weakness (generalized): Secondary | ICD-10-CM | POA: Insufficient documentation

## 2020-07-18 DIAGNOSIS — G8253 Quadriplegia, C5-C7 complete: Secondary | ICD-10-CM | POA: Insufficient documentation

## 2020-07-18 NOTE — Therapy (Signed)
OUTPATIENT PHYSICAL THERAPY TREATMENT NOTE   [name] MRN: [id number] DOB:[birth year], 55 y.o., male Today's Date: 07/18/2020  PCP: [facility] REFERRING PROVIDER: Genice Rouge, MD   PT End of Session - 07/18/20 1105    Visit Number 2    Number of Visits 9    Date for PT Re-Evaluation 09/06/20    PT Start Time 1100    PT Stop Time 1145    PT Time Calculation (min) 45 min    Activity Tolerance Patient tolerated treatment well    Behavior During Therapy [facility] for tasks assessed/performed           Past Medical History:  Diagnosis Date  . Diabetes mellitus without complication (HCC)   . History of cervical fracture    Past Surgical History:  Procedure Laterality Date  . CERVICAL SPINE SURGERY     Patient Active Problem List   Diagnosis Date Noted  . Acute metabolic encephalopathy 04/26/2020  . Pneumonia 04/26/2020  . DM2 (diabetes mellitus, type 2) (HCC) 04/26/2020  . History of DVT (deep vein thrombosis) 04/26/2020  . Acute lower UTI 04/26/2020  . Community acquired pneumonia of left lower lobe of lung 04/26/2020  . At risk for unstable body temperature 11/26/2019  . Wheelchair dependence 11/26/2019  . Pressure ulcer 09/22/2019  . Cellulitis 09/22/2019  . Hypokalemia 09/22/2019  . Spasticity 03/08/2019  . Erectile dysfunction due to diseases classified elsewhere 03/08/2019  . 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 Neurogenic bowel 01/22/2019  . Neurogenic bladder 01/22/2019  . Myofascial pain 01/22/2019  . 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 Quadriplegia (HCC) 12/30/2018    ONSET DATE: 06/12/20   REFERRING DIAG: G82.50 (ICD-10-CM) - Quadriplegia (HCC)    THERAPY DIAG:  Muscle weakness (generalized)  Stiffness of left shoulder, not elsewhere classified  Stiffness of right shoulder, not elsewhere classified  Chronic right shoulder pain  Chronic left shoulder pain  PERTINENT HISTORY: C7 SCI     PRECAUTIONS: none  SUBJECTIVE: No new symptoms. Stable.  PAIN:  Are you having pain? Yes VAS scale: 5/10 Pain location: bil shoulders, Also medial forearm into pinky and ring finger on R arm. Pain orientation: Right and Left  PAIN TYPE: aching and tingling Pain description: intermittent      OBJECTIVE:   TODAY'S TREATMENT: 07/18/20: Soft tissue mobilization to bil upper trap and cervical paraspinalis PROM  of bil shoulders into flexion and horizontal abduction from pec stretch Sit to stand and bac to sitting: using standing frame: max to total A x 2 Standing in standing frame for 5' Seated biceps curs: 5lbs 20x R and L Seated OH press: 5lbs 2 x 5 R and L  PATIENT EDUCATION: Education details: see above Person educated: Patient, Spouse and translator Education method: Explanation Education comprehension: verbalized understanding     HOME EXERCISE PROGRAM: Access Code [email or web address]   ASSESSMENT:   CLINICAL IMPRESSION: Pt tolerated session well. Patient required total A x 2 with sit to stand transfers with standing frame. Patient's UE strength is not adequate enough to assist with standing frame transfers just yet. Pt reported improved shoulder pain and neck pain at end of the session to 4/10 but improved flexibility.  REHAB POTENTIAL: Fair SCI level   CLINICAL DECISION MAKING: Stable/uncomplicated   EVALUATION COMPLEXITY: Low     GOALS: Goals reviewed with patient? Yes   SHORT TERM GOALS:   STG Name Target Date Goal status  1 Pt will  assist his wife with 3 tasks to be an active participant in managing household and to improve I and reduce caregiver burden. Baseline: 07/05/20 Wife helps with him with most tasks. 08/02/2020   INITIAL    LONG TERM GOALS:    LTG Name Target Date Goal status  1 Pt will be able to maintain 5 min of unsupported sitting balance at edge of bed to improve trunk control with dynamic activities. Baseline: 07/05/20 Unable to sit unsupported  08/30/2020   INITIAL  2 Pt will be able to use arm bike for at least 20 min daily to improve UE control and endurance.   Baseline: 07/05/20- pt educated to purchase the arm bike (sits on table) 08/30/2020   INITIAL  3 Pt will demo at least 160 deg of shoulder flexion and 135 deg of shoulder abduction bil to be able to reach in [facility] cabinets from his power chair. Baseline: 07/05/20: 145 deg flexion and 90 deg abduction bil 08/30/2020   INITIAL    PLAN: PT FREQUENCY: 1x/week   PT DURATION: 8 weeks   PLANNED INTERVENTIONS: Therapeutic exercises, Therapeutic activity, Neuro Muscular re-education, Patient/Family education, Joint mobilization, Wheelchair mobility training, Cryotherapy, Moist heat and Manual therapy   PLAN FOR NEXT SESSION: Work on bil shoulder ROM (pulleys?); work on unsupported sitting; use tabletop arm bike, try standing frame.      Ileana Ladd, PT 07/18/2020, 11:47 AM    [place] [facility] [address] [place], [place], [address] Phone: [phone]   Fax:  [phone]  [name] MRN: [id number] DOB: 1965-08-20

## 2020-07-25 ENCOUNTER — Ambulatory Visit: Payer: Medicaid Other

## 2020-07-25 ENCOUNTER — Other Ambulatory Visit: Payer: Self-pay

## 2020-07-25 DIAGNOSIS — M6281 Muscle weakness (generalized): Secondary | ICD-10-CM | POA: Diagnosis not present

## 2020-07-25 DIAGNOSIS — M25612 Stiffness of left shoulder, not elsewhere classified: Secondary | ICD-10-CM

## 2020-07-25 DIAGNOSIS — G8252 Quadriplegia, C1-C4 incomplete: Secondary | ICD-10-CM

## 2020-07-25 DIAGNOSIS — R293 Abnormal posture: Secondary | ICD-10-CM

## 2020-07-25 DIAGNOSIS — M25512 Pain in left shoulder: Secondary | ICD-10-CM

## 2020-07-25 DIAGNOSIS — M25611 Stiffness of right shoulder, not elsewhere classified: Secondary | ICD-10-CM

## 2020-07-25 DIAGNOSIS — M25511 Pain in right shoulder: Secondary | ICD-10-CM

## 2020-07-25 NOTE — Therapy (Signed)
OUTPATIENT PHYSICAL THERAPY TREATMENT NOTE   Patient Name: Rithwik Schmieg MRN: [id number] DOB:May 19, 1965, 55 y.o., male Today's Date: 07/25/2020  PCP: [facility] REFERRING PROVIDER: Genice Rouge, MD   PT End of Session - 07/25/20 1201     Visit Number 3    Number of Visits 9    Date for PT Re-Evaluation 09/06/20    PT Start Time 1100    PT Stop Time 1145    PT Time Calculation (min) 45 min    Activity Tolerance Patient tolerated treatment well    Behavior During Therapy [facility] for tasks assessed/performed             Past Medical History:  Diagnosis Date   Diabetes mellitus without complication (HCC)    History of cervical fracture    Past Surgical History:  Procedure Laterality Date   CERVICAL SPINE SURGERY     Patient Active Problem List   Diagnosis Date Noted   Acute metabolic encephalopathy 04/26/2020   Pneumonia 04/26/2020   DM2 (diabetes mellitus, type 2) (HCC) 04/26/2020   History of DVT (deep vein thrombosis) 04/26/2020   Acute lower UTI 04/26/2020   Community acquired pneumonia of left lower lobe of lung 04/26/2020   At risk for unstable body temperature 11/26/2019   Wheelchair dependence 11/26/2019   Pressure ulcer 09/22/2019   Cellulitis 09/22/2019   Hypokalemia 09/22/2019   Spasticity 03/08/2019   Erectile dysfunction due to diseases classified elsewhere 03/08/2019   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/22/2019   Neurogenic bowel 01/22/2019   Neurogenic bladder 01/22/2019   Myofascial pain 01/22/2019   Spinal cord injury at T1-T6 level (HCC) 12/30/2018   Quadriplegia (HCC) 12/30/2018    ONSET DATE: 06/12/20   REFERRING DIAG: G82.50 (ICD-10-CM) - Quadriplegia (HCC)    THERAPY DIAG:  Muscle weakness (generalized)  Stiffness of left shoulder, not elsewhere classified  Stiffness of right shoulder, not elsewhere classified  Chronic right shoulder pain  Chronic left shoulder pain  Quadriplegia, C1-C4 incomplete  (HCC)  Abnormal posture  PERTINENT HISTORY: C7 SCI    PRECAUTIONS: none  SUBJECTIVE: No new symptoms. Pt feels more looser through shoulders. He reports weather changes affects his pain.  PAIN:  Are you having pain? Yes VAS scale: 5/10 Pain location: bil shoulders, Also medial forearm into pinky and ring finger on R arm. Pain orientation: Right and Left  PAIN TYPE: aching and tingling Pain description: intermittent      OBJECTIVE:   TODAY'S TREATMENT: 07/25/20: Soft tissue mobilization to bil upper trap and cervical paraspinalis PROM  of bil shoulders into flexion and horizontal adduction ffor posterior capsule stretch bil Seated AA lumbar flexion with both hands on table in front of him: 15x Bil seated rows: 35 lbs bil:  pt cued to engage paraspinalis for back stabilization and asked to lean forward to initiate pull to engage core.: 35lbs x 15 Bil lat pull downs: 35lbs: 15x, glove required for L hand as pt kept loosing grip on that hand Bil seated chest press: 20lbs 15x, glove required on L hand   07/18/20: Soft tissue mobilization to bil upper trap and cervical paraspinalis PROM  of bil shoulders into flexion and horizontal abduction from pec stretch Sit to stand and bac to sitting: using standing frame: max to total A x 2 Standing in standing frame for 5' Seated biceps curs: 5lbs 20x R and L Seated OH press: 5lbs 2 x 5 R and L  PATIENT EDUCATION: Education details: Discussed finding a  gym before discharge in PT where he can go and do similar exercises to improve and maintain UE strength. Pt educated to assist his wife with her daily chores to improve his independence, reduce caregiver burden and improve his functional use of his UE and core.  Person educated: Patient, Spouse and translator Education method: Explanation Education comprehension: verbalized understanding     HOME EXERCISE PROGRAM: Access Code [email or web address]   ASSESSMENT:   CLINICAL IMPRESSION: Today's  skilled session was focused and working on core strength, forward reaching in wheelchair, and improving UE strength to improve function.  REHAB POTENTIAL: Fair SCI level   CLINICAL DECISION MAKING: Stable/uncomplicated   EVALUATION COMPLEXITY: Low     GOALS: Goals reviewed with patient? Yes   SHORT TERM GOALS:   STG Name Target Date Goal status  1 Pt will assist his wife with 3 tasks to be an active participant in managing household and to improve I and reduce caregiver burden. Baseline: 07/05/20 Wife helps with him with most tasks. 08/02/2020   INITIAL    LONG TERM GOALS:    LTG Name Target Date Goal status  1 Pt will be able to maintain 5 min of unsupported sitting balance at edge of bed to improve trunk control with dynamic activities. Baseline: 07/05/20 Unable to sit unsupported 08/30/2020   INITIAL  2 Pt will be able to use arm bike for at least 20 min daily to improve UE control and endurance.   Baseline: 07/05/20- pt educated to purchase the arm bike (sits on table) 08/30/2020   INITIAL  3 Pt will demo at least 160 deg of shoulder flexion and 135 deg of shoulder abduction bil to be able to reach in [facility] cabinets from his power chair. Baseline: 07/05/20: 145 deg flexion and 90 deg abduction bil 08/30/2020   INITIAL    PLAN: PT FREQUENCY: 1x/week   PT DURATION: 8 weeks   PLANNED INTERVENTIONS: Therapeutic exercises, Therapeutic activity, Neuro Muscular re-education, Patient/Family education, Joint mobilization, Wheelchair mobility training, Cryotherapy, Moist heat and Manual therapy   PLAN FOR NEXT SESSION: Work on bil shoulder ROM (pulleys?); work on unsupported sitting; use tabletop arm bike, try standing frame.      Ileana Ladd, PT 07/25/2020, 12:12 PM    [place] [facility] [address] [place], [place], [address] Phone: [phone]   Fax:  [phone]  Patient name: Graham Hyun MRN: [id number] DOB:  [birth year]

## 2020-07-28 ENCOUNTER — Encounter
Payer: Medicaid Other | Attending: Physical Medicine and Rehabilitation | Admitting: Physical Medicine and Rehabilitation

## 2020-07-28 ENCOUNTER — Encounter: Payer: Self-pay | Admitting: Physical Medicine and Rehabilitation

## 2020-07-28 ENCOUNTER — Other Ambulatory Visit: Payer: Self-pay

## 2020-07-28 VITALS — BP 103/69 | HR 64 | Temp 97.9°F

## 2020-07-28 DIAGNOSIS — Z993 Dependence on wheelchair: Secondary | ICD-10-CM | POA: Insufficient documentation

## 2020-07-28 DIAGNOSIS — R252 Cramp and spasm: Secondary | ICD-10-CM | POA: Diagnosis present

## 2020-07-28 DIAGNOSIS — G8250 Quadriplegia, unspecified: Secondary | ICD-10-CM | POA: Diagnosis not present

## 2020-07-28 DIAGNOSIS — M7918 Myalgia, other site: Secondary | ICD-10-CM | POA: Insufficient documentation

## 2020-07-28 MED ORDER — GABAPENTIN 600 MG PO TABS: 600.0000 mg | ORAL_TABLET | Freq: Three times a day (TID) | ORAL | 5 refills | Status: DC

## 2020-07-28 NOTE — Progress Notes (Signed)
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    Pt here for trigger point injections and f/u on SCI.    Hasn't seen PCP in more than 2+ months.  Has Glipizide 10 mg- got last refill 07/18/20- doesn't need refills.  He does does need gabapentin refill from me.   Having more pain- injections have worn off.  Usually last 6-8 weeks- but is DUE! Since starting PT, feeling a little more relaxed.     Plan: Refill Gabapentin 600 mg TID_ is due- 6 months supply  Doesn't need refills on Baclofen, Eliquis, or Prozac- will con't these meds.  3..went over why has more pain at night- due to less busy signaling pathways-  4. Patient here for trigger point injections for  Consent done and on chart.  Cleaned areas with alcohol and injected using a 27 gauge 1.5 inch needle  Injected 4.5cc Using 1% Lidocaine with no EPI  Upper traps B/L  Levators B/L  Posterior scalenes B/L  Middle scalenes Splenius Capitus B/L  Pectoralis Major- B/L  Rhomboids Infraspinatus Teres Major/minor- B/L  Thoracic paraspinals Lumbar paraspinals Other injections- B/L deltoids    Patient's level of pain prior was 6/10 Current level of pain after injections is 4/10  There was no bleeding or complications.  Patient was advised to drink a lot of water on day after injections to flush system Will have increased soreness for 12-48 hours after injections.  Can use Lidocaine patches the day AFTER injections Can use theracane on day of injections in places didn't inject Can use heating pad 4-6 hours AFTER injections 5. F/U in  2 months - for f/u and TrP injections.

## 2020-07-28 NOTE — Patient Instructions (Signed)
Plan: Refill Gabapentin 600 mg TID_ is due- 6 months supply  Doesn't need refills on Baclofen, Eliquis, or Prozac- will con't these meds.  3..went over why has more pain at night- due to less busy signaling pathways-  4. Patient here for trigger point injections for  Consent done and on chart.  Cleaned areas with alcohol and injected using a 27 gauge 1.5 inch needle  Injected 4.5cc Using 1% Lidocaine with no EPI  Upper traps B/L  Levators B/L  Posterior scalenes B/L  Middle scalenes Splenius Capitus B/L  Pectoralis Major- B/L  Rhomboids Infraspinatus Teres Major/minor- B/L  Thoracic paraspinals Lumbar paraspinals Other injections- B/L deltoids    Patient's level of pain prior was 6/10 Current level of pain after injections is 4/10  There was no bleeding or complications.  Patient was advised to drink a lot of water on day after injections to flush system Will have increased soreness for 12-48 hours after injections.  Can use Lidocaine patches the day AFTER injections Can use theracane on day of injections in places didn't inject Can use heating pad 4-6 hours AFTER injections 5. F/U in  2 months - for f/u and TrP injections.

## 2020-08-01 ENCOUNTER — Other Ambulatory Visit: Payer: Self-pay

## 2020-08-01 ENCOUNTER — Ambulatory Visit: Payer: Medicaid Other

## 2020-08-01 DIAGNOSIS — M25512 Pain in left shoulder: Secondary | ICD-10-CM

## 2020-08-01 DIAGNOSIS — M6281 Muscle weakness (generalized): Secondary | ICD-10-CM

## 2020-08-01 DIAGNOSIS — G8929 Other chronic pain: Secondary | ICD-10-CM

## 2020-08-01 DIAGNOSIS — M25611 Stiffness of right shoulder, not elsewhere classified: Secondary | ICD-10-CM

## 2020-08-01 DIAGNOSIS — G8253 Quadriplegia, C5-C7 complete: Secondary | ICD-10-CM

## 2020-08-01 DIAGNOSIS — M25612 Stiffness of left shoulder, not elsewhere classified: Secondary | ICD-10-CM

## 2020-08-01 NOTE — Therapy (Signed)
OUTPATIENT PHYSICAL THERAPY TREATMENT NOTE   [name] MRN: [id number] DOB:Apr 22, 1965, 55 y.o., male Today's Date: 08/01/2020  PCP: [facility] REFERRING PROVIDER: Genice Rouge, MD   PT End of Session - 08/01/20 1151     Visit Number 4    Number of Visits 9    Date for PT Re-Evaluation 09/06/20    PT Start Time 1100    PT Stop Time 1145    PT Time Calculation (min) 45 min    Activity Tolerance Patient tolerated treatment well    Behavior During Therapy [facility] for tasks assessed/performed              Past Medical History:  Diagnosis Date   Diabetes mellitus without complication (HCC)    History of cervical fracture    Past Surgical History:  Procedure Laterality Date   CERVICAL SPINE SURGERY     Patient Active Problem List   Diagnosis Date Noted   Acute metabolic encephalopathy 04/26/2020   Pneumonia 04/26/2020   DM2 (diabetes mellitus, type 2) (HCC) 04/26/2020   History of DVT (deep vein thrombosis) 04/26/2020   Acute lower UTI 04/26/2020   Community acquired pneumonia of left lower lobe of lung 04/26/2020   At risk for unstable body temperature 11/26/2019   Wheelchair dependence 11/26/2019   Pressure ulcer 09/22/2019   Cellulitis 09/22/2019   Hypokalemia 09/22/2019   Spasticity 03/08/2019   Erectile dysfunction due to diseases classified elsewhere 03/08/2019   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/22/2019   Neurogenic bowel 01/22/2019   Neurogenic bladder 01/22/2019   Myofascial pain 01/22/2019   Spinal cord injury at T1-T6 level (HCC) 12/30/2018   Quadriplegia (HCC) 12/30/2018    ONSET DATE: 06/12/20   REFERRING DIAG: G82.50 (ICD-10-CM) - Quadriplegia (HCC)    THERAPY DIAG:  Muscle weakness (generalized)  Stiffness of left shoulder, not elsewhere classified  Stiffness of right shoulder, not elsewhere classified  Chronic right shoulder pain  Chronic left shoulder pain  Quadriplegia, C5-C7 complete  (HCC)  PERTINENT HISTORY: C7 SCI    PRECAUTIONS: none  SUBJECTIVE: Pt reports no adverse soreness after last session.   PAIN:  Are you having pain? Yes VAS scale: 5/10 Pain location: bil shoulders, Also medial forearm into pinky and ring finger on R arm. Pain orientation: Right and Left  PAIN TYPE: aching and tingling Pain description: intermittent      OBJECTIVE:   TODAY'S TREATMENT: 08/01/20 Soft tissue massage to bil upper trap and periscapular region Posterior capsule stretch: bil passively Seated fwd reach: Active, AA and passive: 10x total  Seated trunk twists: passive: 5x Seated shoulder pulleys into flexion: 20x , abduction: 10x Seated rows:worked to fatigue. 40lbs x 8 bil 30lbs x 11 bil 20lbs x 15 bil Diagonal chops: top to bottom: 10lbs 10x R and L  07/25/20: Soft tissue mobilization to bil upper trap and cervical paraspinalis PROM  of bil shoulders into flexion and horizontal adduction ffor posterior capsule stretch bil Seated AA lumbar flexion with both hands on table in front of him: 15x Bil seated rows: 35 lbs bil:  pt cued to engage paraspinalis for back stabilization and asked to lean forward to initiate pull to engage core.: 35lbs x 15 Bil lat pull downs: 35lbs: 15x, glove required for L hand as pt kept loosing grip on that hand Bil seated chest press: 20lbs 15x, glove required on L hand   07/18/20: Soft tissue mobilization to bil upper trap and cervical paraspinalis PROM  of  bil shoulders into flexion and horizontal abduction from pec stretch Sit to stand and bac to sitting: using standing frame: max to total A x 2 Standing in standing frame for 5' Seated biceps curs: 5lbs 20x R and L Seated OH press: 5lbs 2 x 5 R and L  PATIENT EDUCATION: Education details: Discussed finding a gym before discharge in PT where he can go and do similar exercises to improve and maintain UE strength. Pt educated to assist his wife with her daily chores to improve his  independence, reduce caregiver burden and improve his functional use of his UE and core.  Person educated: Patient, Spouse and translator Education method: Explanation Education comprehension: verbalized understanding     HOME EXERCISE PROGRAM: Access Code [email or web address]   ASSESSMENT:   CLINICAL IMPRESSION: Today's skilled session was focused and working on core strength. Pt able to progress more weight with resistance of rowing. Patient able to stretch arms indpendently with shoulder pulleys. Updated HEP and gave patient instructions on where to purchase shouler pulleys for independent stretching of bil shoulders.  REHAB POTENTIAL: Fair SCI level   CLINICAL DECISION MAKING: Stable/uncomplicated   EVALUATION COMPLEXITY: Low     GOALS: Goals reviewed with patient? Yes   SHORT TERM GOALS:   STG Name Target Date Goal status  1 Pt will assist his wife with 3 tasks to be an active participant in managing household and to improve I and reduce caregiver burden. Baseline: 07/05/20 Wife helps with him with most tasks. 08/02/2020   INITIAL    LONG TERM GOALS:    LTG Name Target Date Goal status  1 Pt will be able to maintain 5 min of unsupported sitting balance at edge of bed to improve trunk control with dynamic activities. Baseline: 07/05/20 Unable to sit unsupported 08/30/2020   INITIAL  2 Pt will be able to use arm bike for at least 20 min daily to improve UE control and endurance.   Baseline: 07/05/20- pt educated to purchase the arm bike (sits on table) 08/30/2020   INITIAL  3 Pt will demo at least 160 deg of shoulder flexion and 135 deg of shoulder abduction bil to be able to reach in [facility] cabinets from his power chair. Baseline: 07/05/20: 145 deg flexion and 90 deg abduction bil 08/30/2020   INITIAL    PLAN: PT FREQUENCY: 1x/week   PT DURATION: 8 weeks   PLANNED INTERVENTIONS: Therapeutic exercises, Therapeutic activity, Neuro Muscular re-education, Patient/Family education, Joint  mobilization, Wheelchair mobility training, Cryotherapy, Moist heat and Manual therapy   PLAN FOR NEXT SESSION: Work on bil shoulder ROM (pulleys?); work on unsupported sitting; use tabletop arm bike, try standing frame.      Ileana Ladd, PT 08/01/2020, 11:52 AM    [facility] [address] [place], [place], [address] Phone: [phone]   Fax:  [phone]  Patient name: Tonny Isensee MRN: [id number] DOB: [birth year]

## 2020-08-08 ENCOUNTER — Ambulatory Visit: Payer: Medicaid Other

## 2020-08-08 ENCOUNTER — Other Ambulatory Visit: Payer: Self-pay

## 2020-08-08 DIAGNOSIS — M25612 Stiffness of left shoulder, not elsewhere classified: Secondary | ICD-10-CM

## 2020-08-08 DIAGNOSIS — M25611 Stiffness of right shoulder, not elsewhere classified: Secondary | ICD-10-CM

## 2020-08-08 DIAGNOSIS — G8253 Quadriplegia, C5-C7 complete: Secondary | ICD-10-CM

## 2020-08-08 DIAGNOSIS — G8929 Other chronic pain: Secondary | ICD-10-CM

## 2020-08-08 DIAGNOSIS — M25512 Pain in left shoulder: Secondary | ICD-10-CM

## 2020-08-08 DIAGNOSIS — M6281 Muscle weakness (generalized): Secondary | ICD-10-CM

## 2020-08-08 DIAGNOSIS — G8252 Quadriplegia, C1-C4 incomplete: Secondary | ICD-10-CM

## 2020-08-08 NOTE — Therapy (Signed)
OUTPATIENT PHYSICAL THERAPY TREATMENT NOTE   [name] MRN: [id number] DOB:04/29/1965, 55 y.o., male Today's Date: 08/08/2020  PCP: [facility], [facility] REFERRING PROVIDER: Genice Rouge, MD   PT End of Session - 08/08/20 1121     Visit Number 5    Number of Visits 9    Date for PT Re-Evaluation 09/06/20    PT Start Time 1105    PT Stop Time 1145    PT Time Calculation (min) 40 min    Activity Tolerance Patient tolerated treatment well    Behavior During Therapy [facility] for tasks assessed/performed              Past Medical History:  Diagnosis Date   Diabetes mellitus without complication (HCC)    History of cervical fracture    Past Surgical History:  Procedure Laterality Date   CERVICAL SPINE SURGERY     Patient Active Problem List   Diagnosis Date Noted   Acute metabolic encephalopathy 04/26/2020   Pneumonia 04/26/2020   DM2 (diabetes mellitus, type 2) (HCC) 04/26/2020   History of DVT (deep vein thrombosis) 04/26/2020   Acute lower UTI 04/26/2020   Community acquired pneumonia of left lower lobe of lung 04/26/2020   At risk for unstable body temperature 11/26/2019   Wheelchair dependence 11/26/2019   Pressure ulcer 09/22/2019   Cellulitis 09/22/2019   Hypokalemia 09/22/2019   Spasticity 03/08/2019   Erectile dysfunction due to diseases classified elsewhere 03/08/2019   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/22/2019   Neurogenic bowel 01/22/2019   Neurogenic bladder 01/22/2019   Myofascial pain 01/22/2019   Spinal cord injury at T1-T6 level (HCC) 12/30/2018   Quadriplegia (HCC) 12/30/2018    ONSET DATE: 06/12/20   REFERRING DIAG: G82.50 (ICD-10-CM) - Quadriplegia (HCC)    THERAPY DIAG:  Muscle weakness (generalized)  Stiffness of left shoulder, not elsewhere classified  Stiffness of right shoulder, not elsewhere classified  Chronic right shoulder pain  Chronic left shoulder pain  Quadriplegia, C5-C7 complete  (HCC)  Quadriplegia, C1-C4 incomplete (HCC)  PERTINENT HISTORY: C7 SCI    PRECAUTIONS: none  SUBJECTIVE: Pt reports no adverse soreness after last session.   PAIN:  Are you having pain? Yes VAS scale: 5/10 Pain location: bil shoulders, Also medial forearm into pinky and ring finger on R arm. Pain orientation: Right and Left  PAIN TYPE: aching and tingling Pain description: intermittent      OBJECTIVE:   TODAY'S TREATMENT: 08/08/20 Soft tissue massage to bil upper trap and periscapular region Posterior capsule stretch: bil passively Gentle towel stretch for cervical rotations: 10 x 10" holds R and L Pt educated to lift elbows off arm rest often throughout the day to reduce compression through ulnar nerve on medial elbow. Pulleys: flexion and abduction: 10x Seated rows: 45lbs 12x, 30lbs 20x bil  08/01/20 Soft tissue massage to bil upper trap and periscapular region Posterior capsule stretch: bil passively Seated fwd reach: Active, AA and passive: 10x total  Seated trunk twists: passive: 5x Seated shoulder pulleys into flexion: 20x , abduction: 10x Seated rows:worked to fatigue. 40lbs x 8 bil 30lbs x 11 bil 20lbs x 15 bil Diagonal chops: top to bottom: 10lbs 10x R and L  07/25/20: Soft tissue mobilization to bil upper trap and cervical paraspinalis PROM  of bil shoulders into flexion and horizontal adduction ffor posterior capsule stretch bil Seated AA lumbar flexion with both hands on table in front of him: 15x Bil seated rows: 35 lbs bil:  pt  cued to engage paraspinalis for back stabilization and asked to lean forward to initiate pull to engage core.: 35lbs x 15 Bil lat pull downs: 35lbs: 15x, glove required for L hand as pt kept loosing grip on that hand Bil seated chest press: 20lbs 15x, glove required on L hand   07/18/20: Soft tissue mobilization to bil upper trap and cervical paraspinalis PROM  of bil shoulders into flexion and horizontal abduction from pec  stretch Sit to stand and bac to sitting: using standing frame: max to total A x 2 Standing in standing frame for 5' Seated biceps curs: 5lbs 20x R and L Seated OH press: 5lbs 2 x 5 R and L  PATIENT EDUCATION: Education details: Discussed finding a gym before discharge in PT where he can go and do similar exercises to improve and maintain UE strength. Pt educated to assist his wife with her daily chores to improve his independence, reduce caregiver burden and improve his functional use of his UE and core.  Person educated: Patient, Spouse and translator Education method: Explanation Education comprehension: verbalized understanding     HOME EXERCISE PROGRAM: Access Code [email or web address]   ASSESSMENT:   CLINICAL IMPRESSION: Pt's AROM in bil shoulders is improving and reporting decreasing neck pain throughout the day. Patient has ordered pulley for shoulder ROM for home use. Patient is reporting compliance with HEP and being more helpful with helping his wife with chores as much as he can.  REHAB POTENTIAL: Fair SCI level   CLINICAL DECISION MAKING: Stable/uncomplicated   EVALUATION COMPLEXITY: Low     GOALS: Goals reviewed with patient? Yes   SHORT TERM GOALS:   STG Name Target Date Goal status  1 Pt will assist his wife with 3 tasks to be an active participant in managing household and to improve I and reduce caregiver burden. Baseline: 07/05/20 Wife helps with him with most tasks. 08/02/2020   INITIAL    LONG TERM GOALS:    LTG Name Target Date Goal status  1 Pt will be able to maintain 5 min of unsupported sitting balance at edge of bed to improve trunk control with dynamic activities. Baseline: 07/05/20 Unable to sit unsupported 08/30/2020   INITIAL  2 Pt will be able to use arm bike for at least 20 min daily to improve UE control and endurance.   Baseline: 07/05/20- pt educated to purchase the arm bike (sits on table) 08/30/2020   INITIAL  3 Pt will demo at least 160 deg of  shoulder flexion and 135 deg of shoulder abduction bil to be able to reach in [facility] cabinets from his power chair. Baseline: 07/05/20: 145 deg flexion and 90 deg abduction bil 08/30/2020   INITIAL    PLAN: PT FREQUENCY: 1x/week   PT DURATION: 8 weeks   PLANNED INTERVENTIONS: Therapeutic exercises, Therapeutic activity, Neuro Muscular re-education, Patient/Family education, Joint mobilization, Wheelchair mobility training, Cryotherapy, Moist heat and Manual therapy   PLAN FOR NEXT SESSION: Work on bil shoulder ROM (pulleys?); work on unsupported sitting; use tabletop arm bike, try standing frame.      Ileana Ladd, PT 08/08/2020, 11:21 AM    [facility] [address] [place], [place], [address] Phone: [phone]   Fax:  [phone]  [name] MRN: [id number] DOB: Nov 28, 1965

## 2020-08-15 ENCOUNTER — Ambulatory Visit: Payer: Medicaid Other | Attending: Physical Medicine and Rehabilitation

## 2020-08-15 ENCOUNTER — Other Ambulatory Visit: Payer: Self-pay

## 2020-08-15 DIAGNOSIS — M25512 Pain in left shoulder: Secondary | ICD-10-CM | POA: Diagnosis present

## 2020-08-15 DIAGNOSIS — M25612 Stiffness of left shoulder, not elsewhere classified: Secondary | ICD-10-CM | POA: Diagnosis present

## 2020-08-15 DIAGNOSIS — G8253 Quadriplegia, C5-C7 complete: Secondary | ICD-10-CM | POA: Insufficient documentation

## 2020-08-15 DIAGNOSIS — M6281 Muscle weakness (generalized): Secondary | ICD-10-CM | POA: Insufficient documentation

## 2020-08-15 DIAGNOSIS — M25611 Stiffness of right shoulder, not elsewhere classified: Secondary | ICD-10-CM | POA: Insufficient documentation

## 2020-08-15 DIAGNOSIS — M25511 Pain in right shoulder: Secondary | ICD-10-CM | POA: Insufficient documentation

## 2020-08-15 DIAGNOSIS — G8929 Other chronic pain: Secondary | ICD-10-CM | POA: Insufficient documentation

## 2020-08-15 DIAGNOSIS — R293 Abnormal posture: Secondary | ICD-10-CM | POA: Insufficient documentation

## 2020-08-15 NOTE — Therapy (Signed)
OUTPATIENT PHYSICAL THERAPY TREATMENT NOTE   [name] MRN: [id number] DOB:November 08, 1965, 55 y.o., male Today's Date: 08/15/2020  PCP: Medicine, [place] PROVIDER: Courtney Heys, MD   PT End of Session - 08/15/20 1104     Visit Number 6    Number of Visits 9    Date for PT Re-Evaluation 09/06/20    PT Start Time 80    PT Stop Time 2924    PT Time Calculation (min) 45 min    Activity Tolerance Patient tolerated treatment well    Behavior During Therapy [facility] for tasks assessed/performed              Past Medical History:  Diagnosis Date   Diabetes mellitus without complication ([place])    History of cervical fracture    Past Surgical History:  Procedure Laterality Date   CERVICAL SPINE SURGERY     Patient Active Problem List   Diagnosis Date Noted   Acute metabolic encephalopathy 46/28/6381   Pneumonia 04/26/2020   DM2 (diabetes mellitus, type 2) ([place]) 04/26/2020   History of DVT (deep vein thrombosis) 04/26/2020   Acute lower UTI 04/26/2020   Community acquired pneumonia of left lower lobe of lung 04/26/2020   At risk for unstable body temperature 11/26/2019   Wheelchair dependence 11/26/2019   Pressure ulcer 09/22/2019   Cellulitis 09/22/2019   Hypokalemia 09/22/2019   Spasticity 03/08/2019   Erectile dysfunction due to diseases classified elsewhere 03/08/2019   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/22/2019   Neurogenic bowel 01/22/2019   Neurogenic bladder 01/22/2019   Myofascial pain 01/22/2019   Spinal cord injury at T1-T6 level ([place]) 12/30/2018   Quadriplegia ([place]) 12/30/2018    ONSET DATE: 06/12/20   REFERRING DIAG: G82.50 (ICD-10-CM) - Quadriplegia ([place])    THERAPY DIAG:  Muscle weakness (generalized)  Stiffness of left shoulder, not elsewhere classified  Stiffness of right shoulder, not elsewhere classified  Chronic right shoulder pain  Chronic left shoulder pain  Quadriplegia, C5-C7 complete  ([place])  PERTINENT HISTORY: C7 SCI    PRECAUTIONS: none  SUBJECTIVE: Pt reports no adverse soreness after last session.   PAIN:  Are you having pain? Yes VAS scale: 5/10 Pain location: bil shoulders, Also medial forearm into pinky and ring finger on R arm. Pain orientation: Right and Left  PAIN TYPE: aching and tingling Pain description: intermittent      OBJECTIVE:   TODAY'S TREATMENT: 08/15/20: SciFit: level 5 for UE only for 10' Bil seated rows: 55lbs 3 x 5, used glove for L grip Bil lat pull downs: 55lbs 3 x 5, used glove for L grip Soft tissue massage to bil upper trap and periscapular region, clavicular portions of pecs bil Passive pec stretch bil   08/08/20 Soft tissue massage to bil upper trap and periscapular region Posterior capsule stretch: bil passively Gentle towel stretch for cervical rotations: 10 x 10" holds R and L Pt educated to lift elbows off arm rest often throughout the day to reduce compression through ulnar nerve on medial elbow. Pulleys: flexion and abduction: 10x Seated rows: 45lbs 12x, 30lbs 20x bil  08/01/20 Soft tissue massage to bil upper trap and periscapular region Posterior capsule stretch: bil passively Seated fwd reach: Active, AA and passive: 10x total  Seated trunk twists: passive: 5x Seated shoulder pulleys into flexion: 20x , abduction: 10x Seated rows:worked to fatigue. 40lbs x 8 bil 30lbs x 11 bil 20lbs x 15 bil Diagonal chops: top to bottom: 10lbs 10x R and  L  07/25/20: Soft tissue mobilization to bil upper trap and cervical paraspinalis PROM  of bil shoulders into flexion and horizontal adduction ffor posterior capsule stretch bil Seated AA lumbar flexion with both hands on table in front of him: 15x Bil seated rows: 35 lbs bil:  pt cued to engage paraspinalis for back stabilization and asked to lean forward to initiate pull to engage core.: 35lbs x 15 Bil lat pull downs: 35lbs: 15x, glove required for L hand as pt kept  loosing grip on that hand Bil seated chest press: 20lbs 15x, glove required on L hand   07/18/20: Soft tissue mobilization to bil upper trap and cervical paraspinalis PROM  of bil shoulders into flexion and horizontal abduction from pec stretch Sit to stand and bac to sitting: using standing frame: max to total A x 2 Standing in standing frame for 5' Seated biceps curs: 5lbs 20x R and L Seated OH press: 5lbs 2 x 5 R and L  PATIENT EDUCATION: Education details: Pt and wife educated to get long reach [employer] farm [employer] so Markanthony can use it to dust at home. This will make him reach his arm OH and actively use his UE for reaching. He will also have to reach which will work on core strength and trunk extension. It will also make him use his cervical spine ROM more actively and functionally.  Person educated: Patient, Spouse and translator Education method: Explanation Education comprehension: verbalized understanding     HOME EXERCISE PROGRAM: Access Code [email or web address]   ASSESSMENT:   CLINICAL IMPRESSION: Pt able to perform rows and lat pull downs without excessive back support from chair today. Patient is demonstrating improving core and lumbar extensor strength. Met STG today.  REHAB POTENTIAL: Fair SCI level   CLINICAL DECISION MAKING: Stable/uncomplicated   EVALUATION COMPLEXITY: Low     GOALS: Goals reviewed with patient? Yes   SHORT TERM GOALS:   STG Name Target Date Goal status  1 Pt will assist his wife with 3 tasks to be an active participant in managing household and to improve I and reduce caregiver burden. Baseline: 07/05/20 Wife helps with him with most tasks. Status: Pt helps with chopping vegetables, making salsa, mixing ingrediants. 08/02/2020   Goal met  08/15/20    LONG TERM GOALS:    LTG Name Target Date Goal status  1 Pt will be able to maintain 5 min of unsupported sitting balance at edge of bed to improve trunk control with dynamic activities. Baseline: 07/05/20  Unable to sit unsupported 08/30/2020   INITIAL  2 Pt will be able to use arm bike for at least 20 min daily to improve UE control and endurance.   Baseline: 07/05/20- pt educated to purchase the arm bike (sits on table) 08/30/2020   INITIAL  3 Pt will demo at least 160 deg of shoulder flexion and 135 deg of shoulder abduction bil to be able to reach in [facility] cabinets from his power chair. Baseline: 07/05/20: 145 deg flexion and 90 deg abduction bil 08/30/2020   INITIAL    PLAN: PT FREQUENCY: 1x/week   PT DURATION: 8 weeks   PLANNED INTERVENTIONS: Therapeutic exercises, Therapeutic activity, Neuro Muscular re-education, Patient/Family education, Joint mobilization, Wheelchair mobility training, Cryotherapy, Moist heat and Manual therapy   PLAN FOR NEXT SESSION: Continue to work with pulleys to develop UE strength, core strength lumbar extension strength      Kerrie Pleasure, PT 08/15/2020, 11:05 AM     [facility] [address] [place], [place], [address] Phone: [phone]   Fax:  [phone]  [name] MRN: [id number] DOB: [birth year]

## 2020-08-23 ENCOUNTER — Ambulatory Visit: Payer: Medicaid Other

## 2020-08-30 ENCOUNTER — Other Ambulatory Visit: Payer: Self-pay

## 2020-08-30 ENCOUNTER — Ambulatory Visit: Payer: Medicaid Other

## 2020-08-30 DIAGNOSIS — G8929 Other chronic pain: Secondary | ICD-10-CM

## 2020-08-30 DIAGNOSIS — M6281 Muscle weakness (generalized): Secondary | ICD-10-CM | POA: Diagnosis not present

## 2020-08-30 DIAGNOSIS — R293 Abnormal posture: Secondary | ICD-10-CM

## 2020-08-30 DIAGNOSIS — M25612 Stiffness of left shoulder, not elsewhere classified: Secondary | ICD-10-CM

## 2020-08-30 DIAGNOSIS — M25611 Stiffness of right shoulder, not elsewhere classified: Secondary | ICD-10-CM

## 2020-08-30 DIAGNOSIS — M25512 Pain in left shoulder: Secondary | ICD-10-CM

## 2020-08-30 DIAGNOSIS — G8253 Quadriplegia, C5-C7 complete: Secondary | ICD-10-CM

## 2020-08-30 DIAGNOSIS — M25511 Pain in right shoulder: Secondary | ICD-10-CM

## 2020-08-30 NOTE — Therapy (Signed)
OUTPATIENT PHYSICAL THERAPY TREATMENT NOTE   [name] MRN: [id number] DOB:09-17-1965, 55 y.o., male Today's Date: 08/30/2020  PCP: [facility], [place] PROVIDER: Courtney Heys, MD   PT End of Session - 08/30/20 1103     Visit Number 7    Number of Visits 9    Date for PT Re-Evaluation 09/06/20    PT Start Time 40    PT Stop Time 1017    PT Time Calculation (min) 45 min    Activity Tolerance Patient tolerated treatment well    Behavior During Therapy [facility] for tasks assessed/performed              Past Medical History:  Diagnosis Date   Diabetes mellitus without complication ([place])    History of cervical fracture    Past Surgical History:  Procedure Laterality Date   CERVICAL SPINE SURGERY     Patient Active Problem List   Diagnosis Date Noted   Acute metabolic encephalopathy 51/03/5850   Pneumonia 04/26/2020   DM2 (diabetes mellitus, type 2) ([place]) 04/26/2020   History of DVT (deep vein thrombosis) 04/26/2020   Acute lower UTI 04/26/2020   Community acquired pneumonia of left lower lobe of lung 04/26/2020   At risk for unstable body temperature 11/26/2019   Wheelchair dependence 11/26/2019   Pressure ulcer 09/22/2019   Cellulitis 09/22/2019   Hypokalemia 09/22/2019   Spasticity 03/08/2019   Erectile dysfunction due to diseases classified elsewhere 03/08/2019   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/22/2019   Neurogenic bowel 01/22/2019   Neurogenic bladder 01/22/2019   Myofascial pain 01/22/2019   Spinal cord injury at T1-T6 level ([place]) 12/30/2018   Quadriplegia ([place]) 12/30/2018    ONSET DATE: 06/12/20   REFERRING DIAG: G82.50 (ICD-10-CM) - Quadriplegia ([place])    THERAPY DIAG:  Muscle weakness (generalized)  Stiffness of left shoulder, not elsewhere classified  Stiffness of right shoulder, not elsewhere classified  Chronic right shoulder pain  Chronic left shoulder pain  Quadriplegia, C5-C7 complete  (HCC)  Abnormal posture  PERTINENT HISTORY: C7 SCI    PRECAUTIONS: none  SUBJECTIVE: Pt reports overall, I am feeling less pain in my shoulders. I tried little bit of gardening also past week PAIN:  Are you having pain? Yes VAS scale: 5/10 Pain location: bil shoulders, Also medial forearm into pinky and ring finger on R arm. Pain orientation: Right and Left  PAIN TYPE: aching and tingling Pain description: intermittent      OBJECTIVE:   TODAY'S TREATMENT: 08/30/20: Manual therapy: Soft tissue mob to bil upper trap, pectorals Doorway pec stretch in wheelchair: 3 x 30"  With below exercises pts core supported in unsupported position off back rest with min to mod A Bil rows: 45lb L glove, 2 x 10 bil Chest press: 35lbs 2 x 10 R and L uni Lat pull downs: 55lbs 2 x 10 bil I  08/15/20: SciFit: level 5 for UE only for 10' Bil seated rows: 55lbs 3 x 5, used glove for L grip Bil lat pull downs: 55lbs 3 x 5, used glove for L grip Soft tissue massage to bil upper trap and periscapular region, clavicular portions of pecs bil Passive pec stretch bil   08/08/20 Soft tissue massage to bil upper trap and periscapular region Posterior capsule stretch: bil passively Gentle towel stretch for cervical rotations: 10 x 10" holds R and L Pt educated to lift elbows off arm rest often throughout the day to reduce compression through ulnar nerve on medial  elbow. Pulleys: flexion and abduction: 10x Seated rows: 45lbs 12x, 30lbs 20x bil  08/01/20 Soft tissue massage to bil upper trap and periscapular region Posterior capsule stretch: bil passively Seated fwd reach: Active, AA and passive: 10x total  Seated trunk twists: passive: 5x Seated shoulder pulleys into flexion: 20x , abduction: 10x Seated rows:worked to fatigue. 40lbs x 8 bil 30lbs x 11 bil 20lbs x 15 bil Diagonal chops: top to bottom: 10lbs 10x R and L  07/25/20: Soft tissue mobilization to bil upper trap and cervical  paraspinalis PROM  of bil shoulders into flexion and horizontal adduction ffor posterior capsule stretch bil Seated AA lumbar flexion with both hands on table in front of him: 15x Bil seated rows: 35 lbs bil:  pt cued to engage paraspinalis for back stabilization and asked to lean forward to initiate pull to engage core.: 35lbs x 15 Bil lat pull downs: 35lbs: 15x, glove required for L hand as pt kept loosing grip on that hand Bil seated chest press: 20lbs 15x, glove required on L hand   07/18/20: Soft tissue mobilization to bil upper trap and cervical paraspinalis PROM  of bil shoulders into flexion and horizontal abduction from pec stretch Sit to stand and bac to sitting: using standing frame: max to total A x 2 Standing in standing frame for 5' Seated biceps curs: 5lbs 20x R and L Seated OH press: 5lbs 2 x 5 R and L  PATIENT EDUCATION: Education details: Information give regarding [facility] upon patient's request and help of interpreter. Person educated: Patient, Spouse and translator Education method: Explanation Education comprehension: verbalized understanding     HOME EXERCISE PROGRAM: Access Code [email or web address]   ASSESSMENT:   CLINICAL IMPRESSION: Today's skilled session was focused on cotninued soft tissue work to bil shoulders and neck to improve pain, improving shoulder flexibility and continuing to progress core strengthening and UE strengthening. Progressed pulley exercises in more unsupported back to challenge core muscles.  REHAB POTENTIAL: Fair SCI level   CLINICAL DECISION MAKING: Stable/uncomplicated   EVALUATION COMPLEXITY: Low     GOALS: Goals reviewed with patient? Yes   SHORT TERM GOALS:   STG Name Target Date Goal status  1 Pt will assist his wife with 3 tasks to be an active participant in managing household and to improve I and reduce caregiver burden. Baseline: 07/05/20 Wife helps with him with most tasks. Status: Pt helps with  chopping vegetables, making salsa, mixing ingrediants. 08/02/2020   Goal met  08/15/20    LONG TERM GOALS:    LTG Name Target Date Goal status  1 Pt will be able to maintain 5 min of unsupported sitting balance at edge of bed to improve trunk control with dynamic activities. Baseline: 07/05/20 Unable to sit unsupported 08/30/2020   INITIAL  2 Pt will be able to use arm bike for at least 20 min daily to improve UE control and endurance.   Baseline: 07/05/20- pt educated to purchase the arm bike (sits on table) 08/30/2020   INITIAL  3 Pt will demo at least 160 deg of shoulder flexion and 135 deg of shoulder abduction bil to be able to reach in [facility] cabinets from his power chair. Baseline: 07/05/20: 145 deg flexion and 90 deg abduction bil 08/30/2020   INITIAL    PLAN: PT FREQUENCY: 1x/week   PT DURATION: 8 weeks   PLANNED INTERVENTIONS: Therapeutic exercises, Therapeutic activity, Neuro Muscular re-education, Patient/Family education, Joint mobilization, Wheelchair mobility training, Cryotherapy, Moist heat  and Manual therapy   PLAN FOR NEXT SESSION: Continue to work with pulleys to develop UE strength, core strength lumbar extension strength      Kerrie Pleasure, PT 08/30/2020, 11:38 AM    [facility] [address] [place], [place], [address] Phone: [phone]   Fax:  [phone]  [name] MRN: [id number] DOB: 03-30-1965

## 2020-09-06 ENCOUNTER — Other Ambulatory Visit: Payer: Self-pay

## 2020-09-06 ENCOUNTER — Ambulatory Visit: Payer: Medicaid Other

## 2020-09-06 DIAGNOSIS — G8253 Quadriplegia, C5-C7 complete: Secondary | ICD-10-CM

## 2020-09-06 DIAGNOSIS — G8929 Other chronic pain: Secondary | ICD-10-CM

## 2020-09-06 DIAGNOSIS — M25512 Pain in left shoulder: Secondary | ICD-10-CM

## 2020-09-06 DIAGNOSIS — M25612 Stiffness of left shoulder, not elsewhere classified: Secondary | ICD-10-CM

## 2020-09-06 DIAGNOSIS — M6281 Muscle weakness (generalized): Secondary | ICD-10-CM | POA: Diagnosis not present

## 2020-09-06 DIAGNOSIS — M25611 Stiffness of right shoulder, not elsewhere classified: Secondary | ICD-10-CM

## 2020-09-06 NOTE — Therapy (Signed)
OUTPATIENT PHYSICAL THERAPY Re-certification Note   [name] MRN: [id number] DOB:1965-04-15, 55 y.o., male Today's Date: 09/06/2020  PCP: [facility], [place] PROVIDER: Courtney Heys, MD   PT End of Session - 09/06/20 1119     Visit Number 8    Number of Visits 10    Date for PT Re-Evaluation 10/04/20    PT Start Time 8657    PT Stop Time 8469    PT Time Calculation (min) 60 min    Activity Tolerance Patient tolerated treatment well    Behavior During Therapy [facility] for tasks assessed/performed              Past Medical History:  Diagnosis Date   Diabetes mellitus without complication ([place])    History of cervical fracture    Past Surgical History:  Procedure Laterality Date   CERVICAL SPINE SURGERY     Patient Active Problem List   Diagnosis Date Noted   Acute metabolic encephalopathy 62/95/2841   Pneumonia 04/26/2020   DM2 (diabetes mellitus, type 2) ([place]) 04/26/2020   History of DVT (deep vein thrombosis) 04/26/2020   Acute lower UTI 04/26/2020   Community acquired pneumonia of left lower lobe of lung 04/26/2020   At risk for unstable body temperature 11/26/2019   Wheelchair dependence 11/26/2019   Pressure ulcer 09/22/2019   Cellulitis 09/22/2019   Hypokalemia 09/22/2019   Spasticity 03/08/2019   Erectile dysfunction due to diseases classified elsewhere 03/08/2019   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/22/2019   Neurogenic bowel 01/22/2019   Neurogenic bladder 01/22/2019   Myofascial pain 01/22/2019   Spinal cord injury at T1-T6 level ([place]) 12/30/2018   Quadriplegia ([place]) 12/30/2018    ONSET DATE: 06/12/20   REFERRING DIAG: G82.50 (ICD-10-CM) - Quadriplegia ([place])    THERAPY DIAG:  Muscle weakness (generalized)  Stiffness of left shoulder, not elsewhere classified  Stiffness of right shoulder, not elsewhere classified  Chronic right shoulder pain  Chronic left shoulder pain  Quadriplegia, C5-C7  complete ([place])  PERTINENT HISTORY: C7 SCI    PRECAUTIONS: none  SUBJECTIVE: pt reports pain is minimal right now. He has been doing all of his exercises.  PAIN:  Are you having pain? Yes VAS scale: 3/10 Pain location: bil shoulders, Also medial forearm into pinky and ring finger on R arm. Pain orientation: Right and Left  PAIN TYPE: aching and tingling Pain description: intermittent      OBJECTIVE:   TODAY'S TREATMENT: 09/06/20: Manual therapy: Soft tissue mob to bil upper trap, pectorals Doorway pec stretch in wheelchair: 3 x 30"  Seated scalene stretch with cervical lateral flexion: 3 x 30" Seated cervical rotation with overpressure with towel: 10 x 10" Holds R and L Bil rows: 45lb L glove, 2 x 10 bil Chest press: 35lbs 2 x 10 R and L uni Lat pull downs: 55lbs 2 x 10 bil  08/30/20: Manual therapy: Soft tissue mob to bil upper trap, pectorals Doorway pec stretch in wheelchair: 3 x 30"  With below exercises pts core supported in unsupported position off back rest with min to mod A Bil rows: 45lb L glove, 2 x 10 bil Chest press: 35lbs 2 x 10 R and L uni Lat pull downs: 55lbs 2 x 10 bil I  08/15/20: SciFit: level 5 for UE only for 10' Bil seated rows: 55lbs 3 x 5, used glove for L grip Bil lat pull downs: 55lbs 3 x 5, used glove for L grip Soft tissue massage to bil  upper trap and periscapular region, clavicular portions of pecs bil Passive pec stretch bil   08/08/20 Soft tissue massage to bil upper trap and periscapular region Posterior capsule stretch: bil passively Gentle towel stretch for cervical rotations: 10 x 10" holds R and L Pt educated to lift elbows off arm rest often throughout the day to reduce compression through ulnar nerve on medial elbow. Pulleys: flexion and abduction: 10x Seated rows: 45lbs 12x, 30lbs 20x bil  08/01/20 Soft tissue massage to bil upper trap and periscapular region Posterior capsule stretch: bil passively Seated fwd reach: Active,  AA and passive: 10x total  Seated trunk twists: passive: 5x Seated shoulder pulleys into flexion: 20x , abduction: 10x Seated rows:worked to fatigue. 40lbs x 8 bil 30lbs x 11 bil 20lbs x 15 bil Diagonal chops: top to bottom: 10lbs 10x R and L  07/25/20: Soft tissue mobilization to bil upper trap and cervical paraspinalis PROM  of bil shoulders into flexion and horizontal adduction ffor posterior capsule stretch bil Seated AA lumbar flexion with both hands on table in front of him: 15x Bil seated rows: 35 lbs bil:  pt cued to engage paraspinalis for back stabilization and asked to lean forward to initiate pull to engage core.: 35lbs x 15 Bil lat pull downs: 35lbs: 15x, glove required for L hand as pt kept loosing grip on that hand Bil seated chest press: 20lbs 15x, glove required on L hand   07/18/20: Soft tissue mobilization to bil upper trap and cervical paraspinalis PROM  of bil shoulders into flexion and horizontal abduction from pec stretch Sit to stand and bac to sitting: using standing frame: max to total A x 2 Standing in standing frame for 5' Seated biceps curs: 5lbs 20x R and L Seated OH press: 5lbs 2 x 5 R and L  PATIENT EDUCATION: Education details: Information give regarding [facility] upon patient's request and help of interpreter. Person educated: Patient, Spouse and translator Education method: Explanation Education comprehension: verbalized understanding     HOME EXERCISE PROGRAM: Access Code [email or web address]   ASSESSMENT:   CLINICAL IMPRESSION: Patient has been seen for total of 8 sessions due to hx of SCI. Pt has met all of his short term goals and 2/3 of his long term goals. Patient will benefit from skilled PT for 2 more sessions to finalize his home exercise program to improve independent management of his UE strength and core strength.  REHAB POTENTIAL: Fair SCI level   CLINICAL DECISION MAKING: Stable/uncomplicated   EVALUATION  COMPLEXITY: Low     GOALS: Goals reviewed with patient? Yes   SHORT TERM GOALS:   STG Name Target Date Goal status  1 Pt will assist his wife with 3 tasks to be an active participant in managing household and to improve I and reduce caregiver burden. Baseline: 07/05/20 Wife helps with him with most tasks. Status: Pt helps with chopping vegetables, making salsa, mixing ingrediants. 08/02/2020   Goal met  08/15/20    LONG TERM GOALS:    LTG Name Target Date Goal status  1 Pt will be able to maintain 5 min of unsupported sitting balance at edge of bed to improve trunk control with dynamic activities. Baseline: 07/05/20 Unable to sit unsupported 08/30/2020   Not met  2 Pt will be able to use arm bike for at least 20 min daily to improve UE control and endurance.   Baseline: 07/05/20- pt educated to purchase the arm bike (sits on table); pt  is helping wife with various house chores for 30 min (09/06/20) 08/30/2020   Met  3 Pt will demo at least 160 deg of shoulder flexion and 135 deg of shoulder abduction bil to be able to reach in [facility] cabinets from his power chair. Baseline: 07/05/20: 145 deg flexion and 90 deg abduction bil 08/30/2020   Goal met    PLAN: PT FREQUENCY: 1x/week   PT DURATION: 2 more sessions ( in 4 weeks)   PLANNED INTERVENTIONS: Therapeutic exercises, Therapeutic activity, Neuro Muscular re-education, Patient/Family education, Joint mobilization, Wheelchair mobility training, Cryotherapy, Moist heat and Manual therapy   PLAN FOR NEXT SESSION: Continue to work with pulleys to develop UE strength, core strength lumbar extension strength      Kerrie Pleasure, PT 09/06/2020, 11:20 AM    [facility] [address] [place], [place], [address] Phone: [phone]   Fax:  [phone]  [name] MRN: [id number] DOB: 1965/03/12

## 2020-09-13 ENCOUNTER — Ambulatory Visit (HOSPITAL_BASED_OUTPATIENT_CLINIC_OR_DEPARTMENT_OTHER): Payer: Medicaid Other | Attending: Physical Medicine and Rehabilitation | Admitting: Physical Therapy

## 2020-09-13 ENCOUNTER — Other Ambulatory Visit: Payer: Self-pay

## 2020-09-13 DIAGNOSIS — R293 Abnormal posture: Secondary | ICD-10-CM

## 2020-09-13 DIAGNOSIS — M25512 Pain in left shoulder: Secondary | ICD-10-CM

## 2020-09-13 DIAGNOSIS — M25612 Stiffness of left shoulder, not elsewhere classified: Secondary | ICD-10-CM

## 2020-09-13 DIAGNOSIS — G8929 Other chronic pain: Secondary | ICD-10-CM

## 2020-09-13 DIAGNOSIS — M6281 Muscle weakness (generalized): Secondary | ICD-10-CM

## 2020-09-13 DIAGNOSIS — G8250 Quadriplegia, unspecified: Secondary | ICD-10-CM | POA: Insufficient documentation

## 2020-09-13 DIAGNOSIS — G8253 Quadriplegia, C5-C7 complete: Secondary | ICD-10-CM

## 2020-09-13 DIAGNOSIS — M25611 Stiffness of right shoulder, not elsewhere classified: Secondary | ICD-10-CM

## 2020-09-13 NOTE — Therapy (Signed)
OUTPATIENT PHYSICAL THERAPY Treatment   [name] MRN: [id number] DOB:[birth year], 55 y.o., male Today's Date: 09/13/2020  PCP: Medicine, [place] PROVIDER: Courtney Heys, MD      Past Medical History:  Diagnosis Date   Diabetes mellitus without complication ([place])    History of cervical fracture    Past Surgical History:  Procedure Laterality Date   CERVICAL SPINE SURGERY     Patient Active Problem List   Diagnosis Date Noted   Acute metabolic encephalopathy 64/15/8309   Pneumonia 04/26/2020   DM2 (diabetes mellitus, type 2) ([place]) 04/26/2020   History of DVT (deep vein thrombosis) 04/26/2020   Acute lower UTI 04/26/2020   Community acquired pneumonia of left lower lobe of lung 04/26/2020   At risk for unstable body temperature 11/26/2019   Wheelchair dependence 11/26/2019   Pressure ulcer 09/22/2019   Cellulitis 09/22/2019   Hypokalemia 09/22/2019   Spasticity 03/08/2019   Erectile dysfunction due to diseases classified elsewhere 03/08/2019   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/22/2019   Neurogenic bowel 01/22/2019   Neurogenic bladder 01/22/2019   Myofascial pain 01/22/2019   Spinal cord injury at T1-T6 level ([place]) 12/30/2018   Quadriplegia ([place]) 12/30/2018    ONSET DATE: 06/12/20   REFERRING DIAG: G82.50 (ICD-10-CM) - Quadriplegia ([place])    THERAPY DIAG:  Muscle weakness (generalized)  Stiffness of left shoulder, not elsewhere classified  Stiffness of right shoulder, not elsewhere classified  Chronic right shoulder pain  Chronic left shoulder pain  Quadriplegia, C5-C7 complete (HCC)  Abnormal posture  PERTINENT HISTORY: C7 SCI    PRECAUTIONS: none  SUBJECTIVE: feeling good today. With Gwenyth Bouillon we were working on pulling for more core strength. They said MCD did not cover my legs. I cannot feel my legs but I would like to do some machines that make them move. In the last therapy session, they put him on the  nustep which really helped him feel relaxed. We were working on slide board transfers to table.   PAIN:  Are you having pain? Yes VAS scale: 5/10 Pain location: bil UEs Pain orientation: Right and Left  PAIN TYPE: stiff Pain description: intermittent      OBJECTIVE:   TODAY'S TREATMENT:   09/13/2020:   Gym equipment: cables- row, lat pull, triceps, biceps- 10lb   Free weights- deltoid all aspects, OH press 5lb   Trx for use of UE for hip hinge & core activation   09/06/20: Manual therapy: Soft tissue mob to bil upper trap, pectorals Doorway pec stretch in wheelchair: 3 x 30"  Seated scalene stretch with cervical lateral flexion: 3 x 30" Seated cervical rotation with overpressure with towel: 10 x 10" Holds R and L Bil rows: 45lb L glove, 2 x 10 bil Chest press: 35lbs 2 x 10 R and L uni Lat pull downs: 55lbs 2 x 10 bil  08/30/20: Manual therapy: Soft tissue mob to bil upper trap, pectorals Doorway pec stretch in wheelchair: 3 x 30"  With below exercises pts core supported in unsupported position off back rest with min to mod A Bil rows: 45lb L glove, 2 x 10 bil Chest press: 35lbs 2 x 10 R and L uni Lat pull downs: 55lbs 2 x 10 bil I  08/15/20: SciFit: level 5 for UE only for 10' Bil seated rows: 55lbs 3 x 5, used glove for L grip Bil lat pull downs: 55lbs 3 x 5, used glove for L grip Soft tissue massage to bil upper trap  and periscapular region, clavicular portions of pecs bil Passive pec stretch bil   08/08/20 Soft tissue massage to bil upper trap and periscapular region Posterior capsule stretch: bil passively Gentle towel stretch for cervical rotations: 10 x 10" holds R and L Pt educated to lift elbows off arm rest often throughout the day to reduce compression through ulnar nerve on medial elbow. Pulleys: flexion and abduction: 10x Seated rows: 45lbs 12x, 30lbs 20x bil  08/01/20 Soft tissue massage to bil upper trap and periscapular region Posterior capsule  stretch: bil passively Seated fwd reach: Active, AA and passive: 10x total  Seated trunk twists: passive: 5x Seated shoulder pulleys into flexion: 20x , abduction: 10x Seated rows:worked to fatigue. 40lbs x 8 bil 30lbs x 11 bil 20lbs x 15 bil Diagonal chops: top to bottom: 10lbs 10x R and L  07/25/20: Soft tissue mobilization to bil upper trap and cervical paraspinalis PROM  of bil shoulders into flexion and horizontal adduction ffor posterior capsule stretch bil Seated AA lumbar flexion with both hands on table in front of him: 15x Bil seated rows: 35 lbs bil:  pt cued to engage paraspinalis for back stabilization and asked to lean forward to initiate pull to engage core.: 35lbs x 15 Bil lat pull downs: 35lbs: 15x, glove required for L hand as pt kept loosing grip on that hand Bil seated chest press: 20lbs 15x, glove required on L hand   07/18/20: Soft tissue mobilization to bil upper trap and cervical paraspinalis PROM  of bil shoulders into flexion and horizontal abduction from pec stretch Sit to stand and bac to sitting: using standing frame: max to total A x 2 Standing in standing frame for 5' Seated biceps curs: 5lbs 20x R and L Seated OH press: 5lbs 2 x 5 R and L  PATIENT EDUCATION: Education details: Information give regarding [facility] upon patient's request and help of interpreter. Person educated: Patient, Spouse and translator Education method: Explanation Education comprehension: verbalized understanding     HOME EXERCISE PROGRAM: Access Code [email or web address]   ASSESSMENT:   CLINICAL IMPRESSION: Pt transferred care to our clinic for training in gym use for transition to independent gym program. Education provided to wife on use of machines and areas of the building so she will be able to help him. Expressed interest in aquatics as well so I will look into this for him.   REHAB POTENTIAL: Fair SCI level   CLINICAL DECISION MAKING:  Stable/uncomplicated   EVALUATION COMPLEXITY: Low     GOALS: Goals reviewed with patient? Yes   SHORT TERM GOALS:   STG Name Target Date Goal status  1 Pt will assist his wife with 3 tasks to be an active participant in managing household and to improve I and reduce caregiver burden. Baseline: 07/05/20 Wife helps with him with most tasks. Status: Pt helps with chopping vegetables, making salsa, mixing ingrediants. 08/02/2020   Goal met  08/15/20    LONG TERM GOALS:    LTG Name Target Date Goal status  1 Pt will be able to maintain 5 min of unsupported sitting balance at edge of bed to improve trunk control with dynamic activities. Baseline: 07/05/20 Unable to sit unsupported 08/30/2020   Not met  2 Pt will be able to use arm bike for at least 20 min daily to improve UE control and endurance.   Baseline: 07/05/20- pt educated to purchase the arm bike (sits on table); pt is helping wife with various house  chores for 30 min (09/06/20) 08/30/2020   Met  3 Pt will demo at least 160 deg of shoulder flexion and 135 deg of shoulder abduction bil to be able to reach in [facility] cabinets from his power chair. Baseline: 07/05/20: 145 deg flexion and 90 deg abduction bil 08/30/2020   Goal met    PLAN: PT FREQUENCY: 1x/week   PT DURATION: 1 more session   PLANNED INTERVENTIONS: Therapeutic exercises, Therapeutic activity, Neuro Muscular re-education, Patient/Family education, Joint mobilization, Wheelchair mobility training, Cryotherapy, Moist heat and Manual therapy   PLAN FOR NEXT SESSION: discuss aquatics, review gym PRN     Jessica C. Hightower PT, DPT 09/13/20 10:48 AM     [facility] [address] [place], [place], [address] Phone: [phone]   Fax:  [phone]  [name] MRN: [id number] DOB: [birth year]

## 2020-09-18 ENCOUNTER — Ambulatory Visit (HOSPITAL_BASED_OUTPATIENT_CLINIC_OR_DEPARTMENT_OTHER): Payer: Medicaid Other | Admitting: Physical Therapy

## 2020-09-18 ENCOUNTER — Other Ambulatory Visit: Payer: Self-pay

## 2020-09-18 ENCOUNTER — Encounter (HOSPITAL_BASED_OUTPATIENT_CLINIC_OR_DEPARTMENT_OTHER): Payer: Self-pay | Admitting: Physical Therapy

## 2020-09-18 DIAGNOSIS — M25611 Stiffness of right shoulder, not elsewhere classified: Secondary | ICD-10-CM

## 2020-09-18 DIAGNOSIS — G8250 Quadriplegia, unspecified: Secondary | ICD-10-CM | POA: Diagnosis not present

## 2020-09-18 DIAGNOSIS — M25612 Stiffness of left shoulder, not elsewhere classified: Secondary | ICD-10-CM

## 2020-09-18 DIAGNOSIS — M6281 Muscle weakness (generalized): Secondary | ICD-10-CM

## 2020-09-18 NOTE — Therapy (Signed)
OUTPATIENT PHYSICAL THERAPY Treatment/Discharge   [name] MRN: [id number] DOB:1965/11/01, 55 y.o., male Today's Date: 09/18/2020  PCP: Medicine, [place] REFERRING PROVIDER: Medicine, [place]   PT End of Session - 09/18/20 1112     Visit Number 10    Number of Visits 10    Date for PT Re-Evaluation 10/04/20    PT Start Time 1106    PT Stop Time 1505    PT Time Calculation (min) 39 min    Activity Tolerance Patient tolerated treatment well    Behavior During Therapy [facility] for tasks assessed/performed               Past Medical History:  Diagnosis Date   Diabetes mellitus without complication ([place])    History of cervical fracture    Past Surgical History:  Procedure Laterality Date   CERVICAL SPINE SURGERY     Patient Active Problem List   Diagnosis Date Noted   Acute metabolic encephalopathy 69/79/4801   Pneumonia 04/26/2020   DM2 (diabetes mellitus, type 2) ([place]) 04/26/2020   History of DVT (deep vein thrombosis) 04/26/2020   Acute lower UTI 04/26/2020   Community acquired pneumonia of left lower lobe of lung 04/26/2020   At risk for unstable body temperature 11/26/2019   Wheelchair dependence 11/26/2019   Pressure ulcer 09/22/2019   Cellulitis 09/22/2019   Hypokalemia 09/22/2019   Spasticity 03/08/2019   Erectile dysfunction due to diseases classified elsewhere 03/08/2019   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/22/2019   Neurogenic bowel 01/22/2019   Neurogenic bladder 01/22/2019   Myofascial pain 01/22/2019   Spinal cord injury at T1-T6 level ([place]) 12/30/2018   Quadriplegia ([place]) 12/30/2018    ONSET DATE: 06/12/20   REFERRING DIAG: G82.50 (ICD-10-CM) - Quadriplegia ([place])    THERAPY DIAG:  Muscle weakness (generalized)  Stiffness of left shoulder, not elsewhere classified  Stiffness of right shoulder, not elsewhere classified  PERTINENT HISTORY: C7 SCI    PRECAUTIONS: none  SUBJECTIVE: a  little sore but no bad pain.  PAIN:  Are you having pain? no VAS scale: 0/10     OBJECTIVE: Rt shoulder flexion 142, abd 140   Left shoulder flexion 144, abd 130  TODAY'S TREATMENT:   09/18/20:    Reviewed cable exercises, all 10lb   09/13/2020:   Gym equipment: cables- row, lat pull, triceps, biceps- 10lb   Free weights- deltoid all aspects, OH press 5lb   Trx for use of UE for hip hinge & core activation   09/06/20: Manual therapy: Soft tissue mob to bil upper trap, pectorals Doorway pec stretch in wheelchair: 3 x 30"  Seated scalene stretch with cervical lateral flexion: 3 x 30" Seated cervical rotation with overpressure with towel: 10 x 10" Holds R and L Bil rows: 45lb L glove, 2 x 10 bil Chest press: 35lbs 2 x 10 R and L uni Lat pull downs: 55lbs 2 x 10 bil  08/30/20: Manual therapy: Soft tissue mob to bil upper trap, pectorals Doorway pec stretch in wheelchair: 3 x 30"  With below exercises pts core supported in unsupported position off back rest with min to mod A Bil rows: 45lb L glove, 2 x 10 bil Chest press: 35lbs 2 x 10 R and L uni Lat pull downs: 55lbs 2 x 10 bil I  08/15/20: SciFit: level 5 for UE only for 10' Bil seated rows: 55lbs 3 x 5, used glove for L grip Bil lat pull downs: 55lbs 3  x 5, used glove for L grip Soft tissue massage to bil upper trap and periscapular region, clavicular portions of pecs bil Passive pec stretch bil   08/08/20 Soft tissue massage to bil upper trap and periscapular region Posterior capsule stretch: bil passively Gentle towel stretch for cervical rotations: 10 x 10" holds R and L Pt educated to lift elbows off arm rest often throughout the day to reduce compression through ulnar nerve on medial elbow. Pulleys: flexion and abduction: 10x Seated rows: 45lbs 12x, 30lbs 20x bil  08/01/20 Soft tissue massage to bil upper trap and periscapular region Posterior capsule stretch: bil passively Seated fwd reach: Active, AA and  passive: 10x total  Seated trunk twists: passive: 5x Seated shoulder pulleys into flexion: 20x , abduction: 10x Seated rows:worked to fatigue. 40lbs x 8 bil 30lbs x 11 bil 20lbs x 15 bil Diagonal chops: top to bottom: 10lbs 10x R and L  07/25/20: Soft tissue mobilization to bil upper trap and cervical paraspinalis PROM  of bil shoulders into flexion and horizontal adduction ffor posterior capsule stretch bil Seated AA lumbar flexion with both hands on table in front of him: 15x Bil seated rows: 35 lbs bil:  pt cued to engage paraspinalis for back stabilization and asked to lean forward to initiate pull to engage core.: 35lbs x 15 Bil lat pull downs: 35lbs: 15x, glove required for L hand as pt kept loosing grip on that hand Bil seated chest press: 20lbs 15x, glove required on L hand   07/18/20: Soft tissue mobilization to bil upper trap and cervical paraspinalis PROM  of bil shoulders into flexion and horizontal abduction from pec stretch Sit to stand and bac to sitting: using standing frame: max to total A x 2 Standing in standing frame for 5' Seated biceps curs: 5lbs 20x R and L Seated OH press: 5lbs 2 x 5 R and L  PATIENT EDUCATION: Education details: Information give regarding [facility] upon patient's request and help of interpreter. Person educated: Patient, Spouse and translator Education method: Explanation Education comprehension: verbalized understanding     HOME EXERCISE PROGRAM: Access Code [email or web address], [id number]   ASSESSMENT:   CLINICAL IMPRESSION: pt has made excellent progress since beginning PT. He and his wife are comfortable with use of gym cable machines and free weights. He is d/c to a membership at [employer]. He would like to obtain a referral for aquatics which we discussed as a possibility.    REHAB POTENTIAL: Fair SCI level   CLINICAL DECISION MAKING: Stable/uncomplicated   EVALUATION COMPLEXITY: Low     GOALS: Goals reviewed  with patient? Yes   SHORT TERM GOALS:   STG Name Target Date Goal status  1 Pt will assist his wife with 3 tasks to be an active participant in managing household and to improve I and reduce caregiver burden. Baseline: 07/05/20 Wife helps with him with most tasks. Status: Pt helps with chopping vegetables, making salsa, mixing ingrediants. 08/02/2020   Goal met  08/15/20    LONG TERM GOALS:    LTG Name Target Date Goal status  1 Pt will be able to maintain 5 min of unsupported sitting balance at edge of bed to improve trunk control with dynamic activities. Baseline: 07/05/20 Unable to sit unsupported 08/30/2020   Not met- denies practicing at home  2 Pt will be able to use arm bike for at least 20 min daily to improve UE control and endurance.   Baseline: 07/05/20- pt educated to  purchase the arm bike (sits on table); pt is helping wife with various house chores for 30 min (09/06/20) 08/30/2020   Met  3 Pt will demo at least 160 deg of shoulder flexion and 135 deg of shoulder abduction bil to be able to reach in [facility] cabinets from his power chair. Baseline: 07/05/20: 145 deg flexion and 90 deg abduction bil 08/30/2020   Goal met    PLAN: PT FREQUENCY: 1x/week   PT DURATION: 1 more session   PLANNED INTERVENTIONS: Therapeutic exercises, Therapeutic activity, Neuro Muscular re-education, Patient/Family education, Joint mobilization, Wheelchair mobility training, Cryotherapy, Moist heat and Manual therapy   PLAN FOR NEXT SESSION: discuss aquatics, review gym PRN   PHYSICAL THERAPY DISCHARGE SUMMARY  Visits from Start of Care: 10  Current functional level related to goals / functional outcomes: See above   Remaining deficits: See above   Education / Equipment: Anatomy of condition, POC, HEP, exercise form/rationale   Patient agrees to discharge. Patient goals were met. Patient is being discharged due to meeting the stated rehab goals.   Alyxis Grippi C. Hakeem Frazzini PT, DPT 09/18/20 8:06  PM     [place] [address] [place], [place], [address] Phone: [phone]   Fax:  [phone]  [name] MRN: [id number] DOB: 08/10/1965

## 2020-10-02 ENCOUNTER — Encounter: Payer: Self-pay | Admitting: Physical Medicine and Rehabilitation

## 2020-10-02 ENCOUNTER — Other Ambulatory Visit: Payer: Self-pay

## 2020-10-02 ENCOUNTER — Encounter
Payer: Medicaid Other | Attending: Physical Medicine and Rehabilitation | Admitting: Physical Medicine and Rehabilitation

## 2020-10-02 VITALS — BP 110/73 | HR 60 | Temp 97.9°F | Ht 70.0 in

## 2020-10-02 DIAGNOSIS — R252 Cramp and spasm: Secondary | ICD-10-CM | POA: Insufficient documentation

## 2020-10-02 DIAGNOSIS — M7918 Myalgia, other site: Secondary | ICD-10-CM | POA: Diagnosis present

## 2020-10-02 DIAGNOSIS — G8250 Quadriplegia, unspecified: Secondary | ICD-10-CM | POA: Insufficient documentation

## 2020-10-02 NOTE — Progress Notes (Signed)
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   Here for f/u on SCI as well as trigger point injections.   Just finished his PT- 09/18/20. But they want him to get aquatic therapy- at [facility].   No illnesses since seen last time.    About the same, but loves his trigger point injections, and wants again today.   Last few days, shoulders and back hurting-    More time in between injections than usual,  not sure if that's why hurting more.     Plan:  Will place referral for Aquatic therapy at [facility]- due to quadriplegia- and learning/doing Home exercise program as well as general mobility and endurance training.   2.  If has any issues with wheelchair, call Wheelchair company- and if they need it, they will let me know/or tell you I need to write repair order. [employer] is the company that got him the wheelchair.  His wheelchair was received in 03/2019. We can use another company next time, in 3.5 years when new w/c is due.    3.  Patient here for trigger point injections for  Consent done and on chart.  Cleaned areas with alcohol and injected using a 27 gauge 1.5 inch needle  Injected 4.5cc Using 1% Lidocaine with no EPI  Upper traps B/L  Levators B/L  Posterior scalenes Middle scalenes Splenius Capitus B/L  Pectoralis Major B/L  Rhomboids B/L  Infraspinatus Teres Major/minor B/L  Thoracic paraspinals Lumbar paraspinals Other injections-  B/L deltoids. Big twitch response on R   Patient's level of pain prior was 6/10 Current level of pain after injections is 4/10  There was no bleeding or complications.  Patient was advised to drink a lot of water on day after injections to flush system Will have increased soreness for 12-48 hours after injections.  Can use Lidocaine patches the day AFTER injections Can use theracane  on day of injections in places didn't inject Can use heating pad 4-6 hours AFTER injections  4. F/U in 2-3 months. For trigger point injections and SCI f/u.

## 2020-10-02 NOTE — Patient Instructions (Signed)
Plan:  Will place referral for Aquatic therapy at [facility]- due to quadriplegia- and learning/doing Home exercise program as well as general mobility and endurance training.   2.  If has any issues with wheelchair, call Wheelchair company- and if they need it, they will let me know/or tell you I need to write repair order. [employer] is the company that got him the wheelchair.  His wheelchair was received in 03/2019. We can use another company next time, in 3.5 years when new w/c is due.    3.  Patient here for trigger point injections for  Consent done and on chart.  Cleaned areas with alcohol and injected using a 27 gauge 1.5 inch needle  Injected 4.5cc Using 1% Lidocaine with no EPI  Upper traps B/L  Levators B/L  Posterior scalenes Middle scalenes Splenius Capitus B/L  Pectoralis Major B/L  Rhomboids B/L  Infraspinatus Teres Major/minor B/L  Thoracic paraspinals Lumbar paraspinals Other injections-  B/L deltoids. Big twitch response on R   Patient's level of pain prior was 6/10 Current level of pain after injections is 4/10  There was no bleeding or complications.  Patient was advised to drink a lot of water on day after injections to flush system Will have increased soreness for 12-48 hours after injections.  Can use Lidocaine patches the day AFTER injections Can use theracane on day of injections in places didn't inject Can use heating pad 4-6 hours AFTER injections  4. F/U in 2-3 months. For trigger point injections and SCI f/u.

## 2020-10-06 ENCOUNTER — Ambulatory Visit (HOSPITAL_BASED_OUTPATIENT_CLINIC_OR_DEPARTMENT_OTHER): Payer: Medicaid Other | Admitting: Physical Therapy

## 2020-10-10 ENCOUNTER — Encounter (HOSPITAL_BASED_OUTPATIENT_CLINIC_OR_DEPARTMENT_OTHER): Payer: Self-pay | Admitting: Physical Therapy

## 2020-10-10 ENCOUNTER — Other Ambulatory Visit: Payer: Self-pay

## 2020-10-10 ENCOUNTER — Ambulatory Visit (HOSPITAL_BASED_OUTPATIENT_CLINIC_OR_DEPARTMENT_OTHER): Payer: Medicaid Other | Admitting: Physical Therapy

## 2020-10-10 DIAGNOSIS — G8253 Quadriplegia, C5-C7 complete: Secondary | ICD-10-CM

## 2020-10-10 DIAGNOSIS — G8250 Quadriplegia, unspecified: Secondary | ICD-10-CM | POA: Diagnosis not present

## 2020-10-10 DIAGNOSIS — R293 Abnormal posture: Secondary | ICD-10-CM

## 2020-10-10 DIAGNOSIS — M6281 Muscle weakness (generalized): Secondary | ICD-10-CM

## 2020-10-11 NOTE — Therapy (Addendum)
[place], [place], [address] Phone: [phone]   Fax:  [phone]  Physical Therapy Evaluation/Discharge   Patient Details  Name: [name] MRN: [id number] Date of Birth: 1965-09-10 Referring Provider (PT): Dr Maxwell Marion   Encounter Date: 10/10/2020   PT End of Session - 10/10/20 1351     Visit Number 1    Number of Visits 4    Date for PT Re-Evaluation 11/07/20    Authorization Type [employer]    PT Start Time 1015    PT Stop Time 1057    PT Time Calculation (min) 42 min    Activity Tolerance Patient tolerated treatment well    Behavior During Therapy [facility] for tasks assessed/performed             Past Medical History:  Diagnosis Date   Diabetes mellitus without complication ([place])    History of cervical fracture     Past Surgical History:  Procedure Laterality Date   CERVICAL SPINE SURGERY      There were no vitals filed for this visit.    Subjective Assessment - 10/10/20 1346     Subjective Patient has been seen at our neuro clinic for 10 visits. he was discharged 2nd to reaching max benefit. he returns today with a script for his legs with the hope of doing aquatic therapy. At home he has been a total lift treansfer. he has transfred with a slide board int he past.    Patient is accompained by: Interpreter    Pertinent History DMII, Cervical spine surgery    Limitations Standing;Walking    How long can you stand comfortably? unable    How long can you walk comfortably? unable    Currently in Pain? No/denies                [facility] PT Assessment - 10/11/20 0001       Assessment   Medical Diagnosis Quadraplegia    Referring Provider (PT) Dr Maxwell Marion    Onset Date/Surgical Date --   07/2018   Hand Dominance Right    Prior Therapy Had several visits of therapy at neuro      Precautions   Precautions Fall    Precaution Comments Max a required to transfer       Restrictions   Weight Bearing Restrictions No      Balance Screen   Has the patient fallen in the past 6 months No    Has the patient had a decrease in activity level because of a fear of falling?  No    Is the patient reluctant to leave their home because of a fear of falling?  No      Home Social worker Private residence    Living Arrangements Spouse/significant other;Children    Available Help at Discharge Family    Type of [place] One level    Home Equipment Wheelchair - power;Hospital bed    Additional Comments Wife assists and also has aide 2x/week to assist with bathing, meals for 4 hours/day.       Prior Function   Level of Independence Needs assistance with ADLs;Needs assistance with transfers    Vocation On disability    Leisure hopes to go to the gym      Cognition   Overall Cognitive Status Within Functional Limits for tasks assessed  Attention Focused    Focused Attention Appears intact    Memory Appears intact    Awareness Appears intact    Problem Solving Appears intact      Sensation   Additional Comments No feeling in lower legs      Coordination   Gross Motor Movements are Fluid and Coordinated No    Fine Motor Movements are Fluid and Coordinated No      ROM / Strength   AROM / PROM / Strength AROM;PROM;Strength      AROM   Overall AROM Comments no active movements of his legs      Strength   Overall Strength Comments 0/5 in all muscle groups of the lwoer legs      Transfers   Comments MAx A+2 with slide borad to and from the table                        Objective measurements completed on examination: See above findings.               PT Education - 10/10/20 1348     Education Details need to be able to transfer to a small chair to get into the pool. Explained skilled therapy.    Person(s) Educated Patient;Spouse    Methods  Explanation;Demonstration;Tactile cues;Verbal cues    Comprehension Verbalized understanding;Returned demonstration;Verbal cues required;Tactile cues required                 PT Long Term Goals - 10/11/20 1206       PT LONG TERM GOAL #1   Title Patient will be indepdnent with gym program    Time 4    Period Weeks    Status New    Target Date 11/08/20      PT LONG TERM GOAL #2   Title Patient will perfrom slide board transfer with mod a    Time 4    Period Weeks    Status New    Target Date 11/08/20                    Plan - 10/10/20 1352     Clinical Impression Statement Patient presents to therapy hoping to perform therapy in the pool. At this time the patient has no active movement of his legs. His perscription is for his legs. He required max a+2 to transfer to a table with a slide board. Once to the table he required min a to remain sitting.  He will not be able to safely transfer to our transfer chair to get into the pool. He inquired about working on his legs with therapy, but with no active muscle movement, He plans to go to the gym for his upper body. Therapy agreed to review gym exercises for a visit or two , but at this time he is not approiate for our pool 2nd to inability to get in safely. He also has questionable potential for leg strengthening 2nd to no ability to active his leg muscles.    Personal Factors and Comorbidities Comorbidity 1;Comorbidity 2;Comorbidity 3+    Comorbidities DMII, quadraplegia.    Examination-Activity Limitations Sit;Locomotion Level;Transfers;Bed Mobility;Carry;Dressing;Stand;Stairs;Lift;Toileting    Examination-Participation Restrictions Cleaning    Stability/Clinical Decision Making Stable/Uncomplicated    Clinical Decision Making Moderate    Rehab Potential Fair   fair ability to go back to the gym; poor ability to get into the pool.   PT Frequency 1x / week  PT Duration 4 weeks    PT Treatment/Interventions ADLs/Self  Care Home Management;Functional mobility training;Therapeutic activities;Therapeutic exercise;Balance training;Neuromuscular re-education;Patient/family education;Passive range of motion    PT Next Visit Plan review gym exercises that he can do and slide board transfers    Consulted and Agree with Plan of Care Patient             Patient will benefit from skilled therapeutic intervention in order to improve the following deficits and impairments:  Abnormal gait, Difficulty walking, Decreased range of motion, Decreased safety awareness, Pain, Decreased mobility, Decreased strength, Decreased balance, Decreased activity tolerance  Visit Diagnosis: Muscle weakness (generalized)  Quadriplegia, C5-C7 complete ([facility])  Abnormal posture   PHYSICAL THERAPY DISCHARGE SUMMARY  Visits from Start of Care: 1  Current functional level related to goals / functional outcomes: Was not appropriate for prescribed treatment    Remaining deficits: Unable to use LE    Education / Equipment: HEP   Patient agrees to discharge. Patient goals were not met. Patient is being discharged due to  refferal N/A.   Problem List Patient Active Problem List   Diagnosis Date Noted   Acute metabolic encephalopathy 17/01/7870   Pneumonia 04/26/2020   DM2 (diabetes mellitus, type 2) ([place]) 04/26/2020   History of DVT (deep vein thrombosis) 04/26/2020   Acute lower UTI 04/26/2020   Community acquired pneumonia of left lower lobe of lung 04/26/2020   At risk for unstable body temperature 11/26/2019   Wheelchair dependence 11/26/2019   Pressure ulcer 09/22/2019   Cellulitis 09/22/2019   Hypokalemia 09/22/2019   Spasticity 03/08/2019   Erectile dysfunction due to diseases classified elsewhere 03/08/2019   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/22/2019   Neurogenic bowel 01/22/2019   Neurogenic bladder 01/22/2019   Myofascial pain 01/22/2019   Spinal cord injury at T1-T6 level ([place]) 12/30/2018   Quadriplegia ([place])  12/30/2018    Carney Living PT DPT  10/11/2020, 12:10 PM  Addendum 05/22/2021   [place] [address] [place], [place], [address] Phone: [phone]   Fax:  [phone]  Name: Omarius Grantham MRN: [id number] Date of Birth: 08-03-1965

## 2020-11-14 IMAGING — DX DG FOOT COMPLETE 3+V*L*
3 series · 3 of 3 positions shown · non-contrast
Comparison: None.

CLINICAL DATA: Foot ulcers

EXAM:
LEFT FOOT - COMPLETE 3+ VIEW

[x foot ap left]
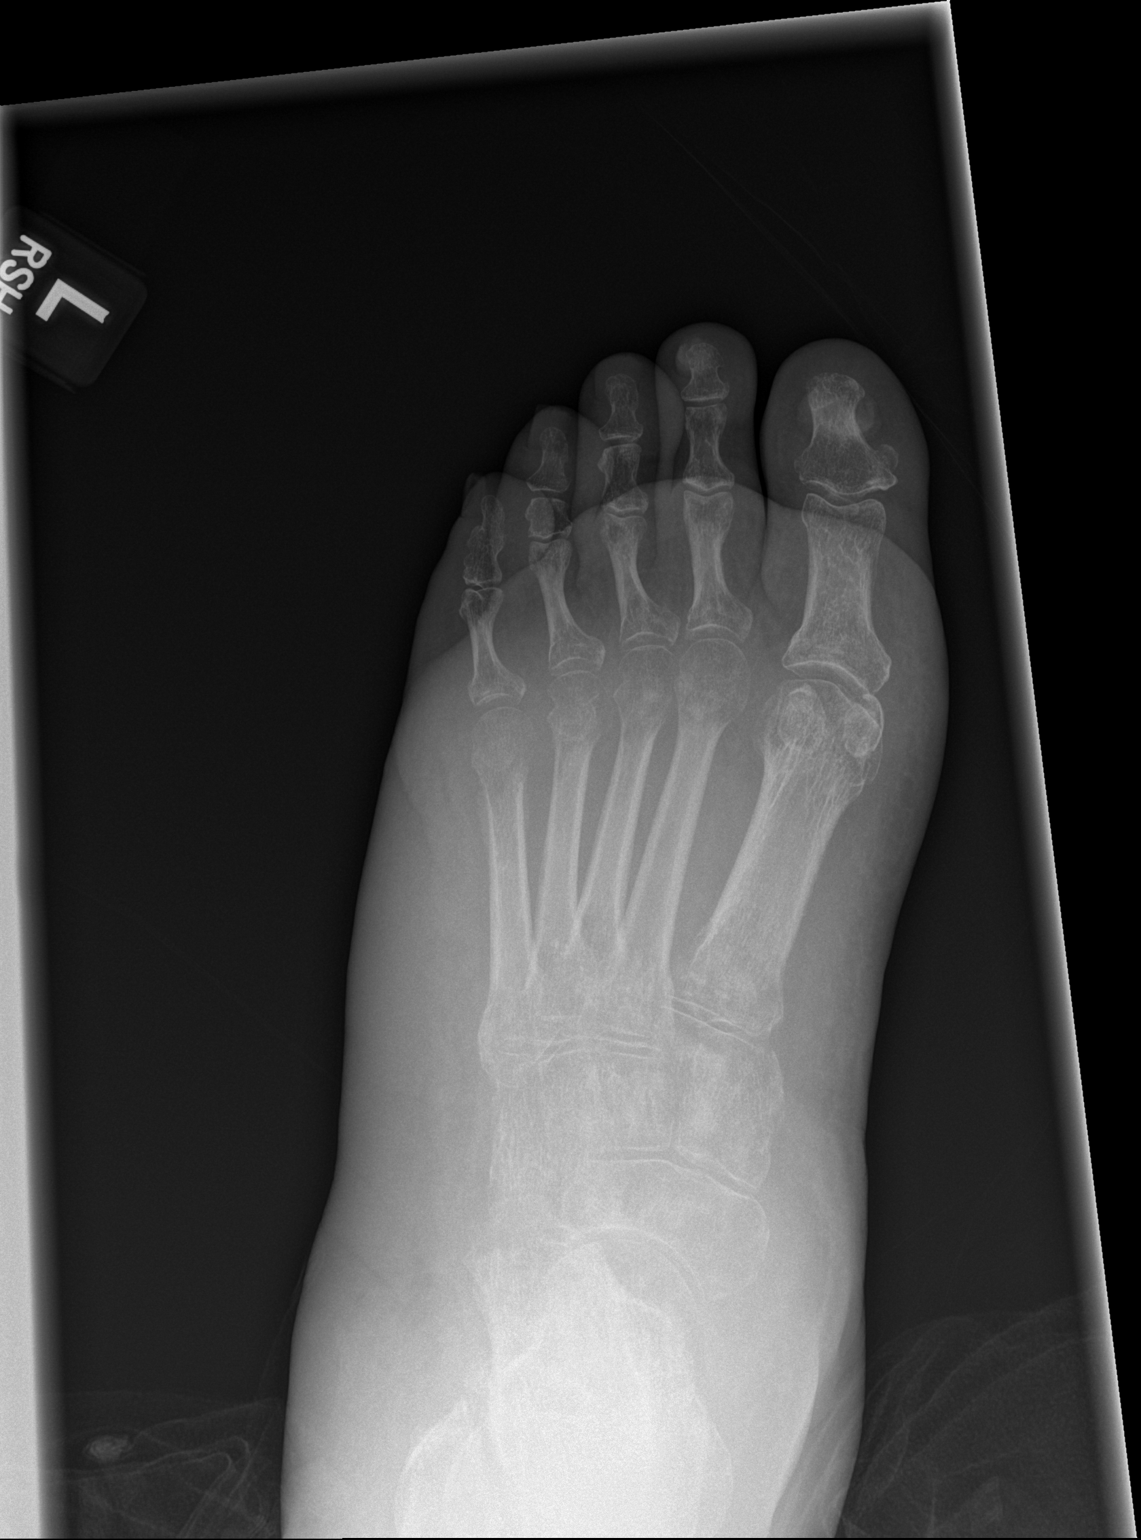

[x foot obl left]
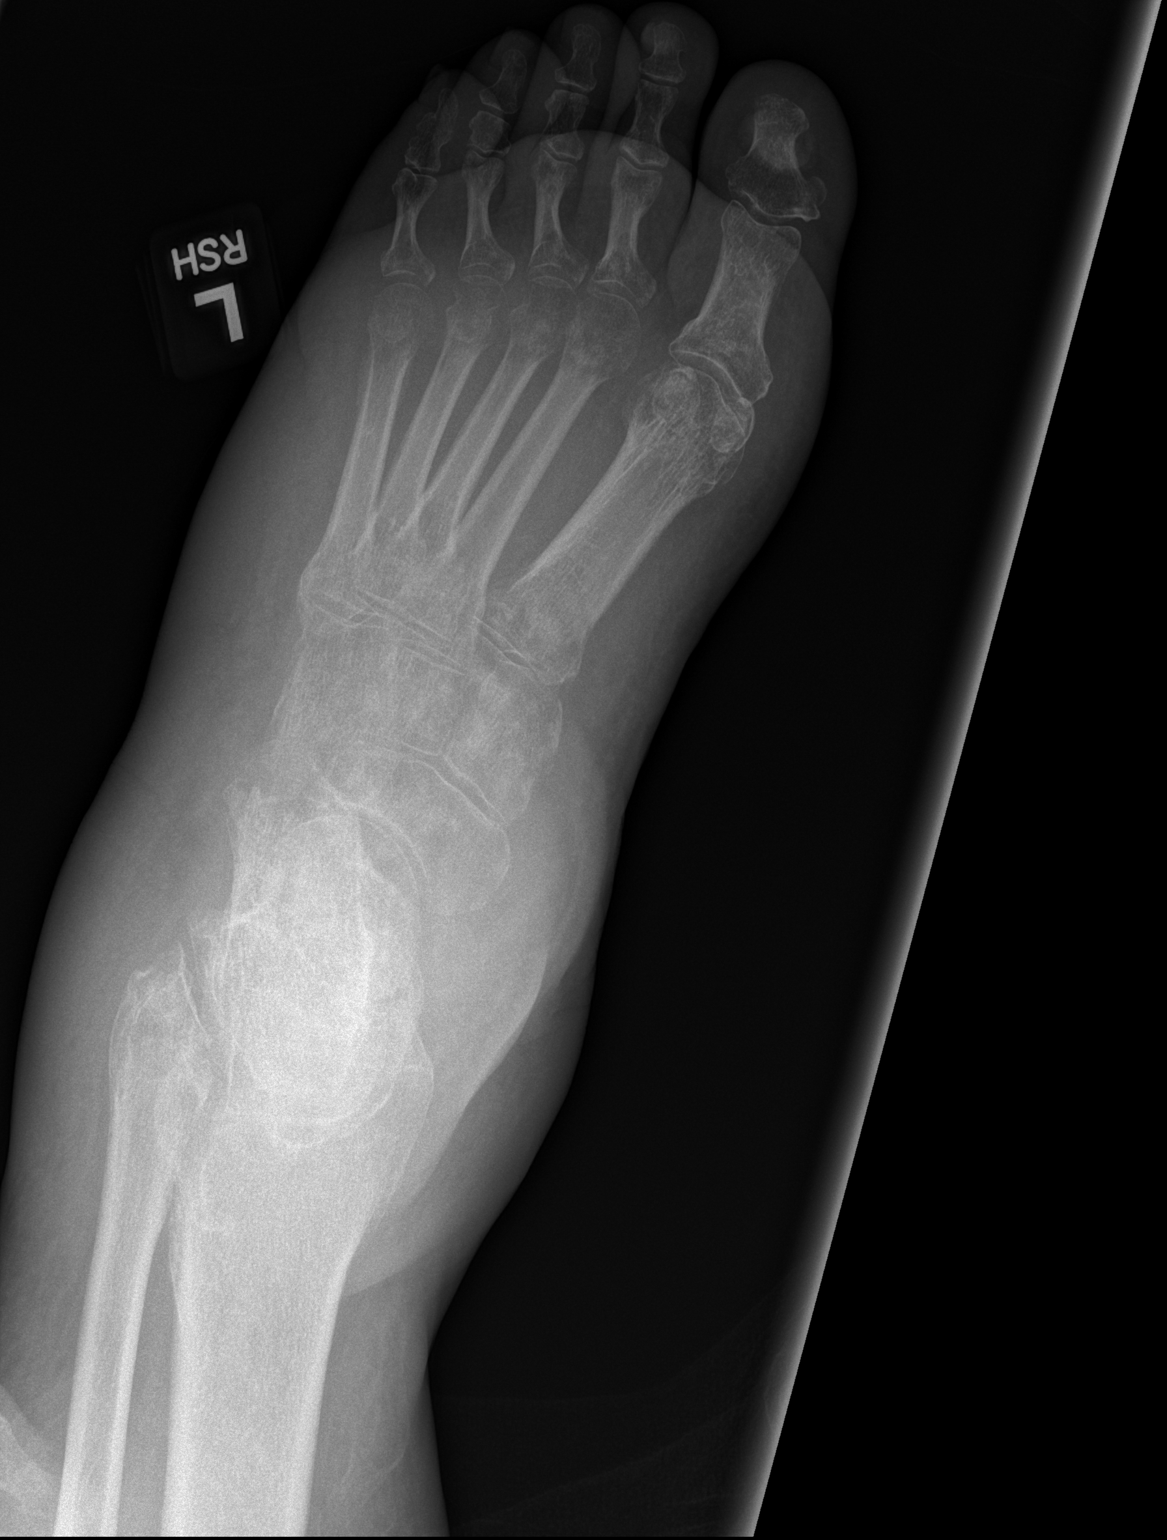

[x foot lat left]
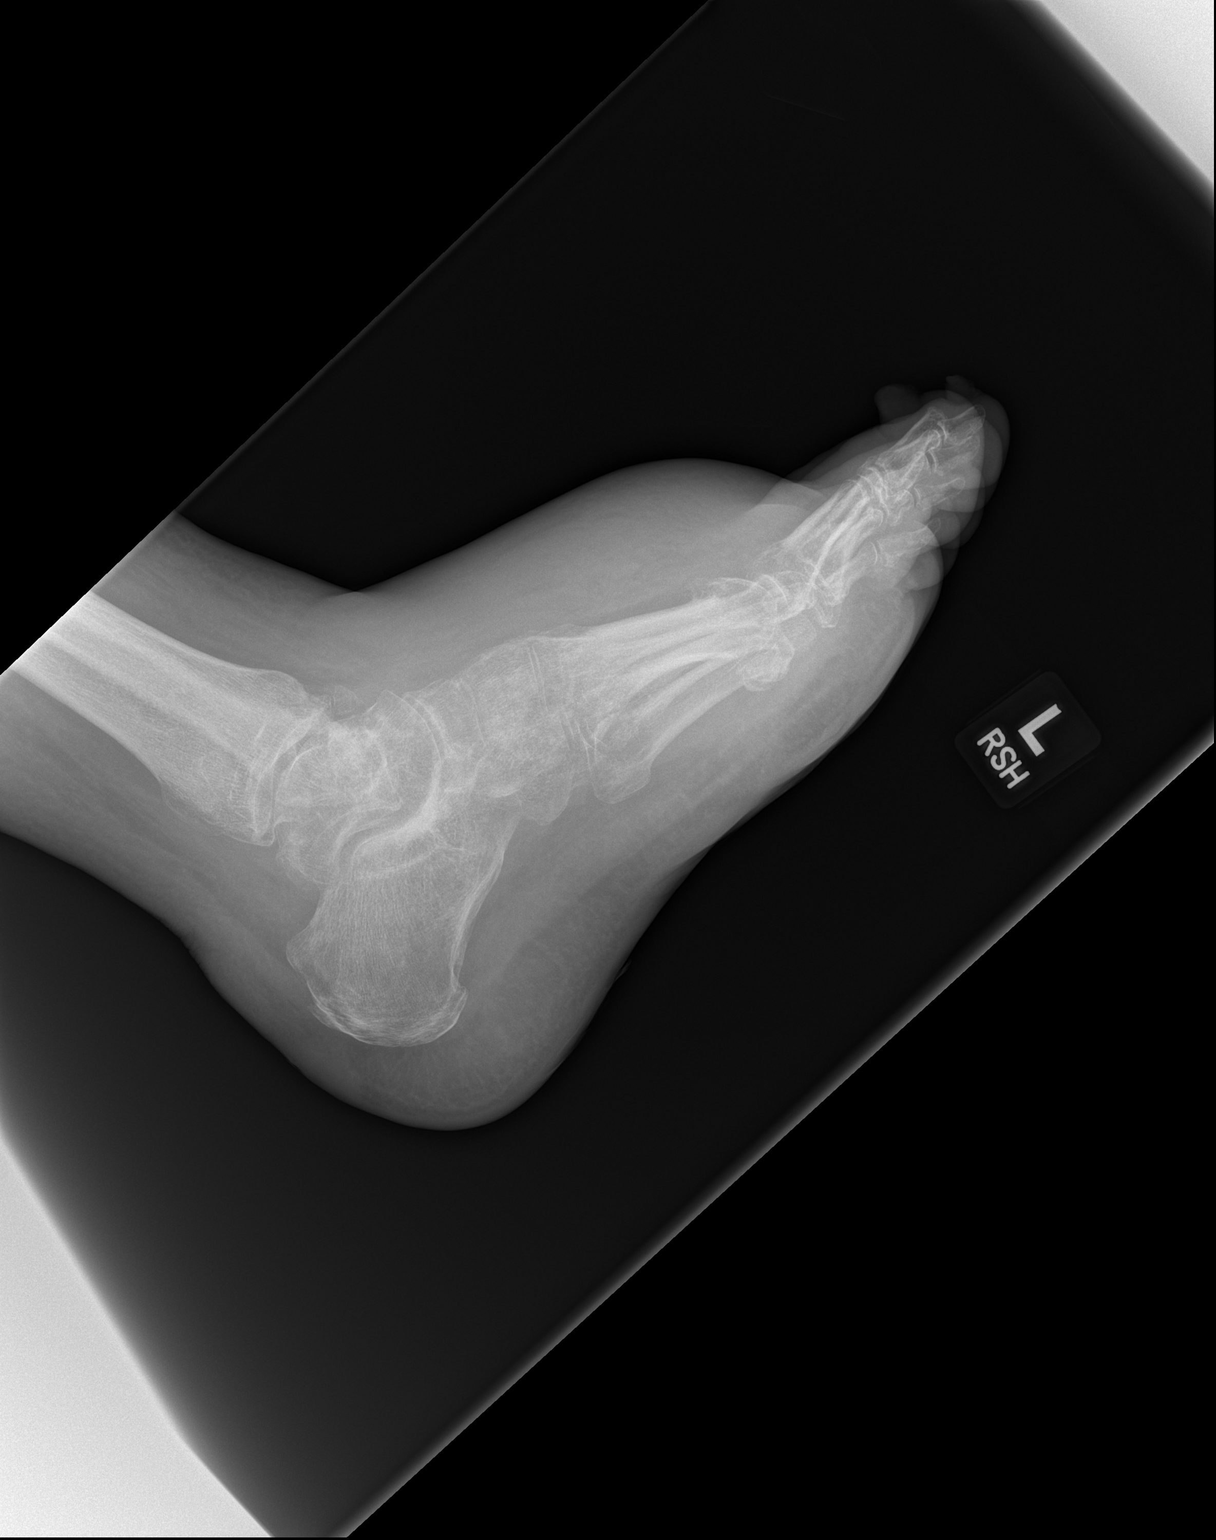

[3 of 3 positions shown; findings below may reference images not displayed]

FINDINGS: Osteopenia. No fracture or dislocation of the left foot. Joint
spaces are preserved. Diffuse soft tissue edema about the foot and
ankle.
IMPRESSION: 1.  Osteopenia. No fracture or dislocation of the left foot.

2.  No radiographic evidence of osteomyelitis.

3. Diffuse soft tissue edema about the foot and ankle.

## 2020-11-14 IMAGING — DX DG TIBIA/FIBULA 2V*L*
4 series · 4 of 4 positions shown · non-contrast
Comparison: None.

CLINICAL DATA: Ulcerations.  Wheelchair bound.

EXAM:
LEFT TIBIA AND FIBULA - 2 VIEW

[x tib-fib ap left (1 of 2)]
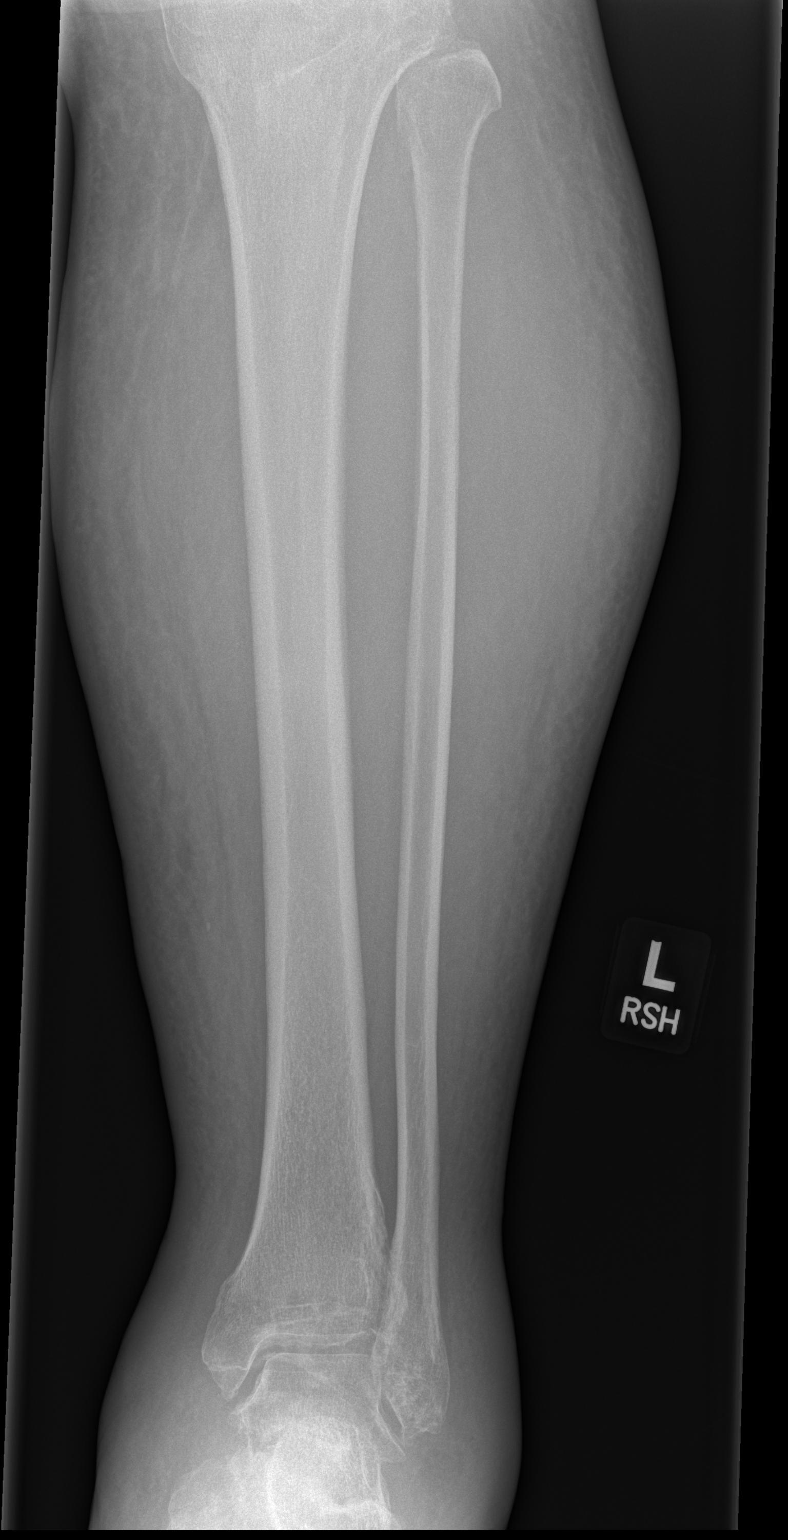

[x tib-fib ap left (2 of 2)]
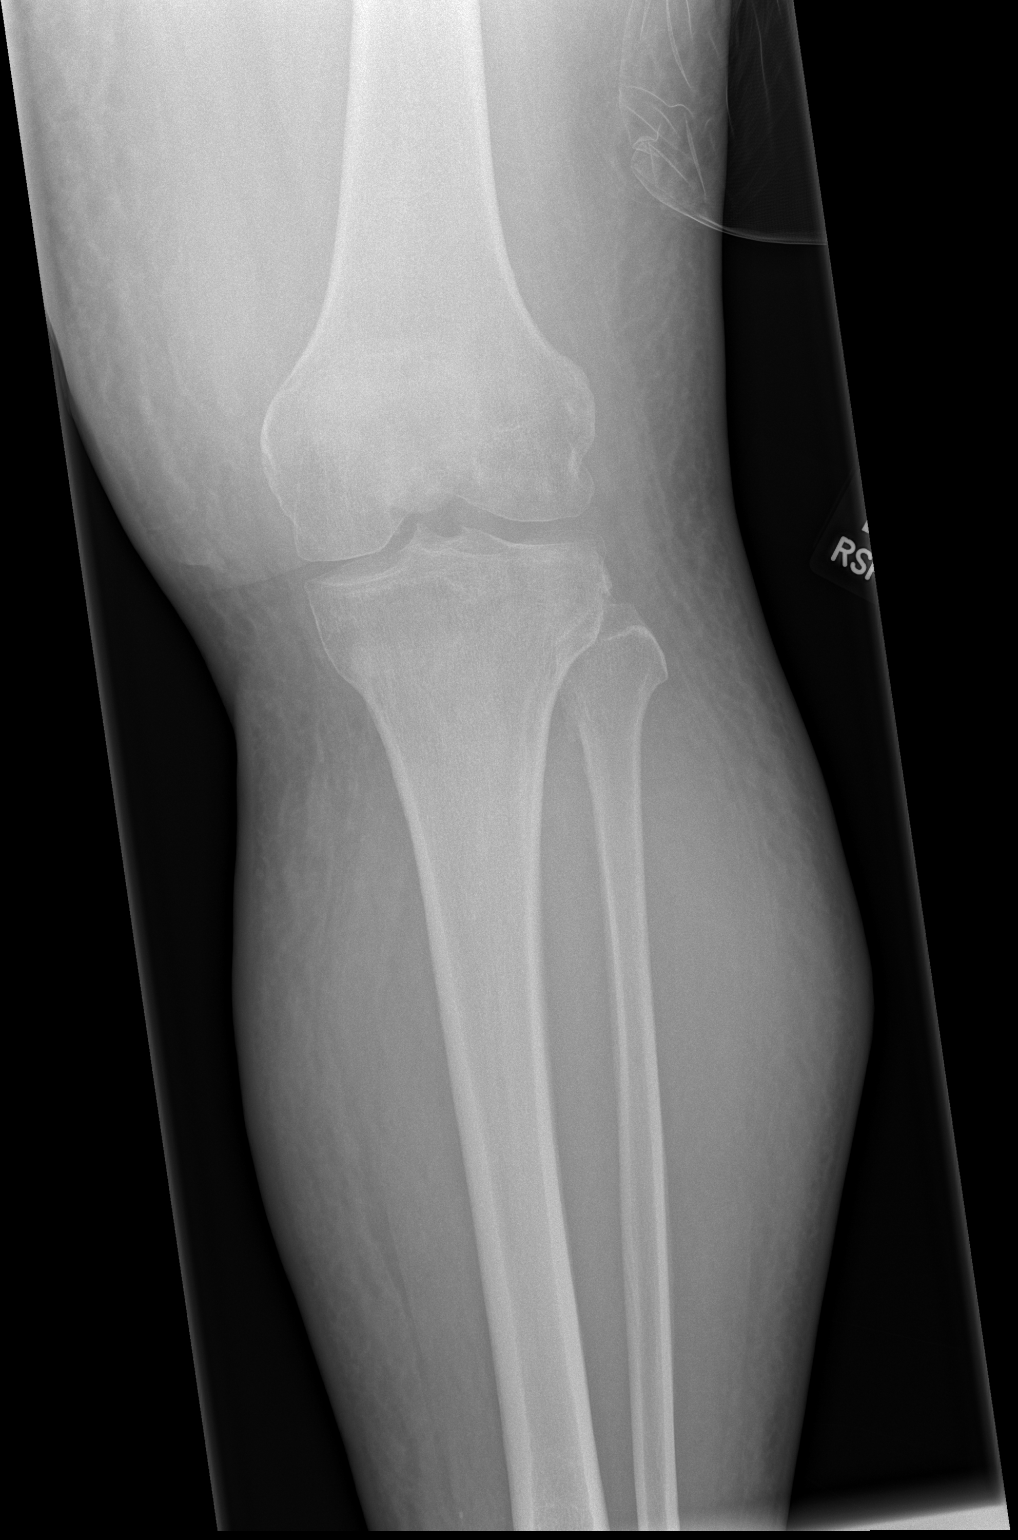

[x tib-fib lat left (1 of 2)]
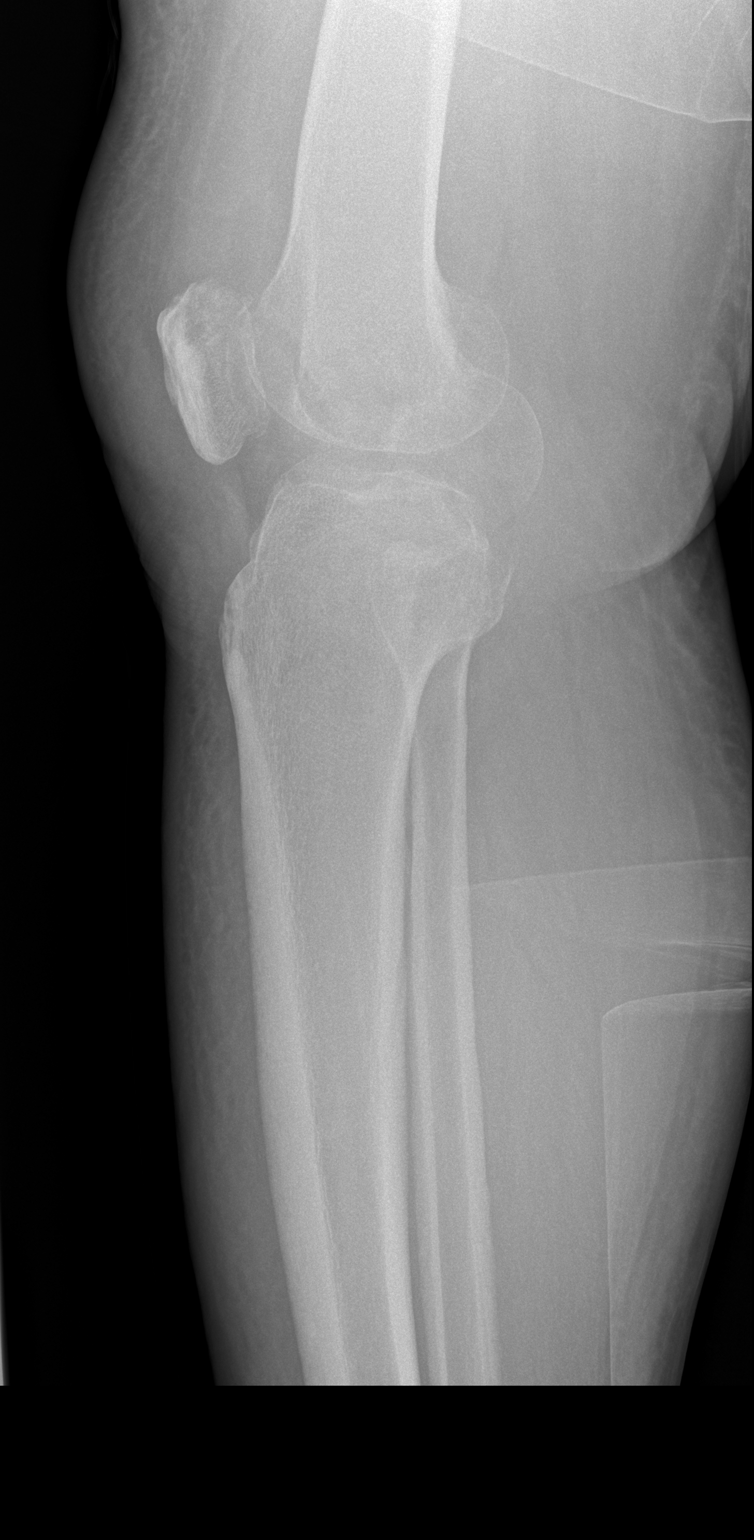

[x tib-fib lat left (2 of 2)]
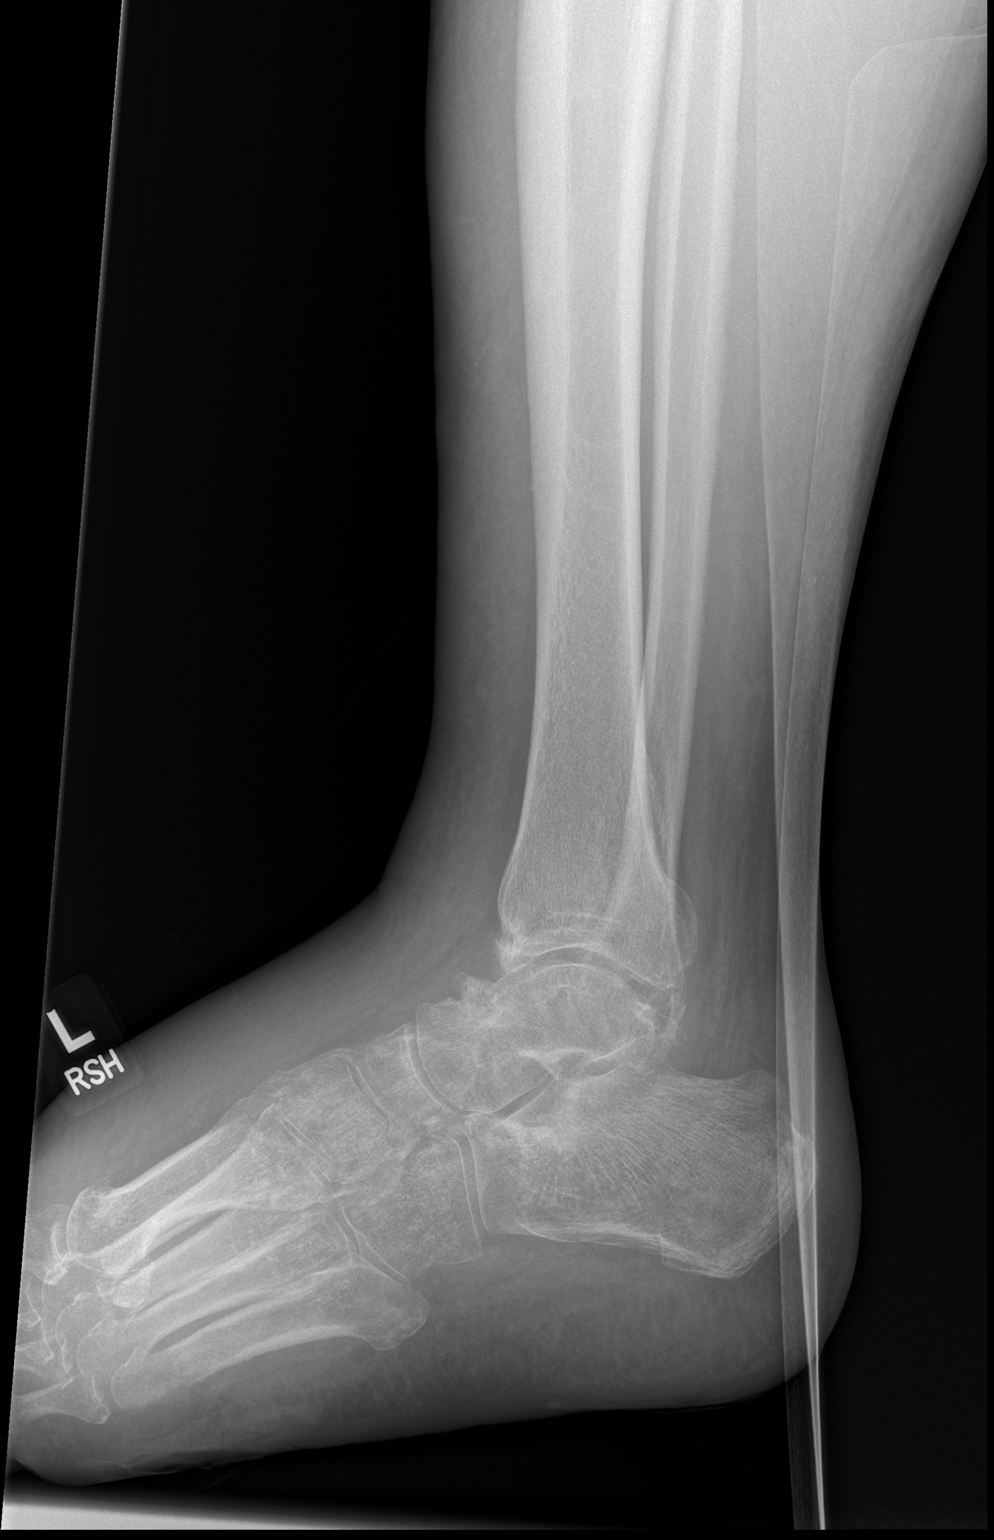

[4 of 4 positions shown; findings below may reference images not displayed]

FINDINGS: No acute osseous findings in the tibia fibula. No erosion. Disuse
osteopenia within the calcaneus and midfoot.
IMPRESSION: 1. No acute findings in the tibia fibula.
2. Disuse osteopenia within the calcaneus and midfoot

## 2021-01-01 ENCOUNTER — Encounter: Payer: Self-pay | Admitting: Physical Medicine and Rehabilitation

## 2021-01-01 ENCOUNTER — Encounter
Payer: Medicaid Other | Attending: Physical Medicine and Rehabilitation | Admitting: Physical Medicine and Rehabilitation

## 2021-01-01 ENCOUNTER — Other Ambulatory Visit: Payer: Self-pay

## 2021-01-01 VITALS — BP 100/62 | HR 63

## 2021-01-01 DIAGNOSIS — N521 Erectile dysfunction due to diseases classified elsewhere: Secondary | ICD-10-CM | POA: Insufficient documentation

## 2021-01-01 DIAGNOSIS — M7918 Myalgia, other site: Secondary | ICD-10-CM | POA: Diagnosis not present

## 2021-01-01 DIAGNOSIS — G8921 Chronic pain due to trauma: Secondary | ICD-10-CM | POA: Diagnosis present

## 2021-01-01 DIAGNOSIS — Z993 Dependence on wheelchair: Secondary | ICD-10-CM | POA: Diagnosis not present

## 2021-01-01 DIAGNOSIS — G8250 Quadriplegia, unspecified: Secondary | ICD-10-CM | POA: Diagnosis not present

## 2021-01-01 MED ORDER — SILDENAFIL CITRATE 100 MG PO TABS: 100.0000 mg | ORAL_TABLET | Freq: Every day | ORAL | 11 refills | Status: DC | PRN

## 2021-01-01 MED ORDER — HYDROCODONE-ACETAMINOPHEN 5-325 MG PO TABS: 1.0000 | ORAL_TABLET | Freq: Four times a day (QID) | ORAL | 0 refills | Status: DC | PRN

## 2021-01-01 MED ORDER — APIXABAN 5 MG PO TABS
5.0000 mg | ORAL_TABLET | Freq: Two times a day (BID) | ORAL | 3 refills | Status: DC
Start: 2021-01-01 — End: 2021-01-23

## 2021-01-01 MED ORDER — GABAPENTIN 600 MG PO TABS
600.0000 mg | ORAL_TABLET | Freq: Three times a day (TID) | ORAL | 3 refills | Status: DC
Start: 2021-01-01 — End: 2021-01-23

## 2021-01-01 MED ORDER — BACLOFEN 10 MG PO TABS
ORAL_TABLET | ORAL | 3 refills | Status: DC
Start: 2021-01-01 — End: 2021-04-09

## 2021-01-01 NOTE — Patient Instructions (Signed)
Plan: Patient here for trigger point injections for  Consent done and on chart.  Cleaned areas with alcohol and injected using a 27 gauge 1.5 inch needle  Injected 6cc Using 1% Lidocaine with no EPI  Upper traps B/L  Levators B/L  Posterior scalenes- B/L  Middle scalenes Splenius Capitus- B/L  Pectoralis Major- B/L  Rhomboids- B/L  Infraspinatus Teres Major/minor Thoracic paraspinals Lumbar paraspinals Other injections-   Patient's level of pain prior was- 6-7/10 Current level of pain after injections is- 4-5/10  There was no bleeding or complications.  Patient was advised to drink a lot of water on day after injections to flush system Will have increased soreness for 12-48 hours after injections.  Can use Lidocaine patches the day AFTER injections Can use theracane on day of injections in places didn't inject Can use heating pad 4-6 hours AFTER injections   2. Gave Rx for Norco 5/325 mg q6 hours as needed #60 - rarely asks for it- but needs for trip to [place]. Hasn't gotten a refill since 07/16/19.    3. Will change Baclofen to 10 mg TID- 3 months supply- sent in    4. Will change Gabapentin which is 600 mg TID to 3 month supply- sent in.   5. Needs Eliquis Rx- to be filled early- sent in 3 months supply- but needs to be filled 01/24/21.   6. F/U 2-3 months for Trigger point injections and f/u on SCI

## 2021-01-01 NOTE — Progress Notes (Signed)
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  Here for f/u on SCI and trigger point injections.    Bowels- going well- does every night.  Bladder- in/out cathed q4 hours-   Wants injections for shoulder pain-  Having a little more pain due to cold weather lately.   Hasn't been sick since last seen.   Was going to t go to aquatic therapy, but was really hard to get him in the pool after 1st appointment.   Goes to the gym- tries to go 1-2x/week.  PRAFO on R broke-   Exam: Awake, alert, accompanied by wife in power w/c with joystick on L; NAD Wearing Prevalon boot on R and PRAFO on L; strap around legs- just below knees to keep legs in adduction.   Trigger points in neck, upper back -chronic.     Plan: Patient here for trigger point injections for  Consent done and on chart.  Cleaned areas with alcohol and injected using a 27 gauge 1.5 inch needle  Injected 6cc Using 1% Lidocaine with no EPI  Upper traps B/L  Levators B/L  Posterior scalenes- B/L  Middle scalenes Splenius Capitus- B/L  Pectoralis Major- B/L  Rhomboids- B/L  Infraspinatus Teres Major/minor Thoracic paraspinals Lumbar paraspinals Other injections- B/L deltoids  Patient's level of pain prior was- 6-7/10 Current level of pain after injections is- 4-5/10  There was no bleeding or complications.  Patient was advised to drink a lot of water on day after injections to flush system Will have increased soreness for 12-48 hours after injections.  Can use Lidocaine patches the day AFTER injections Can use theracane on day of injections in places didn't inject Can use heating pad 4-6 hours AFTER injections   2. Gave Rx for Norco 5/325 mg q6 hours as needed #60 - rarely asks for it- but needs for trip to [place]. Hasn't gotten a refill since 07/16/19.    3. Will  change Baclofen to 10 mg TID- 3 months supply- sent in    4. Will change Gabapentin which is 600 mg TID to 3 month supply- sent in.   5. Needs Eliquis Rx- to be filled early- sent in 3 months supply- but needs to be filled 01/24/21.   6. F/U 2-3 months for Trigger point injections and f/u on SCI  7. I also wrote [facility] for L Prevalon and R PRAFO since his are not fixable- Gave to pt   8. Needs to give Rx I write for Foley catheters- with bag #4/month- since she doesn't remember who sends him the in/out catheters-

## 2021-01-02 ENCOUNTER — Telehealth: Payer: Self-pay

## 2021-01-02 NOTE — Telephone Encounter (Signed)
PA submitted for Hydrocodone/APAP ?

## 2021-01-10 ENCOUNTER — Telehealth: Payer: Self-pay | Admitting: *Deleted

## 2021-01-10 NOTE — Telephone Encounter (Signed)
Prior auth resubmitted to [facility] plan via CoverMyMeds. They were unable to locate patient and date of birth because on his insurance card it is listed as [name]. It has been resubmitted under this name and accepted.

## 2021-01-10 NOTE — Telephone Encounter (Signed)
Request Reference Number: [id number]. HYDROCO/APAP TAB 5-325MG  is approved through 07/10/2021. For further questions, call [employer] at [phone].

## 2021-01-22 ENCOUNTER — Other Ambulatory Visit: Payer: Self-pay | Admitting: Physical Medicine and Rehabilitation

## 2021-02-23 ENCOUNTER — Other Ambulatory Visit: Payer: Self-pay | Admitting: Physical Medicine and Rehabilitation

## 2021-04-09 ENCOUNTER — Encounter: Payer: Self-pay | Admitting: Physical Medicine and Rehabilitation

## 2021-04-09 ENCOUNTER — Encounter
Payer: Medicare Other | Attending: Physical Medicine and Rehabilitation | Admitting: Physical Medicine and Rehabilitation

## 2021-04-09 ENCOUNTER — Other Ambulatory Visit: Payer: Self-pay

## 2021-04-09 ENCOUNTER — Other Ambulatory Visit: Payer: Self-pay | Admitting: Physical Medicine and Rehabilitation

## 2021-04-09 VITALS — BP 95/61 | HR 69

## 2021-04-09 DIAGNOSIS — G8250 Quadriplegia, unspecified: Secondary | ICD-10-CM | POA: Diagnosis present

## 2021-04-09 DIAGNOSIS — M7918 Myalgia, other site: Secondary | ICD-10-CM | POA: Diagnosis present

## 2021-04-09 DIAGNOSIS — R252 Cramp and spasm: Secondary | ICD-10-CM | POA: Insufficient documentation

## 2021-04-09 DIAGNOSIS — Z993 Dependence on wheelchair: Secondary | ICD-10-CM | POA: Insufficient documentation

## 2021-04-09 DIAGNOSIS — G8921 Chronic pain due to trauma: Secondary | ICD-10-CM | POA: Diagnosis present

## 2021-04-09 MED ORDER — APIXABAN 5 MG PO TABS: 5.0000 mg | ORAL_TABLET | Freq: Two times a day (BID) | ORAL | 3 refills | Status: DC

## 2021-04-09 MED ORDER — FLUOXETINE HCL 20 MG PO CAPS: ORAL_CAPSULE | ORAL | 3 refills | Status: DC

## 2021-04-09 NOTE — Progress Notes (Signed)
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  Here for f/u on SCI and trigger point injections.  Hasn't used pain meds very often.  Has some pain in neck, back and shoulders.  Only takes pain meds when pain gets real bad- 7/10 or more.   Occurs ~ 1x/week- usually takes tylenol when not so bad. Usually works.   Occurs frequently when travels.  Sometimes goes to [place] very bumpy and causes pain.  Has foley- right now since went to visit friends. Only when visits family/friends.  Usually caths every 4-6 hours-  Bowels - does bowel program daily with suppository- and does dig stim.   Didn't get new boots for him- the PRAFO/Prevalon.   Trouble getting catheters- waiting for doctor to call back-  Seeing Urology for this.   Plan:  Write another Rx for B/L PRAFO's since they won't do Prevalon boots at [facility]- also called [facility] to verify can do this Rx and Rx given to wife.   2.  Con't Baclofen- 10 mg TID- doesn't need refills.   3. Con't Gabapentin for nerve pain 600 mg TID Doesn't needs refills  4. Write for refills AGAIN on Eliquis and Prozac, even though write for 1 year at last appt 3 months ago.   5.For muscle tightness- Patient here for trigger point injections for  Consent done and on chart.  Cleaned areas with alcohol and injected using a 27 gauge 1.5 inch needle  Injected 4.5cc Using 1% Lidocaine with no EPI  Upper traps- B/L  Levators- B/L  Posterior scalenes Middle scalenes- B/L  Splenius Capitus- B/L  Pectoralis Major Rhomboids- B/L  Infraspinatus Teres Major/minor- L only Thoracic paraspinals Lumbar paraspinals Other injections- L deltoid   Patient's level of pain prior was 5-6/10 Current level of pain after injections is- 4/10 with improved ROM  There was no bleeding or  complications.  Patient was advised to drink a lot of water on day after injections to flush system Will have increased soreness for 12-48 hours after injections.  Can use Lidocaine patches the day AFTER injections Can use theracane on day of injections in places didn't inject Can use heating pad 4-6 hours AFTER injections   6. Con't bowel program and cathing- keeping him from getting UTIs  7. F/U in 6months- double appt SCI and trP injections  .I spent a total of 34    minutes on total care today- >50% coordination of care- due to 10 minutes for injections and 24 minutes on f/u discussing plan for SCI

## 2021-04-09 NOTE — Patient Instructions (Signed)
Plan:  Write another Rx for B/L PRAFO's since they won't do Prevalon boots at [facility]- also called [facility] to verify can do this Rx and Rx given to wife.   2.  Con't Baclofen- 10 mg TID- doesn't need refills.   3. Con't Gabapentin for nerve pain 600 mg TID Doesn't needs refills  4. Write for refills AGAIN on Eliquis and Prozac, even though write for 1 year at last appt 3 months ago.   5.For muscle tightness- Patient here for trigger point injections for  Consent done and on chart.  Cleaned areas with alcohol and injected using a 27 gauge 1.5 inch needle  Injected 4.5cc Using 1% Lidocaine with no EPI  Upper traps- B/L  Levators- B/L  Posterior scalenes Middle scalenes- B/L  Splenius Capitus- B/L  Pectoralis Major Rhomboids- B/L  Infraspinatus Teres Major/minor- L only Thoracic paraspinals Lumbar paraspinals Other injections- L deltoid   Patient's level of pain prior was 5-6/10 Current level of pain after injections is- 4/10 with improved ROM  There was no bleeding or complications.  Patient was advised to drink a lot of water on day after injections to flush system Will have increased soreness for 12-48 hours after injections.  Can use Lidocaine patches the day AFTER injections Can use theracane on day of injections in places didn't inject Can use heating pad 4-6 hours AFTER injections   6. Con't bowel program and cathing- keeping him from getting UTIs  7. F/U in 99months- double appt SCI and trP injections

## 2021-07-20 ENCOUNTER — Encounter: Payer: Self-pay | Admitting: Physical Medicine and Rehabilitation

## 2021-07-20 ENCOUNTER — Encounter
Payer: Medicare Other | Attending: Physical Medicine and Rehabilitation | Admitting: Physical Medicine and Rehabilitation

## 2021-07-20 VITALS — BP 120/75 | HR 70 | Ht 70.0 in

## 2021-07-20 DIAGNOSIS — M7918 Myalgia, other site: Secondary | ICD-10-CM | POA: Diagnosis present

## 2021-07-20 DIAGNOSIS — S24101A Unspecified injury at T1 level of thoracic spinal cord, initial encounter: Secondary | ICD-10-CM | POA: Diagnosis present

## 2021-07-20 DIAGNOSIS — G8921 Chronic pain due to trauma: Secondary | ICD-10-CM | POA: Diagnosis present

## 2021-07-20 NOTE — Patient Instructions (Signed)
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  Here for f/u on SCI and trigger point injections.  Suggest massage gun for back and neck pain.   2.   Gets foley and catheters- from [facility]- [phone]- needs Rx sent to them- gave Rx last visit, but gave to drug store- needs foley still- changes bag 4x/month.  Uses for occasions like graduation, etc- otherwise does in/outh caths- 6 catheters/day- #180- they are sending in/out caths.   3. Doesn't need refillls On Eliquis  Baclofen, Gabapentin, will continue at this time.   4. Patient here for trigger point injections for  Consent done and on chart.  Cleaned areas with alcohol and injected using a 27 gauge 1.5 inch needle  Injected 5cc Using 1% Lidocaine with no EPI  Upper traps B/L  Levators- B/L  Posterior scalenes Middle scalenes- B/L  Splenius Capitus- B/L  Pectoralis Major Rhomboids- B/L x2 Infraspinatus Teres Major/minor- B/L  Thoracic paraspinals Lumbar paraspinals Other injections- B/L deltoids and forearms- extensor compartment B/L    Patient's level of pain prior was 7/10 Current level of pain after injections is 5/10  There was no bleeding or complications.  Patient was advised to drink a lot of water on day after injections to flush system Will have increased soreness for 12-48 hours after injections.  Can use Lidocaine patches the day AFTER injections Can use theracane on day of injections in places didn't inject Can use heating pad 4-6 hours AFTER injections   5. F/U in 3 months- double appt- SCI

## 2021-07-20 NOTE — Progress Notes (Signed)
Subjective:    Patient ID: [name], male    DOB: 12/09/1965, 56 y.o.   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  Here for f/u on SCI and trigger point injections.  Taking Norco as needed- last refill 11/22.  Tries not to take it- due to constipation and possible addiction risk.    One of his cousin's is addicted to pain meds. Died.  Takes rarely- usually takes tylenol- has 30+ pills left.   If tylenol doesn't work, will take [employer].  Weather makes his pain worse.    Wants injections today.   Gave Rx for catheters- however has a problem- hasn't been able to get catheters-    Got PRAFOS form [facility]  Pain Inventory Average Pain 6 Pain Right Now 6 My pain is intermittent, constant, and pressure & pulse pain  LOCATION OF PAIN  upper shoulders, back & neck, tired feet feeling  BOWEL Number of stools per week: 7 Oral laxative use Yes   Enema or suppository use Yes  History of colostomy No   BLADDER Normal In and out cath, frequency  every 4 hours  Able to self cath No    Mobility use a wheelchair needs help with transfers Do you have any goals in this area?  yes  Function disabled: date disabled 2020 I need assistance with the following:  dressing, bathing, meal prep, and household duties Do you have any goals in this area?  yes  Neuro/Psych numbness tremor tingling trouble walking spasms dizziness confusion depression anxiety  Prior Studies Any changes since last visit?  no  Physicians involved in your care Any changes since last visit?  no   Family History  Problem Relation Age of Onset   Diabetes Mother    Diabetes Father    Social History   Socioeconomic History   Marital status: Married    Spouse name: Not on file   Number of children: 4    Years of education: 6th grade   Highest education level: Not on file  Occupational History   Occupation: Disabled  Tobacco Use   Smoking status: Never   Smokeless tobacco: Never  Vaping Use   Vaping Use: Former  Substance and Sexual Activity   Alcohol use: Yes    Comment: social   Drug use: No   Sexual activity: Not on file  Other Topics Concern   Not on file  Social History Narrative   Lives at home with his family.   Right-handed.   No daily use of caffeine.   Social Determinants of Health   Financial Resource Strain: Not on file  Food Insecurity: Not on file  Transportation Needs: Not on file  Physical Activity: Not on file  Stress: Not on file  Social Connections: Not on file   Past Surgical History:  Procedure Laterality Date   CERVICAL SPINE SURGERY     Past Medical History:  Diagnosis Date   Diabetes mellitus without complication (HCC)    History of cervical fracture    BP 120/75   Pulse 70   Ht 5\' 10"  (1.778 m)   SpO2 96%   BMI 40.36 kg/m   Opioid Risk Score:   Fall Risk Score:  `1  Depression screen [facility] 2/9     10/02/2020  1:36 PM 07/28/2020   11:08 AM 04/26/2020   10:14 AM 09/06/2019   10:43 AM 06/02/2019    9:08 AM 05/27/2019   10:12 AM 02/01/2019    2:18 PM  Depression screen [facility] 2/9  Decreased Interest 0 0 0 0 0 1 1  Down, Depressed, Hopeless 0 0 0  0 1 1  PHQ - 2 Score 0 0 0 0 0 2 2  Altered sleeping    0  1   Tired, decreased energy    1  1   Change in appetite    0  1   Feeling bad or failure about yourself     1  1   Trouble concentrating    0     Moving slowly or fidgety/restless    1  0   Suicidal thoughts    0  1   PHQ-9 Score    3  7     Review of Systems  Musculoskeletal:  Positive for back pain, gait problem and neck pain.  All other systems reviewed and are negative.      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  Here for f/u on SCI and trigger point injections.  Suggest massage gun for back and neck pain.   2.   Gets foley and catheters- from [facility]- [phone]- needs Rx sent to them- gave Rx last visit, but gave to drug store- needs foley still- changes bag 4x/month.  Uses for occasions like graduation, etc- otherwise does in/outh caths- 6 catheters/day- #180- they are sending in/out caths.   3. Doesn't need refillls On Eliquis  Baclofen, Gabapentin, will continue at this time.   4. Patient here for trigger point injections for  Consent done and on chart.  Cleaned areas with alcohol and injected using a 27 gauge 1.5 inch needle  Injected 5cc Using 1% Lidocaine with no EPI  Upper traps B/L  Levators- B/L  Posterior scalenes Middle scalenes- B/L  Splenius Capitus- B/L  Pectoralis Major Rhomboids- B/L x2 Infraspinatus Teres Major/minor- B/L  Thoracic paraspinals Lumbar paraspinals Other injections- B/L deltoids and forearms- extensor compartment B/L    Patient's level of pain prior was 7/10 Current level of pain after injections is 5/10  There was no bleeding or complications.  Patient was advised to drink a lot of water on day after injections to flush system Will have increased soreness for 12-48 hours after injections.  Can use Lidocaine patches the day AFTER injections Can use theracane on day of injections in places didn't inject Can use heating pad 4-6 hours AFTER injections   5. F/U in 3 months- double appt- SCI

## 2021-08-06 ENCOUNTER — Emergency Department (HOSPITAL_COMMUNITY): Payer: Medicare Other

## 2021-08-06 ENCOUNTER — Encounter (HOSPITAL_COMMUNITY): Payer: Self-pay | Admitting: Radiology

## 2021-08-06 ENCOUNTER — Inpatient Hospital Stay (HOSPITAL_COMMUNITY)
Admission: EM | Admit: 2021-08-06 | Discharge: 2021-08-11 | DRG: 617 | Disposition: A | Discharge status: Home / Self Care | Payer: Medicare Other | Attending: Internal Medicine | Admitting: Internal Medicine

## 2021-08-06 ENCOUNTER — Other Ambulatory Visit: Payer: Self-pay

## 2021-08-06 DIAGNOSIS — N3001 Acute cystitis with hematuria: Secondary | ICD-10-CM | POA: Diagnosis not present

## 2021-08-06 DIAGNOSIS — N3000 Acute cystitis without hematuria: Secondary | ICD-10-CM

## 2021-08-06 DIAGNOSIS — E1169 Type 2 diabetes mellitus with other specified complication: Secondary | ICD-10-CM | POA: Diagnosis not present

## 2021-08-06 DIAGNOSIS — B9620 Unspecified Escherichia coli [E. coli] as the cause of diseases classified elsewhere: Secondary | ICD-10-CM | POA: Diagnosis present

## 2021-08-06 DIAGNOSIS — M869 Osteomyelitis, unspecified: Secondary | ICD-10-CM | POA: Diagnosis not present

## 2021-08-06 DIAGNOSIS — K592 Neurogenic bowel, not elsewhere classified: Secondary | ICD-10-CM | POA: Diagnosis present

## 2021-08-06 DIAGNOSIS — N200 Calculus of kidney: Secondary | ICD-10-CM | POA: Diagnosis present

## 2021-08-06 DIAGNOSIS — L97529 Non-pressure chronic ulcer of other part of left foot with unspecified severity: Secondary | ICD-10-CM | POA: Diagnosis present

## 2021-08-06 DIAGNOSIS — Z79899 Other long term (current) drug therapy: Secondary | ICD-10-CM

## 2021-08-06 DIAGNOSIS — Z1612 Extended spectrum beta lactamase (ESBL) resistance: Secondary | ICD-10-CM | POA: Diagnosis present

## 2021-08-06 DIAGNOSIS — E11621 Type 2 diabetes mellitus with foot ulcer: Secondary | ICD-10-CM | POA: Diagnosis present

## 2021-08-06 DIAGNOSIS — L89312 Pressure ulcer of right buttock, stage 2: Secondary | ICD-10-CM | POA: Diagnosis present

## 2021-08-06 DIAGNOSIS — L89322 Pressure ulcer of left buttock, stage 2: Secondary | ICD-10-CM | POA: Diagnosis present

## 2021-08-06 DIAGNOSIS — B9629 Other Escherichia coli [E. coli] as the cause of diseases classified elsewhere: Secondary | ICD-10-CM

## 2021-08-06 DIAGNOSIS — Z981 Arthrodesis status: Secondary | ICD-10-CM | POA: Diagnosis not present

## 2021-08-06 DIAGNOSIS — E669 Obesity, unspecified: Secondary | ICD-10-CM | POA: Diagnosis present

## 2021-08-06 DIAGNOSIS — Z7984 Long term (current) use of oral hypoglycemic drugs: Secondary | ICD-10-CM

## 2021-08-06 DIAGNOSIS — M62838 Other muscle spasm: Secondary | ICD-10-CM | POA: Diagnosis present

## 2021-08-06 DIAGNOSIS — G8250 Quadriplegia, unspecified: Secondary | ICD-10-CM | POA: Diagnosis present

## 2021-08-06 DIAGNOSIS — M8610 Other acute osteomyelitis, unspecified site: Secondary | ICD-10-CM | POA: Diagnosis present

## 2021-08-06 DIAGNOSIS — E119 Type 2 diabetes mellitus without complications: Secondary | ICD-10-CM

## 2021-08-06 DIAGNOSIS — Z86718 Personal history of other venous thrombosis and embolism: Secondary | ICD-10-CM | POA: Diagnosis not present

## 2021-08-06 DIAGNOSIS — M4628 Osteomyelitis of vertebra, sacral and sacrococcygeal region: Secondary | ICD-10-CM | POA: Diagnosis present

## 2021-08-06 DIAGNOSIS — L8940 Pressure ulcer of contiguous site of back, buttock and hip, unspecified stage: Secondary | ICD-10-CM

## 2021-08-06 DIAGNOSIS — Z7901 Long term (current) use of anticoagulants: Secondary | ICD-10-CM | POA: Diagnosis not present

## 2021-08-06 DIAGNOSIS — M86172 Other acute osteomyelitis, left ankle and foot: Secondary | ICD-10-CM | POA: Diagnosis not present

## 2021-08-06 DIAGNOSIS — N319 Neuromuscular dysfunction of bladder, unspecified: Secondary | ICD-10-CM | POA: Diagnosis present

## 2021-08-06 DIAGNOSIS — Z833 Family history of diabetes mellitus: Secondary | ICD-10-CM

## 2021-08-06 DIAGNOSIS — Z6839 Body mass index (BMI) 39.0-39.9, adult: Secondary | ICD-10-CM | POA: Diagnosis not present

## 2021-08-06 DIAGNOSIS — N390 Urinary tract infection, site not specified: Secondary | ICD-10-CM

## 2021-08-06 DIAGNOSIS — L8990 Pressure ulcer of unspecified site, unspecified stage: Secondary | ICD-10-CM | POA: Insufficient documentation

## 2021-08-06 LAB — CBC WITH DIFFERENTIAL/PLATELET
Abs Immature Granulocytes: 0.04 10*3/uL (ref 0.00–0.07)
Basophils Absolute: 0.1 10*3/uL (ref 0.0–0.1)
Basophils Relative: 1 %
Eosinophils Absolute: 0.3 10*3/uL (ref 0.0–0.5)
Eosinophils Relative: 4 %
HCT: 46.5 % (ref 39.0–52.0)
Hemoglobin: 15.8 g/dL (ref 13.0–17.0)
Immature Granulocytes: 1 %
Lymphocytes Relative: 29 %
Lymphs Abs: 2.4 10*3/uL (ref 0.7–4.0)
MCH: 30.9 pg (ref 26.0–34.0)
MCHC: 34 g/dL (ref 30.0–36.0)
MCV: 90.8 fL (ref 80.0–100.0)
Monocytes Absolute: 0.6 10*3/uL (ref 0.1–1.0)
Monocytes Relative: 7 %
Neutro Abs: 4.8 10*3/uL (ref 1.7–7.7)
Neutrophils Relative %: 58 %
Platelets: 246 10*3/uL (ref 150–400)
RBC: 5.12 MIL/uL (ref 4.22–5.81)
RDW: 12.7 % (ref 11.5–15.5)
WBC: 8.2 10*3/uL (ref 4.0–10.5)
nRBC: 0 % (ref 0.0–0.2)

## 2021-08-06 LAB — COMPREHENSIVE METABOLIC PANEL
ALT: 37 U/L (ref 0–44)
AST: 29 U/L (ref 15–41)
Albumin: 4.1 g/dL (ref 3.5–5.0)
Alkaline Phosphatase: 75 U/L (ref 38–126)
Anion gap: 13 (ref 5–15)
BUN: <5 mg/dL — ABNORMAL LOW (ref 6–20)
CO2: 25 mmol/L (ref 22–32)
Calcium: 9.4 mg/dL (ref 8.9–10.3)
Chloride: 103 mmol/L (ref 98–111)
Creatinine, Ser: 0.54 mg/dL — ABNORMAL LOW (ref 0.61–1.24)
GFR, Estimated: >60 mL/min (ref >60)
Glucose, Bld: 177 mg/dL — ABNORMAL HIGH (ref 70–99)
Potassium: 4 mmol/L (ref 3.5–5.1)
Sodium: 141 mmol/L (ref 135–145)
Total Bilirubin: 0.5 mg/dL (ref 0.3–1.2)
Total Protein: 8.1 g/dL (ref 6.5–8.1)

## 2021-08-06 LAB — SEDIMENTATION RATE: Sed Rate: 19 mm/hr — ABNORMAL HIGH (ref 0–16)

## 2021-08-06 LAB — URINALYSIS, ROUTINE W REFLEX MICROSCOPIC
Bilirubin Urine: NEGATIVE
Glucose, UA: NEGATIVE mg/dL
Ketones, ur: NEGATIVE mg/dL
Nitrite: POSITIVE — AB
Protein, ur: 100 mg/dL — AB
RBC / HPF: >50 RBC/hpf — ABNORMAL HIGH (ref 0–5)
Specific Gravity, Urine: 1.016 (ref 1.005–1.030)
WBC, UA: >50 WBC/hpf — ABNORMAL HIGH (ref 0–5)
pH: 6 (ref 5.0–8.0)

## 2021-08-06 LAB — C-REACTIVE PROTEIN: CRP: 2 mg/dL — ABNORMAL HIGH (ref <1.0)

## 2021-08-06 LAB — LIPASE, BLOOD: Lipase: 30 U/L (ref 11–51)

## 2021-08-06 LAB — HIV ANTIBODY (ROUTINE TESTING W REFLEX): HIV Screen 4th Generation wRfx: NONREACTIVE

## 2021-08-06 LAB — GLUCOSE, CAPILLARY: Glucose-Capillary: 164 mg/dL — ABNORMAL HIGH (ref 70–99)

## 2021-08-06 LAB — CK: Total CK: 71 U/L (ref 49–397)

## 2021-08-06 MED ORDER — INSULIN ASPART 100 UNIT/ML IJ SOLN
0.0000 [IU] | Freq: Three times a day (TID) | INTRAMUSCULAR | Status: DC
  Administered 2021-08-07: 3 [IU] via SUBCUTANEOUS
  Administered 2021-08-07: 2 [IU] via SUBCUTANEOUS
  Administered 2021-08-07 – 2021-08-08 (×2): 3 [IU] via SUBCUTANEOUS
  Administered 2021-08-08 – 2021-08-09 (×3): 4 [IU] via SUBCUTANEOUS
  Administered 2021-08-09: 7 [IU] via SUBCUTANEOUS
  Administered 2021-08-09: 4 [IU] via SUBCUTANEOUS
  Administered 2021-08-10: 3 [IU] via SUBCUTANEOUS
  Administered 2021-08-11: 7 [IU] via SUBCUTANEOUS
  Administered 2021-08-11: 4 [IU] via SUBCUTANEOUS

## 2021-08-06 MED ORDER — BISACODYL 10 MG RE SUPP
10.0000 mg | Freq: Every day | RECTAL | Status: DC
  Administered 2021-08-06 – 2021-08-10 (×5): 10 mg via RECTAL
  Filled 2021-08-06 (×5): qty 1

## 2021-08-06 MED ORDER — POLYETHYLENE GLYCOL 3350 17 G PO PACK: 17.0000 g | PACK | Freq: Every day | ORAL | Status: DC | PRN

## 2021-08-06 MED ORDER — INSULIN ASPART 100 UNIT/ML IJ SOLN
0.0000 [IU] | Freq: Every day | INTRAMUSCULAR | Status: DC
  Administered 2021-08-10: 2 [IU] via SUBCUTANEOUS

## 2021-08-06 MED ORDER — ACETAMINOPHEN 325 MG PO TABS
650.0000 mg | ORAL_TABLET | Freq: Four times a day (QID) | ORAL | Status: DC | PRN
  Administered 2021-08-06 – 2021-08-11 (×4): 650 mg via ORAL
  Filled 2021-08-06 (×4): qty 2

## 2021-08-06 MED ORDER — FLUOXETINE HCL 20 MG PO CAPS
20.0000 mg | ORAL_CAPSULE | Freq: Every day | ORAL | Status: DC
  Administered 2021-08-07 – 2021-08-11 (×5): 20 mg via ORAL
  Filled 2021-08-06 (×5): qty 1

## 2021-08-06 MED ORDER — VANCOMYCIN HCL 2000 MG/400ML IV SOLN
2000.0000 mg | Freq: Once | INTRAVENOUS | Status: AC
  Administered 2021-08-06: 2000 mg via INTRAVENOUS
  Filled 2021-08-06: qty 400

## 2021-08-06 MED ORDER — VANCOMYCIN HCL 1500 MG/300ML IV SOLN
1500.0000 mg | Freq: Two times a day (BID) | INTRAVENOUS | Status: DC
  Administered 2021-08-07: 1500 mg via INTRAVENOUS
  Filled 2021-08-06: qty 300

## 2021-08-06 MED ORDER — MUPIROCIN 2 % EX OINT
1.0000 | TOPICAL_OINTMENT | Freq: Two times a day (BID) | CUTANEOUS | Status: AC
  Administered 2021-08-06 – 2021-08-11 (×10): 1 via NASAL
  Filled 2021-08-06 (×3): qty 22

## 2021-08-06 MED ORDER — GABAPENTIN 600 MG PO TABS
600.0000 mg | ORAL_TABLET | Freq: Three times a day (TID) | ORAL | Status: DC
  Administered 2021-08-06 – 2021-08-11 (×14): 600 mg via ORAL
  Filled 2021-08-06 (×14): qty 1

## 2021-08-06 MED ORDER — ACETAMINOPHEN 650 MG RE SUPP: 650.0000 mg | Freq: Four times a day (QID) | RECTAL | Status: DC | PRN

## 2021-08-06 MED ORDER — IOHEXOL 300 MG/ML  SOLN
100.0000 mL | Freq: Once | INTRAMUSCULAR | Status: AC | PRN
  Administered 2021-08-06: 100 mL via INTRAVENOUS

## 2021-08-06 MED ORDER — BACLOFEN 10 MG PO TABS
10.0000 mg | ORAL_TABLET | Freq: Three times a day (TID) | ORAL | Status: DC | PRN
  Administered 2021-08-09: 10 mg via ORAL
  Filled 2021-08-06: qty 1

## 2021-08-06 MED ORDER — CEFTRIAXONE SODIUM 2 G IJ SOLR
2.0000 g | Freq: Once | INTRAMUSCULAR | Status: AC
Start: 2021-08-06 — End: 2021-08-06
  Administered 2021-08-06: 2 g via INTRAVENOUS
  Filled 2021-08-06: qty 20

## 2021-08-06 NOTE — H&P (Signed)
Date: 08/06/2021               Patient Name:  [name] MRN: [id number]  DOB: Mar 20, 1965 Age / Sex: 56 y.o., male   PCP: Medicine, [facility]         Medical Service: Internal Medicine Teaching Service         Attending Physician: Dr. Mercie Eon, MD    First Contact: Dr. Adron Bene Pager: [phone]   Second Contact: Dr. Doran Stabler  Pager: [phone]       After Hours (After 5p/  First Contact Pager: [phone]  weekends / holidays): Second Contact Pager: [phone]   Chief Complaint: Abdominal pressure, hematuria, left foot wound  History of Present Illness:   [name] is a 56 year old male with spinal cord injury C7 2020, quadriplegic, DVT on Eliquis, type 2 diabetes, status post cervical thoracic reduction plus posterior spinal fusion surgery 2019, who presents to the emergency room for worsening abdominal spasm and nausea.  Patient does not have any sensation from mid chest down.  He endorses abdominal spasm anteriorly that caused him to be nauseous and suffocating.  His wife reports hematuria that started about 1 week ago.  She catheterizes him every day and denies any resistant with the catheter.  She spoke with his PCP who stated that it was secondary to the Eliquis.  Wife states that hematuria only happens once every few months but has has been every day since 1 week ago.  Wife said that she gave him a suppository at night for bowel movement.  Reports daily bowel movement.  She also noticed the left lateral foot wound many days ago.  It started as a wart and getting bigger.  His foot doctor has seen it and gave him a cushion shoe.  He has had foot wound in the past but never needed surgery.  Patient denies fever but endorses chills and sweating in the last few days, which is not normal for him.  Meds:  Current Meds  Medication Sig   acetaminophen (TYLENOL) 500 MG tablet Take 500 mg by mouth every 6 (six) hours as needed for mild pain.    apixaban (ELIQUIS) 5 MG TABS tablet Take 1 tablet (5 mg total) by mouth 2 (two) times daily. Please make sure pt has THREE refills- wrote this 3 months ago and bottles says no refills   baclofen (LIORESAL) 10 MG tablet TAKE 1 TABLET BY MOUTH 3 TIMES DAILY as needed for muscle spasm/tightness   BISACODYL LAXATIVE RE Place 1 suppository rectally at bedtime.   Carboxymethylcellulose Sodium (ARTIFICIAL TEARS OP) Apply 1-2 drops to eye daily as needed (dry eyes).      Allergies: Allergies as of 08/06/2021   (No Known Allergies)   Past Medical History:  Diagnosis Date   Diabetes mellitus without complication (HCC)    History of cervical fracture     Family History:  Family History  Problem Relation Age of Onset   Diabetes Mother    Diabetes Father      Social History:  Lives with his wife, who helps him with medication and ADLs Denies smoking history Only drinks alcohol socially Denies substance use He does have a PCP but wife cannot remember the name  Review of Systems: A complete ROS was negative except as per HPI.   Physical Exam: Blood pressure 132/82, pulse 64, temperature 98.1 F (36.7 C), temperature source Oral, resp. rate 15, SpO2 95 %. Physical Exam Constitutional:  General: He is not in acute distress. HENT:     Head: Normocephalic.  Eyes:     General:        Right eye: No discharge.        Left eye: No discharge.     Conjunctiva/sclera: Conjunctivae normal.  Cardiovascular:     Rate and Rhythm: Normal rate and regular rhythm.  Pulmonary:     Effort: Pulmonary effort is normal. No respiratory distress.     Breath sounds: Normal breath sounds. No wheezing.  Abdominal:     Comments: Very tight muscle of the anterior abdominal wall.  Bowel sound present.  Cannot examine his back given his position on the wheelchair.  Musculoskeletal:     Cervical back: Normal range of motion.     Comments: Quadriplegic. Left lateral foot circular wound with  surrounding erythema. Bilateral +2 pedis pulses palpated  Skin:    General: Skin is warm.  Neurological:     Mental Status: He is alert and oriented to person, place, and time.  Psychiatric:        Mood and Affect: Mood normal.        Behavior: Behavior normal.         Assessment & Plan by Problem: Principal Problem:   Acute osteomyelitis (HCC) Active Problems:   Quadriplegia (HCC)   DM2 (diabetes mellitus, type 2) (HCC)   History of DVT (deep vein thrombosis)   Acute lower UTI  [name] is a 56 year old male with spinal cord injury C7 2020, quadriplegic, DVT/PE on Eliquis, type 2 diabetes, status post cervical thoracic reduction plus posterior spinal fusion surgery 2019, who presents to the emergency room for worsening abdominal spasm, nausea left foot wound, currently working up for osteomyelitis and UTI.  Left foot osteomyelitis Evidence of osteomyelitis seen on left foot x-ray.  Patient is diabetic and also quadriplegic which put him at higher risk.  He is not septic but certainly can be at risk.  Unfortunately antibiotics was started in the ED without obtaining blood culture.  Orthopedic was consulted and will see patient either tonight or tomorrow. -Obtain blood culture -Patient already got vancomycin and ceftriaxone today.  We will hold off on additional antibiotic to obtain better yield.  She is currently hemodynamically stable. -Appreciate orthopedic recommendation -Holding Eliquis tonight for possible surgery tomorrow -N.p.o. after midnight  Possible UTI Hematuria Abdominal spasm His UA showed hematuria with pyuria.  He is at risk for UTI due to self-catheterization.  Fortunately there was no evidence of pyelonephritis on CT abdomen pelvis.  He does have a left renal pelvis stone but no obstruction.  His abdominal spasm could be a symptom of UTI. -Pending urine culture -We will resume ceftriaxone after possible surgery with Ortho. -Continue self-catheterization   -Baclofen as needed for spasm  Type 2 diabetes Home regimen includes Janumet, metformin, and glipizide. -Obtain A1c -Sliding scale insulin for now  Spinal cord injury Quadriplegia -Baclofen and gabapentin -Suppository nightly  History of DVT Holding Eliquis for possible surgery  Carb modified diet Full code IVF: N/A DVT: SCD  Dispo: Admit patient to Inpatient with expected length of stay greater than 2 midnights.  Signed: Doran Stabler, DO 08/06/2021, 7:09 PM  Pager: [phone] After 5pm on weekdays and 1pm on weekends: On Call pager: [phone]

## 2021-08-06 NOTE — ED Notes (Signed)
Pt requesting to stay in his chair so that he only has to be moved once. Pads underneath patient.

## 2021-08-06 NOTE — Progress Notes (Addendum)
Pharmacy Antibiotic Note  [name] is a 56 y.o. male for which pharmacy has been consulted for vancomycin dosing for  osteomyelitis .  Patient with a history of spinal cord injury resulting in quadriplegia, neurogenic bowel/bladder, type 2 diabetes. Patient presenting with T2DM.  SCr 0.54 - near baseline WBC 8.2; T 98.1 F; HR 64; RR 15  Plan: Ceftriaxone x 1 ordered in the ED by EDP (maintenance doses not ordered at this time) Vancomycin 2000 mg once  in ED  IMTS  wants to hold subsequent ABX dosing until after surgery 6/27 -- pharmacy to follow Trend WBC, Fever, Renal function, & Clinical course F/u cultures, clinical course, WBC, fever De-escalate when able Levels at steady state     Temp (24hrs), Avg:98.1 F (36.7 C), Min:98.1 F (36.7 C), Max:98.1 F (36.7 C)  Recent Labs  Lab 08/06/21 1259  WBC 8.2  CREATININE 0.54*    CrCl cannot be calculated (Unknown ideal weight.).    No Known Allergies  Antimicrobials this admission: ceftriaxone 6/26 >> Hold vancomycin 6/26 >> Hold  Microbiology results: Pending  Thank you for allowing pharmacy to be a part of this patient's care.  Delmar Landau, PharmD, BCPS 08/06/2021 3:34 PM ED Clinical Pharmacist -  [phone]

## 2021-08-06 NOTE — ED Provider Triage Note (Signed)
Emergency Medicine Provider Triage Evaluation Note  [name] , a 56 y.o. male  was evaluated in triage.  Pt complains of feeling like his abdomen is "jumping."  Patient is quadriplegic so unable to feel pain but states that he has not had this sensation before.  Reports nausea but denies any vomiting.  No changes to bowel movements.  Reports possible blood-tinged urine.  No sick contacts with similar symptoms  Review of Systems  Positive: Abdominal discomfort, hematuria, nausea Negative: Vomiting, diarrhea, constipation  Physical Exam  There were no vitals taken for this visit. Gen:   Awake, no distress   Resp:  Normal effort  MSK:   Moves extremities without difficulty  Other:    Medical Decision Making  Medically screening exam initiated at 12:46 PM.  Appropriate orders placed.  [name] was informed that the remainder of the evaluation will be completed by another provider, this initial triage assessment does not replace that evaluation, and the importance of remaining in the ED until their evaluation is complete.  We will order labs and urinalysis   Dietrich Pates, PA-C 08/06/21 1247

## 2021-08-07 ENCOUNTER — Encounter (HOSPITAL_COMMUNITY): Payer: Self-pay | Admitting: Internal Medicine

## 2021-08-07 DIAGNOSIS — N3000 Acute cystitis without hematuria: Secondary | ICD-10-CM

## 2021-08-07 DIAGNOSIS — L8990 Pressure ulcer of unspecified site, unspecified stage: Secondary | ICD-10-CM | POA: Insufficient documentation

## 2021-08-07 DIAGNOSIS — E1169 Type 2 diabetes mellitus with other specified complication: Secondary | ICD-10-CM | POA: Diagnosis not present

## 2021-08-07 DIAGNOSIS — M8610 Other acute osteomyelitis, unspecified site: Secondary | ICD-10-CM

## 2021-08-07 DIAGNOSIS — N3001 Acute cystitis with hematuria: Secondary | ICD-10-CM

## 2021-08-07 DIAGNOSIS — M869 Osteomyelitis, unspecified: Secondary | ICD-10-CM

## 2021-08-07 DIAGNOSIS — Z7984 Long term (current) use of oral hypoglycemic drugs: Secondary | ICD-10-CM

## 2021-08-07 DIAGNOSIS — M86172 Other acute osteomyelitis, left ankle and foot: Secondary | ICD-10-CM

## 2021-08-07 LAB — CBC
HCT: 40.5 % (ref 39.0–52.0)
Hemoglobin: 13.8 g/dL (ref 13.0–17.0)
MCH: 30.5 pg (ref 26.0–34.0)
MCHC: 34.1 g/dL (ref 30.0–36.0)
MCV: 89.6 fL (ref 80.0–100.0)
Platelets: 224 10*3/uL (ref 150–400)
RBC: 4.52 MIL/uL (ref 4.22–5.81)
RDW: 12.9 % (ref 11.5–15.5)
WBC: 6.3 10*3/uL (ref 4.0–10.5)
nRBC: 0 % (ref 0.0–0.2)

## 2021-08-07 LAB — COMPREHENSIVE METABOLIC PANEL
ALT: 30 U/L (ref 0–44)
AST: 24 U/L (ref 15–41)
Albumin: 3.4 g/dL — ABNORMAL LOW (ref 3.5–5.0)
Alkaline Phosphatase: 82 U/L (ref 38–126)
Anion gap: 7 (ref 5–15)
BUN: 7 mg/dL (ref 6–20)
CO2: 26 mmol/L (ref 22–32)
Calcium: 8.8 mg/dL — ABNORMAL LOW (ref 8.9–10.3)
Chloride: 103 mmol/L (ref 98–111)
Creatinine, Ser: 0.58 mg/dL — ABNORMAL LOW (ref 0.61–1.24)
GFR, Estimated: >60 mL/min (ref >60)
Glucose, Bld: 189 mg/dL — ABNORMAL HIGH (ref 70–99)
Potassium: 3.7 mmol/L (ref 3.5–5.1)
Sodium: 136 mmol/L (ref 135–145)
Total Bilirubin: 0.8 mg/dL (ref 0.3–1.2)
Total Protein: 6.6 g/dL (ref 6.5–8.1)

## 2021-08-07 LAB — URINE CULTURE

## 2021-08-07 LAB — SURGICAL PCR SCREEN
MRSA, PCR: NEGATIVE
Staphylococcus aureus: POSITIVE — AB

## 2021-08-07 LAB — GLUCOSE, CAPILLARY
Glucose-Capillary: 145 mg/dL — ABNORMAL HIGH (ref 70–99)
Glucose-Capillary: 163 mg/dL — ABNORMAL HIGH (ref 70–99)

## 2021-08-07 MED ORDER — CEFTRIAXONE SODIUM 2 G IJ SOLR: 2.0000 g | INTRAMUSCULAR | Status: DC

## 2021-08-07 MED ORDER — APIXABAN 5 MG PO TABS
5.0000 mg | ORAL_TABLET | Freq: Two times a day (BID) | ORAL | Status: DC
  Administered 2021-08-07 – 2021-08-08 (×4): 5 mg via ORAL
  Filled 2021-08-07 (×4): qty 1

## 2021-08-07 NOTE — H&P (View-Only) (Signed)
ORTHOPAEDIC CONSULTATION  REQUESTING PHYSICIAN: Lottie Mussel, MD  Chief Complaint: Chronic draining ulcer fifth metatarsal head left foot.  HPI: [name] is a 56 y.o. 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  Patient has type 2 diabetes history of C7 cervical spine fracture and cervical spine surgery with quadriplegia.  Past Medical History:  Diagnosis Date   Diabetes mellitus without complication ([place])    History of cervical fracture    Past Surgical History:  Procedure Laterality Date   CERVICAL SPINE SURGERY     Social History   Socioeconomic History   Marital status: Married    Spouse name: Not on file   Number of children: 4   Years of education: 6th grade   Highest education level: Not on file  Occupational History   Occupation: Disabled  Tobacco Use   Smoking status: Never   Smokeless tobacco: Never  Vaping Use   Vaping Use: Former  Substance and Sexual Activity   Alcohol use: Yes    Comment: social   Drug use: No   Sexual activity: Not on file  Other Topics Concern   Not on file  Social History Narrative   Lives at home with his family.   Right-handed.   No daily use of caffeine.   Social Determinants of Health   Financial Resource Strain: Not on file  Food Insecurity: Not on file  Transportation Needs: Not on file  Physical Activity: Not on file  Stress: Not on file  Social Connections: Not on file   Family History  Problem Relation Age of Onset   Diabetes Mother    Diabetes Father    - negative except otherwise stated in the family history section No Known Allergies Prior to Admission medications   Medication Sig Start Date End Date Taking? Authorizing Provider  acetaminophen (TYLENOL) 500 MG tablet Take 500 mg by mouth every 6 (six) hours as needed for mild pain.   Yes [provider]  apixaban (ELIQUIS) 5 MG TABS tablet Take 1 tablet (5 mg total) by mouth 2 (two) times daily. Please  make sure pt has THREE refills- wrote this 3 months ago and bottles says no refills 04/09/21  Yes Lovorn, Jinny Blossom, MD  baclofen (LIORESAL) 10 MG tablet TAKE 1 TABLET BY MOUTH 3 TIMES DAILY as needed for muscle spasm/tightness 04/09/21  Yes Lovorn, Jinny Blossom, MD  BISACODYL LAXATIVE RE Place 1 suppository rectally at bedtime.   Yes [provider]  Carboxymethylcellulose Sodium (ARTIFICIAL TEARS OP) Apply 1-2 drops to eye daily as needed (dry eyes).    Yes [provider]  FLUoxetine (PROZAC) 20 MG capsule Has 3 refills!!!!! TAKE 1 CAPSULE BY MOUTH DAILY Needs appt for refills 04/09/21  Yes Lovorn, Megan, MD  gabapentin (NEURONTIN) 600 MG tablet TAKE 1 TABLET BY MOUTH 3 TIMES DAILY 01/23/21  Yes Lovorn, Jinny Blossom, MD  glipiZIDE (GLUCOTROL) 10 MG tablet Take 10 mg by mouth 2 (two) times daily. 04/08/20  Yes [provider]  HYDROcodone-acetaminophen (NORCO) 5-325 MG tablet Take 1 tablet by mouth every 6 (six) hours as needed for moderate pain. 01/01/21  Yes Lovorn, Megan, MD  JANUVIA 100 MG tablet Take 100 mg by mouth daily. 02/14/21  Yes [provider]  metFORMIN (GLUCOPHAGE) 1000 MG tablet SMARTSIG:1 Tablet(s) By Mouth Morning-Evening 02/14/21  Yes [provider]  sitaGLIPtin-metformin (JANUMET) 50-1000 MG tablet Take 1 tablet by mouth 2 (two) times daily with a meal. 05/27/19  Yes Kerin Perna, NP  blood glucose meter kit and supplies Dispense based on patient and insurance preference. Use up to four times daily as directed. (FOR ICD-10 E10.9, E11.9). 05/27/19   Kerin Perna, NP  furosemide (LASIX) 40 MG tablet Take 1 tablet daily for 6 days, take as needed after that for swelling or weight gain Patient not taking: Reported on 08/07/2021 09/25/19   Lavina Hamman, MD  hydrOXYzine (ATARAX/VISTARIL) 25 MG tablet Take 1 tablet (25 mg total) by mouth 3 (three) times daily as needed for itching. Patient not taking: Reported on 08/07/2021 09/25/19   Lavina Hamman, MD   sildenafil (VIAGRA) 100 MG tablet Take 1 tablet (100 mg total) by mouth daily as needed for erectile dysfunction. Patient not taking: Reported on 08/07/2021 01/01/21   Courtney Heys, MD   CT ABDOMEN PELVIS W CONTRAST  Result Date: 08/06/2021 CLINICAL DATA:  Flank pain, kidney stone suspected Abdominal pain, acute, nonlocalized EXAM: CT ABDOMEN AND PELVIS WITH CONTRAST TECHNIQUE: Multidetector CT imaging of the abdomen and pelvis was performed using the standard protocol following bolus administration of intravenous contrast. RADIATION DOSE REDUCTION: This exam was performed according to the departmental dose-optimization program which includes automated exposure control, adjustment of the mA and/or kV according to patient size and/or use of iterative reconstruction technique. CONTRAST:  110m OMNIPAQUE IOHEXOL 300 MG/ML  SOLN COMPARISON:  10/04/2004 FINDINGS: Lower chest: Linear scarring or atelectasis in the lung bases. Hepatobiliary: Diffuse low-density throughout the liver compatible with fatty infiltration. No focal abnormality. Gallbladder unremarkable. Pancreas: No focal abnormality or ductal dilatation. Spleen: No focal abnormality.  Normal size. Adrenals/Urinary Tract: 6 mm stone in the left renal pelvis. No ureteral stones or hydronephrosis bilaterally. No renal or adrenal mass. Urinary bladder unremarkable. Stomach/Bowel: Normal appendix. Stomach, large and small bowel grossly unremarkable. Vascular/Lymphatic: No evidence of aneurysm or adenopathy. Infrarenal IVC filter in place. Reproductive: No visible focal abnormality. Other: No free fluid or free air. Musculoskeletal: No acute bony abnormality. IMPRESSION: Left nephrolithiasis.  No ureteral stones or hydronephrosis. Hepatic steatosis. No acute findings in the abdomen or pelvis. Electronically Signed   By: KRolm BaptiseM.D.   On: 08/06/2021 17:29   DG Foot Complete Left  Result Date: 08/06/2021 CLINICAL DATA:  Left lateral foot ulcer EXAM:  LEFT FOOT - COMPLETE 3+ VIEW COMPARISON:  10/04/2004 09/22/2019 FINDINGS: There is lateral subluxation of the fifth digit at the metatarsophalangeal joint. Erosion of the head of the fifth metatarsal consistent with osteomyelitis. Moderate swelling of the mid and forefoot soft tissues. IMPRESSION: Erosion of the head of the fifth metatarsal is consistent with osteomyelitis. Electronically Signed   By: FMiachel RouxM.D.   On: 08/06/2021 15:01   - pertinent xrays, CT, MRI studies were reviewed and independently interpreted  Positive ROS: All other systems have been reviewed and were otherwise negative with the exception of those mentioned in the HPI and as above.  Physical Exam: General: Alert, no acute distress Psychiatric: Patient is competent for consent with normal mood and affect Lymphatic: No axillary or cervical lymphadenopathy Cardiovascular: No pedal edema Respiratory: No cyanosis, no use of accessory musculature GI: No organomegaly, abdomen is soft and non-tender    Images:  '@ENCIMAGES' @  Labs:  Lab Results  Component Value Date   HGBA1C 8.2 (H) 04/26/2020   HGBA1C 6.5 (A) 09/06/2019   HGBA1C 9.8 (A) 05/27/2019   ESRSEDRATE 19 (H) 08/06/2021   ESRSEDRATE 39 (H) 09/22/2019   CRP 2.0 (H) 08/06/2021   CRP 1.7 (H) 09/22/2019  REPTSTATUS PENDING 08/06/2021   CULT  08/06/2021    NO GROWTH < 12 HOURS Performed at [facility] [address], [place],  [address]    LABORGA ESCHERICHIA COLI (A) 04/26/2020   LABORGA KLEBSIELLA PNEUMONIAE (A) 04/26/2020    Lab Results  Component Value Date   ALBUMIN 3.4 (L) 08/07/2021   ALBUMIN 4.1 08/06/2021   ALBUMIN 3.3 (L) 04/26/2020   PREALBUMIN 12.9 (L) 09/23/2019        Latest Ref Rng & Units 08/07/2021    5:44 AM 08/06/2021   12:59 PM 04/27/2020    2:11 AM  CBC EXTENDED  WBC 4.0 - 10.5 K/uL 6.3  8.2  5.3   RBC 4.22 - 5.81 MIL/uL 4.52  5.12  4.36   Hemoglobin 13.0 - 17.0 g/dL 13.8  15.8  13.5   HCT 39.0 - 52.0 %  40.5  46.5  40.2   Platelets 150 - 400 K/uL 224  246  211   NEUT# 1.7 - 7.7 K/uL  4.8    Lymph# 0.7 - 4.0 K/uL  2.4      Neurologic: Patient does not have protective sensation bilateral lower extremities.   MUSCULOSKELETAL:   Skin: Examination patient has a draining ulcer with atrophy of the soft tissue over the fifth metatarsal head left foot.  Patient has a strong palpable dorsalis pedis pulse.  Review of the radiographs shows destructive bony changes of the fifth metatarsal head consistent with chronic osteomyelitis.  Albumin 3.4 white cell count 6.3 with a hemoglobin of 13.8.  Patient's hemoglobin A1c is 8.2.  Assessment: Osteomyelitis fifth metatarsal head left foot with ulceration.  Plan: We will plan for fifth ray amputation on Friday.  Risks and benefits of surgery were discussed through the interpreter.  Thank you for the consult and the opportunity to see Mr. Theadore Nan, MD [facility] [phone] 10:27 AM

## 2021-08-07 NOTE — Progress Notes (Signed)
HD#1 Subjective:  Overnight Events: no event  Patient appears comfortable and in no acute distress.  Reports sleeping well and eating normally.  States that nothing really changed since yesterday.   Objective:  Vital signs in last 24 hours: Vitals:   08/06/21 1959 08/06/21 2029 08/07/21 0500 08/07/21 0809  BP: 130/85   134/74  Pulse: 83   74  Resp:      Temp:    97.7 F (36.5 C)  TempSrc:    Oral  SpO2: 90%   97%  Weight:  268 lb 15.4 oz (122 kg) 268 lb 15.4 oz (122 kg)   Height:  5\' 10"  (1.778 m)     Supplemental O2: Room Air SpO2: 97 %   Physical Exam:  Physical Exam Constitutional:      General: He is not in acute distress. HENT:     Head: Normocephalic.  Eyes:     General:        Right eye: No discharge.        Left eye: No discharge.     Conjunctiva/sclera: Conjunctivae normal.  Cardiovascular:     Rate and Rhythm: Normal rate and regular rhythm.  Pulmonary:     Effort: Pulmonary effort is normal. No respiratory distress.     Breath sounds: Normal breath sounds. No wheezing.  Abdominal:     Comments: Bowel sound present.  Abdomen less distended and tensed compared to yesterday.  Musculoskeletal:     Cervical back: Normal range of motion.     Comments: Similar-appearing left lateral foot wound compared to yesterday.  Similar surrounding erythema.  No obvious drainage observed.  Skin:    General: Skin is warm.  Neurological:     Mental Status: He is alert and oriented to person, place, and time.  Psychiatric:        Mood and Affect: Mood normal.        Behavior: Behavior normal.     Filed Weights   08/06/21 2029 08/07/21 0500  Weight: 268 lb 15.4 oz (122 kg) 268 lb 15.4 oz (122 kg)     Intake/Output Summary (Last 24 hours) at 08/07/2021 1116 Last data filed at 08/07/2021 0945 Gross per 24 hour  Intake 1090.13 ml  Output 200 ml  Net 890.13 ml   Net IO Since Admission: 890.13 mL [08/07/21 1116]  Pertinent Labs:    Latest Ref Rng & Units  08/07/2021    5:44 AM 08/06/2021   12:59 PM 04/27/2020    2:11 AM  CBC  WBC 4.0 - 10.5 K/uL 6.3  8.2  5.3   Hemoglobin 13.0 - 17.0 g/dL 04/29/2020  92.9  24.4   Hematocrit 39.0 - 52.0 % 40.5  46.5  40.2   Platelets 150 - 400 K/uL 224  246  211        Latest Ref Rng & Units 08/07/2021    5:44 AM 08/06/2021   12:59 PM 04/27/2020    2:11 AM  CMP  Glucose 70 - 99 mg/dL 04/29/2020  638  177   BUN 6 - 20 mg/dL 7  <5  <5   Creatinine 0.61 - 1.24 mg/dL 116  5.79  0.38   Sodium 135 - 145 mmol/L 136  141  140   Potassium 3.5 - 5.1 mmol/L 3.7  4.0  3.4   Chloride 98 - 111 mmol/L 103  103  108   CO2 22 - 32 mmol/L 26  25  26    Calcium 8.9 -  10.3 mg/dL 8.8  9.4  8.8   Total Protein 6.5 - 8.1 g/dL 6.6  8.1    Total Bilirubin 0.3 - 1.2 mg/dL 0.8  0.5    Alkaline Phos 38 - 126 U/L 82  75    AST 15 - 41 U/L 24  29    ALT 0 - 44 U/L 30  37      Imaging: CT ABDOMEN PELVIS W CONTRAST  Result Date: 08/06/2021 CLINICAL DATA:  Flank pain, kidney stone suspected Abdominal pain, acute, nonlocalized EXAM: CT ABDOMEN AND PELVIS WITH CONTRAST TECHNIQUE: Multidetector CT imaging of the abdomen and pelvis was performed using the standard protocol following bolus administration of intravenous contrast. RADIATION DOSE REDUCTION: This exam was performed according to the departmental dose-optimization program which includes automated exposure control, adjustment of the mA and/or kV according to patient size and/or use of iterative reconstruction technique. CONTRAST:  OMNIPAQUE IOHEXOL 300 MG/ML  SOLN COMPARISON:  10/04/2004 FINDINGS: Lower chest: Linear scarring or atelectasis in the lung bases. Hepatobiliary: Diffuse low-density throughout the liver compatible with fatty infiltration. No focal abnormality. Gallbladder unremarkable. Pancreas: No focal abnormality or ductal dilatation. Spleen: No focal abnormality.  Normal size. Adrenals/Urinary Tract: 6 mm stone in the left renal pelvis. No ureteral stones or hydronephrosis  bilaterally. No renal or adrenal mass. Urinary bladder unremarkable. Stomach/Bowel: Normal appendix. Stomach, large and small bowel grossly unremarkable. Vascular/Lymphatic: No evidence of aneurysm or adenopathy. Infrarenal IVC filter in place. Reproductive: No visible focal abnormality. Other: No free fluid or free air. Musculoskeletal: No acute bony abnormality. IMPRESSION: Left nephrolithiasis.  No ureteral stones or hydronephrosis. Hepatic steatosis. No acute findings in the abdomen or pelvis. Electronically Signed   By: Charlett Nose M.D.   On: 08/06/2021 17:29   DG Foot Complete Left  Result Date: 08/06/2021 CLINICAL DATA:  Left lateral foot ulcer EXAM: LEFT FOOT - COMPLETE 3+ VIEW COMPARISON:  10/04/2004 09/22/2019 FINDINGS: There is lateral subluxation of the fifth digit at the metatarsophalangeal joint. Erosion of the head of the fifth metatarsal consistent with osteomyelitis. Moderate swelling of the mid and forefoot soft tissues. IMPRESSION: Erosion of the head of the fifth metatarsal is consistent with osteomyelitis. Electronically Signed   By: Acquanetta Belling M.D.   On: 08/06/2021 15:01    Assessment/Plan:   Principal Problem:   Acute osteomyelitis (HCC) Active Problems:   Quadriplegia (HCC)   DM2 (diabetes mellitus, type 2) (HCC)   History of DVT (deep vein thrombosis)   Acute lower UTI   Pressure injury of skin   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)   Patient Summary: [name] is a 56 year old male with spinal cord injury C7 2020, quadriplegic, DVT/PE on Eliquis, type 2 diabetes, who presents to the emergency room for worsening abdominal spasm, nausea left foot wound, admitted for osteomyelitis and UTI.   Left foot osteomyelitis Plan for left fifth ray amputation on Friday with Dr. Lajoyce Corners.  Patient remains hemodynamically stable and afebrile so we will continue holding antibiotic until surgery.  But if patient becomes septic, will start broad-spectrum antibiotic with  vancomycin, ceftriaxone and Flagyl. -Appreciate orthopedic surgery recommendation -Blood culture negative to date -Resuming Eliquis   Possible UTI Hematuria Abdominal spasm His abdominal muscle spasm has improved after 1 dose of IV antibiotic.  Given he is hemodynamically stable, will hold off on treating his UTI after his amputation.  Urine culture grew multiple species so I reordered urine culture.  Again if he becomes septic will start  broad-spectrum antibiotic. -Bladder scan q8h. I/O cath as needed. -Baclofen as needed for spasm   Type 2 diabetes Home regimen includes Janumet, metformin, and glipizide. -Pending A1c -Sliding scale insulin    Spinal cord injury Quadriplegia -Baclofen and gabapentin -Suppository nightly   History of DVT  -Resume Eliquis   Carb modified diet Full code IVF: N/A DVT: Eliquis   Dispo: Anticipated discharge to Home in 5 days pending fifth ray amputation.  Doran Stabler, DO Internal Medicine Residency My pager: [phone]   Please contact the on call pager after 5 pm and on weekends at [phone].

## 2021-08-08 DIAGNOSIS — E1169 Type 2 diabetes mellitus with other specified complication: Secondary | ICD-10-CM | POA: Diagnosis not present

## 2021-08-08 DIAGNOSIS — M86172 Other acute osteomyelitis, left ankle and foot: Secondary | ICD-10-CM | POA: Diagnosis not present

## 2021-08-08 DIAGNOSIS — M8610 Other acute osteomyelitis, unspecified site: Secondary | ICD-10-CM | POA: Diagnosis not present

## 2021-08-08 DIAGNOSIS — G8250 Quadriplegia, unspecified: Secondary | ICD-10-CM

## 2021-08-08 DIAGNOSIS — N3001 Acute cystitis with hematuria: Secondary | ICD-10-CM | POA: Diagnosis not present

## 2021-08-08 LAB — BASIC METABOLIC PANEL
Anion gap: 8 (ref 5–15)
BUN: 9 mg/dL (ref 6–20)
CO2: 26 mmol/L (ref 22–32)
Calcium: 8.8 mg/dL — ABNORMAL LOW (ref 8.9–10.3)
Chloride: 105 mmol/L (ref 98–111)
Creatinine, Ser: 0.79 mg/dL (ref 0.61–1.24)
GFR, Estimated: >60 mL/min (ref >60)
Glucose, Bld: 185 mg/dL — ABNORMAL HIGH (ref 70–99)
Potassium: 3.5 mmol/L (ref 3.5–5.1)
Sodium: 139 mmol/L (ref 135–145)

## 2021-08-08 LAB — GLUCOSE, CAPILLARY
Glucose-Capillary: 173 mg/dL — ABNORMAL HIGH (ref 70–99)
Glucose-Capillary: 144 mg/dL — ABNORMAL HIGH (ref 70–99)
Glucose-Capillary: 165 mg/dL — ABNORMAL HIGH (ref 70–99)
Glucose-Capillary: 195 mg/dL — ABNORMAL HIGH (ref 70–99)
Glucose-Capillary: 148 mg/dL — ABNORMAL HIGH (ref 70–99)
Glucose-Capillary: 199 mg/dL — ABNORMAL HIGH (ref 70–99)

## 2021-08-08 LAB — CBC
HCT: 40.5 % (ref 39.0–52.0)
Hemoglobin: 13.7 g/dL (ref 13.0–17.0)
MCH: 30.2 pg (ref 26.0–34.0)
MCHC: 33.8 g/dL (ref 30.0–36.0)
MCV: 89.2 fL (ref 80.0–100.0)
Platelets: 224 10*3/uL (ref 150–400)
RBC: 4.54 MIL/uL (ref 4.22–5.81)
RDW: 13 % (ref 11.5–15.5)
WBC: 6.1 10*3/uL (ref 4.0–10.5)
nRBC: 0 % (ref 0.0–0.2)

## 2021-08-08 LAB — HEMOGLOBIN A1C
Hgb A1c MFr Bld: 7.3 % — ABNORMAL HIGH (ref 4.8–5.6)
Mean Plasma Glucose: 163 mg/dL

## 2021-08-08 NOTE — Plan of Care (Signed)

## 2021-08-08 NOTE — Progress Notes (Signed)
HD#2 Subjective:  Overnight Events: no event  Patient seen and evaluated bedside.  He is resting comfortably.  Denies any pain.  Reports that his muscle spasms and abdomen feel little bit better.  He is moving his bowels but cannot describe whether it has been loose or formed.  Appetite maintained.  No other complaints at this time.   Objective:  Vital signs in last 24 hours: Vitals:   08/07/21 0809 08/07/21 1916 08/08/21 0338 08/08/21 0753  BP: 134/74 135/80 (!) 163/92 124/80  Pulse: 74 79 62 70  Resp:  16 12 16   Temp: 97.7 F (36.5 C) 98 F (36.7 C) 98 F (36.7 C) 97.7 F (36.5 C)  TempSrc: Oral   Oral  SpO2: 97% 96% 97% 94%  Weight:      Height:       Supplemental O2: Room Air SpO2: 94 %   Physical Exam:  Constitutional:      General: He is not in acute distress. HENT:     Head: Normocephalic.  Eyes:     General:        Right eye: No discharge.        Left eye: No discharge.     Conjunctiva/sclera: Conjunctivae normal.  Cardiovascular:     Rate and Rhythm: Normal rate and regular rhythm.  Pulmonary:     Effort: Pulmonary effort is normal. No respiratory distress.     Breath sounds: Normal breath sounds. No wheezing.  Abdominal:     Comments: Bowel sound present.  Abdomen less distended and tensed compared to yesterday.  Musculoskeletal:     Cervical back: Normal range of motion.     Comments: Similar-appearing left lateral foot wound compared to yesterday.  No purulent drainage.  Surrounding erythema improved compared to previously. Skin:    General: Skin is warm.  Neurological:     Mental Status: He is alert and oriented to person, place, and time. He is able to move his upper extremities freely.  No sensation in the lower extremities, and unable to move lower extremities. Psychiatric:        Mood and Affect: Mood normal.        Behavior: Behavior normal.   Filed Weights   08/06/21 2029 08/07/21 0500  Weight: 122 kg 122 kg     Intake/Output  Summary (Last 24 hours) at 08/08/2021 1312 Last data filed at 08/08/2021 1111 Gross per 24 hour  Intake 300 ml  Output 1400 ml  Net -1100 ml   Net IO Since Admission: -359.87 mL [08/08/21 1312]  Pertinent Labs:    Latest Ref Rng & Units 08/08/2021    4:17 AM 08/07/2021    5:44 AM 08/06/2021   12:59 PM  CBC  WBC 4.0 - 10.5 K/uL 6.1  6.3  8.2   Hemoglobin 13.0 - 17.0 g/dL 08/08/2021  84.6  65.9   Hematocrit 39.0 - 52.0 % 40.5  40.5  46.5   Platelets 150 - 400 K/uL 224  224  246        Latest Ref Rng & Units 08/08/2021    4:17 AM 08/07/2021    5:44 AM 08/06/2021   12:59 PM  CMP  Glucose 70 - 99 mg/dL 08/08/2021  701  779   BUN 6 - 20 mg/dL 9  7  <5   Creatinine 390 - 1.24 mg/dL 3.00  9.23  3.00   Sodium 135 - 145 mmol/L 139  136  141   Potassium 3.5 -  5.1 mmol/L 3.5  3.7  4.0   Chloride 98 - 111 mmol/L 105  103  103   CO2 22 - 32 mmol/L 26  26  25    Calcium 8.9 - 10.3 mg/dL 8.8  8.8  9.4   Total Protein 6.5 - 8.1 g/dL  6.6  8.1   Total Bilirubin 0.3 - 1.2 mg/dL  0.8  0.5   Alkaline Phos 38 - 126 U/L  82  75   AST 15 - 41 U/L  24  29   ALT 0 - 44 U/L  30  37     Imaging: No results found.  Assessment/Plan:   Principal Problem:   Acute osteomyelitis (HCC) Active Problems:   Quadriplegia (HCC)   DM2 (diabetes mellitus, type 2) (HCC)   History of DVT (deep vein thrombosis)   Acute lower UTI   Pressure injury of skin   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)   Acute cystitis with hematuria   Patient Summary: [name] is a 56 year old male with spinal cord injury C7 2020, quadriplegic, DVT/PE on Eliquis, type 2 diabetes, who presents to the emergency room for worsening abdominal spasm, nausea left foot wound, admitted for osteomyelitis and UTI.   Left foot osteomyelitis Plan for left fifth ray amputation on Friday with Dr. Tuesday.  Patient remains hemodynamically stable and afebrile so we will continue holding antibiotic until surgery.  But if patient becomes septic, will  start broad-spectrum antibiotic with vancomycin, ceftriaxone and Flagyl. -Appreciate orthopedic surgery recommendation -Blood culture negative to date -Eliquis has been resumed at 5 mg twice daily.  This will need to be held prior to surgery on Friday.   Possible UTI Hematuria Abdominal spasm His abdominal muscle spasm has improved after 1 dose of IV antibiotic.  Given he is hemodynamically stable, will hold off on treating his UTI after his amputation.  Repeat urine culture growing gram-negative rods.  Again if he becomes septic will start broad-spectrum antibiotic. -Bladder scan q8h. I/O cath as needed. -Baclofen as needed for spasm   Type 2 diabetes Home regimen includes Janumet, metformin, and glipizide. -A1c 7.3.  CBGs have shown glucose high 100s. -Sliding scale insulin    Spinal cord injury Quadriplegia -Baclofen and gabapentin -Suppository nightly   History of DVT  -Resume Eliquis, this will need to be held prior to surgery.   Carb modified diet Full code IVF: N/A DVT: Eliquis   Dispo: Anticipated discharge to Home in 5 days pending fifth ray amputation.  Monday, MD Internal Medicine Residency My pager: [phone]   Please contact the on call pager after 5 pm and on weekends at [phone].

## 2021-08-09 DIAGNOSIS — E1169 Type 2 diabetes mellitus with other specified complication: Secondary | ICD-10-CM | POA: Diagnosis not present

## 2021-08-09 DIAGNOSIS — N390 Urinary tract infection, site not specified: Secondary | ICD-10-CM

## 2021-08-09 DIAGNOSIS — M8610 Other acute osteomyelitis, unspecified site: Secondary | ICD-10-CM | POA: Diagnosis not present

## 2021-08-09 DIAGNOSIS — G8250 Quadriplegia, unspecified: Secondary | ICD-10-CM | POA: Diagnosis not present

## 2021-08-09 DIAGNOSIS — B9629 Other Escherichia coli [E. coli] as the cause of diseases classified elsewhere: Secondary | ICD-10-CM

## 2021-08-09 LAB — CBC
HCT: 40.3 % (ref 39.0–52.0)
Hemoglobin: 13.6 g/dL (ref 13.0–17.0)
MCH: 29.9 pg (ref 26.0–34.0)
MCHC: 33.7 g/dL (ref 30.0–36.0)
MCV: 88.6 fL (ref 80.0–100.0)
Platelets: 212 10*3/uL (ref 150–400)
RBC: 4.55 MIL/uL (ref 4.22–5.81)
RDW: 12.9 % (ref 11.5–15.5)
WBC: 6.3 10*3/uL (ref 4.0–10.5)
nRBC: 0 % (ref 0.0–0.2)

## 2021-08-09 LAB — URINE CULTURE: Culture: >=100000 — AB

## 2021-08-09 LAB — BASIC METABOLIC PANEL
Anion gap: 8 (ref 5–15)
BUN: 8 mg/dL (ref 6–20)
CO2: 26 mmol/L (ref 22–32)
Calcium: 8.9 mg/dL (ref 8.9–10.3)
Chloride: 102 mmol/L (ref 98–111)
Creatinine, Ser: 0.47 mg/dL — ABNORMAL LOW (ref 0.61–1.24)
GFR, Estimated: >60 mL/min (ref >60)
Glucose, Bld: 164 mg/dL — ABNORMAL HIGH (ref 70–99)
Potassium: 3.6 mmol/L (ref 3.5–5.1)
Sodium: 136 mmol/L (ref 135–145)

## 2021-08-09 LAB — GLUCOSE, CAPILLARY
Glucose-Capillary: 174 mg/dL — ABNORMAL HIGH (ref 70–99)
Glucose-Capillary: 210 mg/dL — ABNORMAL HIGH (ref 70–99)
Glucose-Capillary: 156 mg/dL — ABNORMAL HIGH (ref 70–99)
Glucose-Capillary: 144 mg/dL — ABNORMAL HIGH (ref 70–99)

## 2021-08-09 MED ORDER — CEFAZOLIN IN SODIUM CHLORIDE 3-0.9 GM/100ML-% IV SOLN
3.0000 g | INTRAVENOUS | Status: AC
  Administered 2021-08-10: 3 g via INTRAVENOUS
  Filled 2021-08-09 (×2): qty 100

## 2021-08-09 MED ORDER — FOSFOMYCIN TROMETHAMINE 3 G PO PACK
3.0000 g | PACK | Freq: Once | ORAL | Status: AC
  Administered 2021-08-09: 3 g via ORAL
  Filled 2021-08-09: qty 3

## 2021-08-09 MED ORDER — POVIDONE-IODINE 10 % EX SWAB
2.0000 | Freq: Once | CUTANEOUS | Status: AC
  Administered 2021-08-10: 2 via TOPICAL

## 2021-08-09 MED ORDER — CHLORHEXIDINE GLUCONATE 4 % EX LIQD
60.0000 mL | Freq: Once | CUTANEOUS | Status: AC
  Administered 2021-08-10: 4 via TOPICAL
  Filled 2021-08-09: qty 60

## 2021-08-09 NOTE — Anesthesia Preprocedure Evaluation (Addendum)
Anesthesia Evaluation  Patient identified by MRN, date of birth, ID band Patient awake    Reviewed: Allergy & Precautions, NPO status , Patient's Chart, lab work & pertinent test results  Airway Mallampati: IV  TM Distance: >3 FB Neck ROM: Limited    Dental  (+) Dental Advisory Given   Pulmonary pneumonia,    Pulmonary exam normal breath sounds clear to auscultation       Cardiovascular negative cardio ROS Normal cardiovascular exam Rhythm:Regular Rate:Normal     Neuro/Psych negative neurological ROS     GI/Hepatic negative GI ROS, Neg liver ROS,   Endo/Other  diabetesMorbid obesity  Renal/GU negative Renal ROS     Musculoskeletal negative musculoskeletal ROS (+)   Abdominal   Peds  Hematology negative hematology ROS (+)   Anesthesia Other Findings   Reproductive/Obstetrics                            Anesthesia Physical Anesthesia Plan  ASA: 3  Anesthesia Plan: MAC   Post-op Pain Management: Tylenol PO (pre-op)* and Minimal or no pain anticipated   Induction: Intravenous  PONV Risk Score and Plan: 1 and Ondansetron, Treatment may vary due to age or medical condition and Midazolam  Airway Management Planned: Natural Airway  Additional Equipment:   Intra-op Plan:   Post-operative Plan:   Informed Consent: I have reviewed the patients History and Physical, chart, labs and discussed the procedure including the risks, benefits and alternatives for the proposed anesthesia with the patient or authorized representative who has indicated his/her understanding and acceptance.     Dental advisory given  Plan Discussed with: CRNA  Anesthesia Plan Comments:        Anesthesia Quick Evaluation

## 2021-08-09 NOTE — Progress Notes (Signed)
HD#3 Subjective:  Overnight Events: no event  Patient seen and evaluated bedside.  Does not complain of pain, reports that he is had not all right appetite.  No fever or chills.   Objective:  Vital signs in last 24 hours: Vitals:   08/08/21 0753 08/08/21 1955 08/09/21 0500 08/09/21 0805  BP: 124/80 135/81  102/66  Pulse: 70 72  71  Resp: 16   16  Temp: 97.7 F (36.5 C) 98.3 F (36.8 C)  98.2 F (36.8 C)  TempSrc: Oral Oral  Oral  SpO2: 94% 93%  95%  Weight:   124 kg   Height:       Supplemental O2: Room Air SpO2: 95 %   Physical Exam:  Constitutional:      General: He is not in acute distress. HENT:     Head: Normocephalic.  Eyes:     General:        Right eye: No discharge.        Left eye: No discharge.     Conjunctiva/sclera: Conjunctivae normal.  Cardiovascular:     Rate and Rhythm: Normal rate and regular rhythm.  Pulmonary:     Effort: Pulmonary effort is normal. No respiratory distress.     Breath sounds: Normal breath sounds. No wheezing.  Abdominal:     Comments: Bowel sound present.  Abdomen less distended and tensed compared to yesterday.  Musculoskeletal:     Cervical back: Normal range of motion.     Comments: Similar-appearing left lateral foot wound compared to yesterday.  No purulent drainage.  Surrounding erythema improved compared to previously. Skin:    General: Skin is warm.  Neurological:     Mental Status: He is alert and oriented to person, place, and time. He is able to move his upper extremities freely.  No sensation in the lower extremities, and unable to move lower extremities. Psychiatric:        Mood and Affect: Mood normal.        Behavior: Behavior normal.   Filed Weights   08/06/21 2029 08/07/21 0500 08/09/21 0500  Weight: 122 kg 122 kg 124 kg     Intake/Output Summary (Last 24 hours) at 08/09/2021 1253 Last data filed at 08/08/2021 2155 Gross per 24 hour  Intake --  Output 1200 ml  Net -1200 ml   Net IO Since  Admission: -1,559.87 mL [08/09/21 1253]  Pertinent Labs:    Latest Ref Rng & Units 08/09/2021    7:50 AM 08/08/2021    4:17 AM 08/07/2021    5:44 AM  CBC  WBC 4.0 - 10.5 K/uL 6.3  6.1  6.3   Hemoglobin 13.0 - 17.0 g/dL 17.5  10.2  58.5   Hematocrit 39.0 - 52.0 % 40.3  40.5  40.5   Platelets 150 - 400 K/uL 212  224  224        Latest Ref Rng & Units 08/09/2021    7:50 AM 08/08/2021    4:17 AM 08/07/2021    5:44 AM  CMP  Glucose 70 - 99 mg/dL 277  824  235   BUN 6 - 20 mg/dL 8  9  7    Creatinine 0.61 - 1.24 mg/dL  3.61  4.43   Sodium 135 - 145 mmol/L 136  139  136   Potassium 3.5 - 5.1 mmol/L 3.6  3.5  3.7   Chloride 98 - 111 mmol/L 102  105  103   CO2 22 - 32  mmol/L 26  26  26    Calcium 8.9 - 10.3 mg/dL 8.9  8.8  8.8   Total Protein 6.5 - 8.1 g/dL   6.6   Total Bilirubin 0.3 - 1.2 mg/dL   0.8   Alkaline Phos 38 - 126 U/L   82   AST 15 - 41 U/L   24   ALT 0 - 44 U/L   30     Imaging: No results found.  Assessment/Plan:   Principal Problem:   Acute osteomyelitis (HCC) Active Problems:   Quadriplegia (HCC)   DM2 (diabetes mellitus, type 2) (HCC)   History of DVT (deep vein thrombosis)   UTI due to extended-spectrum beta lactamase (ESBL) producing Escherichia coli   Pressure injury of skin   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)   Acute cystitis with hematuria   Patient Summary: [name] is a 56 year old male with spinal cord injury C7 2020, quadriplegic, DVT/PE on Eliquis, type 2 diabetes, who presents to the emergency room for worsening abdominal spasm, nausea left foot wound, admitted for osteomyelitis and UTI.   Left foot osteomyelitis Plan for left fifth ray amputation on Friday with Dr. Tuesday.  Patient remains hemodynamically stable and afebrile so we will continue holding antibiotic until surgery.  But if patient becomes septic, will start broad-spectrum antibiotic with vancomycin, ceftriaxone and Flagyl. -Appreciate orthopedic surgery  recommendation -Blood culture negative to date -Request being held today in preparation for surgery tomorrow.  Can resume at 5 mg twice daily after surgery. - Patient may benefit from [facility] consult to assist with wound care at home.    Uncomplicated UTI Hematuria Abdominal spasm His abdominal muscle spasm has improved after 1 dose of IV antibiotic.  Given he is hemodynamically stable, will hold off on treating his UTI after his amputation.   - Repeat urine culture growing E. coli.  After discussing with infectious disease, he will be started on single dose of fosfomycin to treat uncomplicated UTI.  This antibiotic will not interfere with surgical cultures to be collected tomorrow during amputation. -Bladder scan q8h. I/O cath as needed. -Baclofen as needed for spasm   Type 2 diabetes Home regimen includes Janumet, metformin, and glipizide. -A1c 7.3.  CBGs have shown glucose high 100s. -Sliding scale insulin    Spinal cord injury Quadriplegia -Baclofen and gabapentin -Suppository nightly   History of DVT  -Plan to resume Eliquis after surgery.   Carb modified diet Full code IVF: N/A DVT: Eliquis   Dispo: Anticipated discharge to Home in 5 days pending fifth ray amputation.  [facility], MD Internal Medicine Residency My pager: [phone]   Please contact the on call pager after 5 pm and on weekends at [phone].

## 2021-08-09 NOTE — Progress Notes (Addendum)
HD#4 Subjective:  Overnight Events: no event  Patient seen and evaluated bedside.  Denies pain, states he is feeling well.  He is ready for surgery.  Discussed with patient and wife at bedside who states that she is able to help take care of him once he is discharged.  She reports that she helps take care of his wounds and needs to follow with wound care.  Patient describes that his feet are constantly putting pressure on the edges of his feet.  No other complaints at this time   Objective:  Vital signs in last 24 hours: Vitals:   08/10/21 0920 08/10/21 0925 08/10/21 0930 08/10/21 0945  BP: (!) 146/71 118/67 126/72 134/76  Pulse: 67 68 62 63  Resp: 16 17 13  (S) 16  Temp:    97.8 F (36.6 C)  TempSrc:      SpO2: 93% 97% 98% 97%  Weight:      Height:       Supplemental O2: Room Air SpO2: 97 % O2 Flow Rate (L/min): 3 L/min   Physical Exam:  Constitutional:      General: He is not in acute distress. HENT:     Head: Normocephalic.  Cardiovascular:     Rate and Rhythm: Normal rate and regular rhythm.  Pulmonary:     Effort: Pulmonary effort is normal. No respiratory distress.     Breath sounds: Normal breath sounds. No wheezing.  Abdominal:     Comments: Abdomen soft, nontender, positive bowel sounds. Musculoskeletal:     Cervical back: Normal range of motion.     Comments: Similar-appearing left lateral foot wound compared to yesterday.  No purulent drainage.  Minimal surrounding erythema, no change compared to yesterday. Skin:    General: Skin is warm.  Neurological:     Mental Status: He is alert and oriented to person, place, and time. He is able to move his upper extremities freely.  No sensation in the lower extremities, and unable to move lower extremities. Psychiatric:        Mood and Affect: Mood normal.        Behavior: Behavior normal.   Filed Weights   08/07/21 0500 08/09/21 0500 08/10/21 0749  Weight: 122 kg 124 kg 124 kg     Intake/Output Summary  (Last 24 hours) at 08/10/2021 1020 Last data filed at 08/10/2021 0857 Gross per 24 hour  Intake 640 ml  Output 1960 ml  Net -1320 ml   Net IO Since Admission: -2,879.87 mL [08/10/21 1020]  Pertinent Labs:    Latest Ref Rng & Units 08/09/2021    7:50 AM 08/08/2021    4:17 AM 08/07/2021    5:44 AM  CBC  WBC 4.0 - 10.5 K/uL 6.3  6.1  6.3   Hemoglobin 13.0 - 17.0 g/dL 08/09/2021  93.8  18.2   Hematocrit 39.0 - 52.0 % 40.3  40.5  40.5   Platelets 150 - 400 K/uL 212  224  224        Latest Ref Rng & Units 08/09/2021    7:50 AM 08/08/2021    4:17 AM 08/07/2021    5:44 AM  CMP  Glucose 70 - 99 mg/dL 08/09/2021  716  967   BUN 6 - 20 mg/dL 8  9  7    Creatinine 0.61 - 1.24 mg/dL 893   8.10   Sodium 135 - 145 mmol/L 136  139  136   Potassium 3.5 - 5.1 mmol/L 3.6  3.5  3.7   Chloride 98 - 111 mmol/L 102  105  103   CO2 22 - 32 mmol/L 26  26  26    Calcium 8.9 - 10.3 mg/dL 8.9  8.8  8.8   Total Protein 6.5 - 8.1 g/dL   6.6   Total Bilirubin 0.3 - 1.2 mg/dL   0.8   Alkaline Phos 38 - 126 U/L   82   AST 15 - 41 U/L   24   ALT 0 - 44 U/L   30     Imaging: No results found.  Assessment/Plan:   Principal Problem:   Acute osteomyelitis (HCC) Active Problems:   Quadriplegia (HCC)   DM2 (diabetes mellitus, type 2) (HCC)   History of DVT (deep vein thrombosis)   UTI due to extended-spectrum beta lactamase (ESBL) producing Escherichia coli   Pressure injury of skin   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)   Acute cystitis with hematuria   Patient Summary: [name] is a 56 year old male with spinal cord injury C7 2020, quadriplegic, DVT/PE on Eliquis, type 2 diabetes, who presents to the emergency room for worsening abdominal spasm, nausea left foot wound, admitted for osteomyelitis and UTI.   Left foot osteomyelitis Status post fifth ray amputation with Dr. 40 today.  Margins are clear per op note.  It seems like there was no wound culture sent after the amputation.  I reached  out to Dr. Lajoyce Corners to confirm. - Start empiric vancomycin and ceftriaxone.  Can consider transition to p.o.'s if remains hemodynamically stable overnight. - Probably will need orthotic shoes to minimize pressure on the wound -Follow-up with orthopedic in 1 week post operative -We will have PT working with him to assess for any additional need at home.   Complicated UTI Hematuria Abdominal spasm Status post 1 dose of fosfomycin. - Patient has condom cath in place. Bladder scan q8h. I/O cath as needed. -Baclofen as needed for spasm   Type 2 diabetes Home regimen includes Janumet, metformin, and glipizide. -A1c 7.3.  Overall CBG is at goal. -Sliding scale insulin    Spinal cord injury Quadriplegia -Baclofen and gabapentin -Suppository nightly   History of DVT  Resume Eliquis   Carb modified diet Full code IVF: N/A DVT: Eliquis Cardiac monitor: N/A  Dispo: Anticipated discharge to Home in 1 days pending PT and antibiotic regimen  Lajoyce Corners, DO Internal Medicine Residency My pager: [phone]   Please contact the on call pager after 5 pm and on weekends at [phone].

## 2021-08-10 ENCOUNTER — Other Ambulatory Visit: Payer: Self-pay

## 2021-08-10 ENCOUNTER — Inpatient Hospital Stay (HOSPITAL_COMMUNITY): Payer: Medicare Other | Admitting: Anesthesiology

## 2021-08-10 ENCOUNTER — Encounter (HOSPITAL_COMMUNITY): Payer: Self-pay | Admitting: Internal Medicine

## 2021-08-10 ENCOUNTER — Encounter (HOSPITAL_COMMUNITY)
Admission: EM | Disposition: A | Discharge status: Home / Self Care | Payer: Self-pay | Source: Home / Self Care | Attending: Internal Medicine

## 2021-08-10 DIAGNOSIS — M86172 Other acute osteomyelitis, left ankle and foot: Secondary | ICD-10-CM | POA: Diagnosis not present

## 2021-08-10 DIAGNOSIS — B9629 Other Escherichia coli [E. coli] as the cause of diseases classified elsewhere: Secondary | ICD-10-CM | POA: Diagnosis not present

## 2021-08-10 DIAGNOSIS — E119 Type 2 diabetes mellitus without complications: Secondary | ICD-10-CM

## 2021-08-10 DIAGNOSIS — M869 Osteomyelitis, unspecified: Secondary | ICD-10-CM

## 2021-08-10 DIAGNOSIS — G8250 Quadriplegia, unspecified: Secondary | ICD-10-CM | POA: Diagnosis not present

## 2021-08-10 DIAGNOSIS — Z6839 Body mass index (BMI) 39.0-39.9, adult: Secondary | ICD-10-CM | POA: Diagnosis not present

## 2021-08-10 DIAGNOSIS — E1169 Type 2 diabetes mellitus with other specified complication: Secondary | ICD-10-CM | POA: Diagnosis not present

## 2021-08-10 DIAGNOSIS — Z1612 Extended spectrum beta lactamase (ESBL) resistance: Secondary | ICD-10-CM

## 2021-08-10 DIAGNOSIS — N390 Urinary tract infection, site not specified: Secondary | ICD-10-CM | POA: Diagnosis not present

## 2021-08-10 HISTORY — PX: AMPUTATION: SHX166

## 2021-08-10 LAB — CBC
HCT: 39.4 % (ref 39.0–52.0)
Hemoglobin: 13.2 g/dL (ref 13.0–17.0)
MCH: 29.9 pg (ref 26.0–34.0)
MCHC: 33.5 g/dL (ref 30.0–36.0)
MCV: 89.1 fL (ref 80.0–100.0)
Platelets: 199 K/uL (ref 150–400)
RBC: 4.42 MIL/uL (ref 4.22–5.81)
RDW: 12.8 % (ref 11.5–15.5)
WBC: 6.3 K/uL (ref 4.0–10.5)
nRBC: 0 % (ref 0.0–0.2)

## 2021-08-10 LAB — GLUCOSE, CAPILLARY
Glucose-Capillary: 162 mg/dL — ABNORMAL HIGH (ref 70–99)
Glucose-Capillary: 180 mg/dL — ABNORMAL HIGH (ref 70–99)
Glucose-Capillary: 145 mg/dL — ABNORMAL HIGH (ref 70–99)

## 2021-08-10 LAB — BASIC METABOLIC PANEL
Anion gap: 7 (ref 5–15)
BUN: 7 mg/dL (ref 6–20)
CO2: 26 mmol/L (ref 22–32)
Calcium: 8.6 mg/dL — ABNORMAL LOW (ref 8.9–10.3)
Chloride: 105 mmol/L (ref 98–111)
Creatinine, Ser: 0.49 mg/dL — ABNORMAL LOW (ref 0.61–1.24)
GFR, Estimated: >60 mL/min (ref >60)
Glucose, Bld: 156 mg/dL — ABNORMAL HIGH (ref 70–99)
Potassium: 3.5 mmol/L (ref 3.5–5.1)
Sodium: 138 mmol/L (ref 135–145)

## 2021-08-10 SURGERY — AMPUTATION, FOOT, RAY
Anesthesia: Monitor Anesthesia Care | Site: Toe | Laterality: Left

## 2021-08-10 MED ORDER — ORAL CARE MOUTH RINSE: 15.0000 mL | Freq: Once | OROMUCOSAL | Status: AC

## 2021-08-10 MED ORDER — LIDOCAINE HCL (PF) 1 % IJ SOLN
INTRAMUSCULAR | Status: DC | PRN
  Administered 2021-08-10: 20 mL

## 2021-08-10 MED ORDER — PROPOFOL 10 MG/ML IV BOLUS
INTRAVENOUS | Status: DC | PRN
  Administered 2021-08-10 (×5): 20 mg via INTRAVENOUS

## 2021-08-10 MED ORDER — FENTANYL CITRATE (PF) 250 MCG/5ML IJ SOLN
INTRAMUSCULAR | Status: AC
  Filled 2021-08-10: qty 5

## 2021-08-10 MED ORDER — PROMETHAZINE HCL 25 MG/ML IJ SOLN: 6.2500 mg | INTRAMUSCULAR | Status: DC | PRN

## 2021-08-10 MED ORDER — MIDAZOLAM HCL 2 MG/2ML IJ SOLN
INTRAMUSCULAR | Status: AC
  Filled 2021-08-10: qty 2

## 2021-08-10 MED ORDER — OXYCODONE HCL 5 MG PO TABS: 5.0000 mg | ORAL_TABLET | Freq: Once | ORAL | Status: DC | PRN

## 2021-08-10 MED ORDER — 0.9 % SODIUM CHLORIDE (POUR BTL) OPTIME
TOPICAL | Status: DC | PRN
  Administered 2021-08-10: 1000 mL

## 2021-08-10 MED ORDER — PHENYLEPHRINE 80 MCG/ML (10ML) SYRINGE FOR IV PUSH (FOR BLOOD PRESSURE SUPPORT)
PREFILLED_SYRINGE | INTRAVENOUS | Status: DC | PRN
  Administered 2021-08-10 (×2): 80 ug via INTRAVENOUS
  Administered 2021-08-10: 160 ug via INTRAVENOUS

## 2021-08-10 MED ORDER — OXYCODONE HCL 5 MG/5ML PO SOLN: 5.0000 mg | Freq: Once | ORAL | Status: DC | PRN

## 2021-08-10 MED ORDER — MEPERIDINE HCL 25 MG/ML IJ SOLN: 6.2500 mg | INTRAMUSCULAR | Status: DC | PRN

## 2021-08-10 MED ORDER — INSULIN ASPART 100 UNIT/ML IJ SOLN
0.0000 [IU] | INTRAMUSCULAR | Status: DC | PRN
  Administered 2021-08-10: 2 [IU] via SUBCUTANEOUS
  Filled 2021-08-10: qty 1

## 2021-08-10 MED ORDER — APIXABAN 5 MG PO TABS
5.0000 mg | ORAL_TABLET | Freq: Two times a day (BID) | ORAL | Status: DC
  Administered 2021-08-11: 5 mg via ORAL
  Filled 2021-08-10: qty 1

## 2021-08-10 MED ORDER — FENTANYL CITRATE (PF) 100 MCG/2ML IJ SOLN: 25.0000 ug | INTRAMUSCULAR | Status: DC | PRN

## 2021-08-10 MED ORDER — ONDANSETRON HCL 4 MG/2ML IJ SOLN
INTRAMUSCULAR | Status: AC
  Filled 2021-08-10: qty 2

## 2021-08-10 MED ORDER — SODIUM CHLORIDE 0.9 % IV SOLN
2.0000 g | INTRAVENOUS | Status: DC
  Administered 2021-08-10 – 2021-08-11 (×2): 2 g via INTRAVENOUS
  Filled 2021-08-10 (×2): qty 20

## 2021-08-10 MED ORDER — CHLORHEXIDINE GLUCONATE 0.12 % MT SOLN
15.0000 mL | Freq: Once | OROMUCOSAL | Status: AC
  Administered 2021-08-10: 15 mL via OROMUCOSAL
  Filled 2021-08-10: qty 15

## 2021-08-10 MED ORDER — PROPOFOL 10 MG/ML IV BOLUS
INTRAVENOUS | Status: AC
  Filled 2021-08-10: qty 20

## 2021-08-10 MED ORDER — LACTATED RINGERS IV SOLN: INTRAVENOUS | Status: DC

## 2021-08-10 MED ORDER — ONDANSETRON HCL 4 MG/2ML IJ SOLN
INTRAMUSCULAR | Status: DC | PRN
  Administered 2021-08-10: 4 mg via INTRAVENOUS

## 2021-08-10 MED ORDER — LIDOCAINE HCL (PF) 1 % IJ SOLN
INTRAMUSCULAR | Status: AC
  Filled 2021-08-10: qty 30

## 2021-08-10 MED ORDER — MIDAZOLAM HCL 2 MG/2ML IJ SOLN
INTRAMUSCULAR | Status: DC | PRN
  Administered 2021-08-10: 2 mg via INTRAVENOUS

## 2021-08-10 MED ORDER — LIDOCAINE 2% (20 MG/ML) 5 ML SYRINGE
INTRAMUSCULAR | Status: AC
  Filled 2021-08-10: qty 5

## 2021-08-10 MED ORDER — MIDAZOLAM HCL 2 MG/2ML IJ SOLN
INTRAMUSCULAR | Status: AC
Start: 2021-08-10 — End: ?
  Filled 2021-08-10: qty 2

## 2021-08-10 MED ORDER — VANCOMYCIN HCL 10 G IV SOLR
2500.0000 mg | Freq: Two times a day (BID) | INTRAVENOUS | Status: DC
  Administered 2021-08-10 – 2021-08-11 (×2): 2500 mg via INTRAVENOUS
  Filled 2021-08-10: qty 2500
  Filled 2021-08-10: qty 25
  Filled 2021-08-10: qty 2500

## 2021-08-10 SURGICAL SUPPLY — 37 items
BAG COUNTER SPONGE SURGICOUNT (BAG) ×2 IMPLANT
BAG SPNG CNTER NS LX DISP (BAG) ×1
BLADE AVERAGE 25X9 (BLADE) ×1 IMPLANT
BLADE SAW SGTL MED 73X18.5 STR (BLADE) IMPLANT
BLADE SURG 21 STRL SS (BLADE) ×2 IMPLANT
BNDG COHESIVE 1X5 TAN STRL LF (GAUZE/BANDAGES/DRESSINGS) ×1 IMPLANT
BNDG COHESIVE 4X5 TAN STRL (GAUZE/BANDAGES/DRESSINGS) ×2 IMPLANT
BNDG GAUZE DERMACEA FLUFF (GAUZE/BANDAGES/DRESSINGS) ×1
BNDG GAUZE DERMACEA FLUFF 4 (GAUZE/BANDAGES/DRESSINGS) IMPLANT
BNDG GAUZE ELAST 4 BULKY (GAUZE/BANDAGES/DRESSINGS) ×2 IMPLANT
BNDG GZE DERMACEA 4 6PLY (GAUZE/BANDAGES/DRESSINGS) ×1
COVER SURGICAL LIGHT HANDLE (MISCELLANEOUS) ×4 IMPLANT
DRAPE U-SHAPE 47X51 STRL (DRAPES) ×4 IMPLANT
DRSG ADAPTIC 3X8 NADH LF (GAUZE/BANDAGES/DRESSINGS) ×2 IMPLANT
DRSG PAD ABDOMINAL 8X10 ST (GAUZE/BANDAGES/DRESSINGS) ×4 IMPLANT
DURAPREP 26ML APPLICATOR (WOUND CARE) ×2 IMPLANT
ELECT REM PT RETURN 9FT ADLT (ELECTROSURGICAL) ×2
ELECTRODE REM PT RTRN 9FT ADLT (ELECTROSURGICAL) ×1 IMPLANT
GAUZE SPONGE 4X4 12PLY STRL (GAUZE/BANDAGES/DRESSINGS) ×2 IMPLANT
GAUZE SPONGE 4X4 12PLY STRL LF (GAUZE/BANDAGES/DRESSINGS) ×1 IMPLANT
GLOVE BIOGEL PI IND STRL 9 (GLOVE) ×1 IMPLANT
GLOVE BIOGEL PI INDICATOR 9 (GLOVE) ×1
GLOVE SURG ORTHO 9.0 STRL STRW (GLOVE) ×2 IMPLANT
GOWN STRL REUS W/ TWL XL LVL3 (GOWN DISPOSABLE) ×2 IMPLANT
GOWN STRL REUS W/TWL XL LVL3 (GOWN DISPOSABLE) ×4
KIT BASIN OR (CUSTOM PROCEDURE TRAY) ×2 IMPLANT
KIT TURNOVER KIT B (KITS) ×2 IMPLANT
NS IRRIG 1000ML POUR BTL (IV SOLUTION) ×2 IMPLANT
PACK ORTHO EXTREMITY (CUSTOM PROCEDURE TRAY) ×2 IMPLANT
PAD ABD 8X10 STRL (GAUZE/BANDAGES/DRESSINGS) ×1 IMPLANT
PAD ARMBOARD 7.5X6 YLW CONV (MISCELLANEOUS) ×4 IMPLANT
STOCKINETTE IMPERVIOUS LG (DRAPES) IMPLANT
SUT ETHILON 2 0 PSLX (SUTURE) ×3 IMPLANT
SYR CONTROL 10ML LL (SYRINGE) ×1 IMPLANT
TOWEL GREEN STERILE (TOWEL DISPOSABLE) ×2 IMPLANT
TUBE CONNECTING 12X1/4 (SUCTIONS) ×2 IMPLANT
YANKAUER SUCT BULB TIP NO VENT (SUCTIONS) ×2 IMPLANT

## 2021-08-10 NOTE — Progress Notes (Signed)
Pharmacy Antibiotic Note  [name] is a 56 y.o. male admitted on 08/06/2021 with osteomyelitis s/p 5th ray amputation. Patient to resume antibiotics  Pharmacy has been consulted for vancomycin.  dosing.  Plan: Vancomycin 2500mg  IV q12h eAUC 467, Scr used 0.8 Ceftriaxone 2gm IV q24h per MD Monitor renal function, clinical course, and deescalation Levels as needed.   Height: 5\' 10"  (177.8 cm) Weight: 124 kg (273 lb 5.9 oz) IBW/kg (Calculated) : 73  Temp (24hrs), Avg:97.9 F (36.6 C), Min:97.5 F (36.4 C), Max:98.6 F (37 C)  Recent Labs  Lab 08/06/21 1259 08/07/21 0544 08/08/21 0417 08/09/21 0750 08/10/21 1013  WBC 8.2 6.3 6.1 6.3 6.3  CREATININE 0.54* 0.58* 0.79 0.47* 0.49*    Estimated Creatinine Clearance: 141.1 mL/min (A) (by C-G formula based on SCr of 0.49 mg/dL (L)).    No Known Allergies  Antimicrobials this admission: CTX 6/26 x 1, 6/30>> Vancomycin 6/26 >> 6/27, 6/30>>  Dose adjustments this admission:   Microbiology results: 6/26 BCx: NGTD 6/27 UCx: E.coli ESBL  6/26 MRSA PCR: Positive  [name] A. 7/27, PharmD, BCPS, FNKF Clinical Pharmacist [place] Please utilize Amion for appropriate phone number to reach the unit pharmacist [name])  08/10/2021 12:14 PM

## 2021-08-10 NOTE — Transfer of Care (Signed)
Immediate Anesthesia Transfer of Care Note  Patient: Jlen Wintle  Procedure(s) Performed: LEFT 5TH RAY AMPUTATION (Left: Toe)  Patient Location: PACU  Anesthesia Type:MAC  Level of Consciousness: awake and alert   Airway & Oxygen Therapy: Patient Spontanous Breathing and Patient connected to face mask oxygen  Post-op Assessment: Report given to RN and Post -op Vital signs reviewed and stable  Post vital signs: Reviewed and stable  Last Vitals:  Vitals Value Taken Time  BP 172/91 08/10/21 0909  Temp    Pulse 51 08/10/21 0911  Resp 14 08/10/21 0911  SpO2 99 % 08/10/21 0911  Vitals shown include unvalidated device data.  Last Pain:  Vitals:   08/10/21 0827  TempSrc:   PainSc: 0-No pain      Patients Stated Pain Goal: 0 (00/97/94 9971)  Complications: No notable events documented.

## 2021-08-10 NOTE — Op Note (Signed)
08/06/2021 - 08/10/2021  9:02 AM  PATIENT:  [name]    PRE-OPERATIVE DIAGNOSIS:  Osteomyelitis Left 5th Metatarsal Head  POST-OPERATIVE DIAGNOSIS:  Same  PROCEDURE:  LEFT 5TH RAY AMPUTATION Local tissue rearrangement for wound closure 10 x 4 cm.  SURGEON:  Newt Minion, MD  PHYSICIAN ASSISTANT:None ANESTHESIA:   General  PREOPERATIVE INDICATIONS:  [name] is a  56 y.o. male with a diagnosis of Osteomyelitis Left 5th Metatarsal Head who failed conservative measures and elected for surgical management.    The risks benefits and alternatives were discussed with the patient preoperatively including but not limited to the risks of infection, bleeding, nerve injury, cardiopulmonary complications, the need for revision surgery, among others, and the patient was willing to proceed.  OPERATIVE IMPLANTS: None  _0 @  OPERATIVE FINDINGS: Good petechial bleeding wound margins are clear  OPERATIVE PROCEDURE: Patient was brought the operating room underwent a MAC anesthetic.  Patient's left lower extremity was then prepped using DuraPrep draped into a sterile field a timeout was called.  Patient underwent local infiltration with 20 cc of 1% lidocaine plain.  After adequate levels anesthesia obtained a racquet incision was made around the little toe left foot as well as the ulcerative tissue.  This left a wound that was 4 x 10 cm.  The fifth metatarsal was resected through the base with an oscillating saw.  Electrocautery was used hemostasis the wound was irrigated with normal saline.  Local tissue rearrangement was used to close the wound that was 10 x 4 cm.  The incisions were extended and tissue undermined.  The wound closed well with 2-0 nylon.  A sterile dressing was applied patient was taken the PACU in stable condition.   DISCHARGE PLANNING:  Antibiotic duration: Continue IV antibiotics  Weightbearing: Touchdown weightbearing on the left  Pain medication: Low-dose  opioid pathway  Dressing care/ Wound VAC: Dry dressing reinforce as needed  Ambulatory devices: Crutches or walker  Discharge to: Anticipate discharge to home  Follow-up: In the office 1 week post operative.

## 2021-08-10 NOTE — Anesthesia Procedure Notes (Signed)
Procedure Name: MAC Date/Time: 08/10/2021 8:30 AM  Performed by: Inda Coke, CRNAPre-anesthesia Checklist: Patient identified, Emergency Drugs available, Suction available, Timeout performed and Patient being monitored Patient Re-evaluated:Patient Re-evaluated prior to induction Oxygen Delivery Method: Simple face mask Induction Type: IV induction Dental Injury: Teeth and Oropharynx as per pre-operative assessment

## 2021-08-10 NOTE — Interval H&P Note (Signed)
History and Physical Interval Note:  08/10/2021 6:36 AM  [name]  has presented today for surgery, with the diagnosis of Osteomyelitis Left 5th Metatarsal Head.  The various methods of treatment have been discussed with the patient and family. After consideration of risks, benefits and other options for treatment, the patient has consented to  Procedure(s): LEFT 5TH RAY AMPUTATION (Left) as a surgical intervention.  The patient's history has been reviewed, patient examined, no change in status, stable for surgery.  I have reviewed the patient's chart and labs.  Questions were answered to the patient's satisfaction.     [name]

## 2021-08-10 NOTE — Progress Notes (Signed)
Pt speaks spanish only, pre-procedure checklist was completed with a spanish interpreter.

## 2021-08-10 NOTE — Discharge Summary (Incomplete)
Name: [name] MRN: [id number] DOB: 1965-08-16 56 y.o. PCP: Medicine, [facility]  Date of Admission: 08/06/2021 12:17 PM Date of Discharge: 08/11/2021 Attending Physician: Dr.  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   apixaban 5 MG Tabs tablet Commonly known as: Eliquis Take 1 tablet (5 mg total) by mouth 2 (two) times daily. Please make sure pt has THREE refills- wrote this 3 months ago and bottles says no refills   ARTIFICIAL TEARS OP Apply 1-2 drops to eye daily as needed (dry eyes).   baclofen 10 MG tablet Commonly known as: LIORESAL TAKE 1 TABLET BY MOUTH 3 TIMES DAILY as needed for muscle spasm/tightness   BISACODYL LAXATIVE RE Place 1 suppository rectally at bedtime.   blood glucose meter kit and supplies Dispense based on patient and insurance preference. Use up to four times daily as directed. (FOR ICD-10 E10.9, E11.9).   FLUoxetine 20 MG capsule Commonly known as: PROZAC Has 3 refills!!!!! TAKE 1 CAPSULE BY MOUTH DAILY Needs appt for refills   furosemide 40 MG tablet Commonly known as: LASIX Take 1 tablet daily for 6 days, take as needed after that for swelling or weight gain   gabapentin 600 MG tablet Commonly known as: NEURONTIN TAKE 1 TABLET BY MOUTH 3 TIMES DAILY   glipiZIDE 10 MG tablet Commonly known as: GLUCOTROL Take 10 mg by  mouth 2 (two) times daily.   HYDROcodone-acetaminophen 5-325 MG tablet Commonly known as: Norco Take 1 tablet by mouth every 6 (six) hours as needed for moderate pain.   hydrOXYzine 25 MG tablet Commonly known as: ATARAX Take 1 tablet (25 mg total) by mouth 3 (three) times daily as needed for itching.   Janumet 50-1000 MG tablet Generic drug: sitaGLIPtin-metformin Take 1 tablet by mouth 2 (two) times daily with a meal.   Januvia 100 MG tablet Generic drug: sitaGLIPtin Take 100 mg by mouth daily.   metFORMIN 1000 MG tablet Commonly known as: GLUCOPHAGE SMARTSIG:1 Tablet(s) By Mouth Morning-Evening   sildenafil 100 MG tablet Commonly known as: Viagra Take 1 tablet (100 mg total) by mouth daily as needed for erectile dysfunction.               Discharge Care Instructions  (From admission, onward)           Start     Ordered   08/11/21 0000  Non weight bearing       Question Answer Comment  Laterality left   Extremity Lower      08/11/21 1126   08/11/21 0000  Leave dressing on - Keep it clean, dry, and intact until clinic visit        08/11/21 1345            Disposition and follow-up:   [name] was discharged from [facility]  in Stable condition.  At the hospital follow up visit please address:  1.  Follow-up:  a.  Osteomyelitis s/p 5th ray amputation - Clear margins. Patient reporting pressure on feet.  F/u with Dr. Sharol Given in 1 week. He may benefit from orthotics to relieve pressure.  Wounds will need to be properly cared for on discharge.     b.  ESBL E coli UTI- S/p treatment with 1 dose of fosfomycin. Ensure no new symptoms at PCP follow-up.    2.  Labs / imaging needed at time of follow-up: CBC, BMP  3.  Pending labs/ test needing follow-up: None   Follow-up Appointments:  Follow-up Information     Newt Minion, MD Follow up in 1 week(s).   Specialty: Orthopedic Surgery Contact information: [place] [phone] [address] by problem list:  Osteomyelitis-patient presented with wound on his left foot.  Imaging showed osteomyelitis.  Orthopedic surgery was consulted and performed amputation on 6/30.  Given his hemodynamic stability initially antibiotics were held until patient could undergo surgery to maximize intraoperative culture yield.  Surgery was performed and patient was started on 1 day of antibiotics prior to being deemed stable for discharge.  E. coli urinary tract infection -patient reported abdominal pain and muscle spasms on admission.  CT in the emergency department did not reveal any obstruction.  Urinary cultures were collected and returned positive for ESBL E. coli.  Case discussed with infectious disease pharmacist who recommended patient receive one-time dose of fosfomycin to treat UTI without impacting osteomyelitis cultures.  Discharge Subjective: He is doing well today. No pain at this time. Orthopedics also assessed patient at bedside. Ready to go home.  Discharge Exam:   BP 115/77 (BP Location: Right Arm)   Pulse 68   Temp 98.5 F (36.9 C) (Oral)   Resp 18   Ht '5\' 10"'  (1.778 m)   Wt 121.6 kg   SpO2 95%   BMI 38.47 kg/m  Constitutional: well-appearing middle aged male lying in bed, in no acute distress HENT: normocephalic atraumatic, mucous membranes moist Cardiovascular: regular rate and rhythm, no m/r/g Pulmonary/Chest: normal work of breathing on room air, lungs clear to auscultation bilaterally Abdominal: soft, non-tender, non-distended MSK: Wound VAC in place c/d/I on LLE, no drainage noted. Neurological: alert & oriented x 3, quadriplegic. Skin: warm and dry. Wound in sacral area with mild serosanguinous drainage. Psych: normal mood and affect  Pertinent Labs, Studies, and Procedures:     Latest Ref Rng & Units 08/11/2021    2:44 AM 08/10/2021   10:13 AM 08/09/2021    7:50 AM  CBC  WBC 4.0 - 10.5  K/uL 6.7  6.3  6.3   Hemoglobin 13.0 - 17.0 g/dL 13.1  13.2  13.6   Hematocrit 39.0 - 52.0 % 38.8  39.4  40.3   Platelets 150 - 400 K/uL 192  199  212        Latest Ref Rng & Units 08/11/2021    2:44 AM 08/10/2021   10:13 AM 08/09/2021    7:50 AM  CMP  Glucose 70 - 99 mg/dL 147  156  164   BUN 6 - 20 mg/dL '8  7  8   ' Creatinine 0.61 - 1.24 mg/dL 0.71  0.49  0.47   Sodium 135 - 145 mmol/L 137  138  136   Potassium 3.5 - 5.1  mmol/L 3.8  3.5  3.6   Chloride 98 - 111 mmol/L 105  105  102   CO2 22 - 32 mmol/L '25  26  26   ' Calcium 8.9 - 10.3 mg/dL 8.8  8.6  8.9     CT ABDOMEN PELVIS W CONTRAST  Result Date: 08/06/2021 CLINICAL DATA:  Flank pain, kidney stone suspected Abdominal pain, acute, nonlocalized EXAM: CT ABDOMEN AND PELVIS WITH CONTRAST TECHNIQUE: Multidetector CT imaging of the abdomen and pelvis was performed using the standard protocol following bolus administration of intravenous contrast. RADIATION DOSE REDUCTION: This exam was performed according to the departmental dose-optimization program which includes automated exposure control, adjustment of the mA and/or kV according to patient size and/or use of iterative reconstruction technique. CONTRAST:  172m OMNIPAQUE IOHEXOL 300 MG/ML  SOLN COMPARISON:  10/04/2004 FINDINGS: Lower chest: Linear scarring or atelectasis in the lung bases. Hepatobiliary: Diffuse low-density throughout the liver compatible with fatty infiltration. No focal abnormality. Gallbladder unremarkable. Pancreas: No focal abnormality or ductal dilatation. Spleen: No focal abnormality.  Normal size. Adrenals/Urinary Tract: 6 mm stone in the left renal pelvis. No ureteral stones or hydronephrosis bilaterally. No renal or adrenal mass. Urinary bladder unremarkable. Stomach/Bowel: Normal appendix. Stomach, large and small bowel grossly unremarkable. Vascular/Lymphatic: No evidence of aneurysm or adenopathy. Infrarenal IVC filter in place. Reproductive: No visible focal  abnormality. Other: No free fluid or free air. Musculoskeletal: No acute bony abnormality. IMPRESSION: Left nephrolithiasis.  No ureteral stones or hydronephrosis. Hepatic steatosis. No acute findings in the abdomen or pelvis. Electronically Signed   By: KRolm BaptiseM.D.   On: 08/06/2021 17:29   DG Foot Complete Left  Result Date: 08/06/2021 CLINICAL DATA:  Left lateral foot ulcer EXAM: LEFT FOOT - COMPLETE 3+ VIEW COMPARISON:  10/04/2004 09/22/2019 FINDINGS: There is lateral subluxation of the fifth digit at the metatarsophalangeal joint. Erosion of the head of the fifth metatarsal consistent with osteomyelitis. Moderate swelling of the mid and forefoot soft tissues. IMPRESSION: Erosion of the head of the fifth metatarsal is consistent with osteomyelitis. Electronically Signed   By: FMiachel RouxM.D.   On: 08/06/2021 15:01     Discharge Instructions: Discharge Instructions     AMB referral to wound care center   Complete by: As directed    Pressure wound on sacrum. Does not appear infected at this time. Please evaluate.   Diet - low sodium heart healthy   Complete by: As directed    Discharge instructions   Complete by: As directed    Sr. Godby, usted lleg con una infeccin en el pie que necesitaba ciruga para el tTesuque   1. Asegrese de ir a su cita con el mdico ortopdico en 1 semana para que puedan asegurarse de que su pie est sanando bien. Por favor, pregunte acerca de zapatos especiales para ayudar con la presin sobre su pie en ese momento. 2. Usted tuvo una infeccin del tracto urinario que fue tratada con un antibitico aqu. Usted no necesita ms antibiticos en este momento. 3. Asegrese de programar una visita de seguimiento con su mdico de atencin primaria en 1-2 semanas.   Increase activity slowly   Complete by: As directed    Leave dressing on - Keep it clean, dry, and intact until clinic visit   Complete by: As directed    Non weight bearing   Complete by: As  directed    Laterality: left   Extremity: Lower       Signed: JVirl Axe MD  08/11/2021, 1:47 PM   Pager: [phone]

## 2021-08-10 NOTE — Plan of Care (Signed)
  Problem: Coping: Goal: Ability to adjust to condition or change in health will improve Outcome: Progressing   Problem: Metabolic: Goal: Ability to maintain appropriate glucose levels will improve Outcome: Progressing   Problem: Nutritional: Goal: Maintenance of adequate nutrition will improve Outcome: Progressing   Problem: Activity: Goal: Risk for activity intolerance will decrease Outcome: Progressing   Problem: Coping: Goal: Level of anxiety will decrease Outcome: Progressing   Problem: Elimination: Goal: Will not experience complications related to bowel motility Outcome: Progressing   Problem: Pain Managment: Goal: General experience of comfort will improve Outcome: Progressing   Problem: Safety: Goal: Ability to remain free from injury will improve Outcome: Progressing   Problem: Skin Integrity: Goal: Risk for impaired skin integrity will decrease Outcome: Progressing

## 2021-08-10 NOTE — Addendum Note (Signed)
Addendum  created 08/10/21 1152 by Epifanio Lesches, CRNA   Flowsheet accepted

## 2021-08-10 NOTE — Anesthesia Postprocedure Evaluation (Signed)
Anesthesia Post Note  Patient: [name]  Procedure(s) Performed: LEFT 5TH RAY AMPUTATION (Left: Toe)     Patient location during evaluation: PACU Anesthesia Type: MAC Level of consciousness: awake and alert Pain management: pain level controlled Vital Signs Assessment: post-procedure vital signs reviewed and stable Respiratory status: spontaneous breathing Cardiovascular status: stable Anesthetic complications: no   No notable events documented.  Last Vitals:  Vitals:   08/10/21 0945 08/10/21 1052  BP: 134/76 96/60  Pulse: 63 60  Resp: (S) 16 15  Temp: 36.6 C (!) 36.4 C  SpO2: 97% 98%    Last Pain:  Vitals:   08/10/21 1052  TempSrc: Oral  PainSc:                  Nolon Nations

## 2021-08-11 ENCOUNTER — Encounter (HOSPITAL_COMMUNITY): Payer: Self-pay | Admitting: Orthopedic Surgery

## 2021-08-11 DIAGNOSIS — B9629 Other Escherichia coli [E. coli] as the cause of diseases classified elsewhere: Secondary | ICD-10-CM | POA: Diagnosis not present

## 2021-08-11 DIAGNOSIS — N390 Urinary tract infection, site not specified: Secondary | ICD-10-CM | POA: Diagnosis not present

## 2021-08-11 DIAGNOSIS — G8250 Quadriplegia, unspecified: Secondary | ICD-10-CM | POA: Diagnosis not present

## 2021-08-11 DIAGNOSIS — M86172 Other acute osteomyelitis, left ankle and foot: Secondary | ICD-10-CM | POA: Diagnosis not present

## 2021-08-11 LAB — CBC
HCT: 38.8 % — ABNORMAL LOW (ref 39.0–52.0)
Hemoglobin: 13.1 g/dL (ref 13.0–17.0)
MCH: 30.3 pg (ref 26.0–34.0)
MCHC: 33.8 g/dL (ref 30.0–36.0)
MCV: 89.8 fL (ref 80.0–100.0)
Platelets: 192 10*3/uL (ref 150–400)
RBC: 4.32 MIL/uL (ref 4.22–5.81)
RDW: 12.9 % (ref 11.5–15.5)
WBC: 6.7 10*3/uL (ref 4.0–10.5)
nRBC: 0 % (ref 0.0–0.2)

## 2021-08-11 LAB — BASIC METABOLIC PANEL
Anion gap: 7 (ref 5–15)
BUN: 8 mg/dL (ref 6–20)
CO2: 25 mmol/L (ref 22–32)
Calcium: 8.8 mg/dL — ABNORMAL LOW (ref 8.9–10.3)
Chloride: 105 mmol/L (ref 98–111)
Creatinine, Ser: 0.71 mg/dL (ref 0.61–1.24)
GFR, Estimated: >60 mL/min (ref >60)
Glucose, Bld: 147 mg/dL — ABNORMAL HIGH (ref 70–99)
Potassium: 3.8 mmol/L (ref 3.5–5.1)
Sodium: 137 mmol/L (ref 135–145)

## 2021-08-11 LAB — GLUCOSE, CAPILLARY
Glucose-Capillary: 80 mg/dL (ref 70–99)
Glucose-Capillary: 209 mg/dL — ABNORMAL HIGH (ref 70–99)
Glucose-Capillary: 171 mg/dL — ABNORMAL HIGH (ref 70–99)
Glucose-Capillary: 229 mg/dL — ABNORMAL HIGH (ref 70–99)

## 2021-08-11 LAB — CULTURE, BLOOD (ROUTINE X 2)
Culture: NO GROWTH
Culture: NO GROWTH
Special Requests: ADEQUATE
Special Requests: ADEQUATE

## 2021-08-11 MED ORDER — VANCOMYCIN HCL 10 G IV SOLR
2500.0000 mg | INTRAVENOUS | Status: DC
  Filled 2021-08-11: qty 25

## 2021-08-11 MED ORDER — VANCOMYCIN HCL 1500 MG/300ML IV SOLN
1500.0000 mg | Freq: Two times a day (BID) | INTRAVENOUS | Status: DC
  Filled 2021-08-11: qty 300

## 2021-08-11 NOTE — Progress Notes (Signed)
Pharmacy Antibiotic Note  [name] is a 56 y.o. male admitted on 08/06/2021 with osteomyelitis s/p 5th ray amputation. Patient to resume antibiotics  Pharmacy has been consulted for vancomycin dosing.  Plan: Decrease Vancomycin to 1500 mg IV q12h eAUC 514, used Scr 0.8, weight 121.6 kg, Vd coefficient 0.5 Ceftriaxone 2gm IV q24h per MD Monitor renal function, clinical course, and deescalation Levels as needed.   Height: 5\' 10"  (177.8 cm) Weight: 121.6 kg (268 lb 1.3 oz) IBW/kg (Calculated) : 73  Temp (24hrs), Avg:98.2 F (36.8 C), Min:97.5 F (36.4 C), Max:98.8 F (37.1 C)  Recent Labs  Lab 08/07/21 0544 08/08/21 0417 08/09/21 0750 08/10/21 1013 08/11/21 0244  WBC 6.3 6.1 6.3 6.3 6.7  CREATININE 0.58* 0.79 0.47* 0.49* 0.71     Estimated Creatinine Clearance: 139.6 mL/min (by C-G formula based on SCr of 0.71 mg/dL).    No Known Allergies  Antimicrobials this admission: CTX 6/26 x 1, 6/30>> Vancomycin 6/26 >> 6/27, 6/30>>   Microbiology results: 6/26 BCx: NG x 4d 6/27 UCx: E.coli ESBL  6/26 MRSA PCR: Positive   Thank you for allowing 7/26 to participate in this patients care. [place], PharmD 08/11/2021 9:18 AM  **Pharmacist phone directory can be found on [email or web address] listed under [facility]**

## 2021-08-11 NOTE — Evaluation (Signed)
Physical Therapy Evaluation Patient Details Name: Bonham Zingale MRN: [id number] DOB: February 20, 1965 Today's Date: 08/11/2021  History of Present Illness  Amdrew Oboyle is a 56 y.o. male presents with chronic osteomyelitis fifth metatarsal head left foot and underwent L fifth toe amp on 6/30 with Dr. Lajoyce Corners.  PMH: DM II, history of C7 cervical spine fracture and cervical spine surgery with quadriplegia.   Clinical Impression  Pt functioning at baseline as pt is non-ambulatory and requires hoyer lift transfers in/out of w/c/bed. Pt with poor static sitting balance but give valiant effort. Acute PT to follow to promote OOB mobility to prevent deconditioning from hospital stay.        Recommendations for follow up therapy are one component of a multi-disciplinary discharge planning process, led by the attending physician.  Recommendations may be updated based on patient status, additional functional criteria and insurance authorization.  Follow Up Recommendations No PT follow up      Assistance Recommended at Discharge Frequent or constant Supervision/Assistance  Patient can return home with the following  Two people to help with walking and/or transfers;Assistance with cooking/housework;Direct supervision/assist for medications management;Direct supervision/assist for financial management;Assist for transportation;Help with stairs or ramp for entrance    Equipment Recommendations None recommended by PT (has all needed equip)  Recommendations for Other Services       Functional Status Assessment Patient has had a recent decline in their functional status and demonstrates the ability to make significant improvements in function in a reasonable and predictable amount of time.     Precautions / Restrictions Precautions Precautions: Fall Precaution Comments: paraplegia, able to move UEs but not lower extremities Required Braces or Orthoses:  (has bilat PRAFOS on at rest) Restrictions Weight  Bearing Restrictions: Yes LLE Weight Bearing: Non weight bearing      Mobility  Bed Mobility Overal bed mobility: Needs Assistance Bed Mobility: Rolling, Sidelying to Sit, Sit to Sidelying Rolling: Max assist (with use of bed pad) Sidelying to sit: Max assist, HOB elevated     Sit to sidelying: Max assist, +2 for physical assistance General bed mobility comments: with use of bed pad pt able to roll to the R, maxA for trunk elevation to sit EOB, maxA for LE management back to bed, pt able to reach above head and pull on head board to pull self up in bed    Transfers                   General transfer comment: didn't not complete today, pt uses hoyer lift at home    Ambulation/Gait               General Gait Details: pt non-ambulatory  Stairs            Wheelchair Mobility    Modified Rankin (Stroke Patients Only)       Balance Overall balance assessment: Needs assistance Sitting-balance support: Feet supported, Bilateral upper extremity supported Sitting balance-Leahy Scale: Fair Sitting balance - Comments: pt able to maintain balance with bilat UE support with close min guard however unable when only using unilateral UE support or no UE support                                     Pertinent Vitals/Pain Pain Assessment Pain Assessment: No/denies pain    Home Living Family/patient expects to be discharged to:: Private residence Living Arrangements: Spouse/significant other Available Help  at Discharge: Family;Available 24 hours/day Type of Home: House Home Access: Level entry       Home Layout: Able to live on main level with bedroom/bathroom Home Equipment: Wheelchair - power;Shower seat      Prior Function Prior Level of Function : Needs assist             Mobility Comments: family used hoyer lift to transfer pt in/out bed/w/c, pt used electric w/c ADLs Comments: able to assist with UB dressing but not lower body,  able to feed self     Hand Dominance   Dominant Hand: Right    Extremity/Trunk Assessment   Upper Extremity Assessment Upper Extremity Assessment: Overall WFL for tasks assessed    Lower Extremity Assessment Lower Extremity Assessment:  (pt with paraplegia, no active movement)    Cervical / Trunk Assessment Cervical / Trunk Assessment:  (large body habitua)  Communication   Communication: Prefers language other than [place] (used [employer], #[id number] as an interpreter)  Cognition Arousal/Alertness: Awake/alert Behavior During Therapy: WFL for tasks assessed/performed Overall Cognitive Status: Within Functional Limits for tasks assessed                                          General Comments General comments (skin integrity, edema, etc.): bilat LE edema, L foot surgical sight covered in dressing, RN with report of puss coming out of cyst on lower buttocks, MD present and looked at it when PT rolled pt    Exercises     Assessment/Plan    PT Assessment Patient needs continued PT services  PT Problem List Decreased strength;Decreased range of motion;Decreased activity tolerance;Decreased balance;Decreased mobility;Decreased coordination;Decreased cognition       PT Treatment Interventions DME instruction;Gait training;Stair training;Functional mobility training;Therapeutic activities;Therapeutic exercise;Balance training;Neuromuscular re-education    PT Goals (Current goals can be found in the Care Plan section)  Acute Rehab PT Goals Patient Stated Goal: home PT Goal Formulation: With patient Time For Goal Achievement: 08/25/21 Potential to Achieve Goals: Good    Frequency Min 2X/week     Co-evaluation               AM-PAC PT "6 Clicks" Mobility  Outcome Measure Help needed turning from your back to your side while in a flat bed without using bedrails?: Total Help needed moving from lying on your back to sitting on the side of a flat bed  without using bedrails?: Total Help needed moving to and from a bed to a chair (including a wheelchair)?: Total Help needed standing up from a chair using your arms (e.g., wheelchair or bedside chair)?: Total Help needed to walk in hospital room?: Total Help needed climbing 3-5 steps with a railing? : Total 6 Click Score: 6    End of Session Equipment Utilized During Treatment: Oxygen Activity Tolerance: Patient tolerated treatment well Patient left: in bed;with call bell/phone within reach;with bed alarm set Nurse Communication: Mobility status (RN present to return pt to bed) PT Visit Diagnosis: Muscle weakness (generalized) (M62.81)    Time: 9702-6378 PT Time Calculation (min) (ACUTE ONLY): 27 min   Charges:   PT Evaluation $PT Eval Moderate Complexity: 1 Mod PT Treatments $Therapeutic Activity: 8-22 mins        Lewis Shock, PT, DPT Acute Rehabilitation Services Secure chat preferred Office #: [phone]    Iona Hansen 08/11/2021, 1:40 PM

## 2021-08-11 NOTE — Plan of Care (Signed)

## 2021-08-11 NOTE — Progress Notes (Signed)
Education provided to Patient, Spouse and Son via Optician, dispensing. VSS, pt using home electric wheel chair for transition to parking.  Family has wheelchair accessible vehicle for safe transportation home.

## 2021-08-11 NOTE — Progress Notes (Signed)
Patient ID: [name], male   DOB: 03-14-1965, 56 y.o.   MRN: [id number] Patient is postoperative day 1 left foot fifth ray amputation.  The margins were clear.  Patient may discharge to home at this time.  Keep dressing dry. I will follow-up in the office in 1 week.  Ideally nonweightbearing on the left foot.

## 2021-08-16 ENCOUNTER — Ambulatory Visit (INDEPENDENT_AMBULATORY_CARE_PROVIDER_SITE_OTHER): Payer: Medicare Other | Admitting: Orthopedic Surgery

## 2021-08-16 DIAGNOSIS — Z89422 Acquired absence of other left toe(s): Secondary | ICD-10-CM

## 2021-08-30 ENCOUNTER — Ambulatory Visit (INDEPENDENT_AMBULATORY_CARE_PROVIDER_SITE_OTHER): Payer: Medicare Other | Admitting: Orthopedic Surgery

## 2021-08-30 DIAGNOSIS — Z89422 Acquired absence of other left toe(s): Secondary | ICD-10-CM

## 2021-09-04 ENCOUNTER — Encounter: Payer: Self-pay | Admitting: Orthopedic Surgery

## 2021-09-04 NOTE — Progress Notes (Signed)
Office Visit Note   Patient: [name]           Date of Birth: February 24, 1965           MRN: [id number] Visit Date: 08/16/2021              Requested by: Medicine, [facility] No address on file PCP: Medicine, [facility]  Chief Complaint  Patient presents with   Left Foot - Routine Post Op    08/10/2021 left foot 5th ray amputation       HPI: Patient is 1 week status post left foot fifth ray amputation.  Assessment & Plan: Visit Diagnoses:  1. 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  Harvest sutures at follow-up.  Follow-Up Instructions: Return in about 2 weeks (around 08/30/2021).   Ortho Exam  Patient is alert, oriented, no adenopathy, well-dressed, normal affect, normal respiratory effort. Examination patient is ambulating in a motorized wheelchair the incision is well-healed.  Imaging: No results found. No images are attached to the encounter.  Labs: Lab Results  Component Value Date   HGBA1C 7.3 (H) 08/06/2021   HGBA1C 8.2 (H) 04/26/2020   HGBA1C 6.5 (A) 09/06/2019   ESRSEDRATE 19 (H) 08/06/2021   ESRSEDRATE 39 (H) 09/22/2019   CRP 2.0 (H) 08/06/2021   CRP 1.7 (H) 09/22/2019   REPTSTATUS 08/09/2021 FINAL 08/07/2021   CULT (A) 08/07/2021    >=100,000 COLONIES/mL ESCHERICHIA COLI Confirmed Extended Spectrum Beta-Lactamase Producer (ESBL).  In bloodstream infections from ESBL organisms, carbapenems are preferred over piperacillin/tazobactam. They are shown to have a lower risk of mortality.    LABORGA ESCHERICHIA COLI (A) 08/07/2021     Lab Results  Component Value Date   ALBUMIN 3.4 (L) 08/07/2021   ALBUMIN 4.1 08/06/2021   ALBUMIN 3.3 (L) 04/26/2020   PREALBUMIN 12.9 (L) 09/23/2019    Lab Results  Component Value Date   MG 2.0 09/25/2019   No results found for: "VD25OH"  Lab Results  Component Value  Date   PREALBUMIN 12.9 (L) 09/23/2019      Latest Ref Rng & Units 08/11/2021    2:44 AM 08/10/2021   10:13 AM 08/09/2021    7:50 AM  CBC EXTENDED  WBC 4.0 - 10.5 K/uL 6.7  6.3  6.3   RBC 4.22 - 5.81 MIL/uL 4.32  4.42  4.55   Hemoglobin 13.0 - 17.0 g/dL 95.1  88.4  16.6   HCT 39.0 - 52.0 % 38.8  39.4  40.3   Platelets 150 - 400 K/uL 192  199  212      There is no height or weight on file to calculate BMI.  Orders:  No orders of the defined types were placed in this encounter.  No orders of the defined types were placed in this encounter.    Procedures: No procedures performed  Clinical Data: No additional findings.  ROS:  All other systems negative, except as noted in the HPI. Review of Systems  Objective: Vital Signs: There were no vitals taken for this visit.  Specialty Comments:  No specialty comments available.  PMFS History: Patient Active Problem List   Diagnosis Date Noted   Pressure injury of skin 08/07/2021   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)    Acute cystitis with hematuria    Acute osteomyelitis (HCC) 08/06/2021   Chronic pain  due to trauma 01/01/2021   Acute metabolic encephalopathy 04/26/2020   Pneumonia 04/26/2020   DM2 (diabetes mellitus, type 2) (HCC) 04/26/2020   History of DVT (deep vein thrombosis) 04/26/2020   UTI due to extended-spectrum beta lactamase (ESBL) producing Escherichia coli 04/26/2020   Community acquired pneumonia of left lower lobe of lung 04/26/2020   At risk for unstable body temperature 11/26/2019   Wheelchair dependence 11/26/2019   Pressure ulcer 09/22/2019   Cellulitis 09/22/2019   Hypokalemia 09/22/2019   Spasticity 03/08/2019   Erectile dysfunction due to diseases classified elsewhere 03/08/2019   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/22/2019   Neurogenic bowel 01/22/2019   Neurogenic bladder 01/22/2019   Myofascial pain 01/22/2019   Spinal cord injury at T1-T6 level (HCC) 12/30/2018   Quadriplegia (HCC)  12/30/2018   Past Medical History:  Diagnosis Date   Diabetes mellitus without complication (HCC)    History of cervical fracture     Family History  Problem Relation Age of Onset   Diabetes Mother    Diabetes Father     Past Surgical History:  Procedure Laterality Date   AMPUTATION Left 08/10/2021   Procedure: LEFT 5TH RAY AMPUTATION;  Surgeon: [name], MD;  Location: [facility] OR;  Service: Orthopedics;  Laterality: Left;   CERVICAL SPINE SURGERY     Social History   Occupational History   Occupation: Disabled  Tobacco Use   Smoking status: Never   Smokeless tobacco: Never  Vaping Use   Vaping Use: Former  Substance and Sexual Activity   Alcohol use: Yes    Comment: social   Drug use: No   Sexual activity: Not on file

## 2021-09-11 ENCOUNTER — Encounter: Payer: Self-pay | Admitting: Orthopedic Surgery

## 2021-09-11 NOTE — Progress Notes (Signed)
Office Visit Note   Patient: [name]           Date of Birth: April 20, 1965           MRN: [id number] Visit Date: 08/30/2021              Requested by: Medicine, [facility] No address on file PCP: Medicine, [facility]  Chief Complaint  Patient presents with   Left Foot - Routine Post Op    08/10/2021 left 5th ray amputation       HPI: Patient is a 56 year old gentleman who is 3 weeks status post left foot fifth ray amputation.  Assessment & Plan: Visit Diagnoses:  1. 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  Recommended wide new balance sneakers.  Follow-Up Instructions: Return in about 3 months (around 11/30/2021).   Ortho Exam  Patient is alert, oriented, no adenopathy, well-dressed, normal affect, normal respiratory effort. Examination the incision is well-healed sutures are harvested nails were trimmed x9.  Imaging: No results found. No images are attached to the encounter.  Labs: Lab Results  Component Value Date   HGBA1C 7.3 (H) 08/06/2021   HGBA1C 8.2 (H) 04/26/2020   HGBA1C 6.5 (A) 09/06/2019   ESRSEDRATE 19 (H) 08/06/2021   ESRSEDRATE 39 (H) 09/22/2019   CRP 2.0 (H) 08/06/2021   CRP 1.7 (H) 09/22/2019   REPTSTATUS 08/09/2021 FINAL 08/07/2021   CULT (A) 08/07/2021    >=100,000 COLONIES/mL ESCHERICHIA COLI Confirmed Extended Spectrum Beta-Lactamase Producer (ESBL).  In bloodstream infections from ESBL organisms, carbapenems are preferred over piperacillin/tazobactam. They are shown to have a lower risk of mortality.    LABORGA ESCHERICHIA COLI (A) 08/07/2021     Lab Results  Component Value Date   ALBUMIN 3.4 (L) 08/07/2021   ALBUMIN 4.1 08/06/2021   ALBUMIN 3.3 (L) 04/26/2020   PREALBUMIN 12.9 (L) 09/23/2019    Lab Results  Component Value Date   MG 2.0 09/25/2019   No results found for:  "VD25OH"  Lab Results  Component Value Date   PREALBUMIN 12.9 (L) 09/23/2019      Latest Ref Rng & Units 08/11/2021    2:44 AM 08/10/2021   10:13 AM 08/09/2021    7:50 AM  CBC EXTENDED  WBC 4.0 - 10.5 K/uL 6.7  6.3  6.3   RBC 4.22 - 5.81 MIL/uL 4.32  4.42  4.55   Hemoglobin 13.0 - 17.0 g/dL 78.2  95.6  21.3   HCT 39.0 - 52.0 % 38.8  39.4  40.3   Platelets 150 - 400 K/uL 192  199  212      There is no height or weight on file to calculate BMI.  Orders:  No orders of the defined types were placed in this encounter.  No orders of the defined types were placed in this encounter.    Procedures: No procedures performed  Clinical Data: No additional findings.  ROS:  All other systems negative, except as noted in the HPI. Review of Systems  Objective: Vital Signs: There were no vitals taken for this visit.  Specialty Comments:  No specialty comments available.  PMFS History: Patient Active Problem List   Diagnosis Date Noted   Pressure injury of skin 08/07/2021   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)    Acute cystitis with hematuria    Acute osteomyelitis (HCC) 08/06/2021  Chronic pain due to trauma 01/01/2021   Acute metabolic encephalopathy 04/26/2020   Pneumonia 04/26/2020   DM2 (diabetes mellitus, type 2) (HCC) 04/26/2020   History of DVT (deep vein thrombosis) 04/26/2020   UTI due to extended-spectrum beta lactamase (ESBL) producing Escherichia coli 04/26/2020   Community acquired pneumonia of left lower lobe of lung 04/26/2020   At risk for unstable body temperature 11/26/2019   Wheelchair dependence 11/26/2019   Pressure ulcer 09/22/2019   Cellulitis 09/22/2019   Hypokalemia 09/22/2019   Spasticity 03/08/2019   Erectile dysfunction due to diseases classified elsewhere 03/08/2019   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/22/2019   Neurogenic bowel 01/22/2019   Neurogenic bladder 01/22/2019   Myofascial pain 01/22/2019   Spinal cord injury at T1-T6 level  (HCC) 12/30/2018   Quadriplegia (HCC) 12/30/2018   Past Medical History:  Diagnosis Date   Diabetes mellitus without complication (HCC)    History of cervical fracture     Family History  Problem Relation Age of Onset   Diabetes Mother    Diabetes Father     Past Surgical History:  Procedure Laterality Date   AMPUTATION Left 08/10/2021   Procedure: LEFT 5TH RAY AMPUTATION;  Surgeon: [name], MD;  Location: [facility] OR;  Service: Orthopedics;  Laterality: Left;   CERVICAL SPINE SURGERY     Social History   Occupational History   Occupation: Disabled  Tobacco Use   Smoking status: Never   Smokeless tobacco: Never  Vaping Use   Vaping Use: Former  Substance and Sexual Activity   Alcohol use: Yes    Comment: social   Drug use: No   Sexual activity: Not on file

## 2021-11-15 ENCOUNTER — Encounter: Payer: Self-pay | Admitting: *Deleted

## 2021-11-16 ENCOUNTER — Encounter
Payer: Medicare Other | Attending: Physical Medicine and Rehabilitation | Admitting: Physical Medicine and Rehabilitation

## 2021-11-16 ENCOUNTER — Encounter: Payer: Self-pay | Admitting: Physical Medicine and Rehabilitation

## 2021-11-16 VITALS — BP 123/80 | HR 64 | Resp 92 | Ht 71.0 in | Wt 268.1 lb

## 2021-11-16 DIAGNOSIS — R339 Retention of urine, unspecified: Secondary | ICD-10-CM | POA: Insufficient documentation

## 2021-11-16 DIAGNOSIS — M7918 Myalgia, other site: Secondary | ICD-10-CM | POA: Insufficient documentation

## 2021-11-16 DIAGNOSIS — G8250 Quadriplegia, unspecified: Secondary | ICD-10-CM | POA: Diagnosis present

## 2021-11-16 DIAGNOSIS — N319 Neuromuscular dysfunction of bladder, unspecified: Secondary | ICD-10-CM | POA: Insufficient documentation

## 2021-11-16 DIAGNOSIS — R319 Hematuria, unspecified: Secondary | ICD-10-CM | POA: Insufficient documentation

## 2021-11-16 MED ORDER — HYDROCODONE-ACETAMINOPHEN 5-325 MG PO TABS: 1.0000 | ORAL_TABLET | Freq: Two times a day (BID) | ORAL | 0 refills | Status: DC | PRN

## 2021-11-16 MED ORDER — LIDOCAINE HCL 1 % IJ SOLN: 5.0000 mL | Freq: Once | INTRAMUSCULAR | Status: DC

## 2021-11-16 NOTE — Progress Notes (Signed)
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   Here for f/u on SCI and trigger point injections.   Still cathing- q4 hours- 6x/day.  Still using foley as needed for special occasions.  Sometimes having problems with catheters- 2 months haven't sent catheters.  Having bleeding with cathing- usually in last 2 weeks most mornings.  And gets AD autonomic dysreflexia    Last year, pt got Rx for pain- Norco- only takes when has really bad pain, but scared his were expired. Has only taken ~ 15 in last year- will give refill- since only got 30 pills 11/22  Wants trigger point injections again today.  For shoulder pain.    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 So he needs supplies based on this.    2. Can have Norco order for severe pain-   Norco 5/325 mg  for severe pain up to 2x/day as needed #30  3. Con't Gabapentin , baclofen, and Prozac and Viagra- doesn't need refills today  4. Put foley in for 1 week ( at least 3 days)- and then go back to cathing- so it will let things heal. Might need to flush with saline if leaves foley in.    5. Also will to Urology- cystoscopy- for bleeding- Pt is C7 quadriplegia- having bleeding with caths/hematuria- - needs cystoscopy to rule out bladder cancer- usually caths 6x/day for neurogenic bladder   6. Went over colonization of bladder- with bladder UTI vs colonization.  Go to ER IF has signs of being sick-fever, feels  ill, chills, increased spasms, etc.    7. Call me if wheelchair has problems- I will help fix it with [employer].If changes insurance, can change to [employer].    8. Can choose any insurance- BCBS, [employer], any other insurance/medicare is good, except [employer] for w/c coverage. .    9.  F/U in 3 months- trigger point injections- and double appt for SCI.    I spent a total of 37   minutes on total care today- >50% coordination of care- due to 10 minutes on TrP injections; 27 minutes on f/u as detailed above.

## 2021-11-16 NOTE — Patient Instructions (Addendum)
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 So he needs supplies based on this.    2. Can have Norco order for severe pain-   Norco 5/325 mg  for severe pain up to 2x/day as needed #30  3. Con't Gabapentin , baclofen, and Prozac and Viagra- doesn't need refills today  4. Put foley in for 1 week ( at least 3 days)- and then go back to cathing- so it will let things heal. Might need to flush with saline if leaves foley in.    5. Also will to Urology- cystoscopy- for bleeding- Pt is C7 quadriplegia- having bleeding with caths/hematuria- - needs cystoscopy to rule out bladder cancer- usually caths 6x/day for neurogenic bladder   6. Went over colonization of bladder- with bladder UTI vs colonization.  Go to ER IF has signs of being sick-fever, feels ill, chills, increased spasms, etc.    7. Call me if wheelchair has problems- I will help fix it with [employer]. I will help fix it with [employer].If changes insurance, can change to [employer].    8. Can choose any insurance- [employer]BS, [employer], any other insurance/medicare is good, except [employer] for w/c coverage. .    9.  F/U in 3 months- trigger point injections- and double appt for SCI.

## 2021-12-03 ENCOUNTER — Ambulatory Visit (INDEPENDENT_AMBULATORY_CARE_PROVIDER_SITE_OTHER): Payer: Medicare Other | Admitting: Orthopedic Surgery

## 2021-12-03 ENCOUNTER — Encounter: Payer: Self-pay | Admitting: Orthopedic Surgery

## 2021-12-03 DIAGNOSIS — Z89422 Acquired absence of other left toe(s): Secondary | ICD-10-CM

## 2021-12-03 NOTE — Progress Notes (Signed)
Office Visit Note   Patient: [name]           Date of Birth: 1965-11-27           MRN: [id number] Visit Date: 12/03/2021              Requested by: Medicine, [place] [facility] No address on file PCP: Marcial Pacas, MD  Chief Complaint  Patient presents with   Left Foot - Follow-up    08/10/21 left foot 5th ray amputation       HPI: Patient is a 56 year old gentleman who is 4 months status post left foot fifth ray amputation.  Patient has compression stockings new balance sneakers ambulates in a motorized wheelchair.  Assessment & Plan: Visit Diagnoses:  1. 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  Follow-Up Instructions: Return if symptoms worsen or fail to improve.   Ortho Exam  Patient is alert, oriented, no adenopathy, well-dressed, normal affect, normal respiratory effort. Examination incision is well-healed there is no redness no cellulitis no drainage no ulcers no signs of infection.  Imaging: No results found. No images are attached to the encounter.  Labs: Lab Results  Component Value Date   HGBA1C 7.3 (H) 08/06/2021   HGBA1C 8.2 (H) 04/26/2020   HGBA1C 6.5 (A) 09/06/2019   ESRSEDRATE 19 (H) 08/06/2021   ESRSEDRATE 39 (H) 09/22/2019   CRP 2.0 (H) 08/06/2021   CRP 1.7 (H) 09/22/2019   REPTSTATUS 08/09/2021 FINAL 08/07/2021   CULT (A) 08/07/2021    >=100,000 COLONIES/mL ESCHERICHIA COLI Confirmed Extended Spectrum Beta-Lactamase Producer (ESBL).  In bloodstream infections from ESBL organisms, carbapenems are preferred over piperacillin/tazobactam. They are shown to have a lower risk of mortality.    LABORGA ESCHERICHIA COLI (A) 08/07/2021     Lab Results  Component Value Date   ALBUMIN 3.4 (L) 08/07/2021   ALBUMIN 4.1 08/06/2021   ALBUMIN 3.3 (L) 04/26/2020   PREALBUMIN 12.9 (L) 09/23/2019    Lab Results  Component  Value Date   MG 2.0 09/25/2019   No results found for: "VD25OH"  Lab Results  Component Value Date   PREALBUMIN 12.9 (L) 09/23/2019      Latest Ref Rng & Units 08/11/2021    2:44 AM 08/10/2021   10:13 AM 08/09/2021    7:50 AM  CBC EXTENDED  WBC 4.0 - 10.5 K/uL 6.7  6.3  6.3   RBC 4.22 - 5.81 MIL/uL 4.32  4.42  4.55   Hemoglobin 13.0 - 17.0 g/dL 13.1  13.2  13.6   HCT 39.0 - 52.0 % 38.8  39.4  40.3   Platelets 150 - 400 K/uL 192  199  212      There is no height or weight on file to calculate BMI.  Orders:  No orders of the defined types were placed in this encounter.  No orders of the defined types were placed in this encounter.    Procedures: No procedures performed  Clinical Data: No additional findings.  ROS:  All other systems negative, except as noted in the HPI. Review of Systems  Objective: Vital Signs: There were no vitals taken for this visit.  Specialty Comments:  No specialty comments available.  PMFS History: Patient Active Problem List   Diagnosis Date Noted   Pressure injury of skin 08/07/2021   Osteomyelitis of fifth toe of left foot ([place])  Acute cystitis with hematuria    Acute osteomyelitis (HCC) 08/06/2021   Chronic pain due to trauma 01/01/2021   Acute metabolic encephalopathy 04/26/2020   Pneumonia 04/26/2020   DM2 (diabetes mellitus, type 2) (HCC) 04/26/2020   History of DVT (deep vein thrombosis) 04/26/2020   UTI due to extended-spectrum beta lactamase (ESBL) producing Escherichia coli 04/26/2020   Community acquired pneumonia of left lower lobe of lung 04/26/2020   At risk for unstable body temperature 11/26/2019   Wheelchair dependence 11/26/2019   Pressure ulcer 09/22/2019   Cellulitis 09/22/2019   Hypokalemia 09/22/2019   Spasticity 03/08/2019   Erectile dysfunction due to diseases classified elsewhere 03/08/2019   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/22/2019   Neurogenic bowel 01/22/2019   Neurogenic bladder 01/22/2019   Myofascial  pain 01/22/2019   Spinal cord injury at T1-T6 level (HCC) 12/30/2018   Quadriplegia (HCC) 12/30/2018   Past Medical History:  Diagnosis Date   Diabetes mellitus without complication (HCC)    History of cervical fracture     Family History  Problem Relation Age of Onset   Diabetes Mother    Diabetes Father     Past Surgical History:  Procedure Laterality Date   AMPUTATION Left 08/10/2021   Procedure: LEFT 5TH RAY AMPUTATION;  Surgeon: [name], MD;  Location: [facility] OR;  Service: Orthopedics;  Laterality: Left;   CERVICAL SPINE SURGERY     Social History   Occupational History   Occupation: Disabled  Tobacco Use   Smoking status: Never   Smokeless tobacco: Never  Vaping Use   Vaping Use: Former  Substance and Sexual Activity   Alcohol use: Yes    Comment: social   Drug use: No   Sexual activity: Not on file

## 2021-12-11 ENCOUNTER — Other Ambulatory Visit: Payer: Self-pay | Admitting: Physical Medicine and Rehabilitation

## 2021-12-13 ENCOUNTER — Emergency Department (HOSPITAL_COMMUNITY)
Admission: EM | Admit: 2021-12-13 | Discharge: 2021-12-13 | Disposition: A | Discharge status: Home / Self Care | Payer: Medicare Other | Attending: Emergency Medicine | Admitting: Emergency Medicine

## 2021-12-13 ENCOUNTER — Encounter (HOSPITAL_COMMUNITY): Payer: Self-pay

## 2021-12-13 DIAGNOSIS — Z7984 Long term (current) use of oral hypoglycemic drugs: Secondary | ICD-10-CM | POA: Insufficient documentation

## 2021-12-13 DIAGNOSIS — E119 Type 2 diabetes mellitus without complications: Secondary | ICD-10-CM | POA: Diagnosis not present

## 2021-12-13 DIAGNOSIS — N3091 Cystitis, unspecified with hematuria: Secondary | ICD-10-CM | POA: Insufficient documentation

## 2021-12-13 DIAGNOSIS — Z7901 Long term (current) use of anticoagulants: Secondary | ICD-10-CM | POA: Diagnosis not present

## 2021-12-13 DIAGNOSIS — R319 Hematuria, unspecified: Secondary | ICD-10-CM | POA: Diagnosis present

## 2021-12-13 LAB — URINALYSIS, ROUTINE W REFLEX MICROSCOPIC
Glucose, UA: 100 mg/dL — AB
Ketones, ur: 15 mg/dL — AB
Nitrite: POSITIVE — AB
Protein, ur: >300 mg/dL — AB
Specific Gravity, Urine: 1.015 (ref 1.005–1.030)
pH: 7 (ref 5.0–8.0)

## 2021-12-13 LAB — CBC WITH DIFFERENTIAL/PLATELET
Abs Immature Granulocytes: 0.02 10*3/uL (ref 0.00–0.07)
Basophils Absolute: 0.1 10*3/uL (ref 0.0–0.1)
Basophils Relative: 1 %
Eosinophils Absolute: 0.2 10*3/uL (ref 0.0–0.5)
Eosinophils Relative: 3 %
HCT: 41.5 % (ref 39.0–52.0)
Hemoglobin: 14 g/dL (ref 13.0–17.0)
Immature Granulocytes: 0 %
Lymphocytes Relative: 29 %
Lymphs Abs: 2.3 10*3/uL (ref 0.7–4.0)
MCH: 30 pg (ref 26.0–34.0)
MCHC: 33.7 g/dL (ref 30.0–36.0)
MCV: 89.1 fL (ref 80.0–100.0)
Monocytes Absolute: 0.5 10*3/uL (ref 0.1–1.0)
Monocytes Relative: 7 %
Neutro Abs: 4.9 10*3/uL (ref 1.7–7.7)
Neutrophils Relative %: 60 %
Platelets: 218 10*3/uL (ref 150–400)
RBC: 4.66 MIL/uL (ref 4.22–5.81)
RDW: 13.1 % (ref 11.5–15.5)
WBC: 8 10*3/uL (ref 4.0–10.5)
nRBC: 0 % (ref 0.0–0.2)

## 2021-12-13 LAB — BASIC METABOLIC PANEL
Anion gap: 10 (ref 5–15)
BUN: 7 mg/dL (ref 6–20)
CO2: 26 mmol/L (ref 22–32)
Calcium: 9.1 mg/dL (ref 8.9–10.3)
Chloride: 102 mmol/L (ref 98–111)
Creatinine, Ser: 0.58 mg/dL — ABNORMAL LOW (ref 0.61–1.24)
GFR, Estimated: >60 mL/min (ref >60)
Glucose, Bld: 232 mg/dL — ABNORMAL HIGH (ref 70–99)
Potassium: 3.5 mmol/L (ref 3.5–5.1)
Sodium: 138 mmol/L (ref 135–145)

## 2021-12-13 LAB — URINALYSIS, MICROSCOPIC (REFLEX)
RBC / HPF: >50 RBC/hpf (ref 0–5)
Squamous Epithelial / HPF: NONE SEEN (ref 0–5)

## 2021-12-13 MED ORDER — ACETAMINOPHEN 325 MG PO TABS
650.0000 mg | ORAL_TABLET | Freq: Once | ORAL | Status: AC
  Administered 2021-12-13: 650 mg via ORAL
  Filled 2021-12-13: qty 2

## 2021-12-13 MED ORDER — FOSFOMYCIN TROMETHAMINE 3 G PO PACK
3.0000 g | PACK | Freq: Once | ORAL | Status: AC
Start: 2021-12-13 — End: 2021-12-13
  Administered 2021-12-13: 3 g via ORAL
  Filled 2021-12-13: qty 3

## 2021-12-13 MED ORDER — NITROFURANTOIN MONOHYD MACRO 100 MG PO CAPS: 100.0000 mg | ORAL_CAPSULE | Freq: Two times a day (BID) | ORAL | 0 refills | Status: DC

## 2021-12-13 MED ORDER — FOSFOMYCIN TROMETHAMINE 3 G PO PACK: 3.0000 g | PACK | Freq: Once | ORAL | Status: DC

## 2021-12-13 MED ORDER — LACTATED RINGERS IV BOLUS
1000.0000 mL | Freq: Once | INTRAVENOUS | Status: AC
  Administered 2021-12-13: 1000 mL via INTRAVENOUS

## 2021-12-13 NOTE — ED Notes (Signed)
Pt's foley removed.  New 72F foley placed without incident.  Scant bloody urine returned.  Not enough to collect a sample at this time. MD aware.

## 2021-12-13 NOTE — ED Notes (Signed)
PT waiting for PTAR . 

## 2021-12-13 NOTE — ED Notes (Signed)
Patient's family came to pick patient up instead of waiting for PTAR; Pt hoyer lifted by 3 staff members into patient's home electric wheelchair with family present; Pt safely placed in wheelchair and assisted to exit with family-Monique,RN

## 2021-12-13 NOTE — ED Provider Notes (Addendum)
[facility] EMERGENCY DEPARTMENT Provider Note  CSN: [id number] Arrival date & time: 12/13/21 1242  Chief Complaint(s) Hematuria  HPI [name] is a 56 y.o. 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  Patient reports hematuria for a few weeks.  He denies any fevers or chills.  He reports he has no sensation below the neck so he is not sure if he is experiencing any abdominal pain, chest pain, extremity pain.  He denies any sensation of shortness of breath.  No headaches, vision changes.  Triage note notes that patient complains of left leg swelling although using interpreter with the patient, he denies that this is why he is here, reports chronic leg swelling due to previous DVT, this leg swelling currently is unchanged and that is not why he is in the emergency department today.   Qualified healthcare interpreter used  Past Medical History Past Medical History:  Diagnosis Date   Diabetes mellitus without complication ([place])    History of cervical fracture    Patient Active Problem List   Diagnosis Date Noted   Pressure injury of skin 08/07/2021   Osteomyelitis of fifth toe of left foot ([place])    Acute cystitis with hematuria    Acute osteomyelitis ([place]) 08/06/2021   Chronic pain due to trauma 81/85/6314   Acute metabolic encephalopathy 97/03/6376   Pneumonia 04/26/2020   DM2 (diabetes mellitus, type 2) ([place]) 04/26/2020   History of DVT (deep vein thrombosis) 04/26/2020   UTI due to extended-spectrum beta lactamase (ESBL) producing Escherichia coli 04/26/2020   Community acquired pneumonia of left lower lobe of lung 04/26/2020   At risk for unstable body temperature 11/26/2019   Wheelchair dependence 11/26/2019   Pressure ulcer 09/22/2019   Cellulitis 09/22/2019   Hypokalemia 09/22/2019   Spasticity 03/08/2019   Erectile dysfunction due to diseases classified  elsewhere 03/08/2019   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/22/2019   Neurogenic bowel 01/22/2019   Neurogenic bladder 01/22/2019   Myofascial pain 01/22/2019   Spinal cord injury at T1-T6 level ([place]) 12/30/2018   Quadriplegia ([place]) 12/30/2018   Home Medication(s) Prior to Admission medications   Medication Sig Start Date End Date Taking? Authorizing Provider  nitrofurantoin, macrocrystal-monohydrate, (MACROBID) 100 MG capsule Take 1 capsule (100 mg total) by mouth 2 (two) times daily. 12/13/21  Yes Cristie Hem, MD  acetaminophen (TYLENOL) 500 MG tablet Take 500 mg by mouth every 6 (six) hours as needed for mild pain.    [provider]  apixaban (ELIQUIS) 5 MG TABS tablet Take 1 tablet (5 mg total) by mouth 2 (two) times daily. Please make sure pt has THREE refills- wrote this 3 months ago and bottles says no refills 04/09/21   Lovorn, Jinny Blossom, MD  baclofen (LIORESAL) 10 MG tablet TAKE 1 TABLET BY MOUTH 3 TIMES DAILY AS NEEDED FOR MUSCLE SPASMS OR tightness 12/11/21   Lovorn, Jinny Blossom, MD  BISACODYL LAXATIVE RE Place 1 suppository rectally at bedtime.    [provider]  blood glucose meter kit and supplies Dispense based on patient and insurance preference. Use up to four times daily as directed. (FOR ICD-10 E10.9, E11.9). 05/27/19   Kerin Perna, NP  Carboxymethylcellulose Sodium (ARTIFICIAL TEARS OP) Apply 1-2 drops to eye daily as needed (dry eyes).     [provider]  FLUoxetine (PROZAC) 20 MG capsule Has 3 refills!!!!! TAKE 1 CAPSULE BY MOUTH DAILY Needs appt for refills  04/09/21   Lovorn, Jinny Blossom, MD  furosemide (LASIX) 40 MG tablet Take 1 tablet daily for 6 days, take as needed after that for swelling or weight gain 09/25/19   Lavina Hamman, MD  gabapentin (NEURONTIN) 600 MG tablet TAKE 1 TABLET BY MOUTH 3 TIMES DAILY 01/23/21   Lovorn, Jinny Blossom, MD  glipiZIDE (GLUCOTROL) 10 MG tablet Take 10 mg by mouth 2 (two) times daily. 04/08/20   [provider]   HYDROcodone-acetaminophen (NORCO) 5-325 MG tablet Take 1 tablet by mouth 2 (two) times daily as needed for moderate pain. 11/16/21   Lovorn, Jinny Blossom, MD  hydrOXYzine (ATARAX/VISTARIL) 25 MG tablet Take 1 tablet (25 mg total) by mouth 3 (three) times daily as needed for itching. 09/25/19   Lavina Hamman, MD  JANUVIA 100 MG tablet Take 100 mg by mouth daily. 02/14/21   [provider]  metFORMIN (GLUCOPHAGE) 1000 MG tablet SMARTSIG:1 Tablet(s) By Mouth Morning-Evening 02/14/21   [provider]  sildenafil (VIAGRA) 100 MG tablet Take 1 tablet (100 mg total) by mouth daily as needed for erectile dysfunction. 01/01/21   Lovorn, Jinny Blossom, MD  sitaGLIPtin-metformin (JANUMET) 50-1000 MG tablet Take 1 tablet by mouth 2 (two) times daily with a meal. 05/27/19   Kerin Perna, NP                                                                                                                                    Past Surgical History Past Surgical History:  Procedure Laterality Date   AMPUTATION Left 08/10/2021   Procedure: LEFT 5TH RAY AMPUTATION;  Surgeon: Newt Minion, MD;  Location: [facility];  Service: Orthopedics;  Laterality: Left;   CERVICAL SPINE SURGERY     Family History Family History  Problem Relation Age of Onset   Diabetes Mother    Diabetes Father     Social History Social History   Tobacco Use   Smoking status: Never   Smokeless tobacco: Never  Vaping Use   Vaping Use: Former  Substance Use Topics   Alcohol use: Yes    Comment: social   Drug use: No   Allergies Patient has no known allergies.  Review of Systems Review of Systems  All other systems reviewed and are negative.   Physical Exam Vital Signs  I have reviewed the triage vital signs BP 124/78   Pulse 65   Temp 97.7 F (36.5 C) (Oral)   Resp 17   SpO2 97%  Physical Exam Vitals and nursing note reviewed.  Constitutional:      General: He is not in acute distress.    Appearance: Normal  appearance.  HENT:     Mouth/Throat:     Mouth: Mucous membranes are moist.  Eyes:     Conjunctiva/sclera: Conjunctivae normal.  Cardiovascular:     Rate and Rhythm: Normal rate and regular rhythm.  Pulmonary:     Effort: Pulmonary effort  is normal. No respiratory distress.     Breath sounds: Normal breath sounds.  Abdominal:     General: Abdomen is flat.     Palpations: Abdomen is soft.     Tenderness: There is no abdominal tenderness.  Musculoskeletal:     Comments: Left calf enlarged compared to right calf, no erythema or warmth, tenderness.  2+ distal pulses bilaterally  Skin:    General: Skin is warm and dry.     Capillary Refill: Capillary refill takes less than 2 seconds.  Neurological:     Mental Status: He is alert and oriented to person, place, and time. Mental status is at baseline.  Psychiatric:        Mood and Affect: Mood normal.        Behavior: Behavior normal.     ED Results and Treatments Labs (all labs ordered are listed, but only abnormal results are displayed) Labs Reviewed  URINALYSIS, ROUTINE W REFLEX MICROSCOPIC - Abnormal; Notable for the following components:      Result Value   Color, Urine RED (*)    APPearance TURBID (*)    Glucose, UA 100 (*)    Hgb urine dipstick LARGE (*)    Bilirubin Urine MODERATE (*)    Ketones, ur 15 (*)    Protein, ur >300 (*)    Nitrite POSITIVE (*)    Leukocytes,Ua LARGE (*)    All other components within normal limits  BASIC METABOLIC PANEL - Abnormal; Notable for the following components:   Glucose, Bld 232 (*)    Creatinine, Ser 0.58 (*)    All other components within normal limits  URINALYSIS, MICROSCOPIC (REFLEX) - Abnormal; Notable for the following components:   Bacteria, UA MANY (*)    All other components within normal limits  URINE CULTURE  CBC WITH DIFFERENTIAL/PLATELET                                                                                                                           Radiology No results found.  Pertinent labs & imaging results that were available during my care of the patient were reviewed by me and considered in my medical decision making (see MDM for details).  Medications Ordered in ED Medications  lactated ringers bolus 1,000 mL (0 mLs Intravenous Stopped 12/13/21 1651)  fosfomycin (MONUROL) packet 3 g (3 g Oral Given 12/13/21 1722)  acetaminophen (TYLENOL) tablet 650 mg (650 mg Oral Given 12/13/21 1722)  Procedures Procedures  (including critical care time)  Medical Decision Making / ED Course   MDM:  56 year old male presenting to the emergency department with hematuria.  Patient does not have signs of obstruction.  Urine is free-flowing PVR less than 5.  Will exchange Foley catheter to collect fresh culture and obtain basic labs.  Will obtain urinalysis off of fresh Foley catheter.  Suspect likely cystitis with hematuria. Lower concern for nephrolithiasis with gross hematuria over past few weeks, likely infectious without clinical worsening. He has been referred to urology by PMD. Patient overall well-appearing and at neurologic baseline.  He has left leg swelling compared to the right, however this is reportedly chronic, he and his wife report this is entirely unchanged, it gets better at night when he elevates his legs, and he has been compliant with his anticoagulation.  Very low concern for new DVT.  No erythema or warmth to suggest any infectious process.  Will reassess after urinalysis obtained.  Clinical Course as of 12/14/21 0842  Thu Dec 13, 2021  1630 Urinalysis suspicious for hemorrhagic cystitis. It appears patient has had this before. His prior cultures show signs of ESBL. He is clinically well appearing without leukocytosis, fever, nausea, vomiting. Low concern for pyelonephritis. Doubt  nephrolithiasis. Will give 1x dose of fosfomycin. Foley replaced. Sent prescription for macrobid as outpatient fosfomycin not on patient insurance plan. Advised strict return precautions for any worsening symptoms given co-morbidities [WS]    Clinical Course User Index [WS] Cristie Hem, MD     Additional history obtained: -Additional history obtained from family -External records from outside source obtained and reviewed including: Chart review including previous notes, labs, imaging, consultation notes including prior urine cultures   Lab Tests: -I ordered, reviewed, and interpreted labs.   The pertinent results include:   Labs Reviewed  URINALYSIS, ROUTINE W REFLEX MICROSCOPIC - Abnormal; Notable for the following components:      Result Value   Color, Urine RED (*)    APPearance TURBID (*)    Glucose, UA 100 (*)    Hgb urine dipstick LARGE (*)    Bilirubin Urine MODERATE (*)    Ketones, ur 15 (*)    Protein, ur >300 (*)    Nitrite POSITIVE (*)    Leukocytes,Ua LARGE (*)    All other components within normal limits  BASIC METABOLIC PANEL - Abnormal; Notable for the following components:   Glucose, Bld 232 (*)    Creatinine, Ser 0.58 (*)    All other components within normal limits  URINALYSIS, MICROSCOPIC (REFLEX) - Abnormal; Notable for the following components:   Bacteria, UA MANY (*)    All other components within normal limits  URINE CULTURE  CBC WITH DIFFERENTIAL/PLATELET    Notable for signs of urinary infection   Medicines ordered and prescription drug management: Meds ordered this encounter  Medications   lactated ringers bolus 1,000 mL   DISCONTD: fosfomycin (MONUROL) packet 3 g   nitrofurantoin, macrocrystal-monohydrate, (MACROBID) 100 MG capsule    Sig: Take 1 capsule (100 mg total) by mouth 2 (two) times daily.    Dispense:  10 capsule    Refill:  0   fosfomycin (MONUROL) packet 3 g   acetaminophen (TYLENOL) tablet 650 mg    -I have reviewed  the patients home medicines and have made adjustments as needed  Social Determinants of Health:  Diagnosis or treatment significantly limited by social determinants of health: obesity   Reevaluation: After the interventions noted above,  I reevaluated the patient and found that they have improved  Co morbidities that complicate the patient evaluation  Past Medical History:  Diagnosis Date   Diabetes mellitus without complication ([place])    History of cervical fracture       Dispostion: Disposition decision including need for hospitalization was considered, and patient discharged from emergency department.    Final Clinical Impression(s) / ED Diagnoses Final diagnoses:  Hemorrhagic cystitis     This chart was dictated using voice recognition software.  Despite best efforts to proofread,  errors can occur which can change the documentation meaning.    Cristie Hem, MD 12/14/21 2929    Cristie Hem, MD 12/14/21 [phone]

## 2021-12-13 NOTE — Discharge Instructions (Addendum)
Regrese al departamento de emergencias si sus sntomas empeoran o si presenta fiebre, escalofros, sudoracin, nuseas, vmitos o aturdimiento.  Espere de 2 a 3 das para ver si los antibiticos que le damos tratan sus sntomas. Si tienes sntomas persistentes puedes probar los antibiticos que te he recetado.  Please return to the emergency department if your symptoms worsen or you develop fevers, chills, sweating, nausea, vomiting, or lightheadedness.  Please wait 2-3 days to see if the antibiotics we give you treat your symptoms. If you have persistent symptoms you can try the antibiotics I have prescribed for you.

## 2021-12-13 NOTE — ED Triage Notes (Signed)
Pt BIB in by EMS for complaints of hematuria for unknown time, estimates about a month.  Pt is a paraplegic and has a chronic foley.  Red blood noted in foley bag.  Pt also has complaints of left leg swelling.

## 2021-12-17 LAB — URINE CULTURE: Culture: >=100000 — AB

## 2021-12-18 ENCOUNTER — Telehealth (HOSPITAL_BASED_OUTPATIENT_CLINIC_OR_DEPARTMENT_OTHER): Payer: Self-pay | Admitting: *Deleted

## 2021-12-18 NOTE — Telephone Encounter (Signed)
Post ED Visit - Positive Culture Follow-up  Culture report reviewed by antimicrobial stewardship pharmacist: [place] Team []  Elenor Quinones, Pharm.D. []  Heide Guile, Pharm.D., BCPS AQ-ID []  Parks Neptune, Pharm.D., BCPS []  Alycia Rossetti, Pharm.D., BCPS []  [place], Pharm.D., BCPS, AAHIVP []  Legrand Como, Pharm.D., BCPS, AAHIVP []  Salome Arnt, PharmD, BCPS []  Johnnette Gourd, PharmD, BCPS []  Hughes Better, PharmD, BCPS []  Leeroy Cha, PharmD []  Laqueta Linden, PharmD, BCPS []  Albertina Parr, PharmD  [place] Team []  Leodis Sias, PharmD []  Lindell Spar, PharmD []  Royetta Asal, PharmD []  Graylin Shiver, Rph []  Rema Fendt) Glennon Mac, PharmD []  Arlyn Dunning, PharmD []  Netta Cedars, PharmD []  Dia Sitter, PharmD []  Leone Haven, PharmD []  Gretta Arab, PharmD []  Theodis Shove, PharmD []  Peggyann Juba, PharmD []  Reuel Boom, PharmD   Positive urine culture Treated with Fosfomycin in the ED, organism sensitive to the same and no further patient follow-up is required at this time.  Jeneen Rinks, Pharm D  [name] Talley 12/18/2021, 9:02 AM

## 2022-01-04 ENCOUNTER — Encounter (HOSPITAL_COMMUNITY): Payer: Self-pay

## 2022-01-04 ENCOUNTER — Emergency Department (HOSPITAL_COMMUNITY): Payer: Medicare Other

## 2022-01-04 ENCOUNTER — Emergency Department (HOSPITAL_COMMUNITY)
Admission: EM | Admit: 2022-01-04 | Discharge: 2022-01-04 | Disposition: A | Discharge status: Home / Self Care | Payer: Medicare Other | Attending: Emergency Medicine | Admitting: Emergency Medicine

## 2022-01-04 ENCOUNTER — Other Ambulatory Visit: Payer: Self-pay

## 2022-01-04 DIAGNOSIS — N3001 Acute cystitis with hematuria: Secondary | ICD-10-CM | POA: Insufficient documentation

## 2022-01-04 DIAGNOSIS — M542 Cervicalgia: Secondary | ICD-10-CM | POA: Diagnosis not present

## 2022-01-04 DIAGNOSIS — Z7901 Long term (current) use of anticoagulants: Secondary | ICD-10-CM | POA: Diagnosis not present

## 2022-01-04 DIAGNOSIS — R319 Hematuria, unspecified: Secondary | ICD-10-CM | POA: Diagnosis present

## 2022-01-04 LAB — COMPREHENSIVE METABOLIC PANEL
ALT: 22 U/L (ref 0–44)
AST: 14 U/L — ABNORMAL LOW (ref 15–41)
Albumin: 3.2 g/dL — ABNORMAL LOW (ref 3.5–5.0)
Alkaline Phosphatase: 76 U/L (ref 38–126)
Anion gap: 12 (ref 5–15)
BUN: 9 mg/dL (ref 6–20)
CO2: 24 mmol/L (ref 22–32)
Calcium: 8.7 mg/dL — ABNORMAL LOW (ref 8.9–10.3)
Chloride: 100 mmol/L (ref 98–111)
Creatinine, Ser: 0.61 mg/dL (ref 0.61–1.24)
GFR, Estimated: >60 mL/min (ref >60)
Glucose, Bld: 228 mg/dL — ABNORMAL HIGH (ref 70–99)
Potassium: 3.1 mmol/L — ABNORMAL LOW (ref 3.5–5.1)
Sodium: 136 mmol/L (ref 135–145)
Total Bilirubin: 1 mg/dL (ref 0.3–1.2)
Total Protein: 6.9 g/dL (ref 6.5–8.1)

## 2022-01-04 LAB — LACTIC ACID, PLASMA: Lactic Acid, Venous: 1 mmol/L (ref 0.5–1.9)

## 2022-01-04 LAB — CBC WITH DIFFERENTIAL/PLATELET
Abs Immature Granulocytes: 0.06 10*3/uL (ref 0.00–0.07)
Basophils Absolute: 0 10*3/uL (ref 0.0–0.1)
Basophils Relative: 0 %
Eosinophils Absolute: 0.1 10*3/uL (ref 0.0–0.5)
Eosinophils Relative: 2 %
HCT: 37.7 % — ABNORMAL LOW (ref 39.0–52.0)
Hemoglobin: 12.4 g/dL — ABNORMAL LOW (ref 13.0–17.0)
Immature Granulocytes: 1 %
Lymphocytes Relative: 21 %
Lymphs Abs: 1.9 10*3/uL (ref 0.7–4.0)
MCH: 29.8 pg (ref 26.0–34.0)
MCHC: 32.9 g/dL (ref 30.0–36.0)
MCV: 90.6 fL (ref 80.0–100.0)
Monocytes Absolute: 0.7 10*3/uL (ref 0.1–1.0)
Monocytes Relative: 8 %
Neutro Abs: 6.3 10*3/uL (ref 1.7–7.7)
Neutrophils Relative %: 68 %
Platelets: 187 10*3/uL (ref 150–400)
RBC: 4.16 MIL/uL — ABNORMAL LOW (ref 4.22–5.81)
RDW: 12.9 % (ref 11.5–15.5)
WBC: 9.1 10*3/uL (ref 4.0–10.5)
nRBC: 0 % (ref 0.0–0.2)

## 2022-01-04 LAB — URINALYSIS, MICROSCOPIC (REFLEX)

## 2022-01-04 LAB — URINALYSIS, ROUTINE W REFLEX MICROSCOPIC

## 2022-01-04 LAB — APTT: aPTT: 38 seconds — ABNORMAL HIGH (ref 24–36)

## 2022-01-04 LAB — PROTIME-INR
INR: 1.5 — ABNORMAL HIGH (ref 0.8–1.2)
Prothrombin Time: 18.1 seconds — ABNORMAL HIGH (ref 11.4–15.2)

## 2022-01-04 MED ORDER — NITROFURANTOIN MONOHYD MACRO 100 MG PO CAPS: 100.0000 mg | ORAL_CAPSULE | Freq: Two times a day (BID) | ORAL | 0 refills | Status: DC

## 2022-01-04 MED ORDER — FOSFOMYCIN TROMETHAMINE 3 G PO PACK
3.0000 g | PACK | Freq: Once | ORAL | Status: AC
  Administered 2022-01-04: 3 g via ORAL
  Filled 2022-01-04: qty 3

## 2022-01-04 NOTE — ED Notes (Signed)
Patient transported to CT 

## 2022-01-04 NOTE — ED Triage Notes (Signed)
Pt arrived POV from home c/o hematuria that has been on going for over a month. Pt was seen at the beginning of the month and states it stopped for 4 days and started again. Pt is also c/o neck pain and states he takes pain medicine but it only helps for a little bit. Pt is a paraplegic.

## 2022-01-04 NOTE — Discharge Instructions (Addendum)
It is important that you follow-up with our urologist.  Please call for an appointment next week.  Equally important that you follow-up with your primary care physician.  Please obtain and take all medication as discussed.  Es importante que hagas seguimiento con nuestro urlogo. Por favor llame para programar una cita la prxima semana. Es igualmente importante que haga un seguimiento con su mdico de atencin primaria. [place] y tome todos los medicamentos como se indic.  For additional pain relief please obtain and use the patch as described below.  Para un alivio adicional del [place], [place] y [place] el parche como se describe a continuacin.

## 2022-01-04 NOTE — ED Provider Notes (Signed)
[facility] EMERGENCY DEPARTMENT Provider Note   CSN: [id number] Arrival date & time: 01/04/22  1007     History  Chief Complaint  Patient presents with   Hematuria    [name] is a 56 y.o. male.  HPI Patient presents with his wife who assists with the history.  Patient has prior spinal cord injury, and is technically quadriplegic, though he essentially notes lack of sensation and motor beyond roughly T4.  He is able to move his arms.  He has previously diagnosed history of cystitis, has indwelling catheter.  He notes that over the past weeks he has had ongoing bleeding in his urine, though the quantity of urine is roughly the same.  He presents with this concern as well as concern for new posterior neck pain.  No objective fever though he feels feverish.  No vomiting.    Home Medications Prior to Admission medications   Medication Sig Start Date End Date Taking? Authorizing Provider  acetaminophen (TYLENOL) 500 MG tablet Take 500 mg by mouth every 6 (six) hours as needed for mild pain.    [provider]  apixaban (ELIQUIS) 5 MG TABS tablet Take 1 tablet (5 mg total) by mouth 2 (two) times daily. Please make sure pt has THREE refills- wrote this 3 months ago and bottles says no refills 04/09/21   Lovorn, Jinny Blossom, MD  baclofen (LIORESAL) 10 MG tablet TAKE 1 TABLET BY MOUTH 3 TIMES DAILY AS NEEDED FOR MUSCLE SPASMS OR tightness 12/11/21   Lovorn, Jinny Blossom, MD  BISACODYL LAXATIVE RE Place 1 suppository rectally at bedtime.    [provider]  blood glucose meter kit and supplies Dispense based on patient and insurance preference. Use up to four times daily as directed. (FOR ICD-10 E10.9, E11.9). 05/27/19   Kerin Perna, NP  Carboxymethylcellulose Sodium (ARTIFICIAL TEARS OP) Apply 1-2 drops to eye daily as needed (dry eyes).     [provider]  FLUoxetine (PROZAC) 20 MG capsule Has 3 refills!!!!! TAKE 1 CAPSULE BY MOUTH DAILY Needs appt  for refills 04/09/21   Lovorn, Jinny Blossom, MD  furosemide (LASIX) 40 MG tablet Take 1 tablet daily for 6 days, take as needed after that for swelling or weight gain 09/25/19   Lavina Hamman, MD  gabapentin (NEURONTIN) 600 MG tablet TAKE 1 TABLET BY MOUTH 3 TIMES DAILY 01/23/21   Lovorn, Jinny Blossom, MD  glipiZIDE (GLUCOTROL) 10 MG tablet Take 10 mg by mouth 2 (two) times daily. 04/08/20   [provider]  HYDROcodone-acetaminophen (NORCO) 5-325 MG tablet Take 1 tablet by mouth 2 (two) times daily as needed for moderate pain. 11/16/21   Lovorn, Jinny Blossom, MD  hydrOXYzine (ATARAX/VISTARIL) 25 MG tablet Take 1 tablet (25 mg total) by mouth 3 (three) times daily as needed for itching. 09/25/19   Lavina Hamman, MD  JANUVIA 100 MG tablet Take 100 mg by mouth daily. 02/14/21   [provider]  metFORMIN (GLUCOPHAGE) 1000 MG tablet SMARTSIG:1 Tablet(s) By Mouth Morning-Evening 02/14/21   [provider]  nitrofurantoin, macrocrystal-monohydrate, (MACROBID) 100 MG capsule Take 1 capsule (100 mg total) by mouth 2 (two) times daily. 01/04/22   Carmin Muskrat, MD  sildenafil (VIAGRA) 100 MG tablet Take 1 tablet (100 mg total) by mouth daily as needed for erectile dysfunction. 01/01/21   Lovorn, Jinny Blossom, MD  sitaGLIPtin-metformin (JANUMET) 50-1000 MG tablet Take 1 tablet by mouth 2 (two) times daily with a meal. 05/27/19   Kerin Perna, NP  Allergies    Patient has no known allergies.    Review of Systems   Review of Systems  All other systems reviewed and are negative.   Physical Exam Updated Vital Signs BP 110/87   Pulse 78   Temp 98.2 F (36.8 C) (Oral)   Resp 19   SpO2 97%  Physical Exam Vitals and nursing note reviewed.  Constitutional:      General: He is not in acute distress.    Appearance: He is well-developed. He is ill-appearing.  HENT:     Head: Normocephalic and atraumatic.  Eyes:     Conjunctiva/sclera: Conjunctivae normal.  Neck:     Comments: Patient moves  his neck spontaneously in all dimensions, notes tenderness to palpation in the lower midline region.  There is some firmness in this region, but no erythema. Cardiovascular:     Rate and Rhythm: Normal rate and regular rhythm.  Pulmonary:     Effort: Pulmonary effort is normal. No respiratory distress.     Breath sounds: No stridor.  Abdominal:     General: There is no distension.  Genitourinary:    Comments: Foley catheter draining dark liquid Skin:    General: Skin is warm and dry.  Neurological:     Mental Status: He is alert and oriented to person, place, and time.     Comments: Upper extremity unremarkable.  Patient is paraplegic.     ED Results / Procedures / Treatments   Labs (all labs ordered are listed, but only abnormal results are displayed) Labs Reviewed  CBC WITH DIFFERENTIAL/PLATELET - Abnormal; Notable for the following components:      Result Value   RBC 4.16 (*)    Hemoglobin 12.4 (*)    HCT 37.7 (*)    All other components within normal limits  URINALYSIS, ROUTINE W REFLEX MICROSCOPIC - Abnormal; Notable for the following components:   Color, Urine RED (*)    APPearance TURBID (*)    Glucose, UA   (*)    Value: TEST NOT REPORTED DUE TO COLOR INTERFERENCE OF URINE PIGMENT   Hgb urine dipstick   (*)    Value: TEST NOT REPORTED DUE TO COLOR INTERFERENCE OF URINE PIGMENT   Bilirubin Urine   (*)    Value: TEST NOT REPORTED DUE TO COLOR INTERFERENCE OF URINE PIGMENT   Ketones, ur   (*)    Value: TEST NOT REPORTED DUE TO COLOR INTERFERENCE OF URINE PIGMENT   Protein, ur   (*)    Value: TEST NOT REPORTED DUE TO COLOR INTERFERENCE OF URINE PIGMENT   Nitrite   (*)    Value: TEST NOT REPORTED DUE TO COLOR INTERFERENCE OF URINE PIGMENT   Leukocytes,Ua   (*)    Value: TEST NOT REPORTED DUE TO COLOR INTERFERENCE OF URINE PIGMENT   All other components within normal limits  COMPREHENSIVE METABOLIC PANEL - Abnormal; Notable for the following components:   Potassium  3.1 (*)    Glucose, Bld 228 (*)    Calcium 8.7 (*)    Albumin 3.2 (*)    AST 14 (*)    All other components within normal limits  PROTIME-INR - Abnormal; Notable for the following components:   Prothrombin Time 18.1 (*)    INR 1.5 (*)    All other components within normal limits  APTT - Abnormal; Notable for the following components:   aPTT 38 (*)    All other components within normal limits  URINALYSIS, MICROSCOPIC (REFLEX) -  Abnormal; Notable for the following components:   Bacteria, UA MANY (*)    All other components within normal limits  CULTURE, BLOOD (ROUTINE X 2)  CULTURE, BLOOD (ROUTINE X 2)  URINE CULTURE  LACTIC ACID, PLASMA  LACTIC ACID, PLASMA    EKG EKG Interpretation  Date/Time:  Friday January 04 2022 06:47:08 EST Ventricular Rate:  79 PR Interval:  215 QRS Duration: 104 QT Interval:  370 QTC Calculation: 425 R Axis:   24 Text Interpretation: Sinus rhythm Prolonged PR interval Artifact Otherwise within normal limits Confirmed by Carmin Muskrat [id number]) on 01/04/2022 8:57:48 AM  Radiology CT Cervical Spine Wo Contrast  Result Date: 01/04/2022 CLINICAL DATA:  Acute neck pain.  Prior cervical spine surgery. EXAM: CT CERVICAL SPINE WITHOUT CONTRAST TECHNIQUE: Multidetector CT imaging of the cervical spine was performed without intravenous contrast. Multiplanar CT image reconstructions were also generated. RADIATION DOSE REDUCTION: This exam was performed according to the departmental dose-optimization program which includes automated exposure control, adjustment of the mA and/or kV according to patient size and/or use of iterative reconstruction technique. COMPARISON:  01/25/2005 FINDINGS: Alignment: Normal. Skull base and vertebrae: Vertebral body heights are normal. There is moderate spondylosis throughout the cervical spine to include uncovertebral joint spurring and facet arthropathy. Posterior fusion hardware is present from C4 extending into the upper  thoracic spine and off the inferior field of view. Visualized fusion hardware is intact. Moderate bony fusion mass over the upper thoracic spine at the T2 level. Moderate bilateral neural foraminal narrowing at the C3-4 level with mild left-sided neural from narrowing at the C4-5, C5-6 and C6-7 levels. Mild diffuse decreased bone mineralization from C5-T1. No acute fracture or subluxation. Soft tissues and spinal canal: Prevertebral soft tissues are normal. Moderate canal stenosis at the C6-7 level and minimally at the C7-T1 level. Significant compromise of the spinal canal at the level of the bony fusion mass over the T1-2 and T2-3 levels. Disc levels: Mild disc space narrowing at the C6-7 level. No clear disc space identified at the T1-2 and T2-3 levels due to significant bony fusion mass from previous surgery. Upper chest: No acute findings. Other: None. IMPRESSION: 1. No acute fracture. 2. Moderate spondylosis throughout the cervical spine with mild disc disease at the C6-7 level. Moderate canal stenosis at the C6-7 level and minimally at the C7-T1 level. Significant canal stenosis over the visualized upper thoracic spine at the T1-2 and T2-3 levels. Bilateral neural foraminal narrowing at the C3-4 level with mild left-sided neural foraminal narrowing at the C4-5, C5-6 and C6-7 levels. 3. Posterior fusion hardware from C4 extending into the upper thoracic spine as the visualized hardware is intact. Bony fusion mass over the upper thoracic spine at the T2-3 level. Electronically Signed   By: Marin Olp M.D.   On: 01/04/2022 08:56   DG Chest Port 1 View  Result Date: 01/04/2022 CLINICAL DATA:  Questionable sepsis - evaluate for abnormality EXAM: PORTABLE CHEST 1 VIEW COMPARISON:  Radiograph 04/26/2020 FINDINGS: Unchanged cardiomediastinal silhouette. There is no focal airspace consolidation. There is no pleural effusion or pneumothorax. There is no acute osseous abnormality. Cervicothoracic fusion hardware  noted. IMPRESSION: No evidence of acute cardiopulmonary disease. Electronically Signed   By: Maurine Simmering M.D.   On: 01/04/2022 08:10    Procedures Procedures    Medications Ordered in ED Medications  fosfomycin (MONUROL) packet 3 g (3 g Oral Given 01/04/22 1051)    ED Course/ Medical Decision Making/ A&P  Medical Decision Making Patient with prior spinal cord injury, neurogenic bladder, previously diagnosed acute cystitis now presents with ongoing hematuria as well as neck pain.  Patient is awake and alert, afebrile here, but with concern for fever at home, differential including bacteremia, sepsis, urinary tract infection, sympathetic dysfunction considered.  With history of prior neck surgery, sources including neck, chest, urine, blood evaluated with CT x-ray labs urine.  Amount and/or Complexity of Data Reviewed Independent Historian: spouse    Details: HPI External Data Reviewed: notes.    Details: Recent evaluation for osteomyelitis foot, as well as prior diagnosis of acute cystitis with hematuria Labs: ordered. Decision-making details documented in ED Course. Radiology: ordered. Decision-making details documented in ED Course. ECG/medicine tests: ordered. Decision-making details documented in ED Course.  Risk OTC drugs. Prescription drug management. Decision regarding hospitalization. Diagnosis or treatment significantly limited by social determinants of health.   On repeat evaluation the patient is awake, alert, in no distress, speaking clearly.  Patient remains afebrile, hemodynamically unremarkable.  No leukocytosis, no lactic acidosis, lower suspicion for bacteremia, sepsis.  I reviewed the CT imaging, x-rays with the patient and his wife.  Some suspicion for hemorrhagic cystitis, as an entity he has experienced in the past.  Patient was started fosfomycin here, Macrobid at home, will follow with urology.  Family notes they have previously been  unable to complete the outpatient follow-up, they are encouraged to do so and provided additional resources in that regard. Though hospitalization was a consideration given the patient's baseline status, and is given his reassuring labs, close outpatient follow-up is reasonable.        Final Clinical Impression(s) / ED Diagnoses Final diagnoses:  Acute cystitis with hematuria  Neck pain    Rx / DC Orders ED Discharge Orders          Ordered    nitrofurantoin, macrocrystal-monohydrate, (MACROBID) 100 MG capsule  2 times daily        01/04/22 0933              Carmin Muskrat, MD 01/04/22 1350

## 2022-01-05 LAB — URINE CULTURE: Culture: >=100000 — AB

## 2022-01-09 LAB — CULTURE, BLOOD (ROUTINE X 2): Culture: NO GROWTH

## 2022-01-17 ENCOUNTER — Other Ambulatory Visit: Payer: Self-pay | Admitting: Physical Medicine and Rehabilitation

## 2022-02-28 DIAGNOSIS — I10 Essential (primary) hypertension: Secondary | ICD-10-CM | POA: Diagnosis present

## 2022-03-01 ENCOUNTER — Encounter: Payer: Self-pay | Admitting: Physical Medicine and Rehabilitation

## 2022-03-01 ENCOUNTER — Encounter: Payer: 59 | Attending: Physical Medicine and Rehabilitation | Admitting: Physical Medicine and Rehabilitation

## 2022-03-01 VITALS — BP 115/73 | HR 62 | Temp 98.2°F | Ht 71.0 in

## 2022-03-01 DIAGNOSIS — N319 Neuromuscular dysfunction of bladder, unspecified: Secondary | ICD-10-CM | POA: Diagnosis present

## 2022-03-01 DIAGNOSIS — G894 Chronic pain syndrome: Secondary | ICD-10-CM

## 2022-03-01 DIAGNOSIS — G8921 Chronic pain due to trauma: Secondary | ICD-10-CM

## 2022-03-01 DIAGNOSIS — Z5181 Encounter for therapeutic drug level monitoring: Secondary | ICD-10-CM | POA: Diagnosis present

## 2022-03-01 DIAGNOSIS — Z79891 Long term (current) use of opiate analgesic: Secondary | ICD-10-CM

## 2022-03-01 DIAGNOSIS — G8250 Quadriplegia, unspecified: Secondary | ICD-10-CM | POA: Diagnosis present

## 2022-03-01 DIAGNOSIS — R252 Cramp and spasm: Secondary | ICD-10-CM

## 2022-03-01 DIAGNOSIS — M7918 Myalgia, other site: Secondary | ICD-10-CM

## 2022-03-01 DIAGNOSIS — R310 Gross hematuria: Secondary | ICD-10-CM | POA: Diagnosis present

## 2022-03-01 MED ORDER — HYDROCODONE-ACETAMINOPHEN 5-325 MG PO TABS: 1.0000 | ORAL_TABLET | Freq: Two times a day (BID) | ORAL | 0 refills | Status: DC | PRN

## 2022-03-01 MED ORDER — LIDOCAINE HCL 1 % IJ SOLN
9.0000 mL | Freq: Once | INTRAMUSCULAR | Status: AC
  Administered 2022-03-01: 9 mL via INTRADERMAL

## 2022-03-01 NOTE — Progress Notes (Signed)
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    Here for f/u on SCI and trigger point injections.   Just got from [place]- went well-   Pain about the same-  Has 11 pills left, had some painful situations in [place].    Plan: Will need Oral drug screen for pain meds and contract-    Since prescribing pain meds for [place].    2.   Will send in another Rx for Norco- has 11 pills left- will sned in Norco 5/325 mg #30- as needed for severe pain.    3. Hasn't gone to see Urology yet - they never called- will place referral for [facility]- Dr Claudia Desanctis preferably. Really needs Cystoscopy-  due to painless bleeding- also has neurogenic bladder- will place referral.    4. Needs to let me know if doesn't hear from them-    5. Patient here for trigger point injections for myofascial pain  Consent done and on chart.  Cleaned areas with alcohol and injected using a 27 gauge 1.5 inch needle  Injected 7cc Using 1% Lidocaine with no EPI  Upper traps B/L x2 Levators- B/L  Posterior scalenes Middle scalenes- B/L x2 Splenius Capitus- B/L  Pectoralis Major- B/L  Rhomboids- B/L x2- and cervical paraspinals B/L  Infraspinatus Teres Major/minor Thoracic paraspinals Lumbar paraspinals Other injections- B/L forearms x2   Patient's level of pain prior was 7/10 Current level of pain after injections is 5/10  There was no bleeding or complications.  Patient was advised to drink a lot of water on day after injections to flush system Will have increased soreness for 12-48 hours after injections.  Can use Lidocaine patches the day AFTER injections Can use theracane on day of injections in places didn't inject Can use heating pad 4-6 hours AFTER injections    6. F/U in 2 months- double appt- SCI/trigger point injections.    I spent a total of  35  minutes on total care today- >50% coordination of care- due to  10 minutes on trigger point injections- 25 minutes discussing bladder issues; and pain issues with contract needed.

## 2022-03-01 NOTE — Patient Instructions (Signed)
Plan: Will need Oral drug screen for pain meds and contract-    Since prescribing pain meds for [place].    2.   Will send in another Rx for Norco- has 11 pills left- will sned in Norco 5/325 mg #30- as needed for severe pain.    3. Hasn't gone to see Urology yet - they never called- will place referral for [facility]- Dr Claudia Desanctis preferably. Really needs Cystoscopy-  due to painless bleeding- also has neurogenic bladder- will place referral.    4. Needs to let me know if doesn't hear from them-    5. Patient here for trigger point injections for myofascial pain  Consent done and on chart.  Cleaned areas with alcohol and injected using a 27 gauge 1.5 inch needle  Injected 7cc Using 1% Lidocaine with no EPI  Upper traps B/L x2 Levators- B/L  Posterior scalenes Middle scalenes- B/L x2 Splenius Capitus- B/L  Pectoralis Major- B/L  Rhomboids- B/L x2- and cervical paraspinals B/L  Infraspinatus Teres Major/minor Thoracic paraspinals Lumbar paraspinals Other injections- B/L forearms x2   Patient's level of pain prior was 7/10 Current level of pain after injections is 5/10  There was no bleeding or complications.  Patient was advised to drink a lot of water on day after injections to flush system Will have increased soreness for 12-48 hours after injections.  Can use Lidocaine patches the day AFTER injections Can use theracane on day of injections in places didn't inject Can use heating pad 4-6 hours AFTER injections   6. F/U in 3 months- double appt- SCI/trigger point injections.

## 2022-03-05 LAB — DRUG TOX MONITOR 1 W/CONF, ORAL FLD
Amphetamines: NEGATIVE ng/mL (ref <10)
Barbiturates: NEGATIVE ng/mL (ref <10)
Benzodiazepines: NEGATIVE ng/mL (ref <0.50)
Benzoylecgonine: NEGATIVE ng/mL (ref <5.0)
Buprenorphine: NEGATIVE ng/mL (ref <0.10)
Cocaine: NEGATIVE ng/mL (ref <5.0)
Cocaine: NEGATIVE ng/mL (ref <5.0)
Fentanyl: NEGATIVE ng/mL (ref <0.10)
Heroin Metabolite: NEGATIVE ng/mL (ref <1.0)
MARIJUANA: NEGATIVE ng/mL (ref <2.5)
MDMA: NEGATIVE ng/mL (ref <10)
Meprobamate: NEGATIVE ng/mL (ref <2.5)
Methadone: NEGATIVE ng/mL (ref <5.0)
Nicotine Metabolite: NEGATIVE ng/mL (ref <5.0)
Opiates: NEGATIVE ng/mL (ref <2.5)
Phencyclidine: NEGATIVE ng/mL (ref <10)
Tapentadol: NEGATIVE ng/mL (ref <5.0)
Tramadol: NEGATIVE ng/mL (ref <5.0)
Zolpidem: NEGATIVE ng/mL (ref <5.0)

## 2022-03-05 LAB — DRUG TOX ALC METAB W/CON, ORAL FLD: Alcohol Metabolite: NEGATIVE ng/mL (ref <25)

## 2022-03-14 ENCOUNTER — Other Ambulatory Visit: Payer: Self-pay | Admitting: Physical Medicine and Rehabilitation

## 2022-05-01 ENCOUNTER — Telehealth: Payer: Self-pay | Admitting: *Deleted

## 2022-05-01 ENCOUNTER — Encounter: Payer: 59 | Attending: Physical Medicine and Rehabilitation | Admitting: Physical Medicine and Rehabilitation

## 2022-05-01 ENCOUNTER — Encounter: Payer: Self-pay | Admitting: Physical Medicine and Rehabilitation

## 2022-05-01 VITALS — BP 105/71 | HR 68

## 2022-05-01 DIAGNOSIS — N319 Neuromuscular dysfunction of bladder, unspecified: Secondary | ICD-10-CM | POA: Diagnosis present

## 2022-05-01 DIAGNOSIS — M7918 Myalgia, other site: Secondary | ICD-10-CM | POA: Diagnosis present

## 2022-05-01 DIAGNOSIS — G8921 Chronic pain due to trauma: Secondary | ICD-10-CM

## 2022-05-01 DIAGNOSIS — G8250 Quadriplegia, unspecified: Secondary | ICD-10-CM | POA: Diagnosis present

## 2022-05-01 DIAGNOSIS — R252 Cramp and spasm: Secondary | ICD-10-CM | POA: Diagnosis present

## 2022-05-01 DIAGNOSIS — Z993 Dependence on wheelchair: Secondary | ICD-10-CM

## 2022-05-01 MED ORDER — SILDENAFIL CITRATE 100 MG PO TABS: 100.0000 mg | ORAL_TABLET | Freq: Every day | ORAL | 3 refills | Status: DC | PRN

## 2022-05-01 MED ORDER — LIDOCAINE HCL 1 % IJ SOLN
6.0000 mL | Freq: Once | INTRAMUSCULAR | Status: AC
  Administered 2022-05-01: 6 mL

## 2022-05-01 MED ORDER — FLUOXETINE HCL 20 MG PO CAPS: 20.0000 mg | ORAL_CAPSULE | Freq: Every day | ORAL | 3 refills | Status: DC

## 2022-05-01 NOTE — Telephone Encounter (Signed)
Oral drug screen was negative for medications. Reported last taken two days prior to test.

## 2022-05-01 NOTE — Progress Notes (Signed)
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   Here for f/u on SCI as well as trigger point injections.    Hasn't heard Urology yet- to make appointment.  No more UTI's since last saw him.  Catheter is already blocked- put Foley in on the weekend.   Muscle spasms/spasticity about the same.   Taking Norco for neck pain- sometimes 1-2x/week usually- unless traveling. Last took 8 days ago, of note. But will take a few in a row- then goes 2-4 weeks without taking it.   It hurts a lot more when weather changes-   Wants trigger point injections today- it helps him not use pain meds as often-  lasts ~ 1 month when he gets them   Plan:  Needs to use a dilute vinegar solution with SALINE, not water- 1 teaspoon  of white vinegar in 1 liter of  of saline- can use for the week, and have to get rid of rest- then make another vinegar solution for the next week. Leave 60cc/ml in bladder for 15-20 minutes- then let it come out/reattach Foley bag back to the catheter.  Can do as often as every 2 days, but most of my patients do the irrigation ~ 1x/week.   2. Let me know if any other questions on irrigating bladder- it is done to break up the Sediment in the bladder- and could look like wet cement the first time done.    3. Last UDS 03/01/22- takes rarely- will con't to check at least 2x/year-    Patient here for trigger point injections for myofascial /neck pain  Consent done and on chart.  Cleaned areas with alcohol and injected using a 27 gauge 1.5 inch needle  Injected 6 cc- no wastage Using 1% Lidocaine with no EPI  Upper traps B/L  Levators- B/L  Posterior scalenes Middle scalenes B/L  Splenius Capitus- B/L  Pectoralis  Major- B/L  Rhomboids- BL x2 Infraspinatus Teres Major/minor Thoracic paraspinals Lumbar paraspinals Other injections- B/L triceps-    Patient's level of pain prior was  6/10 Current level of pain after injections is- 3-4/10- ready to dance  There was no bleeding or complications.  Patient was advised to drink a lot of water on day after injections to flush system Will have increased soreness for 12-48 hours after injections.  Can use Lidocaine patches the day AFTER injections Can use theracane on day of injections in places didn't inject Can use heating pad 4-6 hours AFTER injections   4. Refill Prozac for mood- con't meds 20 mg daily-    5. Refill Viagra for intimacy- 100 mg as needed- sent in 3 months supply   6. Has refills on Gabapentin and Baclofen- con't meds  7. Con't Norco as needed for pain  8. F/U in 2 months- double appointment- SCI   I spent a total of   33  minutes on total care today- >50% coordination of care- due to 10 minutes on injections and 23 minutes discussing pain, as well as catheter blockage/sediment- and how to treat.

## 2022-05-01 NOTE — Patient Instructions (Signed)
Plan:  Needs to use a dilute vinegar solution with SALINE, not water- 1 teaspoon  of white vinegar in 1 liter of  of saline- can use for the week, and have to get rid of rest- then make another vinegar solution for the next week. Leave 60cc/ml in bladder for 15-20 minutes- then let it come out/reattach Foley bag back to the catheter.  Can do as often as every 2 days, but most of my patients do the irrigation ~ 1x/week.   2. Let me know if any other questions on irrigating bladder- it is done to break up the Sediment in the bladder- and could look like wet cement the first time done.    3. Last UDS 03/01/22- takes rarely- will con't to check at least 2x/year-    Patient here for trigger point injections for myofascial /neck pain  Consent done and on chart.  Cleaned areas with alcohol and injected using a 27 gauge 1.5 inch needle  Injected 6 cc- no wastage Using 1% Lidocaine with no EPI  Upper traps B/L  Levators- B/L  Posterior scalenes Middle scalenes B/L  Splenius Capitus- B/L  Pectoralis Major- B/L  Rhomboids- BL x2 Infraspinatus Teres Major/minor Thoracic paraspinals Lumbar paraspinals Other injections- B/L triceps-    Patient's level of pain prior was  6/10 Current level of pain after injections is- 3-4/10- ready to dance  There was no bleeding or complications.  Patient was advised to drink a lot of water on day after injections to flush system Will have increased soreness for 12-48 hours after injections.  Can use Lidocaine patches the day AFTER injections Can use theracane on day of injections in places didn't inject Can use heating pad 4-6 hours AFTER injections   4. Refill Prozac for mood- con't meds 20 mg daily-    5. Refill Viagra for intimacy- 100 mg as needed- sent in 3 months supply   6. Has refills on Gabapentin and Baclofen- con't meds  7. Con't Norco as needed for pain  8. F/U in 2 months- double appointment-

## 2022-05-01 NOTE — Addendum Note (Signed)
Addended by: Jasmine December T on: 05/01/2022 09:53 AM   Modules accepted: Orders

## 2022-06-15 ENCOUNTER — Other Ambulatory Visit: Payer: Self-pay

## 2022-06-15 ENCOUNTER — Emergency Department (HOSPITAL_COMMUNITY)
Admission: EM | Admit: 2022-06-15 | Discharge: 2022-06-15 | Disposition: A | Discharge status: Home / Self Care | Payer: 59 | Attending: Emergency Medicine | Admitting: Emergency Medicine

## 2022-06-15 ENCOUNTER — Encounter (HOSPITAL_COMMUNITY): Payer: Self-pay | Admitting: *Deleted

## 2022-06-15 DIAGNOSIS — Z7984 Long term (current) use of oral hypoglycemic drugs: Secondary | ICD-10-CM | POA: Diagnosis not present

## 2022-06-15 DIAGNOSIS — Z79899 Other long term (current) drug therapy: Secondary | ICD-10-CM | POA: Insufficient documentation

## 2022-06-15 DIAGNOSIS — Z86711 Personal history of pulmonary embolism: Secondary | ICD-10-CM | POA: Diagnosis not present

## 2022-06-15 DIAGNOSIS — L89312 Pressure ulcer of right buttock, stage 2: Secondary | ICD-10-CM | POA: Diagnosis not present

## 2022-06-15 DIAGNOSIS — Z95828 Presence of other vascular implants and grafts: Secondary | ICD-10-CM | POA: Diagnosis not present

## 2022-06-15 DIAGNOSIS — Z794 Long term (current) use of insulin: Secondary | ICD-10-CM | POA: Diagnosis not present

## 2022-06-15 DIAGNOSIS — L89152 Pressure ulcer of sacral region, stage 2: Secondary | ICD-10-CM | POA: Diagnosis present

## 2022-06-15 DIAGNOSIS — Z86718 Personal history of other venous thrombosis and embolism: Secondary | ICD-10-CM | POA: Diagnosis not present

## 2022-06-15 DIAGNOSIS — E119 Type 2 diabetes mellitus without complications: Secondary | ICD-10-CM | POA: Insufficient documentation

## 2022-06-15 DIAGNOSIS — Z7901 Long term (current) use of anticoagulants: Secondary | ICD-10-CM | POA: Diagnosis not present

## 2022-06-15 LAB — CBC
HCT: 35.7 % — ABNORMAL LOW (ref 39.0–52.0)
Hemoglobin: 12 g/dL — ABNORMAL LOW (ref 13.0–17.0)
MCH: 29.1 pg (ref 26.0–34.0)
MCHC: 33.6 g/dL (ref 30.0–36.0)
MCV: 86.4 fL (ref 80.0–100.0)
Platelets: 330 10*3/uL (ref 150–400)
RBC: 4.13 MIL/uL — ABNORMAL LOW (ref 4.22–5.81)
RDW: 13.1 % (ref 11.5–15.5)
WBC: 7.7 10*3/uL (ref 4.0–10.5)
nRBC: 0 % (ref 0.0–0.2)

## 2022-06-15 LAB — COMPREHENSIVE METABOLIC PANEL
ALT: 26 U/L (ref 0–44)
AST: 22 U/L (ref 15–41)
Albumin: 3.1 g/dL — ABNORMAL LOW (ref 3.5–5.0)
Alkaline Phosphatase: 76 U/L (ref 38–126)
Anion gap: 11 (ref 5–15)
BUN: 16 mg/dL (ref 6–20)
CO2: 23 mmol/L (ref 22–32)
Calcium: 8.5 mg/dL — ABNORMAL LOW (ref 8.9–10.3)
Chloride: 98 mmol/L (ref 98–111)
Creatinine, Ser: 0.95 mg/dL (ref 0.61–1.24)
GFR, Estimated: >60 mL/min (ref >60)
Glucose, Bld: 201 mg/dL — ABNORMAL HIGH (ref 70–99)
Potassium: 3.7 mmol/L (ref 3.5–5.1)
Sodium: 132 mmol/L — ABNORMAL LOW (ref 135–145)
Total Bilirubin: 0.5 mg/dL (ref 0.3–1.2)
Total Protein: 7.4 g/dL (ref 6.5–8.1)

## 2022-06-15 LAB — URINALYSIS, ROUTINE W REFLEX MICROSCOPIC
Glucose, UA: NEGATIVE mg/dL
Ketones, ur: NEGATIVE mg/dL
Nitrite: POSITIVE — AB
Protein, ur: >=300 mg/dL — AB
RBC / HPF: >50 RBC/hpf (ref 0–5)
Specific Gravity, Urine: 1.02 (ref 1.005–1.030)
WBC, UA: >50 WBC/hpf (ref 0–5)
pH: 7 (ref 5.0–8.0)

## 2022-06-15 LAB — LIPASE, BLOOD: Lipase: 37 U/L (ref 11–51)

## 2022-06-15 NOTE — Discharge Instructions (Addendum)
Gracias por venir al [employer] [facility]. Le atendieron por heridas sacras. Hicimos un examen, laboratorios e imgenes, y [employer] tres lceras por presin en el sacro en etapa 2. Consulte las instrucciones adjuntas para Advice worker. Le hemos proporcionado una derivacin a la clnica de heridas que puede ver en el seguimiento. A veces las derivaciones que colocamos al departamento de emergencias no se concretan. Si no ha recibido [employer] un par [employer], [employer] con su proveedor de atencin primaria, quien tambin puede realizar una derivacin. La enfermera discuti con usted extensamente algunas recomendaciones sobre la lcera. Puede hablar con el administrador de casos o con su mdico de atencin primaria sobre un colchn especializado. Tambin puedes cambiar de posicin con frecuencia, lo que puede ayudar.   Haga un seguimiento con su proveedor de atencin primaria dentro de 1 semana.  No dude en regresar al servicio de urgencias o llamar al 911 si experimenta: -Empeoramiento de los sntomas -Signos de infeccin que incluyen fiebre, escalofros, drenaje de pus de la herida, aumento del enrojecimiento o hinchazn. -Aturdimiento, desmayo. -Fiebre/escalofros -Cualquier otra cosa que te preocupe
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
## 2022-07-05 ENCOUNTER — Emergency Department (HOSPITAL_COMMUNITY): Payer: 59

## 2022-07-05 ENCOUNTER — Inpatient Hospital Stay (HOSPITAL_COMMUNITY)
Admission: EM | Admit: 2022-07-05 | Discharge: 2022-07-12 | DRG: 853 | Disposition: A | Discharge status: Home Health | Payer: 59 | Attending: Internal Medicine | Admitting: Internal Medicine

## 2022-07-05 ENCOUNTER — Other Ambulatory Visit: Payer: Self-pay

## 2022-07-05 ENCOUNTER — Encounter (HOSPITAL_COMMUNITY): Payer: Self-pay | Admitting: Emergency Medicine

## 2022-07-05 DIAGNOSIS — Z1611 Resistance to penicillins: Secondary | ICD-10-CM | POA: Diagnosis present

## 2022-07-05 DIAGNOSIS — D649 Anemia, unspecified: Secondary | ICD-10-CM | POA: Diagnosis present

## 2022-07-05 DIAGNOSIS — X58XXXS Exposure to other specified factors, sequela: Secondary | ICD-10-CM | POA: Diagnosis present

## 2022-07-05 DIAGNOSIS — Z6841 Body Mass Index (BMI) 40.0 and over, adult: Secondary | ICD-10-CM

## 2022-07-05 DIAGNOSIS — Z1623 Resistance to quinolones and fluoroquinolones: Secondary | ICD-10-CM | POA: Diagnosis present

## 2022-07-05 DIAGNOSIS — Z993 Dependence on wheelchair: Secondary | ICD-10-CM

## 2022-07-05 DIAGNOSIS — N319 Neuromuscular dysfunction of bladder, unspecified: Secondary | ICD-10-CM | POA: Diagnosis present

## 2022-07-05 DIAGNOSIS — N3000 Acute cystitis without hematuria: Secondary | ICD-10-CM | POA: Diagnosis present

## 2022-07-05 DIAGNOSIS — Z794 Long term (current) use of insulin: Secondary | ICD-10-CM

## 2022-07-05 DIAGNOSIS — A4151 Sepsis due to Escherichia coli [E. coli]: Principal | ICD-10-CM | POA: Diagnosis present

## 2022-07-05 DIAGNOSIS — S14107S Unspecified injury at C7 level of cervical spinal cord, sequela: Secondary | ICD-10-CM

## 2022-07-05 DIAGNOSIS — Z86718 Personal history of other venous thrombosis and embolism: Secondary | ICD-10-CM

## 2022-07-05 DIAGNOSIS — G8220 Paraplegia, unspecified: Secondary | ICD-10-CM

## 2022-07-05 DIAGNOSIS — D75839 Thrombocytosis, unspecified: Secondary | ICD-10-CM | POA: Diagnosis not present

## 2022-07-05 DIAGNOSIS — Z95828 Presence of other vascular implants and grafts: Secondary | ICD-10-CM

## 2022-07-05 DIAGNOSIS — I96 Gangrene, not elsewhere classified: Secondary | ICD-10-CM | POA: Diagnosis present

## 2022-07-05 DIAGNOSIS — R509 Fever, unspecified: Secondary | ICD-10-CM | POA: Diagnosis not present

## 2022-07-05 DIAGNOSIS — Z86711 Personal history of pulmonary embolism: Secondary | ICD-10-CM

## 2022-07-05 DIAGNOSIS — E871 Hypo-osmolality and hyponatremia: Secondary | ICD-10-CM | POA: Diagnosis not present

## 2022-07-05 DIAGNOSIS — L89154 Pressure ulcer of sacral region, stage 4: Principal | ICD-10-CM | POA: Diagnosis present

## 2022-07-05 DIAGNOSIS — Z79899 Other long term (current) drug therapy: Secondary | ICD-10-CM

## 2022-07-05 DIAGNOSIS — Z1612 Extended spectrum beta lactamase (ESBL) resistance: Secondary | ICD-10-CM | POA: Diagnosis present

## 2022-07-05 DIAGNOSIS — A419 Sepsis, unspecified organism: Secondary | ICD-10-CM | POA: Diagnosis present

## 2022-07-05 DIAGNOSIS — E8809 Other disorders of plasma-protein metabolism, not elsewhere classified: Secondary | ICD-10-CM | POA: Diagnosis not present

## 2022-07-05 DIAGNOSIS — Z833 Family history of diabetes mellitus: Secondary | ICD-10-CM

## 2022-07-05 DIAGNOSIS — N3001 Acute cystitis with hematuria: Secondary | ICD-10-CM | POA: Diagnosis present

## 2022-07-05 DIAGNOSIS — L03317 Cellulitis of buttock: Secondary | ICD-10-CM | POA: Diagnosis present

## 2022-07-05 DIAGNOSIS — Z603 Acculturation difficulty: Secondary | ICD-10-CM | POA: Diagnosis present

## 2022-07-05 DIAGNOSIS — A4159 Other Gram-negative sepsis: Secondary | ICD-10-CM | POA: Diagnosis present

## 2022-07-05 DIAGNOSIS — E119 Type 2 diabetes mellitus without complications: Secondary | ICD-10-CM | POA: Diagnosis present

## 2022-07-05 DIAGNOSIS — G8929 Other chronic pain: Secondary | ICD-10-CM | POA: Diagnosis present

## 2022-07-05 DIAGNOSIS — Z7984 Long term (current) use of oral hypoglycemic drugs: Secondary | ICD-10-CM

## 2022-07-05 DIAGNOSIS — Z7901 Long term (current) use of anticoagulants: Secondary | ICD-10-CM

## 2022-07-05 DIAGNOSIS — G8250 Quadriplegia, unspecified: Secondary | ICD-10-CM | POA: Diagnosis present

## 2022-07-05 DIAGNOSIS — I10 Essential (primary) hypertension: Secondary | ICD-10-CM | POA: Diagnosis present

## 2022-07-05 HISTORY — DX: Dyspnea, unspecified: R06.00

## 2022-07-05 HISTORY — DX: Essential (primary) hypertension: I10

## 2022-07-05 HISTORY — DX: Paralytic syndrome, unspecified: G83.9

## 2022-07-05 LAB — COMPREHENSIVE METABOLIC PANEL
ALT: 47 U/L — ABNORMAL HIGH (ref 0–44)
AST: 41 U/L (ref 15–41)
Albumin: 2.7 g/dL — ABNORMAL LOW (ref 3.5–5.0)
Alkaline Phosphatase: 65 U/L (ref 38–126)
Anion gap: 10 (ref 5–15)
BUN: 11 mg/dL (ref 6–20)
CO2: 23 mmol/L (ref 22–32)
Calcium: 8.5 mg/dL — ABNORMAL LOW (ref 8.9–10.3)
Chloride: 101 mmol/L (ref 98–111)
Creatinine, Ser: 0.83 mg/dL (ref 0.61–1.24)
GFR, Estimated: >60 mL/min (ref >60)
Glucose, Bld: 145 mg/dL — ABNORMAL HIGH (ref 70–99)
Potassium: 3.9 mmol/L (ref 3.5–5.1)
Sodium: 134 mmol/L — ABNORMAL LOW (ref 135–145)
Total Bilirubin: 0.8 mg/dL (ref 0.3–1.2)
Total Protein: 7.6 g/dL (ref 6.5–8.1)

## 2022-07-05 LAB — CBC WITH DIFFERENTIAL/PLATELET
Abs Immature Granulocytes: 0.05 10*3/uL (ref 0.00–0.07)
Basophils Absolute: 0 10*3/uL (ref 0.0–0.1)
Basophils Relative: 1 %
Eosinophils Absolute: 0.2 10*3/uL (ref 0.0–0.5)
Eosinophils Relative: 2 %
HCT: 31.8 % — ABNORMAL LOW (ref 39.0–52.0)
Hemoglobin: 10.3 g/dL — ABNORMAL LOW (ref 13.0–17.0)
Immature Granulocytes: 1 %
Lymphocytes Relative: 16 %
Lymphs Abs: 1.4 10*3/uL (ref 0.7–4.0)
MCH: 28.3 pg (ref 26.0–34.0)
MCHC: 32.4 g/dL (ref 30.0–36.0)
MCV: 87.4 fL (ref 80.0–100.0)
Monocytes Absolute: 0.6 10*3/uL (ref 0.1–1.0)
Monocytes Relative: 6 %
Neutro Abs: 6.6 10*3/uL (ref 1.7–7.7)
Neutrophils Relative %: 74 %
Platelets: 304 10*3/uL (ref 150–400)
RBC: 3.64 MIL/uL — ABNORMAL LOW (ref 4.22–5.81)
RDW: 13.6 % (ref 11.5–15.5)
WBC: 8.9 10*3/uL (ref 4.0–10.5)
nRBC: 0 % (ref 0.0–0.2)

## 2022-07-05 LAB — URINALYSIS, ROUTINE W REFLEX MICROSCOPIC
Glucose, UA: NEGATIVE mg/dL
Ketones, ur: NEGATIVE mg/dL
Nitrite: NEGATIVE
Protein, ur: 100 mg/dL — AB
RBC / HPF: >50 RBC/hpf (ref 0–5)
Specific Gravity, Urine: 1.015 (ref 1.005–1.030)
WBC, UA: >50 WBC/hpf (ref 0–5)
pH: 8 (ref 5.0–8.0)

## 2022-07-05 LAB — LACTIC ACID, PLASMA: Lactic Acid, Venous: 1.1 mmol/L (ref 0.5–1.9)

## 2022-07-05 MED ORDER — METOCLOPRAMIDE HCL 5 MG/ML IJ SOLN
10.0000 mg | Freq: Once | INTRAMUSCULAR | Status: AC
  Administered 2022-07-06: 10 mg via INTRAVENOUS
  Filled 2022-07-05: qty 2

## 2022-07-05 MED ORDER — SODIUM CHLORIDE 0.9 % IV SOLN
1.0000 g | Freq: Once | INTRAVENOUS | Status: AC
  Administered 2022-07-06: 1 g via INTRAVENOUS
  Filled 2022-07-05: qty 10

## 2022-07-05 MED ORDER — IOHEXOL 300 MG/ML  SOLN
100.0000 mL | Freq: Once | INTRAMUSCULAR | Status: AC | PRN
  Administered 2022-07-05: 100 mL via INTRAVENOUS

## 2022-07-05 MED ORDER — DIPHENHYDRAMINE HCL 25 MG PO CAPS
25.0000 mg | ORAL_CAPSULE | Freq: Once | ORAL | Status: AC
  Administered 2022-07-06: 25 mg via ORAL
  Filled 2022-07-05: qty 1

## 2022-07-05 MED ORDER — ACETAMINOPHEN 500 MG PO TABS
1000.0000 mg | ORAL_TABLET | ORAL | Status: AC
  Administered 2022-07-06: 1000 mg via ORAL
  Filled 2022-07-05: qty 2

## 2022-07-05 MED ORDER — SODIUM CHLORIDE 0.9 % IV BOLUS
1000.0000 mL | Freq: Once | INTRAVENOUS | Status: AC
  Administered 2022-07-06: 1000 mL via INTRAVENOUS

## 2022-07-05 NOTE — ED Triage Notes (Addendum)
Pt arrives to ED via personal powered wheelchair. Sts having fevers /chills x1 week, new intermittent SOB x 2 days, and worsening wound to his buttocks. Urinary cath with visible sediment in place on arrival. No cough. No sick exposure. No chest pain.   Pt requires spanish speaking interpreter. Pt is poor historian as wife manages most of his care.
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
## 2022-07-06 ENCOUNTER — Encounter (HOSPITAL_COMMUNITY)
Admission: EM | Disposition: A | Discharge status: Home Health | Payer: Self-pay | Source: Home / Self Care | Attending: Internal Medicine

## 2022-07-06 ENCOUNTER — Other Ambulatory Visit: Payer: Self-pay

## 2022-07-06 ENCOUNTER — Inpatient Hospital Stay (HOSPITAL_COMMUNITY): Payer: 59 | Admitting: Anesthesiology

## 2022-07-06 ENCOUNTER — Encounter (HOSPITAL_COMMUNITY): Payer: Self-pay | Admitting: Internal Medicine

## 2022-07-06 DIAGNOSIS — I10 Essential (primary) hypertension: Secondary | ICD-10-CM | POA: Diagnosis present

## 2022-07-06 DIAGNOSIS — N3001 Acute cystitis with hematuria: Secondary | ICD-10-CM

## 2022-07-06 DIAGNOSIS — E8809 Other disorders of plasma-protein metabolism, not elsewhere classified: Secondary | ICD-10-CM | POA: Diagnosis not present

## 2022-07-06 DIAGNOSIS — L03317 Cellulitis of buttock: Secondary | ICD-10-CM | POA: Diagnosis present

## 2022-07-06 DIAGNOSIS — D649 Anemia, unspecified: Secondary | ICD-10-CM | POA: Diagnosis present

## 2022-07-06 DIAGNOSIS — G8250 Quadriplegia, unspecified: Secondary | ICD-10-CM

## 2022-07-06 DIAGNOSIS — I96 Gangrene, not elsewhere classified: Secondary | ICD-10-CM | POA: Diagnosis present

## 2022-07-06 DIAGNOSIS — E119 Type 2 diabetes mellitus without complications: Secondary | ICD-10-CM | POA: Diagnosis present

## 2022-07-06 DIAGNOSIS — Z993 Dependence on wheelchair: Secondary | ICD-10-CM | POA: Diagnosis not present

## 2022-07-06 DIAGNOSIS — Z86718 Personal history of other venous thrombosis and embolism: Secondary | ICD-10-CM

## 2022-07-06 DIAGNOSIS — A4151 Sepsis due to Escherichia coli [E. coli]: Secondary | ICD-10-CM | POA: Diagnosis present

## 2022-07-06 DIAGNOSIS — A4159 Other Gram-negative sepsis: Secondary | ICD-10-CM | POA: Diagnosis present

## 2022-07-06 DIAGNOSIS — L89154 Pressure ulcer of sacral region, stage 4: Secondary | ICD-10-CM | POA: Diagnosis present

## 2022-07-06 DIAGNOSIS — Z794 Long term (current) use of insulin: Secondary | ICD-10-CM

## 2022-07-06 DIAGNOSIS — L89159 Pressure ulcer of sacral region, unspecified stage: Secondary | ICD-10-CM

## 2022-07-06 DIAGNOSIS — Z7984 Long term (current) use of oral hypoglycemic drugs: Secondary | ICD-10-CM

## 2022-07-06 DIAGNOSIS — G8220 Paraplegia, unspecified: Secondary | ICD-10-CM | POA: Diagnosis not present

## 2022-07-06 DIAGNOSIS — A419 Sepsis, unspecified organism: Secondary | ICD-10-CM

## 2022-07-06 DIAGNOSIS — L0390 Cellulitis, unspecified: Secondary | ICD-10-CM | POA: Diagnosis not present

## 2022-07-06 DIAGNOSIS — Z95828 Presence of other vascular implants and grafts: Secondary | ICD-10-CM | POA: Diagnosis not present

## 2022-07-06 DIAGNOSIS — S14107S Unspecified injury at C7 level of cervical spinal cord, sequela: Secondary | ICD-10-CM | POA: Diagnosis not present

## 2022-07-06 DIAGNOSIS — N319 Neuromuscular dysfunction of bladder, unspecified: Secondary | ICD-10-CM | POA: Diagnosis present

## 2022-07-06 DIAGNOSIS — E871 Hypo-osmolality and hyponatremia: Secondary | ICD-10-CM | POA: Diagnosis not present

## 2022-07-06 DIAGNOSIS — Z79899 Other long term (current) drug therapy: Secondary | ICD-10-CM | POA: Diagnosis not present

## 2022-07-06 DIAGNOSIS — D75839 Thrombocytosis, unspecified: Secondary | ICD-10-CM | POA: Diagnosis not present

## 2022-07-06 DIAGNOSIS — Z1612 Extended spectrum beta lactamase (ESBL) resistance: Secondary | ICD-10-CM | POA: Diagnosis present

## 2022-07-06 DIAGNOSIS — Z1611 Resistance to penicillins: Secondary | ICD-10-CM | POA: Diagnosis present

## 2022-07-06 DIAGNOSIS — Z1623 Resistance to quinolones and fluoroquinolones: Secondary | ICD-10-CM | POA: Diagnosis present

## 2022-07-06 DIAGNOSIS — X58XXXS Exposure to other specified factors, sequela: Secondary | ICD-10-CM | POA: Diagnosis present

## 2022-07-06 DIAGNOSIS — Z6841 Body Mass Index (BMI) 40.0 and over, adult: Secondary | ICD-10-CM | POA: Diagnosis not present

## 2022-07-06 DIAGNOSIS — R509 Fever, unspecified: Secondary | ICD-10-CM | POA: Diagnosis present

## 2022-07-06 HISTORY — PX: INCISION AND DRAINAGE OF WOUND: SHX1803

## 2022-07-06 LAB — COMPREHENSIVE METABOLIC PANEL
ALT: 47 U/L — ABNORMAL HIGH (ref 0–44)
AST: 43 U/L — ABNORMAL HIGH (ref 15–41)
Albumin: 2.6 g/dL — ABNORMAL LOW (ref 3.5–5.0)
Alkaline Phosphatase: 60 U/L (ref 38–126)
Anion gap: 9 (ref 5–15)
BUN: 9 mg/dL (ref 6–20)
CO2: 22 mmol/L (ref 22–32)
Calcium: 8.3 mg/dL — ABNORMAL LOW (ref 8.9–10.3)
Chloride: 105 mmol/L (ref 98–111)
Creatinine, Ser: 0.67 mg/dL (ref 0.61–1.24)
GFR, Estimated: >60 mL/min (ref >60)
Glucose, Bld: 149 mg/dL — ABNORMAL HIGH (ref 70–99)
Potassium: 3.6 mmol/L (ref 3.5–5.1)
Sodium: 136 mmol/L (ref 135–145)
Total Bilirubin: 0.7 mg/dL (ref 0.3–1.2)
Total Protein: 7.2 g/dL (ref 6.5–8.1)

## 2022-07-06 LAB — GLUCOSE, CAPILLARY
Glucose-Capillary: 121 mg/dL — ABNORMAL HIGH (ref 70–99)
Glucose-Capillary: 149 mg/dL — ABNORMAL HIGH (ref 70–99)
Glucose-Capillary: 141 mg/dL — ABNORMAL HIGH (ref 70–99)
Glucose-Capillary: 220 mg/dL — ABNORMAL HIGH (ref 70–99)
Glucose-Capillary: 210 mg/dL — ABNORMAL HIGH (ref 70–99)

## 2022-07-06 LAB — CBC
HCT: 31 % — ABNORMAL LOW (ref 39.0–52.0)
Hemoglobin: 10 g/dL — ABNORMAL LOW (ref 13.0–17.0)
MCH: 28.7 pg (ref 26.0–34.0)
MCHC: 32.3 g/dL (ref 30.0–36.0)
MCV: 88.8 fL (ref 80.0–100.0)
Platelets: 285 10*3/uL (ref 150–400)
RBC: 3.49 MIL/uL — ABNORMAL LOW (ref 4.22–5.81)
RDW: 13.6 % (ref 11.5–15.5)
WBC: 8.6 10*3/uL (ref 4.0–10.5)
nRBC: 0 % (ref 0.0–0.2)

## 2022-07-06 LAB — APTT: aPTT: 46 seconds — ABNORMAL HIGH (ref 24–36)

## 2022-07-06 LAB — PROTIME-INR
INR: 1.6 — ABNORMAL HIGH (ref 0.8–1.2)
Prothrombin Time: 19 seconds — ABNORMAL HIGH (ref 11.4–15.2)

## 2022-07-06 LAB — C-REACTIVE PROTEIN: CRP: 15.8 mg/dL — ABNORMAL HIGH (ref <1.0)

## 2022-07-06 LAB — CK: Total CK: 108 U/L (ref 49–397)

## 2022-07-06 SURGERY — IRRIGATION AND DEBRIDEMENT WOUND
Anesthesia: General | Site: Buttocks

## 2022-07-06 MED ORDER — INSULIN ASPART 100 UNIT/ML IJ SOLN
0.0000 [IU] | Freq: Three times a day (TID) | INTRAMUSCULAR | Status: DC
  Administered 2022-07-06: 1 [IU] via SUBCUTANEOUS
  Administered 2022-07-06: 3 [IU] via SUBCUTANEOUS
  Administered 2022-07-07: 1 [IU] via SUBCUTANEOUS
  Administered 2022-07-07 (×2): 2 [IU] via SUBCUTANEOUS
  Administered 2022-07-08 (×3): 1 [IU] via SUBCUTANEOUS
  Administered 2022-07-09: 2 [IU] via SUBCUTANEOUS
  Administered 2022-07-09 – 2022-07-10 (×3): 1 [IU] via SUBCUTANEOUS
  Administered 2022-07-10 (×2): 2 [IU] via SUBCUTANEOUS
  Administered 2022-07-11: 1 [IU] via SUBCUTANEOUS
  Administered 2022-07-11: 2 [IU] via SUBCUTANEOUS
  Administered 2022-07-11: 1 [IU] via SUBCUTANEOUS
  Administered 2022-07-12: 3 [IU] via SUBCUTANEOUS
  Administered 2022-07-12: 2 [IU] via SUBCUTANEOUS

## 2022-07-06 MED ORDER — CHLORHEXIDINE GLUCONATE CLOTH 2 % EX PADS
6.0000 | MEDICATED_PAD | Freq: Every day | CUTANEOUS | Status: DC
  Administered 2022-07-06 – 2022-07-12 (×7): 6 via TOPICAL

## 2022-07-06 MED ORDER — PROMETHAZINE HCL 25 MG/ML IJ SOLN: 6.2500 mg | INTRAMUSCULAR | Status: DC | PRN

## 2022-07-06 MED ORDER — MORPHINE SULFATE (PF) 2 MG/ML IV SOLN: 2.0000 mg | INTRAVENOUS | Status: DC | PRN

## 2022-07-06 MED ORDER — LACTATED RINGERS IV SOLN: INTRAVENOUS | Status: DC

## 2022-07-06 MED ORDER — ACETAMINOPHEN 325 MG PO TABS: 650.0000 mg | ORAL_TABLET | Freq: Four times a day (QID) | ORAL | Status: DC | PRN

## 2022-07-06 MED ORDER — PIPERACILLIN-TAZOBACTAM 3.375 G IVPB 30 MIN
3.3750 g | Freq: Once | INTRAVENOUS | Status: AC
  Administered 2022-07-06: 3.375 g via INTRAVENOUS
  Filled 2022-07-06: qty 50

## 2022-07-06 MED ORDER — 0.9 % SODIUM CHLORIDE (POUR BTL) OPTIME
TOPICAL | Status: DC | PRN
  Administered 2022-07-06: 1000 mL

## 2022-07-06 MED ORDER — ACETAMINOPHEN 10 MG/ML IV SOLN: 1000.0000 mg | Freq: Once | INTRAVENOUS | Status: DC | PRN

## 2022-07-06 MED ORDER — CHLORHEXIDINE GLUCONATE 0.12 % MT SOLN
OROMUCOSAL | Status: AC
  Administered 2022-07-06: 15 mL via OROMUCOSAL
  Filled 2022-07-06: qty 15

## 2022-07-06 MED ORDER — PIPERACILLIN-TAZOBACTAM 3.375 G IVPB
3.3750 g | Freq: Three times a day (TID) | INTRAVENOUS | Status: DC
  Administered 2022-07-06 – 2022-07-09 (×9): 3.375 g via INTRAVENOUS
  Filled 2022-07-06 (×9): qty 50

## 2022-07-06 MED ORDER — OXYCODONE HCL 5 MG PO TABS
5.0000 mg | ORAL_TABLET | Freq: Three times a day (TID) | ORAL | Status: DC | PRN
  Administered 2022-07-06 – 2022-07-11 (×4): 5 mg via ORAL
  Filled 2022-07-06 (×5): qty 1

## 2022-07-06 MED ORDER — ONDANSETRON HCL 4 MG/2ML IJ SOLN: 4.0000 mg | Freq: Four times a day (QID) | INTRAMUSCULAR | Status: DC | PRN

## 2022-07-06 MED ORDER — ORAL CARE MOUTH RINSE: 15.0000 mL | Freq: Once | OROMUCOSAL | Status: AC

## 2022-07-06 MED ORDER — VANCOMYCIN HCL 1250 MG/250ML IV SOLN
1250.0000 mg | Freq: Two times a day (BID) | INTRAVENOUS | Status: DC
  Administered 2022-07-06 – 2022-07-07 (×3): 1250 mg via INTRAVENOUS
  Filled 2022-07-06 (×4): qty 250

## 2022-07-06 MED ORDER — PHENYLEPHRINE 80 MCG/ML (10ML) SYRINGE FOR IV PUSH (FOR BLOOD PRESSURE SUPPORT)
PREFILLED_SYRINGE | INTRAVENOUS | Status: DC | PRN
  Administered 2022-07-06: 160 ug via INTRAVENOUS
  Administered 2022-07-06: 80 ug via INTRAVENOUS
  Administered 2022-07-06: 160 ug via INTRAVENOUS
  Administered 2022-07-06: 240 ug via INTRAVENOUS

## 2022-07-06 MED ORDER — DOCUSATE SODIUM 100 MG PO CAPS
100.0000 mg | ORAL_CAPSULE | Freq: Two times a day (BID) | ORAL | Status: DC
  Administered 2022-07-06 – 2022-07-12 (×13): 100 mg via ORAL
  Filled 2022-07-06 (×13): qty 1

## 2022-07-06 MED ORDER — INSULIN ASPART 100 UNIT/ML IJ SOLN: 0.0000 [IU] | INTRAMUSCULAR | Status: DC | PRN

## 2022-07-06 MED ORDER — ACETAMINOPHEN 160 MG/5ML PO SOLN: 325.0000 mg | ORAL | Status: DC | PRN

## 2022-07-06 MED ORDER — HYDRALAZINE HCL 20 MG/ML IJ SOLN: 5.0000 mg | INTRAMUSCULAR | Status: DC | PRN

## 2022-07-06 MED ORDER — ONDANSETRON HCL 4 MG/2ML IJ SOLN
INTRAMUSCULAR | Status: DC | PRN
  Administered 2022-07-06: 4 mg via INTRAVENOUS

## 2022-07-06 MED ORDER — MIDAZOLAM HCL 5 MG/5ML IJ SOLN
INTRAMUSCULAR | Status: DC | PRN
  Administered 2022-07-06: 2 mg via INTRAVENOUS

## 2022-07-06 MED ORDER — BISACODYL 5 MG PO TBEC: 5.0000 mg | DELAYED_RELEASE_TABLET | Freq: Every day | ORAL | Status: DC | PRN

## 2022-07-06 MED ORDER — LISINOPRIL 10 MG PO TABS
10.0000 mg | ORAL_TABLET | Freq: Every day | ORAL | Status: DC
  Administered 2022-07-06 – 2022-07-07 (×2): 10 mg via ORAL
  Filled 2022-07-06 (×2): qty 1

## 2022-07-06 MED ORDER — OXYCODONE HCL 5 MG/5ML PO SOLN: 5.0000 mg | Freq: Once | ORAL | Status: DC | PRN

## 2022-07-06 MED ORDER — VANCOMYCIN HCL IN DEXTROSE 1-5 GM/200ML-% IV SOLN
1000.0000 mg | Freq: Once | INTRAVENOUS | Status: AC
  Administered 2022-07-06: 1000 mg via INTRAVENOUS
  Filled 2022-07-06: qty 200

## 2022-07-06 MED ORDER — OXYCODONE HCL 5 MG PO TABS: 5.0000 mg | ORAL_TABLET | Freq: Once | ORAL | Status: DC | PRN

## 2022-07-06 MED ORDER — PHENYLEPHRINE 80 MCG/ML (10ML) SYRINGE FOR IV PUSH (FOR BLOOD PRESSURE SUPPORT)
PREFILLED_SYRINGE | INTRAVENOUS | Status: AC
  Filled 2022-07-06: qty 10

## 2022-07-06 MED ORDER — AMISULPRIDE (ANTIEMETIC) 5 MG/2ML IV SOLN: 10.0000 mg | Freq: Once | INTRAVENOUS | Status: DC | PRN

## 2022-07-06 MED ORDER — GABAPENTIN 300 MG PO CAPS
600.0000 mg | ORAL_CAPSULE | Freq: Three times a day (TID) | ORAL | Status: DC
  Administered 2022-07-06 – 2022-07-12 (×19): 600 mg via ORAL
  Filled 2022-07-06 (×19): qty 2

## 2022-07-06 MED ORDER — BACLOFEN 10 MG PO TABS: 10.0000 mg | ORAL_TABLET | Freq: Three times a day (TID) | ORAL | Status: DC | PRN

## 2022-07-06 MED ORDER — FENTANYL CITRATE (PF) 100 MCG/2ML IJ SOLN: 25.0000 ug | INTRAMUSCULAR | Status: DC | PRN

## 2022-07-06 MED ORDER — FENTANYL CITRATE (PF) 100 MCG/2ML IJ SOLN
INTRAMUSCULAR | Status: DC | PRN
  Administered 2022-07-06: 75 ug via INTRAVENOUS
  Administered 2022-07-06 (×2): 25 ug via INTRAVENOUS

## 2022-07-06 MED ORDER — FLUOXETINE HCL 20 MG PO CAPS
20.0000 mg | ORAL_CAPSULE | Freq: Every day | ORAL | Status: DC
  Administered 2022-07-06 – 2022-07-12 (×7): 20 mg via ORAL
  Filled 2022-07-06 (×7): qty 1

## 2022-07-06 MED ORDER — PHENYLEPHRINE HCL-NACL 20-0.9 MG/250ML-% IV SOLN
INTRAVENOUS | Status: DC | PRN
  Administered 2022-07-06: 40 ug/min via INTRAVENOUS

## 2022-07-06 MED ORDER — ACETAMINOPHEN 650 MG RE SUPP: 650.0000 mg | Freq: Four times a day (QID) | RECTAL | Status: DC | PRN

## 2022-07-06 MED ORDER — DEXAMETHASONE SODIUM PHOSPHATE 10 MG/ML IJ SOLN
INTRAMUSCULAR | Status: DC | PRN
  Administered 2022-07-06: 5 mg via INTRAVENOUS
  Administered 2022-07-06: 60 mg via INTRAVENOUS

## 2022-07-06 MED ORDER — MIDAZOLAM HCL 2 MG/2ML IJ SOLN
INTRAMUSCULAR | Status: AC
  Filled 2022-07-06: qty 2

## 2022-07-06 MED ORDER — LIDOCAINE 2% (20 MG/ML) 5 ML SYRINGE
INTRAMUSCULAR | Status: DC | PRN
  Administered 2022-07-06: 60 mg via INTRAVENOUS

## 2022-07-06 MED ORDER — FENTANYL CITRATE (PF) 250 MCG/5ML IJ SOLN
INTRAMUSCULAR | Status: AC
  Filled 2022-07-06: qty 5

## 2022-07-06 MED ORDER — PROPOFOL 10 MG/ML IV BOLUS
INTRAVENOUS | Status: DC | PRN
  Administered 2022-07-06: 130 mg via INTRAVENOUS

## 2022-07-06 MED ORDER — LIDOCAINE 2% (20 MG/ML) 5 ML SYRINGE
INTRAMUSCULAR | Status: AC
  Filled 2022-07-06: qty 5

## 2022-07-06 MED ORDER — SUGAMMADEX SODIUM 200 MG/2ML IV SOLN
INTRAVENOUS | Status: DC | PRN
  Administered 2022-07-06: 200 mg via INTRAVENOUS

## 2022-07-06 MED ORDER — ROCURONIUM BROMIDE 10 MG/ML (PF) SYRINGE
PREFILLED_SYRINGE | INTRAVENOUS | Status: DC | PRN
  Administered 2022-07-06: 60 mg via INTRAVENOUS

## 2022-07-06 MED ORDER — ONDANSETRON HCL 4 MG PO TABS: 4.0000 mg | ORAL_TABLET | Freq: Four times a day (QID) | ORAL | Status: DC | PRN

## 2022-07-06 MED ORDER — CHLORHEXIDINE GLUCONATE 0.12 % MT SOLN: 15.0000 mL | Freq: Once | OROMUCOSAL | Status: AC

## 2022-07-06 MED ORDER — PROPOFOL 10 MG/ML IV BOLUS
INTRAVENOUS | Status: AC
  Filled 2022-07-06: qty 20

## 2022-07-06 MED ORDER — ACETAMINOPHEN 325 MG PO TABS: 325.0000 mg | ORAL_TABLET | ORAL | Status: DC | PRN

## 2022-07-06 MED ORDER — ROCURONIUM BROMIDE 10 MG/ML (PF) SYRINGE
PREFILLED_SYRINGE | INTRAVENOUS | Status: AC
  Filled 2022-07-06: qty 10

## 2022-07-06 SURGICAL SUPPLY — 29 items
BAG COUNTER SPONGE SURGICOUNT (BAG) ×1 IMPLANT
BAG SPNG CNTER NS LX DISP (BAG) ×1
BNDG GAUZE DERMACEA FLUFF 4 (GAUZE/BANDAGES/DRESSINGS) IMPLANT
BNDG GZE DERMACEA 4 6PLY (GAUZE/BANDAGES/DRESSINGS) ×1
CANISTER SUCT 3000ML PPV (MISCELLANEOUS) ×1 IMPLANT
COVER SURGICAL LIGHT HANDLE (MISCELLANEOUS) ×1 IMPLANT
DRAPE LAPAROSCOPIC ABDOMINAL (DRAPES) ×1 IMPLANT
DRAPE UTILITY XL STRL (DRAPES) ×1 IMPLANT
ELECT CAUTERY BLADE 6.4 (BLADE) ×1 IMPLANT
ELECT REM PT RETURN 9FT ADLT (ELECTROSURGICAL) ×1
ELECTRODE REM PT RTRN 9FT ADLT (ELECTROSURGICAL) ×1 IMPLANT
GAUZE PAD ABD 8X10 STRL (GAUZE/BANDAGES/DRESSINGS) IMPLANT
GAUZE SPONGE 4X4 12PLY STRL (GAUZE/BANDAGES/DRESSINGS) IMPLANT
GLOVE BIO SURGEON STRL SZ8 (GLOVE) ×1 IMPLANT
GLOVE BIOGEL PI IND STRL 8 (GLOVE) ×1 IMPLANT
GOWN STRL REUS W/ TWL LRG LVL3 (GOWN DISPOSABLE) ×1 IMPLANT
GOWN STRL REUS W/ TWL XL LVL3 (GOWN DISPOSABLE) ×1 IMPLANT
GOWN STRL REUS W/TWL LRG LVL3 (GOWN DISPOSABLE) ×1
GOWN STRL REUS W/TWL XL LVL3 (GOWN DISPOSABLE) ×1
KIT BASIN OR (CUSTOM PROCEDURE TRAY) ×1 IMPLANT
KIT TURNOVER KIT B (KITS) ×1 IMPLANT
NS IRRIG 1000ML POUR BTL (IV SOLUTION) ×1 IMPLANT
PACK GENERAL/GYN (CUSTOM PROCEDURE TRAY) ×1 IMPLANT
PAD ARMBOARD 7.5X6 YLW CONV (MISCELLANEOUS) ×1 IMPLANT
PENCIL SMOKE EVACUATOR (MISCELLANEOUS) ×1 IMPLANT
SWAB COLLECTION DEVICE MRSA (MISCELLANEOUS) IMPLANT
SWAB CULTURE ESWAB REG 1ML (MISCELLANEOUS) IMPLANT
TOWEL GREEN STERILE (TOWEL DISPOSABLE) ×1 IMPLANT
TOWEL GREEN STERILE FF (TOWEL DISPOSABLE) ×1 IMPLANT

## 2022-07-06 NOTE — Assessment & Plan Note (Signed)
Pt meets sepsis criteria due to HR/RR and source of infection.  We will cont iv abx and follow cultures.

## 2022-07-06 NOTE — Progress Notes (Signed)
Pt arrive to unit @0418  accompanied by ED staff.

## 2022-07-06 NOTE — Progress Notes (Signed)
Pharmacy Antibiotic Note  [name] is a 57 y.o. male admitted on 07/05/2022 with sacral wound.  Pharmacy  consulted for vancomycin dosing. Planning to debride wound during this admission.   Now pharmacy consulted to dose Zosyn for wound infection.  Patient received Zosyn 3.375 g IV x1 on 5/25 @ 0100.     Plan: Zosyn 3.375 g IV q8 hr (extended infusion over 4 hour) Continue Vancomycin 1250mg  IV q 12hr (eAUC 457) Monitor clinical progress, renal function, cultures  Check steady state vancomycin levels per protocol if needed.    Height: 5\' 8"  (172.7 cm) Weight: 127 kg (280 lb) IBW/kg (Calculated) : 68.4  Temp (24hrs), Avg:98.6 F (37 C), Min:97.7 F (36.5 C), Max:99.9 F (37.7 C)  Recent Labs  Lab 07/05/22 2100 07/06/22 0346  WBC 8.9 8.6  CREATININE 0.83 0.67  LATICACIDVEN 1.1  --      Estimated Creatinine Clearance: 137.1 mL/min (by C-G formula based on SCr of 0.67 mg/dL).    No Known Allergies    Thank you for allowing pharmacy to be a part of this patient's care.  Noah Delaine, RPh Clinical Pharmacist  07/06/2022 9:13 AM Please check AMION for all [facility] phone numbers After 10:00 PM, call Main Pharmacy [phone]

## 2022-07-06 NOTE — Anesthesia Preprocedure Evaluation (Addendum)
Anesthesia Evaluation  Patient identified by MRN, date of birth, ID band Patient awake    Reviewed: Allergy & Precautions, NPO status , Patient's Chart, lab work & pertinent test results  Airway Mallampati: IV  TM Distance: >3 FB Neck ROM: Full    Dental  (+) Teeth Intact, Poor Dentition   Pulmonary neg pulmonary ROS   breath sounds clear to auscultation       Cardiovascular hypertension, Pt. on medications  Rhythm:Regular Rate:Normal     Neuro/Psych negative neurological ROS  negative psych ROS   GI/Hepatic negative GI ROS, Neg liver ROS,,,  Endo/Other  diabetes, Type 2, Oral Hypoglycemic Agents, Insulin Dependent    Renal/GU negative Renal ROS     Musculoskeletal   Abdominal   Peds  Hematology   Anesthesia Other Findings   Reproductive/Obstetrics                             Anesthesia Physical Anesthesia Plan  ASA: 3  Anesthesia Plan: General   Post-op Pain Management: Tylenol PO (pre-op)*, Toradol IV (intra-op)* and Gabapentin PO (pre-op)*   Induction: Intravenous  PONV Risk Score and Plan: 3 and Ondansetron, Dexamethasone and Midazolam  Airway Management Planned: Oral ETT  Additional Equipment: None  Intra-op Plan:   Post-operative Plan: Extubation in OR  Informed Consent: I have reviewed the patients History and Physical, chart, labs and discussed the procedure including the risks, benefits and alternatives for the proposed anesthesia with the patient or authorized representative who has indicated his/her understanding and acceptance.     Dental advisory given  Plan Discussed with: CRNA  Anesthesia Plan Comments: (Possible glidescope)       Anesthesia Quick Evaluation

## 2022-07-06 NOTE — Assessment & Plan Note (Signed)
Patient also at high risk for DVT and PE. Currently Eliquis on hold due to procedure.

## 2022-07-06 NOTE — Progress Notes (Signed)
I seen and assessed patient and agree with Dr. Eliane Decree assessment and plan.  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  CT abdomen and pelvis done with left medial gluteal cleft subcutaneous soft tissue emphysema, overlying mild dermal thickening and subcutaneous soft tissue fat stranding, no organized abscess identified findings suggestive of infection.  Necrotizing fasciitis cannot be excluded.  Patient assessed by general surgery and patient underwent I&D of sacral wound with necrotic purulent tissue noted.  Continue IV vancomycin and IV Zosyn.  Will need ID input for antibiotic duration and recommendations.  Supportive care.  No charge.

## 2022-07-06 NOTE — Assessment & Plan Note (Signed)
Patient started on Zosyn and vancomycin in the emergency room. General surgery consulted. Pharmacy consult for medication management.

## 2022-07-06 NOTE — Transfer of Care (Signed)
Immediate Anesthesia Transfer of Care Note  Patient: Kordai Skemp  Procedure(s) Performed: IRRIGATION AND DEBRIDEMENT SACRAL WOUND (Buttocks)  Patient Location: PACU  Anesthesia Type:General  Level of Consciousness: awake, alert , oriented, and patient cooperative  Airway & Oxygen Therapy: Patient Spontanous Breathing and Patient connected to face mask oxygen  Post-op Assessment: Report given to RN and Post -op Vital signs reviewed and stable  Post vital signs: Reviewed and stable  Last Vitals:  Vitals Value Taken Time  BP 108/73 07/06/22 1331  Temp    Pulse 82 07/06/22 1337  Resp 9 07/06/22 1337  SpO2 100 % 07/06/22 1337  Vitals shown include unvalidated device data.  Last Pain:  Vitals:   07/06/22 1143  TempSrc: Oral  PainSc:          Complications: No notable events documented.

## 2022-07-06 NOTE — Progress Notes (Signed)
Pharmacy Antibiotic Note  [name] is a 57 y.o. male admitted on 07/05/2022 with sacral wound.  Pharmacy has been consulted for vancomycin dosing. Planning to debride wound during this admission.   Plan: Vancomcyin 1250mg  IV BID (eAUC 457)  Height: 5\' 8"  (172.7 cm) Weight: 127 kg (280 lb) IBW/kg (Calculated) : 68.4  Temp (24hrs), Avg:99 F (37.2 C), Min:98.3 F (36.8 C), Max:99.9 F (37.7 C)  Recent Labs  Lab 07/05/22 2100  WBC 8.9  CREATININE 0.83  LATICACIDVEN 1.1    Estimated Creatinine Clearance: 132.1 mL/min (by C-G formula based on SCr of 0.83 mg/dL).    No Known Allergies    Thank you for allowing pharmacy to be a part of this patient's care.  [name] 07/06/2022 3:07 AM

## 2022-07-06 NOTE — Progress Notes (Signed)
Day of Surgery    Subjective: In pre-op ROS negative except as listed above. Objective: Vital signs in last 24 hours: Temp:  [97.7 F (36.5 C)-99.9 F (37.7 C)] 97.7 F (36.5 C) (05/25 0756) Pulse Rate:  [72-91] 78 (05/25 0756) Resp:  [9-23] 11 (05/25 0450) BP: (94-163)/(44-80) 109/66 (05/25 0756) SpO2:  [97 %-100 %] 99 % (05/25 0756) Weight:  [161 kg] 127 kg (05/24 2047) Last BM Date : 07/04/22  Intake/Output from previous day: 05/24 0701 - 05/25 0700 In: 824.5 [I.V.:405.8; IV Piggyback:418.8] Out: 1225 [Urine:1225] Intake/Output this shift: Total I/O In: -  Out: 1400 [Urine:1400]  General appearance: alert and cooperative Resp: clear to auscultation bilaterally Sacral wound dressed  Lab Results: CBC  Recent Labs    07/05/22 2100 07/06/22 0346  WBC 8.9 8.6  HGB 10.3* 10.0*  HCT 31.8* 31.0*  PLT 304 285   BMET Recent Labs    07/05/22 2100 07/06/22 0346  NA 134* 136  K 3.9 3.6  CL 101 105  CO2 23 22  GLUCOSE 145* 149*  BUN 11 9  CREATININE 0.83 0.67  CALCIUM 8.5* 8.3*   PT/INR Recent Labs    07/06/22 0146  LABPROT 19.0*  INR 1.6*   ABG No results for input(s): "PHART", "HCO3" in the last 72 hours.  Invalid input(s): "PCO2", "PO2"  Studies/Results: CT ABDOMEN PELVIS W CONTRAST  Result Date: 07/06/2022 CLINICAL DATA:  Abdominal pain, acute, nonlocalized buttucks cellulitis and concern for abscess EXAM: CT ABDOMEN AND PELVIS WITH CONTRAST TECHNIQUE: Multidetector CT imaging of the abdomen and pelvis was performed using the standard protocol following bolus administration of intravenous contrast. RADIATION DOSE REDUCTION: This exam was performed according to the departmental dose-optimization program which includes automated exposure control, adjustment of the mA and/or kV according to patient size and/or use of iterative reconstruction technique. CONTRAST:  OMNIPAQUE IOHEXOL 300 MG/ML  SOLN COMPARISON:  None Available. FINDINGS: Lower chest: No  acute abnormality. Hepatobiliary: No focal liver abnormality. No gallstones, gallbladder wall thickening, or pericholecystic fluid. No biliary dilatation. Pancreas: No focal lesion. Normal pancreatic contour. No surrounding inflammatory changes. No main pancreatic ductal dilatation. Spleen: Normal in size without focal abnormality.  Splenule noted. Adrenals/Urinary Tract: No adrenal nodule bilaterally. Bilateral kidneys enhance symmetrically. Left nephrolithiasis measuring up to 2.9 cm. Associated mild fullness of the left collecting system with peripelvic fat stranding. No frank hydroureteronephrosis. Mild left urothelial thickening. No left ureterolithiasis. No right nephroureterolithiasis. No right hydronephrosis. The urinary bladder is decompressed with Foley catheter tip and balloon terminating within the lumen. On delayed imaging, there is no urothelial wall thickening and there are no filling defects in the opacified portions of the bilateral collecting systems or ureters. Stomach/Bowel: Stomach is within normal limits. No evidence of bowel wall thickening or dilatation. Few scattered colonic diverticula. Appendix appears normal. Vascular/Lymphatic: Inferior vena cava filter in grossly appropriate position. No abdominal aorta or iliac aneurysm. Mild atherosclerotic plaque of the aorta and its branches. Borderline enlarged bilateral pelvis sidewall lymph nodes. Otherwise no abdominal, pelvic, or inguinal lymphadenopathy. Reproductive: Prostate is unremarkable. Other: No intraperitoneal free fluid. No intraperitoneal free gas. No organized fluid collection. Musculoskeletal: Left medial gluteal cleft subcutaneus soft tissue emphysema. Associated overlying mild dermal thickening and subcutaneus soft tissue fat stranding. No organized fluid collection. Atrophic musculature. No suspicious lytic or blastic osseous lesions. No acute displaced fracture. Multilevel degenerative changes of the spine. IMPRESSION: 1. Left  medial gluteal cleft subcutaneus soft tissue emphysema. Associated overlying mild dermal thickening and subcutaneus  soft tissue fat stranding. No organized abscess identified. Findings suggestive of infection. A necrotizing fasciitis cannot be excluded as this is a clinical diagnosis. 2. Nonobstructive left nephrolithiasis measuring up to 2.9 cm with question superimposed left collecting system infection. Correlate with urinalysis. 3. Colonic diverticulosis with no acute diverticulitis. 4. Inferior vena cava filter in appropriate position. 5. Aortic Atherosclerosis (ICD10-I70.0). 6. Borderline enlarged bilateral pelvis sidewall lymph nodes. Recommend attention on follow-up. Electronically Signed   By: Tish Frederickson M.D.   On: 07/06/2022 00:24   DG Chest 2 View  Result Date: 07/05/2022 CLINICAL DATA:  Fever and chills x1 week. EXAM: CHEST - 2 VIEW COMPARISON:  January 04, 2022 FINDINGS: The heart size and mediastinal contours are within normal limits. Both lungs are clear. Stable postoperative changes are seen within the lower cervical and mid to upper thoracic spine. The visualized skeletal structures are unremarkable. IMPRESSION: No active cardiopulmonary disease. Electronically Signed   By: Aram Candela M.D.   On: 07/05/2022 22:31    Anti-infectives: Anti-infectives (From admission, onward)    Start     Dose/Rate Route Frequency Ordered Stop   07/06/22 1000  [MAR Hold]  piperacillin-tazobactam (ZOSYN) IVPB 3.375 g        (MAR Hold since Sat 07/06/2022 at 1122.Hold Reason: Transfer to a Procedural area)   3.375 g 12.5 mL/hr over 240 Minutes Intravenous Every 8 hours 07/06/22 0913     07/06/22 0400  [MAR Hold]  vancomycin (VANCOREADY) IVPB 1250 mg/250 mL        (MAR Hold since Sat 07/06/2022 at 1122.Hold Reason: Transfer to a Procedural area)   1,250 mg 166.7 mL/hr over 90 Minutes Intravenous 2 times daily 07/06/22 0305     07/06/22 0100  vancomycin (VANCOCIN) IVPB 1000 mg/200 mL premix         1,000 mg 200 mL/hr over 60 Minutes Intravenous  Once 07/06/22 0050 07/06/22 0402   07/06/22 0100  piperacillin-tazobactam (ZOSYN) IVPB 3.375 g        3.375 g 100 mL/hr over 30 Minutes Intravenous  Once 07/06/22 0050 07/06/22 0212   07/05/22 2300  cefTRIAXone (ROCEPHIN) 1 g in sodium chloride 0.9 % 100 mL IVPB        1 g 200 mL/hr over 30 Minutes Intravenous  Once 07/05/22 2248 07/06/22 0139       Assessment/Plan: Infected sacral wound -IV vancomycin and Zosyn, will proceed to the operating room for debridement.  I discussed the procedure, risks, and benefits with him in detail.  I answered his questions.  I also discussed the added risks of bleeding due to his anticoagulated state.  I think we need to proceed urgently due to the infection.  He is agreeable.  I discussed the expected postoperative course.   LOS: 0 days    Violeta Gelinas, MD, MPH, FACS Trauma & General Surgery Use [email or web address] to contact on call provider  5/25/2024Patient ID: [name], male   DOB: [birth year], 57 y.o.   MRN: [id number]

## 2022-07-06 NOTE — Assessment & Plan Note (Signed)
Home regimen consist of Januvia, metformin, Lantus, glipizide. Most recent A1c was 11 months ago and was 7.3. Currently we will monitor patient on sliding scale insulin regimen.

## 2022-07-06 NOTE — H&P (Signed)
History and Physical     Patient: [name] ZOX:[id number] DOB: September 11, 1965 DOA: 07/05/2022 DOS: the patient was seen and examined on 07/06/2022 PCP: System, Provider Not In   Patient coming from:  Home  Chief Complaint: Fever HISTORY OF PRESENT ILLNESS: [name] is an 57 y.o. male seen in the emergency room today for fever.  Patient was seen on the fourth for sacral decub ulcer that has been getting worse.  Patient reports chills as well and lethargy over the past few days with decreased appetite and p.o. intake also reports of nausea.  No other reports or symptoms of chest pain palpitation bleeding seizures urinary problems rashes.  Also reports shortness of breath.  With a worsening wound. Chart review shows patient was seen on 4 May for the same sacral ulcer.  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  Has past medical history of diabetes mellitus type 2.  At that time patient did not have any fevers reported.  No fluctuance fever erythema or drainage heart provider note.  Clinically patient had no leukocytosis.  Patient was discharged with follow-up with primary care instructions to keep the wound clean and dry with Mepilex dressing.  Patient has not had any sacral wounds before but has had some pressure ulcers in the past.  General surgery saw patient in the emergency room recommended hospitalist admit patient and hold Eliquis and admission for wound debridement.  Past Medical History:  Diagnosis Date   Diabetes mellitus without complication (HCC)    History of cervical fracture    Review of Systems  Constitutional:  Positive for chills, fatigue and fever.  Skin:  Positive for color change and wound.  All other systems reviewed and are negative.  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;  Surgeon: [name], MD;  Location: [facility] OR;  Service: Orthopedics;  Laterality: Left;   CERVICAL SPINE SURGERY      Current Facility-Administered Medications:    lactated ringers infusion, , Intravenous, Continuous, Gailen Shelter, [place], Last Rate: 125 mL/hr at 07/06/22 0216, New Bag at 07/06/22 0216   lidocaine (XYLOCAINE) 1 % (with pres) injection 5 mL, 5 mL, Other, Once, Lovorn, Megan, MD   vancomycin (VANCOCIN) IVPB 1000 mg/200 mL premix, 1,000 mg, Intravenous, Once, Fondaw, Wylder S, PA, Last Rate: 200 mL/hr at 07/06/22 0216, 1,000 mg at 07/06/22 8119  Current Outpatient Medications:    acetaminophen (TYLENOL) 500 MG tablet, Take 500 mg by mouth every 6 (six) hours as needed for mild pain., Disp: , Rfl:    apixaban (ELIQUIS) 5 MG TABS tablet, Take 1 tablet by mouth 2 (two) times daily., Disp: , Rfl:    baclofen (LIORESAL) 10 MG tablet, TAKE 1 TABLET BY MOUTH 3 TIMES DAILY AS NEEDED FOR MUSCLE SPASMS OR tightness, Disp: 90 tablet, Rfl: 5   BISACODYL LAXATIVE RE, Place 1 suppository rectally at bedtime., Disp: , Rfl:    blood glucose meter kit and supplies, Dispense based on patient and insurance preference. Use up to four times daily as directed. (FOR ICD-10 E10.9, E11.9)., Disp: 1 each, Rfl: 0   Blood Glucose Monitoring Suppl (PRECISION XTRA) DEVI, by Does not apply route., Disp: , Rfl:    Carboxymethylcellulose Sodium (ARTIFICIAL TEARS OP),  Apply 1-2 drops to eye daily as needed (dry eyes). , Disp: , Rfl:    FLUoxetine (PROZAC) 20 MG capsule, Has 3 refills!!!!! TAKE 1 CAPSULE BY MOUTH DAILY Needs appt for refills, Disp: 90 capsule, Rfl: 3   FLUoxetine (PROZAC) 20 MG capsule, Take 1 capsule (20 mg total) by mouth daily., Disp: 90 capsule, Rfl: 3   furosemide (LASIX) 40 MG tablet, Take 1 tablet daily for 6 days, take as needed after that for swelling or weight gain, Disp: 30 tablet, Rfl: 0   gabapentin (NEURONTIN) 600 MG  tablet, TAKE 1 TABLET BY MOUTH 3 TIMES DAILY, Disp: 270 tablet, Rfl: 3   glipiZIDE (GLUCOTROL) 10 MG tablet, Take 1 tablet by mouth 2 (two) times daily., Disp: , Rfl:    glucose blood (FREESTYLE TEST STRIPS) test strip, 1 Each daily., Disp: , Rfl:    HYDROcodone-acetaminophen (NORCO) 5-325 MG tablet, Take 1 tablet by mouth 2 (two) times daily as needed for moderate pain., Disp: 30 tablet, Rfl: 0   hydrOXYzine (ATARAX/VISTARIL) 25 MG tablet, Take 1 tablet (25 mg total) by mouth 3 (three) times daily as needed for itching., Disp: 30 tablet, Rfl: 0   insulin glargine (LANTUS) 100 UNIT/ML injection, Inject into the skin., Disp: , Rfl:    Insulin Syringe-Needle U-100 (MAGELLAN INSULIN SAFETY SYR) 30G X 5/16" 1 ML MISC, 1 Each by miscellaneous route nightly at bedtime Use with injection of insulin one time at night, Disp: , Rfl:    JANUVIA 100 MG tablet, Take 100 mg by mouth daily., Disp: , Rfl:    Lancets MISC, daily., Disp: , Rfl:    lisinopril (ZESTRIL) 10 MG tablet, Take by mouth., Disp: , Rfl:    metFORMIN (GLUCOPHAGE) 1000 MG tablet, SMARTSIG:1 Tablet(s) By Mouth Morning-Evening, Disp: , Rfl:    metFORMIN (GLUCOPHAGE) 1000 MG tablet, Take 1 tablet by mouth 2 (two) times daily with a meal., Disp: , Rfl:    nitrofurantoin, macrocrystal-monohydrate, (MACROBID) 100 MG capsule, Take 1 capsule (100 mg total) by mouth 2 (two) times daily., Disp: 10 capsule, Rfl: 0   sildenafil (VIAGRA) 100 MG tablet, Take 1 tablet (100 mg total) by mouth daily as needed for erectile dysfunction., Disp: 30 tablet, Rfl: 3   sitaGLIPtin (JANUVIA) 100 MG tablet, Take 1 tablet by mouth daily., Disp: , Rfl:    sitaGLIPtin-metformin (JANUMET) 50-1000 MG tablet, Take 1 tablet by mouth 2 (two) times daily with a meal., Disp: 180 tablet, Rfl: 1    ED Course: Pt in Ed alert awake oriented afebrile soft blood pressures O2 sats of 97% on room air. Vitals:   07/06/22 0057 07/06/22 0145 07/06/22 0200 07/06/22 0215  BP: 97/80 104/60  (!) 103/52 (!) 96/44  Pulse: 85 81 82 80  Resp: 14 20 18 11   Temp: 99 F (37.2 C)     TempSrc: Oral     SpO2: 99% 97% 100% 100%  Weight:      Height:       No intake/output data recorded. SpO2: 100 % Blood work in ed shows: Hyponatremia of 134 glucose 145 normal kidney function AST of 41 ALT mildly elevated at 47 otherwise normal CMP. CRP of 15.8, lactic of 1.1. Anemia with a hemoglobin of 10.3 normal white count normal platelet count normal neutrophil percentage. In the emergency room patient giving Rocephin and vancomycin and Zosyn. General surgery consulted in the emergency room and note in chart plan for debridement tomorrow morning.  Results for orders placed or performed during the  hospital encounter of 07/05/22 (from the past 72 hour(s))  Lactic acid, plasma     Status: None   Collection Time: 07/05/22  9:00 PM  Result Value Ref Range   Lactic Acid, Venous 1.1 0.5 - 1.9 mmol/L    Comment: Performed at [facility], [address] [place], [place] [address]  Comprehensive metabolic panel     Status: Abnormal   Collection Time: 07/05/22  9:00 PM  Result Value Ref Range   Sodium 134 (L) 135 - 145 mmol/L   Potassium 3.9 3.5 - 5.1 mmol/L   Chloride 101 98 - 111 mmol/L   CO2 23 22 - 32 mmol/L   Glucose, Bld 145 (H) 70 - 99 mg/dL    Comment: Glucose reference range applies only to samples taken after fasting for at least 8 hours.   BUN 11 6 - 20 mg/dL   Creatinine, Ser 8.29 0.61 - 1.24 mg/dL   Calcium 8.5 (L) 8.9 - 10.3 mg/dL   Total Protein 7.6 6.5 - 8.1 g/dL   Albumin 2.7 (L) 3.5 - 5.0 g/dL   AST 41 15 - 41 U/L   ALT 47 (H) 0 - 44 U/L   Alkaline Phosphatase 65 38 - 126 U/L   Total Bilirubin 0.8 0.3 - 1.2 mg/dL   GFR, Estimated >56 >21 mL/min    Comment: (NOTE) Calculated using the CKD-EPI Creatinine Equation (2021)    Anion gap 10 5 - 15    Comment: Performed at [facility], [address] [place], [place] [address]  CBC with Differential     Status:  Abnormal   Collection Time: 07/05/22  9:00 PM  Result Value Ref Range   WBC 8.9 4.0 - 10.5 K/uL   RBC 3.64 (L) 4.22 - 5.81 MIL/uL   Hemoglobin 10.3 (L) 13.0 - 17.0 g/dL   HCT 78.4 (L) 69.6 - 29.5 %   MCV 87.4 80.0 - 100.0 fL   MCH 28.3 26.0 - 34.0 pg   MCHC 32.4 30.0 - 36.0 g/dL   RDW 28.4 13.2 - 44.0 %   Platelets 304 150 - 400 K/uL   nRBC 0.0 0.0 - 0.2 %   Neutrophils Relative % 74 %   Neutro Abs 6.6 1.7 - 7.7 K/uL   Lymphocytes Relative 16 %   Lymphs Abs 1.4 0.7 - 4.0 K/uL   Monocytes Relative 6 %   Monocytes Absolute 0.6 0.1 - 1.0 K/uL   Eosinophils Relative 2 %   Eosinophils Absolute 0.2 0.0 - 0.5 K/uL   Basophils Relative 1 %   Basophils Absolute 0.0 0.0 - 0.1 K/uL   Immature Granulocytes 1 %   Abs Immature Granulocytes 0.05 0.00 - 0.07 K/uL    Comment: Performed at [facility], [address], [place], [place] [address]  Urinalysis, Routine w reflex microscopic -Urine, Catheterized; Indwelling urinary catheter     Status: Abnormal   Collection Time: 07/05/22  9:55 PM  Result Value Ref Range   Color, Urine YELLOW YELLOW   APPearance CLOUDY (A) CLEAR   Specific Gravity, Urine 1.015 1.005 - 1.030   pH 8.0 5.0 - 8.0   Glucose, UA NEGATIVE NEGATIVE mg/dL   Hgb urine dipstick MODERATE (A) NEGATIVE   Bilirubin Urine SMALL (A) NEGATIVE   Ketones, ur NEGATIVE NEGATIVE mg/dL   Protein, ur 536 (A) NEGATIVE mg/dL   Nitrite NEGATIVE NEGATIVE   Leukocytes,Ua LARGE (A) NEGATIVE   RBC / HPF >50 0 - 5 RBC/hpf  WBC, UA >50 0 - 5 WBC/hpf   Bacteria, UA MANY (A) NONE SEEN   Squamous Epithelial / HPF 0-5 0 - 5 /HPF   WBC Clumps PRESENT    Mucus PRESENT     Comment: Performed at [facility], [address] [place], [place] [address]  CK     Status: None   Collection Time: 07/06/22  1:24 AM  Result Value Ref Range   Total CK 108 49 - 397 U/L    Comment: Performed at [facility], [address], [place], [place] [address]  C-reactive protein     Status:  Abnormal   Collection Time: 07/06/22  1:24 AM  Result Value Ref Range   CRP 15.8 (H) <1.0 mg/dL    Comment: Performed at [facility], [address], [place], [place] [address]  Protime-INR     Status: Abnormal   Collection Time: 07/06/22  1:46 AM  Result Value Ref Range   Prothrombin Time 19.0 (H) 11.4 - 15.2 seconds   INR 1.6 (H) 0.8 - 1.2    Comment: (NOTE) INR goal varies based on device and disease states. Performed at [facility], [address], [place], [place] [address]   APTT     Status: Abnormal   Collection Time: 07/06/22  1:46 AM  Result Value Ref Range   aPTT 46 (H) 24 - 36 seconds    Comment:        IF BASELINE aPTT IS ELEVATED, SUGGEST PATIENT RISK ASSESSMENT BE USED TO DETERMINE APPROPRIATE ANTICOAGULANT THERAPY. Performed at [facility], [name], [place], [place] [address]     Lab Results  Component Value Date   CREATININE 0.83 07/05/2022   CREATININE 0.95 06/15/2022   CREATININE 0.61 01/04/2022      Latest Ref Rng & Units 07/05/2022    9:00 PM 06/15/2022    6:56 PM 01/04/2022    8:00 AM  CMP  Glucose 70 - 99 mg/dL 130  865  784   BUN 6 - 20 mg/dL 11  16  9    Creatinine 0.61 - 1.24 mg/dL 6.96  2.95  2.84   Sodium 135 - 145 mmol/L 134  132  136   Potassium 3.5 - 5.1 mmol/L 3.9  3.7  3.1   Chloride 98 - 111 mmol/L 101  98  100   CO2 22 - 32 mmol/L 23  23  24    Calcium 8.9 - 10.3 mg/dL 8.5  8.5  8.7   Total Protein 6.5 - 8.1 g/dL 7.6  7.4  6.9   Total Bilirubin 0.3 - 1.2 mg/dL 0.8  0.5  1.0   Alkaline Phos 38 - 126 U/L 65  76  76   AST 15 - 41 U/L 41  22  14   ALT 0 - 44 U/L 47  26  22     Unresulted Labs (From admission, onward)     Start     Ordered   07/05/22 2249  Urine Culture  Once,   URGENT       Question:  Indication  Answer:  Acute gross hematuria   07/05/22 2248           Pt has received : Orders Placed This Encounter  Procedures   Urine Culture    Standing Status:   Standing    Number of  Occurrences:   1    Order Specific Question:   Indication  Answer:   Acute gross hematuria   DG Chest 2 View    Standing Status:   Standing    Number of Occurrences:   1    Order Specific Question:   Symptom/Reason for Exam    Answer:   Shortness of breath [786.05.ICD-9-CM]    Order Specific Question:   Call Results- Best Contact Number?    Answer:   SOB with Fevers   CT ABDOMEN PELVIS W CONTRAST    No oral contrast.    Standing Status:   Standing    Number of Occurrences:   1    Order Specific Question:   Does the patient have a contrast media/X-ray dye allergy?    Answer:   No    Order Specific Question:   If indicated for the ordered procedure, I authorize the administration of contrast media per Radiology protocol    Answer:   Yes    Order Specific Question:   If indicated for the ordered procedure, I authorize the administration of oral contrast media per Radiology protocol    Answer:   Yes   Comprehensive metabolic panel    Standing Status:   Standing    Number of Occurrences:   1   CBC with Differential    Standing Status:   Standing    Number of Occurrences:   1   Urinalysis, Routine w reflex microscopic -Urine, Catheterized; Indwelling urinary catheter    Standing Status:   Standing    Number of Occurrences:   1    Order Specific Question:   Specimen Source    Answer:   Urine, Catheterized [36]    Order Specific Question:   Specimen Source    Answer:   Indwelling urinary catheter   CK    Standing Status:   Standing    Number of Occurrences:   1   C-reactive protein    Standing Status:   Standing    Number of Occurrences:   1   Protime-INR    Standing Status:   Standing    Number of Occurrences:   1   APTT    Standing Status:   Standing    Number of Occurrences:   1   ED Cardiac monitoring    Standing Status:   Standing    Number of Occurrences:   1   Re-check Vital Signs    Standing Status:   Standing    Number of Occurrences:   1   Consult to general  surgery    Standing Status:   Standing    Number of Occurrences:   1    Order Specific Question:   Place call to:    Answer:   oncall General Surgery    Order Specific Question:   Reason for Consult    Answer:   Consult   Consult for Unassigned Medical Admission    Standing Status:   Standing    Number of Occurrences:   1    Order Specific Question:   Place call to:    Answer:   on call for unassigned    Order Specific Question:   Reason for Consult    Answer:   Admit   ED EKG    Standing Status:   Standing    Number of Occurrences:   1    Order Specific Question:   Reason for Exam    Answer:   Shortness of breath   EKG 12-Lead    Standing Status:  Standing    Number of Occurrences:   1    Meds ordered this encounter  Medications   metoCLOPramide (REGLAN) injection 10 mg   diphenhydrAMINE (BENADRYL) capsule 25 mg   acetaminophen (TYLENOL) tablet 1,000 mg   sodium chloride 0.9 % bolus 1,000 mL   cefTRIAXone (ROCEPHIN) 1 g in sodium chloride 0.9 % 100 mL IVPB    Order Specific Question:   Antibiotic Indication:    Answer:   UTI   iohexol (OMNIPAQUE) 300 MG/ML solution 100 mL   vancomycin (VANCOCIN) IVPB 1000 mg/200 mL premix    Order Specific Question:   Antibiotic Indication:    Answer:   Cellulitis   piperacillin-tazobactam (ZOSYN) IVPB 3.375 g    Order Specific Question:   Antibiotic Indication:    Answer:   Cellulitis   lactated ringers infusion    Admission Imaging : CT ABDOMEN PELVIS W CONTRAST  Result Date: 07/06/2022 CLINICAL DATA:  Abdominal pain, acute, nonlocalized buttucks cellulitis and concern for abscess EXAM: CT ABDOMEN AND PELVIS WITH CONTRAST TECHNIQUE: Multidetector CT imaging of the abdomen and pelvis was performed using the standard protocol following bolus administration of intravenous contrast. RADIATION DOSE REDUCTION: This exam was performed according to the departmental dose-optimization program which includes automated exposure control, adjustment  of the mA and/or kV according to patient size and/or use of iterative reconstruction technique. CONTRAST:  OMNIPAQUE IOHEXOL 300 MG/ML  SOLN COMPARISON:  None Available. FINDINGS: Lower chest: No acute abnormality. Hepatobiliary: No focal liver abnormality. No gallstones, gallbladder wall thickening, or pericholecystic fluid. No biliary dilatation. Pancreas: No focal lesion. Normal pancreatic contour. No surrounding inflammatory changes. No main pancreatic ductal dilatation. Spleen: Normal in size without focal abnormality.  Splenule noted. Adrenals/Urinary Tract: No adrenal nodule bilaterally. Bilateral kidneys enhance symmetrically. Left nephrolithiasis measuring up to 2.9 cm. Associated mild fullness of the left collecting system with peripelvic fat stranding. No frank hydroureteronephrosis. Mild left urothelial thickening. No left ureterolithiasis. No right nephroureterolithiasis. No right hydronephrosis. The urinary bladder is decompressed with Foley catheter tip and balloon terminating within the lumen. On delayed imaging, there is no urothelial wall thickening and there are no filling defects in the opacified portions of the bilateral collecting systems or ureters. Stomach/Bowel: Stomach is within normal limits. No evidence of bowel wall thickening or dilatation. Few scattered colonic diverticula. Appendix appears normal. Vascular/Lymphatic: Inferior vena cava filter in grossly appropriate position. No abdominal aorta or iliac aneurysm. Mild atherosclerotic plaque of the aorta and its branches. Borderline enlarged bilateral pelvis sidewall lymph nodes. Otherwise no abdominal, pelvic, or inguinal lymphadenopathy. Reproductive: Prostate is unremarkable. Other: No intraperitoneal free fluid. No intraperitoneal free gas. No organized fluid collection. Musculoskeletal: Left medial gluteal cleft subcutaneus soft tissue emphysema. Associated overlying mild dermal thickening and subcutaneus soft tissue fat  stranding. No organized fluid collection. Atrophic musculature. No suspicious lytic or blastic osseous lesions. No acute displaced fracture. Multilevel degenerative changes of the spine. IMPRESSION: 1. Left medial gluteal cleft subcutaneus soft tissue emphysema. Associated overlying mild dermal thickening and subcutaneus soft tissue fat stranding. No organized abscess identified. Findings suggestive of infection. A necrotizing fasciitis cannot be excluded as this is a clinical diagnosis. 2. Nonobstructive left nephrolithiasis measuring up to 2.9 cm with question superimposed left collecting system infection. Correlate with urinalysis. 3. Colonic diverticulosis with no acute diverticulitis. 4. Inferior vena cava filter in appropriate position. 5. Aortic Atherosclerosis (ICD10-I70.0). 6. Borderline enlarged bilateral pelvis sidewall lymph nodes. Recommend attention on follow-up. Electronically Signed   By: Blanchie Serve  Tessie Fass M.D.   On: 07/06/2022 00:24   DG Chest 2 View  Result Date: 07/05/2022 CLINICAL DATA:  Fever and chills x1 week. EXAM: CHEST - 2 VIEW COMPARISON:  January 04, 2022 FINDINGS: The heart size and mediastinal contours are within normal limits. Both lungs are clear. Stable postoperative changes are seen within the lower cervical and mid to upper thoracic spine. The visualized skeletal structures are unremarkable. IMPRESSION: No active cardiopulmonary disease. Electronically Signed   By: Aram Candela M.D.   On: 07/05/2022 22:31    Physical Examination: Vitals:   07/06/22 0057 07/06/22 0145 07/06/22 0200 07/06/22 0215  BP: 97/80 104/60 (!) 103/52 (!) 96/44  Pulse: 85 81 82 80  Temp: 99 F (37.2 C)     Resp: 14 20 18 11   Height:      Weight:      SpO2: 99% 97% 100% 100%  TempSrc: Oral     BMI (Calculated):       Physical Exam Vitals and nursing note reviewed.  Constitutional:      General: He is not in acute distress. HENT:     Head: Normocephalic and atraumatic.     Right  Ear: Hearing normal.     Left Ear: Hearing normal.     Nose: Nose normal. No nasal deformity.     Mouth/Throat:     Lips: Pink.     Tongue: No lesions.     Pharynx: Oropharynx is clear.  Eyes:     General: Lids are normal.     Extraocular Movements: Extraocular movements intact.  Cardiovascular:     Rate and Rhythm: Normal rate and regular rhythm.     Heart sounds: Normal heart sounds.  Pulmonary:     Effort: Pulmonary effort is normal.     Breath sounds: Normal breath sounds.  Abdominal:     General: Bowel sounds are normal. There is no distension.     Palpations: Abdomen is soft. There is no mass.     Tenderness: There is no abdominal tenderness.  Musculoskeletal:     Right lower leg: No edema.     Left lower leg: No edema.  Skin:    General: Skin is warm.  Neurological:     General: No focal deficit present.     Mental Status: He is alert and oriented to person, place, and time.     Cranial Nerves: Cranial nerves 2-12 are intact.  Psychiatric:        Attention and Perception: Attention normal.        Mood and Affect: Mood normal.        Speech: Speech normal.        Behavior: Behavior normal. Behavior is cooperative.     Assessment and Plan: * Cellulitis, gluteal, left Patient started on Zosyn and vancomycin in the emergency room. General surgery consulted. Pharmacy consult for medication management.  Hypertension Vitals:   07/05/22 2049 07/05/22 2144 07/05/22 2145 07/05/22 2230  BP: (!) 96/57 (!) 147/74 135/74 (!) 163/73   07/05/22 2300 07/06/22 0035 07/06/22 0057 07/06/22 0145  BP: 118/67 94/73 97/80  104/60   07/06/22 0200 07/06/22 0215  BP: (!) 103/52 (!) 96/44  Will start patient on LR at 50 cc/h. Will hold patient's Lasix, lisinopril.    Acute cystitis with hematuria Urinalysis    Component Value Date/Time   COLORURINE YELLOW 07/05/2022 2155   APPEARANCEUR CLOUDY (A) 07/05/2022 2155   LABSPEC 1.015 07/05/2022 2155   PHURINE 8.0 07/05/2022 2155  GLUCOSEU NEGATIVE 07/05/2022 2155   HGBUR MODERATE (A) 07/05/2022 2155   BILIRUBINUR SMALL (A) 07/05/2022 2155   KETONESUR NEGATIVE 07/05/2022 2155   PROTEINUR 100 (A) 07/05/2022 2155   NITRITE NEGATIVE 07/05/2022 2155   LEUKOCYTESUR LARGE (A) 07/05/2022 2155  Currently continue patient on Zosyn he has a history of ESBL UTI as well.     History of DVT (deep vein thrombosis) Patient on Eliquis which is currently held.   DM2 (diabetes mellitus, type 2) (HCC) Home regimen consist of Januvia, metformin, Lantus, glipizide. Most recent A1c was 11 months ago and was 7.3. Currently we will monitor patient on sliding scale insulin regimen.   Sepsis due to cellulitis (HCC) Pt meets sepsis criteria due to HR/RR and source of infection.  We will cont iv abx and follow cultures.  Presence of IVC filter Patient also at high risk for DVT and PE. Currently Eliquis on hold due to procedure.  Quadriplegia (HCC) From a trauma few years ago. 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 Patient will need aggressive skin care for prevention in the future with an DME and physical therapy and skin protection. Education of family in Spanish when stable for discharge.   Other orders -     Comprehensive metabolic panel; Standing -     CBC with Differential/Platelet; Standing -     Urinalysis, Routine w reflex microscopic; Standing -     DG Chest 2 View; Standing -     ED Cardiac monitoring; Standing -     ED EKG; Standing -     EKG 12-Lead; Standing -     Metoclopramide HCl -     diphenhydrAMINE HCl -     Acetaminophen -     sodium chloride -     cefTRIAXone (ROCEPHIN) 1 g in sodium chloride 0.9 % 100 mL IVPB -     CT ABDOMEN PELVIS W CONTRAST; Standing -     Urine Culture; Standing -     Iohexol -     CK; Standing -     C-reactive protein; Standing -     Consult to general surgery; Standing -     Vancomycin HCl in Dextrose -     piperacillin-tazobactam -     Re-check  Vital Signs; Standing -     Lactated Ringers -     Protime-INR; Standing -     APTT; Standing -     Consult for [facility] Admission; Standing    DVT prophylaxis:  Eliquis Code Status:  Full code    07/05/2022    8:50 PM  Advanced Directives  Does Patient Have a Medical Advance Directive? No  Would patient like information on creating a medical advance directive? No - Patient declined   Family Communication:  None Emergency Contact: Contact Information     Name Relation Home Work Mobile   [place] Spouse [phone]     Bari, Doorley   [phone]   Lopez,Cielo Relative   [phone]      Disposition Plan:  Home Consults: General surgery Admission status: Inpatient Unit / Expected LOS: Med/tele/2 to 3 days Gertha Calkin MD [facility]  6 PM- 2 AM. [phone] Please use [email or web address] OR call [facility] Admits & Consults @ [phone] to contact current Assigned [facility] Attending/Consulting MD for this patient.

## 2022-07-06 NOTE — Assessment & Plan Note (Signed)
Vitals:   07/05/22 2049 07/05/22 2144 07/05/22 2145 07/05/22 2230  BP: (!) 96/57 (!) 147/74 135/74 (!) 163/73   07/05/22 2300 07/06/22 0035 07/06/22 0057 07/06/22 0145  BP: 118/67 94/73 97/80  104/60   07/06/22 0200 07/06/22 0215  BP: (!) 103/52 (!) 96/44  Will start patient on LR at 50 cc/h. Will hold patient's Lasix, lisinopril.

## 2022-07-06 NOTE — ED Notes (Signed)
ED TO INPATIENT HANDOFF REPORT  ED Nurse Name and Phone #: [phone]  S Name/Age/Gender [name] 57 y.o. male Room/Bed: 024C/024C  Code Status   Code Status: Full Code  Home/SNF/Other Home Patient oriented to: self, place, time, and situation Is this baseline? Yes   Triage Complete: Triage complete  Chief Complaint Cellulitis, gluteal, left [L03.317]  Triage Note Pt arrives to ED via personal powered wheelchair. Sts having fevers /chills x1 week, new intermittent SOB x 2 days, and worsening wound to his buttocks. Urinary cath with visible sediment in place on arrival. No cough. No sick exposure. No chest pain.   Pt requires spanish speaking interpreter. Pt is poor historian as wife manages most of his care.    Allergies No Known Allergies  Level of Care/Admitting Diagnosis ED Disposition     ED Disposition  Admit   Condition  --   Comment  Hospital [facility]: [facility] [[id number]]  Level of Care: Telemetry Medical [104]  May admit patient to [facility] or [facility] if equivalent level of care is available:: Yes  Covid Evaluation: Asymptomatic - no recent exposure (last 10 days) testing not required  Diagnosis: Cellulitis, gluteal, left [161096]  Admitting Physician: Darrold Junker  Attending Physician: Darrold Junker  Certification:: I certify this patient will need inpatient services for at least 2 midnights  Estimated Length of Stay: 3          B Medical/Surgery History Past Medical History:  Diagnosis Date   Diabetes mellitus without complication (HCC)    History of cervical fracture    Past Surgical History:  Procedure Laterality Date   AMPUTATION Left 08/10/2021   Procedure: 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 12/13/21  1409  --  205   Urethral Catheter Joshua Newtpn-RN Double-lumen 16 Fr. 01/04/22  1002  Double-lumen  183   Pressure Injury 04/27/20 Heel Right Stage 2 -  Partial thickness loss of dermis presenting as a shallow open injury with a red, pink wound bed without slough. 04/27/20  --  -- 800   Pressure Injury 04/27/20 Heel Right Deep Tissue Pressure Injury - Purple or maroon localized area of discolored intact skin or blood-filled blister due to damage of underlying soft tissue from pressure and/or shear. inner heel 04/27/20  --  -- 800   Pressure Injury 08/06/21 Buttocks Stage 2 -  Partial thickness loss of dermis presenting as a shallow open injury with a red, pink wound bed without slough. 08/06/21  2100  -- 334   Wound / Incision (Open or Dehisced) 04/27/20 Foot Left 04/27/20  --  Foot  800            Intake/Output Last 24 hours No intake or output data in the 24 hours ending 07/06/22 0454  Labs/Imaging Results for orders placed or performed during the hospital encounter of 07/05/22 (from the past 48 hour(s))  Lactic acid, plasma     Status: None   Collection Time: 07/05/22  9:00 PM  Result Value Ref Range   Lactic Acid, Venous 1.1  0.5 - 1.9 mmol/L    Comment: Performed at [facility], [address] [place], [place] [address]  Comprehensive metabolic panel     Status: Abnormal   Collection Time: 07/05/22  9:00 PM  Result Value Ref Range   Sodium 134 (L) 135 - 145 mmol/L   Potassium 3.9 3.5 - 5.1 mmol/L   Chloride 101 98 - 111 mmol/L   CO2 23 22 - 32 mmol/L   Glucose, Bld 145 (H) 70 - 99 mg/dL    Comment: Glucose reference range applies only to samples taken after fasting for at least 8 hours.   BUN 11 6 - 20 mg/dL   Creatinine, Ser 6.04 0.61 - 1.24 mg/dL   Calcium 8.5 (L) 8.9 - 10.3 mg/dL    Total Protein 7.6 6.5 - 8.1 g/dL   Albumin 2.7 (L) 3.5 - 5.0 g/dL   AST 41 15 - 41 U/L   ALT 47 (H) 0 - 44 U/L   Alkaline Phosphatase 65 38 - 126 U/L   Total Bilirubin 0.8 0.3 - 1.2 mg/dL   GFR, Estimated >54 >09 mL/min    Comment: (NOTE) Calculated using the CKD-EPI Creatinine Equation (2021)    Anion gap 10 5 - 15    Comment: Performed at [facility], [address], [place], [place] [address]  CBC with Differential     Status: Abnormal   Collection Time: 07/05/22  9:00 PM  Result Value Ref Range   WBC 8.9 4.0 - 10.5 K/uL   RBC 3.64 (L) 4.22 - 5.81 MIL/uL   Hemoglobin 10.3 (L) 13.0 - 17.0 g/dL   HCT 47.8 (L) 29.5 - 62.1 %   MCV 87.4 80.0 - 100.0 fL   MCH 28.3 26.0 - 34.0 pg   MCHC 32.4 30.0 - 36.0 g/dL   RDW 30.8 65.7 - 84.6 %   Platelets 304 150 - 400 K/uL   nRBC 0.0 0.0 - 0.2 %   Neutrophils Relative % 74 %   Neutro Abs 6.6 1.7 - 7.7 K/uL   Lymphocytes Relative 16 %   Lymphs Abs 1.4 0.7 - 4.0 K/uL   Monocytes Relative 6 %   Monocytes Absolute 0.6 0.1 - 1.0 K/uL   Eosinophils Relative 2 %   Eosinophils Absolute 0.2 0.0 - 0.5 K/uL   Basophils Relative 1 %   Basophils Absolute 0.0 0.0 - 0.1 K/uL   Immature Granulocytes 1 %   Abs Immature Granulocytes 0.05 0.00 - 0.07 K/uL    Comment: Performed at [facility], [address] [place], [place] [address]  Urinalysis, Routine w reflex microscopic -Urine, Catheterized; Indwelling urinary catheter     Status: Abnormal   Collection Time: 07/05/22  9:55 PM  Result Value Ref Range   Color, Urine YELLOW YELLOW   APPearance CLOUDY (A) CLEAR   Specific Gravity, Urine 1.015 1.005 - 1.030   pH 8.0 5.0 - 8.0   Glucose, UA NEGATIVE NEGATIVE mg/dL   Hgb urine dipstick MODERATE (A) NEGATIVE   Bilirubin Urine SMALL (A) NEGATIVE   Ketones, ur NEGATIVE NEGATIVE mg/dL   Protein, ur 284 (A) NEGATIVE mg/dL   Nitrite NEGATIVE NEGATIVE   Leukocytes,Ua LARGE (A) NEGATIVE   RBC / HPF >50 0 - 5 RBC/hpf   WBC, UA >50 0 - 5  WBC/hpf   Bacteria, UA MANY (A) NONE SEEN   Squamous Epithelial / HPF 0-5 0 - 5 /HPF   WBC Clumps PRESENT    Mucus  PRESENT     Comment: Performed at [facility], [address] [place], [place] [address]  CK     Status: None   Collection Time: 07/06/22  1:24 AM  Result Value Ref Range   Total CK 108 49 - 397 U/L    Comment: Performed at [facility], [address], [place], [place] [address]  C-reactive protein     Status: Abnormal   Collection Time: 07/06/22  1:24 AM  Result Value Ref Range   CRP 15.8 (H) <1.0 mg/dL    Comment: Performed at [facility], [address] [place], [place] [address]  Protime-INR     Status: Abnormal   Collection Time: 07/06/22  1:46 AM  Result Value Ref Range   Prothrombin Time 19.0 (H) 11.4 - 15.2 seconds   INR 1.6 (H) 0.8 - 1.2    Comment: (NOTE) INR goal varies based on device and disease states. Performed at [facility], [address] [place], [place] [address]   APTT     Status: Abnormal   Collection Time: 07/06/22  1:46 AM  Result Value Ref Range   aPTT 46 (H) 24 - 36 seconds    Comment:        IF BASELINE aPTT IS ELEVATED, SUGGEST PATIENT RISK ASSESSMENT BE USED TO DETERMINE APPROPRIATE ANTICOAGULANT THERAPY. Performed at [facility], [address], [place], [place] [address]    CT ABDOMEN PELVIS W CONTRAST  Result Date: 07/06/2022 CLINICAL DATA:  Abdominal pain, acute, nonlocalized buttucks cellulitis and concern for abscess EXAM: CT ABDOMEN AND PELVIS WITH CONTRAST TECHNIQUE: Multidetector CT imaging of the abdomen and pelvis was performed using the standard protocol following bolus administration of intravenous contrast. RADIATION DOSE REDUCTION: This exam was performed according to the departmental dose-optimization program which includes automated exposure control, adjustment of the mA and/or kV according to patient size and/or use of iterative reconstruction technique. CONTRAST:  OMNIPAQUE  IOHEXOL 300 MG/ML  SOLN COMPARISON:  None Available. FINDINGS: Lower chest: No acute abnormality. Hepatobiliary: No focal liver abnormality. No gallstones, gallbladder wall thickening, or pericholecystic fluid. No biliary dilatation. Pancreas: No focal lesion. Normal pancreatic contour. No surrounding inflammatory changes. No main pancreatic ductal dilatation. Spleen: Normal in size without focal abnormality.  Splenule noted. Adrenals/Urinary Tract: No adrenal nodule bilaterally. Bilateral kidneys enhance symmetrically. Left nephrolithiasis measuring up to 2.9 cm. Associated mild fullness of the left collecting system with peripelvic fat stranding. No frank hydroureteronephrosis. Mild left urothelial thickening. No left ureterolithiasis. No right nephroureterolithiasis. No right hydronephrosis. The urinary bladder is decompressed with Foley catheter tip and balloon terminating within the lumen. On delayed imaging, there is no urothelial wall thickening and there are no filling defects in the opacified portions of the bilateral collecting systems or ureters. Stomach/Bowel: Stomach is within normal limits. No evidence of bowel wall thickening or dilatation. Few scattered colonic diverticula. Appendix appears normal. Vascular/Lymphatic: Inferior vena cava filter in grossly appropriate position. No abdominal aorta or iliac aneurysm. Mild atherosclerotic plaque of the aorta and its branches. Borderline enlarged bilateral pelvis sidewall lymph nodes. Otherwise no abdominal, pelvic, or inguinal lymphadenopathy. Reproductive: Prostate is unremarkable. Other: No intraperitoneal free fluid. No intraperitoneal free gas. No organized fluid collection. Musculoskeletal: Left medial gluteal cleft subcutaneus soft tissue emphysema. Associated overlying mild dermal thickening and subcutaneus soft tissue fat stranding. No organized fluid collection. Atrophic musculature. No suspicious lytic or blastic osseous lesions. No acute  displaced fracture. Multilevel degenerative  changes of the spine. IMPRESSION: 1. Left medial gluteal cleft subcutaneus soft tissue emphysema. Associated overlying mild dermal thickening and subcutaneus soft tissue fat stranding. No organized abscess identified. Findings suggestive of infection. A necrotizing fasciitis cannot be excluded as this is a clinical diagnosis. 2. Nonobstructive left nephrolithiasis measuring up to 2.9 cm with question superimposed left collecting system infection. Correlate with urinalysis. 3. Colonic diverticulosis with no acute diverticulitis. 4. Inferior vena cava filter in appropriate position. 5. Aortic Atherosclerosis (ICD10-I70.0). 6. Borderline enlarged bilateral pelvis sidewall lymph nodes. Recommend attention on follow-up. Electronically Signed   By: Tish Frederickson M.D.   On: 07/06/2022 00:24   DG Chest 2 View  Result Date: 07/05/2022 CLINICAL DATA:  Fever and chills x1 week. EXAM: CHEST - 2 VIEW COMPARISON:  January 04, 2022 FINDINGS: The heart size and mediastinal contours are within normal limits. Both lungs are clear. Stable postoperative changes are seen within the lower cervical and mid to upper thoracic spine. The visualized skeletal structures are unremarkable. IMPRESSION: No active cardiopulmonary disease. Electronically Signed   By: Aram Candela M.D.   On: 07/05/2022 22:31    Pending Labs Unresulted Labs (From admission, onward)     Start     Ordered   07/06/22 0500  Comprehensive metabolic panel  Tomorrow morning,   R        07/06/22 0248   07/06/22 0500  CBC  Tomorrow morning,   R        07/06/22 0248   07/06/22 0245  Hemoglobin A1c  Add-on,   AD        07/06/22 0248   07/05/22 2249  Urine Culture  Once,   URGENT       Question:  Indication  Answer:  Acute gross hematuria   07/05/22 2248            Vitals/Pain Today's Vitals   07/06/22 0145 07/06/22 0200 07/06/22 0215 07/06/22 0231  BP: 104/60 (!) 103/52 (!) 96/44   Pulse: 81 82 80    Resp: 20 18 11    Temp:      TempSrc:      SpO2: 97% 100% 100%   Weight:      Height:      PainSc:    Asleep    Isolation Precautions No active isolations  Medications Medications  vancomycin (VANCOCIN) IVPB 1000 mg/200 mL premix (1,000 mg Intravenous New Bag/Given 07/06/22 0216)  lactated ringers infusion ( Intravenous New Bag/Given 07/06/22 0216)  FLUoxetine (PROZAC) capsule 20 mg (has no administration in time range)  gabapentin (NEURONTIN) capsule 600 mg (has no administration in time range)  lisinopril (ZESTRIL) tablet 10 mg (has no administration in time range)  baclofen (LIORESAL) tablet 10 mg (has no administration in time range)  lactated ringers infusion (has no administration in time range)  acetaminophen (TYLENOL) tablet 650 mg (has no administration in time range)    Or  acetaminophen (TYLENOL) suppository 650 mg (has no administration in time range)  morphine (PF) 2 MG/ML injection 2 mg (has no administration in time range)  docusate sodium (COLACE) capsule 100 mg (has no administration in time range)  bisacodyl (DULCOLAX) EC tablet 5 mg (has no administration in time range)  ondansetron (ZOFRAN) tablet 4 mg (has no administration in time range)    Or  ondansetron (ZOFRAN) injection 4 mg (has no administration in time range)  hydrALAZINE (APRESOLINE) injection 5 mg (has no administration in time range)  oxyCODONE (Oxy IR/ROXICODONE) immediate release tablet 5 mg (  has no administration in time range)  vancomycin (VANCOREADY) IVPB 1250 mg/250 mL (has no administration in time range)  metoCLOPramide (REGLAN) injection 10 mg (10 mg Intravenous Given 07/06/22 0032)  diphenhydrAMINE (BENADRYL) capsule 25 mg (25 mg Oral Given 07/06/22 0032)  acetaminophen (TYLENOL) tablet 1,000 mg (1,000 mg Oral Given 07/06/22 0033)  sodium chloride 0.9 % bolus 1,000 mL (0 mLs Intravenous Stopped 07/06/22 0143)  cefTRIAXone (ROCEPHIN) 1 g in sodium chloride 0.9 % 100 mL IVPB (0 g Intravenous  Stopped 07/06/22 0139)  iohexol (OMNIPAQUE) 300 MG/ML solution 100 mL (100 mLs Intravenous Contrast Given 07/05/22 2357)  piperacillin-tazobactam (ZOSYN) IVPB 3.375 g (0 g Intravenous Stopped 07/06/22 0212)    Mobility power wheelchair       R Recommendations: See Admitting Provider Note  Report given to:   Additional Notes: worsening wound on buttock, power wheelchair at baseline, 2 days SOB

## 2022-07-06 NOTE — Op Note (Signed)
  07/06/2022  1:26 PM  PATIENT:  [name]  57 y.o. male  PRE-OPERATIVE DIAGNOSIS:  Infected Sacral Pressure Ulcer  POST-OPERATIVE DIAGNOSIS:  Infected Sacral Pressure Ulcer  PROCEDURE:  Procedure(s): IRRIGATION AND DEBRIDEMENT SACRAL WOUND 11x6x4.5cm  SURGEON:  Surgeon(s): Violeta Gelinas, MD  ASSISTANTS: none   ANESTHESIA:   general  EBL:  Total I/O In: 850 [I.V.:800; IV Piggyback:50] Out: 1400 [Urine:1400]  BLOOD ADMINISTERED:none  DRAINS: none   SPECIMEN:  Excision  DISPOSITION OF SPECIMEN:  PATHOLOGY  COUNTS:  YES  DICTATION: Reubin Milan Dictation Excisional debridement:  1.  Patient is brought for emergent debridement of infected sacral wound.  Informed consent was obtained.  He received IV Zosyn and vancomycin.  He was brought the operating room and general endotracheal anesthesia was administered by the anesthesia staff.  He was placed in prone positioning with appropriate padding.  His sacral area and buttock area was prepped and draped in a sterile fashion.  Timeout procedure was done.  There was a large necrotic sacral wound extending more to his left buttock but also involving the right buttock.  I excised the necrotic tissue using cautery.  The tissue had pockets of purulence which was sent for culture.  I debrided it down to viable tissue with dimensions as below.  I used cautery to get excellent hemostasis.  The area was copiously irrigated.  Hemostasis was ensured and the wound was packed with a sterile wet-to-dry dressing.  All counts were correct.  He tolerated the procedure well was taken recovery in stable condition.  There were no apparent complications.  2.  Tool used for debridement (curette, scapel, etc.)  cautery  3.  Frequency of surgical debridement.   First time  4.  Measurement of total devitalized tissue (wound surface) before and after surgical debridement.   Skin was intact but ischemic and infected looking prior to surgery, after  debridement, wound is 11 cm long by 6 cm wide by 4.5 cm deep  5.  Area and depth of devitalized tissue removed from wound.  See above  6.  Blood loss and description of tissue removed.  Necrotic purulent tissue  7.  Evidence of the progress of the wound's response to treatment.  A.  Current wound volume (current dimensions and depth).  See above  B.  Presence (and extent of) of infection.  Present  C.  Presence (and extent of) of non viable tissue.  Present  D.  Other material in the wound that is expected to inhibit healing.  Now  8.  Was there any viable tissue removed (measurements): Minimal  PATIENT DISPOSITION:  PACU - hemodynamically stable.   Delay start of Pharmacological VTE agent (>24hrs) due to surgical blood loss or risk of bleeding:  no  Violeta Gelinas, MD, MPH, FACS Pager: [phone]  5/25/20241:26 PM

## 2022-07-06 NOTE — Anesthesia Procedure Notes (Signed)
Procedure Name: Intubation Date/Time: 07/06/2022 12:35 PM  Performed by: Bishop Limbo, CRNAPre-anesthesia Checklist: Patient identified, Emergency Drugs available, Suction available and Patient being monitored Patient Re-evaluated:Patient Re-evaluated prior to induction Oxygen Delivery Method: Circle System Utilized Preoxygenation: Pre-oxygenation with 100% oxygen Induction Type: IV induction Ventilation: Two handed mask ventilation required and Oral airway inserted - appropriate to patient size Laryngoscope Size: Glidescope and 4 Grade View: Grade I Tube type: Oral Tube size: 7.0 mm Number of attempts: 1 Airway Equipment and Method: Oral airway, Rigid stylet and Video-laryngoscopy Placement Confirmation: ETT inserted through vocal cords under direct vision, positive ETCO2 and breath sounds checked- equal and bilateral Secured at: 22 cm Tube secured with: Tape Dental Injury: Teeth and Oropharynx as per pre-operative assessment  Comments: Pt with immobile neck; difficulty anticipated.

## 2022-07-06 NOTE — Assessment & Plan Note (Signed)
Patient on Eliquis which is currently held.

## 2022-07-06 NOTE — Progress Notes (Signed)
Pacu RN Report to floor given  Gave report to  [employer]. Room: 5N20  Discussed surgery, meds given in OR and Pacu, VS, IV fluids given, EBL, urine output, pain and other pertinent information. Also discussed if pt had any family or friends here or belongings with them.   Pt is a parapalegic, moves arms only, no sensation below axilla. No pain. VSS.  Wound has a dressing or kerlex, 4x4. ABD and tape, slight drainage noted.   Wife was updated.   Pt exits my care.

## 2022-07-06 NOTE — Anesthesia Postprocedure Evaluation (Signed)
Anesthesia Post Note  Patient: [name]  Procedure(s) Performed: IRRIGATION AND DEBRIDEMENT SACRAL WOUND (Buttocks)     Patient location during evaluation: PACU Anesthesia Type: General Level of consciousness: awake and alert Pain management: pain level controlled Vital Signs Assessment: post-procedure vital signs reviewed and stable Respiratory status: spontaneous breathing, nonlabored ventilation, respiratory function stable and patient connected to nasal cannula oxygen Cardiovascular status: blood pressure returned to baseline and stable Postop Assessment: no apparent nausea or vomiting Anesthetic complications: no  No notable events documented.  Last Vitals:  Vitals:   07/06/22 1405 07/06/22 1437  BP:  (!) 126/44  Pulse: 76 73  Resp: 12 16  Temp: 36.6 C   SpO2: 99% 95%    Last Pain:  Vitals:   07/06/22 1330  TempSrc:   PainSc: 0-No pain                 Shelton Silvas

## 2022-07-06 NOTE — Assessment & Plan Note (Signed)
Urinalysis    Component Value Date/Time   COLORURINE YELLOW 07/05/2022 2155   APPEARANCEUR CLOUDY (A) 07/05/2022 2155   LABSPEC 1.015 07/05/2022 2155   PHURINE 8.0 07/05/2022 2155   GLUCOSEU NEGATIVE 07/05/2022 2155   HGBUR MODERATE (A) 07/05/2022 2155   BILIRUBINUR SMALL (A) 07/05/2022 2155   KETONESUR NEGATIVE 07/05/2022 2155   PROTEINUR 100 (A) 07/05/2022 2155   NITRITE NEGATIVE 07/05/2022 2155   LEUKOCYTESUR LARGE (A) 07/05/2022 2155  Currently continue patient on Zosyn he has a history of ESBL UTI as well.
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
## 2022-07-06 NOTE — Consult Note (Signed)
Reason for Consult: sacral wound infection Referring Provider: Dione Booze  [name] is an 57 y.o. male.  HPI: 57 yo male with paraplegia for 4 years has developed a sacral wound. The wound has been present for 3 weeks but in the last week he has started feeling worse and having fevers. He has not had a sacral wound issue before but has had pressure ulcers in other areas.  Past Medical History:  Diagnosis Date   Diabetes mellitus without complication (HCC)    History of cervical fracture     Past Surgical History:  Procedure Laterality Date   AMPUTATION Left 08/10/2021   Procedure: LEFT 5TH RAY AMPUTATION;  Surgeon: [name], MD;  Location: [facility] OR;  Service: Orthopedics;  Laterality: Left;   CERVICAL SPINE SURGERY      Family History  Problem Relation Age of Onset   Diabetes Mother    Diabetes Father     Social History:  reports that he has never smoked. He has never used smokeless tobacco. He reports current alcohol use. He reports that he does not use drugs.  Allergies: No Known Allergies  Medications: I have reviewed the patient's current medications.  Results for orders placed or performed during the hospital encounter of 07/05/22 (from the past 48 hour(s))  Lactic acid, plasma     Status: None   Collection Time: 07/05/22  9:00 PM  Result Value Ref Range   Lactic Acid, Venous 1.1 0.5 - 1.9 mmol/L    Comment: Performed at [facility], [address] [place], [place] [address]  Comprehensive metabolic panel     Status: Abnormal   Collection Time: 07/05/22  9:00 PM  Result Value Ref Range   Sodium 134 (L) 135 - 145 mmol/L   Potassium 3.9 3.5 - 5.1 mmol/L   Chloride 101 98 - 111 mmol/L   CO2 23 22 - 32 mmol/L   Glucose, Bld 145 (H) 70 - 99 mg/dL    Comment: Glucose reference range applies only to samples taken after fasting for at least 8 hours.   BUN 11 6 - 20 mg/dL   Creatinine, Ser 6.04 0.61 - 1.24 mg/dL   Calcium 8.5 (L) 8.9 - 10.3 mg/dL    Total Protein 7.6 6.5 - 8.1 g/dL   Albumin 2.7 (L) 3.5 - 5.0 g/dL   AST 41 15 - 41 U/L   ALT 47 (H) 0 - 44 U/L   Alkaline Phosphatase 65 38 - 126 U/L   Total Bilirubin 0.8 0.3 - 1.2 mg/dL   GFR, Estimated >54 >09 mL/min    Comment: (NOTE) Calculated using the CKD-EPI Creatinine Equation (2021)    Anion gap 10 5 - 15    Comment: Performed at [facility], [address] [place], [place] [address]  CBC with Differential     Status: Abnormal   Collection Time: 07/05/22  9:00 PM  Result Value Ref Range   WBC 8.9 4.0 - 10.5 K/uL   RBC 3.64 (L) 4.22 - 5.81 MIL/uL   Hemoglobin 10.3 (L) 13.0 - 17.0 g/dL   HCT 47.8 (L) 29.5 - 62.1 %   MCV 87.4 80.0 - 100.0 fL   MCH 28.3 26.0 - 34.0 pg   MCHC 32.4 30.0 - 36.0 g/dL   RDW 30.8 65.7 - 84.6 %   Platelets 304 150 - 400 K/uL   nRBC 0.0 0.0 - 0.2 %   Neutrophils Relative % 74 %   Neutro Abs  6.6 1.7 - 7.7 K/uL   Lymphocytes Relative 16 %   Lymphs Abs 1.4 0.7 - 4.0 K/uL   Monocytes Relative 6 %   Monocytes Absolute 0.6 0.1 - 1.0 K/uL   Eosinophils Relative 2 %   Eosinophils Absolute 0.2 0.0 - 0.5 K/uL   Basophils Relative 1 %   Basophils Absolute 0.0 0.0 - 0.1 K/uL   Immature Granulocytes 1 %   Abs Immature Granulocytes 0.05 0.00 - 0.07 K/uL    Comment: Performed at [facility], [address] [place], [place] [address]  Urinalysis, Routine w reflex microscopic -Urine, Catheterized; Indwelling urinary catheter     Status: Abnormal   Collection Time: 07/05/22  9:55 PM  Result Value Ref Range   Color, Urine YELLOW YELLOW   APPearance CLOUDY (A) CLEAR   Specific Gravity, Urine 1.015 1.005 - 1.030   pH 8.0 5.0 - 8.0   Glucose, UA NEGATIVE NEGATIVE mg/dL   Hgb urine dipstick MODERATE (A) NEGATIVE   Bilirubin Urine SMALL (A) NEGATIVE   Ketones, ur NEGATIVE NEGATIVE mg/dL   Protein, ur 604 (A) NEGATIVE mg/dL   Nitrite NEGATIVE NEGATIVE   Leukocytes,Ua LARGE (A) NEGATIVE   RBC / HPF >50 0 - 5 RBC/hpf   WBC, UA >50 0 - 5  WBC/hpf   Bacteria, UA MANY (A) NONE SEEN   Squamous Epithelial / HPF 0-5 0 - 5 /HPF   WBC Clumps PRESENT    Mucus PRESENT     Comment: Performed at [facility], [address], [place], [place] [address]    CT ABDOMEN PELVIS W CONTRAST  Result Date: 07/06/2022 CLINICAL DATA:  Abdominal pain, acute, nonlocalized buttucks cellulitis and concern for abscess EXAM: CT ABDOMEN AND PELVIS WITH CONTRAST TECHNIQUE: Multidetector CT imaging of the abdomen and pelvis was performed using the standard protocol following bolus administration of intravenous contrast. RADIATION DOSE REDUCTION: This exam was performed according to the departmental dose-optimization program which includes automated exposure control, adjustment of the mA and/or kV according to patient size and/or use of iterative reconstruction technique. CONTRAST:  OMNIPAQUE IOHEXOL 300 MG/ML  SOLN COMPARISON:  None Available. FINDINGS: Lower chest: No acute abnormality. Hepatobiliary: No focal liver abnormality. No gallstones, gallbladder wall thickening, or pericholecystic fluid. No biliary dilatation. Pancreas: No focal lesion. Normal pancreatic contour. No surrounding inflammatory changes. No main pancreatic ductal dilatation. Spleen: Normal in size without focal abnormality.  Splenule noted. Adrenals/Urinary Tract: No adrenal nodule bilaterally. Bilateral kidneys enhance symmetrically. Left nephrolithiasis measuring up to 2.9 cm. Associated mild fullness of the left collecting system with peripelvic fat stranding. No frank hydroureteronephrosis. Mild left urothelial thickening. No left ureterolithiasis. No right nephroureterolithiasis. No right hydronephrosis. The urinary bladder is decompressed with Foley catheter tip and balloon terminating within the lumen. On delayed imaging, there is no urothelial wall thickening and there are no filling defects in the opacified portions of the bilateral collecting systems or ureters. Stomach/Bowel:  Stomach is within normal limits. No evidence of bowel wall thickening or dilatation. Few scattered colonic diverticula. Appendix appears normal. Vascular/Lymphatic: Inferior vena cava filter in grossly appropriate position. No abdominal aorta or iliac aneurysm. Mild atherosclerotic plaque of the aorta and its branches. Borderline enlarged bilateral pelvis sidewall lymph nodes. Otherwise no abdominal, pelvic, or inguinal lymphadenopathy. Reproductive: Prostate is unremarkable. Other: No intraperitoneal free fluid. No intraperitoneal free gas. No organized fluid collection. Musculoskeletal: Left medial gluteal cleft subcutaneus soft tissue emphysema. Associated overlying mild dermal thickening and subcutaneus soft tissue  fat stranding. No organized fluid collection. Atrophic musculature. No suspicious lytic or blastic osseous lesions. No acute displaced fracture. Multilevel degenerative changes of the spine. IMPRESSION: 1. Left medial gluteal cleft subcutaneus soft tissue emphysema. Associated overlying mild dermal thickening and subcutaneus soft tissue fat stranding. No organized abscess identified. Findings suggestive of infection. A necrotizing fasciitis cannot be excluded as this is a clinical diagnosis. 2. Nonobstructive left nephrolithiasis measuring up to 2.9 cm with question superimposed left collecting system infection. Correlate with urinalysis. 3. Colonic diverticulosis with no acute diverticulitis. 4. Inferior vena cava filter in appropriate position. 5. Aortic Atherosclerosis (ICD10-I70.0). 6. Borderline enlarged bilateral pelvis sidewall lymph nodes. Recommend attention on follow-up. Electronically Signed   By: Tish Frederickson M.D.   On: 07/06/2022 00:24   DG Chest 2 View  Result Date: 07/05/2022 CLINICAL DATA:  Fever and chills x1 week. EXAM: CHEST - 2 VIEW COMPARISON:  January 04, 2022 FINDINGS: The heart size and mediastinal contours are within normal limits. Both lungs are clear. Stable  postoperative changes are seen within the lower cervical and mid to upper thoracic spine. The visualized skeletal structures are unremarkable. IMPRESSION: No active cardiopulmonary disease. Electronically Signed   By: Aram Candela M.D.   On: 07/05/2022 22:31    Review of Systems  Constitutional:  Positive for fever.  HENT: Negative.    Eyes: Negative.   Respiratory: Negative.    Cardiovascular: Negative.   Gastrointestinal: Negative.   Genitourinary: Negative.   Musculoskeletal: Negative.   Skin: Negative.   Neurological:  Positive for weakness.  Endo/Heme/Allergies: Negative.   Psychiatric/Behavioral: Negative.      PE Blood pressure 97/80, pulse 85, temperature 99 F (37.2 C), temperature source Oral, resp. rate 14, height 5\' 8"  (1.727 m), weight 127 kg, SpO2 99 %. Constitutional: NAD; conversant; no deformities Eyes: Moist conjunctiva; no lid lag; anicteric; PERRL Neck: Trachea midline; no thyromegaly Lungs: Normal respiratory effort; no tactile fremitus CV: RRR; no palpable thrills; no pitting edema GI: Abd soft, NT; no palpable hepatosplenomegaly MSK: quadraplegic; no clubbing/cyanosis Psychiatric: Appropriate affect; alert and oriented x3 Lymphatic: No palpable cervical or axillary lymphadenopathy Skin: No major subcutaneous nodules. 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 No crepitus and there are multiple full thickness skin defects to explain the gas. -recommend admission to the hospitalist service -broad spectrum antibiotics -plan for debridement during hospitalization -hold eliquis for surgery -Discussed plan with patient and family including open wound and need for ongoing wound care to help ulcer heal. All questions were answered.  I reviewed last 24 h vitals and pain scores, last 48 h intake and output, last 24 h labs and trends, and last 24 h imaging results.  This care required high   level of medical decision making.   De Blanch Abrahim Sargent 07/06/2022, 1:25 AM

## 2022-07-07 ENCOUNTER — Encounter (HOSPITAL_COMMUNITY): Payer: Self-pay | Admitting: General Surgery

## 2022-07-07 DIAGNOSIS — G8250 Quadriplegia, unspecified: Secondary | ICD-10-CM | POA: Diagnosis not present

## 2022-07-07 DIAGNOSIS — L0390 Cellulitis, unspecified: Secondary | ICD-10-CM | POA: Diagnosis not present

## 2022-07-07 DIAGNOSIS — Z95828 Presence of other vascular implants and grafts: Secondary | ICD-10-CM

## 2022-07-07 DIAGNOSIS — N3001 Acute cystitis with hematuria: Secondary | ICD-10-CM | POA: Diagnosis not present

## 2022-07-07 DIAGNOSIS — R8271 Bacteriuria: Secondary | ICD-10-CM

## 2022-07-07 DIAGNOSIS — Z794 Long term (current) use of insulin: Secondary | ICD-10-CM

## 2022-07-07 DIAGNOSIS — E119 Type 2 diabetes mellitus without complications: Secondary | ICD-10-CM | POA: Diagnosis not present

## 2022-07-07 DIAGNOSIS — G8220 Paraplegia, unspecified: Secondary | ICD-10-CM

## 2022-07-07 DIAGNOSIS — A419 Sepsis, unspecified organism: Secondary | ICD-10-CM | POA: Diagnosis not present

## 2022-07-07 DIAGNOSIS — L03317 Cellulitis of buttock: Secondary | ICD-10-CM | POA: Diagnosis not present

## 2022-07-07 LAB — CBC WITH DIFFERENTIAL/PLATELET
Abs Immature Granulocytes: 0.04 10*3/uL (ref 0.00–0.07)
Basophils Absolute: 0 10*3/uL (ref 0.0–0.1)
Basophils Relative: 0 %
Eosinophils Absolute: 0 10*3/uL (ref 0.0–0.5)
Eosinophils Relative: 0 %
HCT: 30.2 % — ABNORMAL LOW (ref 39.0–52.0)
Hemoglobin: 10.1 g/dL — ABNORMAL LOW (ref 13.0–17.0)
Immature Granulocytes: 0 %
Lymphocytes Relative: 11 %
Lymphs Abs: 1.1 10*3/uL (ref 0.7–4.0)
MCH: 29 pg (ref 26.0–34.0)
MCHC: 33.4 g/dL (ref 30.0–36.0)
MCV: 86.8 fL (ref 80.0–100.0)
Monocytes Absolute: 0.4 10*3/uL (ref 0.1–1.0)
Monocytes Relative: 4 %
Neutro Abs: 8 10*3/uL — ABNORMAL HIGH (ref 1.7–7.7)
Neutrophils Relative %: 85 %
Platelets: 324 10*3/uL (ref 150–400)
RBC: 3.48 MIL/uL — ABNORMAL LOW (ref 4.22–5.81)
RDW: 13.3 % (ref 11.5–15.5)
WBC: 9.5 10*3/uL (ref 4.0–10.5)
nRBC: 0 % (ref 0.0–0.2)

## 2022-07-07 LAB — BASIC METABOLIC PANEL
Anion gap: 10 (ref 5–15)
BUN: 10 mg/dL (ref 6–20)
CO2: 22 mmol/L (ref 22–32)
Calcium: 8.6 mg/dL — ABNORMAL LOW (ref 8.9–10.3)
Chloride: 101 mmol/L (ref 98–111)
Creatinine, Ser: 0.71 mg/dL (ref 0.61–1.24)
GFR, Estimated: >60 mL/min (ref >60)
Glucose, Bld: 197 mg/dL — ABNORMAL HIGH (ref 70–99)
Potassium: 3.6 mmol/L (ref 3.5–5.1)
Sodium: 133 mmol/L — ABNORMAL LOW (ref 135–145)

## 2022-07-07 LAB — HEPATITIS A ANTIBODY, TOTAL: hep A Total Ab: REACTIVE — AB

## 2022-07-07 LAB — AEROBIC/ANAEROBIC CULTURE W GRAM STAIN (SURGICAL/DEEP WOUND)

## 2022-07-07 LAB — GLUCOSE, CAPILLARY
Glucose-Capillary: 136 mg/dL — ABNORMAL HIGH (ref 70–99)
Glucose-Capillary: 149 mg/dL — ABNORMAL HIGH (ref 70–99)
Glucose-Capillary: 165 mg/dL — ABNORMAL HIGH (ref 70–99)
Glucose-Capillary: 168 mg/dL — ABNORMAL HIGH (ref 70–99)
Glucose-Capillary: 195 mg/dL — ABNORMAL HIGH (ref 70–99)
Glucose-Capillary: 222 mg/dL — ABNORMAL HIGH (ref 70–99)

## 2022-07-07 LAB — MAGNESIUM: Magnesium: 1.8 mg/dL (ref 1.7–2.4)

## 2022-07-07 MED ORDER — SODIUM CHLORIDE 0.9 % IV BOLUS
500.0000 mL | Freq: Once | INTRAVENOUS | Status: AC
  Administered 2022-07-07: 500 mL via INTRAVENOUS

## 2022-07-07 MED ORDER — SODIUM CHLORIDE 0.9 % IV SOLN
8.0000 mg/kg | Freq: Every day | INTRAVENOUS | Status: DC
  Administered 2022-07-07 – 2022-07-09 (×3): 750 mg via INTRAVENOUS
  Filled 2022-07-07 (×4): qty 15

## 2022-07-07 MED ORDER — ENOXAPARIN SODIUM 120 MG/0.8ML IJ SOSY
120.0000 mg | PREFILLED_SYRINGE | Freq: Two times a day (BID) | INTRAMUSCULAR | Status: DC
  Administered 2022-07-07 – 2022-07-09 (×4): 120 mg via SUBCUTANEOUS
  Filled 2022-07-07 (×4): qty 0.8

## 2022-07-07 MED ORDER — ENOXAPARIN SODIUM 40 MG/0.4ML IJ SOSY: 40.0000 mg | PREFILLED_SYRINGE | INTRAMUSCULAR | Status: DC

## 2022-07-07 NOTE — Progress Notes (Signed)
Pharmacy Antibiotic Note  [name] is a 57 y.o. male admitted on 07/05/2022 with sacral wound ulcer.  Pharmacy initially consulted for vancomycin and Zosyn dosing for wound infection.   POD #1 s/p I&D sacral ulcer 5/25. ID following , orders change abx to dapto and zosyn to avoid nephrotoxicity.  Now pharmacy consulted to dose  Daptomycin for osteomyelitis.   Obesity, BMI 42.5.   Will use adjusted body wt 91.8 kg for daptomycin dosing.  Plan: Start Daptomycin 750 mg IV q24h (8mg /kg, Adjusted body weight used) Continue Zosyn 3.375 g IV q8 hr (extended infusion over 4 hour) Monitor clinical progress, renal function, cultures  Monitor CK weekly qMon, due in AM  Height: 5' 7.99" (172.7 cm) Weight: 127 kg (280 lb) IBW/kg (Calculated) : 68.38  Temp (24hrs), Avg:97.9 F (36.6 C), Min:97.5 F (36.4 C), Max:98.5 F (36.9 C)  Recent Labs  Lab 07/05/22 2100 07/06/22 0346 07/07/22 0216  WBC 8.9 8.6 9.5  CREATININE 0.83 0.67 0.71  LATICACIDVEN 1.1  --   --      Estimated Creatinine Clearance: 137.1 mL/min (by C-G formula based on SCr of 0.71 mg/dL).    No Known Allergies  Antibiotics this admission: Ceftriaxone 5/24 x1 Vanc 5/25>>5/26 Zosyn 5/25>> Dapto 5/26>>  Microbiology   5/24 UCx :  GNR pending 5/25 sacral wound cx : GPC, GNR pending   Thank you for allowing pharmacy to be a part of this patient's care.  Noah Delaine, RPh Clinical Pharmacist 07/07/2022 11:29 AM Please check AMION for all [facility] phone numbers After 10:00 PM, call Main Pharmacy [phone]

## 2022-07-07 NOTE — Plan of Care (Signed)
  Problem: Clinical Measurements: Goal: Ability to maintain clinical measurements within normal limits will improve Outcome: Progressing Goal: Will remain free from infection Outcome: Progressing Goal: Diagnostic test results will improve Outcome: Progressing   Problem: Activity: Goal: Risk for activity intolerance will decrease Outcome: Progressing   Problem: Nutrition: Goal: Adequate nutrition will be maintained Outcome: Progressing   Problem: Safety: Goal: Ability to remain free from injury will improve Outcome: Progressing   

## 2022-07-07 NOTE — Progress Notes (Signed)
PROGRESS NOTE    Man Mom  ZOX:[id number] DOB: 1965/03/11 DOA: 07/05/2022 PCP: System, Provider Not In    Chief Complaint  Patient presents with   Fever    Brief Narrative: Is a 57 year old gentleman history of diabetes, paraplegia, PE/DVT status post IVC filter, neurogenic bladder with chronic Foley wheelchair-bound presented to the ED with fevers.  Workup concerning for a infected decubitus ulcer.  CT abdomen and pelvis performed with left medial gluteal cleft tissue emphysema with overlying dermal thickening and fat stranding concerning for infection.  Known obstructive left-sided nephrolithiasis with colonic diverticulosis.  General surgery consulted and patient underwent irrigation and debridement of sacral wound with necrotic purulent tissue removed and cultures sent.  Patient placed empirically on IV vancomycin and Zosyn.  ID also consulted.   Assessment & Plan:   Principal Problem:   Cellulitis, gluteal, left Active Problems:   Quadriplegia (HCC)   Presence of IVC filter   Sepsis due to cellulitis (HCC)   DM2 (diabetes mellitus, type 2) (HCC)   History of DVT (deep vein thrombosis)   Acute cystitis with hematuria   Hypertension   Paraplegia (HCC)  #1 infected sacral decubitus ulcer -Status post excisional debridement of sacral wound per Dr. Violeta Gelinas 07/06/2022 with necrotic purulent tissue removed, cultures pending. -Preliminary cultures predominantly mononuclear with few gram-positive cocci in pairs, few gram-negative rods. -Patient was on IV vancomycin, IV Zosyn. -ID consulted patient seen in consultation by Dr. Daiva Eves and IV vancomycin discontinued and changed to IV daptomycin and recommending continuation of Zosyn pending surgical cultures. -Per ID patient with no exposed bone however recommending a protracted oral antibiotics if organisms can be pinned down and are sensitive to oral antibiotics. -ID and general surgery following and appreciate input and  recommendations.  2.?  UTI versus bacteriuria/colonization -Patient with chronic indwelling Foley catheter with paraplegia. -Urinalysis done with large leukocytes, nitrite negative, many bacteria, WBC > 50.  -Urine cultures with > 100,000 colonies of E. coli and Proteus Mirabilis with sensitivities pending. -Patient seen in consultation by ID who feel could likely be a colonization in the patient with chronic indwelling Foley catheter and few patient source of infection more from the sacral decubitus ulcer. -Patient empirically on IV antibiotics.  3.  History of DVT -Place on full dose Lovenox while holding Eliquis in case patient needs further surgical debridement.  4.  Sepsis secondary to sacral decubitus ulcer -Patient on admission met criteria for sepsis due to tachycardia tachypnea and source of infection. -Status post I&D with surgical cultures pending. -Continue IV antibiotics.  5.  History of IVC filter -Patient high risk for DVT and PE. -Was on Eliquis which was held in anticipation of procedure. -See #3.  6.  Diabetes mellitus type 2 -Hemoglobin A1c 7.3 (08/06/2021) -Repeat hemoglobin A1c pending. -CBG 195 this morning. -Hold home regimen oral hypoglycemic agents as well as long-acting insulin. -SSI.  7.  Hypertension -BP soft. -Discontinue lisinopril. -IV fluids.   7.    DVT prophylaxis: Lovenox Code Status: Full Family Communication: Updated patient.  No family at bedside. Disposition: TBD  Status is: Inpatient Remains inpatient appropriate because: Severity of illness   Consultants:  General surgery: Dr. Sheliah Hatch 07/06/2022 ID: Dr. Daiva Eves 07/07/2022  Procedures:  CT abdomen and pelvis 07/05/2022 Chest x-ray 07/05/2022 Irrigation and debridement sacral wound 11 x 6 x 4.5 cm per Dr. Violeta Gelinas: General surgeon 07/06/2022  Antimicrobials:  Anti-infectives (From admission, onward)    Start     Dose/Rate Route Frequency  Ordered Stop   07/07/22 1215   DAPTOmycin (CUBICIN) 750 mg in sodium chloride 0.9 % IVPB        8 mg/kg  91.8 kg (Adjusted) 130 mL/hr over 30 Minutes Intravenous Daily 07/07/22 1126     07/06/22 1000  piperacillin-tazobactam (ZOSYN) IVPB 3.375 g        3.375 g 12.5 mL/hr over 240 Minutes Intravenous Every 8 hours 07/06/22 0913     07/06/22 0400  vancomycin (VANCOREADY) IVPB 1250 mg/250 mL  Status:  Discontinued        1,250 mg 166.7 mL/hr over 90 Minutes Intravenous 2 times daily 07/06/22 0305 07/07/22 1112   07/06/22 0100  vancomycin (VANCOCIN) IVPB 1000 mg/200 mL premix        1,000 mg 200 mL/hr over 60 Minutes Intravenous  Once 07/06/22 0050 07/06/22 0402   07/06/22 0100  piperacillin-tazobactam (ZOSYN) IVPB 3.375 g        3.375 g 100 mL/hr over 30 Minutes Intravenous  Once 07/06/22 0050 07/06/22 0212   07/05/22 2300  cefTRIAXone (ROCEPHIN) 1 g in sodium chloride 0.9 % 100 mL IVPB        1 g 200 mL/hr over 30 Minutes Intravenous  Once 07/05/22 2248 07/06/22 0139         Subjective: Sitting up in bed.  Denies any chest pain.  No shortness of breath.  He denies any abdominal pain.  Objective: Vitals:   07/07/22 0532 07/07/22 0749 07/07/22 1159 07/07/22 1601  BP: (!) 95/57 107/68 100/69 (!) 92/59  Pulse: 73 65 61 69  Resp: 20 19 19 19   Temp: (!) 97.5 F (36.4 C) (!) 97.5 F (36.4 C) 97.9 F (36.6 C) 97.6 F (36.4 C)  TempSrc: Oral Oral Oral Oral  SpO2: 98% 99% 100% 99%  Weight:      Height:        Intake/Output Summary (Last 24 hours) at 07/07/2022 1735 Last data filed at 07/07/2022 1627 Gross per 24 hour  Intake --  Output 2400 ml  Net -2400 ml   Filed Weights   07/05/22 2047 07/06/22 1143  Weight: 127 kg 127 kg    Examination:  General exam: Appears calm and comfortable. Respiratory system: Clear to auscultation.  No wheezes, no crackles, no rhonchi.  Fair air movement.  Speaking in full sentences.  Respiratory effort normal. Cardiovascular system: S1 & S2 heard, RRR. No JVD, murmurs,  rubs, gallops or clicks. No pedal edema. Gastrointestinal system: Abdomen is nondistended, soft and nontender. No organomegaly or masses felt. Normal bowel sounds heard. Central nervous system: Alert and oriented.  Paraplegia.  No focal neurological deficits. Extremities: Symmetric 5 x 5 power. Skin: No rashes, lesions or ulcers Psychiatry: Judgement and insight appear normal. Mood & affect appropriate.        Data Reviewed: I have personally reviewed following labs and imaging studies  CBC: Recent Labs  Lab 07/05/22 2100 07/06/22 0346 07/07/22 0216  WBC 8.9 8.6 9.5  NEUTROABS 6.6  --  8.0*  HGB 10.3* 10.0* 10.1*  HCT 31.8* 31.0* 30.2*  MCV 87.4 88.8 86.8  PLT 304 285 324    Basic Metabolic Panel: Recent Labs  Lab 07/05/22 2100 07/06/22 0346 07/07/22 0216  NA 134* 136 133*  K 3.9 3.6 3.6  CL 101 105 101  CO2 23 22 22   GLUCOSE 145* 149* 197*  BUN 11 9 10   CREATININE 0.83 0.67 0.71  CALCIUM 8.5* 8.3* 8.6*  MG  --   --  1.8  GFR: Estimated Creatinine Clearance: 137.1 mL/min (by C-G formula based on SCr of 0.71 mg/dL).  Liver Function Tests: Recent Labs  Lab 07/05/22 2100 07/06/22 0346  AST 41 43*  ALT 47* 47*  ALKPHOS 65 60  BILITOT 0.8 0.7  PROT 7.6 7.2  ALBUMIN 2.7* 2.6*    CBG: Recent Labs  Lab 07/07/22 0019 07/07/22 0504 07/07/22 0754 07/07/22 1155 07/07/22 1607  GLUCAP 222* 195* 168* 149* 165*     Recent Results (from the past 240 hour(s))  Urine Culture     Status: Abnormal (Preliminary result)   Collection Time: 07/05/22 10:05 PM   Specimen: Urine, Catheterized  Result Value Ref Range Status   Specimen Description URINE, CATHETERIZED  Final   Special Requests NONE  Final   Culture (A)  Final    >=100,000 COLONIES/mL ESCHERICHIA COLI >=100,000 COLONIES/mL PROTEUS MIRABILIS SUSCEPTIBILITIES TO FOLLOW Performed at [facility], [address] [place], [place] [address]    Report Status PENDING  Incomplete   Aerobic/Anaerobic Culture w Gram Stain (surgical/deep wound)     Status: None (Preliminary result)   Collection Time: 07/06/22 12:53 PM   Specimen: Path fluid; Body Fluid  Result Value Ref Range Status   Specimen Description WOUND  Final   Special Requests SACRAL  Final   Gram Stain   Final    RARE WBC PRESENT, PREDOMINANTLY MONONUCLEAR FEW GRAM POSITIVE COCCI IN PAIRS FEW GRAM NEGATIVE RODS    Culture   Final    TOO YOUNG TO READ Performed at [facility], [address] [place], [place] [address]    Report Status PENDING  Incomplete         Radiology Studies: CT ABDOMEN PELVIS W CONTRAST  Result Date: 07/06/2022 CLINICAL DATA:  Abdominal pain, acute, nonlocalized buttucks cellulitis and concern for abscess EXAM: CT ABDOMEN AND PELVIS WITH CONTRAST TECHNIQUE: Multidetector CT imaging of the abdomen and pelvis was performed using the standard protocol following bolus administration of intravenous contrast. RADIATION DOSE REDUCTION: This exam was performed according to the departmental dose-optimization program which includes automated exposure control, adjustment of the mA and/or kV according to patient size and/or use of iterative reconstruction technique. CONTRAST:  OMNIPAQUE IOHEXOL 300 MG/ML  SOLN COMPARISON:  None Available. FINDINGS: Lower chest: No acute abnormality. Hepatobiliary: No focal liver abnormality. No gallstones, gallbladder wall thickening, or pericholecystic fluid. No biliary dilatation. Pancreas: No focal lesion. Normal pancreatic contour. No surrounding inflammatory changes. No main pancreatic ductal dilatation. Spleen: Normal in size without focal abnormality.  Splenule noted. Adrenals/Urinary Tract: No adrenal nodule bilaterally. Bilateral kidneys enhance symmetrically. Left nephrolithiasis measuring up to 2.9 cm. Associated mild fullness of the left collecting system with peripelvic fat stranding. No frank hydroureteronephrosis. Mild left urothelial  thickening. No left ureterolithiasis. No right nephroureterolithiasis. No right hydronephrosis. The urinary bladder is decompressed with Foley catheter tip and balloon terminating within the lumen. On delayed imaging, there is no urothelial wall thickening and there are no filling defects in the opacified portions of the bilateral collecting systems or ureters. Stomach/Bowel: Stomach is within normal limits. No evidence of bowel wall thickening or dilatation. Few scattered colonic diverticula. Appendix appears normal. Vascular/Lymphatic: Inferior vena cava filter in grossly appropriate position. No abdominal aorta or iliac aneurysm. Mild atherosclerotic plaque of the aorta and its branches. Borderline enlarged bilateral pelvis sidewall lymph nodes. Otherwise no abdominal, pelvic, or inguinal lymphadenopathy. Reproductive: Prostate is unremarkable. Other: No intraperitoneal free fluid. No intraperitoneal free gas. No organized fluid  collection. Musculoskeletal: Left medial gluteal cleft subcutaneus soft tissue emphysema. Associated overlying mild dermal thickening and subcutaneus soft tissue fat stranding. No organized fluid collection. Atrophic musculature. No suspicious lytic or blastic osseous lesions. No acute displaced fracture. Multilevel degenerative changes of the spine. IMPRESSION: 1. Left medial gluteal cleft subcutaneus soft tissue emphysema. Associated overlying mild dermal thickening and subcutaneus soft tissue fat stranding. No organized abscess identified. Findings suggestive of infection. A necrotizing fasciitis cannot be excluded as this is a clinical diagnosis. 2. Nonobstructive left nephrolithiasis measuring up to 2.9 cm with question superimposed left collecting system infection. Correlate with urinalysis. 3. Colonic diverticulosis with no acute diverticulitis. 4. Inferior vena cava filter in appropriate position. 5. Aortic Atherosclerosis (ICD10-I70.0). 6. Borderline enlarged bilateral pelvis  sidewall lymph nodes. Recommend attention on follow-up. Electronically Signed   By: Tish Frederickson M.D.   On: 07/06/2022 00:24   DG Chest 2 View  Result Date: 07/05/2022 CLINICAL DATA:  Fever and chills x1 week. EXAM: CHEST - 2 VIEW COMPARISON:  January 04, 2022 FINDINGS: The heart size and mediastinal contours are within normal limits. Both lungs are clear. Stable postoperative changes are seen within the lower cervical and mid to upper thoracic spine. The visualized skeletal structures are unremarkable. IMPRESSION: No active cardiopulmonary disease. Electronically Signed   By: Aram Candela M.D.   On: 07/05/2022 22:31        Scheduled Meds:  Chlorhexidine Gluconate Cloth  6 each Topical Daily   docusate sodium  100 mg Oral BID   FLUoxetine  20 mg Oral Daily   gabapentin  600 mg Oral TID   insulin aspart  0-9 Units Subcutaneous TID WC   lisinopril  10 mg Oral Daily   Continuous Infusions:  DAPTOmycin (CUBICIN) 750 mg in sodium chloride 0.9 % IVPB 750 mg (07/07/22 1344)   lactated ringers Stopped (07/06/22 0533)   piperacillin-tazobactam (ZOSYN)  IV 3.375 g (07/07/22 1429)     LOS: 1 day    Time spent: 35 minutes    Ramiro Harvest, MD [facility]   To contact the attending provider between 7A-7P or the covering provider during after hours 7P-7A, please log into the web site [email or web address] and access using universal  password for that web site. If you do not have the password, please call the hospital operator.  07/07/2022, 5:35 PM

## 2022-07-07 NOTE — Progress Notes (Signed)
1 Day Post-Op  Subjective: Patient with no complaints this morning.  ROS: See above, otherwise other systems negative  Objective: Vital signs in last 24 hours: Temp:  [97.5 F (36.4 C)-98.5 F (36.9 C)] 97.5 F (36.4 C) (05/26 0749) Pulse Rate:  [65-84] 65 (05/26 0749) Resp:  [10-20] 19 (05/26 0749) BP: (95-126)/(44-71) 107/68 (05/26 0749) SpO2:  [95 %-100 %] 99 % (05/26 0749) Weight:  [098 kg] 127 kg (05/25 1143) Last BM Date : 07/04/22  Intake/Output from previous day: 05/25 0701 - 05/26 0700 In: 850 [I.V.:800; IV Piggyback:50] Out: 2525 [Urine:2500; Blood:25] Intake/Output this shift: Total I/O In: -  Out: 1350 [Urine:1350]  PE: Skin: wound is clean with no purulent drainage or necrotic tissue.  Black tissue is present from cautery.  Sacrum is palpable at base of the wound but still has periosteum covering this.   Lab Results:  Recent Labs    07/06/22 0346 07/07/22 0216  WBC 8.6 9.5  HGB 10.0* 10.1*  HCT 31.0* 30.2*  PLT 285 324   BMET Recent Labs    07/06/22 0346 07/07/22 0216  NA 136 133*  K 3.6 3.6  CL 105 101  CO2 22 22  GLUCOSE 149* 197*  BUN 9 10  CREATININE 0.67 0.71  CALCIUM 8.3* 8.6*   PT/INR Recent Labs    07/06/22 0146  LABPROT 19.0*  INR 1.6*   CMP     Component Value Date/Time   NA 133 (L) 07/07/2022 0216   NA 140 11/26/2018 1123   K 3.6 07/07/2022 0216   CL 101 07/07/2022 0216   CO2 22 07/07/2022 0216   GLUCOSE 197 (H) 07/07/2022 0216   BUN 10 07/07/2022 0216   BUN 10 11/26/2018 1123   CREATININE 0.71 07/07/2022 0216   CALCIUM 8.6 (L) 07/07/2022 0216   PROT 7.2 07/06/2022 0346   PROT 6.8 11/26/2018 1123   ALBUMIN 2.6 (L) 07/06/2022 0346   ALBUMIN 4.0 11/26/2018 1123   AST 43 (H) 07/06/2022 0346   ALT 47 (H) 07/06/2022 0346   ALKPHOS 60 07/06/2022 0346   BILITOT 0.7 07/06/2022 0346   BILITOT 0.2 11/26/2018 1123   GFRNONAA >60 07/07/2022 0216   GFRAA >60 09/25/2019 0434   Lipase     Component Value  Date/Time   LIPASE 37 06/15/2022 1856       Studies/Results: CT ABDOMEN PELVIS W CONTRAST  Result Date: 07/06/2022 CLINICAL DATA:  Abdominal pain, acute, nonlocalized buttucks cellulitis and concern for abscess EXAM: CT ABDOMEN AND PELVIS WITH CONTRAST TECHNIQUE: Multidetector CT imaging of the abdomen and pelvis was performed using the standard protocol following bolus administration of intravenous contrast. RADIATION DOSE REDUCTION: This exam was performed according to the departmental dose-optimization program which includes automated exposure control, adjustment of the mA and/or kV according to patient size and/or use of iterative reconstruction technique. CONTRAST:  OMNIPAQUE IOHEXOL 300 MG/ML  SOLN COMPARISON:  None Available. FINDINGS: Lower chest: No acute abnormality. Hepatobiliary: No focal liver abnormality. No gallstones, gallbladder wall thickening, or pericholecystic fluid. No biliary dilatation. Pancreas: No focal lesion. Normal pancreatic contour. No surrounding inflammatory changes. No main pancreatic ductal dilatation. Spleen: Normal in size without focal abnormality.  Splenule noted. Adrenals/Urinary Tract: No adrenal nodule bilaterally. Bilateral kidneys enhance symmetrically. Left nephrolithiasis measuring up to 2.9 cm. Associated mild fullness of the left collecting system with peripelvic fat stranding. No frank hydroureteronephrosis. Mild left urothelial thickening. No left ureterolithiasis. No right nephroureterolithiasis. No right hydronephrosis. The urinary bladder is decompressed  with Foley catheter tip and balloon terminating within the lumen. On delayed imaging, there is no urothelial wall thickening and there are no filling defects in the opacified portions of the bilateral collecting systems or ureters. Stomach/Bowel: Stomach is within normal limits. No evidence of bowel wall thickening or dilatation. Few scattered colonic diverticula. Appendix appears normal.  Vascular/Lymphatic: Inferior vena cava filter in grossly appropriate position. No abdominal aorta or iliac aneurysm. Mild atherosclerotic plaque of the aorta and its branches. Borderline enlarged bilateral pelvis sidewall lymph nodes. Otherwise no abdominal, pelvic, or inguinal lymphadenopathy. Reproductive: Prostate is unremarkable. Other: No intraperitoneal free fluid. No intraperitoneal free gas. No organized fluid collection. Musculoskeletal: Left medial gluteal cleft subcutaneus soft tissue emphysema. Associated overlying mild dermal thickening and subcutaneus soft tissue fat stranding. No organized fluid collection. Atrophic musculature. No suspicious lytic or blastic osseous lesions. No acute displaced fracture. Multilevel degenerative changes of the spine. IMPRESSION: 1. Left medial gluteal cleft subcutaneus soft tissue emphysema. Associated overlying mild dermal thickening and subcutaneus soft tissue fat stranding. No organized abscess identified. Findings suggestive of infection. A necrotizing fasciitis cannot be excluded as this is a clinical diagnosis. 2. Nonobstructive left nephrolithiasis measuring up to 2.9 cm with question superimposed left collecting system infection. Correlate with urinalysis. 3. Colonic diverticulosis with no acute diverticulitis. 4. Inferior vena cava filter in appropriate position. 5. Aortic Atherosclerosis (ICD10-I70.0). 6. Borderline enlarged bilateral pelvis sidewall lymph nodes. Recommend attention on follow-up. Electronically Signed   By: Tish Frederickson M.D.   On: 07/06/2022 00:24   DG Chest 2 View  Result Date: 07/05/2022 CLINICAL DATA:  Fever and chills x1 week. EXAM: CHEST - 2 VIEW COMPARISON:  January 04, 2022 FINDINGS: The heart size and mediastinal contours are within normal limits. Both lungs are clear. Stable postoperative changes are seen within the lower cervical and mid to upper thoracic spine. The visualized skeletal structures are unremarkable.  IMPRESSION: No active cardiopulmonary disease. Electronically Signed   By: Aram Candela M.D.   On: 07/05/2022 22:31    Anti-infectives: Anti-infectives (From admission, onward)    Start     Dose/Rate Route Frequency Ordered Stop   07/06/22 1000  piperacillin-tazobactam (ZOSYN) IVPB 3.375 g        3.375 g 12.5 mL/hr over 240 Minutes Intravenous Every 8 hours 07/06/22 0913     07/06/22 0400  vancomycin (VANCOREADY) IVPB 1250 mg/250 mL        1,250 mg 166.7 mL/hr over 90 Minutes Intravenous 2 times daily 07/06/22 0305     07/06/22 0100  vancomycin (VANCOCIN) IVPB 1000 mg/200 mL premix        1,000 mg 200 mL/hr over 60 Minutes Intravenous  Once 07/06/22 0050 07/06/22 0402   07/06/22 0100  piperacillin-tazobactam (ZOSYN) IVPB 3.375 g        3.375 g 100 mL/hr over 30 Minutes Intravenous  Once 07/06/22 0050 07/06/22 0212   07/05/22 2300  cefTRIAXone (ROCEPHIN) 1 g in sodium chloride 0.9 % 100 mL IVPB        1 g 200 mL/hr over 30 Minutes Intravenous  Once 07/05/22 2248 07/06/22 0139        Assessment/Plan POD 1, s/p excisional debridement of sacral wound, Dr. Janee Morn 5/25 -NS WD dressing changes BID -cultures with gram + cocci and gram - rods -abx per ID -will follow  FEN - carb mod VTE - may have chemical prophylaxis from our standpoint ID - vanc/zosyn  I reviewed Consultant ID notes, hospitalist notes, last 24 h vitals  and pain scores, last 48 h intake and output, last 24 h labs and trends, and last 24 h imaging results.   LOS: 1 day    Letha Cape , [facility] 07/07/2022, 10:25 AM Please see Amion for pager number during day hours 7:00am-4:30pm or 7:00am -11:30am on weekends

## 2022-07-07 NOTE — Progress Notes (Signed)
ANTICOAGULATION CONSULT NOTE - Initial Consult  Pharmacy Consult for Lovenox Indication:  hx of DVT  No Known Allergies  Patient Measurements: Height: 5' 7.99" (172.7 cm) Weight: 127 kg (280 lb) IBW/kg (Calculated) : 68.38 Heparin Dosing Weight:   Vital Signs: Temp: 97.6 F (36.4 C) (05/26 1601) Temp Source: Oral (05/26 1601) BP: 92/59 (05/26 1601) Pulse Rate: 69 (05/26 1601)  Labs: Recent Labs    07/05/22 2100 07/06/22 0124 07/06/22 0146 07/06/22 0346 07/07/22 0216  HGB 10.3*  --   --  10.0* 10.1*  HCT 31.8*  --   --  31.0* 30.2*  PLT 304  --   --  285 324  APTT  --   --  46*  --   --   LABPROT  --   --  19.0*  --   --   INR  --   --  1.6*  --   --   CREATININE 0.83  --   --  0.67 0.71  CKTOTAL  --  108  --   --   --     Estimated Creatinine Clearance: 137.1 mL/min (by C-G formula based on SCr of 0.71 mg/dL).   Medical History: Past Medical History:  Diagnosis Date   Diabetes mellitus without complication (HCC)    Dyspnea    History of cervical fracture    Hypertension    Paralysis (HCC)    BLE    Medications:  Facility-Administered Medications Prior to Admission  Medication Dose Route Frequency Provider Last Rate Last Admin   lidocaine (XYLOCAINE) 1 % (with pres) injection 5 mL  5 mL Other Once Lovorn, Megan, MD       Medications Prior to Admission  Medication Sig Dispense Refill Last Dose   acetaminophen (TYLENOL) 500 MG tablet Take 500 mg by mouth every 6 (six) hours as needed for mild pain.   07/05/2022 at pm   apixaban (ELIQUIS) 5 MG TABS tablet Take 1 tablet by mouth 2 (two) times daily.   07/05/2022 at 0900   baclofen (LIORESAL) 10 MG tablet TAKE 1 TABLET BY MOUTH 3 TIMES DAILY AS NEEDED FOR MUSCLE SPASMS OR tightness (Patient taking differently: Take 10 mg by mouth 3 (three) times daily as needed for muscle spasms (or tightness).) 90 tablet 5 07/05/2022 at am   BISACODYL LAXATIVE RE Place 1 suppository rectally at bedtime.   07/04/2022 at pm    Carboxymethylcellulose Sodium (ARTIFICIAL TEARS OP) Apply 1-2 drops to eye daily as needed (dry eyes).    UNKNOWN   FLUoxetine (PROZAC) 20 MG capsule Take 1 capsule (20 mg total) by mouth daily. (Patient taking differently: Take 20 mg by mouth at bedtime.) 90 capsule 3 07/04/2022 at pm   furosemide (LASIX) 40 MG tablet Take 1 tablet daily for 6 days, take as needed after that for swelling or weight gain 30 tablet 0 Past Month   gabapentin (NEURONTIN) 600 MG tablet TAKE 1 TABLET BY MOUTH 3 TIMES DAILY (Patient taking differently: Take 600 mg by mouth 3 (three) times daily.) 270 tablet 3 07/05/2022 at am   glipiZIDE (GLUCOTROL) 10 MG tablet Take 1 tablet by mouth 2 (two) times daily.   07/05/2022 at am   HYDROcodone-acetaminophen (NORCO) 5-325 MG tablet Take 1 tablet by mouth 2 (two) times daily as needed for moderate pain. 30 tablet 0 07/03/2022   insulin glargine (LANTUS) 100 UNIT/ML injection Inject 30 Units into the skin at bedtime.   07/04/2022 at pm   lisinopril (ZESTRIL) 10 MG  tablet Take 10 mg by mouth daily.   07/05/2022 at am   senna (SENOKOT) 8.6 MG TABS tablet Take 1 tablet by mouth daily.   07/04/2022   sildenafil (VIAGRA) 100 MG tablet Take 1 tablet (100 mg total) by mouth daily as needed for erectile dysfunction. 30 tablet 3 UNKNOWN   sitaGLIPtin (JANUVIA) 100 MG tablet Take 100 mg by mouth daily.   07/05/2022 at am   blood glucose meter kit and supplies Dispense based on patient and insurance preference. Use up to four times daily as directed. (FOR ICD-10 E10.9, E11.9). 1 each 0    Blood Glucose Monitoring Suppl (PRECISION XTRA) DEVI by Does not apply route.      glucose blood (FREESTYLE TEST STRIPS) test strip 1 Each daily.      Insulin Syringe-Needle U-100 (MAGELLAN INSULIN SAFETY SYR) 30G X 5/16" 1 ML MISC 1 Each by miscellaneous route nightly at bedtime Use with injection of insulin one time at night      Lancets MISC daily.      Scheduled:   Chlorhexidine Gluconate Cloth  6 each Topical  Daily   docusate sodium  100 mg Oral BID   FLUoxetine  20 mg Oral Daily   gabapentin  600 mg Oral TID   insulin aspart  0-9 Units Subcutaneous TID WC   Infusions:   DAPTOmycin (CUBICIN) 750 mg in sodium chloride 0.9 % IVPB 750 mg (07/07/22 1344)   lactated ringers Stopped (07/06/22 0533)   piperacillin-tazobactam (ZOSYN)  IV 3.375 g (07/07/22 1429)    Assessment: Pt was on apixaban for a hx of DVT. It has been on hold due to sacral decubitus ulcers need for debridement. Lovenox has been ordered for bridging.  Scr>1 Hgb 10s Plt wnl  Goal of Therapy:  Anti-Xa level 0.6-1 units/ml 4hrs after LMWH dose given Monitor platelets by anticoagulation protocol: Yes   Plan:  Lovenox 120mg  SQ BID F/u CBC and resume of apixaban  Ulyses Southward, PharmD, BCIDP, AAHIVP, CPP Infectious Disease Pharmacist 07/07/2022 5:40 PM

## 2022-07-07 NOTE — Consult Note (Signed)
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 Bacteriuria in context of neurogenic bladder and chronic Foley catheter  Plan:  Change antibiotics to daptomycin and Zosyn Follow-up surgical cultures Ignore the urine cultures While he does not have exposed bone we may consider more protracted oral antibiotics if we can pin down the organisms and they are S to po abx   Principal Problem:   Cellulitis, gluteal, left Active Problems:   Quadriplegia (HCC)   Presence of IVC filter   Sepsis due to cellulitis (HCC)   DM2 (diabetes mellitus, type 2) (HCC)   History of DVT (deep vein thrombosis)   Acute cystitis with hematuria   Hypertension   Scheduled Meds:  Chlorhexidine Gluconate Cloth  6 each Topical Daily   docusate sodium  100 mg Oral BID   FLUoxetine  20 mg Oral Daily   gabapentin  600 mg Oral TID   insulin aspart  0-9 Units Subcutaneous TID WC   lisinopril  10 mg Oral Daily   Continuous Infusions:  lactated ringers Stopped (07/06/22 0533)   piperacillin-tazobactam (ZOSYN)  IV 3.375 g (07/07/22 0506)   vancomycin 1,250 mg (07/07/22 1005)   PRN Meds:.acetaminophen **OR** acetaminophen, baclofen, bisacodyl, hydrALAZINE, morphine injection, ondansetron **OR** ondansetron (ZOFRAN) IV, oxyCODONE  HPI: [name] is a 57 y.o. male with history significant for diabetes mellitus paraplegia, DVT/PE with IVC filter, neurogenic bladder chronic Foley wheelchair-bound who also has had left fifth ray amputation, who presented to the ER with fevers.  He had developed a decubitus ulcer in his sacral area was examined earlier this month on May 4  when family brought the patient to the ER.  At that time the site was not felt to be overtly infected.  He has had worsening since then and in the context of his fevers was brought to the emergency department.  ET abdomen pelvis that was performed in the ER on the 24th showed left medial gluteal cleft tissue emphysema with overlying dermal thickening and fat stranding concerning for infection.  There also was a nonobstructive left-sided nephrolithiasis observed along with colonic diverticulosis.  Patient was taken to the operating room by general surgery today and underwent irrigation and debridement of the sacral wound with 11 x 6 x 4.45 cm removed.  Cultures were sent though he appears to have already been on vancomycin and zosyn when they were sent.  Gram stain had showed gram-positive cocci and gram-negative rods  His urine had some pyuria and a urine culture was sent though I would have never done this myself given the fact that his sacral decubitus ulcer seem to be the obvious source of his infection and he has a chronic Foley and would undoubtedly have colonization with bacteria.  I will switch him from vancomycin and zosyn to daptomycin and zosyn to avoid nephrotoxicity of former combination.  I have personally spent 80 minutes involved in face-to-face and non-face-to-face activities for this patient on the day of the visit. Professional time spent includes the following activities: Preparing to see the patient (review of tests), Obtaining and/or reviewing separately obtained history (admission/discharge record), Performing a medically appropriate  examination and/or evaluation , Ordering medications/tests/procedures, referring and communicating with other health care professionals, Documenting clinical information in the EMR, Independently interpreting results (not separately reported), Communicating results to the patient/family/caregiver, Counseling and educating the  patient/family/caregiver and Care coordination (not separately reported).    Review of Systems: Review of Systems  Constitutional:  Positive for fever. Negative for chills, diaphoresis, malaise/fatigue and weight loss.  HENT:  Negative for congestion, hearing loss, sore throat and tinnitus.   Eyes:  Negative for blurred vision and double vision.  Respiratory:  Negative for cough, sputum production, shortness of breath and wheezing.   Cardiovascular:  Negative for chest pain, palpitations and leg swelling.  Gastrointestinal:  Negative for abdominal pain, blood in stool, constipation, diarrhea, heartburn, melena, nausea and vomiting.  Genitourinary:  Negative for dysuria, flank pain and hematuria.  Musculoskeletal:  Negative for back pain, falls, joint pain and myalgias.  Skin:  Negative for itching and rash.  Neurological:  Positive for focal weakness and weakness. Negative for dizziness, sensory change, loss of consciousness and headaches.  Endo/Heme/Allergies:  Does not bruise/bleed easily.  Psychiatric/Behavioral:  Negative for depression, memory loss and suicidal ideas. The patient is not nervous/anxious.     Past Medical History:  Diagnosis Date   Diabetes mellitus without complication (HCC)    Dyspnea    History of cervical fracture    Hypertension    Paralysis (HCC)    BLE    Social History   Tobacco Use   Smoking status: Never   Smokeless tobacco: Never  Vaping Use   Vaping Use: Former  Substance Use Topics   Alcohol use: Yes    Comment: social   Drug use: No    Family History  Problem Relation Age of Onset   Diabetes Mother    Diabetes Father    No Known Allergies  OBJECTIVE: Blood pressure 107/68, pulse 65, temperature (!) 97.5 F (36.4 C), temperature source Oral, resp. rate 19, height 5' 7.99" (1.727 m), weight 127 kg, SpO2 99 %.  Physical Exam Constitutional:      Appearance: He is well-developed.  HENT:     Head: Normocephalic and atraumatic.   Eyes:     Conjunctiva/sclera: Conjunctivae normal.  Cardiovascular:     Rate and Rhythm: Normal rate and regular rhythm.  Pulmonary:     Effort: Pulmonary effort is normal. No respiratory distress.     Breath sounds: No wheezing.  Abdominal:     General: There is no distension.     Palpations: Abdomen is soft.  Musculoskeletal:        General: Normal range of motion.     Cervical back: Normal range of motion and neck supple.  Skin:    General: Skin is warm and dry.  Neurological:     Mental Status: He is alert and oriented to person, place, and time.  Psychiatric:        Mood and Affect: Mood normal.        Behavior: Behavior normal.        Thought Content: Thought content normal.        Judgment: Judgment normal.    Sacral wound   07/06/2022:  Barnetta Chapel, PA with [facility] was at the bedside changing dressing and probing wound and could palpate sacrum though bone was not visible.   Lab Results Lab Results  Component Value Date   WBC 9.5 07/07/2022   HGB 10.1 (L) 07/07/2022   HCT 30.2 (L) 07/07/2022   MCV 86.8 07/07/2022  PLT 324 07/07/2022    Lab Results  Component Value Date   CREATININE 0.71 07/07/2022   BUN 10 07/07/2022   NA 133 (L) 07/07/2022   K 3.6 07/07/2022   CL 101 07/07/2022   CO2 22 07/07/2022    Lab Results  Component Value Date   ALT 47 (H) 07/06/2022   AST 43 (H) 07/06/2022   ALKPHOS 60 07/06/2022   BILITOT 0.7 07/06/2022     Microbiology: Recent Results (from the past 240 hour(s))  Urine Culture     Status: Abnormal (Preliminary result)   Collection Time: 07/05/22 10:05 PM   Specimen: Urine, Catheterized  Result Value Ref Range Status   Specimen Description URINE, CATHETERIZED  Final   Special Requests   Final    NONE Performed at [facility], [address] [place], [place] [address]    Culture >=100,000 COLONIES/mL GRAM NEGATIVE RODS (A)  Final   Report Status PENDING  Incomplete  Aerobic/Anaerobic Culture w Gram Stain  (surgical/deep wound)     Status: None (Preliminary result)   Collection Time: 07/06/22 12:53 PM   Specimen: Path fluid; Body Fluid  Result Value Ref Range Status   Specimen Description WOUND  Final   Special Requests SACRAL  Final   Gram Stain   Final    RARE WBC PRESENT, PREDOMINANTLY MONONUCLEAR FEW GRAM POSITIVE COCCI IN PAIRS FEW GRAM NEGATIVE RODS Performed at [facility], [address], [place], [place] [address]    Culture PENDING  Incomplete   Report Status PENDING  Incomplete    Acey Lav, MD [facility] [phone] pager  07/07/2022, 10:38 AM

## 2022-07-08 DIAGNOSIS — N3001 Acute cystitis with hematuria: Secondary | ICD-10-CM | POA: Diagnosis not present

## 2022-07-08 DIAGNOSIS — L0390 Cellulitis, unspecified: Secondary | ICD-10-CM | POA: Diagnosis not present

## 2022-07-08 DIAGNOSIS — E119 Type 2 diabetes mellitus without complications: Secondary | ICD-10-CM | POA: Diagnosis not present

## 2022-07-08 DIAGNOSIS — L03317 Cellulitis of buttock: Secondary | ICD-10-CM | POA: Diagnosis not present

## 2022-07-08 LAB — GLUCOSE, CAPILLARY
Glucose-Capillary: 123 mg/dL — ABNORMAL HIGH (ref 70–99)
Glucose-Capillary: 132 mg/dL — ABNORMAL HIGH (ref 70–99)
Glucose-Capillary: 138 mg/dL — ABNORMAL HIGH (ref 70–99)
Glucose-Capillary: 149 mg/dL — ABNORMAL HIGH (ref 70–99)
Glucose-Capillary: 156 mg/dL — ABNORMAL HIGH (ref 70–99)
Glucose-Capillary: 204 mg/dL — ABNORMAL HIGH (ref 70–99)

## 2022-07-08 LAB — CBC WITH DIFFERENTIAL/PLATELET
Abs Immature Granulocytes: 0.08 10*3/uL — ABNORMAL HIGH (ref 0.00–0.07)
Basophils Absolute: 0.1 10*3/uL (ref 0.0–0.1)
Basophils Relative: 1 %
Eosinophils Absolute: 0.2 10*3/uL (ref 0.0–0.5)
Eosinophils Relative: 3 %
HCT: 29.1 % — ABNORMAL LOW (ref 39.0–52.0)
Hemoglobin: 9.5 g/dL — ABNORMAL LOW (ref 13.0–17.0)
Immature Granulocytes: 1 %
Lymphocytes Relative: 36 %
Lymphs Abs: 2.5 10*3/uL (ref 0.7–4.0)
MCH: 28.9 pg (ref 26.0–34.0)
MCHC: 32.6 g/dL (ref 30.0–36.0)
MCV: 88.4 fL (ref 80.0–100.0)
Monocytes Absolute: 0.4 10*3/uL (ref 0.1–1.0)
Monocytes Relative: 5 %
Neutro Abs: 3.7 10*3/uL (ref 1.7–7.7)
Neutrophils Relative %: 54 %
Platelets: 321 10*3/uL (ref 150–400)
RBC: 3.29 MIL/uL — ABNORMAL LOW (ref 4.22–5.81)
RDW: 13.4 % (ref 11.5–15.5)
WBC: 7 10*3/uL (ref 4.0–10.5)
nRBC: 0 % (ref 0.0–0.2)

## 2022-07-08 LAB — RENAL FUNCTION PANEL
Albumin: 2.4 g/dL — ABNORMAL LOW (ref 3.5–5.0)
Anion gap: 9 (ref 5–15)
BUN: 11 mg/dL (ref 6–20)
CO2: 24 mmol/L (ref 22–32)
Calcium: 8.1 mg/dL — ABNORMAL LOW (ref 8.9–10.3)
Chloride: 103 mmol/L (ref 98–111)
Creatinine, Ser: 0.66 mg/dL (ref 0.61–1.24)
GFR, Estimated: >60 mL/min (ref >60)
Glucose, Bld: 128 mg/dL — ABNORMAL HIGH (ref 70–99)
Phosphorus: 3.4 mg/dL (ref 2.5–4.6)
Potassium: 3.4 mmol/L — ABNORMAL LOW (ref 3.5–5.1)
Sodium: 136 mmol/L (ref 135–145)

## 2022-07-08 LAB — URINE CULTURE: Culture: >=100000 — AB

## 2022-07-08 LAB — CK: Total CK: 57 U/L (ref 49–397)

## 2022-07-08 LAB — HEPATITIS B SURFACE ANTIGEN: Hepatitis B Surface Ag: NONREACTIVE

## 2022-07-08 LAB — MAGNESIUM: Magnesium: 1.8 mg/dL (ref 1.7–2.4)

## 2022-07-08 LAB — HEPATITIS C ANTIBODY: HCV Ab: NONREACTIVE

## 2022-07-08 LAB — C-REACTIVE PROTEIN: CRP: 7.8 mg/dL — ABNORMAL HIGH (ref <1.0)

## 2022-07-08 LAB — SEDIMENTATION RATE: Sed Rate: 115 mm/hr — ABNORMAL HIGH (ref 0–16)

## 2022-07-08 MED ORDER — MAGNESIUM SULFATE 2 GM/50ML IV SOLN
2.0000 g | Freq: Once | INTRAVENOUS | Status: AC
  Administered 2022-07-08: 2 g via INTRAVENOUS
  Filled 2022-07-08: qty 50

## 2022-07-08 MED ORDER — POTASSIUM CHLORIDE CRYS ER 10 MEQ PO TBCR
40.0000 meq | EXTENDED_RELEASE_TABLET | Freq: Once | ORAL | Status: AC
  Administered 2022-07-08: 40 meq via ORAL
  Filled 2022-07-08: qty 4

## 2022-07-08 MED ORDER — ALBUMIN HUMAN 25 % IV SOLN
25.0000 g | Freq: Once | INTRAVENOUS | Status: AC
  Administered 2022-07-08: 25 g via INTRAVENOUS
  Filled 2022-07-08: qty 100

## 2022-07-08 NOTE — Progress Notes (Signed)
Cultures from deep ulcer in back with up to coccus consult Batus and rare Staphylococcus epidermidis.  Gram-negative rod seen on Gram stain but no gram-negative rods of growing aerobically urine which I wish had not been cultured in the first place is growing ESBL and proteus  I am keeping him on daptomycin and zosyn for now while we followup sensis from operative cultures  He should be in contact precautions for ESBL  New ID team here tomorrow.

## 2022-07-08 NOTE — Progress Notes (Signed)
PROGRESS NOTE    [name]  WUJ:[id number] DOB: 1965-10-13 DOA: 07/05/2022 PCP: System, Provider Not In    Chief Complaint  Patient presents with   Fever    Brief Narrative: Is a 57 year old gentleman history of diabetes, paraplegia, PE/DVT status post IVC filter, neurogenic bladder with chronic Foley wheelchair-bound presented to the ED with fevers.  Workup concerning for a infected decubitus ulcer.  CT abdomen and pelvis performed with left medial gluteal cleft tissue emphysema with overlying dermal thickening and fat stranding concerning for infection.  Known obstructive left-sided nephrolithiasis with colonic diverticulosis.  General surgery consulted and patient underwent irrigation and debridement of sacral wound with necrotic purulent tissue removed and cultures sent.  Patient placed empirically on IV vancomycin and Zosyn.  ID also consulted.   Assessment & Plan:   Principal Problem:   Cellulitis, gluteal, left Active Problems:   Quadriplegia (HCC)   Presence of IVC filter   Sepsis due to cellulitis (HCC)   DM2 (diabetes mellitus, type 2) (HCC)   History of DVT (deep vein thrombosis)   Acute cystitis with hematuria   Hypertension   Paraplegia (HCC)  #1 infected sacral decubitus ulcer -Status post excisional debridement of sacral wound per Dr. Violeta Gelinas 07/06/2022 with necrotic purulent tissue removed, cultures pending. -Preliminary cultures predominantly mononuclear with few gram-positive cocci in pairs, few gram-negative rods. -Patient was on IV vancomycin, IV Zosyn. -ID consulted patient seen in consultation by Dr. Daiva Eves and IV vancomycin discontinued and changed to IV daptomycin and recommending continuation of Zosyn pending surgical cultures. -Per ID patient with no exposed bone however recommending a protracted oral antibiotics if organisms can be pinned down and are sensitive to oral antibiotics. -ID and general surgery following and appreciate input and  recommendations.  2.?  UTI versus bacteriuria/colonization -Patient with chronic indwelling Foley catheter with paraplegia. -Urinalysis done with large leukocytes, nitrite negative, many bacteria, WBC > 50.  -Urine cultures with > 100,000 colonies of ESBL E. coli and Proteus Mirabilis. -Patient seen in consultation by ID who feel could likely be a colonization in the patient with chronic indwelling Foley catheter and few patient source of infection more from the sacral decubitus ulcer. -Patient empirically on IV antibiotics. -ID following.  3.  History of DVT -Continue full dose Lovenox while holding Eliquis in case patient needs further surgical debridement.    4.  Sepsis secondary to sacral decubitus ulcer -Patient on admission met criteria for sepsis due to tachycardia tachypnea and source of infection. -Status post I&D with preliminary surgical cultures few Streptococcus constellatus, Staphylococcus epidermis -Continue IV antibiotics.  5.  History of IVC filter -Patient high risk for DVT and PE. -Was on Eliquis which was held in anticipation of procedure. -See #3.  6.  Diabetes mellitus type 2 -Hemoglobin A1c 7.3 (08/06/2021) -Repeat hemoglobin A1c pending. -CBG 120 this morning. -Continue to hold home oral hypoglycemic agents as well as long-acting insulin.  -SSI.    7.  Hypertension -BP soft. -Continue to hold lisinopril.   -IV albumin x 1. -Saline lock IV fluids.     DVT prophylaxis: Full dose Lovenox Code Status: Full Family Communication: Updated patient and wife at bedside. Disposition: TBD  Status is: Inpatient Remains inpatient appropriate because: Severity of illness   Consultants:  General surgery: Dr. Sheliah Hatch 07/06/2022 ID: Dr. Daiva Eves 07/07/2022  Procedures:  CT abdomen and pelvis 07/05/2022 Chest x-ray 07/05/2022 Irrigation and debridement sacral wound 11 x 6 x 4.5 cm per Dr. Violeta Gelinas: General surgeon  07/06/2022  Antimicrobials:   Anti-infectives (From admission, onward)    Start     Dose/Rate Route Frequency Ordered Stop   07/07/22 1215  DAPTOmycin (CUBICIN) 750 mg in sodium chloride 0.9 % IVPB        8 mg/kg  91.8 kg (Adjusted) 130 mL/hr over 30 Minutes Intravenous Daily 07/07/22 1126     07/06/22 1000  piperacillin-tazobactam (ZOSYN) IVPB 3.375 g        3.375 g 12.5 mL/hr over 240 Minutes Intravenous Every 8 hours 07/06/22 0913     07/06/22 0400  vancomycin (VANCOREADY) IVPB 1250 mg/250 mL  Status:  Discontinued        1,250 mg 166.7 mL/hr over 90 Minutes Intravenous 2 times daily 07/06/22 0305 07/07/22 1112   07/06/22 0100  vancomycin (VANCOCIN) IVPB 1000 mg/200 mL premix        1,000 mg 200 mL/hr over 60 Minutes Intravenous  Once 07/06/22 0050 07/06/22 0402   07/06/22 0100  piperacillin-tazobactam (ZOSYN) IVPB 3.375 g        3.375 g 100 mL/hr over 30 Minutes Intravenous  Once 07/06/22 0050 07/06/22 0212   07/05/22 2300  cefTRIAXone (ROCEPHIN) 1 g in sodium chloride 0.9 % 100 mL IVPB        1 g 200 mL/hr over 30 Minutes Intravenous  Once 07/05/22 2248 07/06/22 0139         Subjective: Sitting up watching television.  Wife at bedside.  No chest pain.  No shortness of breath.  No abdominal pain.  Overall feels well.   Objective: Vitals:   07/07/22 2003 07/07/22 2220 07/08/22 0405 07/08/22 0727  BP: (!) 81/48 103/62 101/69 (!) 95/55  Pulse: 67 64 61 69  Resp: 18  17 18   Temp: 98 F (36.7 C)  98 F (36.7 C) 97.7 F (36.5 C)  TempSrc: Oral  Oral Oral  SpO2: 97%  100% 98%  Weight:      Height:        Intake/Output Summary (Last 24 hours) at 07/08/2022 1215 Last data filed at 07/08/2022 0506 Gross per 24 hour  Intake --  Output 1400 ml  Net -1400 ml    Filed Weights   07/05/22 2047 07/06/22 1143  Weight: 127 kg 127 kg    Examination:  General exam: NAD. Respiratory system: CTAB.  No wheezes, no crackles, no rhonchi.  Fair air movement.  Speaking in full sentences.  Normal respiratory  effort.   Cardiovascular system: Regular rate rhythm no murmurs rubs or gallops.  No JVD.  No lower extremity edema. Gastrointestinal system: Abdomen is soft, nontender, nondistended, positive bowel sounds.  No rebound.  No guarding.  Central nervous system: Alert and oriented.  Paraplegia.  No focal neurological deficits. Extremities: Symmetric 5 x 5 power. Skin: No rashes, lesions or ulcers Psychiatry: Judgement and insight appear normal. Mood & affect appropriate.        Data Reviewed: I have personally reviewed following labs and imaging studies  CBC: Recent Labs  Lab 07/05/22 2100 07/06/22 0346 07/07/22 0216 07/08/22 0657  WBC 8.9 8.6 9.5 7.0  NEUTROABS 6.6  --  8.0* 3.7  HGB 10.3* 10.0* 10.1* 9.5*  HCT 31.8* 31.0* 30.2* 29.1*  MCV 87.4 88.8 86.8 88.4  PLT 304 285 324 321     Basic Metabolic Panel: Recent Labs  Lab 07/05/22 2100 07/06/22 0346 07/07/22 0216 07/08/22 0657  NA 134* 136 133* 136  K 3.9 3.6 3.6 3.4*  CL 101 105 101 103  CO2 23 22 22 24   GLUCOSE 145* 149* 197* 128*  BUN 11 9 10 11   CREATININE 0.83 0.67 0.71 0.66  CALCIUM 8.5* 8.3* 8.6* 8.1*  MG  --   --  1.8 1.8  PHOS  --   --   --  3.4     GFR: Estimated Creatinine Clearance: 137.1 mL/min (by C-G formula based on SCr of 0.66 mg/dL).  Liver Function Tests: Recent Labs  Lab 07/05/22 2100 07/06/22 0346 07/08/22 0657  AST 41 43*  --   ALT 47* 47*  --   ALKPHOS 65 60  --   BILITOT 0.8 0.7  --   PROT 7.6 7.2  --   ALBUMIN 2.7* 2.6* 2.4*     CBG: Recent Labs  Lab 07/07/22 1607 07/07/22 1950 07/08/22 0032 07/08/22 0403 07/08/22 0727  GLUCAP 165* 136* 156* 132* 123*      Recent Results (from the past 240 hour(s))  Urine Culture     Status: Abnormal   Collection Time: 07/05/22 10:05 PM   Specimen: Urine, Catheterized  Result Value Ref Range Status   Specimen Description URINE, CATHETERIZED  Final   Special Requests   Final    NONE Performed at [facility],  [address], [place], [place] [address]    Culture (A)  Final    >=100,000 COLONIES/mL ESCHERICHIA COLI >=100,000 COLONIES/mL PROTEUS MIRABILIS Confirmed Extended Spectrum Beta-Lactamase Producer (ESBL).  In bloodstream infections from ESBL organisms, carbapenems are preferred over piperacillin/tazobactam. They are shown to have a lower risk of mortality.    Report Status 07/08/2022 FINAL  Final   Organism ID, Bacteria ESCHERICHIA COLI (A)  Final   Organism ID, Bacteria PROTEUS MIRABILIS (A)  Final      Susceptibility   Escherichia coli - MIC*    AMPICILLIN >=32 RESISTANT Resistant     CEFAZOLIN >=64 RESISTANT Resistant     CEFEPIME 16 RESISTANT Resistant     CEFTRIAXONE >=64 RESISTANT Resistant     CIPROFLOXACIN >=4 RESISTANT Resistant     GENTAMICIN <=1 SENSITIVE Sensitive     IMIPENEM <=0.25 SENSITIVE Sensitive     NITROFURANTOIN 128 RESISTANT Resistant     TRIMETH/SULFA <=20 SENSITIVE Sensitive     AMPICILLIN/SULBACTAM 8 SENSITIVE Sensitive     PIP/TAZO <=4 SENSITIVE Sensitive     * >=100,000 COLONIES/mL ESCHERICHIA COLI   Proteus mirabilis - MIC*    AMPICILLIN <=2 SENSITIVE Sensitive     CEFAZOLIN <=4 SENSITIVE Sensitive     CEFEPIME <=0.12 SENSITIVE Sensitive     CEFTRIAXONE <=0.25 SENSITIVE Sensitive     CIPROFLOXACIN <=0.25 SENSITIVE Sensitive     GENTAMICIN <=1 SENSITIVE Sensitive     IMIPENEM 4 SENSITIVE Sensitive     NITROFURANTOIN RESISTANT Resistant     TRIMETH/SULFA >=320 RESISTANT Resistant     AMPICILLIN/SULBACTAM <=2 SENSITIVE Sensitive     PIP/TAZO <=4 SENSITIVE Sensitive     * >=100,000 COLONIES/mL PROTEUS MIRABILIS  Aerobic/Anaerobic Culture w Gram Stain (surgical/deep wound)     Status: None (Preliminary result)   Collection Time: 07/06/22 12:53 PM   Specimen: Path fluid; Body Fluid  Result Value Ref Range Status   Specimen Description WOUND  Final   Special Requests SACRAL  Final   Gram Stain   Final    RARE WBC PRESENT, PREDOMINANTLY MONONUCLEAR FEW  GRAM POSITIVE COCCI IN PAIRS FEW GRAM NEGATIVE RODS    Culture   Final    FEW STREPTOCOCCUS CONSTELLATUS Beta hemolytic streptococci are predictably  susceptible to penicillin and other beta lactams. Susceptibility testing not routinely performed. RARE STAPHYLOCOCCUS EPIDERMIDIS HOLDING FOR POSSIBLE ANAEROBE Performed at [facility], [address], [place], [place] [address]    Report Status PENDING  Incomplete         Radiology Studies: No results found.      Scheduled Meds:  Chlorhexidine Gluconate Cloth  6 each Topical Daily   docusate sodium  100 mg Oral BID   enoxaparin (LOVENOX) injection  120 mg Subcutaneous BID   FLUoxetine  20 mg Oral Daily   gabapentin  600 mg Oral TID   insulin aspart  0-9 Units Subcutaneous TID WC   Continuous Infusions:  DAPTOmycin (CUBICIN) 750 mg in sodium chloride 0.9 % IVPB 750 mg (07/07/22 1344)   piperacillin-tazobactam (ZOSYN)  IV 3.375 g (07/08/22 0539)     LOS: 2 days    Time spent: 35 minutes    Ramiro Harvest, MD [facility]   To contact the attending provider between 7A-7P or the covering provider during after hours 7P-7A, please log into the web site [email or web address] and access using universal [place] password for that web site. If you do not have the password, please call the hospital operator.  07/08/2022, 12:15 PM

## 2022-07-08 NOTE — Evaluation (Addendum)
Occupational Therapy Evaluation Patient Details Name: [name] MRN: [id number] DOB: [birth year] Today's Date: 07/08/2022   History of Present Illness Patient is a 57 y.o. male presented to the ED with fevers; workup concerning for a infected decubitus ulcer. General surgery consulted and patient underwent irrigation and debridement of sacral wound with necrotic purulent tissue removed and cultures sent.  Patient placed empirically on IV vancomycin and Zosyn.  ID also consulted. PMH significant for DM, C7 spinal fracture s/p surgery with paraplegia (2020), PE/DVT s/p IVC filter, neurogenic bladder with chronic Foley, 5th ray amputation.   Clinical Impression   Interpreter used during session. Per wife, pt has a regular hospital bed at home without an air mattress and uses a "donut" cushion in his w/c.  There are apparent problems with their home equipment  - will need equipment as indicated below. At baseline, wife assists with bathing/dressing @ bed level and uses a hoyer to transfer [place] into the w/c where he stays all day until he goes to bed at night. Current order for air mattress on acute (PT called portable as order previously written). B Prafos ordered. Acute OT will follow to educate pt/wife on use of AD/AE for ADL and mobility to increase independence and reduce risk of pressure.      Recommendations for follow up therapy are one component of a multi-disciplinary discharge planning process, led by the attending physician.  Recommendations may be updated based on patient status, additional functional criteria and insurance authorization.   Assistance Recommended at Discharge    Patient can return home with the following A lot of help with walking and/or transfers;A lot of help with bathing/dressing/bathroom    Functional Status Assessment  Patient has had a recent decline in their functional status and demonstrates the ability to make significant improvements in function in a  reasonable and predictable amount of time.  Equipment Recommendations  Hospital bed with air mattress overaly; may need fluidotherapy bed per WOC nurse recommendation; hoyer - current hoyer is broken; new cushion for wc (will have Rep from [employer] assess)   Recommendations for Other Services       Precautions / Restrictions Precautions Precautions: Fall Precaution Comments: sacral wound      Mobility Bed Mobility Overal bed mobility: Needs Assistance Bed Mobility: Rolling Rolling: Mod assist, +2 for safety/equipment         General bed mobility comments: heavy use of rail    Transfers                   General transfer comment: requires lift      Balance                                           ADL either performed or assessed with clinical judgement   ADL Overall ADL's : Needs assistance/impaired     Grooming: Set up   Upper Body Bathing: Minimal assistance   Lower Body Bathing: Maximal assistance   Upper Body Dressing : Moderate assistance   Lower Body Dressing: Maximal assistance;Bed level       Toileting- Clothing Manipulation and Hygiene: Total assistance       Functional mobility during ADLs: Moderate assistance;+2 for safety/equipment (rolling) General ADL Comments: At baseline regarding ADL however pt is not using leg lifters or other assistive devices to help wtih ADL/pressure relief     Vision  Perception     Praxis      Pertinent Vitals/Pain       Hand Dominance Right   Extremity/Trunk Assessment Upper Extremity Assessment Upper Extremity Assessment: Overall WFL for tasks assessed   Lower Extremity Assessment Lower Extremity Assessment: Defer to PT evaluation (spasticity BLE)   Cervical / Trunk Assessment Cervical / Trunk Assessment: Other exceptions (paraplegic)   Communication Communication Communication: Prefers language other than English (spanish)   Cognition                                              General Comments  sacral wound    Exercises     Shoulder Instructions      Home Living Family/patient expects to be discharged to:: Private residence Living Arrangements: Spouse/significant other Available Help at Discharge: Family;Available 24 hours/day Type of Home: House Home Access: Ramped entrance     Home Layout: Able to live on main level with bedroom/bathroom               Home Equipment: Wheelchair - power;Shower seat   Additional Comments: wc accessible; pt does not go into bathroom      Prior Functioning/Environment Prior Level of Function : Needs assist       Physical Assist : Mobility (physical);ADLs (physical) Mobility (physical): Bed mobility;Transfers (uses hoyer; sits in wc majority of the day) ADLs (physical): Bathing;Dressing;Toileting;IADLs (pt does as much as he can after wife sets him up; difficulty rolling at baseline; bathing/dressing done @ bed level)   ADLs Comments: Wife uses the hoyer to lift [place] to the wc then he stays in the wc all day adn takes turns lifting his legs up inthewc. Uses the bathroom in the bed @ night/wife does suppository/bowel program; uses a brief during teh dayadn wife does in/out catheter q 4 hrs        OT Problem List: Decreased strength;Decreased range of motion;Decreased knowledge of use of DME or AE;Decreased knowledge of precautions;Obesity;Impaired sensation;Impaired tone      OT Treatment/Interventions: Self-care/ADL training;Therapeutic exercise;DME and/or AE instruction;Therapeutic activities;Patient/family education    OT Goals(Current goals can be found in the care plan section) Acute Rehab OT Goals Patient Stated Goal: better management of skin OT Goal Formulation: With patient/family Time For Goal Achievement: 07/22/22 Potential to Achieve Goals: Good ADL Goals Pt Will Perform Lower Body Bathing: with mod assist;with adaptive equipment Pt/caregiver  will Perform Home Exercise Program: Increased strength;Both right and left upper extremity;With theraband;Independently Additional ADL Goal #1: Pt will use leg loops/rail loops to help with rolling side - side and bed mobility for pressure relief  OT Frequency: Min 2X/week    Co-evaluation PT/OT/SLP Co-Evaluation/Treatment: Yes Reason for Co-Treatment: To address functional/ADL transfers   OT goals addressed during session: ADL's and self-care      AM-PAC OT "6 Clicks" Daily Activity     Outcome Measure Help from another person eating meals?: None Help from another person taking care of personal grooming?: A Little Help from another person toileting, which includes using toliet, bedpan, or urinal?: Total Help from another person bathing (including washing, rinsing, drying)?: A Lot Help from another person to put on and taking off regular upper body clothing?: A Lot Help from another person to put on and taking off regular lower body clothing?: Total 6 Click Score: 13   End of Session Nurse Communication: Mobility status;Other (  comment) (need for B Prafo and air mattress)  Activity Tolerance: Patient tolerated treatment well Patient left: in bed;with call bell/phone within reach;with family/visitor present  OT Visit Diagnosis: Other abnormalities of gait and mobility (R26.89);Muscle weakness (generalized) (M62.81)                Time: 4098-1191 OT Time Calculation (min): 43 min Charges:  OT General Charges $OT Visit: 1 Visit OT Evaluation $OT Eval Moderate Complexity: 1 Mod  Gaylord Seydel, OT/L   Acute OT Clinical Specialist Acute Rehabilitation Services Pager [phone] Office [phone]   [facility] 07/08/2022, 3:45 PM

## 2022-07-08 NOTE — Progress Notes (Signed)
Orthopedic Tech Progress Note Patient Details:  [name] [birth year] [id number]  Ortho Devices Type of Ortho Device: Prafo boot/shoe Ortho Device/Splint Location: BLE Ortho Device/Splint Interventions: Application, Ordered   Post Interventions Patient Tolerated: Well  [name] 07/08/2022, 4:33 PM

## 2022-07-08 NOTE — Evaluation (Signed)
Physical Therapy Evaluation Patient Details Name: [name] MRN: [id number] DOB: Jul 20, 1965 Today's Date: 07/08/2022  History of Present Illness  Patient is a 57 y.o. male presented to the ED with fevers; workup concerning for a infected decubitus ulcer. General surgery consulted and patient underwent irrigation and debridement of sacral wound with necrotic purulent tissue removed and cultures sent.  Patient placed empirically on IV vancomycin and Zosyn.  ID also consulted. PMH significant for DM, C7 spinal fracture s/p surgery no with paraplegia, PE/DVT s/p IVC filter, neurogenic bladder with chronic Foley, 5th ray amputation.   Clinical Impression  [name] is 57 y.o. male admitted with above HPI and diagnosis. Patient is currently limited by functional impairments below (see PT problem list). Patient lives with spouse and is dependent for all mobility and ADLs at bed level with assist from wife at baseline using hoyer lift for bed<>WC transfers. Patient will benefit from continued skilled PT interventions to address impairments and progress mobility, recommending HH follow up for DME update as hospital bed will need air overlay due to new decubitus wound, pt will need follow up with [employer] for updated WC cushion (will contact rep Harrie Jeans from [employer] @ [phone]). Current order for air mattress on acute however pt on standard hospital bed (PT called portable for delivery as order already in chart). Bil PRAFO's ordered to protect bil heels for off-loading and ROM maintenance.  Acute PT will follow and progress as able.        Recommendations for follow up therapy are one component of a multi-disciplinary discharge planning process, led by the attending physician.  Recommendations may be updated based on patient status, additional functional criteria and insurance authorization.  Follow Up Recommendations       Assistance Recommended at Discharge Frequent or constant  Supervision/Assistance  Patient can return home with the following  Two people to help with walking and/or transfers;A lot of help with bathing/dressing/bathroom;Assistance with cooking/housework;Direct supervision/assist for medications management;Assist for transportation;Help with stairs or ramp for entrance    Equipment Recommendations Hospital bed;Other (comment) (HH for DME evaluation needs; new mechanical lift, hospital bed with air mattress overlay, [employer] WC consult for new pressure relieving air cushion)  Recommendations for Other Services       Functional Status Assessment Patient has had a recent decline in their functional status and demonstrates the ability to make significant improvements in function in a reasonable and predictable amount of time.     Precautions / Restrictions Precautions Precautions: Fall Precaution Comments: sacral wound Restrictions Weight Bearing Restrictions: No      Mobility  Bed Mobility   Bed Mobility: Rolling Rolling: Mod assist, +2 for safety/equipment         General bed mobility comments: Max assist for positioning LE's to facilitate lower trunk rolling. pt reaching UE for bed rail and using momentum to initiate upper trunk rolling.    Transfers                   General transfer comment: Total assist for mechanical lift at baseline    Ambulation/Gait                  Stairs            Wheelchair Mobility    Modified Rankin (Stroke Patients Only)       Balance  Pertinent Vitals/Pain Pain Assessment Pain Assessment: No/denies pain    Home Living Family/patient expects to be discharged to:: Private residence Living Arrangements: Spouse/significant other Available Help at Discharge: Family;Available 24 hours/day Type of Home: House Home Access: Ramped entrance       Home Layout: Able to live on main level with  bedroom/bathroom Home Equipment: Wheelchair - power;Shower seat Additional Comments: wc accessible; pt does not go into bathroom    Prior Function Prior Level of Function : Needs assist       Physical Assist : Mobility (physical);ADLs (physical) Mobility (physical): Bed mobility;Transfers (uses hoyer; sits in wc majority of the day) ADLs (physical): Bathing;Dressing;Toileting;IADLs (pt does as much as he can after wife sets him up; difficulty rolling at baseline; bathing/dressing done @ bed level)   ADLs Comments: Wife uses the hoyer to lift [place] to the wc then he stays in the wc all day adn takes turns lifting his legs up inthewc. Uses the bathroom in the bed @ night/wife does suppository/bowel program; uses a brief during teh dayadn wife does in/out catheter q 4 hrs     Hand Dominance   Dominant Hand: Right    Extremity/Trunk Assessment   Upper Extremity Assessment Upper Extremity Assessment: Overall WFL for tasks assessed    Lower Extremity Assessment Lower Extremity Assessment: Defer to PT evaluation (spasticity BLE) RLE Deficits / Details: bil LE spasticity with ROM restrcition for hip abduction/ext rotation; bil ankles can achieve neutral (pt wears PRAFOS at home) LLE Deficits / Details: bil LE spasticity with ROM restrcition for hip abduction/ext rotation; bil ankles can achieve neutral (pt wears PRAFOS at home)    Cervical / Trunk Assessment Cervical / Trunk Assessment: Other exceptions (paraplegic)  Communication   Communication: Prefers language other than English (spanish)  Cognition Arousal/Alertness: Awake/alert Behavior During Therapy: WFL for tasks assessed/performed Overall Cognitive Status: Within Functional Limits for tasks assessed                                          General Comments General comments (skin integrity, edema, etc.): sacral wound    Exercises     Assessment/Plan    PT Assessment Patient needs continued PT  services  PT Problem List Decreased strength;Decreased range of motion;Decreased activity tolerance;Decreased balance;Decreased mobility;Decreased knowledge of use of DME;Impaired sensation;Impaired tone;Decreased skin integrity       PT Treatment Interventions DME instruction;Functional mobility training;Therapeutic activities;Therapeutic exercise;Balance training;Neuromuscular re-education;Cognitive remediation;Patient/family education;Wheelchair mobility training;Manual techniques;Modalities    PT Goals (Current goals can be found in the Care Plan section)  Acute Rehab PT Goals Patient Stated Goal: get equipment updated and increased assist with home aide, wound to heal PT Goal Formulation: With patient/family Time For Goal Achievement: 07/22/22 Potential to Achieve Goals: Fair Additional Goals Additional Goal #1: LE ROM program/HEP for improved bed mobility    Frequency Min 2X/week     Co-evaluation PT/OT/SLP Co-Evaluation/Treatment: Yes Reason for Co-Treatment: To address functional/ADL transfers PT goals addressed during session: Mobility/safety with mobility;Strengthening/ROM OT goals addressed during session: ADL's and self-care       AM-PAC PT "6 Clicks" Mobility  Outcome Measure Help needed turning from your back to your side while in a flat bed without using bedrails?: Total Help needed moving from lying on your back to sitting on the side of a flat bed without using bedrails?: Total Help needed moving to and from a bed  to a chair (including a wheelchair)?: Total Help needed standing up from a chair using your arms (e.g., wheelchair or bedside chair)?: Total Help needed to walk in hospital room?: Total Help needed climbing 3-5 steps with a railing? : Total 6 Click Score: 6    End of Session   Activity Tolerance: Patient tolerated treatment well Patient left: in bed;with call bell/phone within reach;with family/visitor present;with bed alarm set;with SCD's  reapplied Nurse Communication: Mobility status;Other (comment) (Secretary called [employer] for air mattress.) PT Visit Diagnosis: Muscle weakness (generalized) (M62.81);Other abnormalities of gait and mobility (R26.89);Other symptoms and signs involving the nervous system (Z61.096)    Time: 0454-0981 PT Time Calculation (min) (ACUTE ONLY): 51 min   Charges:   PT Evaluation $PT Eval Moderate Complexity: 1 Mod          Wynn Maudlin, DPT Acute Rehabilitation Services Office [phone]  07/08/22 3:58 PM

## 2022-07-09 ENCOUNTER — Encounter (HOSPITAL_BASED_OUTPATIENT_CLINIC_OR_DEPARTMENT_OTHER): Payer: 59 | Admitting: General Surgery

## 2022-07-09 DIAGNOSIS — L0390 Cellulitis, unspecified: Secondary | ICD-10-CM | POA: Diagnosis not present

## 2022-07-09 DIAGNOSIS — L03317 Cellulitis of buttock: Secondary | ICD-10-CM | POA: Diagnosis not present

## 2022-07-09 DIAGNOSIS — N3001 Acute cystitis with hematuria: Secondary | ICD-10-CM | POA: Diagnosis not present

## 2022-07-09 DIAGNOSIS — E119 Type 2 diabetes mellitus without complications: Secondary | ICD-10-CM | POA: Diagnosis not present

## 2022-07-09 LAB — CBC WITH DIFFERENTIAL/PLATELET
Abs Immature Granulocytes: 0.15 10*3/uL — ABNORMAL HIGH (ref 0.00–0.07)
Basophils Absolute: 0.1 10*3/uL (ref 0.0–0.1)
Basophils Relative: 1 %
Eosinophils Absolute: 0.3 10*3/uL (ref 0.0–0.5)
Eosinophils Relative: 4 %
HCT: 30.6 % — ABNORMAL LOW (ref 39.0–52.0)
Hemoglobin: 10.1 g/dL — ABNORMAL LOW (ref 13.0–17.0)
Immature Granulocytes: 2 %
Lymphocytes Relative: 35 %
Lymphs Abs: 2.7 10*3/uL (ref 0.7–4.0)
MCH: 28.5 pg (ref 26.0–34.0)
MCHC: 33 g/dL (ref 30.0–36.0)
MCV: 86.2 fL (ref 80.0–100.0)
Monocytes Absolute: 0.5 10*3/uL (ref 0.1–1.0)
Monocytes Relative: 6 %
Neutro Abs: 4.1 10*3/uL (ref 1.7–7.7)
Neutrophils Relative %: 52 %
Platelets: 355 10*3/uL (ref 150–400)
RBC: 3.55 MIL/uL — ABNORMAL LOW (ref 4.22–5.81)
RDW: 13.4 % (ref 11.5–15.5)
WBC: 7.8 10*3/uL (ref 4.0–10.5)
nRBC: 0 % (ref 0.0–0.2)

## 2022-07-09 LAB — BASIC METABOLIC PANEL
Anion gap: 9 (ref 5–15)
BUN: 8 mg/dL (ref 6–20)
CO2: 24 mmol/L (ref 22–32)
Calcium: 8.5 mg/dL — ABNORMAL LOW (ref 8.9–10.3)
Chloride: 102 mmol/L (ref 98–111)
Creatinine, Ser: 0.64 mg/dL (ref 0.61–1.24)
GFR, Estimated: >60 mL/min (ref >60)
Glucose, Bld: 139 mg/dL — ABNORMAL HIGH (ref 70–99)
Potassium: 3.7 mmol/L (ref 3.5–5.1)
Sodium: 135 mmol/L (ref 135–145)

## 2022-07-09 LAB — AEROBIC/ANAEROBIC CULTURE W GRAM STAIN (SURGICAL/DEEP WOUND)

## 2022-07-09 LAB — GLUCOSE, CAPILLARY
Glucose-Capillary: 125 mg/dL — ABNORMAL HIGH (ref 70–99)
Glucose-Capillary: 127 mg/dL — ABNORMAL HIGH (ref 70–99)
Glucose-Capillary: 130 mg/dL — ABNORMAL HIGH (ref 70–99)
Glucose-Capillary: 162 mg/dL — ABNORMAL HIGH (ref 70–99)
Glucose-Capillary: 164 mg/dL — ABNORMAL HIGH (ref 70–99)
Glucose-Capillary: 175 mg/dL — ABNORMAL HIGH (ref 70–99)
Glucose-Capillary: 179 mg/dL — ABNORMAL HIGH (ref 70–99)

## 2022-07-09 LAB — HEMOGLOBIN A1C
Hgb A1c MFr Bld: 8.1 % — ABNORMAL HIGH (ref 4.8–5.6)
Mean Plasma Glucose: 186 mg/dL

## 2022-07-09 LAB — MAGNESIUM: Magnesium: 2 mg/dL (ref 1.7–2.4)

## 2022-07-09 MED ORDER — APIXABAN 5 MG PO TABS
5.0000 mg | ORAL_TABLET | Freq: Two times a day (BID) | ORAL | Status: DC
  Administered 2022-07-09 – 2022-07-12 (×6): 5 mg via ORAL
  Filled 2022-07-09 (×6): qty 1

## 2022-07-09 MED ORDER — BISACODYL 10 MG RE SUPP
10.0000 mg | Freq: Every day | RECTAL | Status: DC
  Administered 2022-07-09 – 2022-07-11 (×3): 10 mg via RECTAL
  Filled 2022-07-09 (×3): qty 1

## 2022-07-09 MED ORDER — SODIUM CHLORIDE 0.9 % IV SOLN
3.0000 g | Freq: Four times a day (QID) | INTRAVENOUS | Status: DC
  Administered 2022-07-09 – 2022-07-10 (×4): 3 g via INTRAVENOUS
  Filled 2022-07-09 (×4): qty 8

## 2022-07-09 NOTE — Progress Notes (Signed)
3 Days Post-Op  Subjective: Pt concerned about bowel function. Normally does digital stim with suppositories at home nightly. Wife at bedside and instructed on wound care as well.   Objective: Vital signs in last 24 hours: Temp:  [97.6 F (36.4 C)-97.9 F (36.6 C)] 97.9 F (36.6 C) (05/28 0727) Pulse Rate:  [68-84] 68 (05/28 0727) Resp:  [12-17] 17 (05/28 0727) BP: (97-108)/(62-86) 107/72 (05/28 0727) SpO2:  [96 %-98 %] 98 % (05/28 0727) Last BM Date : 07/04/22  Intake/Output from previous day: 05/27 0701 - 05/28 0700 In: 340 [P.O.:240; IV Piggyback:100] Out: 2600 [Urine:2600] Intake/Output this shift: No intake/output data recorded.  PE: Skin: wound is clean with no purulent drainage or necrotic tissue.  Black tissue is present from cautery.  Sacrum is palpable at base of the wound but still has periosteum covering this. Wound measures 10 cm x 7 cm x 5 cm with 1 cm undermining inferiorly toward rectum   Lab Results:  Recent Labs    07/08/22 0657 07/09/22 0317  WBC 7.0 7.8  HGB 9.5* 10.1*  HCT 29.1* 30.6*  PLT 321 355    BMET Recent Labs    07/08/22 0657 07/09/22 0317  NA 136 135  K 3.4* 3.7  CL 103 102  CO2 24 24  GLUCOSE 128* 139*  BUN 11 8  CREATININE 0.66 0.64  CALCIUM 8.1* 8.5*    PT/INR No results for input(s): "LABPROT", "INR" in the last 72 hours.  CMP     Component Value Date/Time   NA 135 07/09/2022 0317   NA 140 11/26/2018 1123   K 3.7 07/09/2022 0317   CL 102 07/09/2022 0317   CO2 24 07/09/2022 0317   GLUCOSE 139 (H) 07/09/2022 0317   BUN 8 07/09/2022 0317   BUN 10 11/26/2018 1123   CREATININE 0.64 07/09/2022 0317   CALCIUM 8.5 (L) 07/09/2022 0317   PROT 7.2 07/06/2022 0346   PROT 6.8 11/26/2018 1123   ALBUMIN 2.4 (L) 07/08/2022 0657   ALBUMIN 4.0 11/26/2018 1123   AST 43 (H) 07/06/2022 0346   ALT 47 (H) 07/06/2022 0346   ALKPHOS 60 07/06/2022 0346   BILITOT 0.7 07/06/2022 0346   BILITOT 0.2 11/26/2018 1123   GFRNONAA >60  07/09/2022 0317   GFRAA >60 09/25/2019 0434   Lipase     Component Value Date/Time   LIPASE 37 06/15/2022 1856       Studies/Results: No results found.  Anti-infectives: Anti-infectives (From admission, onward)    Start     Dose/Rate Route Frequency Ordered Stop   07/09/22 1400  Ampicillin-Sulbactam (UNASYN) 3 g in sodium chloride 0.9 % 100 mL IVPB        3 g 200 mL/hr over 30 Minutes Intravenous Every 6 hours 07/09/22 0921     07/07/22 1215  DAPTOmycin (CUBICIN) 750 mg in sodium chloride 0.9 % IVPB        8 mg/kg  91.8 kg (Adjusted) 130 mL/hr over 30 Minutes Intravenous Daily 07/07/22 1126     07/06/22 1000  piperacillin-tazobactam (ZOSYN) IVPB 3.375 g  Status:  Discontinued        3.375 g 12.5 mL/hr over 240 Minutes Intravenous Every 8 hours 07/06/22 0913 07/09/22 0921   07/06/22 0400  vancomycin (VANCOREADY) IVPB 1250 mg/250 mL  Status:  Discontinued        1,250 mg 166.7 mL/hr over 90 Minutes Intravenous 2 times daily 07/06/22 0305 07/07/22 1112   07/06/22 0100  vancomycin (VANCOCIN) IVPB 1000  mg/200 mL premix        1,000 mg 200 mL/hr over 60 Minutes Intravenous  Once 07/06/22 0050 07/06/22 0402   07/06/22 0100  piperacillin-tazobactam (ZOSYN) IVPB 3.375 g        3.375 g 100 mL/hr over 30 Minutes Intravenous  Once 07/06/22 0050 07/06/22 0212   07/05/22 2300  cefTRIAXone (ROCEPHIN) 1 g in sodium chloride 0.9 % 100 mL IVPB        1 g 200 mL/hr over 30 Minutes Intravenous  Once 07/05/22 2248 07/06/22 0139        Assessment/Plan POD3, s/p excisional debridement of sacral wound, Dr. Janee Morn 5/25 - NS WTD dressing changes BID - cultures with gram + cocci and gram - rods - abx per ID - wound does not need further debridement at this time, recommend pressure offloading as able, will place [facility] RN orders and recommend referral to wound care center on discharge - no other recommendations from a surgical standpoint at this time, we will sign off  FEN - carb mod VTE -  LMWH ID - unasyn/daptomycin   I reviewed Consultant ID notes, hospitalist notes, last 24 h vitals and pain scores, last 48 h intake and output, last 24 h labs and trends, and last 24 h imaging results.   LOS: 3 days    Juliet Rude , [facility] Surgery 07/09/2022, 10:37 AM Please see Amion for pager number during day hours 7:00am-4:30pm or 7:00am -11:30am on weekends

## 2022-07-09 NOTE — Progress Notes (Signed)
Brief ID note:  Patient postop day #3 for excisional debridement of large sacral wound.  Patient currently on Zosyn and daptomycin.  Operative cultures growing strep constellatus, Staph epidermidis, and Bacteroides.  Will narrow Zosyn to Unasyn and continue daptomycin pending the staph epi susceptibilities.  Continue ongoing wound care, nutrition, offloading, diversion.   Vedia Coffer for Infectious Disease [facility] 07/09/2022, 2:17 PM

## 2022-07-09 NOTE — Discharge Instructions (Signed)
WOUND CARE: - dressing to be changed twice daily. Change after rectal stimulation and BM in the evening  - supplies: sterile saline, kerlix/guaze, scissors, ABD pads, tape  - remove dressing and all packing carefully, moistening with sterile saline as needed to avoid packing/internal dressing sticking to the wound. - clean edges of skin around the wound with water/gauze, making sure there is no tape debris or leakage left on skin that could cause skin irritation or breakdown. - dampen clean kerlix/gauze with sterile saline and pack wound from wound base to skin level, making sure to take note of any possible areas of wound tracking, tunneling and packing appropriately. Wound can be packed loosely. Trim kerlix/gauze to size if a whole roll/piece is not required. - cover wound with a dry ABD pad and secure with tape.  - write the date/time on the dry dressing/tape to better track when the last dressing change occurred. - apply any skin protectant/powder recommended by clinician to protect skin/skin folds. - change dressing as needed if leakage occurs, wound gets contaminated, or patient requests to shower. - patient may shower daily with wound open and following the shower the wound should be dried and a clean dressing placed.

## 2022-07-09 NOTE — TOC Initial Note (Signed)
Transition of Care [facility]) - Initial/Assessment Note    Patient Details  Name: [name] MRN: [id number] Date of Birth: Feb 03, 1966  [facility] CM/SW Contact:    Harriet Masson, RN Phone Number: 07/09/2022, 3:50 PM  Clinical Narrative:                  Spoke to patient at bedside using stratus interpreter.  Patient lives with wife and children. Patient request RNCM to speak to wife tomorrow at 1000 due to the fact the wife knows all his medically information.  TOC following.   Expected Discharge Plan: Home w Home Health Services Barriers to Discharge: Continued Medical Work up   Patient Goals and CMS Choice            Expected Discharge Plan and Services       Living arrangements for the past 2 months: Single Family Home                                      Prior Living Arrangements/Services Living arrangements for the past 2 months: Single Family Home Lives with:: Spouse Patient language and need for interpreter reviewed:: Yes Do you feel safe going back to the place where you live?: Yes      Need for Family Participation in Patient Care: Yes (Comment) Care giver support system in place?: Yes (comment)   Criminal Activity/Legal Involvement Pertinent to Current Situation/Hospitalization: No - Comment as needed  Activities of Daily Living      Permission Sought/Granted                  Emotional Assessment Appearance:: Appears stated age Attitude/Demeanor/Rapport: Engaged Affect (typically observed): Accepting Orientation: : Oriented to Self, Oriented to Place, Oriented to  Time, Oriented to Situation Alcohol / Substance Use: Not Applicable Psych Involvement: No (comment)  Admission diagnosis:  Cellulitis of buttock [L03.317] Cellulitis, gluteal, left [L03.317] Sepsis due to cellulitis (HCC) [L03.90, A41.9] Patient Active Problem List   Diagnosis Date Noted   Paraplegia (HCC) 07/07/2022   Cellulitis, gluteal, left  07/06/2022   Hypertension 02/28/2022   Pressure injury of skin 08/07/2021   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)    Acute cystitis with hematuria    Acute osteomyelitis (HCC) 08/06/2021   Chronic pain due to trauma 01/01/2021   DM2 (diabetes mellitus, type 2) (HCC) 04/26/2020   History of DVT (deep vein thrombosis) 04/26/2020   UTI due to extended-spectrum beta lactamase (ESBL) producing Escherichia coli 04/26/2020   At risk for unstable body temperature 11/26/2019   Wheelchair dependence 11/26/2019   Sepsis due to cellulitis (HCC) 09/22/2019   Hypokalemia 09/22/2019   Spasticity 03/08/2019   Erectile dysfunction due to diseases classified elsewhere 03/08/2019   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/22/2019   Neurogenic bowel 01/22/2019   Neurogenic bladder 01/22/2019   Myofascial pain 01/22/2019   Spinal cord injury at T1-T6 level [facility]) 12/30/2018   Quadriplegia (HCC) 12/30/2018   PCP:  System, Provider Not In Pharmacy:   [facility] - [place], [place] - [address] [place] [address] Phone: [phone] Fax: [phone]     Social Determinants of Health (SDOH) Social History: SDOH Screenings   Depression (PHQ2-9): Low Risk  (03/01/2022)  Tobacco Use: Low Risk  (07/07/2022)   SDOH Interventions:     Readmission Risk Interventions     No data  to display

## 2022-07-09 NOTE — Progress Notes (Signed)
PROGRESS NOTE    [name]  ZOX:[id number] DOB: Aug 04, 1965 DOA: 07/05/2022 PCP: System, Provider Not In    Chief Complaint  Patient presents with   Fever    Brief Narrative: Is a 57 year old gentleman history of diabetes, paraplegia, PE/DVT status post IVC filter, neurogenic bladder with chronic Foley wheelchair-bound presented to the ED with fevers.  Workup concerning for a infected decubitus ulcer.  CT abdomen and pelvis performed with left medial gluteal cleft tissue emphysema with overlying dermal thickening and fat stranding concerning for infection.  Known obstructive left-sided nephrolithiasis with colonic diverticulosis.  General surgery consulted and patient underwent irrigation and debridement of sacral wound with necrotic purulent tissue removed and cultures sent.  Patient placed empirically on IV vancomycin and Zosyn.  ID also consulted and antibiotics adjusted.   Assessment & Plan:   Principal Problem:   Cellulitis, gluteal, left Active Problems:   Quadriplegia (HCC)   Presence of IVC filter   Sepsis due to cellulitis (HCC)   DM2 (diabetes mellitus, type 2) (HCC)   History of DVT (deep vein thrombosis)   Acute cystitis with hematuria   Hypertension   Paraplegia (HCC)  #1 infected sacral decubitus ulcer -Status post excisional debridement of sacral wound per Dr. Violeta Gelinas 07/06/2022 with necrotic purulent tissue removed, cultures pending. -Preliminary cultures predominantly mononuclear with few gram-positive cocci in pairs, few gram-negative rods. -Patient was on IV vancomycin, IV Zosyn on admission. -ID consulted patient seen in consultation by Dr. Daiva Eves and IV vancomycin discontinued and changed to IV daptomycin and recommending continuation of Zosyn pending surgical cultures. -Per ID patient with no exposed bone however recommending a protracted oral antibiotics if organisms can be pinned down and are sensitive to oral antibiotics. -General surgery  reassessed patient, feel wound is clean, no active infection or significant left necrotic tissue at this time and recommended local wound care.  No plans for any further procedure per general surgery. -General surgery has placed home health RN orders and recommended referral to wound care center on discharge.  General surgery signed off as of 07/09/2022. -ID following and IV Zosyn has been narrowed to IV Unasyn.  Continue IV daptomycin.  2.?  UTI versus bacteriuria/colonization -Patient with chronic indwelling Foley catheter with paraplegia. -Urinalysis done with large leukocytes, nitrite negative, many bacteria, WBC > 50.  -Urine cultures with > 100,000 colonies of ESBL E. coli and Proteus Mirabilis. -Patient seen in consultation by ID who feel could likely be a colonization in the patient with chronic indwelling Foley catheter and feel patient source of infection more from the sacral decubitus ulcer. -Patient empirically on IV antibiotics. -ID following.  3.  History of DVT -Patient being followed by general surgery and no plans for any further procedures and as such we will discontinue full dose Lovenox and resume home regimen Eliquis.    4.  Sepsis secondary to sacral decubitus ulcer -Patient on admission met criteria for sepsis due to tachycardia tachypnea and source of infection. -Status post I&D with preliminary surgical cultures few Streptococcus constellatus, Staphylococcus epidermis, sensitivities pending. -On IV daptomycin, IV Zosyn, patient being followed by ID and IV Zosyn has been narrowed to IV Unasyn.   -On IV daptomycin.   -Appreciate ID input and recommendations.    5.  History of IVC filter -Patient high risk for DVT and PE. -Was on Eliquis which was held in anticipation of procedure. -See #3.  6.  Diabetes mellitus type 2 -Hemoglobin A1c 7.3 (08/06/2021) -Repeat hemoglobin A1c 8.1 (07/06/2022). -  CBG 130 this morning. -Continue to hold home oral hypoglycemic agents as  well as long-acting insulin. -SSI. -CBG 120 this morning.  7.  Hypertension -BP soft. -Lisinopril on hold.   -Status post IV albumin x 1.      DVT prophylaxis: Full dose Lovenox>>>> Eliquis Code Status: Full Family Communication: Updated patient.  No family at bedside.  Disposition: To home when clinically improved, finalization of culture results and when cleared by ID with antibiotic recommendations and duration.   Status is: Inpatient Remains inpatient appropriate because: Severity of illness   Consultants:  General surgery: Dr. Sheliah Hatch 07/06/2022 ID: Dr. Daiva Eves 07/07/2022  Procedures:  CT abdomen and pelvis 07/05/2022 Chest x-ray 07/05/2022 Irrigation and debridement sacral wound 11 x 6 x 4.5 cm per Dr. Violeta Gelinas: General surgeon 07/06/2022  Antimicrobials:  Anti-infectives (From admission, onward)    Start     Dose/Rate Route Frequency Ordered Stop   07/09/22 1400  Ampicillin-Sulbactam (UNASYN) 3 g in sodium chloride 0.9 % 100 mL IVPB        3 g 200 mL/hr over 30 Minutes Intravenous Every 6 hours 07/09/22 0921     07/07/22 1215  DAPTOmycin (CUBICIN) 750 mg in sodium chloride 0.9 % IVPB        8 mg/kg  91.8 kg (Adjusted) 130 mL/hr over 30 Minutes Intravenous Daily 07/07/22 1126     07/06/22 1000  piperacillin-tazobactam (ZOSYN) IVPB 3.375 g  Status:  Discontinued        3.375 g 12.5 mL/hr over 240 Minutes Intravenous Every 8 hours 07/06/22 0913 07/09/22 0921   07/06/22 0400  vancomycin (VANCOREADY) IVPB 1250 mg/250 mL  Status:  Discontinued        1,250 mg 166.7 mL/hr over 90 Minutes Intravenous 2 times daily 07/06/22 0305 07/07/22 1112   07/06/22 0100  vancomycin (VANCOCIN) IVPB 1000 mg/200 mL premix        1,000 mg 200 mL/hr over 60 Minutes Intravenous  Once 07/06/22 0050 07/06/22 0402   07/06/22 0100  piperacillin-tazobactam (ZOSYN) IVPB 3.375 g        3.375 g 100 mL/hr over 30 Minutes Intravenous  Once 07/06/22 0050 07/06/22 0212   07/05/22 2300   cefTRIAXone (ROCEPHIN) 1 g in sodium chloride 0.9 % 100 mL IVPB        1 g 200 mL/hr over 30 Minutes Intravenous  Once 07/05/22 2248 07/06/22 0139         Subjective: Sitting up in bed watching television.  No chest pain.  No shortness of breath.  No abdominal pain.   Objective: Vitals:   07/08/22 1331 07/08/22 2203 07/09/22 0434 07/09/22 0727  BP: 99/62 97/86 108/72 107/72  Pulse: 72 84 69 68  Resp: 14 14 12 17   Temp: 97.8 F (36.6 C) 97.9 F (36.6 C) 97.6 F (36.4 C) 97.9 F (36.6 C)  TempSrc: Oral Oral Oral Oral  SpO2:  96% 98% 98%  Weight:      Height:        Intake/Output Summary (Last 24 hours) at 07/09/2022 1147 Last data filed at 07/08/2022 1300 Gross per 24 hour  Intake --  Output 2600 ml  Net -2600 ml    Filed Weights   07/05/22 2047 07/06/22 1143  Weight: 127 kg 127 kg    Examination:  General exam: NAD. Respiratory system: Lungs clear to auscultation bilaterally.  No wheezes, no crackles, no rhonchi.  Fair air movement.  Speaking in full sentences. Cardiovascular system: RRR no murmurs rubs  or gallops.  No JVD.  No lower extremity edema.  Gastrointestinal system: Abdomen is soft, nontender, nondistended, positive bowel sounds.  No rebound.  No guarding.  Central nervous system: Alert and oriented.  Paraplegia.  No focal neurological deficits. Extremities: Symmetric 5 x 5 power. Skin: No rashes, lesions or ulcers Psychiatry: Judgement and insight appear normal. Mood & affect appropriate.        Data Reviewed: I have personally reviewed following labs and imaging studies  CBC: Recent Labs  Lab 07/05/22 2100 07/06/22 0346 07/07/22 0216 07/08/22 0657 07/09/22 0317  WBC 8.9 8.6 9.5 7.0 7.8  NEUTROABS 6.6  --  8.0* 3.7 4.1  HGB 10.3* 10.0* 10.1* 9.5* 10.1*  HCT 31.8* 31.0* 30.2* 29.1* 30.6*  MCV 87.4 88.8 86.8 88.4 86.2  PLT 304 285 324 321 355     Basic Metabolic Panel: Recent Labs  Lab 07/05/22 2100 07/06/22 0346 07/07/22 0216  07/08/22 0657 07/09/22 0317  NA 134* 136 133* 136 135  K 3.9 3.6 3.6 3.4* 3.7  CL 101 105 101 103 102  CO2 23 22 22 24 24   GLUCOSE 145* 149* 197* 128* 139*  BUN 11 9 10 11 8   CREATININE 0.83 0.67 0.71 0.66 0.64  CALCIUM 8.5* 8.3* 8.6* 8.1* 8.5*  MG  --   --  1.8 1.8 2.0  PHOS  --   --   --  3.4  --      GFR: Estimated Creatinine Clearance: 137.1 mL/min (by C-G formula based on SCr of 0.64 mg/dL).  Liver Function Tests: Recent Labs  Lab 07/05/22 2100 07/06/22 0346 07/08/22 0657  AST 41 43*  --   ALT 47* 47*  --   ALKPHOS 65 60  --   BILITOT 0.8 0.7  --   PROT 7.6 7.2  --   ALBUMIN 2.7* 2.6* 2.4*     CBG: Recent Labs  Lab 07/08/22 1633 07/08/22 2041 07/09/22 0013 07/09/22 0430 07/09/22 0726  GLUCAP 138* 204* 164* 127* 130*      Recent Results (from the past 240 hour(s))  Urine Culture     Status: Abnormal   Collection Time: 07/05/22 10:05 PM   Specimen: Urine, Catheterized  Result Value Ref Range Status   Specimen Description URINE, CATHETERIZED  Final   Special Requests   Final    NONE Performed at [facility], [address] [place], [place] [address]    Culture (A)  Final    >=100,000 COLONIES/mL ESCHERICHIA COLI >=100,000 COLONIES/mL PROTEUS MIRABILIS Confirmed Extended Spectrum Beta-Lactamase Producer (ESBL).  In bloodstream infections from ESBL organisms, carbapenems are preferred over piperacillin/tazobactam. They are shown to have a lower risk of mortality.    Report Status 07/08/2022 FINAL  Final   Organism ID, Bacteria ESCHERICHIA COLI (A)  Final   Organism ID, Bacteria PROTEUS MIRABILIS (A)  Final      Susceptibility   Escherichia coli - MIC*    AMPICILLIN >=32 RESISTANT Resistant     CEFAZOLIN >=64 RESISTANT Resistant     CEFEPIME 16 RESISTANT Resistant     CEFTRIAXONE >=64 RESISTANT Resistant     CIPROFLOXACIN >=4 RESISTANT Resistant     GENTAMICIN <=1 SENSITIVE Sensitive     IMIPENEM <=0.25 SENSITIVE Sensitive      NITROFURANTOIN 128 RESISTANT Resistant     TRIMETH/SULFA <=20 SENSITIVE Sensitive     AMPICILLIN/SULBACTAM 8 SENSITIVE Sensitive     PIP/TAZO <=4 SENSITIVE Sensitive     * >=100,000 COLONIES/mL ESCHERICHIA COLI  Proteus mirabilis - MIC*    AMPICILLIN <=2 SENSITIVE Sensitive     CEFAZOLIN <=4 SENSITIVE Sensitive     CEFEPIME <=0.12 SENSITIVE Sensitive     CEFTRIAXONE <=0.25 SENSITIVE Sensitive     CIPROFLOXACIN <=0.25 SENSITIVE Sensitive     GENTAMICIN <=1 SENSITIVE Sensitive     IMIPENEM 4 SENSITIVE Sensitive     NITROFURANTOIN RESISTANT Resistant     TRIMETH/SULFA >=320 RESISTANT Resistant     AMPICILLIN/SULBACTAM <=2 SENSITIVE Sensitive     PIP/TAZO <=4 SENSITIVE Sensitive     * >=100,000 COLONIES/mL PROTEUS MIRABILIS  Aerobic/Anaerobic Culture w Gram Stain (surgical/deep wound)     Status: None (Preliminary result)   Collection Time: 07/06/22 12:53 PM   Specimen: Path fluid; Body Fluid  Result Value Ref Range Status   Specimen Description WOUND  Final   Special Requests SACRAL  Final   Gram Stain   Final    RARE WBC PRESENT, PREDOMINANTLY MONONUCLEAR FEW GRAM POSITIVE COCCI IN PAIRS FEW GRAM NEGATIVE RODS    Culture   Final    FEW STREPTOCOCCUS CONSTELLATUS Beta hemolytic streptococci are predictably susceptible to penicillin and other beta lactams. Susceptibility testing not routinely performed. RARE STAPHYLOCOCCUS EPIDERMIDIS HOLDING FOR POSSIBLE ANAEROBE Performed at [facility], [address] [place], [place] [address]    Report Status PENDING  Incomplete         Radiology Studies: No results found.      Scheduled Meds:  apixaban  5 mg Oral BID   bisacodyl  10 mg Rectal Daily   Chlorhexidine Gluconate Cloth  6 each Topical Daily   docusate sodium  100 mg Oral BID   FLUoxetine  20 mg Oral Daily   gabapentin  600 mg Oral TID   insulin aspart  0-9 Units Subcutaneous TID WC   Continuous Infusions:  ampicillin-sulbactam (UNASYN) IV      DAPTOmycin (CUBICIN) 750 mg in sodium chloride 0.9 % IVPB 750 mg (07/08/22 2041)     LOS: 3 days    Time spent: 35 minutes    Ramiro Harvest, MD [facility]   To contact the attending provider between 7A-7P or the covering provider during after hours 7P-7A, please log into the web site [email or web address] and access using universal [place] password for that web site. If you do not have the password, please call the hospital operator.  07/09/2022, 11:47 AM

## 2022-07-09 NOTE — Plan of Care (Signed)
  Problem: Education: Goal: Knowledge of General Education information will improve Description: Including pain rating scale, medication(s)/side effects and non-pharmacologic comfort measures Outcome: Progressing   Problem: Clinical Measurements: Goal: Ability to maintain clinical measurements within normal limits will improve Outcome: Progressing Goal: Will remain free from infection Outcome: Progressing Goal: Diagnostic test results will improve Outcome: Progressing   Problem: Activity: Goal: Risk for activity intolerance will decrease Outcome: Progressing   Problem: Safety: Goal: Ability to remain free from injury will improve Outcome: Progressing   Problem: Skin Integrity: Goal: Risk for impaired skin integrity will decrease Outcome: Progressing   

## 2022-07-09 NOTE — Progress Notes (Signed)
I assisted [name] ordering he's lunch, by Orlan Leav[employer].

## 2022-07-09 NOTE — Care Management Important Message (Signed)
Important Message  Patient Details  Name: Paulmichael Mangiapane MRN: [id number] Date of Birth: 1965/12/08   Medicare Important Message Given:  Yes     Sherilyn Banker 07/09/2022, 4:26 PM

## 2022-07-10 ENCOUNTER — Encounter: Payer: 59 | Admitting: Physical Medicine and Rehabilitation

## 2022-07-10 DIAGNOSIS — Z86718 Personal history of other venous thrombosis and embolism: Secondary | ICD-10-CM | POA: Diagnosis not present

## 2022-07-10 DIAGNOSIS — L03317 Cellulitis of buttock: Secondary | ICD-10-CM | POA: Diagnosis not present

## 2022-07-10 DIAGNOSIS — E119 Type 2 diabetes mellitus without complications: Secondary | ICD-10-CM | POA: Diagnosis not present

## 2022-07-10 DIAGNOSIS — L89154 Pressure ulcer of sacral region, stage 4: Secondary | ICD-10-CM

## 2022-07-10 DIAGNOSIS — N3001 Acute cystitis with hematuria: Secondary | ICD-10-CM | POA: Diagnosis not present

## 2022-07-10 LAB — BASIC METABOLIC PANEL
Anion gap: 10 (ref 5–15)
BUN: 8 mg/dL (ref 6–20)
CO2: 22 mmol/L (ref 22–32)
Calcium: 8.7 mg/dL — ABNORMAL LOW (ref 8.9–10.3)
Chloride: 101 mmol/L (ref 98–111)
Creatinine, Ser: 0.6 mg/dL — ABNORMAL LOW (ref 0.61–1.24)
GFR, Estimated: >60 mL/min (ref >60)
Glucose, Bld: 148 mg/dL — ABNORMAL HIGH (ref 70–99)
Potassium: 3.6 mmol/L (ref 3.5–5.1)
Sodium: 133 mmol/L — ABNORMAL LOW (ref 135–145)

## 2022-07-10 LAB — CBC WITH DIFFERENTIAL/PLATELET
Abs Immature Granulocytes: 0.3 10*3/uL — ABNORMAL HIGH (ref 0.00–0.07)
Basophils Absolute: 0.1 10*3/uL (ref 0.0–0.1)
Basophils Relative: 1 %
Eosinophils Absolute: 0.3 10*3/uL (ref 0.0–0.5)
Eosinophils Relative: 4 %
HCT: 31.8 % — ABNORMAL LOW (ref 39.0–52.0)
Hemoglobin: 10.5 g/dL — ABNORMAL LOW (ref 13.0–17.0)
Immature Granulocytes: 4 %
Lymphocytes Relative: 34 %
Lymphs Abs: 2.9 10*3/uL (ref 0.7–4.0)
MCH: 29 pg (ref 26.0–34.0)
MCHC: 33 g/dL (ref 30.0–36.0)
MCV: 87.8 fL (ref 80.0–100.0)
Monocytes Absolute: 0.5 10*3/uL (ref 0.1–1.0)
Monocytes Relative: 6 %
Neutro Abs: 4.4 10*3/uL (ref 1.7–7.7)
Neutrophils Relative %: 51 %
Platelets: 402 10*3/uL — ABNORMAL HIGH (ref 150–400)
RBC: 3.62 MIL/uL — ABNORMAL LOW (ref 4.22–5.81)
RDW: 13.7 % (ref 11.5–15.5)
WBC: 8.5 10*3/uL (ref 4.0–10.5)
nRBC: 0.2 % (ref 0.0–0.2)

## 2022-07-10 LAB — AEROBIC/ANAEROBIC CULTURE W GRAM STAIN (SURGICAL/DEEP WOUND)

## 2022-07-10 LAB — GLUCOSE, CAPILLARY
Glucose-Capillary: 154 mg/dL — ABNORMAL HIGH (ref 70–99)
Glucose-Capillary: 159 mg/dL — ABNORMAL HIGH (ref 70–99)
Glucose-Capillary: 154 mg/dL — ABNORMAL HIGH (ref 70–99)
Glucose-Capillary: 136 mg/dL — ABNORMAL HIGH (ref 70–99)
Glucose-Capillary: 168 mg/dL — ABNORMAL HIGH (ref 70–99)

## 2022-07-10 LAB — HEPATITIS B SURFACE ANTIBODY, QUANTITATIVE: Hep B S AB Quant (Post): <3.5 m[IU]/mL — ABNORMAL LOW (ref >9.9)

## 2022-07-10 LAB — MAGNESIUM: Magnesium: 1.9 mg/dL (ref 1.7–2.4)

## 2022-07-10 LAB — SURGICAL PATHOLOGY

## 2022-07-10 MED ORDER — DOXYCYCLINE HYCLATE 100 MG PO TABS
100.0000 mg | ORAL_TABLET | Freq: Two times a day (BID) | ORAL | Status: DC
  Administered 2022-07-10 – 2022-07-12 (×5): 100 mg via ORAL
  Filled 2022-07-10 (×5): qty 1

## 2022-07-10 MED ORDER — AMOXICILLIN-POT CLAVULANATE 875-125 MG PO TABS
1.0000 | ORAL_TABLET | Freq: Two times a day (BID) | ORAL | Status: DC
  Administered 2022-07-10 – 2022-07-12 (×5): 1 via ORAL
  Filled 2022-07-10 (×5): qty 1

## 2022-07-10 NOTE — Hospital Course (Addendum)
The patient is a 57 year old Hispanic male with a past medical history significant for benign to diabetes mellitus type 2, paraplegia, history of PE and DVT status post IVC filter, history of neurogenic bladder with chronic Foley catheter who is wheelchair-bound with other associated comorbidities who presented to the ED with fevers.  Workup was concerning for infected decubitus ulcer.  CT scan of the abdomen pelvis was done and showed a left medial gluteal cleft tissue emphysema with overlying dermal thickening and fat stranding concerning for infection.  He has a known obstructive left-sided nephrolithiasis with colonic diverticulosis.  General surgery was consulted as well as infectious diseases and patient underwent irrigation and debridement of the sacral wound with necrotic purulent tissue removed and cultures being sent.  He was placed on empiric antibiotics with IV vancomycin and Zosyn and ID was consulted for further adjustments for his antibiotics.  His antibiotics have now been de-escalated to oral antibiotics and surgery is sign of the case.  TOC assisting with discharge disposition and will need to figure out if the amount of wound care that he requires can be done at home or if he will need to go to SNF.  He is now being set up for home health PT OT and RN and nursing educating wife and dressing changes as well as ordering a home health nurse.  Ordering a low loss air mattress for the patient and home health to evaluate see the patient Monday.  He is medically stable for discharge now that his equipment is delivered and has no acute issues.  Will need to follow-up with PCP, infectious diseases and general surgery in outpatient setting  Assessment and Plan:  Infected Sacral Decubitus Ulcer -Status post excisional debridement of sacral wound per Dr. Violeta Gelinas 07/06/2022 with necrotic purulent tissue removed. -Preliminary cultures predominantly mononuclear with few gram-positive cocci in pairs,  few gram-negative rods and now Growth as below -Patient was on IV vancomycin, IV Zosyn on admission. -ID consulted patient seen in consultation by Dr. Daiva Eves and IV vancomycin discontinued and changed to IV daptomycin and recommending continuation of Zosyn which was de-escalated to IV Unasyn yesterday  and now changed to oral Abx with Doxycycline and Augment -Per ID patient with no exposed bone however recommending a protracted oral antibiotics if organisms can be pinned down and are sensitive to oral antibiotics. -General surgery reassessed patient, feel wound is clean, no active infection or significant left necrotic tissue at this time and recommended local wound care.  No plans for any further procedure per general surgery. -General surgery has placed home health RN orders and recommended referral to wound care center on discharge.  General surgery signed off as of 07/09/2022. -ID now signed off the case -Will see if he can get the extensive Wound care Required at home or if will need to go to SNF; [facility] assisting with Discharge Disposition to see if extensive Wound care can be done or if he will need SNF.  They are now setting him up for home health with PT OT and RN and ordering him a low loss air mattress as well as attempting to get him a new Hoyer lift.  Anticipate discharging patient is on 07/12/2022 once equipment is delivered -Will obtain WOC nurse evaluation however there are orders placed by surgery in place and the WOC does not feel there is a role for her at this time -Continue with pain control with acetaminophen 650 mg p.o. see every 6h as needed mild pain  or fever, oxycodone 5 mg p.o. every 8 as needed for moderate and severe pain, IV morphine 2 mg every 2 as needed for severe and moderate pain and supportive care with ondansetron 4 mg p.o./IV every 6 as needed -Continue with bowel regimen with bisacodyl 10 mg rectally daily as well as docusate 100 mg p.o. every 12h -Patient is stable for  discharge at this time   UTI versus bacteriuria/colonization -Patient with chronic indwelling Foley catheter with paraplegia. -Urinalysis done with large leukocytes, nitrite negative, many bacteria, WBC > 50.  -Urine cultures with > 100,000 colonies of ESBL E. coli and Proteus Mirabilis. -Patient seen in consultation by ID who feel could likely be a colonization in the patient with chronic indwelling Foley catheter and feel patient source of infection more from the sacral decubitus ulcer. -Patient empirically on IV antibiotics below and now being transitioned to oral Abx -ID following and change antibiotics as below   History of DVT -Patient being followed by general surgery and no plans for any further procedures so Full Dose Lovenox was discontinued and resumed home regimen Eliquis.     Sepsis secondary to sacral decubitus ulcer s/p I and D POD #5 -Patient on admission met criteria for sepsis due to tachycardia tachypnea and source of infection. -Status post I&D with preliminary surgical cultures few Streptococcus constellatus, Staphylococcus epidermis, and Bacteroides -WBC Trend: Recent Labs  Lab 07/06/22 0346 07/07/22 0216 07/08/22 0657 07/09/22 0317 07/10/22 0449 07/11/22 0212 07/12/22 0323  WBC 8.6 9.5 7.0 7.8 8.5 9.0 10.7*  -Was on on IV daptomycin, IV Zosyn, patient being followed by ID and IV Zosyn has been narrowed to IV Unasyn today today now ID is recommending 4 weeks of oral antibiotics of Augmentin and Doxycycline -Appreciate ID input and recommendations.   -Neurosurgery evaluated and feel that the wound is clean with no active infection and no significant necrotic tissue and they recommended continuing local wound care and have signed off the case -ID recommends optimizing site offloading and nutrition -ID recommends following up in ID clinic on 08/02/2022 at 10:30 AM   History of IVC filter -Patient high risk for DVT and PE. -Was on Eliquis which was held in  anticipation of procedure but has now been resumed on the evening of 07/09/22  Hyponatremia -Mild. Na+ Trend: Recent Labs  Lab 07/06/22 0346 07/07/22 0216 07/08/22 0657 07/09/22 0317 07/10/22 0449 07/11/22 0212 07/12/22 0323  NA 136 133* 136 135 133* 134* 132*  -Continue to Monitor and Trend and Repeat CMP in the AM    Diabetes Mellitus Type 2 -Previous Hemoglobin A1c 7.3 (08/06/2021) and Repeat hemoglobin A1c 8.1 (07/06/2022). -Continue to hold home oral hypoglycemic agents as well as long-acting insulin. -Patient has been initiated on sensitive NovoLog/scale insulin before meals  -Continue to Monitor CBG's per Protocol; CBG Trend: Recent Labs  Lab 07/11/22 1132 07/11/22 1606 07/11/22 2004 07/11/22 2355 07/12/22 0412 07/12/22 0811 07/12/22 1122  GLUCAP 163* 145* 165* 144* 156* 175* 214*    Hypertension -BP was on the softer side. -Lisinopril on hold.   -Has IV hydralazine 5 mg every 4 as needed for high blood pressure for systolic blood pressure greater than 180 if necessary -Status post IV albumin x 1. -Continue to Monitor BP per Protocol -Last BP reading was 107/57  Thrombocytosis -Likely Reactive -Platelet Count Trend: Recent Labs  Lab 07/06/22 0346 07/07/22 0216 07/08/22 0657 07/09/22 0317 07/10/22 0449 07/11/22 0212 07/12/22 0323  PLT 285 324 321 355 402* 391 394  -  Continue to Monitor and Trend and repeat CBC in the AM  Paraplegia [facility]) -From a trauma few years ago. -Patient is at high risk for sacral decub and osteomyelitis. -Patient will need aggressive skin care for prevention in the future with an DME and physical therapy and skin protection. -Education of family in Spanish when stable for discharge.  Normocytic Anemia -Hgb/Hct Trend: Recent Labs  Lab 07/06/22 0346 07/07/22 0216 07/08/22 0657 07/09/22 0317 07/10/22 0449 07/11/22 0212 07/12/22 0323  HGB 10.0* 10.1* 9.5* 10.1* 10.5* 10.7* 10.6*  HCT 31.0* 30.2* 29.1* 30.6* 31.8* 32.9*  32.7*  MCV 88.8 86.8 88.4 86.2 87.8 85.9 87.0  -Check Anemia Panel in the AM  -Continue to Monitor for S/Sx of Bleeding; No overt bleeding noted -Repeat CBC in the AM   Hypoalbuminemia -Patient's Albumin Trend: Recent Labs  Lab 06/15/22 1856 07/05/22 2100 07/06/22 0346 07/08/22 0657 07/11/22 0212 07/12/22 0323  ALBUMIN 3.1* 2.7* 2.6* 2.4* 3.0* 2.9*  -Continue to Monitor and Trend and repeat CMP in the AM    Morbid Obesity -Complicates overall prognosis and care -Estimated body mass index is 42.58 kg/m as calculated from the following:   Height as of this encounter: 5' 7.99" (1.727 m).   Weight as of this encounter: 127 kg.  -Weight Loss and Dietary Counseling given

## 2022-07-10 NOTE — Progress Notes (Signed)
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  Workup was concerning for infected decubitus ulcer.  CT scan of the abdomen pelvis was done and showed a left medial gluteal cleft tissue emphysema with overlying dermal thickening and fat stranding concerning for infection.  He has a known obstructive left-sided nephrolithiasis with colonic diverticulosis.  General surgery was consulted as well as infectious diseases and patient underwent irrigation and debridement of the sacral wound with necrotic purulent tissue removed and cultures being sent.  He was placed on empiric antibiotics with IV vancomycin and Zosyn and ID was consulted for further adjustments for his antibiotics.  His antibiotics have now been de-escalated to oral antibiotics and surgery is sign of the case.  TOC assisting with discharge disposition and will need to figure out if the amount of wound care that he requires can be done at home or if he will need to go to SNF.   Assessment and Plan:  Infected Sacral Decubitus Ulcer -Status post excisional debridement of sacral wound per Dr. Violeta Gelinas 07/06/2022 with necrotic purulent tissue removed. -Preliminary cultures predominantly mononuclear with few gram-positive cocci in pairs, few gram-negative rods and now Growth as below -Patient was on IV vancomycin, IV Zosyn on admission. -ID consulted patient seen in consultation by Dr. Daiva Eves and IV vancomycin discontinued and changed to IV daptomycin and recommending continuation of Zosyn which was de-escalated to IV Unasyn  yesterday  and now changed to oral Abx with Doxycycline and Augment -Per ID patient with no exposed bone however recommending a protracted oral antibiotics if organisms can be pinned down and are sensitive to oral antibiotics. -General surgery reassessed patient, feel wound is clean, no active infection or significant left necrotic tissue at this time and recommended local wound care.  No plans for any further procedure per general surgery. -General surgery has placed home health RN orders and recommended referral to wound care center on discharge.  General surgery signed off as of 07/09/2022. -ID now signed off the case -Will see if he can get the extensive Wound care Required at home or if will need to go to SNF; [facility] assisting with Discharge Disposition to see if extensive Wound care can be done or if he will need SNF   UTI versus bacteriuria/colonization -Patient with chronic indwelling Foley catheter with paraplegia. -Urinalysis done with large leukocytes, nitrite negative, many bacteria, WBC > 50.  -Urine cultures with > 100,000 colonies of ESBL E. coli and Proteus Mirabilis. -Patient seen in consultation by ID who feel could likely be a colonization in the patient with chronic indwelling Foley catheter and feel patient source of infection more from the sacral decubitus ulcer. -Patient empirically on IV antibiotics below and now being transitioned to oral Abx -ID following and change antibiotics as below   History of DVT -Patient being followed by general surgery and no plans for any further procedures so Full Dose Lovenox was discontinued and resumed home regimen Eliquis.     Sepsis secondary to sacral decubitus ulcer s/p I and D POD #3 -Patient on admission met criteria for sepsis due to tachycardia  tachypnea and source of infection. -Status post I&D with preliminary surgical cultures few Streptococcus constellatus, Staphylococcus epidermis, and Bacteroides -Was on on IV daptomycin, IV  Zosyn, patient being followed by ID and IV Zosyn has been narrowed to IV Unasyn today today now ID is recommending 4 weeks of oral antibiotics of Augmentin and Doxycycline -Appreciate ID input and recommendations.   -Neurosurgery evaluated and feel that the wound is clean with no active infection and no significant necrotic tissue and they recommended continuing local wound care and have signed off the case -ID recommends optimizing site offloading and nutrition -ID recommends following up in ID clinic on 08/02/2022 at 10:30 AM   History of IVC filter -Patient high risk for DVT and PE. -Was on Eliquis which was held in anticipation of procedure but has now been resumed on the evening of 07/09/22  Hyponatremia -Mild. Na+ Trend: Recent Labs  Lab 06/15/22 1856 07/05/22 2100 07/06/22 0346 07/07/22 0216 07/08/22 0657 07/09/22 0317 07/10/22 0449  NA 132* 134* 136 133* 136 135 133*  -Continue to Monitor and Trend and Repeat CMP in the AM    Diabetes Mellitus Type 2 -Previous Hemoglobin A1c 7.3 (08/06/2021) and Repeat hemoglobin A1c 8.1 (07/06/2022). -Continue to hold home oral hypoglycemic agents as well as long-acting insulin. -Patient has been initiated on sensitive NovoLog/scale insulin before meals  -Continue to Monitor CBG's per Protocol; CBG Trend: Recent Labs  Lab 07/09/22 1153 07/09/22 1613 07/09/22 2024 07/09/22 2344 07/10/22 0749 07/10/22 0757 07/10/22 1123  GLUCAP 179* 125* 175* 162* 154* 159* 154*    Hypertension -BP was on the softer side. -Lisinopril on hold.   -Has IV hydralazine 5 mg every 4 as needed for high blood pressure for systolic blood pressure greater than 180 if necessary -Status post IV albumin x 1. -Continue to Monitor BP per Protocol -Last BP reading was 102/61  Thrombocytosis -Likely Reactive -Platelet Count Trend: Recent Labs  Lab 06/15/22 1856 07/05/22 2100 07/06/22 0346 07/07/22 0216 07/08/22 0657 07/09/22 0317 07/10/22 0449  PLT 330  304 285 324 321 355 402*  -Continue to Monitor and Trend and repeat CBC in the AM  Quadriplegia [facility]) -From a trauma few years ago. -Patient is at high risk for sacral decub and osteomyelitis. -Patient will need aggressive skin care for prevention in the future with an DME and physical therapy and skin protection. -Education of family in Spanish when stable for discharge.  Normocytic Anemia -Hgb/Hct Trend: Recent Labs  Lab 06/15/22 1856 07/05/22 2100 07/06/22 0346 07/07/22 0216 07/08/22 0657 07/09/22 0317 07/10/22 0449  HGB 12.0* 10.3* 10.0* 10.1* 9.5* 10.1* 10.5*  HCT 35.7* 31.8* 31.0* 30.2* 29.1* 30.6* 31.8*  MCV 86.4 87.4 88.8 86.8 88.4 86.2 87.8  -Check Anemia Panel in the AM  -Continue to Monitor for S/Sx of Bleeding; No overt bleeding noted -Repeat CBC in the AM    Morbid Obesity -Complicates overall prognosis and care -Estimated body mass index is 42.58 kg/m as calculated from the following:   Height as of this encounter: 5' 7.99" (1.727 m).   Weight as of this encounter: 127 kg.  -Weight Loss and Dietary Counseling given  DVT prophylaxis:  apixaban (ELIQUIS) tablet 5 mg    Code Status: Full Code Family Communication: Wife is at bedside  Disposition Plan:  Level of care: Med-Surg Status is: Inpatient Remains inpatient appropriate because: Needs further clinical improvement and need to see to ensure safe discharge disposition and if the amount of wound care can be done at home  will go home with home health or go to SNF  Consultants:  Infectious diseases General surgery  Procedures:  IRRIGATION AND DEBRIDEMENT SACRAL WOUND 11x6x4.5cm   Antimicrobials:  Anti-infectives (From admission, onward)    Start     Dose/Rate Route Frequency Ordered Stop   07/10/22 1100  doxycycline (VIBRA-TABS) tablet 100 mg        100 mg Oral Every 12 hours 07/10/22 1005 08/04/22 0959   07/10/22 1100  amoxicillin-clavulanate (AUGMENTIN) 875-125 MG per tablet 1 tablet        1  tablet Oral Every 12 hours 07/10/22 1005 08/04/22 0959   07/09/22 1400  Ampicillin-Sulbactam (UNASYN) 3 g in sodium chloride 0.9 % 100 mL IVPB  Status:  Discontinued        3 g 200 mL/hr over 30 Minutes Intravenous Every 6 hours 07/09/22 0921 07/10/22 1005   07/07/22 1215  DAPTOmycin (CUBICIN) 750 mg in sodium chloride 0.9 % IVPB  Status:  Discontinued        8 mg/kg  91.8 kg (Adjusted) 130 mL/hr over 30 Minutes Intravenous Daily 07/07/22 1126 07/10/22 1005   07/06/22 1000  piperacillin-tazobactam (ZOSYN) IVPB 3.375 g  Status:  Discontinued        3.375 g 12.5 mL/hr over 240 Minutes Intravenous Every 8 hours 07/06/22 0913 07/09/22 0921   07/06/22 0400  vancomycin (VANCOREADY) IVPB 1250 mg/250 mL  Status:  Discontinued        1,250 mg 166.7 mL/hr over 90 Minutes Intravenous 2 times daily 07/06/22 0305 07/07/22 1112   07/06/22 0100  vancomycin (VANCOCIN) IVPB 1000 mg/200 mL premix        1,000 mg 200 mL/hr over 60 Minutes Intravenous  Once 07/06/22 0050 07/06/22 0402   07/06/22 0100  piperacillin-tazobactam (ZOSYN) IVPB 3.375 g        3.375 g 100 mL/hr over 30 Minutes Intravenous  Once 07/06/22 0050 07/06/22 0212   07/05/22 2300  cefTRIAXone (ROCEPHIN) 1 g in sodium chloride 0.9 % 100 mL IVPB        1 g 200 mL/hr over 30 Minutes Intravenous  Once 07/05/22 2248 07/06/22 0139       Subjective: Seen and examined at bedside with the assistance of Spanish translator is [place] #[id number] and Zollie Scale #[id number] patient was doing okay and denied any pain.  States that he had an okay night and denied any nausea or vomiting.  No other concerns or complaints this time.  Objective: Vitals:   07/09/22 2025 07/10/22 0417 07/10/22 0750 07/10/22 1329  BP: 115/65 113/79 135/80 102/61  Pulse: 88 74 75 74  Resp: 13 18 15 19   Temp: 98 F (36.7 C) 97.9 F (36.6 C) 98 F (36.7 C) 98 F (36.7 C)  TempSrc: Oral  Oral Oral  SpO2: 95% 93% 96% 92%  Weight:      Height:        Intake/Output Summary (Last  24 hours) at 07/10/2022 1545 Last data filed at 07/10/2022 1300 Gross per 24 hour  Intake 580 ml  Output 3660 ml  Net -3080 ml   Filed Weights   07/05/22 2047 07/06/22 1143  Weight: 127 kg 127 kg   Examination: Physical Exam:  Constitutional: WN/WD obese chronically ill-appearing Hispanic male in no acute distress appears calm Respiratory: Diminished to auscultation bilaterally, no wheezing, rales, rhonchi or crackles. Normal respiratory effort and patient is not tachypenic. No accessory muscle use.  Unlabored breathing Cardiovascular: RRR, no murmurs / rubs / gallops. S1 and S2 auscultated.  No extremity edema. Abdomen: Soft, non-tender, distended secondary to body habitus. Bowel sounds positive.  GU: Deferred. Musculoskeletal: No clubbing / cyanosis of digits/nails.  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 No induration; Warm and dry.  Neurologic: Patient is paraplegic cranial nerves II through XII grossly intact. Psychiatric: Normal judgment and insight. Alert and oriented x 3. Normal mood and appropriate affect.   Data Reviewed: I have personally reviewed following labs and imaging studies  CBC: Recent Labs  Lab 07/05/22 2100 07/06/22 0346 07/07/22 0216 07/08/22 0657 07/09/22 0317 07/10/22 0449  WBC 8.9 8.6 9.5 7.0 7.8 8.5  NEUTROABS 6.6  --  8.0* 3.7 4.1 4.4  HGB 10.3* 10.0* 10.1* 9.5* 10.1* 10.5*  HCT 31.8* 31.0* 30.2* 29.1* 30.6* 31.8*  MCV 87.4 88.8 86.8 88.4 86.2 87.8  PLT 304 285 324 321 355 402*   Basic Metabolic Panel: Recent Labs  Lab 07/06/22 0346 07/07/22 0216 07/08/22 0657 07/09/22 0317 07/10/22 0449  NA 136 133* 136 135 133*  K 3.6 3.6 3.4* 3.7 3.6  CL 105 101 103 102 101  CO2 22 22 24 24 22   GLUCOSE 149* 197* 128* 139* 148*  BUN 9 10 11 8 8   CREATININE 0.67 0.71 0.66 0.64 0.60*  CALCIUM 8.3* 8.6* 8.1* 8.5* 8.7*  MG  --  1.8 1.8 2.0 1.9  PHOS  --   --  3.4  --   --     GFR: Estimated Creatinine Clearance: 137.1 mL/min (A) (by C-G formula based on SCr of 0.6 mg/dL (L)). Liver Function Tests: Recent Labs  Lab 07/05/22 2100 07/06/22 0346 07/08/22 0657  AST 41 43*  --   ALT 47* 47*  --   ALKPHOS 65 60  --   BILITOT 0.8 0.7  --   PROT 7.6 7.2  --   ALBUMIN 2.7* 2.6* 2.4*   No results for input(s): "LIPASE", "AMYLASE" in the last 168 hours. No results for input(s): "AMMONIA" in the last 168 hours. Coagulation Profile: Recent Labs  Lab 07/06/22 0146  INR 1.6*   Cardiac Enzymes: Recent Labs  Lab 07/06/22 0124 07/08/22 0657  CKTOTAL 108 57   BNP (last 3 results) No results for input(s): "PROBNP" in the last 8760 hours. HbA1C: No results for input(s): "HGBA1C" in the last 72 hours. CBG: Recent Labs  Lab 07/09/22 2024 07/09/22 2344 07/10/22 0749 07/10/22 0757 07/10/22 1123  GLUCAP 175* 162* 154* 159* 154*   Lipid Profile: No results for input(s): "CHOL", "HDL", "LDLCALC", "TRIG", "CHOLHDL", "LDLDIRECT" in the last 72 hours. Thyroid Function Tests: No results for input(s): "TSH", "T4TOTAL", "FREET4", "T3FREE", "THYROIDAB" in the last 72 hours. Anemia Panel: No results for input(s): "VITAMINB12", "FOLATE", "FERRITIN", "TIBC", "IRON", "RETICCTPCT" in the last 72 hours. Sepsis Labs: Recent Labs  Lab 07/05/22 2100  LATICACIDVEN 1.1    Recent Results (from the past 240 hour(s))  Urine Culture     Status: Abnormal   Collection Time: 07/05/22 10:05 PM   Specimen: Urine, Catheterized  Result Value Ref Range Status   Specimen Description URINE, CATHETERIZED  Final   Special Requests   Final    NONE Performed at [facility], [address], [place], [place] [address]    Culture (A)  Final    >=100,000 COLONIES/mL ESCHERICHIA COLI >=100,000 COLONIES/mL PROTEUS MIRABILIS Confirmed Extended Spectrum Beta-Lactamase Producer (ESBL).  In bloodstream infections from ESBL organisms, carbapenems are preferred over  piperacillin/tazobactam. They are shown  to have a lower risk of mortality.    Report Status 07/08/2022 FINAL  Final   Organism ID, Bacteria ESCHERICHIA COLI (A)  Final   Organism ID, Bacteria PROTEUS MIRABILIS (A)  Final      Susceptibility   Escherichia coli - MIC*    AMPICILLIN >=32 RESISTANT Resistant     CEFAZOLIN >=64 RESISTANT Resistant     CEFEPIME 16 RESISTANT Resistant     CEFTRIAXONE >=64 RESISTANT Resistant     CIPROFLOXACIN >=4 RESISTANT Resistant     GENTAMICIN <=1 SENSITIVE Sensitive     IMIPENEM <=0.25 SENSITIVE Sensitive     NITROFURANTOIN 128 RESISTANT Resistant     TRIMETH/SULFA <=20 SENSITIVE Sensitive     AMPICILLIN/SULBACTAM 8 SENSITIVE Sensitive     PIP/TAZO <=4 SENSITIVE Sensitive     * >=100,000 COLONIES/mL ESCHERICHIA COLI   Proteus mirabilis - MIC*    AMPICILLIN <=2 SENSITIVE Sensitive     CEFAZOLIN <=4 SENSITIVE Sensitive     CEFEPIME <=0.12 SENSITIVE Sensitive     CEFTRIAXONE <=0.25 SENSITIVE Sensitive     CIPROFLOXACIN <=0.25 SENSITIVE Sensitive     GENTAMICIN <=1 SENSITIVE Sensitive     IMIPENEM 4 SENSITIVE Sensitive     NITROFURANTOIN RESISTANT Resistant     TRIMETH/SULFA >=320 RESISTANT Resistant     AMPICILLIN/SULBACTAM <=2 SENSITIVE Sensitive     PIP/TAZO <=4 SENSITIVE Sensitive     * >=100,000 COLONIES/mL PROTEUS MIRABILIS  Aerobic/Anaerobic Culture w Gram Stain (surgical/deep wound)     Status: None   Collection Time: 07/06/22 12:53 PM   Specimen: Path fluid; Body Fluid  Result Value Ref Range Status   Specimen Description WOUND  Final   Special Requests SACRAL  Final   Gram Stain   Final    RARE WBC PRESENT, PREDOMINANTLY MONONUCLEAR FEW GRAM POSITIVE COCCI IN PAIRS FEW GRAM NEGATIVE RODS    Culture   Final    FEW STREPTOCOCCUS CONSTELLATUS Beta hemolytic streptococci are predictably susceptible to penicillin and other beta lactams. Susceptibility testing not routinely performed. RARE STAPHYLOCOCCUS EPIDERMIDIS FEW BACTEROIDES  FRAGILIS BETA LACTAMASE POSITIVE Performed at [facility], [address], [place], [place] [address]    Report Status 07/10/2022 FINAL  Final   Organism ID, Bacteria STAPHYLOCOCCUS EPIDERMIDIS  Final      Susceptibility   Staphylococcus epidermidis - MIC*    CIPROFLOXACIN <=0.5 SENSITIVE Sensitive     ERYTHROMYCIN >=8 RESISTANT Resistant     GENTAMICIN <=0.5 SENSITIVE Sensitive     OXACILLIN >=4 RESISTANT Resistant     TETRACYCLINE <=1 SENSITIVE Sensitive     VANCOMYCIN 2 SENSITIVE Sensitive     TRIMETH/SULFA 160 RESISTANT Resistant     CLINDAMYCIN >=8 RESISTANT Resistant     RIFAMPIN <=0.5 SENSITIVE Sensitive     Inducible Clindamycin NEGATIVE Sensitive     * RARE STAPHYLOCOCCUS EPIDERMIDIS    Radiology Studies: No results found.  Scheduled Meds:  amoxicillin-clavulanate  1 tablet Oral Q12H   apixaban  5 mg Oral BID   bisacodyl  10 mg Rectal Daily   Chlorhexidine Gluconate Cloth  6 each Topical Daily   docusate sodium  100 mg Oral BID   doxycycline  100 mg Oral Q12H   FLUoxetine  20 mg Oral Daily   gabapentin  600 mg Oral TID   insulin aspart  0-9 Units Subcutaneous TID WC   Continuous Infusions:   LOS: 4 days   Marguerita Merles, DO [facility] Available via Epic secure chat 7am-7pm After these hours,  please refer to coverage provider listed on [email or web address] 07/10/2022, 3:45 PM

## 2022-07-10 NOTE — TOC Progression Note (Addendum)
Transition of Care [facility]) - Progression Note    Patient Details  Name: [name] MRN: [id number] Date of Birth: 03-Jun-1965  Transition of [facility] CM/SW Contact  Epifanio Lesches, RN Phone Number: 07/10/2022, 3:32 PM  Clinical Narrative:    NCM spoke with pt @ bedside with wife and interpreter, Fleet Contras [phone] ( pt speaks spanish). Wife is pt's primary caregiver. Pt with sacral wounds, s/p I&D 5/25. [id number] and diabetes mellitus type 2 . Wife requesting a low air loss mattress for home. States hoyer lift is mal functioned @ home. Wife is unsure of provider for dme hospital bed and hoyer lift @ home. Information is needed in order to made a referral for low air loss mattress, and to  fix malfunctioned hoyer lift. Referral will have be made with the provider that supplied hospital bed/ hoyer lift. [facility] checking to see if pt active with them. Wife to f/u with son ... Pt will probably need home health services @ d/c ( RN,PT,OT). Pt/wife agreeable to home health services, no provider preference. Has been active with [facility] in the past. Referral made with Ephriam Knuckles for home health services pending MD's order, voice message left.  07/10/2022 @ 2024 Called received from Jason/[facility] , informing NCM they have never provided pt DME.   TOC following and will continue assisting with needs...  Expected Discharge Plan: Home w Home Health Services Barriers to Discharge: Continued Medical Work up  Expected Discharge Plan and Services       Living arrangements for the past 2 months: Single Family Home                                       Social Determinants of Health (SDOH) Interventions SDOH Screenings   Depression (PHQ2-9): Low Risk  (03/01/2022)  Tobacco Use: Low Risk  (07/07/2022)    Readmission Risk Interventions     No data to display

## 2022-07-10 NOTE — Progress Notes (Signed)
Physical Therapy Treatment Patient Details Name: [name] MRN: [id number] DOB: 19-Jul-1965 Today's Date: 07/10/2022   History of Present Illness Patient is a 57 y.o. male presented to the ED with fevers; workup concerning for a infected decubitus ulcer. General surgery consulted and patient underwent irrigation and debridement of sacral wound with necrotic purulent tissue removed and cultures sent.  Patient placed empirically on IV vancomycin and Zosyn.  ID also consulted. PMH significant for DM, C7 spinal fracture s/p surgery no with paraplegia, PE/DVT s/p IVC filter, neurogenic bladder with chronic Foley, 5th ray amputation.    PT Comments    Patient resting in bed on air mattress bed with PRAFO's in place and SCD's on at start of session. Pt agreeable to education and LE ROM with PT this session. In house interpreter utilized throughout session, Raquel, to facilitate education for pt and spouse. Introduced leg loops for self positioning in bed and WC and pt able to complete some ROM to bil LE's Rt easer to complete than Lt. Instructed pt's spouse on PROM for bil LE's via demonstration and practice. Discussed goals of completing self repositioning and benefits of circle sitting and figure four position for self care and ADL's as LTG's for pt with goal to work towards these goals with [facility] therapy and RN services. Portion of time spent communicating with [employer] rep to schedule a visit tomorrow (planned for 12-12:30) with Harrie Jeans of [employer], Raquel [facility] spanish interpreter is set up to be present and facilitate communication between pt/family and WC rep. Discussion also had with CM, Gae Gallop, who will facilitate set up of [facility] services and DME updates as pt's Michiel Sites Lift is not functioning at home and old hospital bed does not have low air loss surface to promote wound healing and is not automatic impacting caregivers ability to safely assist pt with mobility. Per request for updated WC cushion  medical documentation sent to [employer] rep to begin DME approval. EOS bil PRAFO's and SCD's reapplied and call bell and bed controls within reach for patient. Provided handout for pt to acquire leg loops, will introduce bed ladder next visit and progress pt as able.  Contact Numbers for pt care: Harrie Jeans [employer] - [phone] [facility] on campus interpreter - [phone]   Recommendations for follow up therapy are one component of a multi-disciplinary discharge planning process, led by the attending physician.  Recommendations may be updated based on patient status, additional functional criteria and insurance authorization.  Follow Up Recommendations       Assistance Recommended at Discharge Frequent or constant Supervision/Assistance  Patient can return home with the following Two people to help with walking and/or transfers;A lot of help with bathing/dressing/bathroom;Assistance with cooking/housework;Direct supervision/assist for medications management;Assist for transportation;Help with stairs or ramp for entrance   Equipment Recommendations  Hospital bed;Other (comment) (HH for DME evaluation needs; new mechanical lift, hospital bed with air mattress, [employer] WC consult for new pressure relieving Roho air cushion.)    Recommendations for Other Services       Precautions / Restrictions Precautions Precautions: Fall Precaution Comments: sacral wound Restrictions Weight Bearing Restrictions: No     Mobility  Bed Mobility Overal bed mobility: Needs Assistance Bed Mobility: Rolling           General bed mobility comments: pt now on low pressure air mattress and bed assist for truns Rt/Lt to offload wound/protect skin. bed placed in trendelenburg and pt able to use bil UE's to pull up on head board and  boost superior in bed demonstrating excellent UE strength.    Transfers                   General transfer comment: Total assist for mechanical lift at baseline     Ambulation/Gait                   Stairs             Wheelchair Mobility    Modified Rankin (Stroke Patients Only)          Cognition Arousal/Alertness: Awake/alert Behavior During Therapy: WFL for tasks assessed/performed Overall Cognitive Status: Within Functional Limits for tasks assessed           Exercises Other Exercises Other Exercises: Leg Loops Education: demonstration and instruction provided to pt and spouse on donning/doffing loops. With bed adjsuted to chair position pt able to reach wiht bil UE to Rt or Lt leg straps and complete self assisted PROM to hamstring stretch, knee flexion ROM, hip abduction, and combined hip abduction/external rotation/flexion to facilitate circule sitting. pt educated on benefits of circle sitting to be able to access periarea for self care with self catheterization. Pt/spouse reports he was educated on this at time of injury but had difficulty due to abdomen and hip tightness. Lt hip ROM more limited than Rt. Other Exercises: Reviewed PROM to Bil LE's with pt's wife performing on Lt LE following demonstration from therapist on Rt LE and with cues for safe body positioning to protect self. Bed fuunction for elevating/lowering not working well and discussed that with adjusted bed height it will be easier on pt as well. Other Exercises: Plan to Provide shorter spanish handout for PROM to LE's for spouse/caregiver to assist with. [facility]) pt will also benefit from resistance bands for UE strengthening HEP.    General Comments        Pertinent Vitals/Pain Pain Assessment Pain Assessment: No/denies pain     PT Goals (current goals can now be found in the care plan section) Acute Rehab PT Goals Patient Stated Goal: get equipment updated and increased assist with home aide, wound to heal PT Goal Formulation: With patient/family Time For Goal Achievement: 07/22/22 Potential to Achieve Goals: Fair Progress towards PT  goals: Progressing toward goals    Frequency    Min 2X/week      PT Plan Current plan remains appropriate    Co-evaluation              AM-PAC PT "6 Clicks" Mobility   Outcome Measure  Help needed turning from your back to your side while in a flat bed without using bedrails?: Total Help needed moving from lying on your back to sitting on the side of a flat bed without using bedrails?: Total Help needed moving to and from a bed to a chair (including a wheelchair)?: Total Help needed standing up from a chair using your arms (e.g., wheelchair or bedside chair)?: Total Help needed to walk in hospital room?: Total Help needed climbing 3-5 steps with a railing? : Total 6 Click Score: 6    End of Session Equipment Utilized During Treatment:  (leg loops) Activity Tolerance: Patient tolerated treatment well Patient left: in bed;with call bell/phone within reach;with family/visitor present;with bed alarm set;with SCD's reapplied (PRAFOS on) Nurse Communication: Mobility status PT Visit Diagnosis: Muscle weakness (generalized) (M62.81);Other abnormalities of gait and mobility (R26.89);Other symptoms and signs involving the nervous system (Y86.578)     Time: 4696-2952 PT Time Calculation (  min) (ACUTE ONLY): 60 min  Charges:  $Therapeutic Exercise: 8-22 mins $Self Care/Home Management: 23-37                      Wynn Maudlin, DPT Acute Rehabilitation Services Office [phone]  07/10/22 4:31 PM

## 2022-07-10 NOTE — Progress Notes (Signed)
Dressing change performed to sacral wound per order. Pt tolerated with no problem. Premedicated prior to dressing change.

## 2022-07-10 NOTE — Progress Notes (Signed)
[facility]  Date of Admission:  07/05/2022     Total days of antibiotics 6         ASSESSMENT:  Mr. Grigas is POD #3 from debridement with cultures growing Streptococcus constellatus, Staphylococcus epidermidis (resistant), and Bacteroides fragilis. Discussed plan of care recommendations to include 4 weeks of oral antibiotics with Augmentin and doxycyline. Emphasized the importance of optimizing nutrition and frequent turning as he remains at high risk for complicated healing and potential for further infection. Continue wound care per General Surgery recommendations. Will arrange follow up in ID office. Remaining medical and supportive care per Internal Medicine.   PLAN:  Change antibiotics to Augmentin and Doxycyline until 08/03/22 Continue wound care per General Surgery recommendations. Optimize site off loading and nutrition.  Follow up in ID office on 08/02/22 at 10:30am Remaining medical and supportive care per Internal Medicine.  ID will sign off. Please re-consult if needed.   I have personally 25 spent  minutes involved in face-to-face and non-face-to-face activities for this patient on the day of the visit. Professional time spent includes the following activities: Preparing to see the patient (review of tests), Obtaining and/or reviewing separately obtained history (admission/discharge record), Performing a medically appropriate examination and/or evaluation , Ordering medications/tests/procedures, referring and communicating with other health care professionals, Documenting clinical information in the EMR, Independently interpreting results (not separately reported), Communicating results to the patient/family/caregiver, Counseling and educating the patient/family/caregiver and Care coordination (not separately reported).    Principal Problem:   Cellulitis, gluteal, left Active Problems:   Quadriplegia (HCC)   Presence of IVC filter   Sepsis due  to cellulitis (HCC)   DM2 (diabetes mellitus, type 2) (HCC)   History of DVT (deep vein thrombosis)   Acute cystitis with hematuria   Hypertension   Paraplegia (HCC)    apixaban  5 mg Oral BID   bisacodyl  10 mg Rectal Daily   Chlorhexidine Gluconate Cloth  6 each Topical Daily   docusate sodium  100 mg Oral BID   FLUoxetine  20 mg Oral Daily   gabapentin  600 mg Oral TID   insulin aspart  0-9 Units Subcutaneous TID WC    SUBJECTIVE:  Afebrile overnight with no acute events. Feeling well and tolerating antibiotics. Wife at bedside. Medical interpreter present via tablet to aid in communication.   No Known Allergies   Review of Systems: Review of Systems  Constitutional:  Negative for chills, fever and weight loss.  Respiratory:  Negative for cough, shortness of breath and wheezing.   Cardiovascular:  Negative for chest pain and leg swelling.  Gastrointestinal:  Negative for abdominal pain, constipation, diarrhea, nausea and vomiting.  Skin:  Negative for rash.       Positive for sacral wound      OBJECTIVE: Vitals:   07/09/22 0727 07/09/22 2025 07/10/22 0417 07/10/22 0750  BP: 107/72 115/65 113/79 135/80  Pulse: 68 88 74 75  Resp: 17 13 18 15   Temp: 97.9 F (36.6 C) 98 F (36.7 C) 97.9 F (36.6 C) 98 F (36.7 C)  TempSrc: Oral Oral  Oral  SpO2: 98% 95% 93% 96%  Weight:      Height:       Body mass index is 42.58 kg/m.  Physical Exam Constitutional:      General: He is not in acute distress.    Appearance: He is well-developed.     Comments: Lying in bed with head of bed elevated; pleasant.  Cardiovascular:     Rate and Rhythm: Normal rate and regular rhythm.     Heart sounds: Normal heart sounds.  Pulmonary:     Effort: Pulmonary effort is normal.     Breath sounds: Normal breath sounds.  Skin:    General: Skin is warm and dry.  Neurological:     Mental Status: He is alert and oriented to person, place, and time.  Psychiatric:        Mood and  Affect: Mood normal.     Lab Results Lab Results  Component Value Date   WBC 8.5 07/10/2022   HGB 10.5 (L) 07/10/2022   HCT 31.8 (L) 07/10/2022   MCV 87.8 07/10/2022   PLT 402 (H) 07/10/2022    Lab Results  Component Value Date   CREATININE 0.60 (L) 07/10/2022   BUN 8 07/10/2022   NA 133 (L) 07/10/2022   K 3.6 07/10/2022   CL 101 07/10/2022   CO2 22 07/10/2022    Lab Results  Component Value Date   ALT 47 (H) 07/06/2022   AST 43 (H) 07/06/2022   ALKPHOS 60 07/06/2022   BILITOT 0.7 07/06/2022     Microbiology: Recent Results (from the past 240 hour(s))  Urine Culture     Status: Abnormal   Collection Time: 07/05/22 10:05 PM   Specimen: Urine, Catheterized  Result Value Ref Range Status   Specimen Description URINE, CATHETERIZED  Final   Special Requests   Final    NONE Performed at [facility], [address], [place], [place] [address]    Culture (A)  Final    >=100,000 COLONIES/mL ESCHERICHIA COLI >=100,000 COLONIES/mL PROTEUS MIRABILIS Confirmed Extended Spectrum Beta-Lactamase Producer (ESBL).  In bloodstream infections from ESBL organisms, carbapenems are preferred over piperacillin/tazobactam. They are shown to have a lower risk of mortality.    Report Status 07/08/2022 FINAL  Final   Organism ID, Bacteria ESCHERICHIA COLI (A)  Final   Organism ID, Bacteria PROTEUS MIRABILIS (A)  Final      Susceptibility   Escherichia coli - MIC*    AMPICILLIN >=32 RESISTANT Resistant     CEFAZOLIN >=64 RESISTANT Resistant     CEFEPIME 16 RESISTANT Resistant     CEFTRIAXONE >=64 RESISTANT Resistant     CIPROFLOXACIN >=4 RESISTANT Resistant     GENTAMICIN <=1 SENSITIVE Sensitive     IMIPENEM <=0.25 SENSITIVE Sensitive     NITROFURANTOIN 128 RESISTANT Resistant     TRIMETH/SULFA <=20 SENSITIVE Sensitive     AMPICILLIN/SULBACTAM 8 SENSITIVE Sensitive     PIP/TAZO <=4 SENSITIVE Sensitive     * >=100,000 COLONIES/mL ESCHERICHIA COLI   Proteus mirabilis - MIC*     AMPICILLIN <=2 SENSITIVE Sensitive     CEFAZOLIN <=4 SENSITIVE Sensitive     CEFEPIME <=0.12 SENSITIVE Sensitive     CEFTRIAXONE <=0.25 SENSITIVE Sensitive     CIPROFLOXACIN <=0.25 SENSITIVE Sensitive     GENTAMICIN <=1 SENSITIVE Sensitive     IMIPENEM 4 SENSITIVE Sensitive     NITROFURANTOIN RESISTANT Resistant     TRIMETH/SULFA >=320 RESISTANT Resistant     AMPICILLIN/SULBACTAM <=2 SENSITIVE Sensitive     PIP/TAZO <=4 SENSITIVE Sensitive     * >=100,000 COLONIES/mL PROTEUS MIRABILIS  Aerobic/Anaerobic Culture w Gram Stain (surgical/deep wound)     Status: None   Collection Time: 07/06/22 12:53 PM   Specimen: Path fluid; Body Fluid  Result Value Ref Range Status   Specimen Description WOUND  Final   Special Requests SACRAL  Final   Gram Stain   Final    RARE WBC PRESENT, PREDOMINANTLY MONONUCLEAR FEW GRAM POSITIVE COCCI IN PAIRS FEW GRAM NEGATIVE RODS    Culture   Final    FEW STREPTOCOCCUS CONSTELLATUS Beta hemolytic streptococci are predictably susceptible to penicillin and other beta lactams. Susceptibility testing not routinely performed. RARE STAPHYLOCOCCUS EPIDERMIDIS FEW BACTEROIDES FRAGILIS BETA LACTAMASE POSITIVE Performed at [facility], [address], [place], [place] [address]    Report Status 07/10/2022 FINAL  Final   Organism ID, Bacteria STAPHYLOCOCCUS EPIDERMIDIS  Final      Susceptibility   Staphylococcus epidermidis - MIC*    CIPROFLOXACIN <=0.5 SENSITIVE Sensitive     ERYTHROMYCIN >=8 RESISTANT Resistant     GENTAMICIN <=0.5 SENSITIVE Sensitive     OXACILLIN >=4 RESISTANT Resistant     TETRACYCLINE <=1 SENSITIVE Sensitive     VANCOMYCIN 2 SENSITIVE Sensitive     TRIMETH/SULFA 160 RESISTANT Resistant     CLINDAMYCIN >=8 RESISTANT Resistant     RIFAMPIN <=0.5 SENSITIVE Sensitive     Inducible Clindamycin NEGATIVE Sensitive     * RARE STAPHYLOCOCCUS EPIDERMIDIS     Marcos Eke, NP [facility]  07/10/2022  10:02 AM

## 2022-07-11 DIAGNOSIS — N3001 Acute cystitis with hematuria: Secondary | ICD-10-CM | POA: Diagnosis not present

## 2022-07-11 DIAGNOSIS — L03317 Cellulitis of buttock: Secondary | ICD-10-CM | POA: Diagnosis not present

## 2022-07-11 DIAGNOSIS — E119 Type 2 diabetes mellitus without complications: Secondary | ICD-10-CM | POA: Diagnosis not present

## 2022-07-11 DIAGNOSIS — Z86718 Personal history of other venous thrombosis and embolism: Secondary | ICD-10-CM | POA: Diagnosis not present

## 2022-07-11 LAB — GLUCOSE, CAPILLARY
Glucose-Capillary: 134 mg/dL — ABNORMAL HIGH (ref 70–99)
Glucose-Capillary: 135 mg/dL — ABNORMAL HIGH (ref 70–99)
Glucose-Capillary: 144 mg/dL — ABNORMAL HIGH (ref 70–99)
Glucose-Capillary: 145 mg/dL — ABNORMAL HIGH (ref 70–99)
Glucose-Capillary: 149 mg/dL — ABNORMAL HIGH (ref 70–99)
Glucose-Capillary: 163 mg/dL — ABNORMAL HIGH (ref 70–99)
Glucose-Capillary: 165 mg/dL — ABNORMAL HIGH (ref 70–99)

## 2022-07-11 LAB — CBC WITH DIFFERENTIAL/PLATELET
Abs Immature Granulocytes: 0.32 10*3/uL — ABNORMAL HIGH (ref 0.00–0.07)
Basophils Absolute: 0.1 10*3/uL (ref 0.0–0.1)
Basophils Relative: 1 %
Eosinophils Absolute: 0.4 10*3/uL (ref 0.0–0.5)
Eosinophils Relative: 4 %
HCT: 32.9 % — ABNORMAL LOW (ref 39.0–52.0)
Hemoglobin: 10.7 g/dL — ABNORMAL LOW (ref 13.0–17.0)
Immature Granulocytes: 4 %
Lymphocytes Relative: 32 %
Lymphs Abs: 2.9 10*3/uL (ref 0.7–4.0)
MCH: 27.9 pg (ref 26.0–34.0)
MCHC: 32.5 g/dL (ref 30.0–36.0)
MCV: 85.9 fL (ref 80.0–100.0)
Monocytes Absolute: 0.5 10*3/uL (ref 0.1–1.0)
Monocytes Relative: 6 %
Neutro Abs: 4.8 10*3/uL (ref 1.7–7.7)
Neutrophils Relative %: 53 %
Platelets: 391 10*3/uL (ref 150–400)
RBC: 3.83 MIL/uL — ABNORMAL LOW (ref 4.22–5.81)
RDW: 13.7 % (ref 11.5–15.5)
WBC: 9 10*3/uL (ref 4.0–10.5)
nRBC: 0 % (ref 0.0–0.2)

## 2022-07-11 LAB — PHOSPHORUS: Phosphorus: 3.5 mg/dL (ref 2.5–4.6)

## 2022-07-11 LAB — COMPREHENSIVE METABOLIC PANEL
ALT: 31 U/L (ref 0–44)
AST: 27 U/L (ref 15–41)
Albumin: 3 g/dL — ABNORMAL LOW (ref 3.5–5.0)
Alkaline Phosphatase: 60 U/L (ref 38–126)
Anion gap: 11 (ref 5–15)
BUN: 8 mg/dL (ref 6–20)
CO2: 24 mmol/L (ref 22–32)
Calcium: 8.9 mg/dL (ref 8.9–10.3)
Chloride: 99 mmol/L (ref 98–111)
Creatinine, Ser: 0.58 mg/dL — ABNORMAL LOW (ref 0.61–1.24)
GFR, Estimated: >60 mL/min (ref >60)
Glucose, Bld: 146 mg/dL — ABNORMAL HIGH (ref 70–99)
Potassium: 3.6 mmol/L (ref 3.5–5.1)
Sodium: 134 mmol/L — ABNORMAL LOW (ref 135–145)
Total Bilirubin: 0.6 mg/dL (ref 0.3–1.2)
Total Protein: 7.5 g/dL (ref 6.5–8.1)

## 2022-07-11 LAB — MAGNESIUM: Magnesium: 1.9 mg/dL (ref 1.7–2.4)

## 2022-07-11 NOTE — Consult Note (Addendum)
WOC Nurse Consult Note: Reason for Consult:Nursing has requested guidance for wound care.Surgery debrided this Stage 4 pressure injury and has provided wound care orders for twice daily NS wet to dry dressings. Surgery signed off on 5/28. Please see surgery notes. [facility] PA-C K. Laural Benes also indicates that the patient's wife has been instructed on wound care and provides measurements for wound on that day. Wound type:Pressure Pressure Injury POA: Yes Measurement:Per Ms. Johnson, 10cm x 7cm x 5cm with 1cm of undermining inferiorly toward rectum. Wound ZOX:WRUEAV palpable at base of wound Drainage (amount, consistency, odor) Small serosanguinous Periwound:intact Dressing procedure/placement/frequency: The orders placed by surgery are in place and there is no role for WOC nursing at this time. Note that [facility] has recommended [facility] and referral to the outpatient wound care center for ongoing follow up.  WOC nursing team will not follow, but will remain available to this patient, the nursing and medical teams.  Please re-consult if needed.  Thank you for inviting [place] to participate in this patient's Plan of Care.  Ladona Mow, MSN, RN, CNS, GNP, Leda Min, [employer], [employer] phone:  [phone]

## 2022-07-11 NOTE — TOC Progression Note (Addendum)
Transition of Care [facility]) - Progression Note    Patient Details  Name: [name] MRN: [id number] Date of Birth: February 14, 1965  Transition of Care [facility] CM/SW Contact  Lorri Frederick, LCSW Phone Number: 07/11/2022, 1:32 PM  Clinical Narrative:   CSW spoke with pt and [name] through [employer].  Wife provided several phone numbers: Hospital bed came from [employer] [facility]: [phone] Wife also spoke with a navigator for the insurance: [place], [phone], [phone].  Per Meriam Sprague, the mattress for the bed could also be ordered from [employer], [phone].   CSW also brought up the possibility of attempting to set up a SNF admission for pt to have wound care and PT/OT there.  Pt states that if it is possible for him to return home with treatment there, that is his first choice.    Expected Discharge Plan: Home w Home Health Services Barriers to Discharge: Continued Medical Work up  Expected Discharge Plan and Services       Living arrangements for the past 2 months: Single Family Home                           HH Arranged: PT, RN, OT [facility] Agency: [facility] Date [facility] Agency Contacted: 07/11/22 Time HH Agency Contacted: 1208 Representative spoke with at [facility] Agency: Tresa Endo   Social Determinants of Health (SDOH) Interventions SDOH Screenings   Depression (PHQ2-9): Low Risk  (03/01/2022)  Tobacco Use: Low Risk  (07/07/2022)    Readmission Risk Interventions     No data to display

## 2022-07-11 NOTE — Progress Notes (Signed)
Occupational Therapy Treatment Note    07/11/22 1424  OT Visit Information  Last OT Received On 07/11/22  Assistance Needed +1  History of Present Illness Patient is a 57 y.o. male presented to the ED with fevers; workup concerning for a infected decubitus ulcer. General surgery consulted and patient underwent irrigation and debridement of sacral wound with necrotic purulent tissue removed and cultures sent.  Patient placed empirically on IV vancomycin and Zosyn.  ID also consulted. PMH significant for DM, C7 spinal fracture s/p surgery no with paraplegia, PE/DVT s/p IVC filter, neurogenic bladder with chronic Foley, 5th ray amputation.  Precautions  Precautions Fall  Precaution Comments pressure wound/ischium  Pain Assessment  Pain Assessment 0-10  Cognition  Arousal/Alertness Awake/alert  Behavior During Therapy WFL for tasks assessed/performed  Overall Cognitive Status Within Functional Limits for tasks assessed  ADL  General ADL Comments discussed use of leg loops to help with ADL; discussed use of bed ladder; picture shown and handout regarding availability provided; pt/wife verbalized understanding. Encouraged pt to use rial/loops to help with self care, promote independence and decrease burden of care on wife. Also discussed pericare adn use of bed pads/hoyer pad. Wife states she removes hoyer after lifting Tien to his wc. educated on importnace of not wearing brief in wc as he is using an in-dwelling foley to improve pressure relief. Discussed using a light weight disposable pad and limiting brief use for when pt has a BM only, to reduce moisture on skin and allow for therapeutic aspect of air mattress. Again emphasized max 2 hr in chair at this time to promote healing of wound. Wound nurse can increase time in chair as wound heals. Pt/wife verbalized understanding. Pt has B PRAFO to be worn when in bed. also disucssed importance of being clear about their expectations of HHOT/PT and once  goals achieved, saving visits for outpt services. Pt/wife verbalized understanding.  OT Assessment/Plan  OT Plan Discharge plan remains appropriate  OT Visit Diagnosis Other abnormalities of gait and mobility (R26.89)  OT Frequency (ACUTE ONLY) Min 2X/week  Follow Up Recommendations Home health OT  Assistance recommended at discharge Frequent or constant Supervision/Assistance  Patient can return home with the following A lot of help with walking and/or transfers;A lot of help with bathing/dressing/bathroom  OT Equipment Hospital bed;Other (comment)  AM-PAC OT "6 Clicks" Daily Activity Outcome Measure (Version 2)  Help from another person eating meals? 4  Help from another person taking care of personal grooming? 3  Help from another person toileting, which includes using toliet, bedpan, or urinal? 1  Help from another person bathing (including washing, rinsing, drying)? 2  Help from another person to put on and taking off regular upper body clothing? 2  Help from another person to put on and taking off regular lower body clothing? 1  6 Click Score 13  Progressive Mobility  What is the highest level of mobility based on the progressive mobility assessment? Level 1 (Bedfast) - Unable to balance while sitting on edge of bed  Mobility Referral No  Activity Moved into chair position in bed  OT Goal Progression  Progress towards OT goals Progressing toward goals  Acute Rehab OT Goals  Patient Stated Goal to get the equpiment he needs before going home  OT Goal Formulation With patient/family  Time For Goal Achievement 07/22/22  Potential to Achieve Goals Good  ADL Goals  Pt Will Perform Lower Body Bathing with mod assist;with adaptive equipment  Pt/caregiver will Perform Home Exercise  Program Increased strength;Both right and left upper extremity;With theraband;Independently  Additional ADL Goal #1 Pt will use leg loops/rail loops to help with rolling side - side and bed mobility for  pressure relief  OT Time Calculation  OT Start Time (ACUTE ONLY) 1235  OT Stop Time (ACUTE ONLY) 1305  OT Time Calculation (min) 30 min  OT General Charges  $OT Visit 1 Visit  OT Treatments  $Self Care/Home Management  23-37 mins   Luisa Dago, OT/L   Acute OT Clinical Specialist Acute Rehabilitation Services Pager [phone] Office [phone]

## 2022-07-11 NOTE — Progress Notes (Addendum)
Occupational Therapy Treatment Patient Details Name: [name] MRN: [id number] DOB: [birth year] Today's Date: 07/11/2022   History of present illness Patient is a 57 y.o. male presented to the ED with fevers; workup concerning for a infected decubitus ulcer. General surgery consulted and patient underwent irrigation and debridement of sacral wound with necrotic purulent tissue removed and cultures sent.  Patient placed empirically on IV vancomycin and Zosyn.  ID also consulted. PMH significant for DM, C7 spinal fracture s/p surgery no with paraplegia, PE/DVT s/p IVC filter, neurogenic bladder with chronic Foley, 5th ray amputation.   OT comments  Pt/wife seen in conjunction with [name] from [employer] with interpreter present. Focus of session on proper positioning and use of power chair to offset pressure q 30 min for 15 minutes with max amount of time in chair @ 2 hours. [name] provided [employer] cushion and has begun the process of ordering [name] a ROHO for his chair. Pt/wife instructed in proper use of ROHO and how to determine if cushion needs to be inflated/deleted. Also educated on importance of reducing shear forces by using the "tilt" feature on the power chair rather than using the recline/foot elevate button. [name] educated on preset use of "M3" function on toggle switch to run through correct pressure relief sequence. Used Teach back to assure pt/wife understood and wife completed sequence while therapist sat in wc. Pt/wife were given contact card for [employer]; HH visit for next Wednesday with [employer] to assure follow through with correct use of power chair and ROHO to facilitate pressure relief for wound healing. Will continue to follow. Pt/wife very appreciative.    Recommendations for follow up therapy are one component of a multi-disciplinary discharge planning process, led by the attending physician.  Recommendations may be updated based on patient status, additional  functional criteria and insurance authorization.    Assistance Recommended at Discharge Frequent or constant Supervision/Assistance  Patient can return home with the following  A lot of help with walking and/or transfers;A lot of help with bathing/dressing/bathroom   Equipment Recommendations  Hospital bed;Other (comment) (Air mattress; RoHo w/c cushion)    Recommendations for Other Services      Precautions / Restrictions Precautions Precautions: Fall Precaution Comments: pressure wound/ischium       Mobility Bed Mobility                    Transfers                         Balance                                           ADL either performed or assessed with clinical judgement   ADL                                         General ADL Comments: discussed use of leg loops to help with ADL; discussed use of bed ladder; picture shown and handout regarding availability provided; pt/wife verbalized understanding.    Extremity/Trunk Assessment Upper Extremity Assessment Upper Extremity Assessment: Overall WFL for tasks assessed            Vision       Perception     Praxis  Cognition Arousal/Alertness: Awake/alert Behavior During Therapy: WFL for tasks assessed/performed Overall Cognitive Status: Within Functional Limits for tasks assessed                                          Exercises      Shoulder Instructions       General Comments education regarding pressure relief and positioning our of bed to promote wound healing    Pertinent Vitals/ Pain       Pain Assessment Pain Assessment: No/denies pain  Home Living                                          Prior Functioning/Environment              Frequency  Min 2X/week        Progress Toward Goals  OT Goals(current goals can now be found in the care plan section)  Progress towards OT  goals: Progressing toward goals  Acute Rehab OT Goals Patient Stated Goal: to get the equipmnet he needs OT Goal Formulation: With patient/family Time For Goal Achievement: 07/22/22 Potential to Achieve Goals: Good ADL Goals Pt Will Perform Lower Body Bathing: with mod assist;with adaptive equipment Pt/caregiver will Perform Home Exercise Program: Increased strength;Both right and left upper extremity;With theraband;Independently Additional ADL Goal #1: Pt will use leg loops/rail loops to help with rolling side - side and bed mobility for pressure relief  Plan Discharge plan remains appropriate    Co-evaluation                 AM-PAC OT "6 Clicks" Daily Activity     Outcome Measure   Help from another person eating meals?: None Help from another person taking care of personal grooming?: A Little Help from another person toileting, which includes using toliet, bedpan, or urinal?: Total Help from another person bathing (including washing, rinsing, drying)?: A Lot Help from another person to put on and taking off regular upper body clothing?: A Lot Help from another person to put on and taking off regular lower body clothing?: Total 6 Click Score: 13    End of Session    OT Visit Diagnosis: Other abnormalities of gait and mobility (R26.89)   Activity Tolerance Patient tolerated treatment well   Patient Left in bed;with call bell/phone within reach;with family/visitor present   Nurse Communication Mobility status;Need for lift equipment        Time: [phone] OT Time Calculation (min): 29 min  Charges: OT General Charges $OT Visit: 1 Visit OT Treatments $Self Care/Home Management : 23- 37 mins  Luisa Dago, OT/L   Acute OT Clinical Specialist Acute Rehabilitation Services Pager [phone] Office [phone]   [facility] 07/11/2022, 2:16 PM

## 2022-07-11 NOTE — Progress Notes (Signed)
Dsg change done to sacral wound with the assistance of wife (at bedside). Wife instructed how to perform dsg change. Verbalized understanding. All of wife's questions answered to her satisfaction.

## 2022-07-11 NOTE — Progress Notes (Signed)
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  Workup was concerning for infected decubitus ulcer.  CT scan of the abdomen pelvis was done and showed a left medial gluteal cleft tissue emphysema with overlying dermal thickening and fat stranding concerning for infection.  He has a known obstructive left-sided nephrolithiasis with colonic diverticulosis.  General surgery was consulted as well as infectious diseases and patient underwent irrigation and debridement of the sacral wound with necrotic purulent tissue removed and cultures being sent.  He was placed on empiric antibiotics with IV vancomycin and Zosyn and ID was consulted for further adjustments for his antibiotics.  His antibiotics have now been de-escalated to oral antibiotics and surgery is sign of the case.  TOC assisting with discharge disposition and will need to figure out if the amount of wound care that he requires can be done at home or if he will need to go to SNF.  He is now being set up for home health PT OT and RN and nursing educating wife and dressing changes as well as ordering a home health nurse.  Ordering a low loss air mattress for the patient and home health to evaluate see the patient Monday.  Anticipating discharge in the next 24 hours  Assessment and Plan:  Infected Sacral Decubitus Ulcer -Status post excisional debridement of sacral wound per Dr. Violeta Gelinas 07/06/2022 with necrotic purulent tissue removed. -Preliminary cultures predominantly mononuclear with few gram-positive cocci in pairs, few  gram-negative rods and now Growth as below -Patient was on IV vancomycin, IV Zosyn on admission. -ID consulted patient seen in consultation by Dr. Daiva Eves and IV vancomycin discontinued and changed to IV daptomycin and recommending continuation of Zosyn which was de-escalated to IV Unasyn yesterday  and now changed to oral Abx with Doxycycline and Augment -Per ID patient with no exposed bone however recommending a protracted oral antibiotics if organisms can be pinned down and are sensitive to oral antibiotics. -General surgery reassessed patient, feel wound is clean, no active infection or significant left necrotic tissue at this time and recommended local wound care.  No plans for any further procedure per general surgery. -General surgery has placed home health RN orders and recommended referral to wound care center on discharge.  General surgery signed off as of 07/09/2022. -ID now signed off the case -Will see if he can get the extensive Wound care Required at home or if will need to go to SNF; [facility] assisting with Discharge Disposition to see if extensive Wound care can be done or if he will need SNF.  They are now setting him up for home health with PT OT and RN and ordering him a low loss air mattress as well as attempting to get him a new Hoyer lift.  Anticipate discharging patient is on 07/12/2022 once equipment is delivered -Will obtain WOC nurse evaluation however there are orders placed by surgery in place and the WOC does not feel there is a role for her at this time -Continue with pain control with acetaminophen 650 mg p.o. see every 6h as needed mild pain or fever,  oxycodone 5 mg p.o. every 8 as needed for moderate and severe pain, IV morphine 2 mg every 2 as needed for severe and moderate pain and supportive care with ondansetron 4 mg p.o./IV every 6 as needed -Continue with bowel regimen with bisacodyl 10 mg rectally daily as well as docusate 100 mg p.o. every 12h    UTI versus  bacteriuria/colonization -Patient with chronic indwelling Foley catheter with paraplegia. -Urinalysis done with large leukocytes, nitrite negative, many bacteria, WBC > 50.  -Urine cultures with > 100,000 colonies of ESBL E. coli and Proteus Mirabilis. -Patient seen in consultation by ID who feel could likely be a colonization in the patient with chronic indwelling Foley catheter and feel patient source of infection more from the sacral decubitus ulcer. -Patient empirically on IV antibiotics below and now being transitioned to oral Abx -ID following and change antibiotics as below   History of DVT -Patient being followed by general surgery and no plans for any further procedures so Full Dose Lovenox was discontinued and resumed home regimen Eliquis.     Sepsis secondary to sacral decubitus ulcer s/p I and D POD #4 -Patient on admission met criteria for sepsis due to tachycardia tachypnea and source of infection. -Status post I&D with preliminary surgical cultures few Streptococcus constellatus, Staphylococcus epidermis, and Bacteroides -Was on on IV daptomycin, IV Zosyn, patient being followed by ID and IV Zosyn has been narrowed to IV Unasyn today today now ID is recommending 4 weeks of oral antibiotics of Augmentin and Doxycycline -Appreciate ID input and recommendations.   -Neurosurgery evaluated and feel that the wound is clean with no active infection and no significant necrotic tissue and they recommended continuing local wound care and have signed off the case -ID recommends optimizing site offloading and nutrition -ID recommends following up in ID clinic on 08/02/2022 at 10:30 AM   History of IVC filter -Patient high risk for DVT and PE. -Was on Eliquis which was held in anticipation of procedure but has now been resumed on the evening of 07/09/22  Hyponatremia -Mild. Na+ Trend: Recent Labs  Lab 07/05/22 2100 07/06/22 0346 07/07/22 0216 07/08/22 0657 07/09/22 0317 07/10/22 0449  07/11/22 0212  NA 134* 136 133* 136 135 133* 134*  -Continue to Monitor and Trend and Repeat CMP in the AM    Diabetes Mellitus Type 2 -Previous Hemoglobin A1c 7.3 (08/06/2021) and Repeat hemoglobin A1c 8.1 (07/06/2022). -Continue to hold home oral hypoglycemic agents as well as long-acting insulin. -Patient has been initiated on sensitive NovoLog/scale insulin before meals  -Continue to Monitor CBG's per Protocol; CBG Trend: Recent Labs  Lab 07/10/22 1123 07/10/22 1609 07/10/22 2004 07/11/22 0006 07/11/22 0411 07/11/22 0820 07/11/22 1132  GLUCAP 154* 136* 168* 134* 135* 149* 163*    Hypertension -BP was on the softer side. -Lisinopril on hold.   -Has IV hydralazine 5 mg every 4 as needed for high blood pressure for systolic blood pressure greater than 180 if necessary -Status post IV albumin x 1. -Continue to Monitor BP per Protocol -Last BP reading was 107/57  Thrombocytosis -Likely Reactive -Platelet Count Trend: Recent Labs  Lab 07/05/22 2100 07/06/22 0346 07/07/22 0216 07/08/22 0657 07/09/22 0317 07/10/22 0449 07/11/22 0212  PLT 304 285 324 321 355 402* 391  -Continue to Monitor and Trend and repeat CBC in the AM  Paraplegia (HCC) -From a trauma few years ago. -Patient is at high risk for sacral decub and osteomyelitis. -Patient will need aggressive skin care for prevention in  the future with an DME and physical therapy and skin protection. -Education of family in Spanish when stable for discharge.  Normocytic Anemia -Hgb/Hct Trend: Recent Labs  Lab 07/05/22 2100 07/06/22 0346 07/07/22 0216 07/08/22 0657 07/09/22 0317 07/10/22 0449 07/11/22 0212  HGB 10.3* 10.0* 10.1* 9.5* 10.1* 10.5* 10.7*  HCT 31.8* 31.0* 30.2* 29.1* 30.6* 31.8* 32.9*  MCV 87.4 88.8 86.8 88.4 86.2 87.8 85.9  -Check Anemia Panel in the AM  -Continue to Monitor for S/Sx of Bleeding; No overt bleeding noted -Repeat CBC in the AM   Hypoalbuminemia -Patient's Albumin Trend: Recent  Labs  Lab 06/15/22 1856 07/05/22 2100 07/06/22 0346 07/08/22 0657 07/11/22 0212  ALBUMIN 3.1* 2.7* 2.6* 2.4* 3.0*  -Continue to Monitor and Trend and repeat CMP in the AM    Morbid Obesity -Complicates overall prognosis and care -Estimated body mass index is 42.58 kg/m as calculated from the following:   Height as of this encounter: 5' 7.99" (1.727 m).   Weight as of this encounter: 127 kg.  -Weight Loss and Dietary Counseling given   DVT prophylaxis:  apixaban (ELIQUIS) tablet 5 mg    Code Status: Full Code Family Communication: No family present at bedside   Disposition Plan:  Level of care: Med-Surg Status is: Inpatient Remains inpatient appropriate because: Needs to have his home health set up and equipment delivered along with wound care teaching prior to discharging and anticipating discharge in the next 24 hours   Consultants:  Infectious Diseases General Surgery  Procedures:  IRRIGATION AND DEBRIDEMENT SACRAL WOUND 11x6x4.5cm   Antimicrobials:  Anti-infectives (From admission, onward)    Start     Dose/Rate Route Frequency Ordered Stop   07/10/22 1100  doxycycline (VIBRA-TABS) tablet 100 mg        100 mg Oral Every 12 hours 07/10/22 1005 08/04/22 0959   07/10/22 1100  amoxicillin-clavulanate (AUGMENTIN) 875-125 MG per tablet 1 tablet        1 tablet Oral Every 12 hours 07/10/22 1005 08/04/22 0959   07/09/22 1400  Ampicillin-Sulbactam (UNASYN) 3 g in sodium chloride 0.9 % 100 mL IVPB  Status:  Discontinued        3 g 200 mL/hr over 30 Minutes Intravenous Every 6 hours 07/09/22 0921 07/10/22 1005   07/07/22 1215  DAPTOmycin (CUBICIN) 750 mg in sodium chloride 0.9 % IVPB  Status:  Discontinued        8 mg/kg  91.8 kg (Adjusted) 130 mL/hr over 30 Minutes Intravenous Daily 07/07/22 1126 07/10/22 1005   07/06/22 1000  piperacillin-tazobactam (ZOSYN) IVPB 3.375 g  Status:  Discontinued        3.375 g 12.5 mL/hr over 240 Minutes Intravenous Every 8 hours 07/06/22  0913 07/09/22 0921   07/06/22 0400  vancomycin (VANCOREADY) IVPB 1250 mg/250 mL  Status:  Discontinued        1,250 mg 166.7 mL/hr over 90 Minutes Intravenous 2 times daily 07/06/22 0305 07/07/22 1112   07/06/22 0100  vancomycin (VANCOCIN) IVPB 1000 mg/200 mL premix        1,000 mg 200 mL/hr over 60 Minutes Intravenous  Once 07/06/22 0050 07/06/22 0402   07/06/22 0100  piperacillin-tazobactam (ZOSYN) IVPB 3.375 g        3.375 g 100 mL/hr over 30 Minutes Intravenous  Once 07/06/22 0050 07/06/22 0212   07/05/22 2300  cefTRIAXone (ROCEPHIN) 1 g in sodium chloride 0.9 % 100 mL IVPB        1 g 200 mL/hr over 30 Minutes  Intravenous  Once 07/05/22 2248 07/06/22 0139       Subjective: Seen and examined at bedside with the assistance of the video translator Clara [phone] and the patient states that he is doing fairly well.  Denies chest pain or shortness of breath.  Denies any pain but thinks the bed is uncomfortable and wants to move out of the bed.  No nausea or vomiting.  No other concerns or complaints at this time.  Objective: Vitals:   07/10/22 2005 07/11/22 0600 07/11/22 0823 07/11/22 1253  BP: 108/70 94/63 (!) 90/58 (!) 107/57  Pulse: 74  85 78  Resp: 17  16 16   Temp: 98 F (36.7 C) 98 F (36.7 C) 98 F (36.7 C) 97.6 F (36.4 C)  TempSrc: Oral Oral Oral   SpO2: 95%  97% 98%  Weight:      Height:        Intake/Output Summary (Last 24 hours) at 07/11/2022 1412 Last data filed at 07/11/2022 4696 Gross per 24 hour  Intake --  Output 1550 ml  Net -1550 ml   Filed Weights   07/05/22 2047 07/06/22 1143  Weight: 127 kg 127 kg   Examination: Physical Exam:  Constitutional: WN/WD obese chronically ill-appearing paraplegic Hispanic male in no acute distress appears calm Respiratory: Diminished to auscultation bilaterally, no wheezing, rales, rhonchi or crackles. Normal respiratory effort and patient is not tachypenic. No accessory muscle use.  Unlabored breathing Cardiovascular:  RRR, no murmurs / rubs / gallops. S1 and S2 auscultated. No extremity edema. Abdomen: Soft, non-tender, distended secondary to body habitus. Bowel sounds positive.  GU: Deferred. Musculoskeletal: Is a paraplegic.  Apparent joint deformities noted except that he does have a fifth ray toe amputation on the left Skin: No rashes, lesions, ulcers. No induration; Warm and dry.  Neurologic: CN 2-12 grossly intact with no focal deficits but he is paraplegic Psychiatric: Normal judgment and insight. Alert and oriented x 3. Normal mood and appropriate affect.   Data Reviewed: I have personally reviewed following labs and imaging studies  CBC: Recent Labs  Lab 07/07/22 0216 07/08/22 0657 07/09/22 0317 07/10/22 0449 07/11/22 0212  WBC 9.5 7.0 7.8 8.5 9.0  NEUTROABS 8.0* 3.7 4.1 4.4 4.8  HGB 10.1* 9.5* 10.1* 10.5* 10.7*  HCT 30.2* 29.1* 30.6* 31.8* 32.9*  MCV 86.8 88.4 86.2 87.8 85.9  PLT 324 321 355 402* 391   Basic Metabolic Panel: Recent Labs  Lab 07/07/22 0216 07/08/22 0657 07/09/22 0317 07/10/22 0449 07/11/22 0212  NA 133* 136 135 133* 134*  K 3.6 3.4* 3.7 3.6 3.6  CL 101 103 102 101 99  CO2 22 24 24 22 24   GLUCOSE 197* 128* 139* 148* 146*  BUN 10 11 8 8 8   CREATININE 0.71 0.66 0.64 0.60* 0.58*  CALCIUM 8.6* 8.1* 8.5* 8.7* 8.9  MG 1.8 1.8 2.0 1.9 1.9  PHOS  --  3.4  --   --  3.5   GFR: Estimated Creatinine Clearance: 137.1 mL/min (A) (by C-G formula based on SCr of 0.58 mg/dL (L)). Liver Function Tests: Recent Labs  Lab 07/05/22 2100 07/06/22 0346 07/08/22 0657 07/11/22 0212  AST 41 43*  --  27  ALT 47* 47*  --  31  ALKPHOS 65 60  --  60  BILITOT 0.8 0.7  --  0.6  PROT 7.6 7.2  --  7.5  ALBUMIN 2.7* 2.6* 2.4* 3.0*   No results for input(s): "LIPASE", "AMYLASE" in the last 168 hours. No results for  input(s): "AMMONIA" in the last 168 hours. Coagulation Profile: Recent Labs  Lab 07/06/22 0146  INR 1.6*   Cardiac Enzymes: Recent Labs  Lab 07/06/22 0124  07/08/22 0657  CKTOTAL 108 57   BNP (last 3 results) No results for input(s): "PROBNP" in the last 8760 hours. HbA1C: No results for input(s): "HGBA1C" in the last 72 hours. CBG: Recent Labs  Lab 07/10/22 2004 07/11/22 0006 07/11/22 0411 07/11/22 0820 07/11/22 1132  GLUCAP 168* 134* 135* 149* 163*   Lipid Profile: No results for input(s): "CHOL", "HDL", "LDLCALC", "TRIG", "CHOLHDL", "LDLDIRECT" in the last 72 hours. Thyroid Function Tests: No results for input(s): "TSH", "T4TOTAL", "FREET4", "T3FREE", "THYROIDAB" in the last 72 hours. Anemia Panel: No results for input(s): "VITAMINB12", "FOLATE", "FERRITIN", "TIBC", "IRON", "RETICCTPCT" in the last 72 hours. Sepsis Labs: Recent Labs  Lab 07/05/22 2100  LATICACIDVEN 1.1    Recent Results (from the past 240 hour(s))  Urine Culture     Status: Abnormal   Collection Time: 07/05/22 10:05 PM   Specimen: Urine, Catheterized  Result Value Ref Range Status   Specimen Description URINE, CATHETERIZED  Final   Special Requests   Final    NONE Performed at [facility], [address], [place], [place] [address]    Culture (A)  Final    >=100,000 COLONIES/mL ESCHERICHIA COLI >=100,000 COLONIES/mL PROTEUS MIRABILIS Confirmed Extended Spectrum Beta-Lactamase Producer (ESBL).  In bloodstream infections from ESBL organisms, carbapenems are preferred over piperacillin/tazobactam. They are shown to have a lower risk of mortality.    Report Status 07/08/2022 FINAL  Final   Organism ID, Bacteria ESCHERICHIA COLI (A)  Final   Organism ID, Bacteria PROTEUS MIRABILIS (A)  Final      Susceptibility   Escherichia coli - MIC*    AMPICILLIN >=32 RESISTANT Resistant     CEFAZOLIN >=64 RESISTANT Resistant     CEFEPIME 16 RESISTANT Resistant     CEFTRIAXONE >=64 RESISTANT Resistant     CIPROFLOXACIN >=4 RESISTANT Resistant     GENTAMICIN <=1 SENSITIVE Sensitive     IMIPENEM <=0.25 SENSITIVE Sensitive     NITROFURANTOIN 128 RESISTANT  Resistant     TRIMETH/SULFA <=20 SENSITIVE Sensitive     AMPICILLIN/SULBACTAM 8 SENSITIVE Sensitive     PIP/TAZO <=4 SENSITIVE Sensitive     * >=100,000 COLONIES/mL ESCHERICHIA COLI   Proteus mirabilis - MIC*    AMPICILLIN <=2 SENSITIVE Sensitive     CEFAZOLIN <=4 SENSITIVE Sensitive     CEFEPIME <=0.12 SENSITIVE Sensitive     CEFTRIAXONE <=0.25 SENSITIVE Sensitive     CIPROFLOXACIN <=0.25 SENSITIVE Sensitive     GENTAMICIN <=1 SENSITIVE Sensitive     IMIPENEM 4 SENSITIVE Sensitive     NITROFURANTOIN RESISTANT Resistant     TRIMETH/SULFA >=320 RESISTANT Resistant     AMPICILLIN/SULBACTAM <=2 SENSITIVE Sensitive     PIP/TAZO <=4 SENSITIVE Sensitive     * >=100,000 COLONIES/mL PROTEUS MIRABILIS  Aerobic/Anaerobic Culture w Gram Stain (surgical/deep wound)     Status: None   Collection Time: 07/06/22 12:53 PM   Specimen: Path fluid; Body Fluid  Result Value Ref Range Status   Specimen Description WOUND  Final   Special Requests SACRAL  Final   Gram Stain   Final    RARE WBC PRESENT, PREDOMINANTLY MONONUCLEAR FEW GRAM POSITIVE COCCI IN PAIRS FEW GRAM NEGATIVE RODS    Culture   Final    FEW STREPTOCOCCUS CONSTELLATUS Beta hemolytic streptococci are predictably susceptible to penicillin and other beta lactams.  Susceptibility testing not routinely performed. RARE STAPHYLOCOCCUS EPIDERMIDIS FEW BACTEROIDES FRAGILIS BETA LACTAMASE POSITIVE Performed at [facility], [address] [place], [place] [address]    Report Status 07/10/2022 FINAL  Final   Organism ID, Bacteria STAPHYLOCOCCUS EPIDERMIDIS  Final      Susceptibility   Staphylococcus epidermidis - MIC*    CIPROFLOXACIN <=0.5 SENSITIVE Sensitive     ERYTHROMYCIN >=8 RESISTANT Resistant     GENTAMICIN <=0.5 SENSITIVE Sensitive     OXACILLIN >=4 RESISTANT Resistant     TETRACYCLINE <=1 SENSITIVE Sensitive     VANCOMYCIN 2 SENSITIVE Sensitive     TRIMETH/SULFA 160 RESISTANT Resistant     CLINDAMYCIN >=8 RESISTANT  Resistant     RIFAMPIN <=0.5 SENSITIVE Sensitive     Inducible Clindamycin NEGATIVE Sensitive     * RARE STAPHYLOCOCCUS EPIDERMIDIS    Radiology Studies: No results found.  Scheduled Meds:  amoxicillin-clavulanate  1 tablet Oral Q12H   apixaban  5 mg Oral BID   bisacodyl  10 mg Rectal Daily   Chlorhexidine Gluconate Cloth  6 each Topical Daily   docusate sodium  100 mg Oral BID   doxycycline  100 mg Oral Q12H   FLUoxetine  20 mg Oral Daily   gabapentin  600 mg Oral TID   insulin aspart  0-9 Units Subcutaneous TID WC   Continuous Infusions:   LOS: 5 days   Marguerita Merles, DO [facility] Available via Epic secure chat 7am-7pm After these hours, please refer to coverage provider listed on [email or web address] 07/11/2022, 2:12 PM

## 2022-07-12 ENCOUNTER — Other Ambulatory Visit (HOSPITAL_COMMUNITY): Payer: Self-pay

## 2022-07-12 DIAGNOSIS — N3001 Acute cystitis with hematuria: Secondary | ICD-10-CM | POA: Diagnosis not present

## 2022-07-12 DIAGNOSIS — L03317 Cellulitis of buttock: Secondary | ICD-10-CM | POA: Diagnosis not present

## 2022-07-12 DIAGNOSIS — Z86718 Personal history of other venous thrombosis and embolism: Secondary | ICD-10-CM | POA: Diagnosis not present

## 2022-07-12 DIAGNOSIS — G8220 Paraplegia, unspecified: Secondary | ICD-10-CM | POA: Diagnosis not present

## 2022-07-12 DIAGNOSIS — L089 Local infection of the skin and subcutaneous tissue, unspecified: Secondary | ICD-10-CM

## 2022-07-12 DIAGNOSIS — L98499 Non-pressure chronic ulcer of skin of other sites with unspecified severity: Secondary | ICD-10-CM

## 2022-07-12 LAB — VITAMIN B12: Vitamin B-12: 554 pg/mL (ref 180–914)

## 2022-07-12 LAB — CBC WITH DIFFERENTIAL/PLATELET
Abs Immature Granulocytes: 0.29 10*3/uL — ABNORMAL HIGH (ref 0.00–0.07)
Basophils Absolute: 0.1 10*3/uL (ref 0.0–0.1)
Basophils Relative: 1 %
Eosinophils Absolute: 0.4 10*3/uL (ref 0.0–0.5)
Eosinophils Relative: 4 %
HCT: 32.7 % — ABNORMAL LOW (ref 39.0–52.0)
Hemoglobin: 10.6 g/dL — ABNORMAL LOW (ref 13.0–17.0)
Immature Granulocytes: 3 %
Lymphocytes Relative: 26 %
Lymphs Abs: 2.8 10*3/uL (ref 0.7–4.0)
MCH: 28.2 pg (ref 26.0–34.0)
MCHC: 32.4 g/dL (ref 30.0–36.0)
MCV: 87 fL (ref 80.0–100.0)
Monocytes Absolute: 0.6 10*3/uL (ref 0.1–1.0)
Monocytes Relative: 6 %
Neutro Abs: 6.5 10*3/uL (ref 1.7–7.7)
Neutrophils Relative %: 60 %
Platelets: 394 10*3/uL (ref 150–400)
RBC: 3.76 MIL/uL — ABNORMAL LOW (ref 4.22–5.81)
RDW: 14.1 % (ref 11.5–15.5)
WBC: 10.7 10*3/uL — ABNORMAL HIGH (ref 4.0–10.5)
nRBC: 0 % (ref 0.0–0.2)

## 2022-07-12 LAB — MAGNESIUM: Magnesium: 1.9 mg/dL (ref 1.7–2.4)

## 2022-07-12 LAB — IRON AND TIBC
Iron: 56 ug/dL (ref 45–182)
Saturation Ratios: 22 % (ref 17.9–39.5)
TIBC: 252 ug/dL (ref 250–450)
UIBC: 196 ug/dL

## 2022-07-12 LAB — GLUCOSE, CAPILLARY
Glucose-Capillary: 156 mg/dL — ABNORMAL HIGH (ref 70–99)
Glucose-Capillary: 175 mg/dL — ABNORMAL HIGH (ref 70–99)
Glucose-Capillary: 214 mg/dL — ABNORMAL HIGH (ref 70–99)

## 2022-07-12 LAB — FERRITIN: Ferritin: 340 ng/mL — ABNORMAL HIGH (ref 24–336)

## 2022-07-12 LAB — COMPREHENSIVE METABOLIC PANEL
ALT: 27 U/L (ref 0–44)
AST: 22 U/L (ref 15–41)
Albumin: 2.9 g/dL — ABNORMAL LOW (ref 3.5–5.0)
Alkaline Phosphatase: 56 U/L (ref 38–126)
Anion gap: 9 (ref 5–15)
BUN: 12 mg/dL (ref 6–20)
CO2: 23 mmol/L (ref 22–32)
Calcium: 8.8 mg/dL — ABNORMAL LOW (ref 8.9–10.3)
Chloride: 100 mmol/L (ref 98–111)
Creatinine, Ser: 0.65 mg/dL (ref 0.61–1.24)
GFR, Estimated: >60 mL/min (ref >60)
Glucose, Bld: 162 mg/dL — ABNORMAL HIGH (ref 70–99)
Potassium: 3.6 mmol/L (ref 3.5–5.1)
Sodium: 132 mmol/L — ABNORMAL LOW (ref 135–145)
Total Bilirubin: 0.4 mg/dL (ref 0.3–1.2)
Total Protein: 7.5 g/dL (ref 6.5–8.1)

## 2022-07-12 LAB — RETICULOCYTES
Immature Retic Fract: 30.2 % — ABNORMAL HIGH (ref 2.3–15.9)
RBC.: 3.65 MIL/uL — ABNORMAL LOW (ref 4.22–5.81)
Retic Count, Absolute: 159.5 10*3/uL (ref 19.0–186.0)
Retic Ct Pct: 4.4 % — ABNORMAL HIGH (ref 0.4–3.1)

## 2022-07-12 LAB — PHOSPHORUS: Phosphorus: 3.2 mg/dL (ref 2.5–4.6)

## 2022-07-12 LAB — FOLATE: Folate: 11.1 ng/mL (ref >5.9)

## 2022-07-12 MED ORDER — AMOXICILLIN-POT CLAVULANATE 875-125 MG PO TABS
1.0000 | ORAL_TABLET | Freq: Two times a day (BID) | ORAL | 0 refills | Status: DC
  Filled 2022-07-12: qty 46, 23d supply, fill #0

## 2022-07-12 MED ORDER — ONDANSETRON HCL 4 MG PO TABS
4.0000 mg | ORAL_TABLET | Freq: Four times a day (QID) | ORAL | 0 refills | Status: AC | PRN
  Filled 2022-07-12: qty 20, 5d supply, fill #0

## 2022-07-12 MED ORDER — DOXYCYCLINE HYCLATE 100 MG PO TABS
100.0000 mg | ORAL_TABLET | Freq: Two times a day (BID) | ORAL | 0 refills | Status: DC
  Filled 2022-07-12: qty 46, 23d supply, fill #0

## 2022-07-12 MED ORDER — LEVALBUTEROL HCL 0.63 MG/3ML IN NEBU
0.6300 mg | INHALATION_SOLUTION | Freq: Four times a day (QID) | RESPIRATORY_TRACT | Status: DC
  Filled 2022-07-12 (×2): qty 3

## 2022-07-12 MED ORDER — LEVALBUTEROL HCL 0.63 MG/3ML IN NEBU
0.6300 mg | INHALATION_SOLUTION | Freq: Four times a day (QID) | RESPIRATORY_TRACT | Status: DC | PRN
  Administered 2022-07-12: 0.63 mg via RESPIRATORY_TRACT
  Filled 2022-07-12: qty 3

## 2022-07-12 MED ORDER — DOCUSATE SODIUM 100 MG PO CAPS
100.0000 mg | ORAL_CAPSULE | Freq: Two times a day (BID) | ORAL | 0 refills | Status: AC
  Filled 2022-07-12: qty 100, 50d supply, fill #0

## 2022-07-12 NOTE — Discharge Summary (Signed)
Physician Discharge Summary   Patient: [name] MRN: [id number] DOB: May 29, 1965  Admit date:     07/05/2022  Discharge date: 07/12/2022  Discharge Physician: Marguerita Merles, DO   PCP: System, Provider Not In   Recommendations at discharge:  {Tip this will not be part of the note when signed- Example include specific recommendations for outpatient follow-up, pending tests to follow-up on. (Optional):26781}  ***  Discharge Diagnoses: Principal Problem:   Cellulitis, gluteal, left Active Problems:   Quadriplegia (HCC)   Presence of IVC filter   Sepsis due to cellulitis (HCC)   DM2 (diabetes mellitus, type 2) (HCC)   History of DVT (deep vein thrombosis)   Acute cystitis with hematuria   Hypertension   Paraplegia (HCC)  Resolved Problems:   * No resolved hospital problems. 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  Workup was concerning for infected decubitus ulcer.  CT scan of the abdomen pelvis was done and showed a left medial gluteal cleft tissue emphysema with overlying dermal thickening and fat stranding concerning for infection.  He has a known obstructive left-sided nephrolithiasis with colonic diverticulosis.  General surgery was consulted as well as infectious diseases and patient underwent irrigation and debridement of the sacral wound with necrotic purulent tissue removed and cultures being sent.  He was placed on empiric antibiotics with IV vancomycin and Zosyn and ID was consulted for further adjustments for his antibiotics.  His antibiotics have now been de-escalated to oral antibiotics and surgery is sign of the case.  TOC assisting with discharge disposition and  will need to figure out if the amount of wound care that he requires can be done at home or if he will need to go to SNF.  He is now being set up for home health PT OT and RN and nursing educating wife and dressing changes as well as ordering a home health nurse.  Ordering a low loss air mattress for the patient and home health to evaluate see the patient Monday.  Anticipating discharge in the next 24 hours  Assessment and Plan:  Infected Sacral Decubitus Ulcer -Status post excisional debridement of sacral wound per Dr. Violeta Gelinas 07/06/2022 with necrotic purulent tissue removed. -Preliminary cultures predominantly mononuclear with few gram-positive cocci in pairs, few gram-negative rods and now Growth as below -Patient was on IV vancomycin, IV Zosyn on admission. -ID consulted patient seen in consultation by Dr. Daiva Eves and IV vancomycin discontinued and changed to IV daptomycin and recommending continuation of Zosyn which was de-escalated to IV Unasyn yesterday  and now changed to oral Abx with Doxycycline and Augment -Per ID patient with no exposed bone however recommending a protracted oral antibiotics if organisms can be pinned down and are sensitive to oral antibiotics. -General surgery reassessed patient, feel wound is clean, no active infection or significant left necrotic tissue at this time and recommended local wound care.  No plans for any further procedure per general surgery. -General surgery has placed home health RN orders and recommended referral to wound care center on discharge.  General surgery signed off as of 07/09/2022. -ID now signed off the case -Will see if he can get the extensive Wound care Required at home or if  will need to go to SNF; [facility] assisting with Discharge Disposition to see if extensive Wound care can be done or if he will need SNF.  They are now setting him up for home health with PT OT and RN and ordering him a low loss air mattress as well as attempting to get  him a new Hoyer lift.  Anticipate discharging patient is on 07/12/2022 once equipment is delivered -Will obtain WOC nurse evaluation however there are orders placed by surgery in place and the WOC does not feel there is a role for her at this time -Continue with pain control with acetaminophen 650 mg p.o. see every 6h as needed mild pain or fever, oxycodone 5 mg p.o. every 8 as needed for moderate and severe pain, IV morphine 2 mg every 2 as needed for severe and moderate pain and supportive care with ondansetron 4 mg p.o./IV every 6 as needed -Continue with bowel regimen with bisacodyl 10 mg rectally daily as well as docusate 100 mg p.o. every 12h    UTI versus bacteriuria/colonization -Patient with chronic indwelling Foley catheter with paraplegia. -Urinalysis done with large leukocytes, nitrite negative, many bacteria, WBC > 50.  -Urine cultures with > 100,000 colonies of ESBL E. coli and Proteus Mirabilis. -Patient seen in consultation by ID who feel could likely be a colonization in the patient with chronic indwelling Foley catheter and feel patient source of infection more from the sacral decubitus ulcer. -Patient empirically on IV antibiotics below and now being transitioned to oral Abx -ID following and change antibiotics as below   History of DVT -Patient being followed by general surgery and no plans for any further procedures so Full Dose Lovenox was discontinued and resumed home regimen Eliquis.     Sepsis secondary to sacral decubitus ulcer s/p I and D POD #4 -Patient on admission met criteria for sepsis due to tachycardia tachypnea and source of infection. -Status post I&D with preliminary surgical cultures few Streptococcus constellatus, Staphylococcus epidermis, and Bacteroides -Was on on IV daptomycin, IV Zosyn, patient being followed by ID and IV Zosyn has been narrowed to IV Unasyn today today now ID is recommending 4 weeks of oral antibiotics of Augmentin and  Doxycycline -Appreciate ID input and recommendations.   -Neurosurgery evaluated and feel that the wound is clean with no active infection and no significant necrotic tissue and they recommended continuing local wound care and have signed off the case -ID recommends optimizing site offloading and nutrition -ID recommends following up in ID clinic on 08/02/2022 at 10:30 AM   History of IVC filter -Patient high risk for DVT and PE. -Was on Eliquis which was held in anticipation of procedure but has now been resumed on the evening of 07/09/22  Hyponatremia -Mild. Na+ Trend: Recent Labs  Lab 07/06/22 0346 07/07/22 0216 07/08/22 0657 07/09/22 0317 07/10/22 0449 07/11/22 0212 07/12/22 0323  NA 136 133* 136 135 133* 134* 132*  -Continue to Monitor and Trend and Repeat CMP in the AM    Diabetes Mellitus Type 2 -Previous Hemoglobin A1c 7.3 (08/06/2021) and Repeat hemoglobin A1c 8.1 (07/06/2022). -Continue to hold home oral hypoglycemic agents as well as long-acting insulin. -Patient has been initiated on sensitive NovoLog/scale insulin before meals  -Continue to Monitor CBG's per Protocol; CBG Trend: Recent Labs  Lab 07/11/22 1132 07/11/22 1606 07/11/22 2004 07/11/22 2355 07/12/22 0412 07/12/22 0811 07/12/22 1122  GLUCAP 163* 145* 165* 144* 156* 175* 214*    Hypertension -BP was on the softer side. -  Lisinopril on hold.   -Has IV hydralazine 5 mg every 4 as needed for high blood pressure for systolic blood pressure greater than 180 if necessary -Status post IV albumin x 1. -Continue to Monitor BP per Protocol -Last BP reading was 107/57  Thrombocytosis -Likely Reactive -Platelet Count Trend: Recent Labs  Lab 07/06/22 0346 07/07/22 0216 07/08/22 0657 07/09/22 0317 07/10/22 0449 07/11/22 0212 07/12/22 0323  PLT 285 324 321 355 402* 391 394  -Continue to Monitor and Trend and repeat CBC in the AM  Paraplegia (HCC) -From a trauma few years ago. -Patient is at high risk  for sacral decub and osteomyelitis. -Patient will need aggressive skin care for prevention in the future with an DME and physical therapy and skin protection. -Education of family in Spanish when stable for discharge.  Normocytic Anemia -Hgb/Hct Trend: Recent Labs  Lab 07/06/22 0346 07/07/22 0216 07/08/22 0657 07/09/22 0317 07/10/22 0449 07/11/22 0212 07/12/22 0323  HGB 10.0* 10.1* 9.5* 10.1* 10.5* 10.7* 10.6*  HCT 31.0* 30.2* 29.1* 30.6* 31.8* 32.9* 32.7*  MCV 88.8 86.8 88.4 86.2 87.8 85.9 87.0  -Check Anemia Panel in the AM  -Continue to Monitor for S/Sx of Bleeding; No overt bleeding noted -Repeat CBC in the AM   Hypoalbuminemia -Patient's Albumin Trend: Recent Labs  Lab 06/15/22 1856 07/05/22 2100 07/06/22 0346 07/08/22 0657 07/11/22 0212 07/12/22 0323  ALBUMIN 3.1* 2.7* 2.6* 2.4* 3.0* 2.9*  -Continue to Monitor and Trend and repeat CMP in the AM    Morbid Obesity -Complicates overall prognosis and care -Estimated body mass index is 42.58 kg/m as calculated from the following:   Height as of this encounter: 5' 7.99" (1.727 m).   Weight as of this encounter: 127 kg.  -Weight Loss and Dietary Counseling given      {Tip this will not be part of the note when signed Body mass index is 42.58 kg/m. , ,  Active Pressure Injury/Wound(s)     Pressure Ulcer  Duration          Pressure Injury 04/27/20 Heel Right Deep Tissue Pressure Injury - Purple or maroon localized area of discolored intact skin or blood-filled blister due to damage of underlying soft tissue from pressure and/or shear. inner heel 806 days   Pressure Injury 04/27/20 Heel Right Stage 2 -  Partial thickness loss of dermis presenting as a shallow open injury with a red, pink wound bed without slough. 806 days           (Optional):26781}  {(NOTE) Pain control PDMP Statment (Optional):26782} Consultants: *** Procedures performed: ***  Disposition: {Plan; Disposition:26390} Diet recommendation:   Discharge Diet Orders (From admission, onward)     Start     Ordered   07/12/22 0000  Diet - low sodium heart healthy        07/12/22 1416   07/12/22 0000  Diet Carb Modified        07/12/22 1416           {Diet_Plan:26776} DISCHARGE MEDICATION: Allergies as of 07/12/2022   No Known Allergies      Medication List     TAKE these medications    acetaminophen 500 MG tablet Commonly known as: TYLENOL Take 500 mg by mouth every 6 (six) hours as needed for mild pain.   amoxicillin-clavulanate 875-125 MG tablet Commonly known as: AUGMENTIN Take 1 tablet by mouth every 12 (twelve) hours for 23 days.   ARTIFICIAL TEARS OP Apply 1-2 drops to eye daily as needed (dry eyes).  baclofen 10 MG tablet Commonly known as: LIORESAL TAKE 1 TABLET BY MOUTH 3 TIMES DAILY AS NEEDED FOR MUSCLE SPASMS OR tightness What changed:  how much to take how to take this when to take this reasons to take this additional instructions   BISACODYL LAXATIVE RE Place 1 suppository rectally at bedtime.   blood glucose meter kit and supplies Dispense based on patient and insurance preference. Use up to four times daily as directed. (FOR ICD-10 E10.9, E11.9).   docusate sodium 100 MG capsule Commonly known as: COLACE Take 1 capsule (100 mg total) by mouth 2 (two) times daily for 5 days, then as directed by physician   doxycycline 100 MG tablet Commonly known as: VIBRA-TABS Take 1 tablet (100 mg total) by mouth every 12 (twelve) hours for 23 days.   Eliquis 5 MG Tabs tablet Generic drug: apixaban Take 1 tablet by mouth 2 (two) times daily.   FLUoxetine 20 MG capsule Commonly known as: PROZAC Take 1 capsule (20 mg total) by mouth daily. What changed: when to take this   FREESTYLE TEST STRIPS test strip Generic drug: glucose blood 1 Each daily.   furosemide 40 MG tablet Commonly known as: LASIX Take 1 tablet daily for 6 days, take as needed after that for swelling or weight gain    gabapentin 600 MG tablet Commonly known as: NEURONTIN TAKE 1 TABLET BY MOUTH 3 TIMES DAILY   glipiZIDE 10 MG tablet Commonly known as: GLUCOTROL Take 1 tablet by mouth 2 (two) times daily.   HYDROcodone-acetaminophen 5-325 MG tablet Commonly known as: Norco Take 1 tablet by mouth 2 (two) times daily as needed for moderate pain.   Januvia 100 MG tablet Generic drug: sitaGLIPtin Take 100 mg by mouth daily.   Lancets Misc daily.   Lantus 100 UNIT/ML injection Generic drug: insulin glargine Inject 30 Units into the skin at bedtime.   lisinopril 10 MG tablet Commonly known as: ZESTRIL Take 10 mg by mouth daily.   Magellan Insulin Safety Syr 30G X 5/16" 1 ML Misc Generic drug: Insulin Syringe-Needle U-100 1 Each by miscellaneous route nightly at bedtime Use with injection of insulin one time at night   ondansetron 4 MG tablet Commonly known as: ZOFRAN Take 1 tablet (4 mg total) by mouth every 6 (six) hours as needed for nausea.   Precision Xtra Devi by Does not apply route.   senna 8.6 MG Tabs tablet Commonly known as: SENOKOT Take 1 tablet by mouth daily.   sildenafil 100 MG tablet Commonly known as: Viagra Take 1 tablet (100 mg total) by mouth daily as needed for erectile dysfunction.               Durable Medical Equipment  (From admission, onward)           Start     Ordered   07/11/22 1347  For home use only DME Hospital bed  Once       Question Answer Comment  Length of Need Lifetime   Patient has (list medical condition): quadriplegia, pressure ulcer   Bed type Semi-electric   Support Surface: Low Air loss Mattress      07/11/22 1347   07/09/22 1847  For home use only DME Air overlay mattress  Once        07/09/22 1846              Discharge Care Instructions  (From admission, onward)           Start  Ordered   07/12/22 0000  Discharge wound care:       Comments: Per General Surgery Recommendations   07/12/22 1416             Follow-up Information     Maczis, Hedda Slade, PA-C. Schedule an appointment as soon as possible for a visit in 1 month(s).   Specialty: General Surgery Why: For wound re-check Contact information: [address] [employer] [address] [facility] [place] [address] [phone]         Veryl Speak, FNP Follow up.   Specialty: Infectious Diseases Why: 08/02/22 at 10:30 am. Please call to reschedule if you are not able to make this appointment. Contact information: [address] [employer] [address] [place] [address] [phone]         [facility]              Follow up on 07/25/2022.   Why: Wound care appointment scheduled for 07/25/2022 at 8am Contact information: [address] [place] [address] [phone]        Care, [facility] Follow up.   Specialty: Home Health Services Why: [facility] will provide Home Health Nursing,PT and OT and will call within 48 hours of discharge to schedule home visit Contact information: [address] [place] [address] [phone]                Discharge Exam: Ceasar Mons Weights   07/05/22 2047 07/06/22 1143  Weight: 127 kg 127 kg   ***  Condition at discharge: {DC Condition:26389}  The results of significant diagnostics from this hospitalization (including imaging, microbiology, ancillary and laboratory) are listed below for reference.   Imaging Studies: CT ABDOMEN PELVIS W CONTRAST  Result Date: 07/06/2022 CLINICAL DATA:  Abdominal pain, acute, nonlocalized buttucks cellulitis and concern for abscess EXAM: CT ABDOMEN AND PELVIS WITH CONTRAST TECHNIQUE: Multidetector CT imaging of the abdomen and pelvis was performed using the standard protocol following bolus administration of intravenous contrast. RADIATION DOSE REDUCTION: This exam was performed according to the departmental dose-optimization program which  includes automated exposure control, adjustment of the mA and/or kV according to patient size and/or use of iterative reconstruction technique. CONTRAST:  OMNIPAQUE IOHEXOL 300 MG/ML  SOLN COMPARISON:  None Available. FINDINGS: Lower chest: No acute abnormality. Hepatobiliary: No focal liver abnormality. No gallstones, gallbladder wall thickening, or pericholecystic fluid. No biliary dilatation. Pancreas: No focal lesion. Normal pancreatic contour. No surrounding inflammatory changes. No main pancreatic ductal dilatation. Spleen: Normal in size without focal abnormality.  Splenule noted. Adrenals/Urinary Tract: No adrenal nodule bilaterally. Bilateral kidneys enhance symmetrically. Left nephrolithiasis measuring up to 2.9 cm. Associated mild fullness of the left collecting system with peripelvic fat stranding. No frank hydroureteronephrosis. Mild left urothelial thickening. No left ureterolithiasis. No right nephroureterolithiasis. No right hydronephrosis. The urinary bladder is decompressed with Foley catheter tip and balloon terminating within the lumen. On delayed imaging, there is no urothelial wall thickening and there are no filling defects in the opacified portions of the bilateral collecting systems or ureters. Stomach/Bowel: Stomach is within normal limits. No evidence of bowel wall thickening or dilatation. Few scattered colonic diverticula. Appendix appears normal. Vascular/Lymphatic: Inferior vena cava filter in grossly appropriate position. No abdominal aorta or iliac aneurysm. Mild atherosclerotic plaque of the aorta and its branches. Borderline enlarged bilateral pelvis sidewall lymph nodes. Otherwise no abdominal, pelvic, or inguinal lymphadenopathy. Reproductive: Prostate is unremarkable. Other: No intraperitoneal  free fluid. No intraperitoneal free gas. No organized fluid collection. Musculoskeletal: Left medial gluteal cleft subcutaneus soft tissue emphysema. Associated overlying mild dermal  thickening and subcutaneus soft tissue fat stranding. No organized fluid collection. Atrophic musculature. No suspicious lytic or blastic osseous lesions. No acute displaced fracture. Multilevel degenerative changes of the spine. IMPRESSION: 1. Left medial gluteal cleft subcutaneus soft tissue emphysema. Associated overlying mild dermal thickening and subcutaneus soft tissue fat stranding. No organized abscess identified. Findings suggestive of infection. A necrotizing fasciitis cannot be excluded as this is a clinical diagnosis. 2. Nonobstructive left nephrolithiasis measuring up to 2.9 cm with question superimposed left collecting system infection. Correlate with urinalysis. 3. Colonic diverticulosis with no acute diverticulitis. 4. Inferior vena cava filter in appropriate position. 5. Aortic Atherosclerosis (ICD10-I70.0). 6. Borderline enlarged bilateral pelvis sidewall lymph nodes. Recommend attention on follow-up. Electronically Signed   By: Tish Frederickson M.D.   On: 07/06/2022 00:24   DG Chest 2 View  Result Date: 07/05/2022 CLINICAL DATA:  Fever and chills x1 week. EXAM: CHEST - 2 VIEW COMPARISON:  January 04, 2022 FINDINGS: The heart size and mediastinal contours are within normal limits. Both lungs are clear. Stable postoperative changes are seen within the lower cervical and mid to upper thoracic spine. The visualized skeletal structures are unremarkable. IMPRESSION: No active cardiopulmonary disease. Electronically Signed   By: Aram Candela M.D.   On: 07/05/2022 22:31    Microbiology: Results for orders placed or performed during the hospital encounter of 07/05/22  Urine Culture     Status: Abnormal   Collection Time: 07/05/22 10:05 PM   Specimen: Urine, Catheterized  Result Value Ref Range Status   Specimen Description URINE, CATHETERIZED  Final   Special Requests   Final    NONE Performed at [facility], [address] [place], [place] [address]    Culture (A)  Final     >=100,000 COLONIES/mL ESCHERICHIA COLI >=100,000 COLONIES/mL PROTEUS MIRABILIS Confirmed Extended Spectrum Beta-Lactamase Producer (ESBL).  In bloodstream infections from ESBL organisms, carbapenems are preferred over piperacillin/tazobactam. They are shown to have a lower risk of mortality.    Report Status 07/08/2022 FINAL  Final   Organism ID, Bacteria ESCHERICHIA COLI (A)  Final   Organism ID, Bacteria PROTEUS MIRABILIS (A)  Final      Susceptibility   Escherichia coli - MIC*    AMPICILLIN >=32 RESISTANT Resistant     CEFAZOLIN >=64 RESISTANT Resistant     CEFEPIME 16 RESISTANT Resistant     CEFTRIAXONE >=64 RESISTANT Resistant     CIPROFLOXACIN >=4 RESISTANT Resistant     GENTAMICIN <=1 SENSITIVE Sensitive     IMIPENEM <=0.25 SENSITIVE Sensitive     NITROFURANTOIN 128 RESISTANT Resistant     TRIMETH/SULFA <=20 SENSITIVE Sensitive     AMPICILLIN/SULBACTAM 8 SENSITIVE Sensitive     PIP/TAZO <=4 SENSITIVE Sensitive     * >=100,000 COLONIES/mL ESCHERICHIA COLI   Proteus mirabilis - MIC*    AMPICILLIN <=2 SENSITIVE Sensitive     CEFAZOLIN <=4 SENSITIVE Sensitive     CEFEPIME <=0.12 SENSITIVE Sensitive     CEFTRIAXONE <=0.25 SENSITIVE Sensitive     CIPROFLOXACIN <=0.25 SENSITIVE Sensitive     GENTAMICIN <=1 SENSITIVE Sensitive     IMIPENEM 4 SENSITIVE Sensitive     NITROFURANTOIN RESISTANT Resistant     TRIMETH/SULFA >=320 RESISTANT Resistant     AMPICILLIN/SULBACTAM <=2 SENSITIVE Sensitive     PIP/TAZO <=4 SENSITIVE Sensitive     * >=100,000 COLONIES/mL  PROTEUS MIRABILIS  Aerobic/Anaerobic Culture w Gram Stain (surgical/deep wound)     Status: None   Collection Time: 07/06/22 12:53 PM   Specimen: Path fluid; Body Fluid  Result Value Ref Range Status   Specimen Description WOUND  Final   Special Requests SACRAL  Final   Gram Stain   Final    RARE WBC PRESENT, PREDOMINANTLY MONONUCLEAR FEW GRAM POSITIVE COCCI IN PAIRS FEW GRAM NEGATIVE RODS    Culture   Final    FEW  STREPTOCOCCUS CONSTELLATUS Beta hemolytic streptococci are predictably susceptible to penicillin and other beta lactams. Susceptibility testing not routinely performed. RARE STAPHYLOCOCCUS EPIDERMIDIS FEW BACTEROIDES FRAGILIS BETA LACTAMASE POSITIVE Performed at [facility], [address], [place], [place] [address]    Report Status 07/10/2022 FINAL  Final   Organism ID, Bacteria STAPHYLOCOCCUS EPIDERMIDIS  Final      Susceptibility   Staphylococcus epidermidis - MIC*    CIPROFLOXACIN <=0.5 SENSITIVE Sensitive     ERYTHROMYCIN >=8 RESISTANT Resistant     GENTAMICIN <=0.5 SENSITIVE Sensitive     OXACILLIN >=4 RESISTANT Resistant     TETRACYCLINE <=1 SENSITIVE Sensitive     VANCOMYCIN 2 SENSITIVE Sensitive     TRIMETH/SULFA 160 RESISTANT Resistant     CLINDAMYCIN >=8 RESISTANT Resistant     RIFAMPIN <=0.5 SENSITIVE Sensitive     Inducible Clindamycin NEGATIVE Sensitive     * RARE STAPHYLOCOCCUS EPIDERMIDIS    Labs: CBC: Recent Labs  Lab 07/08/22 0657 07/09/22 0317 07/10/22 0449 07/11/22 0212 07/12/22 0323  WBC 7.0 7.8 8.5 9.0 10.7*  NEUTROABS 3.7 4.1 4.4 4.8 6.5  HGB 9.5* 10.1* 10.5* 10.7* 10.6*  HCT 29.1* 30.6* 31.8* 32.9* 32.7*  MCV 88.4 86.2 87.8 85.9 87.0  PLT 321 355 402* 391 394   Basic Metabolic Panel: Recent Labs  Lab 07/08/22 0657 07/09/22 0317 07/10/22 0449 07/11/22 0212 07/12/22 0323  NA 136 135 133* 134* 132*  K 3.4* 3.7 3.6 3.6 3.6  CL 103 102 101 99 100  CO2 24 24 22 24 23   GLUCOSE 128* 139* 148* 146* 162*  BUN 11 8 8 8 12   CREATININE 0.66 0.64 0.60* 0.58* 0.65  CALCIUM 8.1* 8.5* 8.7* 8.9 8.8*  MG 1.8 2.0 1.9 1.9 1.9  PHOS 3.4  --   --  3.5 3.2   Liver Function Tests: Recent Labs  Lab 07/05/22 2100 07/06/22 0346 07/08/22 0657 07/11/22 0212 07/12/22 0323  AST 41 43*  --  27 22  ALT 47* 47*  --  31 27  ALKPHOS 65 60  --  60 56  BILITOT 0.8 0.7  --  0.6 0.4  PROT 7.6 7.2  --  7.5 7.5  ALBUMIN 2.7* 2.6* 2.4* 3.0* 2.9*    CBG: Recent Labs  Lab 07/11/22 2004 07/11/22 2355 07/12/22 0412 07/12/22 0811 07/12/22 1122  GLUCAP 165* 144* 156* 175* 214*    Discharge time spent: {LESS THAN/GREATER XBJY:78295} 30 minutes.  Signed: Merlene Laughter, DO [facility] 07/12/2022

## 2022-07-12 NOTE — Care Management Important Message (Signed)
Important Message  Patient Details  Name: Aryan Maida MRN: [id number] Date of Birth: 1965/06/24   Medicare Important Message Given:  Yes     Sherilyn Banker 07/12/2022, 3:14 PM

## 2022-07-12 NOTE — Progress Notes (Signed)
PT Cancellation Note  Patient Details Name: [name] MRN: [id number] DOB: 08-29-1965   Cancelled Treatment:    Reason Eval/Treat Not Completed: Patient declined, no reason specified  Spoke with pt and wife via interpretation service on ipad. Declines any further PT intervention, states he has been discharged and has his needed equipment. All questions answered.  Berton Mount 07/12/2022, 4:00 PM

## 2022-07-12 NOTE — Progress Notes (Signed)
[name] to be D/C'd Home with home health per MD order.  Discussed with the patient and all questions fully answered.  VSS, Skin clean, dry and intact without evidence of skin break down, no evidence of skin tears noted. IV catheters discontinued intact. Site without signs and symptoms of complications. Dressing and pressure applied.  An After Visit Summary was printed and given to the patient. Patient received prescriptions from [facility].  D/c education completed with patient/family including follow up instructions, medication list, d/c activities limitations if indicated, with other d/c instructions as indicated by MD - patient able to verbalize understanding, all questions fully answered.   Patient instructed to return to ED, call 911, or call MD for any changes in condition.   Patient escorted via electric WC, and D/C home via private auto.  Pauletta Browns 07/12/2022 3:54 PM

## 2022-07-22 ENCOUNTER — Ambulatory Visit (HOSPITAL_BASED_OUTPATIENT_CLINIC_OR_DEPARTMENT_OTHER): Payer: 59 | Admitting: General Surgery

## 2022-07-25 ENCOUNTER — Encounter (HOSPITAL_BASED_OUTPATIENT_CLINIC_OR_DEPARTMENT_OTHER): Payer: 59 | Attending: Internal Medicine | Admitting: Internal Medicine

## 2022-07-25 DIAGNOSIS — G8221 Paraplegia, complete: Secondary | ICD-10-CM | POA: Diagnosis not present

## 2022-07-25 DIAGNOSIS — E11622 Type 2 diabetes mellitus with other skin ulcer: Secondary | ICD-10-CM

## 2022-07-25 DIAGNOSIS — I872 Venous insufficiency (chronic) (peripheral): Secondary | ICD-10-CM | POA: Diagnosis not present

## 2022-07-25 DIAGNOSIS — Z7901 Long term (current) use of anticoagulants: Secondary | ICD-10-CM | POA: Insufficient documentation

## 2022-07-25 DIAGNOSIS — I890 Lymphedema, not elsewhere classified: Secondary | ICD-10-CM | POA: Insufficient documentation

## 2022-07-25 DIAGNOSIS — Z794 Long term (current) use of insulin: Secondary | ICD-10-CM | POA: Diagnosis not present

## 2022-07-25 DIAGNOSIS — L89154 Pressure ulcer of sacral region, stage 4: Secondary | ICD-10-CM

## 2022-07-25 DIAGNOSIS — Z833 Family history of diabetes mellitus: Secondary | ICD-10-CM | POA: Diagnosis not present

## 2022-07-25 DIAGNOSIS — E1151 Type 2 diabetes mellitus with diabetic peripheral angiopathy without gangrene: Secondary | ICD-10-CM | POA: Insufficient documentation

## 2022-07-27 NOTE — Progress Notes (Signed)
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 abdomen pelvis that did not show evidence of osteomyelitis but did suggest infection of the subcutaneous tissue on 5/25 he had irrigation and debridement of the sacral wound by Dr. Janee Morn with general surgery. He has been using saline wet-to-dry dressings since the surgery. Infectious disease recommended 4 [name] of oral antibiotics including Augmentin and doxycycline. While in the hospital he was on IV antibiotics for about 1 week. He has follow-up with infectious disease on 6/21. He was given an air mattress post discharge. He has home health that comes in weekly to help with assessment and dressing changes. Currently patient denies systemic signs of infection. Patient History Information obtained from Patient. Allergies No Known Allergies Family History Diabetes - Mother,Father,Siblings, Hypertension - Mother,Father, No family history of Cancer, Heart Disease, Hereditary Spherocytosis, Kidney Disease, Lung Disease, Seizures, Stroke, Thyroid  Problems. Social History Never smoker, Marital Status - Married, Alcohol Use - Rarely, Drug Use - No History, Caffeine Use - Daily. Medical History Eyes NATHANIAL, RIESEN ([id number]) [id number] Page 6 of 9 Denies history of Cataracts, Glaucoma, Optic Neuritis Ear/Nose/Mouth/Throat Denies history of Chronic sinus problems/congestion, Middle ear problems Hematologic/Lymphatic Denies history of Anemia, Hemophilia, Human Immunodeficiency  Virus, Lymphedema, Sickle Cell Disease Respiratory Denies history of Aspiration, Asthma, Chronic Obstructive Pulmonary Disease (COPD), Pneumothorax, Sleep Apnea, Tuberculosis Cardiovascular Patient has history of Hypotension Denies history of Angina, Arrhythmia, Congestive Heart Failure, Coronary Artery Disease, Deep Vein Thrombosis, Hypertension, Myocardial Infarction, Peripheral Arterial Disease, Peripheral Venous Disease, Phlebitis, Vasculitis Gastrointestinal Denies history of Cirrhosis , Colitis, Crohns, Hepatitis A, Hepatitis B, Hepatitis C Endocrine Patient has history of Type II Diabetes - HgbA1c 8.1 07/06/2022 Denies history of Type I Diabetes Genitourinary Denies history of End Stage Renal Disease Immunological Denies history of Lupus Erythematosus, Raynauds, Scleroderma Integumentary (Skin) Denies history of History of Burn Musculoskeletal Patient has history of Osteomyelitis - left foot 5th toe Denies history of Gout, Rheumatoid Arthritis, Osteoarthritis Neurologic Patient has history of Paraplegia Denies history of Dementia, Neuropathy, Quadriplegia, Seizure Disorder Oncologic Denies history of Received Chemotherapy, Received Radiation Psychiatric Denies history of Anorexia/bulimia, Confinement Anxiety Hospitalization/Surgery History - 07/06/2022 inpatient IandD general surgery; sepsis. 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 - cervical spine surgery. Medical A Surgical History Notes nd Cardiovascular IVF  filter Genitourinary neurogenic bowel Neurologic spinal cord injury T1-T6 Review of Systems (ROS) Integumentary (Skin) Complains or has symptoms of Wounds - sacral- IandD 07/06/2022. Objective Constitutional respirations regular, non-labored and within target range for patient.. Vitals Time Taken: 8:10 AM, Temperature: 97.9 F, Pulse: 68 bpm, Respiratory Rate: 18 breaths/min, Blood Pressure: 106/71 mmHg. Psychiatric pleasant and cooperative. General Notes: Sacrum: Large open wound with increased depth that does not probe to bone but very close. Granulation tissue throughout although not healthy in appearance. No obvious infection including increased warmth, erythema or purulent drainage. Integumentary (Hair, Skin) Wound #7 status is Open. Original cause of wound was Pressure Injury. The date acquired was: 07/12/2021. The wound is located on the Sacrum. The wound measures 9.2cm length x 9.7cm width x 3.3cm depth; 70.089cm^2 area and 231.293cm^3 volume. There is muscle exposed. There is no tunneling or undermining noted. There is a large amount of serosanguineous drainage noted. There is large (67-100%) red granulation within the wound bed. There is a small (1-33%) amount of necrotic tissue within the wound bed including Adherent Slough. The periwound skin appearance did not exhibit: Callus, Crepitus, Excoriation, Induration, Rash, Scarring, Dry/Scaly, Maceration, Atrophie Blanche, Cyanosis, Ecchymosis, Hemosiderin Staining, Mottled, Pallor, Rubor, Erythema. Periwound temperature was noted as No Abnormality. Assessment Active Problems ICD-10 Pressure ulcer of sacral region, stage 4 Type 2 diabetes mellitus with other skin ulcer Paraplegia, complete CONNARD, THORN ([id number]) [phone] Page 7 of 9 Patient presents with a 17-month history of nonhealing ulcer to the sacrum secondary to pressure from being paraplegic. The wound is fairly extensive however does not  probe to bone. He had a long discussion about the importance of aggressive offloading for wound healing. Wife was present during the encounter as well. He has an airflow mattress and a Roho cushion. I recommended Dakin's wet-to-dry dressings for now. He has follow-up next week with infectious disease. Plan is for 4 [name] of antibiotics. Once he completes this I think he would benefit from a wound VAC. He has home health that can help change the dressing. Follow-up in 2 [name]. Plan Follow-up Appointments: Return Appointment in 2 [name]. - Dr Mikey Bussing Bathing/ Shower/ Hygiene: May shower and wash wound with soap and water. - Dial gold antibacterial soap ( liquid) Off-Loading: Wound #7 Sacrum: Low air-loss mattress (Group 2) Roho cushion for wheelchair Turn and reposition every 2 hours Home Health: Wound #7 Sacrum: New wound care orders this week; continue Home Health for  wound care. May utilize formulary equivalent dressing for wound treatment orders unless otherwise specified. - Home Health to see pt 1xweek. Pack wound with Dakins wet to dry gauze, cover wound with Zetuvit boarder foam, may secure with cloth tape if needed WOUND #7: - Sacrum Wound Laterality: Cleanser: Soap and Water 1 x Per Day/15 Days Discharge Instructions: May shower and wash wound with dial antibacterial soap and water prior to dressing change. Cleanser: Wound Cleanser 1 x Per Day/15 Days Discharge Instructions: Cleanse the wound with wound cleanser prior to applying a clean dressing using gauze sponges, not tissue or cotton balls. Prim Dressing: Dakin's Solution 0.25%, 16 (oz) 1 x Per Day/15 Days ary Discharge Instructions: Moisten gauze with Dakin's solution Secondary Dressing: Woven Gauze Sponge, Non-Sterile 4x4 in (Generic) 1 x Per Day/15 Days Discharge Instructions: Apply over primary dressing as directed. Secondary Dressing: Zetuvit Plus Silicone Border Sacrum Dressing, Sm, 7x7 (in/in) (Generic) 1 x Per Day/15  Days Discharge Instructions: Apply silicone border over primary dressing as directed. Secured With: 86M Medipore H Soft Cloth Surgical T ape, 4 x 10 (in/yd) (Generic) 1 x Per Day/15 Days Discharge Instructions: Secure with tape as directed. 1. Dakin's wet-to-dry dressings 2. Aggressive offloadingair mattress and Roho cushion 3. Follow-up in 2 [name] 4. Follow-up with infectious disease 5. Continue oral antibiotics per ID Electronic Signature(s) Signed: 07/25/2022 12:16:14 PM By: Harry Corwin DO Entered By: [name] on 07/25/2022 09:44:25 -------------------------------------------------------------------------------- HxROS Details Patient Name: Date of Service: [name], [name] 07/25/2022 8:00 A M Medical Record Number: [id number] Patient Account Number: [id number] Date of Birth/Sex: Treating RN: 09/17/1965 (57 y.o. [name] Primary Care Provider: SYSTEM, PRO V IDER Other Clinician: Referring Provider: Treating Provider/Extender: Yevonne Aline, KELLY [name] in Treatment: 0 Information Obtained From Patient Integumentary (Skin) Complaints and Symptoms: Positive for: Wounds - sacral- IandD 07/06/2022 Medical History: Negative for: History of Burn MARKELLE, NELLI ([id number]) [phone].pdf Page 8 of 9 Eyes Medical History: Negative for: Cataracts; Glaucoma; Optic Neuritis Ear/Nose/Mouth/Throat Medical History: Negative for: Chronic sinus problems/congestion; Middle ear problems Hematologic/Lymphatic Medical History: Negative for: Anemia; Hemophilia; Human Immunodeficiency Virus; Lymphedema; Sickle Cell Disease Respiratory Medical History: Negative for: Aspiration; Asthma; Chronic Obstructive Pulmonary Disease (COPD); Pneumothorax; Sleep Apnea; Tuberculosis Cardiovascular Medical History: Positive for: Hypotension Negative for: Angina; Arrhythmia; Congestive Heart Failure; Coronary Artery Disease; Deep Vein Thrombosis;  Hypertension; Myocardial Infarction; Peripheral Arterial Disease; Peripheral Venous Disease; Phlebitis; Vasculitis Past Medical History Notes: IVF filter Gastrointestinal Medical History: Negative for: Cirrhosis ; Colitis; Crohns; Hepatitis A; Hepatitis B; Hepatitis C Endocrine Medical History: Positive for: Type II Diabetes - HgbA1c 8.1 07/06/2022 Negative for: Type I Diabetes Time with diabetes: 5 years Treated with: Oral agents Blood sugar tested every day: No Genitourinary Medical History: Negative for: End Stage Renal Disease Past Medical History Notes: neurogenic bowel Immunological Medical History: Negative for: Lupus Erythematosus; Raynauds; Scleroderma Musculoskeletal Medical History: Positive for: Osteomyelitis - left foot 5th toe Negative for: Gout; Rheumatoid Arthritis; Osteoarthritis Neurologic Medical History: Positive for: Paraplegia Negative for: Dementia; Neuropathy; Quadriplegia; Seizure Disorder Past Medical History Notes: spinal cord injury T1-T6 Oncologic Medical History: Negative for: Received Chemotherapy; Received Radiation Psychiatric Medical History: Negative for: Wyn Quaker TARIA, WISECUP ([id number]) [id number] Page 9 of 9 Immunizations Pneumococcal Vaccine: Received Pneumococcal Vaccination: No Implantable Devices None Hospitalization / Surgery History Type of Hospitalization/Surgery 07/06/2022 inpatient IandD general surgery; sepsis 08/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 cervical spine surgery Family and Social History Cancer: No; Diabetes: Yes - Mother,Father,Siblings; Heart Disease: No; Hereditary  Spherocytosis: No; Hypertension: Yes - Mother,Father; Kidney Disease: No; Lung Disease: No; Seizures: No; Stroke: No; Thyroid Problems: No; Never smoker; Marital Status - Married; Alcohol Use: Rarely; Drug Use: No History; Caffeine Use: Daily; Financial Concerns: No; Food, Clothing or Shelter  Needs: No; Support System Lacking: No; Transportation Concerns: No Electronic Signature(s) Signed: 07/25/2022 12:16:14 PM By: Harry Corwin DO Signed: 07/25/2022 4:11:58 PM By: Redmond Pulling RN, BSN Signed: 07/25/2022 5:31:32 PM By: Shawn Stall RN, BSN Entered By: Redmond Pulling on 07/25/2022 08:11:53 -------------------------------------------------------------------------------- SuperBill Details Patient Name: Date of Service: [name], [name] 07/25/2022 Medical Record Number: [id number] Patient Account Number: [id number] Date of Birth/Sex: Treating RN: September 24, 1965 (57 y.o. M) Primary Care Provider: SYSTEM, PRO V IDER Other Clinician: Referring Provider: Treating Provider/Extender: Yevonne Aline, KELLY [name] in Treatment: 0 Diagnosis Coding ICD-10 Codes Code Description L89.154 Pressure ulcer of sacral region, stage 4 E11.622 Type 2 diabetes mellitus with other skin ulcer G82.21 Paraplegia, complete Facility Procedures : CPT4 Code: [id number] Description: 9214 - WOUND CARE VISIT-LEV 4 EST PT Modifier: Quantity: 1 Physician Procedures : CPT4 Code Description Modifier [id number] 99204 - WC PHYS LEVEL 4 - NEW PT ICD-10 Diagnosis Description L89.154 Pressure ulcer of sacral region, stage 4 E11.622 Type 2 diabetes mellitus with other skin ulcer G82.21 Paraplegia, complete Quantity: 1 Electronic Signature(s) Signed: 07/25/2022 12:16:14 PM By: Harry Corwin DO Signed: 07/25/2022 4:11:58 PM By: Redmond Pulling RN, BSN Entered By: Redmond Pulling on 07/25/2022 09:45:59

## 2022-07-27 NOTE — Progress Notes (Signed)
Harry, [name] ([id number]) [id number] Page 1 of 4 Visit Report for 07/25/2022 Abuse Risk Screen Details Patient Name: Date of Service: [name] 07/25/2022 8:00 A M Medical Record Number: [id number] Patient Account Number: [id number] Date of Birth/Sex: Treating RN: Apr 09, 1965 (57 y.o. Cline Cools Primary Care Annalaura Sauseda: SYSTEM, PRO Rodena Goldmann Other Clinician: Referring Jahaziel Francois: Treating Vernestine Brodhead/Extender: [name], KELLY Weeks in Treatment: 0 Abuse Risk Screen Items Answer ABUSE RISK SCREEN: Has anyone close to you tried to hurt or harm you recentlyo No Do you feel uncomfortable with anyone in your familyo No Has anyone forced you do things that you didnt want to doo No Electronic Signature(s) Signed: 07/25/2022 4:11:58 PM By: Redmond Pulling RN, BSN Entered By: Redmond Pulling on 07/25/2022 08:12:01 -------------------------------------------------------------------------------- Activities of Daily Living Details Patient Name: Date of Service: [name] 07/25/2022 8:00 A M Medical Record Number: [id number] Patient Account Number: [id number] Date of Birth/Sex: Treating RN: October 12, 1965 (57 y.o. [name] Primary Care Nykole Matos: SYSTEM, PRO V IDER Other Clinician: Referring Kaysa Roulhac: Treating Laisa Larrick/Extender: [name], KELLY Weeks in Treatment: 0 Activities of Daily Living Items Answer Activities of Daily Living (Please select one for each item) Drive Automobile Not Able T Medications ake Need Assistance Use T elephone Need Assistance Care for Appearance Need Assistance Use T oilet Not Able Bath / Shower Not Able Dress Self Not Able Feed Self Need Assistance Walk Not Able Get In / Out Bed Not Able Housework Not Able Prepare Meals Not Able Handle Money Not Able Shop for Self Not Able Electronic Signature(s) Signed: 07/25/2022 4:11:58 PM By: Redmond Pulling RN, BSN Signed:  07/25/2022 5:31:32 PM By: Shawn Stall RN, BSN Entered By: Redmond Pulling on 07/25/2022 08:12:08 [name] ([id number]) [id number] Page 2 of 4 -------------------------------------------------------------------------------- Education Screening Details Patient Name: Date of Service: [name] 07/25/2022 8:00 A M Medical Record Number: [id number] Patient Account Number: [id number] Date of Birth/Sex: Treating RN: 1965-07-02 (57 y.o. Cline Cools Primary Care Leonidas Boateng: SYSTEM, PRO Rodena Goldmann Other Clinician: Referring Alexxander Kurt: Treating Lorey Pallett/Extender: Dorie Rank Weeks in Treatment: 0 Learning Preferences/Education Level/Primary Language Learning Preference: Explanation, Demonstration, Printed Material Preferred Language: Spanish; Castilian Cognitive Barrier Language Barrier: No Translator Needed: No Memory Deficit: No Emotional Barrier: No Cultural/Religious Beliefs Affecting Medical Care: No Physical Barrier Impaired Vision: No Impaired Hearing: No Decreased Hand dexterity: No Knowledge/Comprehension Knowledge Level: High Comprehension Level: High Ability to understand written instructions: High Ability to understand verbal instructions: High Motivation Anxiety Level: Calm Cooperation: Cooperative Education Importance: Acknowledges Need Interest in Health Problems: Asks Questions Perception: Coherent Willingness to Engage in Self-Management High Activities: Readiness to Engage in Self-Management High Activities: Electronic Signature(s) Signed: 07/25/2022 4:11:58 PM By: Redmond Pulling RN, BSN Entered By: Redmond Pulling on 07/25/2022 08:12:38 -------------------------------------------------------------------------------- Fall Risk Assessment Details Patient Name: Date of Service: [name], [name] 07/25/2022 8:00 A M Medical Record Number: [id number] Patient Account Number: [id number] Date of  Birth/Sex: Treating RN: November 16, 1965 (57 y.o. Cline Cools Primary Care Cordell Coke: SYSTEM, PRO V IDER Other Clinician: Referring [name]: Treating Mamoudou Mulvehill/Extender: Dorie Rank Weeks in Treatment: 0 Fall Risk Assessment Items Have you had 2 or more falls in the last 12 monthso 0 No Have you had any fall that resulted in injury in the last 12 monthso 0 No Bruington, Kimble ([id number]) [phone] [id number] Page 3 of 4 FALLS RISK SCREEN History of falling -  immediate or within 3 months 0 No Secondary diagnosis (Do you have 2 or more medical diagnoseso) 0 No Ambulatory aid None/bed rest/wheelchair/nurse 0 Yes Crutches/cane/walker 0 No Furniture 0 No Intravenous therapy Access/Saline/Heparin Lock 0 No Gait/Transferring Normal/ bed rest/ wheelchair 0 Yes Weak (short steps with or without shuffle, stooped but able to lift head while walking, may seek 0 No support from furniture) Impaired (short steps with shuffle, may have difficulty arising from chair, head down, impaired 0 No balance) Mental Status Oriented to own ability 0 Yes Electronic Signature(s) Signed: 07/25/2022 4:11:58 PM By: Redmond Pulling RN, BSN Entered By: Redmond Pulling on 07/25/2022 08:12:54 -------------------------------------------------------------------------------- Foot Assessment Details Patient Name: Date of Service: [name], [name] 07/25/2022 8:00 A M Medical Record Number: [id number] Patient Account Number: [id number] Date of Birth/Sex: Treating RN: 1965/06/25 (57 y.o. Cline Cools Primary Care Makaylee Spielberg: SYSTEM, PRO V IDER Other Clinician: Referring Justice Aguirre: Treating [name]/Extender: [name], KELLY Weeks in Treatment: 0 Foot Assessment Items Site Locations + = Sensation present, - = Sensation absent, C = Callus, U = Ulcer R = Redness, W = Warmth, M = Maceration, PU = Pre-ulcerative lesion F = Fissure, S = Swelling, D =  Dryness Assessment Right: Left: Other Deformity: No No Prior Foot Ulcer: No No Prior Amputation: No No Charcot Joint: No No Ambulatory Status: Non-ambulatory Assistance Device: Wheelchair GaitQWENTIN, MANER ([id number]) [id number] Page 4 of 4 Electronic Signature(s) Signed: 07/25/2022 4:11:58 PM By: Redmond Pulling RN, BSN Entered By: Redmond Pulling on 07/25/2022 08:14:32 -------------------------------------------------------------------------------- Nutrition Risk Screening Details Patient Name: Date of Service: [name], [place] RLO S 07/25/2022 8:00 A M Medical Record Number: [id number] Patient Account Number: [id number] Date of Birth/Sex: Treating RN: 06/19/1965 (57 y.o. Cline Cools Primary Care Grayling Schranz: SYSTEM, PRO Rodena Goldmann Other Clinician: Referring Cassara Nida: Treating Anjani Feuerborn/Extender: [name], KELLY Weeks in Treatment: 0 Height (in): Weight (lbs): Body Mass Index (BMI): Nutrition Risk Screening Items Score Screening NUTRITION RISK SCREEN: I have an illness or condition that made me change the kind and/or amount of food I eat 0 No I eat fewer than two meals per day 0 No I eat few fruits and vegetables, or milk products 0 No I have three or more drinks of beer, liquor or wine almost every day 0 No I have tooth or mouth problems that make it hard for me to eat 0 No I don't always have enough money to buy the food I need 0 No I eat alone most of the time 0 No I take three or more different prescribed or over-the-counter drugs a day 1 Yes Without wanting to, I have lost or gained 10 pounds in the last six months 0 No I am not always physically able to shop, cook and/or feed myself 0 No Nutrition Protocols Good Risk Protocol Moderate Risk Protocol High Risk Proctocol Risk Level: Good Risk Score: 1 Electronic Signature(s) Signed: 07/25/2022 4:11:58 PM By: Redmond Pulling RN, BSN Entered By: Redmond Pulling on  07/25/2022 08:13:30

## 2022-07-27 NOTE — Progress Notes (Signed)
Harry, [name] ([id number]) [id number] Page 1 of 9 Visit Report for 07/25/2022 Allergy List Details Patient Name: Date of Service: [name], [place] 07/25/2022 8:00 A M Medical Record Number: [id number] Patient Account Number: [id number] Date of Birth/Sex: Treating RN: 05-Aug-1965 (57 y.o. [name] Primary Care Makani Seckman: SYSTEM, PRO V IDER Other Clinician: Referring Mattelyn Imhoff: Treating Evonne Rinks/Extender: Yevonne Aline, KELLY Weeks in Treatment: 0 Allergies Active Allergies No Known Allergies Allergy Notes Electronic Signature(s) Signed: 07/25/2022 4:11:58 PM By: Redmond Pulling RN, BSN Entered By: Redmond Pulling on 07/25/2022 08:11:17 -------------------------------------------------------------------------------- Arrival Information Details Patient Name: Date of Service: [name], [name] 07/25/2022 8:00 A M Medical Record Number: [id number] Patient Account Number: [id number] Date of Birth/Sex: Treating RN: Aug 25, 1965 (57 y.o. [name] Primary Care Rindi Beechy: SYSTEM, PRO V IDER Other Clinician: Referring Vasilisa Vore: Treating Karsen Nakanishi/Extender: Dorie Rank Weeks in Treatment: 0 Visit Information Patient Arrived: Wheel Chair Arrival Time: 08:08 Accompanied By: wife Transfer Assistance: Nurse, adult Patient Identification Verified: Yes Secondary Verification Process Completed: Yes History Since Last Visit Added or deleted any medications: Yes Any new allergies or adverse reactions: No Had a fall or experienced change in activities of daily living that may affect risk of falls: No Signs or symptoms of abuse/neglect since last visito No Hospitalized since last visit: Yes Implantable device outside of the clinic excluding cellular tissue based products placed in the center since last visit: No Has Dressing in Place as Prescribed: Yes Electronic Signature(s) Signed: 07/25/2022 4:11:58 PM By: Redmond Pulling RN,  BSN Entered By: Redmond Pulling on 07/25/2022 08:10:31 [name] ([id number]) [id number] Page 2 of 9 -------------------------------------------------------------------------------- Clinic Level of Care Assessment Details Patient Name: Date of Service: [name] 07/25/2022 8:00 A M Medical Record Number: [id number] Patient Account Number: [id number] Date of Birth/Sex: Treating RN: 06-01-1965 (57 y.o. [name] Primary Care Brylon Brenning: SYSTEM, PRO Rodena Goldmann Other Clinician: Referring Anique Beckley: Treating Ernst Cumpston/Extender: Yevonne Aline, KELLY Weeks in Treatment: 0 Clinic Level of Care Assessment Items TOOL 2 Quantity Score X- 1 0 Use when only an EandM is performed on the INITIAL visit ASSESSMENTS - Nursing Assessment / Reassessment X- 1 20 General Physical Exam (combine w/ comprehensive assessment (listed just below) when performed on new pt. evals) X- 1 25 Comprehensive Assessment (HX, ROS, Risk Assessments, Wounds Hx, etc.) ASSESSMENTS - Wound and Skin A ssessment / Reassessment X - Simple Wound Assessment / Reassessment - one wound 1 5 []  - 0 Complex Wound Assessment / Reassessment - multiple wounds []  - 0 Dermatologic / Skin Assessment (not related to wound area) ASSESSMENTS - Ostomy and/or Continence Assessment and Care []  - 0 Incontinence Assessment and Management []  - 0 Ostomy Care Assessment and Management (repouching, etc.) PROCESS - Coordination of Care X - Simple Patient / Family Education for ongoing care 1 15 []  - 0 Complex (extensive) Patient / Family Education for ongoing care X- 1 10 Staff obtains Chiropractor, Records, T Results / Process Orders est X- 1 10 Staff telephones HHA, Nursing Homes / Clarify orders / etc []  - 0 Routine Transfer to another Facility (non-emergent condition) []  - 0 Routine Hospital Admission (non-emergent condition) X- 1 15 New Admissions / Manufacturing engineer / Ordering  NPWT Apligraf, etc. , []  - 0 Emergency Hospital Admission (emergent condition) []  - 0 Simple Discharge Coordination []  - 0 Complex (extensive) Discharge Coordination PROCESS - Special Needs []  - 0 Pediatric / Minor Patient Management []  -  0 Isolation Patient Management []  - 0 Hearing / Language / Visual special needs []  - 0 Assessment of Community assistance (transportation, D/C planning, etc.) []  - 0 Additional assistance / Altered mentation []  - 0 Support Surface(s) Assessment (bed, cushion, seat, etc.) INTERVENTIONS - Wound Cleansing / Measurement X- 1 5 Wound Imaging (photographs - any number of wounds) []  - 0 Wound Tracing (instead of photographs) X- 1 5 Simple Wound Measurement - one wound []  - 0 Complex Wound Measurement - multiple wounds X- 1 5 Simple Wound Cleansing - one wound []  - 0 Complex Wound Cleansing - multiple wounds INTERVENTIONS - Wound Dressings X - Small Wound Dressing one or multiple wounds 1 10 Harry, [name] ([id number]) [id number] Page 3 of 9 []  - 0 Medium Wound Dressing one or multiple wounds []  - 0 Large Wound Dressing one or multiple wounds []  - 0 Application of Medications - injection INTERVENTIONS - Miscellaneous []  - 0 External ear exam []  - 0 Specimen Collection (cultures, biopsies, blood, body fluids, etc.) []  - 0 Specimen(s) / Culture(s) sent or taken to Lab for analysis X- 1 10 Patient Transfer (multiple staff / Nurse, adult / Similar devices) []  - 0 Simple Staple / Suture removal (25 or less) []  - 0 Complex Staple / Suture removal (26 or more) []  - 0 Hypo / Hyperglycemic Management (close monitor of Blood Glucose) []  - 0 Ankle / Brachial Index (ABI) - do not check if billed separately Has the patient been seen at the hospital within the last three years: Yes Total Score: 135 Level Of Care: New/Established - Level 4 Electronic Signature(s) Signed: 07/25/2022 4:11:58 PM By: Redmond Pulling RN,  BSN Entered By: Redmond Pulling on 07/25/2022 09:45:51 -------------------------------------------------------------------------------- Encounter Discharge Information Details Patient Name: Date of Service: [name], [name] 07/25/2022 8:00 A M Medical Record Number: [id number] Patient Account Number: [id number] Date of Birth/Sex: Treating RN: 04-19-1965 (57 y.o. [name] Primary Care Angalena Cousineau: SYSTEM, PRO Rodena Goldmann Other Clinician: Referring Charlye Spare: Treating Cierrah Dace/Extender: Dorie Rank Weeks in Treatment: 0 Encounter Discharge Information Items Discharge Condition: Stable Ambulatory Status: Wheelchair Discharge Destination: Home Transportation: Private Auto Accompanied By: self Schedule Follow-up Appointment: Yes Clinical Summary of Care: Patient Declined Electronic Signature(s) Signed: 07/25/2022 4:11:58 PM By: Redmond Pulling RN, BSN Entered By: Redmond Pulling on 07/25/2022 09:46:46 -------------------------------------------------------------------------------- Lower Extremity Assessment Details Patient Name: Date of Service: [name], [name] 07/25/2022 8:00 A M Medical Record Number: [id number] Patient Account Number: [id number] Date of Birth/Sex: Treating RN: 05-18-1965 (57 y.o. [name] Primary Care Azriella Mattia: SYSTEM, PRO V IDER Other Clinician: Referring Clydean Posas: Treating Josealberto Montalto/Extender: Dorie Rank Weeks in TreatmentIzora Gala [place], [name] ([id number]) [id number] Page 4 of 9 Electronic Signature(s) Signed: 07/25/2022 4:11:58 PM By: Redmond Pulling RN, BSN Entered By: Redmond Pulling on 07/25/2022 08:14:39 -------------------------------------------------------------------------------- Multi Wound Chart Details Patient Name: Date of Service: [name] 07/25/2022 8:00 A M Medical Record Number: [id number] Patient Account Number: [id number] Date of Birth/Sex: Treating  RN: 07-26-1965 (57 y.o. M) Primary Care Sharona Rovner: SYSTEM, PRO V IDER Other Clinician: Referring Timiyah Romito: Treating Briseyda Fehr/Extender: Yevonne Aline, KELLY Weeks in Treatment: 0 Vital Signs Height(in): Pulse(bpm): 68 Weight(lbs): Blood Pressure(mmHg): 106/71 Body Mass Index(BMI): Temperature(F): 97.9 Respiratory Rate(breaths/min): 18 [7:Photos:] [N/A:N/A] Sacrum N/A N/A Wound Location: Pressure Injury N/A N/A Wounding Event: Pressure Ulcer N/A N/A Primary Etiology: Hypotension, Type II Diabetes, N/A N/A Comorbid History: Osteomyelitis, Paraplegia 07/12/2021 N/A N/A Date Acquired: 0  N/A N/A Weeks of Treatment: Open N/A N/A Wound Status: No N/A N/A Wound Recurrence: 9.2x9.7x3.3 N/A N/A Measurements L x W x D (cm) 70.089 N/A N/A A (cm) : rea 231.293 N/A N/A Volume (cm) : Category/Stage IV N/A N/A Classification: Large N/A N/A Exudate A mount: Serosanguineous N/A N/A Exudate Type: red, brown N/A N/A Exudate Color: Large (67-100%) N/A N/A Granulation A mount: Red N/A N/A Granulation Quality: Small (1-33%) N/A N/A Necrotic A mount: Muscle: Yes N/A N/A Exposed Structures: Fascia: No Fat Layer (Subcutaneous Tissue): No Tendon: No Joint: No Bone: No None N/A N/A Epithelialization: Excoriation: No N/A N/A Periwound Skin Texture: Induration: No Callus: No Crepitus: No Rash: No Scarring: No Maceration: No N/A N/A Periwound Skin Moisture: Dry/Scaly: No Atrophie Blanche: No N/A N/A Periwound Skin Color: Cyanosis: No Ecchymosis: No Erythema: No Hemosiderin Staining: No NEHAN, BROCKS ([id number]) [id number] Page 5 of 9 Mottled: No Pallor: No Rubor: No No Abnormality N/A N/A Temperature: Treatment Notes Electronic Signature(s) Signed: 07/25/2022 12:16:14 PM By: Geralyn Corwin DO Entered By: Geralyn Corwin on 07/25/2022  09:26:39 -------------------------------------------------------------------------------- Multi-Disciplinary Care Plan Details Patient Name: Date of Service: [name], [name] 07/25/2022 8:00 A M Medical Record Number: [id number] Patient Account Number: [id number] Date of Birth/Sex: Treating RN: Jan 30, 1966 (57 y.o. [name] Primary Care Tramon Crescenzo: SYSTEM, PRO V IDER Other Clinician: Referring Rosalynd Mcwright: Treating Leylany Nored/Extender: Yevonne Aline, KELLY Weeks in Treatment: 0 Active Inactive Pressure Nursing Diagnoses: Knowledge deficit related to causes and risk factors for pressure ulcer development Knowledge deficit related to management of pressures ulcers Goals: Patient will remain free from development of additional pressure ulcers Date Initiated: 07/25/2022 Target Resolution Date: 09/19/2022 Goal Status: Active Patient will remain free of pressure ulcers Date Initiated: 07/25/2022 Target Resolution Date: 09/19/2022 Goal Status: Active Patient/caregiver will verbalize risk factors for pressure ulcer development Date Initiated: 07/25/2022 Target Resolution Date: 08/08/2022 Goal Status: Active Patient/caregiver will verbalize understanding of pressure ulcer management Date Initiated: 07/25/2022 Target Resolution Date: 08/29/2022 Goal Status: Active Interventions: Assess: immobility, friction, shearing, incontinence upon admission and as needed Assess offloading mechanisms upon admission and as needed Assess potential for pressure ulcer upon admission and as needed Provide education on pressure ulcers Notes: Wound/Skin Impairment Nursing Diagnoses: Impaired tissue integrity Knowledge deficit related to ulceration/compromised skin integrity Goals: Patient will have a decrease in wound volume by X% from date: (specify in notes) Date Initiated: 07/25/2022 Target Resolution Date: 10/17/2022 Goal Status: Active Patient/caregiver will verbalize understanding of skin  care regimen Date Initiated: 07/25/2022 Target Resolution Date: 08/22/2022 Goal Status: Active Ulcer/skin breakdown will have a volume reduction of 30% by week 4 Date Initiated: 07/25/2022 Target Resolution Date: 08/22/2022 DURAN, COELLO ([id number]) [id number] Page 6 of 9 Goal Status: Active Interventions: Assess patient/caregiver ability to obtain necessary supplies Assess patient/caregiver ability to perform ulcer/skin care regimen upon admission and as needed Assess ulceration(s) every visit Provide education on ulcer and skin care Notes: Electronic Signature(s) Signed: 07/25/2022 4:11:58 PM By: Redmond Pulling RN, BSN Entered By: Redmond Pulling on 07/25/2022 08:42:52 -------------------------------------------------------------------------------- Pain Assessment Details Patient Name: Date of Service: [name], [name] 07/25/2022 8:00 A M Medical Record Number: [id number] Patient Account Number: [id number] Date of Birth/Sex: Treating RN: 10/19/1965 (58 y.o. [name] Primary Care Meg Niemeier: SYSTEM, PRO Rodena Goldmann Other Clinician: Referring Melanie Pellot: Treating Ester Mabe/Extender: Yevonne Aline, KELLY Weeks in Treatment: 0 Active Problems Location of Pain Severity and Description of Pain Patient Has Paino No Site Locations Pain Management and Medication  Current Pain Management: Electronic Signature(s) Signed: 07/25/2022 4:11:58 PM By: Redmond Pulling RN, BSN Entered By: Redmond Pulling on 07/25/2022 08:34:42 -------------------------------------------------------------------------------- Patient/Caregiver Education Details Patient Name: Date of Service: [name] 6/13/2024andnbsp8:00 A M Medical Record Number: [id number] Patient Account Number: [id number] ABDULKAREEM, GOODLY ([id number]) [id number] Page 7 of 9 Date of Birth/Gender: Treating RN: 07/24/1965 (57 y.o. [name] Primary Care Physician:  SYSTEM, PRO V IDER Other Clinician: Referring Physician: Treating Physician/Extender: Dorie Rank Weeks in Treatment: 0 Education Assessment Education Provided To: Patient Education Topics Provided Pressure: Handouts: Presure Ulcers: Care and Offloading Spanish Methods: Explain/Verbal, Printed Responses: State content correctly Wound/Skin Impairment: Handouts: Caring for Your Ulcer Spanish Methods: Explain/Verbal, Printed Responses: State content correctly Electronic Signature(s) Signed: 07/25/2022 4:11:58 PM By: Redmond Pulling RN, BSN Entered By: Redmond Pulling on 07/25/2022 08:43:47 -------------------------------------------------------------------------------- Wound Assessment Details Patient Name: Date of Service: [name], [name] 07/25/2022 8:00 A M Medical Record Number: [id number] Patient Account Number: [id number] Date of Birth/Sex: Treating RN: 1965-03-15 (57 y.o. [name] Primary Care Joana Nolton: SYSTEM, PRO V IDER Other Clinician: Referring Donis Pinder: Treating Glema Takaki/Extender: Yevonne Aline, KELLY Weeks in Treatment: 0 Wound Status Wound Number: 7 Primary Etiology: Pressure Ulcer Wound Location: Sacrum Wound Status: Open Wounding Event: Pressure Injury Comorbid History: Hypotension, Type II Diabetes, Osteomyelitis, Paraplegia Date Acquired: 07/12/2021 Weeks Of Treatment: 0 Clustered Wound: No Photos Wound Measurements Length: (cm) 9.2 Width: (cm) 9.7 Depth: (cm) 3.3 Area: (cm) 70.089 Volume: (cm) 231.293 AQIL, WAHLE ([id number]) Wound Description Classification: Category/Stage IV Exudate Amount: Large Exudate Type: Serosanguineous Exudate Color: red, brown Foul Odor After Cleansing: Slough/Fibrino % Reduction in Area: % Reduction in Volume: Epithelialization: None Tunneling: No Undermining: No [id number] Page 8 of 9 No Yes Wound Bed Granulation Amount: Large (67-100%)  Exposed Structure Granulation Quality: Red Fascia Exposed: No Necrotic Amount: Small (1-33%) Fat Layer (Subcutaneous Tissue) Exposed: No Necrotic Quality: Adherent Slough Tendon Exposed: No Muscle Exposed: Yes Necrosis of Muscle: No Joint Exposed: No Bone Exposed: No Periwound Skin Texture Texture Color No Abnormalities Noted: No No Abnormalities Noted: No Callus: No Atrophie Blanche: No Crepitus: No Cyanosis: No Excoriation: No Ecchymosis: No Induration: No Erythema: No Rash: No Hemosiderin Staining: No Scarring: No Mottled: No Pallor: No Moisture Rubor: No No Abnormalities Noted: No Dry / Scaly: No Temperature / Pain Maceration: No Temperature: No Abnormality Treatment Notes Wound #7 (Sacrum) Cleanser Soap and Water Discharge Instruction: May shower and wash wound with dial antibacterial soap and water prior to dressing change. Wound Cleanser Discharge Instruction: Cleanse the wound with wound cleanser prior to applying a clean dressing using gauze sponges, not tissue or cotton balls. Peri-Wound Care Topical Primary Dressing Dakin's Solution 0.25%, 16 (oz) Discharge Instruction: Moisten gauze with Dakin's solution Secondary Dressing Woven Gauze Sponge, Non-Sterile 4x4 in Discharge Instruction: Apply over primary dressing as directed. Zetuvit Plus Silicone Border Sacrum Dressing, Sm, 7x7 (in/in) Discharge Instruction: Apply silicone border over primary dressing as directed. Secured With 84M Medipore H Soft Cloth Surgical T ape, 4 x 10 (in/yd) Discharge Instruction: Secure with tape as directed. Compression Wrap Compression Stockings Add-Ons Electronic Signature(s) Signed: 07/25/2022 12:16:14 PM By: Geralyn Corwin DO Signed: 07/25/2022 4:11:58 PM By: Redmond Pulling RN, BSN Entered By: Geralyn Corwin on 07/25/2022 09:20:46 [name] ([id number]) [id number] Page 9 of  9 -------------------------------------------------------------------------------- Vitals Details Patient Name: Date of Service: [name] 07/25/2022 8:00 A M Medical Record Number: [id number] Patient Account Number: [id number] Date of  Birth/Sex: Treating RN: [birth year] (56 y.o. [name] Primary Care Kasmira Cacioppo: SYSTEM, PRO V IDER Other Clinician: Referring Cornelia Walraven: Treating Gerilyn Stargell/Extender: Yevonne Aline, KELLY Weeks in Treatment: 0 Vital Signs Time Taken: 08:10 Temperature (F): 97.9 Pulse (bpm): 68 Respiratory Rate (breaths/min): 18 Blood Pressure (mmHg): 106/71 Reference Range: 80 - 120 mg / dl Electronic Signature(s) Signed: 07/25/2022 4:11:58 PM By: Redmond Pulling RN, BSN Entered By: Redmond Pulling on 07/25/2022 08:11:13

## 2022-08-02 ENCOUNTER — Ambulatory Visit (INDEPENDENT_AMBULATORY_CARE_PROVIDER_SITE_OTHER): Payer: 59 | Admitting: Family

## 2022-08-02 ENCOUNTER — Encounter: Payer: Self-pay | Admitting: Family

## 2022-08-02 ENCOUNTER — Other Ambulatory Visit: Payer: Self-pay

## 2022-08-02 VITALS — BP 104/71 | HR 67 | Ht 67.9 in | Wt 280.0 lb

## 2022-08-02 DIAGNOSIS — L03317 Cellulitis of buttock: Secondary | ICD-10-CM

## 2022-08-02 LAB — CBC
MCH: 28.2 pg (ref 27.0–33.0)
MCV: 86.6 fL (ref 80.0–100.0)
MPV: 10.3 fL (ref 7.5–12.5)
RBC: 3.97 10*6/uL — ABNORMAL LOW (ref 4.20–5.80)

## 2022-08-02 LAB — SEDIMENTATION RATE: Sed Rate: 77 mm/h — ABNORMAL HIGH (ref 0–20)

## 2022-08-02 NOTE — Progress Notes (Signed)
Subjective:    Patient ID: [name], male    DOB: 17-Dec-1965, 57 y.o.   MRN: [id number]  Chief Complaint  Patient presents with   Hospitalization Follow-up    HPI:  [name] is a 57 y.o. male with left gluteal cellulitis last seen on 07/10/22 presenting today for hospitalization follow up. [name] primary preferred language is Spanish and a medical interpreter is present to aid in communication.   [name] was recently hospitalized with polymicrobial wound infection with superficial wound and plan of treatment for 4 weeks of broad spectrum coverage with doxycycline and amoxicillin/clavulanate along with continued wound care. Has been doing well since leaving the hospital and has noted significant improvements in his wound. Tolerating medications with no adverse side effects. No fevers or chills. Working on off loading site and optimizing protein intake. Drainage from wound has been minimal with no foul odors.   No Known Allergies    Outpatient Medications Prior to Visit  Medication Sig Dispense Refill   acetaminophen (TYLENOL) 500 MG tablet Take 500 mg by mouth every 6 (six) hours as needed for mild pain.     apixaban (ELIQUIS) 5 MG TABS tablet Take 1 tablet by mouth 2 (two) times daily.     baclofen (LIORESAL) 10 MG tablet TAKE 1 TABLET BY MOUTH 3 TIMES DAILY AS NEEDED FOR MUSCLE SPASMS OR tightness (Patient taking differently: Take 10 mg by mouth 3 (three) times daily as needed for muscle spasms (or tightness).) 90 tablet 5   BISACODYL LAXATIVE RE Place 1 suppository rectally at bedtime.     blood glucose meter kit and supplies Dispense based on patient and insurance preference. Use up to four times daily as directed. (FOR ICD-10 E10.9, E11.9). 1 each 0   Blood Glucose Monitoring Suppl (PRECISION XTRA) DEVI by Does not apply route.     Carboxymethylcellulose Sodium (ARTIFICIAL TEARS OP) Apply 1-2 drops to eye daily as needed (dry eyes).      Continuous Glucose  Sensor (DEXCOM G7 SENSOR) MISC 1 Each by miscellaneous route every 10 (ten) days     docusate sodium (COLACE) 100 MG capsule Take 1 capsule (100 mg total) by mouth 2 (two) times daily for 5 days, then as directed by physician 100 capsule 0   gabapentin (NEURONTIN) 600 MG tablet TAKE 1 TABLET BY MOUTH 3 TIMES DAILY (Patient taking differently: Take 600 mg by mouth 3 (three) times daily.) 270 tablet 3   glipiZIDE (GLUCOTROL) 10 MG tablet Take 1 tablet by mouth 2 (two) times daily.     glucose blood (FREESTYLE TEST STRIPS) test strip 1 Each daily.     HYDROcodone-acetaminophen (NORCO) 5-325 MG tablet Take 1 tablet by mouth 2 (two) times daily as needed for moderate pain. 30 tablet 0   insulin glargine (LANTUS) 100 UNIT/ML injection Inject 30 Units into the skin at bedtime.     Insulin Syringe-Needle U-100 (MAGELLAN INSULIN SAFETY SYR) 30G X 5/16" 1 ML MISC 1 Each by miscellaneous route nightly at bedtime Use with injection of insulin one time at night     Lancets MISC daily.     lisinopril (ZESTRIL) 10 MG tablet Take 10 mg by mouth daily.     ondansetron (ZOFRAN) 4 MG tablet Take 1 tablet (4 mg total) by mouth every 6 (six) hours as needed for nausea. 20 tablet 0   senna (SENOKOT) 8.6 MG TABS tablet Take 1 tablet by mouth daily.     sildenafil (VIAGRA) 100 MG tablet Take  1 tablet (100 mg total) by mouth daily as needed for erectile dysfunction. 30 tablet 3   sitaGLIPtin (JANUVIA) 100 MG tablet Take 100 mg by mouth daily.     amoxicillin-clavulanate (AUGMENTIN) 875-125 MG tablet Take 1 tablet by mouth 2 (two) times daily.     doxycycline (ADOXA) 100 MG tablet Take 100 mg by mouth 2 (two) times daily.     FLUoxetine (PROZAC) 20 MG capsule Take 1 capsule (20 mg total) by mouth daily. (Patient taking differently: Take 20 mg by mouth at bedtime.) 90 capsule 3   furosemide (LASIX) 40 MG tablet Take 1 tablet daily for 6 days, take as needed after that for swelling or weight gain 30 tablet 0    amoxicillin-clavulanate (AUGMENTIN) 875-125 MG tablet Take 1 tablet by mouth every 12 (twelve) hours for 23 days. 46 tablet 0   doxycycline (VIBRA-TABS) 100 MG tablet Take 1 tablet (100 mg total) by mouth every 12 (twelve) hours for 23 days. 46 tablet 0   No facility-administered medications prior to visit.     Past Medical History:  Diagnosis Date   Diabetes mellitus without complication (HCC)    Dyspnea    History of cervical fracture    Hypertension    Paralysis (HCC)    BLE     Past Surgical History:  Procedure Laterality Date   AMPUTATION Left 08/10/2021   Procedure: LEFT 5TH RAY AMPUTATION;  Surgeon: [name], MD;  Location: [facility] OR;  Service: Orthopedics;  Laterality: Left;   CERVICAL SPINE SURGERY     INCISION AND DRAINAGE OF WOUND N/A 07/06/2022   Procedure: IRRIGATION AND DEBRIDEMENT SACRAL WOUND;  Surgeon: Violeta Gelinas, MD;  Location: [facility] OR;  Service: General;  Laterality: N/A;       Review of Systems  Constitutional:  Negative for chills, diaphoresis, fatigue and fever.  Respiratory:  Negative for cough, chest tightness, shortness of breath and wheezing.   Cardiovascular:  Negative for chest pain.  Gastrointestinal:  Negative for abdominal pain, diarrhea, nausea and vomiting.      Objective:    BP 104/71   Pulse 67   Ht 5' 7.9" (1.725 m)   Wt 280 lb (127 kg) Comment: wheelchair  SpO2 97%   BMI 42.70 kg/m  Nursing note and vital signs reviewed.  Physical Exam Constitutional:      General: He is not in acute distress.    Appearance: He is well-developed.  Cardiovascular:     Rate and Rhythm: Normal rate and regular rhythm.     Heart sounds: Normal heart sounds.  Pulmonary:     Effort: Pulmonary effort is normal.     Breath sounds: Normal breath sounds.  Skin:    General: Skin is warm and dry.  Neurological:     Mental Status: He is alert and oriented to person, place, and time.  Psychiatric:        Mood and Affect: Mood normal.          08/02/2022   10:22 AM 03/01/2022   10:07 AM 07/20/2021    9:52 AM 10/02/2020    1:36 PM 07/28/2020   11:08 AM  Depression screen PHQ 2/9  Decreased Interest 0 0 0 0 0  Down, Depressed, Hopeless 0 0 0 0 0  PHQ - 2 Score 0 0 0 0 0       Assessment & Plan:    Patient Active Problem List   Diagnosis Date Noted   Paraplegia (HCC) 07/07/2022  Cellulitis, gluteal, left 07/06/2022   Hypertension 02/28/2022   Pressure injury of skin 08/07/2021   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)    Acute cystitis with hematuria    Acute osteomyelitis (HCC) 08/06/2021   Chronic pain due to trauma 01/01/2021   DM2 (diabetes mellitus, type 2) (HCC) 04/26/2020   History of DVT (deep vein thrombosis) 04/26/2020   UTI due to extended-spectrum beta lactamase (ESBL) producing Escherichia coli 04/26/2020   At risk for unstable body temperature 11/26/2019   Wheelchair dependence 11/26/2019   Sepsis due to cellulitis (HCC) 09/22/2019   Hypokalemia 09/22/2019   Spasticity 03/08/2019   Erectile dysfunction due to diseases classified elsewhere 03/08/2019   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/22/2019   Neurogenic bowel 01/22/2019   Neurogenic bladder 01/22/2019   Myofascial pain 01/22/2019   Spinal cord injury at T1-T6 level (HCC) 12/30/2018   Quadriplegia (HCC) 12/30/2018     Problem List Items Addressed This Visit       Other   Cellulitis, gluteal, left - Primary    Mr. Polzin has made significant positive progress and wound appears without any clear evidence of infection. Discussed plan of care to stop antibiotics at the completion of his current supply and continue with wound care. Check lab work. Continue to optimize turning and offloading and protein intake. No additional follow up with ID needed at this time pending lab work results or symptom worsening.       Relevant Orders   Sedimentation rate   C-reactive protein   CBC     I have discontinued Ayaz Normington's  amoxicillin-clavulanate, doxycycline, amoxicillin-clavulanate, and doxycycline. I am also having him maintain his acetaminophen, blood glucose meter kit and supplies, BISACODYL LAXATIVE RE, Carboxymethylcellulose Sodium (ARTIFICIAL TEARS OP), furosemide, baclofen, Lancets, Precision Xtra, FREESTYLE TEST STRIPS, insulin glargine, Insulin Syringe-Needle U-100, lisinopril, apixaban, glipiZIDE, sitaGLIPtin, HYDROcodone-acetaminophen, gabapentin, sildenafil, FLUoxetine, senna, ondansetron, docusate sodium, and Dexcom G7 Sensor.   Follow-up: Return if symptoms worsen or fail to improve.   Marcos Eke, MSN, FNP-C Nurse Practitioner [facility] Main number: [phone]

## 2022-08-02 NOTE — Assessment & Plan Note (Addendum)
[name] has made significant positive progress and wound appears without any clear evidence of infection. Discussed plan of care to stop antibiotics at the completion of his current supply and continue with wound care. Check lab work. Continue to optimize turning and offloading and protein intake. No additional follow up with ID needed at this time pending lab work results or symptom worsening.

## 2022-08-02 NOTE — Patient Instructions (Signed)
Nice to see you.  We will check your lab work today.  Complete your medication as prescribed.  Continue wound care.  Optimize turning and nutrition with lots of protein  Follow up with ID as needed.   Have a great day and stay safe!

## 2022-08-03 LAB — CBC
HCT: 34.4 % — ABNORMAL LOW (ref 38.5–50.0)
Hemoglobin: 11.2 g/dL — ABNORMAL LOW (ref 13.2–17.1)
MCHC: 32.6 g/dL (ref 32.0–36.0)
Platelets: 252 10*3/uL (ref 140–400)
RDW: 13.9 % (ref 11.0–15.0)
WBC: 5.6 10*3/uL (ref 3.8–10.8)

## 2022-08-03 LAB — C-REACTIVE PROTEIN: CRP: 13.5 mg/L — ABNORMAL HIGH (ref <8.0)

## 2022-08-05 ENCOUNTER — Telehealth: Payer: Self-pay

## 2022-08-05 ENCOUNTER — Other Ambulatory Visit: Payer: Self-pay | Admitting: Family

## 2022-08-05 MED ORDER — AMOXICILLIN-POT CLAVULANATE 875-125 MG PO TABS: 1.0000 | ORAL_TABLET | Freq: Two times a day (BID) | ORAL | 0 refills | Status: DC

## 2022-08-05 MED ORDER — DOXYCYCLINE HYCLATE 100 MG PO TABS: 100.0000 mg | ORAL_TABLET | Freq: Two times a day (BID) | ORAL | 0 refills | Status: DC

## 2022-08-05 NOTE — Telephone Encounter (Signed)
Called patient, his wife [name] [facility]) answered. Relayed that [name]'s inflammatory markers remain elevated and that [name] has sent in an additional 2 weeks of doxycycline and Augmentin for him to take. Reminded her to have [name] continue with wound care. [name] verbalized understanding and has no further questions.   [employer]: [phone]  Sandie Ano, RN

## 2022-08-05 NOTE — Telephone Encounter (Signed)
-----   Message from Veryl Speak, FNP sent at 08/05/2022  3:06 PM EDT ----- Please inform [name] that his lab work shows his inflammatory markers are elevated. I am going to send an additional 2 weeks of antibiotics for him to take. Continue with wound care.

## 2022-08-08 ENCOUNTER — Encounter (HOSPITAL_BASED_OUTPATIENT_CLINIC_OR_DEPARTMENT_OTHER): Payer: 59 | Admitting: Internal Medicine

## 2022-08-08 DIAGNOSIS — G8221 Paraplegia, complete: Secondary | ICD-10-CM

## 2022-08-08 DIAGNOSIS — L89154 Pressure ulcer of sacral region, stage 4: Secondary | ICD-10-CM

## 2022-08-08 DIAGNOSIS — E11622 Type 2 diabetes mellitus with other skin ulcer: Secondary | ICD-10-CM

## 2022-08-09 ENCOUNTER — Telehealth: Payer: Self-pay

## 2022-08-09 NOTE — Progress Notes (Signed)
NIKAN, RATHGEB ([id number]) [id number] Page 1 of 9 Visit Report for 08/08/2022 Arrival Information Details Patient Name: Date of Service: [name], [place] 08/08/2022 8:00 A M Medical Record Number: [id number] Patient Account Number: [id number] Date of Birth/Sex: Treating RN: [birth year] (57 y.o. [name] Primary Care Rye Dorado: SYSTEM, PRO Rodena Goldmann Other Clinician: Referring Diedra Sinor: Treating Kaileen Bronkema/Extender: Dorie Rank Weeks in Treatment: 2 Visit Information History Since Last Visit Added or deleted any medications: No Patient Arrived: Wheel Chair Any new allergies or adverse reactions: No Arrival Time: 08:19 Has Dressing in Place as Prescribed: Yes Transfer Assistance: Hoyer Lift Pain Present Now: No Patient Identification Verified: Yes Secondary Verification Process Completed: Yes Patient Requires Transmission-Based Precautions: No Patient Has Alerts: No Electronic Signature(s) Signed: 08/09/2022 1:07:25 PM By: Midge Aver MSN RN CNS WTA Entered By: Midge Aver on 08/08/2022 11:33:19 -------------------------------------------------------------------------------- Clinic Level of Care Assessment Details Patient Name: Date of Service: [name] 08/08/2022 8:00 A M Medical Record Number: [id number] Patient Account Number: [id number] Date of Birth/Sex: Treating RN: May 26, 1965 (57 y.o. [name] Primary Care Baylei Siebels: SYSTEM, PRO V IDER Other Clinician: Referring Keevon Henney: Treating Harlyn Rathmann/Extender: Dorie Rank Weeks in Treatment: 2 Clinic Level of Care Assessment Items TOOL 4 Quantity Score X- 1 0 Use when only an EandM is performed on FOLLOW-UP visit ASSESSMENTS - Nursing Assessment / Reassessment X- 1 10 Reassessment of Co-morbidities (includes updates in patient status) X- 1 5 Reassessment of Adherence to Treatment Plan ASSESSMENTS - Wound and Skin A ssessment /  Reassessment X - Simple Wound Assessment / Reassessment - one wound 1 5 []  - 0 Complex Wound Assessment / Reassessment - multiple wounds []  - 0 Dermatologic / Skin Assessment (not related to wound area) ASSESSMENTS - Focused Assessment []  - 0 Circumferential Edema Measurements - multi extremities []  - 0 Nutritional Assessment / Counseling / Intervention []  - 0 Lower Extremity Assessment (monofilament, tuning fork, pulses) []  - 0 Peripheral Arterial Disease Assessment (using hand held doppler) ASSESSMENTS - Ostomy and/or Continence Assessment and Care []  - 0 Incontinence Assessment and Management [name], [name] ([id number]) [id number] Page 2 of 9 []  - 0 Ostomy Care Assessment and Management (repouching, etc.) PROCESS - Coordination of Care X - Simple Patient / Family Education for ongoing care 1 15 []  - 0 Complex (extensive) Patient / Family Education for ongoing care X- 1 10 Staff obtains Chiropractor, Records, T Results / Process Orders est []  - 0 Staff telephones HHA, Nursing Homes / Clarify orders / etc []  - 0 Routine Transfer to another Facility (non-emergent condition) []  - 0 Routine Hospital Admission (non-emergent condition) []  - 0 New Admissions / Manufacturing engineer / Ordering NPWT Apligraf, etc. , []  - 0 Emergency Hospital Admission (emergent condition) X- 1 10 Simple Discharge Coordination []  - 0 Complex (extensive) Discharge Coordination PROCESS - Special Needs []  - 0 Pediatric / Minor Patient Management []  - 0 Isolation Patient Management []  - 0 Hearing / Language / Visual special needs []  - 0 Assessment of Community assistance (transportation, D/C planning, etc.) []  - 0 Additional assistance / Altered mentation []  - 0 Support Surface(s) Assessment (bed, cushion, seat, etc.) INTERVENTIONS - Wound Cleansing / Measurement X - Simple Wound Cleansing - one wound 1 5 []  - 0 Complex Wound Cleansing - multiple wounds X- 1  5 Wound Imaging (photographs - any number of wounds) []  - 0 Wound Tracing (instead of photographs) X- 1 5 Simple Wound Measurement -  one wound []  - 0 Complex Wound Measurement - multiple wounds INTERVENTIONS - Wound Dressings []  - 0 Small Wound Dressing one or multiple wounds []  - 0 Medium Wound Dressing one or multiple wounds X- 1 20 Large Wound Dressing one or multiple wounds []  - 0 Application of Medications - topical []  - 0 Application of Medications - injection INTERVENTIONS - Miscellaneous []  - 0 External ear exam []  - 0 Specimen Collection (cultures, biopsies, blood, body fluids, etc.) []  - 0 Specimen(s) / Culture(s) sent or taken to Lab for analysis []  - 0 Patient Transfer (multiple staff / Nurse, adult / Similar devices) []  - 0 Simple Staple / Suture removal (25 or less) []  - 0 Complex Staple / Suture removal (26 or more) []  - 0 Hypo / Hyperglycemic Management (close monitor of Blood Glucose) []  - 0 Ankle / Brachial Index (ABI) - do not check if billed separately X- 1 5 Vital Signs Has the patient been seen at the hospital within the last three years: Yes Total Score: 95 Level Of Care: New/Established - Level 3 [name], [name] ([id number]) [id number] Page 3 of 9 Electronic Signature(s) Signed: 08/09/2022 1:07:25 PM By: Midge Aver MSN RN CNS WTA Entered By: Midge Aver on 08/08/2022 11:34:55 -------------------------------------------------------------------------------- Encounter Discharge Information Details Patient Name: Date of Service: [name], [name] 08/08/2022 8:00 A M Medical Record Number: [id number] Patient Account Number: [id number] Date of Birth/Sex: Treating RN: Nov 05, 1965 (57 y.o. [name] Primary Care Allen Egerton: SYSTEM, PRO Rodena Goldmann Other Clinician: Referring Jizelle Conkey: Treating Amar Keenum/Extender: Dorie Rank Weeks in Treatment: 2 Encounter Discharge Information Items Discharge  Condition: Stable Ambulatory Status: Wheelchair Discharge Destination: Home Transportation: Private Auto Accompanied By: wife Schedule Follow-up Appointment: Yes Clinical Summary of Care: Electronic Signature(s) Signed: 08/08/2022 11:35:37 AM By: Midge Aver MSN RN CNS WTA Previous Signature: 08/08/2022 11:18:01 AM Version By: Midge Aver MSN RN CNS WTA Entered By: Midge Aver on 08/08/2022 11:35:37 -------------------------------------------------------------------------------- Lower Extremity Assessment Details Patient Name: Date of Service: [name], [name] 08/08/2022 8:00 A M Medical Record Number: [id number] Patient Account Number: [id number] Date of Birth/Sex: Treating RN: [birth year] (57 y.o. [name] Primary Care Kareemah Grounds: SYSTEM, PRO V IDER Other Clinician: Referring Joshawa Dubin: Treating Shelbylynn Walczyk/Extender: Dorie Rank Weeks in Treatment: 2 Electronic Signature(s) Signed: 08/08/2022 11:33:51 AM By: Midge Aver MSN RN CNS WTA Entered By: Midge Aver on 08/08/2022 11:33:51 -------------------------------------------------------------------------------- Multi Wound Chart Details Patient Name: Date of Service: [name], [name] 08/08/2022 8:00 A M Medical Record Number: [id number] Patient Account Number: [id number] Date of Birth/Sex: Treating RN: 04-07-1965 (57 y.o. M) Primary Care Hollan Philipp: SYSTEM, PRO V IDER Other Clinician: Referring Thorsten Climer: Treating Jodi Kappes/Extender: Dorie Rank Weeks in Treatment: 2 [name] ([id number]) [id number] Page 4 of 9 Vital Signs Height(in): Pulse(bpm): 76 Weight(lbs): Blood Pressure(mmHg): 116/75 Body Mass Index(BMI): Temperature(F): 98.0 Respiratory Rate(breaths/min): 18 [7:Photos:] [N/A:N/A] Sacrum N/A N/A Wound Location: Pressure Injury N/A N/A Wounding Event: Pressure Ulcer N/A N/A Primary Etiology: Hypotension, Type II Diabetes, N/A  N/A Comorbid History: Osteomyelitis, Paraplegia 07/12/2021 N/A N/A Date Acquired: 2 N/A N/A Weeks of Treatment: Open N/A N/A Wound Status: No N/A N/A Wound Recurrence: 8x8x4 N/A N/A Measurements L x W x D (cm) 50.265 N/A N/A A (cm) : rea 201.062 N/A N/A Volume (cm) : 28.30% N/A N/A % Reduction in A rea: 13.10% N/A N/A % Reduction in Volume: Category/Stage IV N/A N/A Classification: Large N/A N/A Exudate A mount:  Serosanguineous N/A N/A Exudate Type: red, brown N/A N/A Exudate Color: Large (67-100%) N/A N/A Granulation A mount: Red N/A N/A Granulation Quality: Small (1-33%) N/A N/A Necrotic A mount: Muscle: Yes N/A N/A Exposed Structures: Fascia: No Fat Layer (Subcutaneous Tissue): No Tendon: No Joint: No Bone: No None N/A N/A Epithelialization: Excoriation: No N/A N/A Periwound Skin Texture: Induration: No Callus: No Crepitus: No Rash: No Scarring: No Maceration: No N/A N/A Periwound Skin Moisture: Dry/Scaly: No Atrophie Blanche: No N/A N/A Periwound Skin Color: Cyanosis: No Ecchymosis: No Erythema: No Hemosiderin Staining: No Mottled: No Pallor: No Rubor: No No Abnormality N/A N/A Temperature: Treatment Notes Wound #7 (Sacrum) Cleanser Soap and Water Discharge Instruction: May shower and wash wound with dial antibacterial soap and water prior to dressing change. Wound Cleanser Discharge Instruction: Cleanse the wound with wound cleanser prior to applying a clean dressing using gauze sponges, not tissue or cotton balls. Peri-Wound Care Topical Primary Dressing Dakin's Solution 0.25%, 16 (oz) Discharge Instruction: Moisten gauze with Dakin's solution [name], [name] ([id number]) [id number] Page 5 of 9 Secondary Dressing Woven Gauze Sponge, Non-Sterile 4x4 in Discharge Instruction: Apply over primary dressing as directed. Zetuvit Plus Silicone Border Sacrum Dressing, Sm, 7x7 (in/in) Discharge Instruction: Apply  silicone border over primary dressing as directed. Secured With 64M Medipore H Soft Cloth Surgical T ape, 4 x 10 (in/yd) Discharge Instruction: Secure with tape as directed. Compression Wrap Compression Stockings Add-Ons Electronic Signature(s) Signed: 08/08/2022 4:14:51 PM By: Geralyn Corwin DO Entered By: Geralyn Corwin on 08/08/2022 08:54:10 -------------------------------------------------------------------------------- Multi-Disciplinary Care Plan Details Patient Name: Date of Service: [name], [name] 08/08/2022 8:00 A M Medical Record Number: [id number] Patient Account Number: [id number] Date of Birth/Sex: Treating RN: 07-Jul-1965 (57 y.o. M) Primary Care Avory Rahimi: SYSTEM, PRO V IDER Other Clinician: Referring Lytle Malburg: Treating Rylan Bernard/Extender: Yevonne Aline, KELLY Weeks in Treatment: 2 Active Inactive Pressure Nursing Diagnoses: Knowledge deficit related to causes and risk factors for pressure ulcer development Knowledge deficit related to management of pressures ulcers Goals: Patient will remain free from development of additional pressure ulcers Date Initiated: 07/25/2022 Target Resolution Date: 09/19/2022 Goal Status: Active Patient will remain free of pressure ulcers Date Initiated: 07/25/2022 Target Resolution Date: 09/19/2022 Goal Status: Active Patient/caregiver will verbalize risk factors for pressure ulcer development Date Initiated: 07/25/2022 Target Resolution Date: 08/08/2022 Goal Status: Active Patient/caregiver will verbalize understanding of pressure ulcer management Date Initiated: 07/25/2022 Target Resolution Date: 08/29/2022 Goal Status: Active Interventions: Assess: immobility, friction, shearing, incontinence upon admission and as needed Assess offloading mechanisms upon admission and as needed Assess potential for pressure ulcer upon admission and as needed Provide education on pressure ulcers Notes: Wound/Skin Impairment Nursing  Diagnoses: Impaired tissue integrity [name], [name] ([id number]) [id number] Page 6 of 9 Knowledge deficit related to ulceration/compromised skin integrity Goals: Patient will have a decrease in wound volume by X% from date: (specify in notes) Date Initiated: 07/25/2022 Target Resolution Date: 10/17/2022 Goal Status: Active Patient/caregiver will verbalize understanding of skin care regimen Date Initiated: 07/25/2022 Target Resolution Date: 08/22/2022 Goal Status: Active Ulcer/skin breakdown will have a volume reduction of 30% by week 4 Date Initiated: 07/25/2022 Target Resolution Date: 08/22/2022 Goal Status: Active Interventions: Assess patient/caregiver ability to obtain necessary supplies Assess patient/caregiver ability to perform ulcer/skin care regimen upon admission and as needed Assess ulceration(s) every visit Provide education on ulcer and skin care Notes: Electronic Signature(s) Signed: 08/08/2022 11:34:24 AM By: Midge Aver MSN RN CNS WTA Previous Signature: 08/08/2022 11:17:03 AM Version By: Midge Aver MSN  RN CNS WTA Entered By: Midge Aver on 08/08/2022 11:34:24 -------------------------------------------------------------------------------- Pain Assessment Details Patient Name: Date of Service: [name] 08/08/2022 8:00 A M Medical Record Number: [id number] Patient Account Number: [id number] Date of Birth/Sex: Treating RN: 1965/05/23 (57 y.o. [name] Primary Care Cortlan Dolin: SYSTEM, PRO Rodena Goldmann Other Clinician: Referring Dru Primeau: Treating Naresh Althaus/Extender: Dorie Rank Weeks in Treatment: 2 Active Problems Location of Pain Severity and Description of Pain Patient Has Paino Yes Site Locations Rate the pain. Current Pain Level: 5 Pain Management and Medication Current Pain Management: Electronic Signature(s) Signed: 08/08/2022 11:33:43 AM By: Midge Aver MSN RN CNS WTA Entered By: Midge Aver on  08/08/2022 11:33:42 [name] ([id number]) [id number] Page 7 of 9 -------------------------------------------------------------------------------- Patient/Caregiver Education Details Patient Name: Date of Service: [name] 6/27/2024andnbsp8:00 A M Medical Record Number: [id number] Patient Account Number: [id number] Date of Birth/Gender: Treating RN: [birth year] (57 y.o. [name] Primary Care Physician: SYSTEM, PRO V IDER Other Clinician: Referring Physician: Treating Physician/Extender: Dorie Rank Weeks in Treatment: 2 Education Assessment Education Provided To: Patient and Caregiver Education Topics Provided Wound/Skin Impairment: Handouts: Caring for Your Ulcer Spanish Methods: Explain/Verbal Responses: State content correctly Electronic Signature(s) Signed: 08/09/2022 1:07:25 PM By: Midge Aver MSN RN CNS WTA Entered By: Midge Aver on 08/08/2022 11:34:43 -------------------------------------------------------------------------------- Wound Assessment Details Patient Name: Date of Service: [name], [name] 08/08/2022 8:00 A M Medical Record Number: [id number] Patient Account Number: [id number] Date of Birth/Sex: Treating RN: 31-Jan-1966 (57 y.o. M) Primary Care Tephanie Escorcia: SYSTEM, PRO V IDER Other Clinician: Referring Brytni Dray: Treating Nadezhda Pollitt/Extender: Yevonne Aline, KELLY Weeks in Treatment: 2 Wound Status Wound Number: 7 Primary Etiology: Pressure Ulcer Wound Location: Sacrum Wound Status: Open Wounding Event: Pressure Injury Comorbid History: Hypotension, Type II Diabetes, Osteomyelitis, Paraplegia Date Acquired: 07/12/2021 Weeks Of Treatment: 2 Clustered Wound: No Photos Wound Measurements [name], [name] ([id number]) Length: (cm) 8 Width: (cm) 8 Depth: (cm) 4 Area: (cm) 50.265 Volume: (cm) 201.062 [id number] Page 8 of 9 % Reduction in Area:  28.3% % Reduction in Volume: 13.1% Epithelialization: None Wound Description Classification: Category/Stage IV Exudate Amount: Large Exudate Type: Serosanguineous Exudate Color: red, brown Foul Odor After Cleansing: No Slough/Fibrino Yes Wound Bed Granulation Amount: Large (67-100%) Exposed Structure Granulation Quality: Red Fascia Exposed: No Necrotic Amount: Small (1-33%) Fat Layer (Subcutaneous Tissue) Exposed: No Necrotic Quality: Adherent Slough Tendon Exposed: No Muscle Exposed: Yes Necrosis of Muscle: No Joint Exposed: No Bone Exposed: No Periwound Skin Texture Texture Color No Abnormalities Noted: No No Abnormalities Noted: No Callus: No Atrophie Blanche: No Crepitus: No Cyanosis: No Excoriation: No Ecchymosis: No Induration: No Erythema: No Rash: No Hemosiderin Staining: No Scarring: No Mottled: No Pallor: No Moisture Rubor: No No Abnormalities Noted: No Dry / Scaly: No Temperature / Pain Maceration: No Temperature: No Abnormality Treatment Notes Wound #7 (Sacrum) Cleanser Soap and Water Discharge Instruction: May shower and wash wound with dial antibacterial soap and water prior to dressing change. Wound Cleanser Discharge Instruction: Cleanse the wound with wound cleanser prior to applying a clean dressing using gauze sponges, not tissue or cotton balls. Peri-Wound Care Topical Primary Dressing Dakin's Solution 0.25%, 16 (oz) Discharge Instruction: Moisten gauze with Dakin's solution Secondary Dressing Woven Gauze Sponge, Non-Sterile 4x4 in Discharge Instruction: Apply over primary dressing as directed. Zetuvit Plus Silicone Border Sacrum Dressing, Sm, 7x7 (in/in) Discharge Instruction: Apply silicone border over primary dressing as directed. Secured With [employer]  Soft Cloth Surgical T ape, 4 x 10 (in/yd) Discharge Instruction: Secure with tape as directed. Compression Wrap Compression Stockings Add-Ons Electronic  Signature(s) Signed: 08/09/2022 1:07:25 PM By: Midge Aver MSN RN CNS WTA Entered By: Midge Aver on 08/08/2022 08:43:58 [name] ([id number]) [id number] Page 9 of 9 -------------------------------------------------------------------------------- Vitals Details Patient Name: Date of Service: [name] 08/08/2022 8:00 A M Medical Record Number: [id number] Patient Account Number: [id number] Date of Birth/Sex: Treating RN: 05/21/1965 (57 y.o. [name] Primary Care Mareon Robinette: SYSTEM, PRO V IDER Other Clinician: Referring Lexia Vandevender: Treating Alice Burnside/Extender: Yevonne Aline, KELLY Weeks in Treatment: 2 Vital Signs Time Taken: 08:27 Temperature (F): 98.0 Pulse (bpm): 76 Respiratory Rate (breaths/min): 18 Blood Pressure (mmHg): 116/75 Reference Range: 80 - 120 mg / dl Electronic Signature(s) Signed: 08/09/2022 1:07:25 PM By: Midge Aver MSN RN CNS WTA Entered By: Midge Aver on 08/08/2022 11:33:27

## 2022-08-09 NOTE — Progress Notes (Signed)
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 He visited the ED on 6/24 and was admitted for sepsis secondary to sacral wound infection. He had a CT abdomen pelvis that did not show evidence of osteomyelitis but did suggest infection of the subcutaneous tissue on 5/25 he had irrigation and debridement of the sacral wound by Dr. Janee Morn with general surgery. He has been using saline wet-to-dry dressings since the surgery. Infectious disease recommended 4 weeks of oral antibiotics including Augmentin and doxycycline. While in the hospital he was on IV antibiotics for about 1 week. He has follow-up with infectious disease on 6/21. He was given an air mattress post discharge. He has home health that comes in weekly to help with assessment and dressing changes. Currently patient denies systemic signs of infection. 6/27; patient presents for follow-up. Patient's been using Dakin's  wet-to-dry dressings and trying to offload the wound bed as best he can. He saw infectious disease on 6/21 and plan is for 2 more weeks of Augmentin and is to follow-up with them as  needed. Wound has healthier granulation tissue present today. Overall smaller. Patient History Information obtained from Patient. Family History Diabetes - Mother,Father,Siblings, Hypertension - Mother,Father, No family history of Cancer, Heart Disease, Hereditary Spherocytosis, Kidney Disease, Lung Disease, Seizures, Stroke, Thyroid Problems. Social History Never smoker, Marital Status - Married, Alcohol Use - Rarely, Drug Use - No History, Caffeine Use - Daily. Medical History Eyes Denies history of Cataracts, Glaucoma, Optic Neuritis Ear/Nose/Mouth/Throat WALLIE, GUY ([id number]) [id number] Page 6 of 9 Denies history of Chronic sinus problems/congestion, Middle ear problems Hematologic/Lymphatic Denies history of Anemia, Hemophilia, Human Immunodeficiency Virus, Lymphedema, Sickle Cell Disease Respiratory Denies history of Aspiration, Asthma, Chronic Obstructive Pulmonary Disease (COPD), Pneumothorax, Sleep Apnea, Tuberculosis Cardiovascular Patient has history of Hypotension Denies history of Angina, Arrhythmia, Congestive Heart Failure, Coronary Artery Disease, Deep Vein Thrombosis, Hypertension, Myocardial Infarction, Peripheral Arterial Disease, Peripheral Venous Disease, Phlebitis, Vasculitis Gastrointestinal Denies history of Cirrhosis , Colitis, Crohns, Hepatitis A, Hepatitis B, Hepatitis C Endocrine Patient has history of Type II Diabetes - HgbA1c 8.1 07/06/2022 Denies history of Type I Diabetes Genitourinary Denies history of End Stage Renal Disease Immunological Denies history of Lupus Erythematosus, Raynauds, Scleroderma Integumentary (Skin) Denies history of History of Burn Musculoskeletal Patient has history of Osteomyelitis - left foot 5th toe Denies  history of Gout, Rheumatoid Arthritis, Osteoarthritis Neurologic Patient has history of Paraplegia Denies history of Dementia, Neuropathy, Quadriplegia, Seizure Disorder Oncologic Denies history of Received Chemotherapy, Received Radiation Psychiatric Denies history of Anorexia/bulimia, Confinement Anxiety Hospitalization/Surgery History - 07/06/2022 inpatient IandD general surgery; sepsis. 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 - cervical spine surgery. Medical A Surgical History Notes nd Cardiovascular IVF filter Genitourinary neurogenic bowel Neurologic spinal cord injury T1-T6 Objective Constitutional respirations regular, non-labored and within target range for patient.. Vitals Time Taken: 8:27 AM, Temperature: 98.0 F, Pulse: 76 bpm, Respiratory Rate: 18 breaths/min, Blood Pressure: 116/75 mmHg. Psychiatric pleasant and cooperative. General Notes: Sacrum: Large open wound with increased depth. Granulation tissue throughout With a few scattered areas that have some nonviable tissue Not all areas appear healthy however an improvement from last visit. No obvious infection including increased warmth, erythema or purulent drainage. Integumentary (Hair, Skin) Wound #7 status is Open. Original cause of wound was Pressure Injury. The date acquired was: 07/12/2021. The wound has been in treatment 2 weeks. The wound is located on the Sacrum. The wound measures 8cm length x 8cm width x 4cm depth; 50.265cm^2 area and 201.062cm^3 volume. There is muscle exposed. There is a large amount of serosanguineous drainage noted. There is large (67-100%) red granulation within the wound bed. There is a small (1-33%) amount of necrotic tissue within the wound bed including Adherent Slough. The periwound skin appearance did not exhibit: Callus, Crepitus, Excoriation, Induration, Rash, Scarring, Dry/Scaly, Maceration, Atrophie Blanche, Cyanosis, Ecchymosis, Hemosiderin Staining, Mottled, Pallor, Rubor,  Erythema. Periwound temperature was noted as No Abnormality. Assessment Active Problems ICD-10 Pressure ulcer of sacral region, stage 4 Type 2 diabetes mellitus with other skin ulcer Paraplegia, complete JARTAVIOUS, DAVYDOV ([id number]) [id number] Page 7 of 9 Patient's wound has improved in size in appearance since last clinic visit. I recommended continuing Dakin's wet-to-dry dressings and aggressive offloading. He has completed 4 weeks of antibiotics and has been prescribed an additional 2 weeks of Augmentin by ID. I will see him back in 2 weeks and we will order the wound VAC at that time. Plan Follow-up Appointments: Return Appointment in 2 weeks. - Dr Mikey Bussing Bathing/ Shower/ Hygiene: May shower and wash wound with soap and water. - Dial  gold antibacterial soap ( liquid) Off-Loading: Wound #7 Sacrum: Low air-loss mattress (Group 2) Roho cushion for wheelchair Turn and reposition every 2 hours Home Health: Wound #7 Sacrum: New wound care orders this week; continue Home Health for wound care. May utilize formulary equivalent dressing for wound treatment orders unless otherwise specified. - Home Health to see pt 1xweek. Pack wound with Dakins wet to dry gauze, cover wound with Zetuvit boarder foam, may secure with cloth tape if needed WOUND #7: - Sacrum Wound Laterality: Cleanser: Soap and Water 1 x Per Day/15 Days Discharge Instructions: May shower and wash wound with dial antibacterial soap and water prior to dressing change. Cleanser: Wound Cleanser 1 x Per Day/15 Days Discharge Instructions: Cleanse the wound with wound cleanser prior to applying a clean dressing using gauze sponges, not tissue or cotton balls. Prim Dressing: Dakin's Solution 0.25%, 16 (oz) 1 x Per Day/15 Days ary Discharge Instructions: Moisten gauze with Dakin's solution Secondary Dressing: Woven Gauze Sponge, Non-Sterile 4x4 in (Generic) 1 x Per Day/15 Days Discharge Instructions:  Apply over primary dressing as directed. Secondary Dressing: Zetuvit Plus Silicone Border Sacrum Dressing, Sm, 7x7 (in/in) (Generic) 1 x Per Day/15 Days Discharge Instructions: Apply silicone border over primary dressing as directed. Secured With: 61M Medipore H Soft Cloth Surgical T ape, 4 x 10 (in/yd) (Generic) 1 x Per Day/15 Days Discharge Instructions: Secure with tape as directed. 1. Dakin's wet-to-dry dressings 2. Aggressive offloading 3. Follow-up in 2 weeks Electronic Signature(s) Unsigned Previous Signature: 08/08/2022 4:14:51 PM Version By: Geralyn Corwin DO Entered By: Shawn Stall on 08/09/2022 14:06:20 -------------------------------------------------------------------------------- HxROS Details Patient Name: Date of Service: [name], [name] 08/08/2022 8:00 A M Medical Record Number: [id number] Patient Account Number: [id number] Date of Birth/Sex: Treating RN: March 13, 1965 (57 y.o. M) Primary Care Provider: SYSTEM, PRO V IDER Other Clinician: Referring Provider: Treating Provider/Extender: Dorie Rank Weeks in Treatment: 2 Information Obtained From Patient Eyes Medical History: Negative for: Cataracts; Glaucoma; Optic Neuritis Ear/Nose/Mouth/Throat Medical History: Negative for: Chronic sinus problems/congestion; Middle ear problems Hematologic/Lymphatic Medical HistoryHAZEL, JOSWICK ([id number]) [id number] Page 8 of 9 Negative for: Anemia; Hemophilia; Human Immunodeficiency Virus; Lymphedema; Sickle Cell Disease Respiratory Medical History: Negative for: Aspiration; Asthma; Chronic Obstructive Pulmonary Disease (COPD); Pneumothorax; Sleep Apnea; Tuberculosis Cardiovascular Medical History: Positive for: Hypotension Negative for: Angina; Arrhythmia; Congestive Heart Failure; Coronary Artery Disease; Deep Vein Thrombosis; Hypertension; Myocardial Infarction; Peripheral Arterial Disease; Peripheral Venous  Disease; Phlebitis; Vasculitis Past Medical History Notes: IVF filter Gastrointestinal Medical History: Negative for: Cirrhosis ; Colitis; Crohns; Hepatitis A; Hepatitis B; Hepatitis C Endocrine Medical History: Positive for: Type II Diabetes - HgbA1c 8.1 07/06/2022 Negative for: Type I Diabetes Time with diabetes: 5 years Treated with: Oral agents Blood sugar tested every day: No Genitourinary Medical History: Negative for: End Stage Renal Disease Past Medical History Notes: neurogenic bowel Immunological Medical History: Negative for: Lupus Erythematosus; Raynauds; Scleroderma Integumentary (Skin) Medical History: Negative for: History of Burn Musculoskeletal Medical History: Positive for: Osteomyelitis - left foot 5th toe Negative for: Gout; Rheumatoid Arthritis; Osteoarthritis Neurologic Medical History: Positive for: Paraplegia Negative for: Dementia; Neuropathy; Quadriplegia; Seizure Disorder Past Medical History Notes: spinal cord injury T1-T6 Oncologic Medical History: Negative for: Received Chemotherapy; Received Radiation Psychiatric Medical History: Negative for: Anorexia/bulimia; Confinement Anxiety Immunizations Pneumococcal Vaccine: Received Pneumococcal Vaccination: No Implantable Devices None LORN, ELEDGE ([id number]) [id number] Page 9 of 9 Hospitalization / Surgery History Type of Hospitalization/Surgery 07/06/2022 inpatient [facility]andD general surgery; sepsis 08/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 cervical spine  surgery Family and Social History Cancer: No; Diabetes: Yes - Mother,Father,Siblings; Heart Disease: No; Hereditary Spherocytosis: No; Hypertension: Yes - Mother,Father; Kidney Disease: No; Lung Disease: No; Seizures: No; Stroke: No; Thyroid Problems: No; Never smoker; Marital Status - Married; Alcohol Use: Rarely; Drug Use: No History; Caffeine Use: Daily; Financial Concerns: No; Food, Clothing or Shelter Needs: No;  Support System Lacking: No; Transportation Concerns: No Electronic Signature(s) Signed: 08/08/2022 4:14:51 PM By: Geralyn Corwin DO Entered By: Geralyn Corwin on 08/08/2022 09:00:16 -------------------------------------------------------------------------------- SuperBill Details Patient Name: Date of Service: [name], [name] 08/08/2022 Medical Record Number: [id number] Patient Account Number: [id number] Date of Birth/Sex: Treating RN: 27-Feb-1965 (57 y.o. M) Primary Care Provider: SYSTEM, PRO V IDER Other Clinician: Referring Provider: Treating Provider/Extender: Yevonne Aline, KELLY Weeks in Treatment: 2 Diagnosis Coding ICD-10 Codes Code Description L89.154 Pressure ulcer of sacral region, stage 4 E11.622 Type 2 diabetes mellitus with other skin ulcer G82.21 Paraplegia, complete Facility Procedures : CPT4 Code: [id number] Description: 9213 - WOUND CARE VISIT-LEV 3 EST PT Modifier: Quantity: 1 Physician Procedures : CPT4 Code Description Modifier [id number] - WC PHYS LEVEL 3 - EST PT ICD-10 Diagnosis Description L89.154 Pressure ulcer of sacral region, stage 4 E11.622 Type 2 diabetes mellitus with other skin ulcer G82.21 Paraplegia, complete Quantity: 1 Electronic Signature(s) Signed: 08/08/2022 11:35:03 AM By: Midge Aver MSN RN CNS WTA Signed: 08/08/2022 4:14:51 PM By: Geralyn Corwin DO Previous Signature: 08/08/2022 11:17:30 AM Version By: Midge Aver MSN RN CNS WTA Entered By: Midge Aver on 08/08/2022 11:35:03

## 2022-08-09 NOTE — Telephone Encounter (Signed)
[name] with [employer] called requesting Harry Eke NP update orders for motorized Wheelchair. After review of chart and with [name] I advised them to contact family medicine PCP (N.Brown) who has done visit related to DME needs. Gave phone and fax numbers to [name] from [employer] who can be reached at  [phone].

## 2022-08-23 ENCOUNTER — Encounter (HOSPITAL_BASED_OUTPATIENT_CLINIC_OR_DEPARTMENT_OTHER): Payer: 59 | Attending: Internal Medicine | Admitting: Internal Medicine

## 2022-08-23 DIAGNOSIS — E1151 Type 2 diabetes mellitus with diabetic peripheral angiopathy without gangrene: Secondary | ICD-10-CM | POA: Diagnosis not present

## 2022-08-23 DIAGNOSIS — Z7901 Long term (current) use of anticoagulants: Secondary | ICD-10-CM | POA: Insufficient documentation

## 2022-08-23 DIAGNOSIS — Z794 Long term (current) use of insulin: Secondary | ICD-10-CM | POA: Diagnosis not present

## 2022-08-23 DIAGNOSIS — I890 Lymphedema, not elsewhere classified: Secondary | ICD-10-CM | POA: Insufficient documentation

## 2022-08-23 DIAGNOSIS — E11622 Type 2 diabetes mellitus with other skin ulcer: Secondary | ICD-10-CM | POA: Diagnosis present

## 2022-08-23 DIAGNOSIS — G8221 Paraplegia, complete: Secondary | ICD-10-CM | POA: Diagnosis not present

## 2022-08-23 DIAGNOSIS — I872 Venous insufficiency (chronic) (peripheral): Secondary | ICD-10-CM | POA: Insufficient documentation

## 2022-08-23 DIAGNOSIS — L89154 Pressure ulcer of sacral region, stage 4: Secondary | ICD-10-CM | POA: Insufficient documentation

## 2022-08-23 NOTE — Progress Notes (Signed)
[name] ([id number]) [id number] Page 1 of 9 Visit Report for 08/23/2022 Arrival Information Details Patient Name: Date of Service: [name], [place] 08/23/2022 8:45 A M Medical Record Number: [id number] Patient Account Number: [id number] Date of Birth/Sex: Treating RN: Nov 03, 1965 (57 y.o. M) Primary Care Alauna Hayden: Gayleen Orem, Zena Amos Other Clinician: Referring Jniyah Dantuono: Treating Zaide Kardell/Extender: Dorie Rank Weeks in Treatment: 4 Visit Information History Since Last Visit Added or deleted any medications: No Patient Arrived: Wheel Chair Any new allergies or adverse reactions: No Arrival Time: 09:00 Had a fall or experienced change in No Accompanied By: self activities of daily living that may affect Transfer Assistance: Michiel Sites Lift risk of falls: Patient Identification Verified: Yes Signs or symptoms of abuse/neglect since last visito No Secondary Verification Process Completed: Yes Hospitalized since last visit: No Patient Requires Transmission-Based Precautions: No Implantable device outside of the clinic excluding No Patient Has Alerts: No cellular tissue based products placed in the center since last visit: Has Dressing in Place as Prescribed: Yes Pain Present Now: No Electronic Signature(s) Signed: 08/23/2022 1:10:34 PM By: Thayer Dallas Entered By: Thayer Dallas on 08/23/2022 09:14:02 -------------------------------------------------------------------------------- Clinic Level of Care Assessment Details Patient Name: Date of Service: [name] 08/23/2022 8:45 A M Medical Record Number: [id number] Patient Account Number: [id number] Date of Birth/Sex: Treating RN: [birth year] (56 y.o. [name] Primary Care Zerick Prevette: Gayleen Orem, Zena Amos Other Clinician: Referring Nicolas Banh: Treating Yolanda Huffstetler/Extender: Dorie Rank Weeks in Treatment: 4 Clinic Level of Care Assessment Items TOOL 4  Quantity Score X- 1 0 Use when only an EandM is performed on FOLLOW-UP visit ASSESSMENTS - Nursing Assessment / Reassessment X- 1 10 Reassessment of Co-morbidities (includes updates in patient status) X- 1 5 Reassessment of Adherence to Treatment Plan ASSESSMENTS - Wound and Skin A ssessment / Reassessment []  - 0 Simple Wound Assessment / Reassessment - one wound X- 1 5 Complex Wound Assessment / Reassessment - multiple wounds X- 1 10 Dermatologic / Skin Assessment (not related to wound area) ASSESSMENTS - Focused Assessment []  - 0 Circumferential Edema Measurements - multi extremities X- 1 10 Nutritional Assessment / Counseling / Intervention DONOLD, SHEFFLER ([id number]) [id number] Page 2 of 9 []  - 0 Lower Extremity Assessment (monofilament, tuning fork, pulses) []  - 0 Peripheral Arterial Disease Assessment (using hand held doppler) ASSESSMENTS - Ostomy and/or Continence Assessment and Care []  - 0 Incontinence Assessment and Management []  - 0 Ostomy Care Assessment and Management (repouching, etc.) PROCESS - Coordination of Care []  - 0 Simple Patient / Family Education for ongoing care X- 1 20 Complex (extensive) Patient / Family Education for ongoing care X- 1 10 Staff obtains Chiropractor, Records, T Results / Process Orders est X- 1 10 Staff telephones HHA, Nursing Homes / Clarify orders / etc []  - 0 Routine Transfer to another Facility (non-emergent condition) []  - 0 Routine Hospital Admission (non-emergent condition) []  - 0 New Admissions / Manufacturing engineer / Ordering NPWT Apligraf, etc. , []  - 0 Emergency Hospital Admission (emergent condition) []  - 0 Simple Discharge Coordination X- 1 15 Complex (extensive) Discharge Coordination PROCESS - Special Needs []  - 0 Pediatric / Minor Patient Management []  - 0 Isolation Patient Management []  - 0 Hearing / Language / Visual special needs []  - 0 Assessment of Community  assistance (transportation, D/C planning, etc.) []  - 0 Additional assistance / Altered mentation X- 1 15 Support Surface(s) Assessment (bed, cushion, seat, etc.) INTERVENTIONS - Wound Cleansing / Measurement []  -  0 Simple Wound Cleansing - one wound X- 1 5 Complex Wound Cleansing - multiple wounds X- 1 5 Wound Imaging (photographs - any number of wounds) []  - 0 Wound Tracing (instead of photographs) []  - 0 Simple Wound Measurement - one wound X- 1 5 Complex Wound Measurement - multiple wounds INTERVENTIONS - Wound Dressings []  - 0 Small Wound Dressing one or multiple wounds X- 1 15 Medium Wound Dressing one or multiple wounds []  - 0 Large Wound Dressing one or multiple wounds []  - 0 Application of Medications - topical []  - 0 Application of Medications - injection INTERVENTIONS - Miscellaneous []  - 0 External ear exam []  - 0 Specimen Collection (cultures, biopsies, blood, body fluids, etc.) []  - 0 Specimen(s) / Culture(s) sent or taken to Lab for analysis X- 1 10 Patient Transfer (multiple staff / Nurse, adult / Similar devices) []  - 0 Simple Staple / Suture removal (25 or less) []  - 0 Complex Staple / Suture removal (26 or more) []  - 0 Hypo / Hyperglycemic Management (close monitor of Blood Glucose) TYRENCE, FANTASIA ([id number]) [id number] Page 3 of 9 []  - 0 Ankle / Brachial Index (ABI) - do not check if billed separately X- 1 5 Vital Signs Has the patient been seen at the hospital within the last three years: Yes Total Score: 155 Level Of Care: New/Established - Level 4 Electronic Signature(s) Signed: 08/23/2022 1:53:12 PM By: Shawn Stall RN, BSN Entered By: Shawn Stall on 08/23/2022 09:34:08 -------------------------------------------------------------------------------- Encounter Discharge Information Details Patient Name: Date of Service: [name], [name] 08/23/2022 8:45 A M Medical Record Number: [id number] Patient Account  Number: [id number] Date of Birth/Sex: Treating RN: May 10, 1965 (57 y.o. [name] Primary Care Beva Remund: Kalman Shan Other Clinician: Referring Jolonda Gomm: Treating Keyia Moretto/Extender: Dorie Rank Weeks in Treatment: 4 Encounter Discharge Information Items Discharge Condition: Stable Ambulatory Status: Wheelchair Discharge Destination: Home Transportation: Private Auto Accompanied By: wife and translator. Schedule Follow-up Appointment: Yes Clinical Summary of Care: Electronic Signature(s) Signed: 08/23/2022 1:53:12 PM By: Shawn Stall RN, BSN Entered By: Shawn Stall on 08/23/2022 09:34:49 -------------------------------------------------------------------------------- Lower Extremity Assessment Details Patient Name: Date of Service: [name],  RLO S 08/23/2022 8:45 A M Medical Record Number: [id number] Patient Account Number: [id number] Date of Birth/Sex: Treating RN: 1965/07/24 (57 y.o. M) Primary Care Ellyse Rotolo: Gayleen Orem, Zena Amos Other Clinician: Referring Mailen Newborn: Treating Dionte Blaustein/Extender: Dorie Rank Weeks in Treatment: 4 Electronic Signature(s) Signed: 08/23/2022 1:10:34 PM By: Thayer Dallas Entered By: Thayer Dallas on 08/23/2022 09:14:48 -------------------------------------------------------------------------------- Multi Wound Chart Details Patient Name: Date of Service: [name], [name] 08/23/2022 8:45 A M Medical Record Number: [id number] Patient Account Number: [id number] ADVITH, JANOTA ([id number]) [id number] Page 4 of 9 Date of Birth/Sex: Treating RN: 1965/09/20 (57 y.o. M) Primary Care Kia Varnadore: Other Clinician: Kalman Shan Referring Johnothan Bascomb: Treating Dinesha Twiggs/Extender: Yevonne Aline, KELLY Weeks in Treatment: 4 Vital Signs Height(in): Pulse(bpm): 71 Weight(lbs): Blood Pressure(mmHg): 92/62 Body Mass Index(BMI): Temperature(F): 98.1 Respiratory  Rate(breaths/min): 20 [7:Photos:] [N/A:N/A] Sacrum N/A N/A Wound Location: Pressure Injury N/A N/A Wounding Event: Pressure Ulcer N/A N/A Primary Etiology: Hypotension, Type II Diabetes, N/A N/A Comorbid History: Osteomyelitis, Paraplegia 07/12/2021 N/A N/A Date Acquired: 4 N/A N/A Weeks of Treatment: Open N/A N/A Wound Status: No N/A N/A Wound Recurrence: 7x4x4 N/A N/A Measurements L x W x D (cm) 21.991 N/A N/A A (cm) : rea 87.965 N/A N/A Volume (cm) : 68.60% N/A N/A % Reduction in A rea:  62.00% N/A N/A % Reduction in Volume: 12 Position 1 (o'clock): 0.8 Maximum Distance 1 (cm): Yes N/A N/A Tunneling: Category/Stage IV N/A N/A Classification: Large N/A N/A Exudate A mount: Serosanguineous N/A N/A Exudate Type: red, brown N/A N/A Exudate Color: Distinct, outline attached N/A N/A Wound Margin: Large (67-100%) N/A N/A Granulation A mount: Red, Friable N/A N/A Granulation Quality: Small (1-33%) N/A N/A Necrotic A mount: Fat Layer (Subcutaneous Tissue): Yes N/A N/A Exposed Structures: Muscle: Yes Bone: Yes Fascia: No Tendon: No Joint: No None N/A N/A Epithelialization: Excoriation: No N/A N/A Periwound Skin Texture: Induration: No Callus: No Crepitus: No Rash: No Scarring: No Maceration: No N/A N/A Periwound Skin Moisture: Dry/Scaly: No Atrophie Blanche: No N/A N/A Periwound Skin Color: Cyanosis: No Ecchymosis: No Erythema: No Hemosiderin Staining: No Mottled: No Pallor: No Rubor: No No Abnormality N/A N/A Temperature: Treatment Notes Wound #7 (Sacrum) Cleanser Soap and Water Discharge Instruction: May shower and wash wound with dial antibacterial soap and water prior to dressing change. WYMAN, FRANTA ([id number]) [id number] Page 5 of 9 Wound Cleanser Discharge Instruction: Cleanse the wound with wound cleanser prior to applying a clean dressing using gauze sponges, not tissue or cotton balls. Peri-Wound  Care Topical Primary Dressing Dakin's Solution 0.25%, 16 (oz) Discharge Instruction: Moisten gauze with Dakin's solution Secondary Dressing Woven Gauze Sponge, Non-Sterile 4x4 in Discharge Instruction: Apply over primary dressing as directed. Zetuvit Plus Silicone Border Sacrum Dressing, Sm, 7x7 (in/in) Discharge Instruction: Apply silicone border over primary dressing as directed. Secured With 104M Medipore H Soft Cloth Surgical T ape, 4 x 10 (in/yd) Discharge Instruction: Secure with tape as directed. Compression Wrap Compression Stockings Add-Ons Electronic Signature(s) Signed: 08/23/2022 9:53:20 AM By: Geralyn Corwin DO Entered By: Geralyn Corwin on 08/23/2022 09:37:49 -------------------------------------------------------------------------------- Multi-Disciplinary Care Plan Details Patient Name: Date of Service: [name], [place] RLO S 08/23/2022 8:45 A M Medical Record Number: [id number] Patient Account Number: [id number] Date of Birth/Sex: Treating RN: 19-May-1965 (57 y.o. [name] Primary Care Sarha Bartelt: Kalman Shan Other Clinician: Referring Shakai Dolley: Treating Sonyia Muro/Extender: Dorie Rank Weeks in Treatment: 4 Active Inactive Pressure Nursing Diagnoses: Knowledge deficit related to causes and risk factors for pressure ulcer development Knowledge deficit related to management of pressures ulcers Goals: Patient will remain free from development of additional pressure ulcers Date Initiated: 07/25/2022 Target Resolution Date: 09/19/2022 Goal Status: Active Patient will remain free of pressure ulcers Date Initiated: 07/25/2022 Target Resolution Date: 09/19/2022 Goal Status: Active Patient/caregiver will verbalize risk factors for pressure ulcer development Date Initiated: 07/25/2022 Target Resolution Date: 08/08/2022 Goal Status: Active Patient/caregiver will verbalize understanding of pressure ulcer management Date Initiated:  07/25/2022 Target Resolution Date: 09/27/2022 Goal Status: Active Interventions: Assess: immobility, friction, shearing, incontinence upon admission and as needed Assess offloading mechanisms upon admission and as needed JODIE, RIGLEY ([id number]) [id number] Page 6 of 9 Assess potential for pressure ulcer upon admission and as needed Provide education on pressure ulcers Notes: Wound/Skin Impairment Nursing Diagnoses: Impaired tissue integrity Knowledge deficit related to ulceration/compromised skin integrity Goals: Patient will have a decrease in wound volume by X% from date: (specify in notes) Date Initiated: 07/25/2022 Target Resolution Date: 10/17/2022 Goal Status: Active Patient/caregiver will verbalize understanding of skin care regimen Date Initiated: 07/25/2022 Target Resolution Date: 10/04/2022 Goal Status: Active Ulcer/skin breakdown will have a volume reduction of 30% by week 4 Date Initiated: 07/25/2022 Date Inactivated: 08/23/2022 Target Resolution Date: 08/22/2022 Goal Status: Unmet Unmet Reason: remains the same. Interventions: Assess patient/caregiver ability to obtain necessary supplies  Assess patient/caregiver ability to perform ulcer/skin care regimen upon admission and as needed Assess ulceration(s) every visit Provide education on ulcer and skin care Notes: Electronic Signature(s) Signed: 08/23/2022 1:53:12 PM By: Shawn Stall RN, BSN Entered By: Shawn Stall on 08/23/2022 09:18:11 -------------------------------------------------------------------------------- Pain Assessment Details Patient Name: Date of Service: [name], [name] 08/23/2022 8:45 A M Medical Record Number: [id number] Patient Account Number: [id number] Date of Birth/Sex: Treating RN: 01/01/1966 (57 y.o. M) Primary Care Talen Poser: Kalman Shan Other Clinician: Referring Aisa Schoeppner: Treating Margrit Minner/Extender: Dorie Rank Weeks in Treatment:  4 Active Problems Location of Pain Severity and Description of Pain Patient Has Paino No Site Locations Pain Management and Medication DIJOHN, REGEHR ([id number]) [id number] Page 7 of 9 Current Pain Management: Electronic Signature(s) Signed: 08/23/2022 1:10:34 PM By: Thayer Dallas Entered By: Thayer Dallas on 08/23/2022 09:14:41 -------------------------------------------------------------------------------- Patient/Caregiver Education Details Patient Name: Date of Service: [name], [name] 7/12/2024andnbsp8:45 A M Medical Record Number: [id number] Patient Account Number: [id number] Date of Birth/Gender: Treating RN: 09-Apr-1965 (57 y.o. [name] Primary Care Physician: Kalman Shan Other Clinician: Referring Physician: Treating Physician/Extender: Dorie Rank Weeks in Treatment: 4 Education Assessment Education Provided To: Patient Education Topics Provided Wound/Skin Impairment: Handouts: Caring for Your Ulcer Methods: Explain/Verbal Responses: Reinforcements needed Electronic Signature(s) Signed: 08/23/2022 1:53:12 PM By: Shawn Stall RN, BSN Entered By: Shawn Stall on 08/23/2022 09:18:22 -------------------------------------------------------------------------------- Wound Assessment Details Patient Name: Date of Service: [name], [name] 08/23/2022 8:45 A M Medical Record Number: [id number] Patient Account Number: [id number] Date of Birth/Sex: Treating RN: May 16, 1965 (57 y.o. M) Primary Care Youssouf Shipley: Kalman Shan Other Clinician: Referring Janeane Cozart: Treating Doren Kaspar/Extender: Yevonne Aline, KELLY Weeks in Treatment: 4 Wound Status Wound Number: 7 Primary Etiology: Pressure Ulcer Wound Location: Sacrum Wound Status: Open Wounding Event: Pressure Injury Comorbid History: Hypotension, Type II Diabetes, Osteomyelitis, Paraplegia Date Acquired: 07/12/2021 Weeks Of Treatment:  4 Clustered Wound: No Photos ZACCARI, CANUTO ([id number]) [id number] Page 8 of 9 Wound Measurements Length: (cm) 7 Width: (cm) 4 Depth: (cm) 4 Area: (cm) 21.991 Volume: (cm) 87.965 % Reduction in Area: 68.6% % Reduction in Volume: 62% Epithelialization: None Tunneling: Yes Position (o'clock): 12 Maximum Distance: (cm) 0.8 Undermining: No Wound Description Classification: Category/Stage IV Wound Margin: Distinct, outline attached Exudate Amount: Large Exudate Type: Serosanguineous Exudate Color: red, brown Foul Odor After Cleansing: No Slough/Fibrino Yes Wound Bed Granulation Amount: Large (67-100%) Exposed Structure Granulation Quality: Red, Friable Fascia Exposed: No Necrotic Amount: Small (1-33%) Fat Layer (Subcutaneous Tissue) Exposed: Yes Necrotic Quality: Adherent Slough Tendon Exposed: No Muscle Exposed: Yes Necrosis of Muscle: No Joint Exposed: No Bone Exposed: Yes Periwound Skin Texture Texture Color No Abnormalities Noted: No No Abnormalities Noted: No Callus: No Atrophie Blanche: No Crepitus: No Cyanosis: No Excoriation: No Ecchymosis: No Induration: No Erythema: No Rash: No Hemosiderin Staining: No Scarring: No Mottled: No Pallor: No Moisture Rubor: No No Abnormalities Noted: No Dry / Scaly: No Temperature / Pain Maceration: No Temperature: No Abnormality Treatment Notes Wound #7 (Sacrum) Cleanser Soap and Water Discharge Instruction: May shower and wash wound with dial antibacterial soap and water prior to dressing change. Wound Cleanser Discharge Instruction: Cleanse the wound with wound cleanser prior to applying a clean dressing using gauze sponges, not tissue or cotton balls. Peri-Wound Care Topical Primary Dressing Dakin's Solution 0.25%, 16 (oz) Discharge Instruction: Moisten gauze with Dakin's solution Secondary Dressing Woven Gauze Sponge, Non-Sterile 4x4 in [place], [name] ([id number])  [id number] Page 9 of 9 Discharge Instruction: Apply over primary dressing as directed. Zetuvit Plus Silicone Border Sacrum Dressing, Sm, 7x7 (in/in) Discharge Instruction: Apply silicone border over primary dressing as directed. Secured With 31M Medipore H Soft Cloth Surgical T ape, 4 x 10 (in/yd) Discharge Instruction: Secure with tape as directed. Compression Wrap Compression Stockings Add-Ons Electronic Signature(s) Signed: 08/23/2022 1:10:34 PM By: Thayer Dallas Entered By: Thayer Dallas on 08/23/2022 09:12:27 -------------------------------------------------------------------------------- Vitals Details Patient Name: Date of Service: [name], [name] 08/23/2022 8:45 A M Medical Record Number: [id number] Patient Account Number: [id number] Date of Birth/Sex: Treating RN: 11/26/1965 (57 y.o. M) Primary Care Chung Chagoya: Gayleen Orem, Zena Amos Other Clinician: Referring Jaevin Medearis: Treating Sherman Donaldson/Extender: Yevonne Aline, KELLY Weeks in Treatment: 4 Vital Signs Time Taken: 09:14 Temperature (F): 98.1 Pulse (bpm): 71 Respiratory Rate (breaths/min): 20 Blood Pressure (mmHg): 92/62 Reference Range: 80 - 120 mg / dl Electronic Signature(s) Signed: 08/23/2022 1:10:34 PM By: Thayer Dallas Entered By: Thayer Dallas on 08/23/2022 09:14:35

## 2022-08-23 NOTE — Progress Notes (Addendum)
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 week. We have been using silver collagen which close the rest of his wounds but clearly were going to have to make a change here. I moved to [facility] under compression 1/7; he has a new open area on the posterior left calf. I am not sure if this reopened or not. The area on the left fifth metatarsal head is closed there is no other open wound. We did not look at his right leg but per his wife who is usually fairly accurate everything is closed. 1/21; I thought I be able to discharge this patient today. When he was here 2 [name] ago he had 1 remaining wound on the left posterior lateral calf, predictably this is closed today HOWEVER he has a reopening of the area on the plantar left fifth metatarsal head. In a paraplegic these are the classic pressure area openings and when he came here he had left and right pressure ulcers in these areas. He wears POTUS boots but it is possible that he is putting too much pressure on the wheelchair foot rest. ALSO he has really poorly controlled swelling in the left leg, he had his own compression stockings on from elastic therapy in [place] 1/28; predictably his left foot is closed the edema control in his left leg is substantially better. There is however nothing but skin over the fifth metatarsal head in the area of this recurrent wound. He has 20/30 below-knee stockings 07/25/2022 Mr. 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 He visited the [place], [place] ([id number]) [id number] Page 6 of 10 ED on 6/24 and was admitted for sepsis secondary to sacral wound infection. He had a CT abdomen pelvis that did not show evidence of osteomyelitis but did suggest infection of the subcutaneous tissue on 5/25 he had irrigation and debridement of the sacral wound by Dr. Janee Morn with general surgery. He has been using saline wet-to-dry dressings since the surgery. Infectious disease recommended 4 [name] of oral antibiotics including Augmentin and doxycycline. While in the hospital he was on IV antibiotics for about 1 week. He has follow-up with infectious disease on 6/21. He was given an air mattress post discharge. He has home health that comes in weekly to help with assessment and dressing changes. Currently patient denies systemic signs of infection. 6/27; patient presents for follow-up. Patient'[name] been using Dakin'[name] wet-to-dry dressings and trying to offload the wound bed as best he can. He saw infectious disease on 6/21 and plan is for 2 more [name] of Augmentin and is to follow-up with them as needed. Wound has healthier granulation tissue present today. Overall smaller. 7/12; patient presents for follow-up. He is finished his antibiotics. He is completed 6 [name]. Patient was agreeable with starting a wound VAC. He has an air mattress and was agreeable to going to a group 3. Patient History Information obtained from Patient. Family History Diabetes - Mother,Father,Siblings, Hypertension - Mother,Father, No family history of Cancer, Heart Disease, Hereditary Spherocytosis, Kidney Disease, Lung Disease, Seizures, Stroke, Thyroid Problems. Social  History Never smoker, Marital Status - Married, Alcohol Use - Rarely, Drug Use - No History, Caffeine Use - Daily. Medical History Eyes Denies history of Cataracts, Glaucoma, Optic Neuritis Ear/Nose/Mouth/Throat Denies history of Chronic sinus problems/congestion, Middle ear problems Hematologic/Lymphatic Denies history of Anemia, Hemophilia, Human Immunodeficiency Virus, Lymphedema, Sickle Cell Disease Respiratory Denies history of Aspiration, Asthma, Chronic Obstructive Pulmonary Disease (COPD), Pneumothorax, Sleep Apnea, Tuberculosis Cardiovascular Patient has history of Hypotension Denies history of Angina, Arrhythmia, Congestive Heart Failure, Coronary Artery Disease, Deep Vein Thrombosis, Hypertension, Myocardial Infarction, Peripheral Arterial Disease, Peripheral Venous Disease, Phlebitis, Vasculitis Gastrointestinal Denies history of Cirrhosis , Colitis, Crohns, Hepatitis A, Hepatitis B, Hepatitis C Endocrine Patient has history of Type II Diabetes - HgbA1c 8.1 07/06/2022 Denies history of Type I Diabetes Genitourinary Denies history of End Stage Renal Disease Immunological Denies history of Lupus Erythematosus, Raynauds, Scleroderma Integumentary (Skin) Denies history of History of Burn Musculoskeletal Patient has history of Osteomyelitis - left foot 5th toe Denies history of Gout, Rheumatoid Arthritis, Osteoarthritis Neurologic Patient has history of Paraplegia Denies history of Dementia, Neuropathy, Quadriplegia, Seizure Disorder Oncologic Denies history of Received Chemotherapy, Received Radiation Psychiatric Denies history of Anorexia/bulimia, Confinement Anxiety Hospitalization/Surgery History - 07/06/2022 inpatient IandD general surgery; sepsis. 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 - cervical spine surgery. Medical A Surgical History Notes nd Cardiovascular IVF filter Genitourinary neurogenic bowel Neurologic spinal cord injury  T1-T6 Objective Constitutional respirations regular, non-labored and within target range for patient.. Vitals Time Taken: 9:14 AM, Temperature: 98.1 F, Pulse: 71 bpm, Respiratory Rate: 20 breaths/min, Blood Pressure: 92/62 mmHg. Psychiatric pleasant and cooperative. General Notes: Sacrum: Large open wound with increased depth that probes to bone. Granulation tissue to the remaining wound bed. No signs of surrounding BRADFORD, PETROSINO ([id number]) [id number] Page 7 of 10 soft tissue infection. Integumentary (Hair, Skin) Wound #7 status is Open. Original cause of wound was Pressure Injury. The date acquired was: 07/12/2021. The wound has been in treatment 4 [name]. The  wound is located on the Sacrum. The wound measures 7cm length x 4cm width x 4cm depth; 21.991cm^2 area and 87.965cm^3 volume. There is bone, muscle, and Fat Layer (Subcutaneous Tissue) exposed. There is no undermining noted, however, there is tunneling at 12:00 with a maximum distance of 0.8cm. There is a large amount of serosanguineous drainage noted. The wound margin is distinct with the outline attached to the wound base. There is large (67-100%) red, friable granulation within the wound bed. There is a small (1-33%) amount of necrotic tissue within the wound bed including Adherent Slough. The periwound skin appearance did not exhibit: Callus, Crepitus, Excoriation, Induration, Rash, Scarring, Dry/Scaly, Maceration, Atrophie Blanche, Cyanosis, Ecchymosis, Hemosiderin Staining, Mottled, Pallor, Rubor, Erythema. Periwound temperature was noted as No Abnormality. Assessment Active Problems ICD-10 Pressure ulcer of sacral region, stage 4 Type 2 diabetes mellitus with other skin ulcer Paraplegia, complete Patient'[name] wound is stable but has exposed bone. He is completed 6 [name] of IV/oral antibiotics. Will go ahead and start the wound VAC. Will use white foam over the bone and black foam to the remaining  areas. We discussed the importance of aggressive offloading for his wound healing. We will go ahead and order him a group 3 air mattress. He currently has a group 2. Continue Dakin'[name] wet-to-dry dressings prior to [facility] placement and anytime there is a break. Also discussed importance of protein intake for wound healing. Juven sample was given as well. Plan Follow-up Appointments: Return Appointment in: - Dr Mikey Bussing Monday 09/02/2022 3pm ROOM 8 extra time 60 minutes *****HOYER***** Bathing/ Shower/ Hygiene: May shower and wash wound with soap and water. - Dial gold antibacterial soap ( liquid) Negative Presssure Wound Therapy: Wound Vac to wound continuously at 159mm/hg pressure - Ordering from [employer] Black and White Foam combination - ensure white foam covers the bone. Other: - please bridge to right or left hip. Off-Loading: Wound #7 Sacrum: Air fluidized (Group 3) mattress - ordering a group 3 mattress from [employer] Roho cushion for wheelchair Turn and reposition every 2 hours Additional Orders / Instructions: Follow Nutritious Diet - increase protein Juven Shake 1-2 times daily. Home Health: Wound #7 Sacrum: New wound care orders this week; continue Home Health for wound care. May utilize formulary equivalent dressing for wound treatment orders unless otherwise specified. - Home Health to see pt 1xweek. Pack wound with Dakins wet to dry gauze, cover wound with Zetuvit boarder foam, may secure with cloth tape if needed. Continue to use Dakin'[name] until wound vac arrive then apply wound vac three times a week. Dressing changes to be completed by Home Health on Monday / Wednesday / Friday except when patient has scheduled visit at [facility]. - Continue to use Dakin'[name] until wound vac arrive then apply wound vac three times a week. Other Home Health Orders/Instructions: - [facility] WOUND #7: - Sacrum Wound Laterality: Cleanser: Soap and Water 1 x Per  Day/15 Days Discharge Instructions: May shower and wash wound with dial antibacterial soap and water prior to dressing change. Cleanser: Wound Cleanser 1 x Per Day/15 Days Discharge Instructions: Cleanse the wound with wound cleanser prior to applying a clean dressing using gauze sponges, not tissue or cotton balls. Prim Dressing: Dakin'[name] Solution 0.25%, 16 (oz) 1 x Per Day/15 Days ary Discharge Instructions: Moisten gauze with Dakin'[name] solution Secondary Dressing: Woven Gauze Sponge, Non-Sterile 4x4 in (Generic) 1 x Per Day/15 Days Discharge Instructions: Apply over primary dressing as directed. Secondary Dressing: Zetuvit Plus Silicone  Border Sacrum Dressing, Sm, 7x7 (in/in) (Generic) 1 x Per Day/15 Days Discharge Instructions: Apply silicone border over primary dressing as directed. Secured With: 20M Medipore H Soft Cloth Surgical T ape, 4 x 10 (in/yd) (Generic) 1 x Per Day/15 Days Discharge Instructions: Secure with tape as directed. 1. Order wound VAC 2. Order group 3 mattress 3. Dakin'[name] wet-to-dry dressings 4. Juven sample and increase protein intake 5. Follow-up in 1-2 [name] Electronic Signature([name]: 08/27/2022 6:16:33 PM By: Shawn Stall RN, BSN Signed: 08/29/2022 4:16:47 PM By: Harry Corwin DO Previous Signature: 08/23/2022 9:53:20 AM Version By: Melchor Amour ([id number]) [id number] Page 8 of 10 Entered By: Shawn Stall on 08/27/2022 18:14:21 -------------------------------------------------------------------------------- HxROS Details Patient Name: Date of Service: Harry [name] 08/23/2022 8:45 A M Medical Record Number: [id number] Patient Account Number: [id number] Date of Birth/Sex: Treating RN: 12/15/1965 (57 y.o. M) Primary Care Provider: Kalman Shan Other Clinician: Referring Provider: Treating Provider/Extender: Harry [name] in Treatment: 4 Information Obtained  From Patient Eyes Medical History: Negative for: Cataracts; Glaucoma; Optic Neuritis Ear/Nose/Mouth/Throat Medical History: Negative for: Chronic sinus problems/congestion; Middle ear problems Hematologic/Lymphatic Medical History: Negative for: Anemia; Hemophilia; Human Immunodeficiency Virus; Lymphedema; Sickle Cell Disease Respiratory Medical History: Negative for: Aspiration; Asthma; Chronic Obstructive Pulmonary Disease (COPD); Pneumothorax; Sleep Apnea; Tuberculosis Cardiovascular Medical History: Positive for: Hypotension Negative for: Angina; Arrhythmia; Congestive Heart Failure; Coronary Artery Disease; Deep Vein Thrombosis; Hypertension; Myocardial Infarction; Peripheral Arterial Disease; Peripheral Venous Disease; Phlebitis; Vasculitis Past Medical History Notes: IVF filter Gastrointestinal Medical History: Negative for: Cirrhosis ; Colitis; Crohns; Hepatitis A; Hepatitis B; Hepatitis C Endocrine Medical History: Positive for: Type II Diabetes - HgbA1c 8.1 07/06/2022 Negative for: Type I Diabetes Time with diabetes: 5 years Treated with: Oral agents Blood sugar tested every day: No Genitourinary Medical History: Negative for: End Stage Renal Disease Past Medical History Notes: neurogenic bowel Immunological Medical History: Negative for: Lupus Erythematosus; Raynauds; Scleroderma Integumentary (Skin) CANAAN, SCHWENDIMAN ([id number]) [id number] Page 9 of 10 Medical History: Negative for: History of Burn Musculoskeletal Medical History: Positive for: Osteomyelitis - left foot 5th toe Negative for: Gout; Rheumatoid Arthritis; Osteoarthritis Neurologic Medical History: Positive for: Paraplegia Negative for: Dementia; Neuropathy; Quadriplegia; Seizure Disorder Past Medical History Notes: spinal cord injury T1-T6 Oncologic Medical History: Negative for: Received Chemotherapy; Received Radiation Psychiatric Medical History: Negative  for: Anorexia/bulimia; Confinement Anxiety Immunizations Pneumococcal Vaccine: Received Pneumococcal Vaccination: No Implantable Devices None Hospitalization / Surgery History Type of Hospitalization/Surgery 07/06/2022 inpatient IandD general surgery; sepsis 08/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 cervical spine surgery Family and Social History Cancer: No; Diabetes: Yes - Mother,Father,Siblings; Heart Disease: No; Hereditary Spherocytosis: No; Hypertension: Yes - Mother,Father; Kidney Disease: No; Lung Disease: No; Seizures: No; Stroke: No; Thyroid Problems: No; Never smoker; Marital Status - Married; Alcohol Use: Rarely; Drug Use: No History; Caffeine Use: Daily; Financial Concerns: No; Food, Clothing or Shelter Needs: No; Support System Lacking: No; Transportation Concerns: No Electronic Signature([name]: 08/23/2022 9:53:20 AM By: Harry Corwin DO Entered By: Harry [name] on 08/23/2022 09:39:50 -------------------------------------------------------------------------------- SuperBill Details Patient Name: Date of Service: Harry [name], [place] Harry [name] 08/23/2022 Medical Record Number: [id number] Patient Account Number: [id number] Date of Birth/Sex: Treating RN: 07-Nov-1965 (57 y.o. Harry [name] Primary Care Provider: Gayleen [name], Harry [name] Other Clinician: Referring Provider: Treating Provider/Extender: Harry [name] in Treatment: 4 Diagnosis Coding ICD-10 Codes Code Description L89.154 Pressure ulcer of sacral region, stage 4 E11.622 Type 2 diabetes mellitus with other skin ulcer G82.21  Paraplegia, complete Harry [name], Harry [name] ([id number]) [id number] Page 10 of 10 Facility Procedures : CPT4 Code: [id number] Description: 99214 - WOUND CARE VISIT-LEV 4 EST PT Modifier: Quantity: 1 Physician Procedures : CPT4 Code Description Modifier [id number] 99213 - WC PHYS LEVEL 3 - EST PT ICD-10 Diagnosis Description L89.154 Pressure ulcer of sacral  region, stage 4 E11.622 Type 2 diabetes mellitus with other skin ulcer G82.21 Paraplegia, complete Quantity: 1 Electronic Signature([name]: 08/23/2022 9:53:20 AM By: Harry Corwin DO Entered By: Harry [name] on 08/23/2022 09:44:59

## 2022-09-02 ENCOUNTER — Encounter (HOSPITAL_BASED_OUTPATIENT_CLINIC_OR_DEPARTMENT_OTHER): Payer: 59 | Admitting: Internal Medicine

## 2022-09-02 DIAGNOSIS — L89154 Pressure ulcer of sacral region, stage 4: Secondary | ICD-10-CM

## 2022-09-02 DIAGNOSIS — G8221 Paraplegia, complete: Secondary | ICD-10-CM

## 2022-09-02 DIAGNOSIS — E11622 Type 2 diabetes mellitus with other skin ulcer: Secondary | ICD-10-CM

## 2022-09-02 NOTE — Progress Notes (Signed)
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 this patient today. When he was here 2 weeks ago he had 1 remaining wound on the left posterior lateral calf, predictably this is closed today HOWEVER he has a reopening of the area on the plantar left fifth metatarsal head. In a paraplegic these are the classic pressure area openings and when he came here he had left and right pressure ulcers in these areas. He wears POTUS boots but it is possible that he is putting too much pressure on the wheelchair foot rest. ALSO he has really poorly controlled swelling in the left leg, he had his own compression stockings on from elastic therapy in [place] 1/28; predictably his left foot is closed the edema control in his left leg is substantially better. There is however nothing but skin over the fifth metatarsal head in the area of this recurrent wound. 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 He visited the ED on 6/24 and was admitted for sepsis secondary to sacral wound infection. He had a CT abdomen pelvis that did not show evidence of osteomyelitis but did suggest infection of the subcutaneous tissue on 5/25 he had irrigation and debridement of the sacral wound by Dr. Janee Morn with general surgery. He has been using saline wet-to-dry dressings since the  surgery. Infectious disease recommended 4 weeks of oral antibiotics including Augmentin and doxycycline. While in the hospital he was on IV antibiotics for about 1 week. He has follow-up with infectious disease on 6/21. He was  given an air mattress post discharge. He has home health that comes in weekly to help with assessment and dressing changes. Currently patient denies systemic signs of infection. 6/27; patient presents for follow-up. Patient'[name] been using Dakin'[name] wet-to-dry dressings and trying to offload the wound bed as best he can. He saw infectious disease on 6/21 and plan is for 2 more weeks of Augmentin and is to follow-up with them as needed. Wound has healthier granulation tissue present today. Overall smaller. 7/12; patient presents for follow-up. He is finished his antibiotics. He is completed 6 weeks. Patient was agreeable with starting a wound VAC. He has an air mattress and was agreeable to going to a group 3. 7/22; patient presents for follow-up. Patient has the wound VAC but has not been started. He has been using Dakin'[name] wet-to-dry dressings. He is not able to aggressively offload the wound bed. He is in his wheelchair for most of the day. Harry [name], Harry [name] ([id number]) [id number] Page 6 of 10 Patient History Information obtained from Patient. Family History Diabetes - Mother,Father,Siblings, Hypertension - Mother,Father, No family history of Cancer, Heart Disease, Hereditary Spherocytosis, Kidney Disease, Lung Disease, Seizures, Stroke, Thyroid Problems. Social History Never smoker, Marital Status - Married, Alcohol Use - Rarely, Drug Use - No History, Caffeine Use - Daily. Medical History Eyes Denies history of Cataracts, Glaucoma, Optic Neuritis Ear/Nose/Mouth/Throat Denies history of Chronic sinus problems/congestion, Middle ear problems Hematologic/Lymphatic Denies history of Anemia, Hemophilia, Human Immunodeficiency Virus, Lymphedema,  Sickle Cell Disease Respiratory Denies history of Aspiration, Asthma, Chronic Obstructive Pulmonary Disease (COPD), Pneumothorax, Sleep Apnea, Tuberculosis Cardiovascular Patient has history of Hypotension Denies history of Angina, Arrhythmia, Congestive Heart Failure, Coronary Artery Disease, Deep Vein Thrombosis, Hypertension, Myocardial Infarction, Peripheral Arterial Disease, Peripheral Venous Disease, Phlebitis, Vasculitis Gastrointestinal Denies history of Cirrhosis , Colitis, Crohns, Hepatitis A, Hepatitis B, Hepatitis C Endocrine Patient has history of Type II Diabetes - HgbA1c 8.1 07/06/2022 Denies history of Type I Diabetes Genitourinary Denies history of End Stage Renal Disease Immunological Denies history of Lupus Erythematosus, Raynauds, Scleroderma Integumentary (Skin) Denies history of History of Burn Musculoskeletal Patient has history of Osteomyelitis - left foot 5th toe Denies history of Gout, Rheumatoid Arthritis, Osteoarthritis Neurologic Patient has history of Paraplegia Denies history of Dementia, Neuropathy, Quadriplegia, Seizure Disorder Oncologic Denies history of Received Chemotherapy, Received Radiation Psychiatric Denies history of Anorexia/bulimia, Confinement Anxiety Hospitalization/Surgery History - 07/06/2022 inpatient IandD general surgery; sepsis. 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 - cervical spine surgery. Medical A Surgical History Notes nd Cardiovascular IVF filter Genitourinary neurogenic bowel Neurologic spinal cord injury T1-T6 Objective Constitutional respirations regular, non-labored and within target range for patient.. Vitals Time Taken: 3:14 PM, Temperature: 98.2 F, Pulse: 80 bpm, Respiratory Rate: 18 breaths/min, Blood Pressure: 108/69 mmHg. Psychiatric pleasant and cooperative. General Notes: Sacrum: Large open wound with increased depth that probes to bone. Granulation tissue to the remaining wound bed. No signs of  surrounding soft tissue infection. Integumentary (Hair, Skin) Wound #7 status is Open. Original cause of wound was Pressure Injury. The date acquired was: 07/12/2021. The wound has been in treatment 5 weeks. The wound is located on the Sacrum. The wound measures 6cm length x 2cm width x 4cm depth; 9.425cm^2 area and 37.699cm^3 volume. There is bone, muscle, and Fat Layer (Subcutaneous Tissue) exposed. There is no tunneling noted, however, there is undermining starting at 4:00 and ending at 9:00 with a maximum distance of 2.4cm. There is a large amount of serosanguineous drainage noted. The wound margin is distinct with the  outline attached to the wound base. There is large (67-100%) red, friable granulation within the wound bed. There is a small (1-33%) amount of necrotic tissue within the wound bed including Adherent Slough. The periwound skin appearance did not exhibit: Callus, Crepitus, Excoriation, Induration, Rash, Scarring, Dry/Scaly, Maceration, Atrophie Blanche, Cyanosis, Ecchymosis, Hemosiderin Staining, Mottled, Pallor, Rubor, Erythema. Periwound temperature was noted as No Abnormality. Harry [name], Harry [name] ([id number]) [id number] Page 7 of 10 Assessment Active Problems ICD-10 Pressure ulcer of sacral region, stage 4 Type 2 diabetes mellitus with other skin ulcer Paraplegia, complete Patient'[name] wound is stable. No signs of active acute infection. Wound VAC was started today in office. I recommended doing Dakin'[name] wet-to-dry dressings if he ever has to take the wound VAC off. She has home health that will come out twice weekly to change this. I recommended continuing to aggressively offload the wound bed. Without pressure relief I stated that the wound will not heal and will progressively get worse. We will see him back in 1 week. Plan Follow-up Appointments: Return Appointment in 1 week. - Dr Mikey Bussing Monday 09/09/2022 215pm ROOM 8 extra time 60 minutes  *****HOYER***** Bathing/ Shower/ Hygiene: May shower and wash wound with soap and water. - Dial gold antibacterial soap ( liquid) Negative Presssure Wound Therapy: Wound Vac to wound continuously at 166mm/hg pressure - [employer] Black and White Foam combination - *****ensure white foam covers the bone.****** Other: - please bridge to right or left hip. ***Home health to change the wound vac dressing Wednesday and Friday this week.**** Off-Loading: Wound #7 Sacrum: Air fluidized (Group 3) mattress - ordering a group 3 mattress from [employer] cushion for wheelchair Turn and reposition every 2 hours Additional Orders / Instructions: Follow Nutritious Diet - increase protein Juven Shake 1-2 times daily. Home Health: Wound #7 Sacrum: New wound care orders this week; continue Home Health for wound care. May utilize formulary equivalent dressing for wound treatment orders unless otherwise specified. - ***Home health to change the wound vac dressing Wednesday and Friday this week.**** ENSURE WHITE FOAM COVERS THE BONE!!!!! Dressing changes to be completed by Home Health on Monday / Wednesday / Friday except when patient has scheduled visit at [facility]. Other Home Health Orders/Instructions: - [facility] WOUND #7: - Sacrum Wound Laterality: Cleanser: Wound Cleanser (Home Health) 3 x Per Week/15 Days Discharge Instructions: Cleanse the wound with wound cleanser prior to applying a clean dressing using gauze sponges, not tissue or cotton balls. Peri-Wound Care: Skin Prep (Home Health) 3 x Per Week/15 Days Discharge Instructions: Use skin prep as directed Prim Dressing: wound vac white foam and black foam 3 x Per Week/15 Days ary Discharge Instructions: white foam cover bone; black foam cover white foam, bridged to left or right hip. 1. Start wound VAC 2. Follow-up in 1 week 3. Aggressive offloading Electronic Signature([name]: 09/02/2022 4:55:26 PM By: Harry Corwin DO Entered By: Harry [name] on 09/02/2022 16:31:25 -------------------------------------------------------------------------------- HxROS Details Patient Name: Date of Service: Harry [name], Harry [name] 09/02/2022 3:00 PM Medical Record Number: [id number] Patient Account Number: [id number] Date of Birth/Sex: Treating RN: 1965/12/30 (57 y.o. M) Primary Care Provider: Kalman Shan Other Clinician: Referring Provider: Treating Provider/Extender: Harry [name], Harry [name] Weeks in Treatment: 5 Harry [name], Harry [name] ([id number]) [id number] Page 8 of 10 Information Obtained From Patient Eyes Medical History: Negative for: Cataracts; Glaucoma; Optic Neuritis Ear/Nose/Mouth/Throat Medical History: Negative for: Chronic sinus problems/congestion; Middle ear problems Hematologic/Lymphatic Medical History: Negative for: Anemia; Hemophilia; Human Immunodeficiency  Virus; Lymphedema; Sickle Cell Disease Respiratory Medical History: Negative for: Aspiration; Asthma; Chronic Obstructive Pulmonary Disease (COPD); Pneumothorax; Sleep Apnea; Tuberculosis Cardiovascular Medical History: Positive for: Hypotension Negative for: Angina; Arrhythmia; Congestive Heart Failure; Coronary Artery Disease; Deep Vein Thrombosis; Hypertension; Myocardial Infarction; Peripheral Arterial Disease; Peripheral Venous Disease; Phlebitis; Vasculitis Past Medical History Notes: IVF filter Gastrointestinal Medical History: Negative for: Cirrhosis ; Colitis; Crohns; Hepatitis A; Hepatitis B; Hepatitis C Endocrine Medical History: Positive for: Type II Diabetes - HgbA1c 8.1 07/06/2022 Negative for: Type I Diabetes Time with diabetes: 5 years Treated with: Oral agents Blood sugar tested every day: No Genitourinary Medical History: Negative for: End Stage Renal Disease Past Medical History Notes: neurogenic bowel Immunological Medical History: Negative for: Lupus  Erythematosus; Raynauds; Scleroderma Integumentary (Skin) Medical History: Negative for: History of Burn Musculoskeletal Medical History: Positive for: Osteomyelitis - left foot 5th toe Negative for: Gout; Rheumatoid Arthritis; Osteoarthritis Neurologic Medical History: Positive for: Paraplegia Negative for: Dementia; Neuropathy; Quadriplegia; Seizure Disorder Past Medical History Notes: spinal cord injury T1-T6 Oncologic Medical HistoryMarland Kitchen Harry [name], Harry [name] ([id number]) [id number] Page 9 of 10 Negative for: Received Chemotherapy; Received Radiation Psychiatric Medical History: Negative for: Anorexia/bulimia; Confinement Anxiety Immunizations Pneumococcal Vaccine: Received Pneumococcal Vaccination: No Implantable Devices None Hospitalization / Surgery History Type of Hospitalization/Surgery 07/06/2022 inpatient IandD general surgery; sepsis 08/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 cervical spine surgery Family and Social History Cancer: No; Diabetes: Yes - Mother,Father,Siblings; Heart Disease: No; Hereditary Spherocytosis: No; Hypertension: Yes - Mother,Father; Kidney Disease: No; Lung Disease: No; Seizures: No; Stroke: No; Thyroid Problems: No; Never smoker; Marital Status - Married; Alcohol Use: Rarely; Drug Use: No History; Caffeine Use: Daily; Financial Concerns: No; Food, Clothing or Shelter Needs: No; Support System Lacking: No; Transportation Concerns: No Electronic Signature([name]: 09/02/2022 4:55:26 PM By: Harry Corwin DO Entered By: Harry [name] on 09/02/2022 16:28:12 -------------------------------------------------------------------------------- SuperBill Details Patient Name: Date of Service: Harry [name], Harry [name] 09/02/2022 Medical Record Number: [id number] Patient Account Number: [id number] Date of Birth/Sex: Treating RN: [birth year] (57 y.o. [name] Primary Care Provider: Gayleen [name], Harry [name] Other Clinician: Referring  Provider: Treating Provider/Extender: Harry [name] [name], Harry [name] Weeks in Treatment: 5 Diagnosis Coding ICD-10 Codes Code [name] L89.154 Pressure ulcer of sacral region, stage 4 E11.622 Type 2 diabetes mellitus with other skin ulcer G82.21 Paraplegia, complete Facility Procedures : CPT4 Code: [id number] [name]: [id number] - WOUND VAC-50 SQ CM OR LESS Modifier: Quantity: 1 Physician Procedures : CPT4 Code [name] Modifier [id number] - WC PHYS LEVEL 3 - EST PT ICD-10 Diagnosis [name] L89.154 Pressure ulcer of sacral region, stage 4 E11.622 Type 2 diabetes mellitus with other skin ulcer G82.21 Paraplegia, complete Quantity: 1 Electronic Signature([name]: 09/02/2022 4:55:26 PM By: Orlene Erm, Harry [name] ([id number]) PM By: Harry Corwin DO [id number] Page 10 of 10 Signed: 09/02/2022 4:55:26 Entered By: Harry [name] on 09/02/2022 16:31:40

## 2022-09-04 NOTE — Progress Notes (Signed)
AULTON, ROUTT ([id number]) [id number] Page 1 of 8 Visit Report for 09/02/2022 Arrival Information Details Patient Name: Date of Service: [name] 09/02/2022 3:00 PM Medical Record Number: [id number] Patient Account Number: [id number] Date of Birth/Sex: Treating RN: Oct 04, 1965 (57 y.o. M) Primary Care [name]: [name], [name] Other Clinician: Referring [name]: Treating [name]/Extender: [name] [name], [name] Weeks in Treatment: 5 Visit Information History Since Last Visit Added or deleted any medications: No Patient Arrived: Wheel Chair Any new allergies or adverse reactions: No Arrival Time: 15:14 Had a fall or experienced change in No Accompanied By: wife activities of daily living that may affect Transfer Assistance: [name] Lift risk of falls: Patient Identification Verified: Yes Signs or symptoms of abuse/neglect since last visito No Secondary Verification Process Completed: Yes Hospitalized since last visit: No Patient Requires Transmission-Based Precautions: No Implantable device outside of the clinic excluding No Patient Has Alerts: No cellular tissue based products placed in the center since last visit: Has Dressing in Place as Prescribed: Yes Pain Present Now: No Electronic Signature(s) Signed: 09/04/2022 11:54:52 AM By: [name] Entered By: [name] on 09/02/2022 15:14:49 -------------------------------------------------------------------------------- Encounter Discharge Information Details Patient Name: Date of Service: [name], [name] 09/02/2022 3:00 PM Medical Record Number: [id number] Patient Account Number: [id number] Date of Birth/Sex: Treating RN: December 03, 1965 (57 y.o. [name] Primary Care [name]: [name] Other Clinician: Referring [name]: Treating [name]/Extender: [name] [name], [name] Weeks in Treatment: 5 Encounter Discharge Information Items Discharge  Condition: Stable Ambulatory Status: Wheelchair Discharge Destination: Home Transportation: Private Auto Accompanied By: wife Schedule Follow-up Appointment: Yes Clinical Summary of Care: Electronic Signature(s) Signed: 09/03/2022 4:56:09 PM By: Harry Stall RN, BSN Entered By: [name] on 09/02/2022 15:45:57 [name] ([id number]) [id number] Page 2 of 8 -------------------------------------------------------------------------------- Lower Extremity Assessment Details Patient Name: Date of Service: [name] 09/02/2022 3:00 PM Medical Record Number: [id number] Patient Account Number: [id number] Date of Birth/Sex: Treating RN: 1965/07/23 (57 y.o. [name] Primary Care Callaghan Laverdure: [name], [name] Other Clinician: Referring [name]: Treating [name]/Extender: [name] [name], [name] Weeks in Treatment: 5 Electronic Signature(s) Signed: 09/03/2022 4:56:09 PM By: Harry Stall RN, BSN Entered By: [name] on 09/02/2022 15:25:36 -------------------------------------------------------------------------------- Multi Wound Chart Details Patient Name: Date of Service: [name], [name] 09/02/2022 3:00 PM Medical Record Number: [id number] Patient Account Number: [id number] Date of Birth/Sex: Treating RN: 09-26-1965 (57 y.o. M) Primary Care [name]: [name] Other Clinician: Referring Marlon Suleiman: Treating Wilba Mutz/Extender: [name] [name], [name] Weeks in Treatment: 5 Vital Signs Height(in): Pulse(bpm): 80 Weight(lbs): Blood Pressure(mmHg): 108/69 Body Mass Index(BMI): Temperature(F): 98.2 Respiratory Rate(breaths/min): 18 [7:Photos:] [N/A:N/A] Sacrum N/A N/A Wound Location: Pressure Injury N/A N/A Wounding Event: Pressure Ulcer N/A N/A Primary Etiology: Hypotension, Type II Diabetes, N/A N/A Comorbid History: Osteomyelitis, Paraplegia 07/12/2021 N/A N/A Date Acquired: 5 N/A N/A Weeks of  Treatment: Open N/A N/A Wound Status: No N/A N/A Wound Recurrence: 6x2x4 N/A N/A Measurements L x W x D (cm) 9.425 N/A N/A A (cm) : rea 37.699 N/A N/A Volume (cm) : 86.60% N/A N/A % Reduction in A rea: 83.70% N/A N/A % Reduction in Volume: 4 Starting Position 1 (o'clock): 9 Ending Position 1 (o'clock): 2.4 Maximum Distance 1 (cm): Yes N/A N/A Undermining: Category/Stage IV N/A N/A Classification: Large N/A N/A Exudate A mount: Serosanguineous N/A N/A Exudate Type: red, brown N/A N/A Exudate ColorREIGN, BARTNICK ([id number]) [id number] Page 3 of 8 Distinct, outline attached N/A N/A Wound  Margin: Large (67-100%) N/A N/A Granulation Amount: Red, Friable N/A N/A Granulation Quality: Small (1-33%) N/A N/A Necrotic Amount: Fat Layer (Subcutaneous Tissue): Yes N/A N/A Exposed Structures: Muscle: Yes Bone: Yes Fascia: No Tendon: No Joint: No None N/A N/A Epithelialization: Excoriation: No N/A N/A Periwound Skin Texture: Induration: No Callus: No Crepitus: No Rash: No Scarring: No Maceration: No N/A N/A Periwound Skin Moisture: Dry/Scaly: No Atrophie Blanche: No N/A N/A Periwound Skin Color: Cyanosis: No Ecchymosis: No Erythema: No Hemosiderin Staining: No Mottled: No Pallor: No Rubor: No No Abnormality N/A N/A Temperature: Negative Pressure Wound Therapy N/A N/A Procedures Performed: Application (NPWT) Treatment Notes Wound #7 (Sacrum) Cleanser Wound Cleanser Discharge Instruction: Cleanse the wound with wound cleanser prior to applying a clean dressing using gauze sponges, not tissue or cotton balls. Peri-Wound Care Skin Prep Discharge Instruction: Use skin prep as directed Topical Primary Dressing wound vac white foam and black foam Discharge Instruction: white foam cover bone; black foam cover white foam, bridged to left or right hip. Secondary Dressing Secured With Compression Wrap Compression  Stockings Add-Ons Electronic Signature(s) Signed: 09/02/2022 4:55:26 PM By: Harry Corwin DO Entered By: [name] on 09/02/2022 16:25:11 -------------------------------------------------------------------------------- Multi-Disciplinary Care Plan Details Patient Name: Date of Service: [name], [name] 09/02/2022 3:00 PM Medical Record Number: [id number] Patient Account Number: [id number] Date of Birth/Sex: Treating RN: 01/26/1966 (57 y.o. [name] Primary Care Jamari Moten: [name], [name] Other Clinician: Referring Shatina Streets: Treating Allister Lessley/Extender: Jule Ser [name], [name] Weeks in Treatment: 5 [place], [name] ([id number]) [id number] Page 4 of 8 Active Inactive Pressure Nursing Diagnoses: Knowledge deficit related to causes and risk factors for pressure ulcer development Knowledge deficit related to management of pressures ulcers Goals: Patient will remain free from development of additional pressure ulcers Date Initiated: 07/25/2022 Target Resolution Date: 09/19/2022 Goal Status: Active Patient will remain free of pressure ulcers Date Initiated: 07/25/2022 Target Resolution Date: 09/19/2022 Goal Status: Active Patient/caregiver will verbalize risk factors for pressure ulcer development Date Initiated: 07/25/2022 Target Resolution Date: 08/08/2022 Goal Status: Active Patient/caregiver will verbalize understanding of pressure ulcer management Date Initiated: 07/25/2022 Target Resolution Date: 09/27/2022 Goal Status: Active Interventions: Assess: immobility, friction, shearing, incontinence upon admission and as needed Assess offloading mechanisms upon admission and as needed Assess potential for pressure ulcer upon admission and as needed Provide education on pressure ulcers Notes: Wound/Skin Impairment Nursing Diagnoses: Impaired tissue integrity Knowledge deficit related to ulceration/compromised skin  integrity Goals: Patient will have a decrease in wound volume by X% from date: (specify in notes) Date Initiated: 07/25/2022 Target Resolution Date: 10/17/2022 Goal Status: Active Patient/caregiver will verbalize understanding of skin care regimen Date Initiated: 07/25/2022 Target Resolution Date: 10/04/2022 Goal Status: Active Ulcer/skin breakdown will have a volume reduction of 30% by week 4 Date Initiated: 07/25/2022 Date Inactivated: 08/23/2022 Target Resolution Date: 08/22/2022 Goal Status: Unmet Unmet Reason: remains the same. Interventions: Assess patient/caregiver ability to obtain necessary supplies Assess patient/caregiver ability to perform ulcer/skin care regimen upon admission and as needed Assess ulceration(s) every visit Provide education on ulcer and skin care Notes: Negative Pressure Wound Therapy Nursing Diagnoses: Knowledge deficit related to use and safety of the device Goals: Patient/caregiver agrees to and verbalizes understanding of need to use Date Initiated: 09/02/2022 Target Resolution Date: 09/20/2022 Goal Status: Active Patient/caregiver will verbalize understanding of use of the device Date Initiated: 09/02/2022 Target Resolution Date: 09/20/2022 Goal Status: Active Nutritional supplements and/or vitamins as prescribed Date Initiated: 09/02/2022 Target Resolution Date: 09/20/2022 Goal Status: Active Interventions: Assess patient nutrition  upon admission and as needed per policy Assess patient understanding of disease process and management [name], [name] ([id number]) [id number] Page 5 of 8 Monitor and protect skin around the wound Provide education on nutrition Provide education on use, care, and troubleshooting Treatment Activities: Referred to DME Jericka Kadar for dressing supplies : 09/02/2022 Support surface (Group 2/3) in use : 09/02/2022 Turning and Repositioning schedule in place : 09/02/2022 Notes: Electronic  Signature(s) Signed: 09/03/2022 4:56:09 PM By: Harry Stall RN, BSN Entered By: [name] on 09/02/2022 15:44:56 -------------------------------------------------------------------------------- Negative Pressure Wound Therapy Application (NPWT) Details Patient Name: Date of Service: [name] 09/02/2022 3:00 PM Medical Record Number: [id number] Patient Account Number: [id number] Date of Birth/Sex: Treating RN: January 10, 1966 (57 y.o. [name] Primary Care Tresea Heine: [name] Other Clinician: Referring Nargis Abrams: Treating Keymarion Bearman/Extender: [name] [name], [name] Weeks in Treatment: 5 NPWT Application Performed for: Wound #7 Sacrum Performed By: Harry Stall, RN Type: VAC Freedom System Coverage Size (sq cm): 12 Pressure Type: Constant Pressure Setting: 125 mmHG Drain Type: None Primary Contact: Non-Adherent Quantity of Sponges/Gauze Inserted: x1 white foam cover bone, x1 black foam bridged. Sponge/Dressing Type: Foam- Black, Foam- White Date Initiated: 09/02/2022 Response to Treatment: tolerated well. Post Procedure Diagnosis Same as Pre-procedure Notes white foam covering the bone. Black foam bridged to left hip. Electronic Signature(s) Signed: 09/03/2022 4:56:09 PM By: Harry Stall RN, BSN Entered By: [name] on 09/02/2022 15:41:16 -------------------------------------------------------------------------------- Pain Assessment Details Patient Name: Date of Service: [name], [name] 09/02/2022 3:00 PM Medical Record Number: [id number] Patient Account Number: [id number] Date of Birth/Sex: Treating RN: 06/11/1965 (57 y.o. [name] Primary Care Yosselyn Tax: [name], [name] Other Clinician: Referring Muzamil Harker: Treating Mandee Pluta/Extender: [name] [name], [name] Weeks in Treatment: 5 Active Problems Location of Pain Severity and Description of Pain Harry, [name] ([id number]) [phone].pdf Page 6  of 8 Patient Has Paino No Site Locations Pain Management and Medication Current Pain Management: Electronic Signature(s) Signed: 09/03/2022 4:56:09 PM By: Harry Stall RN, BSN Entered By: [name] on 09/02/2022 15:25:46 -------------------------------------------------------------------------------- Patient/Caregiver Education Details Patient Name: Date of Service: [name] 7/22/2024andnbsp3:00 PM Medical Record Number: [id number] Patient Account Number: [id number] Date of Birth/Gender: Treating RN: [birth year] (57 y.o. [name] Primary Care Physician: [name] Other Clinician: Referring Physician: Treating Physician/Extender: Jule Ser [name], [name] Weeks in Treatment: 5 Education Assessment Education Provided To: Patient and Caregiver Education Topics Provided Nutrition: Handouts: Elevated Blood Sugars: How Do They Affect Wound Healing, Nutrition Methods: Explain/Verbal Responses: Reinforcements needed Pressure: Handouts: Pressure Injury: Prevention and Offloading Methods: Explain/Verbal Responses: Reinforcements needed Electronic Signature(s) Signed: 09/03/2022 4:56:09 PM By: Harry Stall RN, BSN Entered By: [name] on 09/02/2022 15:45:24 [name] ([id number]) [id number] Page 7 of 8 -------------------------------------------------------------------------------- Wound Assessment Details Patient Name: Date of Service: [name] 09/02/2022 3:00 PM Medical Record Number: [id number] Patient Account Number: [id number] Date of Birth/Sex: Treating RN: 05/09/1965 (57 y.o. [name] Primary Care Tifini Reeder: [name], [name] Other Clinician: Referring Shoichi Mielke: Treating Porschea Borys/Extender: [name] [name], [name] Weeks in Treatment: 5 Wound Status Wound Number: 7 Primary Etiology: Pressure Ulcer Wound Location: Sacrum Wound Status: Open Wounding Event: Pressure  Injury Comorbid History: Hypotension, Type II Diabetes, Osteomyelitis, Paraplegia Date Acquired: 07/12/2021 Weeks Of Treatment: 5 Clustered Wound: No Photos Wound Measurements Length: (cm) 6 Width: (cm) 2 Depth: (cm) 4 Area: (cm) 9.425 Volume: (cm) 37.699 % Reduction in Area: 86.6% % Reduction in Volume: 83.7% Epithelialization: None  Tunneling: No Undermining: Yes Starting Position (o'clock): 4 Ending Position (o'clock): 9 Maximum Distance: (cm) 2.4 Wound Description Classification: Category/Stage IV Wound Margin: Distinct, outline attached Exudate Amount: Large Exudate Type: Serosanguineous Exudate Color: red, brown Foul Odor After Cleansing: No Slough/Fibrino Yes Wound Bed Granulation Amount: Large (67-100%) Exposed Structure Granulation Quality: Red, Friable Fascia Exposed: No Necrotic Amount: Small (1-33%) Fat Layer (Subcutaneous Tissue) Exposed: Yes Necrotic Quality: Adherent Slough Tendon Exposed: No Muscle Exposed: Yes Necrosis of Muscle: No Joint Exposed: No Bone Exposed: Yes Periwound Skin Texture Texture Color No Abnormalities Noted: No No Abnormalities Noted: No Callus: No Atrophie Blanche: No Crepitus: No Cyanosis: No Excoriation: No Ecchymosis: No Induration: No Erythema: No Rash: No Hemosiderin Staining: No Scarring: No Mottled: No Pallor: No Harry, [name] ([id number]) [phone].pdf Page 8 of 8 Pallor: No Moisture Rubor: No No Abnormalities Noted: No Dry / Scaly: No Temperature / Pain Maceration: No Temperature: No Abnormality Treatment Notes Wound #7 (Sacrum) Cleanser Wound Cleanser Discharge Instruction: Cleanse the wound with wound cleanser prior to applying a clean dressing using gauze sponges, not tissue or cotton balls. Peri-Wound Care Skin Prep Discharge Instruction: Use skin prep as directed Topical Primary Dressing wound vac white foam and black foam Discharge Instruction: white foam cover bone;  black foam cover white foam, bridged to left or right hip. Secondary Dressing Secured With Compression Wrap Compression Stockings Add-Ons Electronic Signature(s) Signed: 09/03/2022 4:56:09 PM By: Harry Stall RN, BSN Entered By: [name] on 09/02/2022 15:25:21 -------------------------------------------------------------------------------- Vitals Details Patient Name: Date of Service: [name], [name] 09/02/2022 3:00 PM Medical Record Number: [id number] Patient Account Number: [id number] Date of Birth/Sex: Treating RN: 03-14-1965 (57 y.o. M) Primary Care Olena Willy: [name], [name] Other Clinician: Referring Malkia Nippert: Treating Macie Baum/Extender: [name] [name], [name] Weeks in Treatment: 5 Vital Signs Time Taken: 15:14 Temperature (F): 98.2 Pulse (bpm): 80 Respiratory Rate (breaths/min): 18 Blood Pressure (mmHg): 108/69 Reference Range: 80 - 120 mg / dl Electronic Signature(s) Signed: 09/04/2022 11:54:52 AM By: [name] Entered By: [name] on 09/02/2022 15:15:10

## 2022-09-06 ENCOUNTER — Encounter: Payer: 59 | Attending: Physical Medicine and Rehabilitation | Admitting: Physical Medicine and Rehabilitation

## 2022-09-06 ENCOUNTER — Encounter: Payer: Self-pay | Admitting: Physical Medicine and Rehabilitation

## 2022-09-06 VITALS — BP 110/72 | HR 71 | Ht 67.9 in

## 2022-09-06 DIAGNOSIS — M7918 Myalgia, other site: Secondary | ICD-10-CM | POA: Insufficient documentation

## 2022-09-06 DIAGNOSIS — N319 Neuromuscular dysfunction of bladder, unspecified: Secondary | ICD-10-CM | POA: Diagnosis present

## 2022-09-06 DIAGNOSIS — G8250 Quadriplegia, unspecified: Secondary | ICD-10-CM | POA: Insufficient documentation

## 2022-09-06 DIAGNOSIS — Z993 Dependence on wheelchair: Secondary | ICD-10-CM | POA: Diagnosis present

## 2022-09-06 DIAGNOSIS — L89304 Pressure ulcer of unspecified buttock, stage 4: Secondary | ICD-10-CM | POA: Insufficient documentation

## 2022-09-06 DIAGNOSIS — Z978 Presence of other specified devices: Secondary | ICD-10-CM | POA: Diagnosis not present

## 2022-09-06 DIAGNOSIS — G8921 Chronic pain due to trauma: Secondary | ICD-10-CM | POA: Insufficient documentation

## 2022-09-06 MED ORDER — LIDOCAINE HCL 1 % IJ SOLN
6.0000 mL | Freq: Once | INTRAMUSCULAR | Status: AC
Start: 2022-09-06 — End: 2022-09-06
  Administered 2022-09-06: 6 mL

## 2022-09-06 NOTE — Patient Instructions (Addendum)
Plan: Needs to do pressure relief every 20 minutes- set a timer- it won't heal if we don't fix this.  Only allowed in w/c 1 hour/day.  2 minutes minimum- tilt, not recline  2.  S/P IV ABX for osteomyelitis of sacrum.   3. If doing foley catheter, and needing ot be changed ~ 1 month then don't need to do vinegar solution- but if needs to change the catheter every week, call me    4. Never heard from Urology- will place consult/referral with [facility]- due to C6 quadriplegia/foley but usually in/out caths and neurogenic bladder.   5. Patient here for trigger point injections for  Consent done and on chart.  Cleaned areas with alcohol and injected using a 27 gauge 1.5 inch needle  Injected 6cc- none wasted Using 1% Lidocaine with no EPI  Upper traps- B/L - lots of twitch responses Levators- B/L  Posterior scalenes Middle scalenes- B/L  Splenius Capitus- B/L  Pectoralis Major- B/L  Rhomboids B/L x2 Infraspinatus- B/L  Teres Major/minor- B/L  Thoracic paraspinals Lumbar paraspinals Other injections- B/L deltoids   Patient's level of pain prior was 5-6/10 Current level of pain after injections is- 3/10  There was no bleeding or complications.  Patient was advised to drink a lot of water on day after injections to flush system Will have increased soreness for 12-48 hours after injections.  Can use Lidocaine patches the day AFTER injections Can use theracane on day of injections in places didn't inject Can use heating pad 4-6 hours AFTER injections  6. F/U 3 months- double appt- SCI and TrP injections.

## 2022-09-06 NOTE — Addendum Note (Signed)
Addended by: Janean Sark on: 09/06/2022 10:11 AM   Modules accepted: Orders

## 2022-09-06 NOTE — Progress Notes (Signed)
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     Admitted to hospital in May for pressure ulcer-  Done with IV ABX for 6 weeks due to osteomyelitis. - getitng a wound VAC as of 7/12- hadn't been started as of 7/22, but was getting Dakin's.  Got the wound VAC in place now.  Changing the wound VAC 3x/week- by H/H.   Neck pain is bothering him more- so wants TrP injections.  Everything else is "the same".   We discussed dilute vinegar last visit- didn't do it. Scared to do it.  Tried it once, but he didn't like it-  When he's blocked with sediment- takes foley out and places a new one. Changing 28 days or so usually, but hasn't had to do early lately- before was every week, but now not very often.    Cannot do in/out caths due to wound on backside.    Plan: Needs to do pressure relief - 2 minutes minimum- tilt, not recline- every 20 minutes- set a timer- it won't heal if we don't fix this.  Only allowed in w/c 1 hour/day.   2.  S/P IV ABX for osteomyelitis of sacrum.   3. If doing foley catheter, and needing ot be changed ~ 1 month then don't need to do vinegar solution- but if needs to change the catheter every week, call me    4. Never heard from Urology- will place consult/referral with [facility]- due to C6 quadriplegia/foley but usually in/out caths and neurogenic bladder.   5. Patient here for trigger point injections for  Consent done and on chart.  Cleaned areas with alcohol and injected using a 27 gauge 1.5 inch needle  Injected 6cc- none wasted Using 1% Lidocaine with no EPI  Upper traps- B/L - lots of twitch responses Levators- B/L   Posterior scalenes Middle scalenes- B/L  Splenius Capitus- B/L  Pectoralis Major- B/L  Rhomboids B/L x2 Infraspinatus- B/L  Teres Major/minor- B/L  Thoracic paraspinals Lumbar paraspinals Other injections- B/L deltoids   Patient's level of pain prior was 5-6/10 Current level of pain after injections is- 3/10  There was no bleeding or complications.  Patient was advised to drink a lot of water on day after injections to flush system Will have increased soreness for 12-48 hours after injections.  Can use Lidocaine patches the day AFTER injections Can use theracane on day of injections in places didn't inject Can use heating pad 4-6 hours AFTER injections  6. F/U 3 months- double appt- SCI and TrP injections.    I spent a total of 41   minutes on total care today- >50% coordination of care- due to  Demonstrated pressure relief- took awhile-  tilt in space, not recline- also 8 minutes on trigger point injections and rest- d/w them about bladder-  and getting Urology consult- trying again.

## 2022-09-09 ENCOUNTER — Encounter (HOSPITAL_BASED_OUTPATIENT_CLINIC_OR_DEPARTMENT_OTHER): Payer: 59 | Admitting: Internal Medicine

## 2022-09-09 DIAGNOSIS — G8221 Paraplegia, complete: Secondary | ICD-10-CM

## 2022-09-09 DIAGNOSIS — L89154 Pressure ulcer of sacral region, stage 4: Secondary | ICD-10-CM

## 2022-09-09 DIAGNOSIS — E11622 Type 2 diabetes mellitus with other skin ulcer: Secondary | ICD-10-CM | POA: Diagnosis not present

## 2022-09-10 NOTE — Progress Notes (Signed)
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 the posterior left calf. I am not sure if this reopened or not. The area on the left fifth metatarsal head is closed there is no other open wound. We did not look at his right leg but per his wife who is usually fairly accurate everything is closed. 1/21; I thought I be able to discharge this patient today. When he was here 2 weeks ago he had 1 remaining wound on the left posterior lateral calf, predictably this is closed today HOWEVER he has a reopening of the area on the plantar left fifth metatarsal head. In a paraplegic these are the classic pressure area openings and when he came here he had left and right pressure ulcers in these areas. He wears POTUS boots but it is possible that he is putting too much pressure on the wheelchair foot rest. ALSO he has really poorly controlled swelling in the left leg, he had his own compression stockings on from elastic therapy in [place] 1/28; predictably his left foot is closed the edema control in his left leg is substantially better. There is however nothing but skin over the fifth metatarsal  head in the area of this recurrent wound. He has 20/30 below-knee stockings 07/25/2022 Mr. 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 He visited the [place], [place] ([id number]) [id number] Page 6 of 10 ED on 6/24 and was admitted for sepsis secondary to sacral wound infection. He had a CT abdomen pelvis that did not show evidence of osteomyelitis but did suggest infection of the subcutaneous tissue on 5/25 he had irrigation and debridement of the sacral wound by Dr. Janee Morn with general surgery. He has been using saline wet-to-dry dressings since the surgery. Infectious disease recommended 4 weeks of oral antibiotics including Augmentin and doxycycline. While in the hospital he was on IV antibiotics for about 1 week. He has follow-up with infectious disease on 6/21. He was given an air mattress post discharge. He has home health that comes in weekly to help with assessment and dressing changes. Currently patient denies systemic signs of infection. 6/27; patient presents for follow-up. Patient'[name] been using Dakin'[name] wet-to-dry dressings and trying to offload the wound bed as best he can. He saw infectious disease on 6/21 and plan is for 2 more weeks of Augmentin and is to follow-up with them as needed. Wound has healthier granulation tissue present today. Overall smaller. 7/12; patient presents for follow-up. He is finished his antibiotics. He is completed 6 weeks. Patient was agreeable with starting a wound VAC. He has an air mattress and was agreeable to going to a group 3. 7/22; patient presents for follow-up. Patient has the wound VAC but has not been started. He has been using Dakin'[name] wet-to-dry dressings. He is not  able to aggressively offload the wound bed. He is in his wheelchair for most of the day. 7/29; patient presents for follow-up. The wound VAC was started 1 week ago and home health is changed the wound VAC. He has no issues or complaints. He has tried to offload the area better. He is receiving his group 3 air mattress this week. He denies systemic signs of infection. Patient History Information obtained from Patient. Family History Diabetes - Mother,Father,Siblings, Hypertension - Mother,Father, No family history of Cancer, Heart Disease, Hereditary Spherocytosis, Kidney Disease, Lung Disease, Seizures, Stroke, Thyroid Problems. Social History Never smoker, Marital Status - Married, Alcohol Use - Rarely, Drug Use - No History, Caffeine Use - Daily. Medical History Eyes Denies history of Cataracts, Glaucoma, Optic Neuritis Ear/Nose/Mouth/Throat Denies history of Chronic sinus problems/congestion, Middle ear problems Hematologic/Lymphatic Denies history of Anemia, Hemophilia, Human Immunodeficiency Virus, Lymphedema, Sickle Cell Disease Respiratory Denies history of Aspiration, Asthma, Chronic Obstructive Pulmonary Disease (COPD), Pneumothorax, Sleep Apnea, Tuberculosis Cardiovascular Patient has history of Hypotension Denies history of Angina, Arrhythmia, Congestive Heart Failure, Coronary Artery Disease, Deep Vein Thrombosis, Hypertension, Myocardial Infarction, Peripheral Arterial Disease, Peripheral Venous Disease, Phlebitis, Vasculitis Gastrointestinal Denies history of Cirrhosis , Colitis, Crohns, Hepatitis A, Hepatitis B, Hepatitis C Endocrine Patient has history of Type II Diabetes - HgbA1c 8.1 07/06/2022 Denies history of Type I Diabetes Genitourinary Denies history of End Stage Renal Disease Immunological Denies history of Lupus Erythematosus, Raynauds, Scleroderma Integumentary (Skin) Denies history of History of Burn Musculoskeletal Patient has history of Osteomyelitis -  left foot 5th toe Denies history of Gout, Rheumatoid Arthritis, Osteoarthritis Neurologic Patient has history of Paraplegia Denies history of Dementia, Neuropathy, Quadriplegia, Seizure Disorder Oncologic Denies history of Received Chemotherapy, Received Radiation Psychiatric Denies history of Anorexia/bulimia, Confinement Anxiety Hospitalization/Surgery History - 07/06/2022 inpatient IandD general surgery; sepsis. 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 - cervical spine surgery. Medical A Surgical History Notes nd Cardiovascular IVF filter Genitourinary neurogenic bowel Neurologic spinal  cord injury T1-T6 Objective Constitutional respirations regular, non-labored and within target range for patient.. Vitals Time Taken: 2:36 PM, Temperature: 98.6 F, Pulse: 80 bpm, Respiratory Rate: 18 breaths/min, Blood Pressure: 118/81 mmHg. [name], [name] ([id number]) [id number] Page 7 of 10 Cardiovascular 2+ dorsalis pedis/posterior tibialis pulses. Psychiatric pleasant and cooperative. General Notes: Sacrum: Large open wound with granulation tissue although not entirely healthy with increased depth that does not probe to bone. No signs of surrounding soft tissue infection. Integumentary (Hair, Skin) Wound #7 status is Open. Original cause of wound was Pressure Injury. The date acquired was: 07/12/2021. The wound has been in treatment 6 weeks. The wound is located on the Sacrum. The wound measures 5.4cm length x 2cm width x 3.5cm depth; 8.482cm^2 area and 29.688cm^3 volume. There is bone, muscle, and Fat Layer (Subcutaneous Tissue) exposed. There is no undermining noted, however, there is tunneling at 6:00 with a maximum distance of 1.5cm. There is a large amount of serosanguineous drainage noted. The wound margin is distinct with the outline attached to the wound base. There is large (67-100%) red, friable granulation within the wound bed. There is a small (1-33%) amount of  necrotic tissue within the wound bed including Adherent Slough. The periwound skin appearance did not exhibit: Callus, Crepitus, Excoriation, Induration, Rash, Scarring, Dry/Scaly, Maceration, Atrophie Blanche, Cyanosis, Ecchymosis, Hemosiderin Staining, Mottled, Pallor, Rubor, Erythema. Periwound temperature was noted as No Abnormality. Assessment Active Problems ICD-10 Pressure ulcer of sacral region, stage 4 Type 2 diabetes mellitus with other skin ulcer Paraplegia, complete Patient'[name] wound has more granulation tissue present. I cannot probe to bone. The wound VAC was replaced today. Continue aggressive offloading. Follow-up in 1 week. Plan Follow-up Appointments: Return Appointment in 1 week. - Dr Mikey Bussing THURSDAY 09/19/2022 215pm ROOM 7 extra time 60 minutes *****HOYER***** Bathing/ Shower/ Hygiene: May shower and wash wound with soap and water. - Dial gold antibacterial soap ( liquid) Negative Presssure Wound Therapy: Wound Vac to wound continuously at 119mm/hg pressure - KCI Black and White Foam combination - *****ensure white foam covers the bone.****** Other: - please bridge to right or left hip. ***Home health to change the wound vac dressing Wednesday and Friday this week.**** Off-Loading: Wound #7 Sacrum: Air fluidized (Group 3) mattress - ordering a group 3 mattress from [employer] Roh[employer] cushion for wheelchair Turn and reposition every 2 hours Additional Orders / Instructions: Follow Nutritious Diet - increase protein Juven Shake 1-2 times daily. Home Health: Wound #7 Sacrum: New wound care orders this week; continue Home Health for wound care. May utilize formulary equivalent dressing for wound treatment orders unless otherwise specified. - ***Home health to change the wound vac dressing Wednesday and Friday this week.**** Next week 09/16/2022 Monday, Wednesday, Friday home health change dressing- wound center will change and apply wet to dry dressing on Thursday at  next week appt 09/19/2022. ENSURE WHITE FOAM COVERS THE BONE!!!!! Dressing changes to be completed by Home Health on Monday / Wednesday / Friday except when patient has scheduled visit at [facility]. Other Home Health Orders/Instructions: - [facility] WOUND #7: - Sacrum Wound Laterality: Cleanser: Wound Cleanser (Home Health) 3 x Per Week/15 Days Discharge Instructions: Cleanse the wound with wound cleanser prior to applying a clean dressing using gauze sponges, not tissue or cotton balls. Peri-Wound Care: Skin Prep (Home Health) 3 x Per Week/15 Days Discharge Instructions: Use skin prep as directed Prim Dressing: wound vac white foam and black foam 3 x Per Week/15 Days ary Discharge Instructions:  white foam cover bone; black foam cover white foam, bridged to left or right hip. 1. Continue wound VAC 2. Follow-up in 1 week Electronic Signature([name]: 09/09/2022 5:19:21 PM By: Geralyn Corwin DO Entered By: Geralyn Corwin on 09/09/2022 15:58:00 [name] ([id number]) [id number] Page 8 of 10 -------------------------------------------------------------------------------- HxROS Details Patient Name: Date of Service: [name] 09/09/2022 2:15 PM Medical Record Number: [id number] Patient Account Number: [id number] Date of Birth/Sex: Treating RN: 26-Oct-1965 (57 y.o. M) Primary Care Provider: Gayleen Orem, Zena Amos Other Clinician: Referring Provider: Treating Provider/Extender: Geralyn Corwin BRO WN, [name] Weeks in Treatment: 6 Information Obtained From Patient Eyes Medical History: Negative for: Cataracts; Glaucoma; Optic Neuritis Ear/Nose/Mouth/Throat Medical History: Negative for: Chronic sinus problems/congestion; Middle ear problems Hematologic/Lymphatic Medical History: Negative for: Anemia; Hemophilia; Human Immunodeficiency Virus; Lymphedema; Sickle Cell Disease Respiratory Medical History: Negative for:  Aspiration; Asthma; Chronic Obstructive Pulmonary Disease (COPD); Pneumothorax; Sleep Apnea; Tuberculosis Cardiovascular Medical History: Positive for: Hypotension Negative for: Angina; Arrhythmia; Congestive Heart Failure; Coronary Artery Disease; Deep Vein Thrombosis; Hypertension; Myocardial Infarction; Peripheral Arterial Disease; Peripheral Venous Disease; Phlebitis; Vasculitis Past Medical History Notes: IVF filter Gastrointestinal Medical History: Negative for: Cirrhosis ; Colitis; Crohns; Hepatitis A; Hepatitis B; Hepatitis C Endocrine Medical History: Positive for: Type II Diabetes - HgbA1c 8.1 07/06/2022 Negative for: Type I Diabetes Time with diabetes: 5 years Treated with: Oral agents Blood sugar tested every day: No Genitourinary Medical History: Negative for: End Stage Renal Disease Past Medical History Notes: neurogenic bowel Immunological Medical History: Negative for: Lupus Erythematosus; Raynauds; Scleroderma Integumentary (Skin) Medical History: Negative for: History of Burn [name], [name] ([id number]) [id number] Page 9 of 10 Musculoskeletal Medical History: Positive for: Osteomyelitis - left foot 5th toe Negative for: Gout; Rheumatoid Arthritis; Osteoarthritis Neurologic Medical History: Positive for: Paraplegia Negative for: Dementia; Neuropathy; Quadriplegia; Seizure Disorder Past Medical History Notes: spinal cord injury T1-T6 Oncologic Medical History: Negative for: Received Chemotherapy; Received Radiation Psychiatric Medical History: Negative for: Anorexia/bulimia; Confinement Anxiety Immunizations Pneumococcal Vaccine: Received Pneumococcal Vaccination: No Implantable Devices None Hospitalization / Surgery History Type of Hospitalization/Surgery 07/06/2022 inpatient IandD general surgery; sepsis 08/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 cervical spine surgery Family and Social History Cancer: No; Diabetes: Yes -  Mother,Father,Siblings; Heart Disease: No; Hereditary Spherocytosis: No; Hypertension: Yes - Mother,Father; Kidney Disease: No; Lung Disease: No; Seizures: No; Stroke: No; Thyroid Problems: No; Never smoker; Marital Status - Married; Alcohol Use: Rarely; Drug Use: No History; Caffeine Use: Daily; Financial Concerns: No; Food, Clothing or Shelter Needs: No; Support System Lacking: No; Transportation Concerns: No Electronic Signature([name]: 09/09/2022 5:19:21 PM By: Geralyn Corwin DO Entered By: Geralyn Corwin on 09/09/2022 15:43:34 -------------------------------------------------------------------------------- SuperBill Details Patient Name: Date of Service: [name], [name] 09/09/2022 Medical Record Number: [id number] Patient Account Number: [id number] Date of Birth/Sex: Treating RN: 06/12/1965 (57 y.o. [name] Primary Care Provider: Gayleen Orem, Zena Amos Other Clinician: Referring Provider: Treating Provider/Extender: Geralyn Corwin BRO WN, [name] Weeks in Treatment: 6 Diagnosis Coding ICD-10 Codes Code Description L89.154 Pressure ulcer of sacral region, stage 4 E11.622 Type 2 diabetes mellitus with other skin ulcer G82.21 Paraplegia, complete Facility Procedures TRENELL, PARROW ([id number]): CPT4 Code Description [id number] [phone] - WOUND VAC-50 SQ CM OR LESS ICD-10 Diagnosis Description L89.154 Pressure ulcer of sacral region, stage 4 E11.622 Type 2 diabetes mellitus with other skin ulcer [id number] Page 10 of 10: Modifier Quantity 1 Physician Procedures : CPT4 Code Description Modifier [id number] - WC PHYS LEVEL 3 - EST PT ICD-10 Diagnosis Description L89.154 Pressure ulcer of sacral  region, stage 4 E11.622 Type 2 diabetes mellitus with other skin ulcer G82.21 Paraplegia, complete Quantity: 1 : [id number] - WC PHYS TX WOUND VAC < 50 SQ CM ICD-10 Diagnosis Description L89.154 Pressure ulcer of sacral region, stage 4 E11.622 Type 2  diabetes mellitus with other skin ulcer Quantity: 1 Electronic Signature([name]: 09/09/2022 5:19:21 PM By: Geralyn Corwin DO Entered By: Geralyn Corwin on 09/09/2022 15:58:12

## 2022-09-10 NOTE — Progress Notes (Signed)
[name] ([id number]) [id number] Page 1 of 7 Visit Report for 09/09/2022 Arrival Information Details Patient Name: Date of Service: [name] 09/09/2022 2:15 PM Medical Record Number: [id number] Patient Account Number: [id number] Date of Birth/Sex: Treating RN: 07-14-1965 (57 y.o. M) Primary Care Rainn Bullinger: Gayleen Orem, Zena Amos Other Clinician: Referring Maicey Barrientez: Treating Navi Erber/Extender: Geralyn Corwin BRO WN, [name] Weeks in Treatment: 6 Visit Information History Since Last Visit Added or deleted any medications: No Patient Arrived: Wheel Chair Any new allergies or adverse reactions: No Arrival Time: 14:34 Had a fall or experienced change in No Accompanied By: wife activities of daily living that may affect Transfer Assistance: Michiel Sites Lift risk of falls: Patient Identification Verified: Yes Signs or symptoms of abuse/neglect since last visito No Secondary Verification Process Completed: Yes Hospitalized since last visit: No Patient Requires Transmission-Based Precautions: No Implantable device outside of the clinic excluding No Patient Has Alerts: No cellular tissue based products placed in the center since last visit: Has Dressing in Place as Prescribed: Yes Pain Present Now: No Electronic Signature(s) Signed: 09/09/2022 4:41:11 PM By: Thayer Dallas Entered By: Thayer Dallas on 09/09/2022 14:35:57 -------------------------------------------------------------------------------- Encounter Discharge Information Details Patient Name: Date of Service: [name], [name] 09/09/2022 2:15 PM Medical Record Number: [id number] Patient Account Number: [id number] Date of Birth/Sex: Treating RN: December 05, 1965 (57 y.o. [name] Primary Care Kaya Pottenger: Kalman Shan Other Clinician: Referring Letia Guidry: Treating Romeo Zielinski/Extender: Geralyn Corwin BRO WN, [name] Weeks in Treatment: 6 Encounter Discharge Information Items Discharge  Condition: Stable Ambulatory Status: Wheelchair Discharge Destination: Home Telephoned: Yes Orders Sent: Yes Transportation: Private Auto Accompanied By: wife Schedule Follow-up Appointment: Yes Clinical Summary of Care: Electronic Signature(s) Signed: 09/09/2022 6:06:14 PM By: Shawn Stall RN, BSN Entered By: Shawn Stall on 09/09/2022 15:07:52 -------------------------------------------------------------------------------- Lower Extremity Assessment Details Patient Name: Date of Service: [name] RLO S 09/09/2022 2:15 PM Medical Record Number: [id number] Patient Account Number: [id number] Date of Birth/Sex: Treating RN: [birth year] (57 y.o. M) Primary Care Oliveah Zwack: Kalman Shan Other Clinician: Referring Yaacov Koziol: Treating Raejean Swinford/Extender: Geralyn Corwin BRO WN, [name] Weeks in Treatment: [address] [place], [name] ([id number]) [id number] Page 2 of 7 Signed: 09/09/2022 4:41:11 PM By: Thayer Dallas Entered By: Thayer Dallas on 09/09/2022 14:36:37 -------------------------------------------------------------------------------- Multi Wound Chart Details Patient Name: Date of Service: [name], [name] 09/09/2022 2:15 PM Medical Record Number: [id number] Patient Account Number: [id number] Date of Birth/Sex: Treating RN: 1965-05-22 (57 y.o. M) Primary Care Treshun Wold: Kalman Shan Other Clinician: Referring Loyda Costin: Treating Kyia Rhude/Extender: Geralyn Corwin BRO WN, [name] Weeks in Treatment: 6 Vital Signs Height(in): Pulse(bpm): 80 Weight(lbs): Blood Pressure(mmHg): 118/81 Body Mass Index(BMI): Temperature(F): 98.6 Respiratory Rate(breaths/min): 18 [7:Photos: Sacrum Wound Location: Pressure Injury Wounding Event: Pressure Ulcer Primary Etiology: Hypotension, Type II Diabetes, Comorbid History: Osteomyelitis, Paraplegia 07/12/2021 Date Acquired: 6 Weeks of Treatment: Open Wound Status: No Wound  Recurrence:  5.4x2x3.5 Measurements L x W x D (cm) 8.482 A (cm) : rea 29.688 Volume (cm) : 87.90% % Reduction in A rea: 87.20% % Reduction in Volume: 6 Position 1 (o'clock): 1.5 Maximum Distance 1 (cm): Yes Tunneling: Category/Stage IV Classification:  Large Exudate A mount: Serosanguineous Exudate Type: red, brown Exudate Color: Distinct, outline attached Wound Margin: Large (67-100%) Granulation A mount: Red, Friable Granulation Quality: Small (1-33%) Necrotic A mount: Fat Layer (Subcutaneous  Tissue): Yes N/A Exposed Structures: Muscle: Yes Bone: Yes Fascia: No Tendon: No Joint: No None Epithelialization: Excoriation: No Periwound Skin Texture: Induration: No Callus: No Crepitus: No Rash: No Scarring:  No Maceration: No Periwound Skin  Moisture: Dry/Scaly: No Atrophie Blanche: No Periwound Skin Color: Cyanosis: No Ecchymosis: No Erythema: No Hemosiderin Staining: No Mottled: No Pallor: No Rubor: No No Abnormality Temperature: Negative Pressure Wound Therapy Procedures Performed:  Maintenance (NPWT)] [N/A:N/A N/A N/A N/A N/A N/A N/A N/A N/A N/A N/A N/A N/A N/A N/A N/A N/A N/A N/A N/A N/A N/A N/A N/A N/A N/A N/A N/A N/A] Treatment Notes Wound #7 Ashok CordiaLonzo Candy ([id number]) [id number] Page 3 of 7 Cleanser Wound Cleanser Discharge Instruction: Cleanse the wound with wound cleanser prior to applying a clean dressing using gauze sponges, not tissue or cotton balls. Peri-Wound Care Skin Prep Discharge Instruction: Use skin prep as directed Topical Primary Dressing wound vac white foam and black foam Discharge Instruction: white foam cover bone; black foam cover white foam, bridged to left or right hip. Secondary Dressing Secured With Compression Wrap Compression Stockings Add-Ons Electronic Signature(s) Signed: 09/09/2022 5:19:21 PM By: Geralyn Corwin DO Entered By: Geralyn Corwin on 09/09/2022  15:42:30 -------------------------------------------------------------------------------- Multi-Disciplinary Care Plan Details Patient Name: Date of Service: [name], [name] 09/09/2022 2:15 PM Medical Record Number: [id number] Patient Account Number: [id number] Date of Birth/Sex: Treating RN: [birth year] (57 y.o. [name] Primary Care Brynlyn Dade: Gayleen Orem, Zena Amos Other Clinician: Referring Janine Reller: Treating Sanders Manninen/Extender: Geralyn Corwin BRO WN, [name] Weeks in Treatment: 6 Active Inactive Pressure Nursing Diagnoses: Knowledge deficit related to causes and risk factors for pressure ulcer development Knowledge deficit related to management of pressures ulcers Goals: Patient will remain free from development of additional pressure ulcers Date Initiated: 07/25/2022 Target Resolution Date: 09/19/2022 Goal Status: Active Patient will remain free of pressure ulcers Date Initiated: 07/25/2022 Target Resolution Date: 09/19/2022 Goal Status: Active Patient/caregiver will verbalize risk factors for pressure ulcer development Date Initiated: 07/25/2022 Target Resolution Date: 09/13/2022 Goal Status: Active Patient/caregiver will verbalize understanding of pressure ulcer management Date Initiated: 07/25/2022 Target Resolution Date: 09/27/2022 Goal Status: Active Interventions: Assess: immobility, friction, shearing, incontinence upon admission and as needed Assess offloading mechanisms upon admission and as needed Assess potential for pressure ulcer upon admission and as needed Provide education on pressure ulcers Notes: Wound/Skin Impairment Nursing DiagnosesNICKO, GILBERTS ([id number]) [id number] Page 4 of 7 Impaired tissue integrity Knowledge deficit related to ulceration/compromised skin integrity Goals: Patient will have a decrease in wound volume by X% from date: (specify in notes) Date Initiated: 07/25/2022 Target Resolution Date:  10/17/2022 Goal Status: Active Patient/caregiver will verbalize understanding of skin care regimen Date Initiated: 07/25/2022 Target Resolution Date: 10/04/2022 Goal Status: Active Ulcer/skin breakdown will have a volume reduction of 30% by week 4 Date Initiated: 07/25/2022 Date Inactivated: 08/23/2022 Target Resolution Date: 08/22/2022 Goal Status: Unmet Unmet Reason: remains the same. Interventions: Assess patient/caregiver ability to obtain necessary supplies Assess patient/caregiver ability to perform ulcer/skin care regimen upon admission and as needed Assess ulceration(s) every visit Provide education on ulcer and skin care Notes: Negative Pressure Wound Therapy Nursing Diagnoses: Knowledge deficit related to use and safety of the device Goals: Patient/caregiver agrees to and verbalizes understanding of need to use Date Initiated: 09/02/2022 Target Resolution Date: 09/20/2022 Goal Status: Active Patient/caregiver will verbalize understanding of use of the device Date Initiated: 09/02/2022 Target Resolution Date: 09/20/2022 Goal Status: Active Nutritional supplements and/or vitamins as prescribed Date Initiated: 09/02/2022 Target Resolution Date: 09/20/2022 Goal Status: Active Interventions: Assess patient nutrition upon admission and as needed per policy Assess patient understanding of disease process and management Monitor and protect skin around the wound Provide education on nutrition Provide  education on use, care, and troubleshooting Treatment Activities: Education provided on Nutrition : 09/02/2022 Referred to DME Slate Debroux for dressing supplies : 09/02/2022 Support surface (Group 2/3) in use : 09/02/2022 Turning and Repositioning schedule in place : 09/02/2022 Notes: Electronic Signature(s) Signed: 09/09/2022 6:06:14 PM By: Shawn Stall RN, BSN Entered By: Shawn Stall on 09/09/2022  14:53:38 -------------------------------------------------------------------------------- Negative Pressure Wound Therapy Maintenance (NPWT) Details Patient Name: Date of Service: [name] 09/09/2022 2:15 PM Medical Record Number: [id number] Patient Account Number: [id number] Date of Birth/Sex: Treating RN: 1965-12-10 (57 y.o. [name] Primary Care Laurine Kuyper: Gayleen Orem, Zena Amos Other Clinician: Referring Norman Piacentini: Treating Corianne Buccellato/Extender: Geralyn Corwin BRO WN, [name] Weeks in Treatment: 6 NPWT Maintenance Performed for: Wound #7 Sacrum Additional Injuries Covered: Yes Performed By: Shawn Stall, RN Type: VAC Freedom System Coverage Size (sq cm): 10.8 Pressure Type: Constant Pressure Setting: 125 mmHG MERYL, AGNER ([id number]) [phone].pdf Page 5 of 7 Drain Type: None Primary Contact: Non-Adherent Sponge/Dressing Type: Foam- Black,Foam- White Date Initiated: 09/02/2022 Dressing Removed: Yes Quantity of Sponges/Gauze Removed: x1 white foam, x1 black foam bridge to hip Canister Changed: Yes Canister Exudate Volume: 150 Dressing Reapplied: Yes Quantity of Sponges/Gauze Inserted: x1 white foam, x1 black foam bridge to hip Respones T Treatment: o tolerated well Days On NPWT : 8 Post Procedure Diagnosis Same as Pre-procedure Notes white foam to to cover bone. Electronic Signature(s) Signed: 09/09/2022 6:06:14 PM By: Shawn Stall RN, BSN Entered By: Shawn Stall on 09/09/2022 15:02:12 -------------------------------------------------------------------------------- Pain Assessment Details Patient Name: Date of Service: [name], [name] 09/09/2022 2:15 PM Medical Record Number: [id number] Patient Account Number: [id number] Date of Birth/Sex: Treating RN: [birth year] (57 y.o. M) Primary Care Kadin Bera: Kalman Shan Other Clinician: Referring Cason Luffman: Treating Hebe Merriwether/Extender: Geralyn Corwin BRO WN, [name] Weeks in  Treatment: 6 Active Problems Location of Pain Severity and Description of Pain Patient Has Paino No Site Locations With Dressing Change: No Character of Pain Pain Management and Medication Current Pain Management: Electronic Signature(s) Signed: 09/09/2022 4:41:11 PM By: Thayer Dallas Entered By: Thayer Dallas on 09/09/2022 14:36:26 Patient/Caregiver Education Details -------------------------------------------------------------------------------- [name] ([id number]) [id number] Page 6 of 7 Patient Name: Date of Service: [name] 7/29/2024andnbsp2:15 PM Medical Record Number: [id number] Patient Account Number: [id number] Date of Birth/Gender: Treating RN: [birth year] (57 y.o. [name] Primary Care Physician: Gayleen Orem, Zena Amos Other Clinician: Referring Physician: Treating Physician/Extender: Jule Ser WN, [name] Weeks in Treatment: 6 Education Assessment Education Provided To: Patient Education Topics Provided Wound/Skin Impairment: Handouts: Caring for Your Ulcer Methods: Explain/Verbal Responses: Reinforcements needed Electronic Signature(s) Signed: 09/09/2022 6:06:14 PM By: Shawn Stall RN, BSN Entered By: Shawn Stall on 09/09/2022 14:54:00 -------------------------------------------------------------------------------- Wound Assessment Details Patient Name: Date of Service: [name], [name] 09/09/2022 2:15 PM Medical Record Number: [id number] Patient Account Number: [id number] Date of Birth/Sex: Treating RN: Apr 06, 1965 (57 y.o. M) Primary Care Deloria Brassfield: Gayleen Orem, Zena Amos Other Clinician: Referring Josslynn Mentzer: Treating Pearley Baranek/Extender: Geralyn Corwin BRO WN, [name] Weeks in Treatment: 6 Wound Status Wound Number: 7 Primary Etiology: Pressure Ulcer Wound Location: Sacrum Wound Status: Open Wounding Event: Pressure Injury Comorbid History: Hypotension, Type II Diabetes, Osteomyelitis,  Paraplegia Date Acquired: 07/12/2021 Weeks Of Treatment: 6 Clustered Wound: No Photos Wound Measurements Length: (cm) 5.4 Width: (cm) 2 Depth: (cm) 3.5 Area: (cm) 8.482 Volume: (cm) 29.688 % Reduction in Area: 87.9% % Reduction in Volume: 87.2% Epithelialization: None Tunneling: Yes Position (o'clock): 6 Maximum Distance: (cm) 1.5 Undermining: No Wound Description Classification: Category/Stage IV Wound  Margin: Distinct, outline attached Exudate Amount: Large Exudate Type: Serosanguineous Exudate Color: red, brown Foul Odor After Cleansing: No Slough/Fibrino Yes Wound Bed Granulation Amount: Large (67-100%) Exposed Structure Granulation Quality: Red, Friable Fascia Exposed: No Necrotic Amount: Small (1-33%) Fat Layer (Subcutaneous Tissue) Exposed: Yes Necrotic Quality: Adherent Slough Tendon Exposed: No Muscle Exposed: Yes Necrosis of Muscle: No Joint Exposed: No Bone ExposedRAGEN, MACARTHUR ([id number]) [id number] Page 7 of 7 Periwound Skin Texture Texture Color No Abnormalities Noted: No No Abnormalities Noted: No Callus: No Atrophie Blanche: No Crepitus: No Cyanosis: No Excoriation: No Ecchymosis: No Induration: No Erythema: No Rash: No Hemosiderin Staining: No Scarring: No Mottled: No Pallor: No Moisture Rubor: No No Abnormalities Noted: No Dry / Scaly: No Temperature / Pain Maceration: No Temperature: No Abnormality Treatment Notes Wound #7 (Sacrum) Cleanser Wound Cleanser Discharge Instruction: Cleanse the wound with wound cleanser prior to applying a clean dressing using gauze sponges, not tissue or cotton balls. Peri-Wound Care Skin Prep Discharge Instruction: Use skin prep as directed Topical Primary Dressing wound vac white foam and black foam Discharge Instruction: white foam cover bone; black foam cover white foam, bridged to left or right hip. Secondary Dressing Secured With Compression  Wrap Compression Stockings Add-Ons Electronic Signature(s) Signed: 09/09/2022 4:41:11 PM By: Thayer Dallas Entered By: Thayer Dallas on 09/09/2022 14:49:34 -------------------------------------------------------------------------------- Vitals Details Patient Name: Date of Service: [name], [name] 09/09/2022 2:15 PM Medical Record Number: [id number] Patient Account Number: [id number] Date of Birth/Sex: Treating RN: 1965-04-22 (57 y.o. M) Primary Care Alesana Magistro: Gayleen Orem, Zena Amos Other Clinician: Referring Para Cossey: Treating Gianella Chismar/Extender: Geralyn Corwin BRO WN, [name] Weeks in Treatment: 6 Vital Signs Time Taken: 14:36 Temperature (F): 98.6 Pulse (bpm): 80 Respiratory Rate (breaths/min): 18 Blood Pressure (mmHg): 118/81 Reference Range: 80 - 120 mg / dl Electronic Signature(s) Signed: 09/09/2022 4:41:11 PM By: Thayer Dallas Entered By: Thayer Dallas on 09/09/2022 14:36:18

## 2022-09-11 ENCOUNTER — Encounter: Payer: Self-pay | Admitting: Urology

## 2022-09-11 ENCOUNTER — Ambulatory Visit: Payer: 59 | Admitting: Urology

## 2022-09-19 ENCOUNTER — Ambulatory Visit (HOSPITAL_BASED_OUTPATIENT_CLINIC_OR_DEPARTMENT_OTHER): Payer: 59 | Admitting: Internal Medicine

## 2022-09-26 ENCOUNTER — Encounter (HOSPITAL_BASED_OUTPATIENT_CLINIC_OR_DEPARTMENT_OTHER): Payer: 59 | Attending: Internal Medicine | Admitting: Internal Medicine

## 2022-09-26 DIAGNOSIS — M4628 Osteomyelitis of vertebra, sacral and sacrococcygeal region: Secondary | ICD-10-CM | POA: Diagnosis not present

## 2022-09-26 DIAGNOSIS — S31501A Unspecified open wound of unspecified external genital organs, male, initial encounter: Secondary | ICD-10-CM | POA: Insufficient documentation

## 2022-09-26 DIAGNOSIS — E11622 Type 2 diabetes mellitus with other skin ulcer: Secondary | ICD-10-CM | POA: Diagnosis not present

## 2022-09-26 DIAGNOSIS — L89154 Pressure ulcer of sacral region, stage 4: Secondary | ICD-10-CM | POA: Diagnosis not present

## 2022-09-26 DIAGNOSIS — G8221 Paraplegia, complete: Secondary | ICD-10-CM | POA: Diagnosis not present

## 2022-09-26 DIAGNOSIS — X58XXXA Exposure to other specified factors, initial encounter: Secondary | ICD-10-CM | POA: Insufficient documentation

## 2022-09-26 DIAGNOSIS — E1169 Type 2 diabetes mellitus with other specified complication: Secondary | ICD-10-CM | POA: Insufficient documentation

## 2022-09-26 DIAGNOSIS — Z794 Long term (current) use of insulin: Secondary | ICD-10-CM | POA: Diagnosis not present

## 2022-09-27 NOTE — Progress Notes (Signed)
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 fifth metatarsal head is closed there is no other open wound. We did not look at his right leg but per his wife who is usually fairly accurate everything is closed. 1/21; I thought I be able to discharge this patient today. When he was here 2 weeks ago he had 1 remaining wound on the left posterior lateral calf, predictably this is closed today HOWEVER he has a reopening of the area on the plantar left fifth metatarsal head. In a paraplegic these are the classic pressure area openings and when he came here he had left and right pressure ulcers in these areas. He wears POTUS boots but it is possible that he is putting too much pressure on the wheelchair foot rest. ALSO he has really poorly controlled swelling in the left leg, he had his own compression stockings on from elastic therapy in [place] 1/28; predictably his left foot is closed the edema control in his left leg is substantially better. There is however nothing but skin over the fifth metatarsal head in the area of this recurrent wound. He has 20/30 below-knee stockings 07/25/2022 Mr. 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 He visited the [place], [place] ([id number]) [id number] Page 6 of 10 ED on 6/24 and was admitted for sepsis secondary to sacral wound infection. He had a CT abdomen pelvis that did not show evidence of osteomyelitis but did suggest infection of the subcutaneous tissue on 5/25 he had irrigation and debridement of the sacral wound by Dr. Janee Morn with general surgery. He has been using saline wet-to-dry dressings since the surgery. Infectious disease recommended 4 weeks of oral antibiotics including Augmentin and doxycycline. While in the hospital he was on IV antibiotics for about 1 week. He has follow-up with infectious disease on 6/21. He was given an air mattress post discharge. He has home health that comes in weekly to help with assessment and dressing changes. Currently patient denies systemic signs of infection. 6/27; patient presents for follow-up. Patient's been using Dakin's wet-to-dry dressings and trying to offload the wound bed as best he can. He saw infectious disease on 6/21 and plan is for 2 more weeks of Augmentin and is to follow-up with them as needed. Wound has healthier granulation tissue present today. Overall smaller. 7/12; patient presents for follow-up. He is finished his antibiotics. He is completed 6 weeks. Patient was agreeable with starting a wound VAC. He has an air mattress and was agreeable to going to a group 3. 7/22; patient presents for follow-up. Patient has the wound VAC but has not been started. He has been using Dakin's wet-to-dry dressings. He is not able to aggressively offload the wound bed. He is in his wheelchair for most of the day. 7/29; patient presents for follow-up.  The wound VAC was started 1 week ago and home health is changed the wound VAC. He has no issues or complaints. He has tried to offload the area better. He is receiving his group 3 air mattress this week. He denies systemic signs of infection. 8/15; patient presents for follow-up. He has been using the wound VAC however has had difficulty keeping a seal. There has been drainage outside of the wound VAC. He has been using his group 3 air mattress without issues. He currently denies systemic signs of infection. Patient History Information obtained from Patient. Family History Diabetes - Mother,Father,Siblings, Hypertension - Mother,Father, No family history of Cancer, Heart Disease, Hereditary Spherocytosis, Kidney Disease, Lung Disease, Seizures, Stroke, Thyroid Problems. Social History Never smoker, Marital Status - Married, Alcohol Use - Rarely, Drug Use - No History, Caffeine Use - Daily. Medical History Eyes Denies history of Cataracts, Glaucoma, Optic Neuritis Ear/Nose/Mouth/Throat Denies history of Chronic sinus problems/congestion, Middle ear problems Hematologic/Lymphatic Denies history of Anemia, Hemophilia, Human Immunodeficiency Virus, Lymphedema, Sickle Cell Disease Respiratory Denies history of Aspiration, Asthma, Chronic Obstructive Pulmonary Disease (COPD), Pneumothorax, Sleep Apnea, Tuberculosis Cardiovascular Patient has history of Hypotension Denies history of Angina, Arrhythmia, Congestive Heart Failure, Coronary Artery Disease, Deep Vein Thrombosis, Hypertension, Myocardial Infarction, Peripheral Arterial Disease, Peripheral Venous Disease, Phlebitis, Vasculitis Gastrointestinal Denies history of Cirrhosis , Colitis, Crohns, Hepatitis A, Hepatitis B, Hepatitis C Endocrine Patient has history of Type II Diabetes - HgbA1c 8.1 07/06/2022 Denies history of Type I Diabetes Genitourinary Denies history of End Stage Renal Disease Immunological Denies history of Lupus  Erythematosus, Raynauds, Scleroderma Integumentary (Skin) Denies history of History of Burn Musculoskeletal Patient has history of Osteomyelitis - left foot 5th toe Denies history of Gout, Rheumatoid Arthritis, Osteoarthritis Neurologic Patient has history of Paraplegia Denies history of Dementia, Neuropathy, Quadriplegia, Seizure Disorder Oncologic Denies history of Received Chemotherapy, Received Radiation  Psychiatric Denies history of Anorexia/bulimia, Confinement Anxiety Hospitalization/Surgery History - 07/06/2022 inpatient IandD general surgery; sepsis. 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 - cervical spine surgery. Medical A Surgical History Notes nd Cardiovascular IVF filter Genitourinary neurogenic bowel Neurologic spinal cord injury T1-T6 Objective Constitutional TRAYLON, OTTENS ([id number]) [id number] Page 7 of 10 respirations regular, non-labored and within target range for patient.. Vitals Time Taken: 12:40 PM, Temperature: 98.4 F, Pulse: 75 bpm, Respiratory Rate: 20 breaths/min, Blood Pressure: 104/69 mmHg. Psychiatric pleasant and cooperative. General Notes: Sacrum: Large open wound with granulation tissue although not entirely healthy with increased depth that Probes close to bone. No signs of surrounding soft tissue infection. Integumentary (Hair, Skin) Wound #7 status is Open. Original cause of wound was Pressure Injury. The date acquired was: 07/12/2021. The wound has been in treatment 9 weeks. The wound is located on the Sacrum. The wound measures 5cm length x 2cm width x 5cm depth; 7.854cm^2 area and 39.27cm^3 volume. There is bone, muscle, and Fat Layer (Subcutaneous Tissue) exposed. There is no undermining noted, however, there is tunneling at 9:00 with a maximum distance of 4.5cm. There is a large amount of serosanguineous drainage noted. The wound margin is distinct with the outline attached to the wound base. There is no granulation  within the wound bed. There is a large (67-100%) amount of necrotic tissue within the wound bed including Adherent Slough and Necrosis of Muscle. The periwound skin appearance did not exhibit: Callus, Crepitus, Excoriation, Induration, Rash, Scarring, Dry/Scaly, Maceration, Atrophie Blanche, Cyanosis, Ecchymosis, Hemosiderin Staining, Mottled, Pallor, Rubor, Erythema. Periwound temperature was noted as No Abnormality. Assessment Active Problems ICD-10 Pressure ulcer of sacral region, stage 4 Type 2 diabetes mellitus with other skin ulcer Paraplegia, complete Patient's wound has more nonviable tissue at the base. Unfortunately the wound VAC is not working well for the patient and I recommended discontinuing this. No signs of acute active infection. I recommend at this time Dakin's wet-to-dry dressings and aggressive offloading. He has a group 3 air mattress now. Continue increasing protein intake. Follow-up in 2 weeks. Plan Follow-up Appointments: Return Appointment in 2 weeks. - Dr Mikey Bussing Tuesday 10/08/2022 245pm ROOM 8 extra time 60 minutes *****HOYER***** Bathing/ Shower/ Hygiene: May shower and wash wound with soap and water. - Dial gold antibacterial soap ( liquid) Negative Presssure Wound Therapy: Discontinue wound vac. Call the number on the machine for the company to pick up. Off-Loading: Wound #7 Sacrum: Air fluidized (Group 3) mattress - Continue to use group 3 mattress from [employer] cushion for wheelchair Turn and reposition every 2 hours Additional Orders / Instructions: Follow Nutritious Diet - increase protein Juven Shake 1-2 times daily. Home Health: Wound #7 Sacrum: New wound care orders this week; continue Home Health for wound care. May utilize formulary equivalent dressing for wound treatment orders unless otherwise specified. - home health to change weekly. dakin's wet to dry with abd pad to cover. family to change all other days. Other Home Health  Orders/Instructions: - [facility] WOUND #7: - Sacrum Wound Laterality: Cleanser: Wound Cleanser (Home Health) 1 x Per Day/30 Days Discharge Instructions: Cleanse the wound with wound cleanser prior to applying a clean dressing using gauze sponges, not tissue or cotton balls. Peri-Wound Care: Skin Prep (Home Health) 1 x Per Day/30 Days Discharge Instructions: Use skin prep as directed Prim Dressing: Dakin's Solution 0.25%, 16 (oz) 1 x Per Day/30 Days ary Discharge Instructions: Moisten gauze with Dakin's solution Secondary Dressing: ABD Pad, 8x10 1 x Per Day/30  Days Discharge Instructions: Apply over primary dressing as directed. Secured With: 62M Medipore H Soft Cloth Surgical T ape, 4 x 10 (in/yd) 1 x Per Day/30 Days Discharge Instructions: Secure with tape as directed. 1. Dakin's wet-to-dry dressings 2. Aggressive offloading 3. Follow-up in 2 weeks 4. Discontinue wound VAC Electronic Signature(s) Signed: 09/26/2022 4:25:59 PM By: Geralyn Corwin DO Entered By: Geralyn Corwin on 09/26/2022 13:26:16 [name] ([id number]) [id number] Page 8 of 10 -------------------------------------------------------------------------------- HxROS Details Patient Name: Date of Service: [name] 09/26/2022 12:30 PM Medical Record Number: [id number] Patient Account Number: [id number] Date of Birth/Sex: Treating RN: [birth year] (57 y.o. M) Primary Care Provider: Gayleen Orem, Zena Amos Other Clinician: Referring Provider: Treating Provider/Extender: Geralyn Corwin BRO WN, [name] Weeks in Treatment: 9 Information Obtained From Patient Eyes Medical History: Negative for: Cataracts; Glaucoma; Optic Neuritis Ear/Nose/Mouth/Throat Medical History: Negative for: Chronic sinus problems/congestion; Middle ear problems Hematologic/Lymphatic Medical History: Negative for: Anemia; Hemophilia; Human Immunodeficiency Virus; Lymphedema; Sickle Cell  Disease Respiratory Medical History: Negative for: Aspiration; Asthma; Chronic Obstructive Pulmonary Disease (COPD); Pneumothorax; Sleep Apnea; Tuberculosis Cardiovascular Medical History: Positive for: Hypotension Negative for: Angina; Arrhythmia; Congestive Heart Failure; Coronary Artery Disease; Deep Vein Thrombosis; Hypertension; Myocardial Infarction; Peripheral Arterial Disease; Peripheral Venous Disease; Phlebitis; Vasculitis Past Medical History Notes: IVF filter Gastrointestinal Medical History: Negative for: Cirrhosis ; Colitis; Crohns; Hepatitis A; Hepatitis B; Hepatitis C Endocrine Medical History: Positive for: Type II Diabetes - HgbA1c 8.1 07/06/2022 Negative for: Type I Diabetes Time with diabetes: 5 years Treated with: Oral agents Blood sugar tested every day: No Genitourinary Medical History: Negative for: End Stage Renal Disease Past Medical History Notes: neurogenic bowel Immunological Medical History: Negative for: Lupus Erythematosus; Raynauds; Scleroderma Integumentary (Skin) Medical HistorySULAIMAN, FINTEL ([id number]) [id number] Page 9 of 10 Negative for: History of Burn Musculoskeletal Medical History: Positive for: Osteomyelitis - left foot 5th toe Negative for: Gout; Rheumatoid Arthritis; Osteoarthritis Neurologic Medical History: Positive for: Paraplegia Negative for: Dementia; Neuropathy; Quadriplegia; Seizure Disorder Past Medical History Notes: spinal cord injury T1-T6 Oncologic Medical History: Negative for: Received Chemotherapy; Received Radiation Psychiatric Medical History: Negative for: Anorexia/bulimia; Confinement Anxiety Immunizations Pneumococcal Vaccine: Received Pneumococcal Vaccination: No Implantable Devices None Hospitalization / Surgery History Type of Hospitalization/Surgery 07/06/2022 inpatient IandD general surgery; sepsis 08/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 cervical spine surgery Family  and Social History Cancer: No; Diabetes: Yes - Mother,Father,Siblings; Heart Disease: No; Hereditary Spherocytosis: No; Hypertension: Yes - Mother,Father; Kidney Disease: No; Lung Disease: No; Seizures: No; Stroke: No; Thyroid Problems: No; Never smoker; Marital Status - Married; Alcohol Use: Rarely; Drug Use: No History; Caffeine Use: Daily; Financial Concerns: No; Food, Clothing or Shelter Needs: No; Support System Lacking: No; Transportation Concerns: No Electronic Signature(s) Signed: 09/26/2022 4:25:59 PM By: Geralyn Corwin DO Entered By: Geralyn Corwin on 09/26/2022 13:23:06 -------------------------------------------------------------------------------- SuperBill Details Patient Name: Date of Service: [name], [name] 09/26/2022 Medical Record Number: [id number] Patient Account Number: [id number] Date of Birth/Sex: Treating RN: 1965-07-02 (57 y.o. [name] Primary Care Provider: Gayleen Orem, Zena Amos Other Clinician: Referring Provider: Treating Provider/Extender: Geralyn Corwin BRO WN, [name] Weeks in Treatment: 9 Diagnosis Coding ICD-10 Codes Code Description L89.154 Pressure ulcer of sacral region, stage 4 E11.622 Type 2 diabetes mellitus with other skin ulcer G82.21 Paraplegia, complete KAYIN, MIZER ([id number]) [id number] Page 10 of 10 Facility Procedures : CPT4 Code: [id number] Description: 99214 - WOUND CARE VISIT-LEV 4 EST PT Modifier: Quantity: 1 Physician Procedures : CPT4 Code Description Modifier [id number] - WC PHYS LEVEL 3 - EST PT  ICD-10 Diagnosis Description L89.154 Pressure ulcer of sacral region, stage 4 E11.622 Type 2 diabetes mellitus with other skin ulcer G82.21 Paraplegia, complete Quantity: 1 Electronic Signature(s) Signed: 09/26/2022 4:25:59 PM By: Geralyn Corwin DO Entered By: Geralyn Corwin on 09/26/2022 13:26:25

## 2022-09-27 NOTE — Progress Notes (Signed)
SKILAR, LEFRANCOIS ([id number]) [id number] Page 1 of 9 Visit Report for 09/26/2022 Arrival Information Details Patient Name: Date of Service: [name] 09/26/2022 12:30 PM Medical Record Number: [id number] Patient Account Number: [id number] Date of Birth/Sex: Treating RN: 1965-04-25 (58 y.o. [name] Primary Care Nephtali Docken: Gayleen Orem, Zena Amos Other Clinician: Referring Korayma Hagwood: Treating Alonzo Loving/Extender: Geralyn Corwin BRO WN, [name] Weeks in Treatment: 9 Visit Information History Since Last Visit Added or deleted any medications: No Patient Arrived: Wheel Chair Any new allergies or adverse reactions: No Arrival Time: 12:40 Had a fall or experienced change in No Accompanied By: wife activities of daily living that may affect Transfer Assistance: Michiel Sites Lift risk of falls: Patient Identification Verified: Yes Signs or symptoms of abuse/neglect since last visito No Secondary Verification Process Completed: Yes Hospitalized since last visit: No Patient Requires Transmission-Based Precautions: No Implantable device outside of the clinic excluding No Patient Has Alerts: No cellular tissue based products placed in the center since last visit: Has Dressing in Place as Prescribed: Yes Pain Present Now: No Electronic Signature(s) Signed: 09/26/2022 5:38:18 PM By: Shawn Stall RN, BSN Entered By: Shawn Stall on 09/26/2022 12:40:57 -------------------------------------------------------------------------------- Clinic Level of Care Assessment Details Patient Name: Date of Service: [name] 09/26/2022 12:30 PM Medical Record Number: [id number] Patient Account Number: [id number] Date of Birth/Sex: Treating RN: [birth year] (57 y.o. [name], Millard.Loa Primary Care Siedah Sedor: Gayleen Orem, Zena Amos Other Clinician: Referring Mynor Witkop: Treating Amish Mintzer/Extender: Geralyn Corwin BRO WN, [name] Weeks in Treatment: 9 Clinic Level of Care  Assessment Items TOOL 4 Quantity Score X- 1 0 Use when only an EandM is performed on FOLLOW-UP visit ASSESSMENTS - Nursing Assessment / Reassessment X- 1 10 Reassessment of Co-morbidities (includes updates in patient status) X- 1 5 Reassessment of Adherence to Treatment Plan ASSESSMENTS - Wound and Skin A ssessment / Reassessment X - Simple Wound Assessment / Reassessment - one wound 1 5 []  - 0 Complex Wound Assessment / Reassessment - multiple wounds X- 1 10 Dermatologic / Skin Assessment (not related to wound area) ASSESSMENTS - Focused Assessment []  - 0 Circumferential Edema Measurements - multi extremities X- 1 10 Nutritional Assessment / Counseling / Intervention TYRION, TORNATORE ([id number]) [id number] Page 2 of 9 []  - 0 Lower Extremity Assessment (monofilament, tuning fork, pulses) []  - 0 Peripheral Arterial Disease Assessment (using hand held doppler) ASSESSMENTS - Ostomy and/or Continence Assessment and Care []  - 0 Incontinence Assessment and Management []  - 0 Ostomy Care Assessment and Management (repouching, etc.) PROCESS - Coordination of Care X - Simple Patient / Family Education for ongoing care 1 15 []  - 0 Complex (extensive) Patient / Family Education for ongoing care X- 1 10 Staff obtains Chiropractor, Records, T Results / Process Orders est X- 1 10 Staff telephones HHA, Nursing Homes / Clarify orders / etc []  - 0 Routine Transfer to another Facility (non-emergent condition) []  - 0 Routine Hospital Admission (non-emergent condition) []  - 0 New Admissions / Manufacturing engineer / Ordering NPWT Apligraf, etc. , []  - 0 Emergency Hospital Admission (emergent condition) X- 1 10 Simple Discharge Coordination []  - 0 Complex (extensive) Discharge Coordination PROCESS - Special Needs []  - 0 Pediatric / Minor Patient Management []  - 0 Isolation Patient Management []  - 0 Hearing / Language / Visual special needs []  -  0 Assessment of Community assistance (transportation, D/C planning, etc.) []  - 0 Additional assistance / Altered mentation X- 1 15 Support Surface(s) Assessment (bed, cushion, seat, etc.) INTERVENTIONS - Wound Cleansing /  Measurement X - Simple Wound Cleansing - one wound 1 5 []  - 0 Complex Wound Cleansing - multiple wounds X- 1 5 Wound Imaging (photographs - any number of wounds) []  - 0 Wound Tracing (instead of photographs) X- 1 5 Simple Wound Measurement - one wound []  - 0 Complex Wound Measurement - multiple wounds INTERVENTIONS - Wound Dressings []  - 0 Small Wound Dressing one or multiple wounds X- 1 15 Medium Wound Dressing one or multiple wounds []  - 0 Large Wound Dressing one or multiple wounds []  - 0 Application of Medications - topical []  - 0 Application of Medications - injection INTERVENTIONS - Miscellaneous []  - 0 External ear exam []  - 0 Specimen Collection (cultures, biopsies, blood, body fluids, etc.) []  - 0 Specimen(s) / Culture(s) sent or taken to Lab for analysis X- 1 10 Patient Transfer (multiple staff / Nurse, adult / Similar devices) []  - 0 Simple Staple / Suture removal (25 or less) []  - 0 Complex Staple / Suture removal (26 or more) []  - 0 Hypo / Hyperglycemic Management (close monitor of Blood Glucose) TYLIQUE, CIRILLO ([id number]) [id number] Page 3 of 9 []  - 0 Ankle / Brachial Index (ABI) - do not check if billed separately X- 1 5 Vital Signs Has the patient been seen at the hospital within the last three years: Yes Total Score: 145 Level Of Care: New/Established - Level 4 Electronic Signature(s) Signed: 09/26/2022 5:38:18 PM By: Shawn Stall RN, BSN Entered By: Shawn Stall on 09/26/2022 13:24:53 -------------------------------------------------------------------------------- Encounter Discharge Information Details Patient Name: Date of Service: [name], [name] 09/26/2022 12:30 PM Medical Record Number:  [id number] Patient Account Number: [id number] Date of Birth/Sex: Treating RN: 03-20-1965 (57 y.o. [name] Primary Care Youssouf Shipley: Gayleen Orem, Zena Amos Other Clinician: Referring Wilberth Damon: Treating Finnlee Silvernail/Extender: Geralyn Corwin BRO WN, [name] Weeks in Treatment: 9 Encounter Discharge Information Items Discharge Condition: Stable Ambulatory Status: Wheelchair Discharge Destination: Home Transportation: Private Auto Accompanied By: wife Schedule Follow-up Appointment: Yes Clinical Summary of Care: Electronic Signature(s) Signed: 09/26/2022 5:38:18 PM By: Shawn Stall RN, BSN Entered By: Shawn Stall on 09/26/2022 13:25:33 -------------------------------------------------------------------------------- Lower Extremity Assessment Details Patient Name: Date of Service: [name], [name] 09/26/2022 12:30 PM Medical Record Number: [id number] Patient Account Number: [id number] Date of Birth/Sex: Treating RN: 1965-07-27 (57 y.o. [name] Primary Care Elease Swarm: Gayleen Orem, Zena Amos Other Clinician: Referring Alaylah Heatherington: Treating Aubreana Cornacchia/Extender: Geralyn Corwin BRO WN, [name] Weeks in Treatment: 9 Electronic Signature(s) Signed: 09/26/2022 5:38:18 PM By: Shawn Stall RN, BSN Entered By: Shawn Stall on 09/26/2022 12:41:21 -------------------------------------------------------------------------------- Multi Wound Chart Details Patient Name: Date of Service: [name], [name] 09/26/2022 12:30 PM Medical Record Number: [id number] Patient Account Number: [id number] ATHOL, POLLAK ([id number]) [id number] Page 4 of 9 Date of Birth/Sex: Treating RN: [birth year] (57 y.o. M) Primary Care Liliani Bobo: Other Clinician: Kalman Shan Referring Shafiq Larch: Treating Willian Donson/Extender: Geralyn Corwin BRO WN, [name] Weeks in Treatment: 9 Vital Signs Height(in): Pulse(bpm): 75 Weight(lbs): Blood Pressure(mmHg): 104/69 Body Mass  Index(BMI): Temperature(F): 98.4 Respiratory Rate(breaths/min): 20 [7:Photos:] [N/A:N/A] Sacrum N/A N/A Wound Location: Pressure Injury N/A N/A Wounding Event: Pressure Ulcer N/A N/A Primary Etiology: Hypotension, Type II Diabetes, N/A N/A Comorbid History: Osteomyelitis, Paraplegia 07/12/2021 N/A N/A Date Acquired: 9 N/A N/A Weeks of Treatment: Open N/A N/A Wound Status: No N/A N/A Wound Recurrence: 5x2x5 N/A N/A Measurements L x W x D (cm) 7.854 N/A N/A A (cm) : rea 39.27 N/A N/A Volume (cm) : 88.80% N/A N/A % Reduction in A rea:  83.00% N/A N/A % Reduction in Volume: 9 Position 1 (o'clock): 4.5 Maximum Distance 1 (cm): Yes N/A N/A Tunneling: Category/Stage IV N/A N/A Classification: Large N/A N/A Exudate A mount: Serosanguineous N/A N/A Exudate Type: red, brown N/A N/A Exudate Color: Distinct, outline attached N/A N/A Wound Margin: None Present (0%) N/A N/A Granulation A mount: Large (67-100%) N/A N/A Necrotic A mount: Fat Layer (Subcutaneous Tissue): Yes N/A N/A Exposed Structures: Muscle: Yes Bone: Yes Fascia: No Tendon: No Joint: No None N/A N/A Epithelialization: Excoriation: No N/A N/A Periwound Skin Texture: Induration: No Callus: No Crepitus: No Rash: No Scarring: No Maceration: No N/A N/A Periwound Skin Moisture: Dry/Scaly: No Atrophie Blanche: No N/A N/A Periwound Skin Color: Cyanosis: No Ecchymosis: No Erythema: No Hemosiderin Staining: No Mottled: No Pallor: No Rubor: No No Abnormality N/A N/A Temperature: Treatment Notes Electronic Signature(s) Signed: 09/26/2022 4:25:59 PM By: Geralyn Corwin DO Entered By: Geralyn Corwin on 09/26/2022 13:22:04 [name] ([id number]) [id number] Page 5 of 9 -------------------------------------------------------------------------------- Multi-Disciplinary Care Plan Details Patient Name: Date of Service: [name] 09/26/2022 12:30 PM Medical  Record Number: [id number] Patient Account Number: [id number] Date of Birth/Sex: Treating RN: 03/23/1965 (57 y.o. [name] Primary Care Jaquavian Firkus: Gayleen Orem, Zena Amos Other Clinician: Referring Shonta Bourque: Treating Kylon Philbrook/Extender: Geralyn Corwin BRO WN, [name] Weeks in Treatment: 9 Active Inactive Pressure Nursing Diagnoses: Knowledge deficit related to causes and risk factors for pressure ulcer development Knowledge deficit related to management of pressures ulcers Goals: Patient will remain free from development of additional pressure ulcers Date Initiated: 07/25/2022 Target Resolution Date: 11/01/2022 Goal Status: Active Patient will remain free of pressure ulcers Date Initiated: 07/25/2022 Target Resolution Date: 11/01/2022 Goal Status: Active Patient/caregiver will verbalize risk factors for pressure ulcer development Date Initiated: 07/25/2022 Target Resolution Date: 11/01/2022 Goal Status: Active Patient/caregiver will verbalize understanding of pressure ulcer management Date Initiated: 07/25/2022 Target Resolution Date: 11/01/2022 Goal Status: Active Interventions: Assess: immobility, friction, shearing, incontinence upon admission and as needed Assess offloading mechanisms upon admission and as needed Assess potential for pressure ulcer upon admission and as needed Provide education on pressure ulcers Notes: Wound/Skin Impairment Nursing Diagnoses: Impaired tissue integrity Knowledge deficit related to ulceration/compromised skin integrity Goals: Patient will have a decrease in wound volume by X% from date: (specify in notes) Date Initiated: 07/25/2022 Target Resolution Date: 12/05/2022 Goal Status: Active Patient/caregiver will verbalize understanding of skin care regimen Date Initiated: 07/25/2022 Target Resolution Date: 11/07/2022 Goal Status: Active Ulcer/skin breakdown will have a volume reduction of 30% by week 4 Date Initiated: 07/25/2022 Date  Inactivated: 08/23/2022 Target Resolution Date: 08/22/2022 Goal Status: Unmet Unmet Reason: remains the same. Interventions: Assess patient/caregiver ability to obtain necessary supplies Assess patient/caregiver ability to perform ulcer/skin care regimen upon admission and as needed Assess ulceration(s) every visit Provide education on ulcer and skin care Notes: Negative Pressure Wound Therapy Nursing Diagnoses: Knowledge deficit related to use and safety of the device [place], [name] ([id number]) [id number] Page 6 of 9 Goals: Patient/caregiver agrees to and verbalizes understanding of need to use Date Initiated: 09/02/2022 Date Inactivated: 09/26/2022 Target Resolution Date: 09/26/2022 Goal Status: Met Patient/caregiver will verbalize understanding of use of the device Date Initiated: 09/02/2022 Date Inactivated: 09/26/2022 Target Resolution Date: 09/26/2022 Goal Status: Met Nutritional supplements and/or vitamins as prescribed Date Initiated: 09/02/2022 Target Resolution Date: 10/11/2022 Goal Status: Active Interventions: Assess patient nutrition upon admission and as needed per policy Assess patient understanding of disease process and management Monitor and protect skin around the wound Provide education on nutrition  Provide education on use, care, and troubleshooting Treatment Activities: Education provided on Nutrition : 09/02/2022 Referred to DME Ulrick Methot for dressing supplies : 09/02/2022 Support surface (Group 2/3) in use : 09/02/2022 Turning and Repositioning schedule in place : 09/02/2022 Notes: Electronic Signature(s) Signed: 09/26/2022 5:38:18 PM By: Shawn Stall RN, BSN Entered By: Shawn Stall on 09/26/2022 12:42:31 -------------------------------------------------------------------------------- Pain Assessment Details Patient Name: Date of Service: [name], [name] 09/26/2022 12:30 PM Medical Record Number: [id number] Patient Account  Number: [id number] Date of Birth/Sex: Treating RN: 01-13-1966 (57 y.o. [name] Primary Care Aolanis Crispen: Gayleen Orem, Zena Amos Other Clinician: Referring Galdino Hinchman: Treating Altamese Deguire/Extender: Geralyn Corwin BRO WN, [name] Weeks in Treatment: 9 Active Problems Location of Pain Severity and Description of Pain Patient Has Paino No Site Locations Pain Management and Medication Current Pain Management: DANE, MIHALICK ([id number]) [id number] Page 7 of 9 Electronic Signature(s) Signed: 09/26/2022 5:38:18 PM By: Shawn Stall RN, BSN Entered By: Shawn Stall on 09/26/2022 12:41:14 -------------------------------------------------------------------------------- Patient/Caregiver Education Details Patient Name: Date of Service: [name] 8/15/2024andnbsp12:30 PM Medical Record Number: [id number] Patient Account Number: [id number] Date of Birth/Gender: Treating RN: 11-11-1965 (57 y.o. [name] Primary Care Physician: Gayleen Orem, Zena Amos Other Clinician: Referring Physician: Treating Physician/Extender: Jule Ser WN, [name] Weeks in Treatment: 9 Education Assessment Education Provided To: Patient Education Topics Provided Wound/Skin Impairment: Handouts: Caring for Your Ulcer Methods: Explain/Verbal Responses: Reinforcements needed Electronic Signature(s) Signed: 09/26/2022 5:38:18 PM By: Shawn Stall RN, BSN Entered By: Shawn Stall on 09/26/2022 12:42:45 -------------------------------------------------------------------------------- Wound Assessment Details Patient Name: Date of Service: [name], [name] 09/26/2022 12:30 PM Medical Record Number: [id number] Patient Account Number: [id number] Date of Birth/Sex: Treating RN: [birth year] (57 y.o. [name] Primary Care Julanne Schlueter: Gayleen Orem, Zena Amos Other Clinician: Referring Monterius Rolf: Treating Maicie Vanderloop/Extender: Geralyn Corwin BRO WN, [name] Weeks in Treatment:  9 Wound Status Wound Number: 7 Primary Etiology: Pressure Ulcer Wound Location: Sacrum Wound Status: Open Wounding Event: Pressure Injury Comorbid History: Hypotension, Type II Diabetes, Osteomyelitis, Paraplegia Date Acquired: 07/12/2021 Weeks Of Treatment: 9 Clustered Wound: No Photos CLIMMIE, SLOVICK ([id number]) [id number] Page 8 of 9 Wound Measurements Length: (cm) 5 Width: (cm) 2 Depth: (cm) 5 Area: (cm) 7.854 Volume: (cm) 39.27 % Reduction in Area: 88.8% % Reduction in Volume: 83% Epithelialization: None Tunneling: Yes Position (o'clock): 9 Maximum Distance: (cm) 4.5 Undermining: No Wound Description Classification: Category/Stage IV Wound Margin: Distinct, outline attached Exudate Amount: Large Exudate Type: Serosanguineous Exudate Color: red, brown Foul Odor After Cleansing: No Slough/Fibrino Yes Wound Bed Granulation Amount: None Present (0%) Exposed Structure Necrotic Amount: Large (67-100%) Fascia Exposed: No Necrotic Quality: Adherent Slough Fat Layer (Subcutaneous Tissue) Exposed: Yes Tendon Exposed: No Muscle Exposed: Yes Necrosis of Muscle: Yes Joint Exposed: No Bone Exposed: Yes Periwound Skin Texture Texture Color No Abnormalities Noted: No No Abnormalities Noted: No Callus: No Atrophie Blanche: No Crepitus: No Cyanosis: No Excoriation: No Ecchymosis: No Induration: No Erythema: No Rash: No Hemosiderin Staining: No Scarring: No Mottled: No Pallor: No Moisture Rubor: No No Abnormalities Noted: No Dry / Scaly: No Temperature / Pain Maceration: No Temperature: No Abnormality Treatment Notes Wound #7 (Sacrum) Cleanser Wound Cleanser Discharge Instruction: Cleanse the wound with wound cleanser prior to applying a clean dressing using gauze sponges, not tissue or cotton balls. Peri-Wound Care Skin Prep Discharge Instruction: Use skin prep as directed Topical Primary Dressing Dakin's Solution 0.25%, 16  (oz) Discharge Instruction: Moisten gauze with Dakin's solution Secondary Dressing ABD Pad, 8x10 ELIOT, MAL[place] ([id number]) [id number]  Page 9 of 9 Discharge Instruction: Apply over primary dressing as directed. Secured With 60M Medipore H Soft Cloth Surgical T ape, 4 x 10 (in/yd) Discharge Instruction: Secure with tape as directed. Compression Wrap Compression Stockings Add-Ons Electronic Signature(s) Signed: 09/26/2022 5:38:18 PM By: Shawn Stall RN, BSN Entered By: Shawn Stall on 09/26/2022 13:03:14 -------------------------------------------------------------------------------- Vitals Details Patient Name: Date of Service: [name], [name] 09/26/2022 12:30 PM Medical Record Number: [id number] Patient Account Number: [id number] Date of Birth/Sex: Treating RN: [birth year] (57 y.o. [name], Millard.Loa Primary Care Corby Villasenor: Gayleen Orem, Zena Amos Other Clinician: Referring Aiesha Leland: Treating Denzil Bristol/Extender: Geralyn Corwin BRO WN, [name] Weeks in Treatment: 9 Vital Signs Time Taken: 12:40 Temperature (F): 98.4 Pulse (bpm): 75 Respiratory Rate (breaths/min): 20 Blood Pressure (mmHg): 104/69 Reference Range: 80 - 120 mg / dl Electronic Signature(s) Signed: 09/26/2022 5:38:18 PM By: Shawn Stall RN, BSN Entered By: Shawn Stall on 09/26/2022 12:41:09

## 2022-10-02 ENCOUNTER — Emergency Department (HOSPITAL_COMMUNITY): Payer: 59

## 2022-10-02 ENCOUNTER — Inpatient Hospital Stay (HOSPITAL_COMMUNITY)
Admission: EM | Admit: 2022-10-02 | Discharge: 2022-10-07 | DRG: 698 | Disposition: A | Discharge status: Home Health | Payer: 59 | Attending: Internal Medicine | Admitting: Internal Medicine

## 2022-10-02 ENCOUNTER — Encounter (HOSPITAL_COMMUNITY): Payer: Self-pay | Admitting: Emergency Medicine

## 2022-10-02 DIAGNOSIS — R059 Cough, unspecified: Secondary | ICD-10-CM | POA: Diagnosis not present

## 2022-10-02 DIAGNOSIS — T83518A Infection and inflammatory reaction due to other urinary catheter, initial encounter: Secondary | ICD-10-CM | POA: Diagnosis not present

## 2022-10-02 DIAGNOSIS — Z7901 Long term (current) use of anticoagulants: Secondary | ICD-10-CM

## 2022-10-02 DIAGNOSIS — A419 Sepsis, unspecified organism: Secondary | ICD-10-CM | POA: Diagnosis present

## 2022-10-02 DIAGNOSIS — S31000A Unspecified open wound of lower back and pelvis without penetration into retroperitoneum, initial encounter: Secondary | ICD-10-CM

## 2022-10-02 DIAGNOSIS — R6883 Chills (without fever): Secondary | ICD-10-CM | POA: Diagnosis not present

## 2022-10-02 DIAGNOSIS — Z95828 Presence of other vascular implants and grafts: Secondary | ICD-10-CM

## 2022-10-02 DIAGNOSIS — Z792 Long term (current) use of antibiotics: Secondary | ICD-10-CM

## 2022-10-02 DIAGNOSIS — M4628 Osteomyelitis of vertebra, sacral and sacrococcygeal region: Secondary | ICD-10-CM | POA: Diagnosis present

## 2022-10-02 DIAGNOSIS — I480 Paroxysmal atrial fibrillation: Secondary | ICD-10-CM | POA: Diagnosis present

## 2022-10-02 DIAGNOSIS — I959 Hypotension, unspecified: Secondary | ICD-10-CM | POA: Diagnosis present

## 2022-10-02 DIAGNOSIS — I10 Essential (primary) hypertension: Secondary | ICD-10-CM | POA: Diagnosis present

## 2022-10-02 DIAGNOSIS — L8990 Pressure ulcer of unspecified site, unspecified stage: Secondary | ICD-10-CM | POA: Diagnosis present

## 2022-10-02 DIAGNOSIS — E119 Type 2 diabetes mellitus without complications: Secondary | ICD-10-CM

## 2022-10-02 DIAGNOSIS — E1142 Type 2 diabetes mellitus with diabetic polyneuropathy: Secondary | ICD-10-CM | POA: Diagnosis present

## 2022-10-02 DIAGNOSIS — R630 Anorexia: Secondary | ICD-10-CM

## 2022-10-02 DIAGNOSIS — Z993 Dependence on wheelchair: Secondary | ICD-10-CM

## 2022-10-02 DIAGNOSIS — Z1152 Encounter for screening for COVID-19: Secondary | ICD-10-CM

## 2022-10-02 DIAGNOSIS — Z86718 Personal history of other venous thrombosis and embolism: Secondary | ICD-10-CM

## 2022-10-02 DIAGNOSIS — R051 Acute cough: Secondary | ICD-10-CM

## 2022-10-02 DIAGNOSIS — Z833 Family history of diabetes mellitus: Secondary | ICD-10-CM

## 2022-10-02 DIAGNOSIS — R6520 Severe sepsis without septic shock: Secondary | ICD-10-CM | POA: Diagnosis present

## 2022-10-02 DIAGNOSIS — N10 Acute tubulo-interstitial nephritis: Secondary | ICD-10-CM | POA: Diagnosis present

## 2022-10-02 DIAGNOSIS — L89154 Pressure ulcer of sacral region, stage 4: Secondary | ICD-10-CM | POA: Diagnosis present

## 2022-10-02 DIAGNOSIS — Z79899 Other long term (current) drug therapy: Secondary | ICD-10-CM

## 2022-10-02 DIAGNOSIS — N390 Urinary tract infection, site not specified: Secondary | ICD-10-CM | POA: Diagnosis present

## 2022-10-02 DIAGNOSIS — E1169 Type 2 diabetes mellitus with other specified complication: Secondary | ICD-10-CM | POA: Diagnosis present

## 2022-10-02 DIAGNOSIS — Z6837 Body mass index (BMI) 37.0-37.9, adult: Secondary | ICD-10-CM

## 2022-10-02 DIAGNOSIS — Z1629 Resistance to other single specified antibiotic: Secondary | ICD-10-CM | POA: Diagnosis present

## 2022-10-02 DIAGNOSIS — N200 Calculus of kidney: Secondary | ICD-10-CM | POA: Diagnosis present

## 2022-10-02 DIAGNOSIS — Y846 Urinary catheterization as the cause of abnormal reaction of the patient, or of later complication, without mention of misadventure at the time of the procedure: Secondary | ICD-10-CM | POA: Diagnosis present

## 2022-10-02 DIAGNOSIS — R1310 Dysphagia, unspecified: Secondary | ICD-10-CM | POA: Diagnosis present

## 2022-10-02 DIAGNOSIS — Z794 Long term (current) use of insulin: Secondary | ICD-10-CM

## 2022-10-02 DIAGNOSIS — G8250 Quadriplegia, unspecified: Secondary | ICD-10-CM | POA: Diagnosis present

## 2022-10-02 DIAGNOSIS — Z7984 Long term (current) use of oral hypoglycemic drugs: Secondary | ICD-10-CM

## 2022-10-02 DIAGNOSIS — E669 Obesity, unspecified: Secondary | ICD-10-CM | POA: Diagnosis present

## 2022-10-02 DIAGNOSIS — N3000 Acute cystitis without hematuria: Secondary | ICD-10-CM | POA: Diagnosis present

## 2022-10-02 DIAGNOSIS — D638 Anemia in other chronic diseases classified elsewhere: Secondary | ICD-10-CM | POA: Diagnosis present

## 2022-10-02 DIAGNOSIS — B9620 Unspecified Escherichia coli [E. coli] as the cause of diseases classified elsewhere: Secondary | ICD-10-CM | POA: Diagnosis present

## 2022-10-02 DIAGNOSIS — B964 Proteus (mirabilis) (morganii) as the cause of diseases classified elsewhere: Secondary | ICD-10-CM | POA: Diagnosis present

## 2022-10-02 LAB — URINALYSIS, W/ REFLEX TO CULTURE (INFECTION SUSPECTED)
Bilirubin Urine: NEGATIVE
Glucose, UA: NEGATIVE mg/dL
Ketones, ur: NEGATIVE mg/dL
Nitrite: NEGATIVE
Protein, ur: 30 mg/dL — AB
RBC / HPF: >50 RBC/hpf (ref 0–5)
Specific Gravity, Urine: 1.013 (ref 1.005–1.030)
pH: 8 (ref 5.0–8.0)

## 2022-10-02 LAB — CBC WITH DIFFERENTIAL/PLATELET
Abs Immature Granulocytes: 0.04 10*3/uL (ref 0.00–0.07)
Basophils Absolute: 0.1 10*3/uL (ref 0.0–0.1)
Basophils Relative: 1 %
Eosinophils Absolute: 0.3 10*3/uL (ref 0.0–0.5)
Eosinophils Relative: 3 %
HCT: 37.2 % — ABNORMAL LOW (ref 39.0–52.0)
Hemoglobin: 11.4 g/dL — ABNORMAL LOW (ref 13.0–17.0)
Immature Granulocytes: 0 %
Lymphocytes Relative: 21 %
Lymphs Abs: 2.1 10*3/uL (ref 0.7–4.0)
MCH: 25.6 pg — ABNORMAL LOW (ref 26.0–34.0)
MCHC: 30.6 g/dL (ref 30.0–36.0)
MCV: 83.4 fL (ref 80.0–100.0)
Monocytes Absolute: 0.6 10*3/uL (ref 0.1–1.0)
Monocytes Relative: 6 %
Neutro Abs: 6.9 10*3/uL (ref 1.7–7.7)
Neutrophils Relative %: 69 %
Platelets: 434 10*3/uL — ABNORMAL HIGH (ref 150–400)
RBC: 4.46 MIL/uL (ref 4.22–5.81)
RDW: 14.4 % (ref 11.5–15.5)
WBC: 10.1 10*3/uL (ref 4.0–10.5)
nRBC: 0 % (ref 0.0–0.2)

## 2022-10-02 LAB — COMPREHENSIVE METABOLIC PANEL
ALT: 19 U/L (ref 0–44)
AST: 21 U/L (ref 15–41)
Albumin: 2.9 g/dL — ABNORMAL LOW (ref 3.5–5.0)
Alkaline Phosphatase: 61 U/L (ref 38–126)
Anion gap: 12 (ref 5–15)
BUN: 8 mg/dL (ref 6–20)
CO2: 21 mmol/L — ABNORMAL LOW (ref 22–32)
Calcium: 8.9 mg/dL (ref 8.9–10.3)
Chloride: 101 mmol/L (ref 98–111)
Creatinine, Ser: 0.75 mg/dL (ref 0.61–1.24)
GFR, Estimated: >60 mL/min (ref >60)
Glucose, Bld: 144 mg/dL — ABNORMAL HIGH (ref 70–99)
Potassium: 3.8 mmol/L (ref 3.5–5.1)
Sodium: 134 mmol/L — ABNORMAL LOW (ref 135–145)
Total Bilirubin: 0.5 mg/dL (ref 0.3–1.2)
Total Protein: 8.6 g/dL — ABNORMAL HIGH (ref 6.5–8.1)

## 2022-10-02 LAB — PROTIME-INR
INR: 1.3 — ABNORMAL HIGH (ref 0.8–1.2)
Prothrombin Time: 16.8 seconds — ABNORMAL HIGH (ref 11.4–15.2)

## 2022-10-02 LAB — SARS CORONAVIRUS 2 BY RT PCR: SARS Coronavirus 2 by RT PCR: NEGATIVE

## 2022-10-02 LAB — I-STAT CG4 LACTIC ACID, ED
Lactic Acid, Venous: 1.9 mmol/L (ref 0.5–1.9)
Lactic Acid, Venous: 1.5 mmol/L (ref 0.5–1.9)

## 2022-10-02 MED ORDER — VANCOMYCIN HCL 10 G IV SOLR
2500.0000 mg | Freq: Once | INTRAVENOUS | Status: AC
  Administered 2022-10-02: 2500 mg via INTRAVENOUS
  Filled 2022-10-02: qty 2500

## 2022-10-02 MED ORDER — METRONIDAZOLE 500 MG/100ML IV SOLN
500.0000 mg | Freq: Once | INTRAVENOUS | Status: AC
  Administered 2022-10-02: 500 mg via INTRAVENOUS
  Filled 2022-10-02: qty 100

## 2022-10-02 MED ORDER — LACTATED RINGERS IV BOLUS
1000.0000 mL | Freq: Once | INTRAVENOUS | Status: AC
  Administered 2022-10-02: 1000 mL via INTRAVENOUS

## 2022-10-02 MED ORDER — IOHEXOL 350 MG/ML SOLN
135.0000 mL | Freq: Once | INTRAVENOUS | Status: AC | PRN
  Administered 2022-10-02: 135 mL via INTRAVENOUS

## 2022-10-02 MED ORDER — SODIUM CHLORIDE 0.9 % IV SOLN
2.0000 g | Freq: Once | INTRAVENOUS | Status: AC
  Administered 2022-10-02: 2 g via INTRAVENOUS
  Filled 2022-10-02: qty 12.5

## 2022-10-02 NOTE — ED Triage Notes (Signed)
Using interpreter, pt states he was sent from PCP for possible sepsis. Pt went to PCP due to cough when drinking. Also reports chronic wound. Pt wheelchair bound and has chronic foley.

## 2022-10-02 NOTE — ED Provider Notes (Signed)
[place] EMERGENCY DEPARTMENT AT [facility] Provider Note   CSN: [id number] Arrival date & time: 10/02/22  1747     History {Add pertinent medical, surgical, social history, OB history to HPI:1} Chief Complaint  Patient presents with   Aspiration   Wound Check    [name] is a 57 y.o. male.  HPI      Chills, nausea, coughing up when he drinks About 1 week Cough is new with phlegm, saw PCP about it who recommended drinking from a straw.   Doesn't feel chest pain, Does seem dyspnea, when turns the left, try different positions seems pretty tired  Doesn't think it is the flu Having chills , cough like cold  Taking temp at home 99 Urinating less, is more orange in color, not clear urine Low appetite, nausea, headache and neck pain  No diarrhea, doesn't feel that part of abdomen  Past Medical History:  Diagnosis Date   Diabetes mellitus without complication (HCC)    Dyspnea    History of cervical fracture    Hypertension    Paralysis (HCC)    BLE     Home Medications Prior to Admission medications   Medication Sig Start Date End Date Taking? Authorizing Provider  acetaminophen (TYLENOL) 500 MG tablet Take 500 mg by mouth every 6 (six) hours as needed for mild pain.    [provider]  amoxicillin-clavulanate (AUGMENTIN) 875-125 MG tablet Take 1 tablet by mouth 2 (two) times daily. 08/05/22   Veryl Speak, FNP  apixaban (ELIQUIS) 5 MG TABS tablet Take 1 tablet by mouth 2 (two) times daily. 01/22/22   [provider]  baclofen (LIORESAL) 10 MG tablet TAKE 1 TABLET BY MOUTH 3 TIMES DAILY AS NEEDED FOR MUSCLE SPASMS OR tightness Patient taking differently: Take 10 mg by mouth 3 (three) times daily as needed for muscle spasms (or tightness). 12/11/21   Lovorn, Aundra Millet, MD  BISACODYL LAXATIVE RE Place 1 suppository rectally at bedtime.    [provider]  blood glucose meter kit and supplies Dispense based on patient and  insurance preference. Use up to four times daily as directed. (FOR ICD-10 E10.9, E11.9). 05/27/19   Grayce Sessions, NP  Blood Glucose Monitoring Suppl (PRECISION XTRA) DEVI by Does not apply route. 02/28/22   [provider]  Carboxymethylcellulose Sodium (ARTIFICIAL TEARS OP) Apply 1-2 drops to eye daily as needed (dry eyes).     [provider]  Continuous Glucose Sensor (DEXCOM G7 SENSOR) MISC 1 Each by miscellaneous route every 10 (ten) days 07/18/22   [provider]  docusate sodium (COLACE) 100 MG capsule Take 1 capsule (100 mg total) by mouth 2 (two) times daily for 5 days, then as directed by physician 07/12/22   Merlene Laughter, DO  doxycycline (VIBRA-TABS) 100 MG tablet Take 1 tablet (100 mg total) by mouth 2 (two) times daily. 08/05/22   Veryl Speak, FNP  FLUoxetine (PROZAC) 20 MG capsule Take 1 capsule (20 mg total) by mouth daily. Patient taking differently: Take 20 mg by mouth at bedtime. 05/01/22   Lovorn, Aundra Millet, MD  furosemide (LASIX) 40 MG tablet Take 1 tablet daily for 6 days, take as needed after that for swelling or weight gain 09/25/19   Rolly Salter, MD  gabapentin (NEURONTIN) 600 MG tablet TAKE 1 TABLET BY MOUTH 3 TIMES DAILY Patient taking differently: Take 600 mg by mouth 3 (three) times daily. 03/14/22   Lovorn, Aundra Millet, MD  glipiZIDE (GLUCOTROL)  10 MG tablet Take 1 tablet by mouth 2 (two) times daily. 11/22/21   [provider]  glucose blood (FREESTYLE TEST STRIPS) test strip 1 Each daily. 02/28/22   [provider]  HYDROcodone-acetaminophen (NORCO) 5-325 MG tablet Take 1 tablet by mouth 2 (two) times daily as needed for moderate pain. 03/01/22   Lovorn, Aundra Millet, MD  insulin glargine (LANTUS) 100 UNIT/ML injection Inject 30 Units into the skin at bedtime. 02/28/22   [provider]  Insulin Syringe-Needle U-100 (MAGELLAN INSULIN SAFETY SYR) 30G X 5/16" 1 ML MISC 1 Each by miscellaneous route nightly at bedtime Use  with injection of insulin one time at night 02/28/22   [provider]  Lancets MISC daily. 02/28/22   [provider]  lisinopril (ZESTRIL) 10 MG tablet Take 10 mg by mouth daily. 02/28/22   [provider]  ondansetron (ZOFRAN) 4 MG tablet Take 1 tablet (4 mg total) by mouth every 6 (six) hours as needed for nausea. 07/12/22   Marguerita Merles Latif, DO  senna (SENOKOT) 8.6 MG TABS tablet Take 1 tablet by mouth daily.    [provider]  sildenafil (VIAGRA) 100 MG tablet Take 1 tablet (100 mg total) by mouth daily as needed for erectile dysfunction. 05/01/22   Lovorn, Aundra Millet, MD  sitaGLIPtin (JANUVIA) 100 MG tablet Take 100 mg by mouth daily. 11/22/21   [provider]      Allergies    Patient has no known allergies.    Review of Systems   Review of Systems  Physical Exam Updated Vital Signs BP 120/79   Pulse 94   Temp 98.7 F (37.1 C) (Oral)   Resp 18   Ht 5\' 7"  (1.702 m)   Wt 127 kg   SpO2 98%   BMI 43.85 kg/m  Physical Exam  ED Results / Procedures / Treatments   Labs (all labs ordered are listed, but only abnormal results are displayed) Labs Reviewed  COMPREHENSIVE METABOLIC PANEL - Abnormal; Notable for the following components:      Result Value   Sodium 134 (*)    CO2 21 (*)    Glucose, Bld 144 (*)    Total Protein 8.6 (*)    Albumin 2.9 (*)    All other components within normal limits  CBC WITH DIFFERENTIAL/PLATELET - Abnormal; Notable for the following components:   Hemoglobin 11.4 (*)    HCT 37.2 (*)    MCH 25.6 (*)    Platelets 434 (*)    All other components within normal limits  PROTIME-INR - Abnormal; Notable for the following components:   Prothrombin Time 16.8 (*)    INR 1.3 (*)    All other components within normal limits  CULTURE, BLOOD (ROUTINE X 2)  CULTURE, BLOOD (ROUTINE X 2)  URINALYSIS, W/ REFLEX TO CULTURE (INFECTION SUSPECTED)  I-STAT CG4 LACTIC ACID, ED  I-STAT CG4 LACTIC ACID, ED    EKG EKG  Interpretation Date/Time:  Wednesday October 02 2022 18:13:49 EDT Ventricular Rate:  87 PR Interval:  170 QRS Duration:  88 QT Interval:  362 QTC Calculation: 435 R Axis:   50  Text Interpretation: Normal sinus rhythm Normal ECG When compared with ECG of 05-Jul-2022 21:42, No significant change since last tracing Confirmed by Alvira Monday ([id number]) on 10/02/2022 6:37:15 PM  Radiology DG Chest 2 View  Result Date: 10/02/2022 CLINICAL DATA:  Suspected Sepsis.  Weakness. EXAM: CHEST - 2 VIEW COMPARISON:  07/05/2022. FINDINGS: Bilateral lung fields are clear. Bilateral  costophrenic angles are clear. Normal cardio-mediastinal silhouette. No acute osseous abnormalities. Partially seen cervicothoracic spinal fixation hardware. The soft tissues are within normal limits. IMPRESSION: No active cardiopulmonary disease. Electronically Signed   By: Jules Schick M.D.   On: 10/02/2022 19:43    Procedures Procedures  {Document cardiac monitor, telemetry assessment procedure when appropriate:1}  Medications Ordered in ED Medications  metroNIDAZOLE (FLAGYL) IVPB 500 mg (500 mg Intravenous New Bag/Given 10/02/22 2122)  vancomycin (VANCOCIN) 2,500 mg in sodium chloride 0.9 % 500 mL IVPB (has no administration in time range)  lactated ringers bolus 1,000 mL (0 mLs Intravenous Stopped 10/02/22 2122)  ceFEPIme (MAXIPIME) 2 g in sodium chloride 0.9 % 100 mL IVPB (0 g Intravenous Stopped 10/02/22 2122)    ED Course/ Medical Decision Making/ A&P   {   Click here for ABCD2, HEART and other calculatorsREFRESH Note before signing :1}                              Medical Decision Making Amount and/or Complexity of Data Reviewed Labs: ordered. Radiology: ordered.  Risk Prescription drug management.   ***  {Document critical care time when appropriate:1} {Document review of labs and clinical decision tools ie heart score, Chads2Vasc2 etc:1}  {Document your independent review of radiology images, and any  outside records:1} {Document your discussion with family members, caretakers, and with consultants:1} {Document social determinants of health affecting pt's care:1} {Document your decision making why or why not admission, treatments were needed:1} Final Clinical Impression(s) / ED Diagnoses Final diagnoses:  None    Rx / DC Orders ED Discharge Orders     None

## 2022-10-02 NOTE — ED Provider Triage Note (Signed)
Emergency Medicine Provider Triage Evaluation Note  [name] , [name] 57 y.o. male  was evaluated in triage.  Pt complains of concerns for aspiration and wound check. [name] notes that he has [name] wound to his bottom.  He was sent here by his primary care provider due to concerns for possible aspiration pneumonia as well as hypotension and check for his wound. Denies chest pain, shortness of breath, abdominal pain, nausea, vomiting.  Patient notes that he lays in the bed approximately 16 hours [name] day.   Review of Systems  Positive:  Negative:   Physical Exam  BP (!) 88/65   Pulse 92   Temp 98 F (36.7 C)   Resp 16   Ht 5\' 7"  (1.702 m)   Wt 127 kg   SpO2 98%   BMI 43.85 kg/m  Gen:   Awake, no distress   Resp:  Normal effort  MSK:   Moves extremities without difficulty  Other:  Patient wheelchair-bound, evaluation of wound deferred in triage.  Medical Decision Making  Medically screening exam initiated at 6:13 PM.  Appropriate orders placed.  [name] was informed that the remainder of the evaluation will be completed by another provider, this initial triage assessment does not replace that evaluation, and the importance of remaining in the ED until their evaluation is complete.  6:13 PM - Discussed with RN that patient is in need of [name] room immediately. RN aware and working on room placement.    Harry Toback A, PA-C 10/02/22 1814

## 2022-10-02 NOTE — Progress Notes (Signed)
ED Pharmacy Antibiotic Sign Off An antibiotic consult was received from an ED provider for vancomycin and cefepime per pharmacy dosing for sepsis. A chart review was completed to assess appropriateness.   The following one time order(s) were placed:  Vancomycin 2500 mg IV x 1  Cefepime 2 g IV x 1   Further antibiotic and/or antibiotic pharmacy consults should be ordered by the admitting provider if indicated.   Thank you for allowing pharmacy to be a part of this patient's care.   Griffin Dakin, [facility]  Clinical Pharmacist 10/02/22 6:42 PM

## 2022-10-03 ENCOUNTER — Other Ambulatory Visit: Payer: Self-pay

## 2022-10-03 ENCOUNTER — Encounter (HOSPITAL_COMMUNITY): Payer: Self-pay | Admitting: Internal Medicine

## 2022-10-03 DIAGNOSIS — N390 Urinary tract infection, site not specified: Secondary | ICD-10-CM | POA: Insufficient documentation

## 2022-10-03 DIAGNOSIS — Y846 Urinary catheterization as the cause of abnormal reaction of the patient, or of later complication, without mention of misadventure at the time of the procedure: Secondary | ICD-10-CM | POA: Diagnosis present

## 2022-10-03 DIAGNOSIS — M4628 Osteomyelitis of vertebra, sacral and sacrococcygeal region: Secondary | ICD-10-CM | POA: Diagnosis present

## 2022-10-03 DIAGNOSIS — I480 Paroxysmal atrial fibrillation: Secondary | ICD-10-CM | POA: Diagnosis present

## 2022-10-03 DIAGNOSIS — N3000 Acute cystitis without hematuria: Secondary | ICD-10-CM | POA: Diagnosis not present

## 2022-10-03 DIAGNOSIS — G8250 Quadriplegia, unspecified: Secondary | ICD-10-CM | POA: Diagnosis present

## 2022-10-03 DIAGNOSIS — Z993 Dependence on wheelchair: Secondary | ICD-10-CM | POA: Diagnosis not present

## 2022-10-03 DIAGNOSIS — Z1152 Encounter for screening for COVID-19: Secondary | ICD-10-CM | POA: Diagnosis not present

## 2022-10-03 DIAGNOSIS — R059 Cough, unspecified: Secondary | ICD-10-CM | POA: Diagnosis not present

## 2022-10-03 DIAGNOSIS — N10 Acute tubulo-interstitial nephritis: Secondary | ICD-10-CM | POA: Diagnosis present

## 2022-10-03 DIAGNOSIS — I10 Essential (primary) hypertension: Secondary | ICD-10-CM | POA: Diagnosis present

## 2022-10-03 DIAGNOSIS — B964 Proteus (mirabilis) (morganii) as the cause of diseases classified elsewhere: Secondary | ICD-10-CM | POA: Diagnosis present

## 2022-10-03 DIAGNOSIS — R6883 Chills (without fever): Secondary | ICD-10-CM | POA: Diagnosis present

## 2022-10-03 DIAGNOSIS — Z794 Long term (current) use of insulin: Secondary | ICD-10-CM | POA: Diagnosis not present

## 2022-10-03 DIAGNOSIS — R051 Acute cough: Secondary | ICD-10-CM

## 2022-10-03 DIAGNOSIS — D638 Anemia in other chronic diseases classified elsewhere: Secondary | ICD-10-CM | POA: Diagnosis present

## 2022-10-03 DIAGNOSIS — B9620 Unspecified Escherichia coli [E. coli] as the cause of diseases classified elsewhere: Secondary | ICD-10-CM | POA: Diagnosis present

## 2022-10-03 DIAGNOSIS — E669 Obesity, unspecified: Secondary | ICD-10-CM | POA: Diagnosis present

## 2022-10-03 DIAGNOSIS — E1142 Type 2 diabetes mellitus with diabetic polyneuropathy: Secondary | ICD-10-CM | POA: Diagnosis present

## 2022-10-03 DIAGNOSIS — Z95828 Presence of other vascular implants and grafts: Secondary | ICD-10-CM | POA: Diagnosis not present

## 2022-10-03 DIAGNOSIS — A419 Sepsis, unspecified organism: Secondary | ICD-10-CM | POA: Diagnosis present

## 2022-10-03 DIAGNOSIS — T83518A Infection and inflammatory reaction due to other urinary catheter, initial encounter: Secondary | ICD-10-CM | POA: Diagnosis present

## 2022-10-03 DIAGNOSIS — Z1629 Resistance to other single specified antibiotic: Secondary | ICD-10-CM | POA: Diagnosis present

## 2022-10-03 DIAGNOSIS — I959 Hypotension, unspecified: Secondary | ICD-10-CM | POA: Diagnosis present

## 2022-10-03 DIAGNOSIS — R6520 Severe sepsis without septic shock: Secondary | ICD-10-CM | POA: Diagnosis present

## 2022-10-03 DIAGNOSIS — L89154 Pressure ulcer of sacral region, stage 4: Secondary | ICD-10-CM | POA: Diagnosis present

## 2022-10-03 DIAGNOSIS — E1169 Type 2 diabetes mellitus with other specified complication: Secondary | ICD-10-CM | POA: Diagnosis present

## 2022-10-03 DIAGNOSIS — R1310 Dysphagia, unspecified: Secondary | ICD-10-CM | POA: Diagnosis present

## 2022-10-03 DIAGNOSIS — N200 Calculus of kidney: Secondary | ICD-10-CM | POA: Diagnosis present

## 2022-10-03 LAB — COMPREHENSIVE METABOLIC PANEL
ALT: 17 U/L (ref 0–44)
AST: 17 U/L (ref 15–41)
Albumin: 2.5 g/dL — ABNORMAL LOW (ref 3.5–5.0)
Alkaline Phosphatase: 56 U/L (ref 38–126)
Anion gap: 9 (ref 5–15)
BUN: 7 mg/dL (ref 6–20)
CO2: 23 mmol/L (ref 22–32)
Calcium: 8.6 mg/dL — ABNORMAL LOW (ref 8.9–10.3)
Chloride: 101 mmol/L (ref 98–111)
Creatinine, Ser: 0.72 mg/dL (ref 0.61–1.24)
GFR, Estimated: >60 mL/min (ref >60)
Glucose, Bld: 161 mg/dL — ABNORMAL HIGH (ref 70–99)
Potassium: 3.8 mmol/L (ref 3.5–5.1)
Sodium: 133 mmol/L — ABNORMAL LOW (ref 135–145)
Total Bilirubin: 0.5 mg/dL (ref 0.3–1.2)
Total Protein: 7.7 g/dL (ref 6.5–8.1)

## 2022-10-03 LAB — CBC WITH DIFFERENTIAL/PLATELET
Abs Immature Granulocytes: 0.05 10*3/uL (ref 0.00–0.07)
Basophils Absolute: 0.1 10*3/uL (ref 0.0–0.1)
Basophils Relative: 1 %
Eosinophils Absolute: 0.2 10*3/uL (ref 0.0–0.5)
Eosinophils Relative: 2 %
HCT: 33.7 % — ABNORMAL LOW (ref 39.0–52.0)
Hemoglobin: 10.6 g/dL — ABNORMAL LOW (ref 13.0–17.0)
Immature Granulocytes: 1 %
Lymphocytes Relative: 19 %
Lymphs Abs: 1.7 10*3/uL (ref 0.7–4.0)
MCH: 26.6 pg (ref 26.0–34.0)
MCHC: 31.5 g/dL (ref 30.0–36.0)
MCV: 84.7 fL (ref 80.0–100.0)
Monocytes Absolute: 0.5 10*3/uL (ref 0.1–1.0)
Monocytes Relative: 6 %
Neutro Abs: 6.5 10*3/uL (ref 1.7–7.7)
Neutrophils Relative %: 71 %
Platelets: 379 10*3/uL (ref 150–400)
RBC: 3.98 MIL/uL — ABNORMAL LOW (ref 4.22–5.81)
RDW: 14.2 % (ref 11.5–15.5)
WBC: 9 10*3/uL (ref 4.0–10.5)
nRBC: 0 % (ref 0.0–0.2)

## 2022-10-03 LAB — URINE CULTURE

## 2022-10-03 LAB — GLUCOSE, CAPILLARY
Glucose-Capillary: 134 mg/dL — ABNORMAL HIGH (ref 70–99)
Glucose-Capillary: 145 mg/dL — ABNORMAL HIGH (ref 70–99)
Glucose-Capillary: 139 mg/dL — ABNORMAL HIGH (ref 70–99)
Glucose-Capillary: 147 mg/dL — ABNORMAL HIGH (ref 70–99)
Glucose-Capillary: 161 mg/dL — ABNORMAL HIGH (ref 70–99)

## 2022-10-03 LAB — SEDIMENTATION RATE: Sed Rate: 131 mm/hr — ABNORMAL HIGH (ref 0–16)

## 2022-10-03 LAB — C-REACTIVE PROTEIN: CRP: 11.7 mg/dL — ABNORMAL HIGH (ref <1.0)

## 2022-10-03 LAB — MAGNESIUM: Magnesium: 1.9 mg/dL (ref 1.7–2.4)

## 2022-10-03 MED ORDER — ORAL CARE MOUTH RINSE: 15.0000 mL | OROMUCOSAL | Status: DC | PRN

## 2022-10-03 MED ORDER — SODIUM CHLORIDE 0.9 % IV SOLN
1.0000 g | Freq: Every day | INTRAVENOUS | Status: DC
  Administered 2022-10-03 – 2022-10-07 (×5): 1 g via INTRAVENOUS
  Filled 2022-10-03 (×5): qty 10

## 2022-10-03 MED ORDER — APIXABAN 5 MG PO TABS
5.0000 mg | ORAL_TABLET | Freq: Two times a day (BID) | ORAL | Status: DC
  Administered 2022-10-03 – 2022-10-07 (×8): 5 mg via ORAL
  Filled 2022-10-03 (×8): qty 1

## 2022-10-03 MED ORDER — ACETAMINOPHEN 650 MG RE SUPP: 650.0000 mg | Freq: Four times a day (QID) | RECTAL | Status: DC | PRN

## 2022-10-03 MED ORDER — ACETAMINOPHEN 325 MG PO TABS
650.0000 mg | ORAL_TABLET | Freq: Four times a day (QID) | ORAL | Status: DC | PRN
  Administered 2022-10-03: 650 mg via ORAL
  Filled 2022-10-03: qty 2

## 2022-10-03 MED ORDER — CHLORHEXIDINE GLUCONATE CLOTH 2 % EX PADS
6.0000 | MEDICATED_PAD | Freq: Every day | CUTANEOUS | Status: DC
  Administered 2022-10-04 – 2022-10-07 (×4): 6 via TOPICAL

## 2022-10-03 MED ORDER — FENTANYL CITRATE PF 50 MCG/ML IJ SOSY
50.0000 ug | PREFILLED_SYRINGE | Freq: Once | INTRAMUSCULAR | Status: AC
  Administered 2022-10-03: 50 ug via INTRAVENOUS
  Filled 2022-10-03: qty 1

## 2022-10-03 MED ORDER — ONDANSETRON HCL 4 MG/2ML IJ SOLN: 4.0000 mg | Freq: Four times a day (QID) | INTRAMUSCULAR | Status: DC | PRN

## 2022-10-03 MED ORDER — FLUOXETINE HCL 20 MG PO CAPS
20.0000 mg | ORAL_CAPSULE | Freq: Every day | ORAL | Status: DC
  Administered 2022-10-03 – 2022-10-06 (×4): 20 mg via ORAL
  Filled 2022-10-03 (×4): qty 1

## 2022-10-03 MED ORDER — GABAPENTIN 300 MG PO CAPS
600.0000 mg | ORAL_CAPSULE | Freq: Three times a day (TID) | ORAL | Status: DC
  Administered 2022-10-03 – 2022-10-07 (×12): 600 mg via ORAL
  Filled 2022-10-03 (×12): qty 2

## 2022-10-03 MED ORDER — LACTATED RINGERS IV SOLN: INTRAVENOUS | Status: AC

## 2022-10-03 MED ORDER — PANTOPRAZOLE SODIUM 40 MG IV SOLR
40.0000 mg | INTRAVENOUS | Status: DC
  Administered 2022-10-03: 40 mg via INTRAVENOUS
  Filled 2022-10-03: qty 10

## 2022-10-03 MED ORDER — BACLOFEN 10 MG PO TABS
10.0000 mg | ORAL_TABLET | Freq: Three times a day (TID) | ORAL | Status: DC | PRN
  Administered 2022-10-03: 10 mg via ORAL
  Filled 2022-10-03: qty 1

## 2022-10-03 MED ORDER — INSULIN GLARGINE-YFGN 100 UNIT/ML ~~LOC~~ SOLN
12.0000 [IU] | Freq: Every day | SUBCUTANEOUS | Status: DC
  Administered 2022-10-03 – 2022-10-06 (×5): 12 [IU] via SUBCUTANEOUS
  Filled 2022-10-03 (×8): qty 0.12

## 2022-10-03 MED ORDER — INSULIN ASPART 100 UNIT/ML IJ SOLN
0.0000 [IU] | Freq: Three times a day (TID) | INTRAMUSCULAR | Status: DC
  Administered 2022-10-03 – 2022-10-04 (×5): 1 [IU] via SUBCUTANEOUS
  Administered 2022-10-04: 3 [IU] via SUBCUTANEOUS
  Administered 2022-10-05: 1 [IU] via SUBCUTANEOUS
  Administered 2022-10-05: 2 [IU] via SUBCUTANEOUS
  Administered 2022-10-06 – 2022-10-07 (×4): 1 [IU] via SUBCUTANEOUS

## 2022-10-03 NOTE — ED Notes (Signed)
ED TO INPATIENT HANDOFF REPORT  ED Nurse Name and Phone #: [phone]  S Name/Age/Gender [name] 57 y.o. male Room/Bed: 035C/035C  Code Status   Code Status: Full Code  Home/SNF/Other Home Patient oriented to: self, place, time, and situation Is this baseline? Yes   Triage Complete: Triage complete  Chief Complaint UTI (urinary tract infection) [N39.0]  Triage Note Using interpreter, pt states he was sent from PCP for possible sepsis. Pt went to PCP due to cough when drinking. Also reports chronic wound. Pt wheelchair bound and has chronic foley.    Allergies No Known Allergies  Level of Care/Admitting Diagnosis ED Disposition     ED Disposition  Admit   Condition  --   Comment  Hospital Area: [facility] [[id number]]  Level of Care: Progressive [102]  Admit to Progressive based on following criteria: MULTISYSTEM THREATS such as stable sepsis, metabolic/electrolyte imbalance with or without encephalopathy that is responding to early treatment.  May admit patient to [facility] or [facility] if equivalent level of care is available:: No  Covid Evaluation: Confirmed COVID Negative  Diagnosis: UTI (urinary tract infection) [629528]  Admitting Physician: Angie Fava [[id number]]  Attending Physician: Angie Fava [[id number]]  Certification:: I certify this patient will need inpatient services for at least 2 midnights  Expected Medical Readiness: 10/05/2022          B Medical/Surgery History Past Medical History:  Diagnosis Date   Diabetes mellitus without complication (HCC)    Dyspnea    History of cervical fracture    Hypertension    Paralysis (HCC)    BLE   Past Surgical History:  Procedure Laterality Date   AMPUTATION Left 08/10/2021   Procedure: LEFT 5TH RAY AMPUTATION;  Surgeon: [name], MD;  Location: [facility] OR;  Service: Orthopedics;  Laterality: Left;   CERVICAL SPINE SURGERY     INCISION AND DRAINAGE OF WOUND N/A  07/06/2022   Procedure: IRRIGATION AND DEBRIDEMENT SACRAL WOUND;  Surgeon: Violeta Gelinas, MD;  Location: [facility] OR;  Service: General;  Laterality: N/A;     A IV Location/Drains/Wounds Patient Lines/Drains/Airways Status     Active Line/Drains/Airways     Name Placement date Placement time Site Days   Peripheral IV 10/02/22 20 G Anterior;Distal;Right;Upper Antecubital 10/02/22  1945  Antecubital  1   Urethral Catheter Joshua Newtpn-RN Double-lumen 16 Fr. 01/04/22  1002  Double-lumen  272   Urethral Catheter outside facility 07/06/22  1311  --  89   Urethral Catheter Ernst Cumpston, RN Double-lumen 14 Fr. 10/02/22  2226  Double-lumen  1   Pressure Injury 04/27/20 Heel Right Stage 2 -  Partial thickness loss of dermis presenting as a shallow open injury with a red, pink wound bed without slough. 04/27/20  --  -- 889   Pressure Injury 04/27/20 Heel Right Deep Tissue Pressure Injury - Purple or maroon localized area of discolored intact skin or blood-filled blister due to damage of underlying soft tissue from pressure and/or shear. inner heel 04/27/20  --  -- 889   Wound / Incision (Open or Dehisced) 04/27/20 Foot Left 04/27/20  --  Foot  889            Intake/Output Last 24 hours  Intake/Output Summary (Last 24 hours) at 10/03/2022 0156 Last data filed at 10/02/2022 2224 Gross per 24 hour  Intake 1200.17 ml  Output --  Net 1200.17 ml    Labs/Imaging Results for orders placed or performed during the hospital  encounter of 10/02/22 (from the past 48 hour(s))  Comprehensive metabolic panel     Status: Abnormal   Collection Time: 10/02/22  6:11 PM  Result Value Ref Range   Sodium 134 (L) 135 - 145 mmol/L   Potassium 3.8 3.5 - 5.1 mmol/L   Chloride 101 98 - 111 mmol/L   CO2 21 (L) 22 - 32 mmol/L   Glucose, Bld 144 (H) 70 - 99 mg/dL    Comment: Glucose reference range applies only to samples taken after fasting for at least 8 hours.   BUN 8 6 - 20 mg/dL   Creatinine, Ser 1.61 0.61 - 1.24  mg/dL   Calcium 8.9 8.9 - 09.6 mg/dL   Total Protein 8.6 (H) 6.5 - 8.1 g/dL   Albumin 2.9 (L) 3.5 - 5.0 g/dL   AST 21 15 - 41 U/L   ALT 19 0 - 44 U/L   Alkaline Phosphatase 61 38 - 126 U/L   Total Bilirubin 0.5 0.3 - 1.2 mg/dL   GFR, Estimated >04 >54 mL/min    Comment: (NOTE) Calculated using the CKD-EPI Creatinine Equation (2021)    Anion gap 12 5 - 15    Comment: Performed at [facility], [address], [place], [place] [address]  CBC with Differential     Status: Abnormal   Collection Time: 10/02/22  6:11 PM  Result Value Ref Range   WBC 10.1 4.0 - 10.5 K/uL   RBC 4.46 4.22 - 5.81 MIL/uL   Hemoglobin 11.4 (L) 13.0 - 17.0 g/dL   HCT 91.4 (L) 78.2 - 95.6 %   MCV 83.4 80.0 - 100.0 fL   MCH 25.6 (L) 26.0 - 34.0 pg   MCHC 30.6 30.0 - 36.0 g/dL   RDW 21.3 08.6 - 57.8 %   Platelets 434 (H) 150 - 400 K/uL   nRBC 0.0 0.0 - 0.2 %   Neutrophils Relative % 69 %   Neutro Abs 6.9 1.7 - 7.7 K/uL   Lymphocytes Relative 21 %   Lymphs Abs 2.1 0.7 - 4.0 K/uL   Monocytes Relative 6 %   Monocytes Absolute 0.6 0.1 - 1.0 K/uL   Eosinophils Relative 3 %   Eosinophils Absolute 0.3 0.0 - 0.5 K/uL   Basophils Relative 1 %   Basophils Absolute 0.1 0.0 - 0.1 K/uL   Immature Granulocytes 0 %   Abs Immature Granulocytes 0.04 0.00 - 0.07 K/uL    Comment: Performed at [facility], [address], [place], [place] [address]  Protime-INR     Status: Abnormal   Collection Time: 10/02/22  6:11 PM  Result Value Ref Range   Prothrombin Time 16.8 (H) 11.4 - 15.2 seconds   INR 1.3 (H) 0.8 - 1.2    Comment: (NOTE) INR goal varies based on device and disease states. Performed at [facility], [address] [place], [place] [address]   I-Stat Lactic Acid, ED     Status: None   Collection Time: 10/02/22  7:25 PM  Result Value Ref Range   Lactic Acid, Venous 1.9 0.5 - 1.9 mmol/L  I-Stat Lactic Acid, ED     Status: None   Collection Time: 10/02/22  8:01 PM  Result Value Ref Range    Lactic Acid, Venous 1.5 0.5 - 1.9 mmol/L  Urinalysis, w/ Reflex to Culture (Infection Suspected) -Urine, Catheterized; Indwelling urinary catheter     Status: Abnormal   Collection Time: 10/02/22  8:54 PM  Result Value  Ref Range   Specimen Source URINE, CATHETERIZED    Color, Urine YELLOW YELLOW   APPearance CLOUDY (A) CLEAR   Specific Gravity, Urine 1.013 1.005 - 1.030   pH 8.0 5.0 - 8.0   Glucose, UA NEGATIVE NEGATIVE mg/dL   Hgb urine dipstick SMALL (A) NEGATIVE   Bilirubin Urine NEGATIVE NEGATIVE   Ketones, ur NEGATIVE NEGATIVE mg/dL   Protein, ur 30 (A) NEGATIVE mg/dL   Nitrite NEGATIVE NEGATIVE   Leukocytes,Ua LARGE (A) NEGATIVE   RBC / HPF >50 0 - 5 RBC/hpf   WBC, UA 11-20 0 - 5 WBC/hpf    Comment:        Reflex urine culture not performed if WBC <=10, OR if Squamous epithelial cells >5. If Squamous epithelial cells >5 suggest recollection.    Bacteria, UA FEW (A) NONE SEEN   Squamous Epithelial / HPF 0-5 0 - 5 /HPF   Mucus PRESENT    Hyaline Casts, UA PRESENT    Amorphous Crystal PRESENT    Triple Phosphate Crystal PRESENT     Comment: Performed at [facility], [address] [place], [place] [address]  SARS Coronavirus 2 by RT PCR (hospital order, performed in [facility] hospital lab) *cepheid single result test* Anterior Nasal Swab     Status: None   Collection Time: 10/02/22 10:24 PM   Specimen: Anterior Nasal Swab  Result Value Ref Range   SARS Coronavirus 2 by RT PCR NEGATIVE NEGATIVE    Comment: Performed at [facility], [address], [place], [place] [address]   CT Angio Chest PE W and/or Wo Contrast  Result Date: 10/03/2022 CLINICAL DATA:  Intra-abdominal abscess. History of paraplegia. Sacral wound. Evaluate for signs of deeper abscess/source of infectious symptoms. Pulmonary embolism suspected. EXAM: CT ANGIOGRAPHY CHEST CT ABDOMEN AND PELVIS WITH CONTRAST TECHNIQUE: Multidetector CT imaging of the chest was performed using the standard  protocol during bolus administration of intravenous contrast. Multiplanar CT image reconstructions and MIPs were obtained to evaluate the vascular anatomy. Multidetector CT imaging of the abdomen and pelvis was performed using the standard protocol during bolus administration of intravenous contrast. RADIATION DOSE REDUCTION: This exam was performed according to the departmental dose-optimization program which includes automated exposure control, adjustment of the mA and/or kV according to patient size and/or use of iterative reconstruction technique. CONTRAST:  OMNIPAQUE IOHEXOL 350 MG/ML SOLN COMPARISON:  CT abdomen and pelvis 07/05/2022 and same day chest radiograph FINDINGS: CTA CHEST FINDINGS Cardiovascular: Negative for acute pulmonary embolism. No pericardial effusion. No aortic aneurysm or dissection. Mild aortic atherosclerotic calcification. Mediastinum/Nodes: Trachea and esophagus are unremarkable. No mediastinal or hilar adenopathy. Lungs/Pleura: No focal consolidation, pleural effusion, or pneumothorax. Musculoskeletal: No acute fracture. Partially visualized cervicothoracic spine fusion. Review of the MIP images confirms the above findings. CT ABDOMEN and PELVIS FINDINGS Hepatobiliary: Unremarkable. Pancreas: Unremarkable. Spleen: Unremarkable. Adrenals/Urinary Tract: Stable adrenal glands. Redemonstrated left nephrolithiasis with stone in the left renal pelvis measuring 2.1 cm. Question geographic hypoenhancement in the superior pole of the left kidney versus artifact. No hydronephrosis or obstructing calculi. Periureteral stranding about the ureters as they cross the iliac vessels. Foley catheter in the nondistended thick-walled bladder. Stomach/Bowel: Normal caliber large and small bowel. No bowel wall thickening. Normal appendix. Stomach is within normal limits. Vascular/Lymphatic: IVC filter. Mild aortic atherosclerotic calcification. Bilateral iliac chain nodes are similar to decreased from  prior and likely reactive. Reproductive: No acute abnormality. Other: Soft tissue thickening and fluid within the presacral space.  No free intraperitoneal air. Musculoskeletal: No acute fracture. Similar appearance of the sacral decubitus ulcer abutting the coccyx. Soft tissue thickening along the gluteal cleft without abscess. No soft tissue gas. Sclerosis of the spinous processes of the sacrum and resorption of the coccyx compatible with chronic osteomyelitis. Review of the MIP images confirms the above findings. IMPRESSION: 1. Negative for acute pulmonary embolism. 2. Question geographic hypoenhancement in the superior pole of the left kidney may be due to pyelonephritis versus artifact. 3. Periureteral stranding about the ureters as they cross the iliac vessels. Thick-walled urinary bladder decompressed about a Foley catheter. Correlate with urinalysis for cystitis and ascending urinary tract infection. 4. Unchanged left nephrolithiasis measuring up to 2.1 cm. 5. Similar sacral decubitus ulcer abutting the coccyx with findings of chronic osteomyelitis. No abscess. Aortic Atherosclerosis (ICD10-I70.0). Electronically Signed   By: Minerva Fester M.D.   On: 10/03/2022 00:43   CT ABDOMEN PELVIS W CONTRAST  Result Date: 10/03/2022 CLINICAL DATA:  Intra-abdominal abscess. History of paraplegia. Sacral wound. Evaluate for signs of deeper abscess/source of infectious symptoms. Pulmonary embolism suspected. EXAM: CT ANGIOGRAPHY CHEST CT ABDOMEN AND PELVIS WITH CONTRAST TECHNIQUE: Multidetector CT imaging of the chest was performed using the standard protocol during bolus administration of intravenous contrast. Multiplanar CT image reconstructions and MIPs were obtained to evaluate the vascular anatomy. Multidetector CT imaging of the abdomen and pelvis was performed using the standard protocol during bolus administration of intravenous contrast. RADIATION DOSE REDUCTION: This exam was performed according to the  departmental dose-optimization program which includes automated exposure control, adjustment of the mA and/or kV according to patient size and/or use of iterative reconstruction technique. CONTRAST:  OMNIPAQUE IOHEXOL 350 MG/ML SOLN COMPARISON:  CT abdomen and pelvis 07/05/2022 and same day chest radiograph FINDINGS: CTA CHEST FINDINGS Cardiovascular: Negative for acute pulmonary embolism. No pericardial effusion. No aortic aneurysm or dissection. Mild aortic atherosclerotic calcification. Mediastinum/Nodes: Trachea and esophagus are unremarkable. No mediastinal or hilar adenopathy. Lungs/Pleura: No focal consolidation, pleural effusion, or pneumothorax. Musculoskeletal: No acute fracture. Partially visualized cervicothoracic spine fusion. Review of the MIP images confirms the above findings. CT ABDOMEN and PELVIS FINDINGS Hepatobiliary: Unremarkable. Pancreas: Unremarkable. Spleen: Unremarkable. Adrenals/Urinary Tract: Stable adrenal glands. Redemonstrated left nephrolithiasis with stone in the left renal pelvis measuring 2.1 cm. Question geographic hypoenhancement in the superior pole of the left kidney versus artifact. No hydronephrosis or obstructing calculi. Periureteral stranding about the ureters as they cross the iliac vessels. Foley catheter in the nondistended thick-walled bladder. Stomach/Bowel: Normal caliber large and small bowel. No bowel wall thickening. Normal appendix. Stomach is within normal limits. Vascular/Lymphatic: IVC filter. Mild aortic atherosclerotic calcification. Bilateral iliac chain nodes are similar to decreased from prior and likely reactive. Reproductive: No acute abnormality. Other: Soft tissue thickening and fluid within the presacral space. No free intraperitoneal air. Musculoskeletal: No acute fracture. Similar appearance of the sacral decubitus ulcer abutting the coccyx. Soft tissue thickening along the gluteal cleft without abscess. No soft tissue gas. Sclerosis of the  spinous processes of the sacrum and resorption of the coccyx compatible with chronic osteomyelitis. Review of the MIP images confirms the above findings. IMPRESSION: 1. Negative for acute pulmonary embolism. 2. Question geographic hypoenhancement in the superior pole of the left kidney may be due to pyelonephritis versus artifact. 3. Periureteral stranding about the ureters as they cross the iliac vessels. Thick-walled urinary bladder decompressed about a Foley catheter. Correlate with urinalysis for cystitis and ascending urinary tract infection. 4. Unchanged left nephrolithiasis measuring up  to 2.1 cm. 5. Similar sacral decubitus ulcer abutting the coccyx with findings of chronic osteomyelitis. No abscess. Aortic Atherosclerosis (ICD10-I70.0). Electronically Signed   By: Minerva Fester M.D.   On: 10/03/2022 00:43   CT Soft Tissue Neck W Contrast  Result Date: 10/03/2022 CLINICAL DATA:  A soft tissue infection suspected EXAM: CT NECK WITH CONTRAST TECHNIQUE: Multidetector CT imaging of the neck was performed using the standard protocol following the bolus administration of intravenous contrast. RADIATION DOSE REDUCTION: This exam was performed according to the departmental dose-optimization program which includes automated exposure control, adjustment of the mA and/or kV according to patient size and/or use of iterative reconstruction technique. CONTRAST:  OMNIPAQUE IOHEXOL 350 MG/ML SOLN COMPARISON:  None Available. FINDINGS: Evaluation is somewhat limited by beam hardening from the patient's spinal hardware. Pharynx and larynx: Normal. No mass or swelling. Salivary glands: No inflammation, mass, or stone. Thyroid: Normal. Lymph nodes: None enlarged or abnormal density. Vascular: Patent. Limited intracranial: Negative. Visualized orbits: Negative. Mastoids and visualized paranasal sinuses: Mild mucosal thickening in the ethmoid air cells and inferior right frontal sinus. The mastoids are well aerated.  Skeleton: No acute fracture or suspicious osseous lesion. Status post posterior fusion from C4 through the imaged thoracic spine and off the inferior field of view. Upper chest: Please see same-day CT chest. Other: None. IMPRESSION: No acute process in the neck. Electronically Signed   By: Wiliam Ke M.D.   On: 10/03/2022 00:25   CT Head Wo Contrast  Result Date: 10/03/2022 CLINICAL DATA:  Headache, new onset EXAM: CT HEAD WITHOUT CONTRAST TECHNIQUE: Contiguous axial images were obtained from the base of the skull through the vertex without intravenous contrast. RADIATION DOSE REDUCTION: This exam was performed according to the departmental dose-optimization program which includes automated exposure control, adjustment of the mA and/or kV according to patient size and/or use of iterative reconstruction technique. COMPARISON:  04/26/2020 FINDINGS: Brain: No evidence of acute infarction, hemorrhage, mass, mass effect, or midline shift. No hydrocephalus or extra-axial fluid collection. Vascular: No hyperdense vessel. Skull: Negative for fracture or focal lesion. Sinuses/Orbits: Mild mucosal thickening in the right anterior ethmoid air cells and inferior right frontal sinus. No acute finding in the orbits. Other: The mastoid air cells are well aerated. IMPRESSION: No acute intracranial process. Electronically Signed   By: Wiliam Ke M.D.   On: 10/03/2022 00:21   DG Chest 2 View  Result Date: 10/02/2022 CLINICAL DATA:  Suspected Sepsis.  Weakness. EXAM: CHEST - 2 VIEW COMPARISON:  07/05/2022. FINDINGS: Bilateral lung fields are clear. Bilateral costophrenic angles are clear. Normal cardio-mediastinal silhouette. No acute osseous abnormalities. Partially seen cervicothoracic spinal fixation hardware. The soft tissues are within normal limits. IMPRESSION: No active cardiopulmonary disease. Electronically Signed   By: Jules Schick M.D.   On: 10/02/2022 19:43    Pending Labs Unresulted Labs (From  admission, onward)     Start     Ordered   10/03/22 0500  CBC with Differential/Platelet  Tomorrow morning,   R        10/03/22 0150   10/03/22 0500  Comprehensive metabolic panel  Tomorrow morning,   R        10/03/22 0150   10/03/22 0151  Magnesium  Add-on,   AD        10/03/22 0150   10/02/22 2054  Urine Culture  Once,   R        10/02/22 2054   10/02/22 1811  Culture, blood (Routine x 2)  BLOOD CULTURE X 2,   R      10/02/22 1810            Vitals/Pain Today's Vitals   10/03/22 0100 10/03/22 0120 10/03/22 0134 10/03/22 0140  BP: 139/74 137/83  138/79  Pulse: (!) 108 (!) 109  (!) 109  Resp: 10 20  (!) 21  Temp:      TempSrc:      SpO2: 95% 93%  98%  Weight:      Height:      PainSc:   4      Isolation Precautions No active isolations  Medications Medications  acetaminophen (TYLENOL) tablet 650 mg (has no administration in time range)    Or  acetaminophen (TYLENOL) suppository 650 mg (has no administration in time range)  ondansetron (ZOFRAN) injection 4 mg (has no administration in time range)  cefTRIAXone (ROCEPHIN) 1 g in sodium chloride 0.9 % 100 mL IVPB (has no administration in time range)  lactated ringers bolus 1,000 mL (0 mLs Intravenous Stopped 10/02/22 2122)  metroNIDAZOLE (FLAGYL) IVPB 500 mg (0 mg Intravenous Stopped 10/02/22 2224)  vancomycin (VANCOCIN) 2,500 mg in sodium chloride 0.9 % 500 mL IVPB (2,500 mg Intravenous New Bag/Given 10/02/22 2230)  ceFEPIme (MAXIPIME) 2 g in sodium chloride 0.9 % 100 mL IVPB (0 g Intravenous Stopped 10/02/22 2122)  iohexol (OMNIPAQUE) 350 MG/ML injection 135 mL (135 mLs Intravenous Contrast Given 10/02/22 2318)  fentaNYL (SUBLIMAZE) injection 50 mcg (50 mcg Intravenous Given 10/03/22 0020)    Mobility non-ambulatory     Focused Assessments    R Recommendations: See Admitting Provider Note  Report given to:   Additional Notes: pt has electric wheelchair, chronic foley, a/ox4

## 2022-10-03 NOTE — Progress Notes (Signed)
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  Workup reveals UTI as likely source.  CT of abdomen pelvis showed no interval change in decubitus ulcer, no evidence of abscess.  Patient was started on ceftriaxone for treatment of UTI.  Patient states that he thinks he does feel better this morning, is no longer cold or chilly like he was.  His main concern is whether somebody was going to look at his sacral decubiti because he feels like he would like some help with that.  Denies any pain and notes that he does not have any sensation in his back.  Physical exam is notable for relatively comfortable appearing man lying in bed in NAD.  Patient was seen with Spanish interpreter.  Assessment and plan issues addressed today: Complicated UTI Continue treatment with ceftriaxone as initiated earlier today IV fluid resuscitation with LR in place  Sacral decubiti Wound nurse consult placed  Cough with drinking/eating Appreciate SLP evaluation Patient remains on regular diet with thin liquids MBS is scheduled for tomorrow   Further management of chronic issues as per H&P from earlier this morning.

## 2022-10-03 NOTE — ED Notes (Addendum)
Pt c/o being cold. Pts skin is warm, clammy and flushed. Pts temperature recorded 99.8 axillary

## 2022-10-03 NOTE — Consult Note (Signed)
WOC Nurse Consult Note: Reason for Consult: Consult requested for sacrum wound.  Pt is familiar to [facility] team from previous admission on 5/30. Wound type: Chronic Stage 4 pressure injury; 100% red and moist, small amt bloody drainage, 6X4X6cm with 1 cm undermining to wound edges.  Pressure Injury POA: Yes Dressing procedure/placement/frequency: Continue present plan of care as performed by wife at home prior to admission with moist gauze.  Topical treatment orders provided for bedside nurses to perform as follows: Apply moist gauze packing to sacrum wound Q day, using swab to fill, then cover with foam dressing.  Change foam dressing Q 3 days or PRN soiling. Please re-consult if further assistance is needed.  Thank-you,  Cammie Mcgee MSN, RN, CWOCN, [place], CNS [phone]

## 2022-10-03 NOTE — Evaluation (Signed)
Clinical/Bedside Swallow Evaluation Patient Details  Name: [name] MRN: [id number] Date of Birth: 07/03/1965  Today's Date: 10/03/2022 Time: SLP Start Time (ACUTE ONLY): 1200 SLP Stop Time (ACUTE ONLY): 1208 SLP Time Calculation (min) (ACUTE ONLY): 8 min  Past Medical History:  Past Medical History:  Diagnosis Date   Diabetes mellitus without complication (HCC)    Dyspnea    History of cervical fracture    Hypertension    Paralysis (HCC)    BLE   Past Surgical History:  Past Surgical History:  Procedure Laterality Date   AMPUTATION Left 08/10/2021   Procedure: LEFT 5TH RAY AMPUTATION;  Surgeon: [name], MD;  Location: [facility] OR;  Service: Orthopedics;  Laterality: Left;   CERVICAL SPINE SURGERY     INCISION AND DRAINAGE OF WOUND N/A 07/06/2022   Procedure: IRRIGATION AND DEBRIDEMENT SACRAL WOUND;  Surgeon: Violeta Gelinas, MD;  Location: [facility] OR;  Service: General;  Laterality: N/A;   HPI:  [name] is a 57 y.o. male with medical history significant for paraplegia, chronic indwelling Foley catheter, chronic sacral decubitus ulcer complicated by chronic osteomyelitis, type 2 diabetes mellitus complicated by diabetic peripheral polyneuropathy, essential pretension, multiple prior DVTs, chronically anticoagulated on Eliquis, anemia of chronic disease, who is admitted to [facility] on 10/02/2022 with suspected urinary tract infection after presenting from home to [facility] ED complaining of chills.      The patient reports to 3 days of progressive chills in the absence of full body rigors or generalized myalgias.  In the context of a history of paraplegia, he has a chronic indwelling Foley catheter.  Denies any recent gross hematuria.  No new onset abdominal discomfort.     Over the last week, he has noted new onset coughing when attempting to drink water.  He denies any associated shortness of breath nor any cough at times other than when attempting to consume water. CXR 2 view  negative. CT neck negative.    Assessment / Plan / Recommendation  Clinical Impression  Pt evaluated by spanish speaking SLP, pt reports he has a lot of phlegm after he drinks water and after his IV fluids started. He also reports a feeling of something stuck in his throat and a feeling that his food backing up his throat. When observed drinking, he is able to drink consecutively from a straw, several ounces, without coughing or sign of dysphagia. He is dysphonic and his cough is weak. He says his voice sounds louder than usual so unsure of baseline. pt is at risk given paraplegia, dysphonia and complaints. Will f/u with [facility] tomorrow for instrumental assessment of swallowing but recommend resuming a regular diet and thin liquids in the meantime. Sit pt upright and keep upright. Pills as tolerated SLP Visit Diagnosis: Dysphagia, oropharyngeal phase (R13.12)    Aspiration Risk  Mild aspiration risk    Diet Recommendation Regular;Thin liquid    Liquid Administration via: Cup;Straw Medication Administration: Whole meds with liquid Supervision: Staff to assist with self feeding Compensations: Slow rate;Small sips/bites Postural Changes: Seated upright at 90 degrees;Remain upright for at least 30 minutes after po intake    Other  Recommendations Oral Care Recommendations: Oral care BID    Recommendations for follow up therapy are one component of a multi-disciplinary discharge planning process, led by the attending physician.  Recommendations may be updated based on patient status, additional functional criteria and insurance authorization.  Follow up Recommendations        Assistance Recommended at Discharge  Functional Status Assessment    Frequency and Duration            Prognosis        Swallow Study   General HPI: [name] is a 57 y.o. male with medical history significant for paraplegia, chronic indwelling Foley catheter, chronic sacral decubitus ulcer complicated by  chronic osteomyelitis, type 2 diabetes mellitus complicated by diabetic peripheral polyneuropathy, essential pretension, multiple prior DVTs, chronically anticoagulated on Eliquis, anemia of chronic disease, who is admitted to [facility] on 10/02/2022 with suspected urinary tract infection after presenting from home to [facility] ED complaining of chills.      The patient reports to 3 days of progressive chills in the absence of full body rigors or generalized myalgias.  In the context of a history of paraplegia, he has a chronic indwelling Foley catheter.  Denies any recent gross hematuria.  No new onset abdominal discomfort.     Over the last week, he has noted new onset coughing when attempting to drink water.  He denies any associated shortness of breath nor any cough at times other than when attempting to consume water. CXR 2 view negative. CT neck negative. Type of Study: Bedside Swallow Evaluation Previous Swallow Assessment: none Diet Prior to this Study: NPO Temperature Spikes Noted: No Respiratory Status: Room air History of Recent Intubation: No Behavior/Cognition: Alert;Cooperative;Pleasant mood Oral Cavity Assessment: Within Functional Limits Oral Care Completed by SLP: No Oral Cavity - Dentition: Adequate natural dentition Vision: Functional for self-feeding Self-Feeding Abilities: Needs assist Patient Positioning: Upright in bed Baseline Vocal Quality: Hoarse;Breathy Volitional Cough: Weak Volitional Swallow: Able to elicit    Oral/Motor/Sensory Function Overall Oral Motor/Sensory Function: Within functional limits   Ice Chips     Thin Liquid Thin Liquid: Within functional limits Presentation: Straw    Nectar Thick Nectar Thick Liquid: Not tested   Honey Thick Honey Thick Liquid: Not tested   Puree Puree: Within functional limits   Solid     Solid: Within functional limits      [name], [name] 10/03/2022,12:33 PM

## 2022-10-03 NOTE — H&P (Signed)
History and Physical      [name] VOZ:[id number] DOB: 05/21/1965 DOA: 10/02/2022; DOS: 10/03/2022  PCP: Valerie Roys, FNP  Patient coming from: home   I have personally briefly reviewed patient's old medical records in [facility]  Chief Complaint: chills  HPI: [name] is a 57 y.o. male with medical history significant for paraplegia, chronic indwelling Foley catheter, chronic sacral decubitus ulcer complicated by chronic osteomyelitis, type 2 diabetes mellitus complicated by diabetic peripheral polyneuropathy, essential pretension, multiple prior DVTs, chronically anticoagulated on Eliquis, anemia of chronic disease, who is admitted to [facility] on 10/02/2022 with suspected urinary tract infection after presenting from home to [facility] ED complaining of chills.   The patient reports to 3 days of progressive chills in the absence of full body rigors or generalized myalgias.  In the context of a history of paraplegia, he has a chronic indwelling Foley catheter.  Denies any recent gross hematuria.  No new onset abdominal discomfort.  Over the last week, he has noted new onset coughing when attempting to drink water.  He denies any associated shortness of breath nor any cough at times other than when attempting to consume water.  No hemoptysis.  No recent headache, neck stiffness, or new rash.   He has a history of chronic sacral decubitus ulcer complicated by chronic osteomyelitis.    ED Course:  Vital signs in the ED were notable for the following: Afebrile; heart rates in the 70s to low 100s; initial blood pressure 88/65, with ensuing systolic blood pressures improving into the 130s following interval IV fluids, as further quantified below; respiratory rate 16-20, oxygen saturation 95 to 98% on room air.  Labs were notable for the following: CMP notable for the following: Creatinine 0.75, glucose 144, liver enzymes within normal limits.  Initial lactic acid  1.9, with repeat value trending down to 1.5.  CBC notable for will with cell count 2100, hemoglobin 11.4 compared to most recent prior value of 11.2 in June 2024, with presenting hemoglobin associate there is 6/recurrent properties as well as nonelevated RDW, platelet count 434.  Urinalysis associated with cloudy appearing specimen and notable for 11-20 white blood cells, large leukocyte esterase, and no evidence of squamous epithelial cells.  Blood cultures x 2 as well as urine culture were collected prior to initiation of IV antibiotics.  COVID-19 PCR negative.  Per my interpretation, EKG in ED demonstrated the following: Sinus rhythm with heart rate 87, normal intervals, no evidence of T wave or ST changes, including no evidence of ST elevation.  Imaging in the ED, per corresponding formal radiology read, was notable for the following: CTA chest, which showed no evidence of acute cardiopulmonary process, including no evidence of acute pulmonary embolism or any evidence of infiltrate, edema, effusion, or pneumothorax.  CT abdomen/pelvis with contrast, in comparison to most recent prior CT abdomen/pelvis performed on 07/05/2022, demonstrated periureteral stranding with thick-walled urinary bladder and hypoenhancement in the superior pole of the left kidney, concerning for acute cystitis with ascending urinary tract infection and left pyelonephritis.  CT abdomen/pelvis showed similar sacral decubitus ulcer with evidence of chronic osteomyelitis when compared to most recent prior CT abdomen/pelvis, without any evidence of interval changes, no evidence of abscess.  While in the ED, the following were administered: Fentanyl 50 mcg IV x 1, cefepime, IV vancomycin, Flagyl.  Lactated Ringer's x 1 L bolus.  Subsequently, the patient was admitted for further evaluation management of suspected urinary tract infection.  Review of Systems: As per HPI otherwise 10 point review of systems negative.   Past  Medical History:  Diagnosis Date   Diabetes mellitus without complication (HCC)    Dyspnea    History of cervical fracture    Hypertension    Paralysis (HCC)    BLE    Past Surgical History:  Procedure Laterality Date   AMPUTATION Left 08/10/2021   Procedure: LEFT 5TH RAY AMPUTATION;  Surgeon: [name], MD;  Location: [facility] OR;  Service: Orthopedics;  Laterality: Left;   CERVICAL SPINE SURGERY     INCISION AND DRAINAGE OF WOUND N/A 07/06/2022   Procedure: IRRIGATION AND DEBRIDEMENT SACRAL WOUND;  Surgeon: Violeta Gelinas, MD;  Location: [facility] OR;  Service: General;  Laterality: N/A;    Social History:  reports that he has never smoked. He has never used smokeless tobacco. He reports current alcohol use. He reports that he does not use drugs.   No Known Allergies  Family History  Problem Relation Age of Onset   Diabetes Mother    Diabetes Father      Prior to Admission medications   Medication Sig Start Date End Date Taking? Authorizing Provider  acetaminophen (TYLENOL) 500 MG tablet Take 500 mg by mouth every 6 (six) hours as needed for mild pain.    [provider]  amoxicillin-clavulanate (AUGMENTIN) 875-125 MG tablet Take 1 tablet by mouth 2 (two) times daily. 08/05/22   Veryl Speak, FNP  apixaban (ELIQUIS) 5 MG TABS tablet Take 1 tablet by mouth 2 (two) times daily. 01/22/22   [provider]  baclofen (LIORESAL) 10 MG tablet TAKE 1 TABLET BY MOUTH 3 TIMES DAILY AS NEEDED FOR MUSCLE SPASMS OR tightness Patient taking differently: Take 10 mg by mouth 3 (three) times daily as needed for muscle spasms (or tightness). 12/11/21   Lovorn, Aundra Millet, MD  BISACODYL LAXATIVE RE Place 1 suppository rectally at bedtime.    [provider]  blood glucose meter kit and supplies Dispense based on patient and insurance preference. Use up to four times daily as directed. (FOR ICD-10 E10.9, E11.9). 05/27/19   Grayce Sessions, NP  Blood Glucose Monitoring Suppl  (PRECISION XTRA) DEVI by Does not apply route. 02/28/22   [provider]  Carboxymethylcellulose Sodium (ARTIFICIAL TEARS OP) Apply 1-2 drops to eye daily as needed (dry eyes).     [provider]  Continuous Glucose Sensor (DEXCOM G7 SENSOR) MISC 1 Each by miscellaneous route every 10 (ten) days 07/18/22   [provider]  docusate sodium (COLACE) 100 MG capsule Take 1 capsule (100 mg total) by mouth 2 (two) times daily for 5 days, then as directed by physician 07/12/22   Merlene Laughter, DO  doxycycline (VIBRA-TABS) 100 MG tablet Take 1 tablet (100 mg total) by mouth 2 (two) times daily. 08/05/22   Veryl Speak, FNP  FLUoxetine (PROZAC) 20 MG capsule Take 1 capsule (20 mg total) by mouth daily. Patient taking differently: Take 20 mg by mouth at bedtime. 05/01/22   Lovorn, Aundra Millet, MD  furosemide (LASIX) 40 MG tablet Take 1 tablet daily for 6 days, take as needed after that for swelling or weight gain 09/25/19   Rolly Salter, MD  gabapentin (NEURONTIN) 600 MG tablet TAKE 1 TABLET BY MOUTH 3 TIMES DAILY Patient taking differently: Take 600 mg by mouth 3 (three) times daily. 03/14/22   Lovorn, Aundra Millet, MD  glipiZIDE (GLUCOTROL) 10 MG tablet Take 1 tablet by mouth 2 (two)  times daily. 11/22/21   [provider]  glucose blood (FREESTYLE TEST STRIPS) test strip 1 Each daily. 02/28/22   [provider]  HYDROcodone-acetaminophen (NORCO) 5-325 MG tablet Take 1 tablet by mouth 2 (two) times daily as needed for moderate pain. 03/01/22   Lovorn, Aundra Millet, MD  insulin glargine (LANTUS) 100 UNIT/ML injection Inject 30 Units into the skin at bedtime. 02/28/22   [provider]  Insulin Syringe-Needle U-100 (MAGELLAN INSULIN SAFETY SYR) 30G X 5/16" 1 ML MISC 1 Each by miscellaneous route nightly at bedtime Use with injection of insulin one time at night 02/28/22   [provider]  Lancets MISC daily. 02/28/22   [provider]  lisinopril (ZESTRIL)  10 MG tablet Take 10 mg by mouth daily. 02/28/22   [provider]  ondansetron (ZOFRAN) 4 MG tablet Take 1 tablet (4 mg total) by mouth every 6 (six) hours as needed for nausea. 07/12/22   Marguerita Merles Latif, DO  senna (SENOKOT) 8.6 MG TABS tablet Take 1 tablet by mouth daily.    [provider]  sildenafil (VIAGRA) 100 MG tablet Take 1 tablet (100 mg total) by mouth daily as needed for erectile dysfunction. 05/01/22   Lovorn, Aundra Millet, MD  sitaGLIPtin (JANUVIA) 100 MG tablet Take 100 mg by mouth daily. 11/22/21   [provider]     Objective    Physical Exam: Vitals:   10/03/22 0021 10/03/22 0100 10/03/22 0120 10/03/22 0140  BP:  139/74 137/83 138/79  Pulse:  (!) 108 (!) 109 (!) 109  Resp:  10 20 (!) 21  Temp: 99.8 F (37.7 C)     TempSrc: Axillary     SpO2:  95% 93% 98%  Weight:      Height:        General: appears to be stated age; alert, oriented Skin: warm, dry, no rash Head:  AT/[place] Mouth:  Oral mucosa membranes appear dry, normal dentition Neck: supple; trachea midline Heart:  RRR; did not appreciate any M/R/G Lungs: CTAB, did not appreciate any wheezes, rales, or rhonchi Abdomen: + BS; soft, ND, NT Vascular: 2+ pedal pulses b/l; 2+ radial pulses b/l Extremities: no peripheral edema, no muscle wasting     Labs on Admission: I have personally reviewed following labs and imaging studies  CBC: Recent Labs  Lab 10/02/22 1811  WBC 10.1  NEUTROABS 6.9  HGB 11.4*  HCT 37.2*  MCV 83.4  PLT 434*   Basic Metabolic Panel: Recent Labs  Lab 10/02/22 1811  NA 134*  K 3.8  CL 101  CO2 21*  GLUCOSE 144*  BUN 8  CREATININE 0.75  CALCIUM 8.9   GFR: Estimated Creatinine Clearance: 135.1 mL/min (by C-G formula based on SCr of 0.75 mg/dL). Liver Function Tests: Recent Labs  Lab 10/02/22 1811  AST 21  ALT 19  ALKPHOS 61  BILITOT 0.5  PROT 8.6*  ALBUMIN 2.9*   No results for input(s): "LIPASE", "AMYLASE" in the last 168 hours. No  results for input(s): "AMMONIA" in the last 168 hours. Coagulation Profile: Recent Labs  Lab 10/02/22 1811  INR 1.3*   Cardiac Enzymes: No results for input(s): "CKTOTAL", "CKMB", "CKMBINDEX", "TROPONINI" in the last 168 hours. BNP (last 3 results) No results for input(s): "PROBNP" in the last 8760 hours. HbA1C: No results for input(s): "HGBA1C" in the last 72 hours. CBG: No results for input(s): "GLUCAP" in the last 168 hours. Lipid Profile: No results for input(s): "CHOL", "HDL", "LDLCALC", "TRIG", "CHOLHDL", "LDLDIRECT" in  the last 72 hours. Thyroid Function Tests: No results for input(s): "TSH", "T4TOTAL", "FREET4", "T3FREE", "THYROIDAB" in the last 72 hours. Anemia Panel: No results for input(s): "VITAMINB12", "FOLATE", "FERRITIN", "TIBC", "IRON", "RETICCTPCT" in the last 72 hours. Urine analysis:    Component Value Date/Time   COLORURINE YELLOW 10/02/2022 2054   APPEARANCEUR CLOUDY (A) 10/02/2022 2054   LABSPEC 1.013 10/02/2022 2054   PHURINE 8.0 10/02/2022 2054   GLUCOSEU NEGATIVE 10/02/2022 2054   HGBUR SMALL (A) 10/02/2022 2054   BILIRUBINUR NEGATIVE 10/02/2022 2054   KETONESUR NEGATIVE 10/02/2022 2054   PROTEINUR 30 (A) 10/02/2022 2054   NITRITE NEGATIVE 10/02/2022 2054   LEUKOCYTESUR LARGE (A) 10/02/2022 2054    Radiological Exams on Admission: CT Angio Chest PE W and/or Wo Contrast  Result Date: 10/03/2022 CLINICAL DATA:  Intra-abdominal abscess. History of paraplegia. Sacral wound. Evaluate for signs of deeper abscess/source of infectious symptoms. Pulmonary embolism suspected. EXAM: CT ANGIOGRAPHY CHEST CT ABDOMEN AND PELVIS WITH CONTRAST TECHNIQUE: Multidetector CT imaging of the chest was performed using the standard protocol during bolus administration of intravenous contrast. Multiplanar CT image reconstructions and MIPs were obtained to evaluate the vascular anatomy. Multidetector CT imaging of the abdomen and pelvis was performed using the standard protocol  during bolus administration of intravenous contrast. RADIATION DOSE REDUCTION: This exam was performed according to the departmental dose-optimization program which includes automated exposure control, adjustment of the mA and/or kV according to patient size and/or use of iterative reconstruction technique. CONTRAST:  OMNIPAQUE IOHEXOL 350 MG/ML SOLN COMPARISON:  CT abdomen and pelvis 07/05/2022 and same day chest radiograph FINDINGS: CTA CHEST FINDINGS Cardiovascular: Negative for acute pulmonary embolism. No pericardial effusion. No aortic aneurysm or dissection. Mild aortic atherosclerotic calcification. Mediastinum/Nodes: Trachea and esophagus are unremarkable. No mediastinal or hilar adenopathy. Lungs/Pleura: No focal consolidation, pleural effusion, or pneumothorax. Musculoskeletal: No acute fracture. Partially visualized cervicothoracic spine fusion. Review of the MIP images confirms the above findings. CT ABDOMEN and PELVIS FINDINGS Hepatobiliary: Unremarkable. Pancreas: Unremarkable. Spleen: Unremarkable. Adrenals/Urinary Tract: Stable adrenal glands. Redemonstrated left nephrolithiasis with stone in the left renal pelvis measuring 2.1 cm. Question geographic hypoenhancement in the superior pole of the left kidney versus artifact. No hydronephrosis or obstructing calculi. Periureteral stranding about the ureters as they cross the iliac vessels. Foley catheter in the nondistended thick-walled bladder. Stomach/Bowel: Normal caliber large and small bowel. No bowel wall thickening. Normal appendix. Stomach is within normal limits. Vascular/Lymphatic: IVC filter. Mild aortic atherosclerotic calcification. Bilateral iliac chain nodes are similar to decreased from prior and likely reactive. Reproductive: No acute abnormality. Other: Soft tissue thickening and fluid within the presacral space. No free intraperitoneal air. Musculoskeletal: No acute fracture. Similar appearance of the sacral decubitus ulcer  abutting the coccyx. Soft tissue thickening along the gluteal cleft without abscess. No soft tissue gas. Sclerosis of the spinous processes of the sacrum and resorption of the coccyx compatible with chronic osteomyelitis. Review of the MIP images confirms the above findings. IMPRESSION: 1. Negative for acute pulmonary embolism. 2. Question geographic hypoenhancement in the superior pole of the left kidney may be due to pyelonephritis versus artifact. 3. Periureteral stranding about the ureters as they cross the iliac vessels. Thick-walled urinary bladder decompressed about a Foley catheter. Correlate with urinalysis for cystitis and ascending urinary tract infection. 4. Unchanged left nephrolithiasis measuring up to 2.1 cm. 5. Similar sacral decubitus ulcer abutting the coccyx with findings of chronic osteomyelitis. No abscess. Aortic Atherosclerosis (ICD10-I70.0). Electronically Signed   By: Angelique Holm.D.  On: 10/03/2022 00:43   CT ABDOMEN PELVIS W CONTRAST  Result Date: 10/03/2022 CLINICAL DATA:  Intra-abdominal abscess. History of paraplegia. Sacral wound. Evaluate for signs of deeper abscess/source of infectious symptoms. Pulmonary embolism suspected. EXAM: CT ANGIOGRAPHY CHEST CT ABDOMEN AND PELVIS WITH CONTRAST TECHNIQUE: Multidetector CT imaging of the chest was performed using the standard protocol during bolus administration of intravenous contrast. Multiplanar CT image reconstructions and MIPs were obtained to evaluate the vascular anatomy. Multidetector CT imaging of the abdomen and pelvis was performed using the standard protocol during bolus administration of intravenous contrast. RADIATION DOSE REDUCTION: This exam was performed according to the departmental dose-optimization program which includes automated exposure control, adjustment of the mA and/or kV according to patient size and/or use of iterative reconstruction technique. CONTRAST:  OMNIPAQUE IOHEXOL 350 MG/ML SOLN  COMPARISON:  CT abdomen and pelvis 07/05/2022 and same day chest radiograph FINDINGS: CTA CHEST FINDINGS Cardiovascular: Negative for acute pulmonary embolism. No pericardial effusion. No aortic aneurysm or dissection. Mild aortic atherosclerotic calcification. Mediastinum/Nodes: Trachea and esophagus are unremarkable. No mediastinal or hilar adenopathy. Lungs/Pleura: No focal consolidation, pleural effusion, or pneumothorax. Musculoskeletal: No acute fracture. Partially visualized cervicothoracic spine fusion. Review of the MIP images confirms the above findings. CT ABDOMEN and PELVIS FINDINGS Hepatobiliary: Unremarkable. Pancreas: Unremarkable. Spleen: Unremarkable. Adrenals/Urinary Tract: Stable adrenal glands. Redemonstrated left nephrolithiasis with stone in the left renal pelvis measuring 2.1 cm. Question geographic hypoenhancement in the superior pole of the left kidney versus artifact. No hydronephrosis or obstructing calculi. Periureteral stranding about the ureters as they cross the iliac vessels. Foley catheter in the nondistended thick-walled bladder. Stomach/Bowel: Normal caliber large and small bowel. No bowel wall thickening. Normal appendix. Stomach is within normal limits. Vascular/Lymphatic: IVC filter. Mild aortic atherosclerotic calcification. Bilateral iliac chain nodes are similar to decreased from prior and likely reactive. Reproductive: No acute abnormality. Other: Soft tissue thickening and fluid within the presacral space. No free intraperitoneal air. Musculoskeletal: No acute fracture. Similar appearance of the sacral decubitus ulcer abutting the coccyx. Soft tissue thickening along the gluteal cleft without abscess. No soft tissue gas. Sclerosis of the spinous processes of the sacrum and resorption of the coccyx compatible with chronic osteomyelitis. Review of the MIP images confirms the above findings. IMPRESSION: 1. Negative for acute pulmonary embolism. 2. Question geographic  hypoenhancement in the superior pole of the left kidney may be due to pyelonephritis versus artifact. 3. Periureteral stranding about the ureters as they cross the iliac vessels. Thick-walled urinary bladder decompressed about a Foley catheter. Correlate with urinalysis for cystitis and ascending urinary tract infection. 4. Unchanged left nephrolithiasis measuring up to 2.1 cm. 5. Similar sacral decubitus ulcer abutting the coccyx with findings of chronic osteomyelitis. No abscess. Aortic Atherosclerosis (ICD10-I70.0). Electronically Signed   By: Minerva Fester M.D.   On: 10/03/2022 00:43   CT Soft Tissue Neck W Contrast  Result Date: 10/03/2022 CLINICAL DATA:  A soft tissue infection suspected EXAM: CT NECK WITH CONTRAST TECHNIQUE: Multidetector CT imaging of the neck was performed using the standard protocol following the bolus administration of intravenous contrast. RADIATION DOSE REDUCTION: This exam was performed according to the departmental dose-optimization program which includes automated exposure control, adjustment of the mA and/or kV according to patient size and/or use of iterative reconstruction technique. CONTRAST:  OMNIPAQUE IOHEXOL 350 MG/ML SOLN COMPARISON:  None Available. FINDINGS: Evaluation is somewhat limited by beam hardening from the patient's spinal hardware. Pharynx and larynx: Normal. No mass or swelling. Salivary glands: No inflammation,  mass, or stone. Thyroid: Normal. Lymph nodes: None enlarged or abnormal density. Vascular: Patent. Limited intracranial: Negative. Visualized orbits: Negative. Mastoids and visualized paranasal sinuses: Mild mucosal thickening in the ethmoid air cells and inferior right frontal sinus. The mastoids are well aerated. Skeleton: No acute fracture or suspicious osseous lesion. Status post posterior fusion from C4 through the imaged thoracic spine and off the inferior field of view. Upper chest: Please see same-day CT chest. Other: None. IMPRESSION:  No acute process in the neck. Electronically Signed   By: Wiliam Ke M.D.   On: 10/03/2022 00:25   CT Head Wo Contrast  Result Date: 10/03/2022 CLINICAL DATA:  Headache, new onset EXAM: CT HEAD WITHOUT CONTRAST TECHNIQUE: Contiguous axial images were obtained from the base of the skull through the vertex without intravenous contrast. RADIATION DOSE REDUCTION: This exam was performed according to the departmental dose-optimization program which includes automated exposure control, adjustment of the mA and/or kV according to patient size and/or use of iterative reconstruction technique. COMPARISON:  04/26/2020 FINDINGS: Brain: No evidence of acute infarction, hemorrhage, mass, mass effect, or midline shift. No hydrocephalus or extra-axial fluid collection. Vascular: No hyperdense vessel. Skull: Negative for fracture or focal lesion. Sinuses/Orbits: Mild mucosal thickening in the right anterior ethmoid air cells and inferior right frontal sinus. No acute finding in the orbits. Other: The mastoid air cells are well aerated. IMPRESSION: No acute intracranial process. Electronically Signed   By: Wiliam Ke M.D.   On: 10/03/2022 00:21   DG Chest 2 View  Result Date: 10/02/2022 CLINICAL DATA:  Suspected Sepsis.  Weakness. EXAM: CHEST - 2 VIEW COMPARISON:  07/05/2022. FINDINGS: Bilateral lung fields are clear. Bilateral costophrenic angles are clear. Normal cardio-mediastinal silhouette. No acute osseous abnormalities. Partially seen cervicothoracic spinal fixation hardware. The soft tissues are within normal limits. IMPRESSION: No active cardiopulmonary disease. Electronically Signed   By: Jules Schick M.D.   On: 10/02/2022 19:43      Assessment/Plan   Principal Problem:   UTI (urinary tract infection) Active Problems:   DM2 (diabetes mellitus, type 2) (HCC)   History of DVT (deep vein thrombosis)   Acute cystitis   Essential hypertension   Cough   Anemia of chronic disease      #)  Urinary tract infection: Presentation concerning for urinary tract infection complicated by potential left-sided pyelonephritis in the context of presenting 2 to 3 days of new onset chills, subjective fever, with presenting urinalysis demonstrating significant pyuria from male along with large leukocyte Estrace, in the absence of any squamous epithelial cells to suggest a contaminated specimen, while CT abdomen/pelvis showed bladder wall thickening concerning for acute cystitis as well as evidence of periureteral stranding concerning for ascending urinary tract infection along with hypoenhancement in the superior pole of the left kidney concerning for left-sided pyelonephritis.  Will evaluation for urinary tract infection in this patient is complicated by the presence of his chronic indwelling Foley catheter, the above radiographic findings, including the presence of bladder wall thickening as well as suggestion of left-sided pyelonephritis are new findings on today CT abdomen/pelvis relative to CT abdomen/pelvis performed on 07/05/2022, increasing index of suspicion for underlying urinary tract infection.  Will closely follow for results of urine culture, which was collected prior to initiation of IV antibiotics.  Of note, in the absence of leukocytosis in the absence of fever, SIRS criteria for sepsis not currently met.  Lactic acid level x 2 were nonelevated and trending down, as quantified above.  Blood cultures  x 2 were collected prior to initiation of IV vancomycin, cefepime, and Flagyl.  No evidence of any additional underlying infectious process at this time, including CTA chest which showed no evidence of aspiration pneumonia, which was initially a concern given the patient's report of new onset cough with consumption of water.  Additionally, COVID-19 PCR was negative.  In the context of a history of chronic stable decubitus ulcer complicated by chronic osteomyelitis, today CT abdomen/pelvis shows no  other interval change or findings that be suggestive of acute process nor any evidence of drainable fluid collection to suggest abscess.  As it appears that the patient's infection is limited to suspected underlying urinary tract source, will de-escalate IV antibiotics to Rocephin, closely monitor for results of urine cultures, as above.   Plan: Lactated Ringer's at 75 cc/h x 8 hours.  Rocephin.  Follow-up results of urine culture and blood cultures x 2, as above.  Repeat CBC with differential in the morning.  Add on ESR, CRP.  CMP in the morning.  Prn acetaminophen for fever.               #) cough: New onset prandial cough, nonproductive, starting over the course the last week concerning for potential aspiration, although today CT a chest shows no evidence of infiltrate or additional findings that would be consistent with aspiration.  Regardless, we will keep the patient n.p.o. for now, and formally consult speech therapy in the morning for formal swallow evaluation to further evaluate.  No clinical or radiographic evidence to suggest acutely decompensated heart failure, and CTA chest also showed no evidence of acute pulmonary embolism, which is rendered that much less likely in the setting of the patient's chronic anticoagulation on Eliquis.  Plan: N.p.o. for now.  SLP consult placed for formal swallow evaluation in the morning, as above.  Monitor strict I's and O's and daily weights.                   #) Type 2 Diabetes Mellitus: documented history of such. Home insulin regimen: Lantus 30 units SQ nightly. Home oral hypoglycemic agents: Glipizide, Januvia. presenting blood sugar: 144. Most recent A1c noted to be 8.1% when checked on 07/06/2022 patient's diabetes is complicated by a history of diabetic peripheral polyneuropathy for which she is on gabapentin at home.  in terms of initial dose of basal insulin to be started during this hospitalization, will resume  approximately half of outpatient dose in order to reduce risk for ensuing hypoglycemia   Plan: accuchecks QAC and HS with low dose SSI. hold home oral hypoglycemic agents during this hospitalization.  Lantus 12 units SQ nightly, as above.  Continue outpatient gabapentin.                   #) Essential Hypertension: documented h/o such, with outpatient antihypertensive regimen including lisinopril.  SBP's in the ED today: Initially soft, with initial systolic pressures in the high 80s, subsequently improving into the 130s mmHg following of IV fluids.  In the setting of soft initial blood pressures as well as suspected presenting acute infection, will hold home lisinopril for now.  Plan: Close monitoring of subsequent BP via routine VS. hold home lisinopril for now, as above.                 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  Subsequently, the patient is now chronically anticoagulated on Eliquis.  Of note, today CTA chest showed no evidence of acute process, including no evidence of acute pulmonary embolism.  Plan: Continue outpatient Eliquis.  Repeat CBC in the morning.               #) Anemia of chronic disease: Documented history of such, a/w with baseline hgb range 10-12, with presenting hgb consistent with this range, in the absence of any overt evidence of active bleed.     Plan: Repeat CBC in the morning.       DVT prophylaxis: SCD's + home eliquis  Code Status: Full code Family Communication: none Disposition Plan: Per Rounding Team Consults called: none;  Admission status: inpatient     I SPENT GREATER THAN 75  MINUTES IN CLINICAL CARE TIME/MEDICAL DECISION-MAKING IN COMPLETING THIS ADMISSION.      Chaney Born Hailey Stormer DO [facility]  From 7PM - 7AM   10/03/2022, 2:01 AM

## 2022-10-03 NOTE — Progress Notes (Signed)
Went to pt room, check with RN , I was told pt is NPO right now, she will let me know once diet change, by Orlan Leavens Spanish Medical Interpreter.

## 2022-10-03 NOTE — Plan of Care (Signed)
  Problem: Coping: Goal: Ability to adjust to condition or change in health will improve Outcome: Progressing   

## 2022-10-03 NOTE — Plan of Care (Signed)

## 2022-10-04 ENCOUNTER — Inpatient Hospital Stay (HOSPITAL_COMMUNITY): Payer: 59

## 2022-10-04 DIAGNOSIS — N3000 Acute cystitis without hematuria: Secondary | ICD-10-CM | POA: Diagnosis not present

## 2022-10-04 LAB — GLUCOSE, CAPILLARY
Glucose-Capillary: 129 mg/dL — ABNORMAL HIGH (ref 70–99)
Glucose-Capillary: 201 mg/dL — ABNORMAL HIGH (ref 70–99)
Glucose-Capillary: 125 mg/dL — ABNORMAL HIGH (ref 70–99)
Glucose-Capillary: 186 mg/dL — ABNORMAL HIGH (ref 70–99)

## 2022-10-04 MED ORDER — LISINOPRIL 10 MG PO TABS
10.0000 mg | ORAL_TABLET | Freq: Every day | ORAL | Status: DC
  Filled 2022-10-04: qty 1

## 2022-10-04 MED ORDER — PANTOPRAZOLE SODIUM 40 MG PO TBEC
40.0000 mg | DELAYED_RELEASE_TABLET | Freq: Every day | ORAL | Status: DC
  Administered 2022-10-04 – 2022-10-07 (×4): 40 mg via ORAL
  Filled 2022-10-04 (×4): qty 1

## 2022-10-04 MED ORDER — POLYVINYL ALCOHOL 1.4 % OP SOLN: 1.0000 [drp] | Freq: Every day | OPHTHALMIC | Status: DC | PRN

## 2022-10-04 MED ORDER — SODIUM CHLORIDE 0.9 % IV BOLUS
500.0000 mL | Freq: Once | INTRAVENOUS | Status: AC
  Administered 2022-10-04: 500 mL via INTRAVENOUS

## 2022-10-04 NOTE — Progress Notes (Signed)
Assumed care from offgoing RN 8/22 2300. Concurred with previous assessment. Patient endorses a medium amount of pain but shakes hand and states "esta bien" when offered PRN tylenol. Request made for coffee, provided by this RN.

## 2022-10-04 NOTE — Progress Notes (Signed)
Patient B/P was 78/56, rechecked manually 80/60, MD Hongalgi made aware, see new orders. Patient is stable, denied symptoms.

## 2022-10-04 NOTE — Evaluation (Signed)
Modified Barium Swallow Study  Patient Details  Name: [name] MRN: [id number] Date of Birth: Jul 25, 1965  Today's Date: 10/04/2022  Modified Barium Swallow completed.  Full report located under Chart Review in the Imaging Section.  History of Present Illness Denver Kibbey is a 57 y.o. male with medical history significant for paraplegia, chronic indwelling Foley catheter, chronic sacral decubitus ulcer complicated by chronic osteomyelitis, type 2 diabetes mellitus complicated by diabetic peripheral polyneuropathy, essential pretension, multiple prior DVTs, chronically anticoagulated on Eliquis, anemia of chronic disease, who is admitted to [facility] on 10/02/2022 with suspected urinary tract infection after presenting from home to [facility] ED complaining of chills.      The patient reports to 3 days of progressive chills in the absence of full body rigors or generalized myalgias.  In the context of a history of paraplegia, he has a chronic indwelling Foley catheter.  Denies any recent gross hematuria.  No new onset abdominal discomfort.     Over the last week, he has noted new onset coughing when attempting to drink water.  He denies any associated shortness of breath nor any cough at times other than when attempting to consume water. CXR 2 view negative. CT neck negative.   Clinical Impression Pt demonstrates adequate airway protection thought study. There is no overt oropharygneal dysphagia. Pt does have slightly altered pattern of swallowing: pt initiates laryngeal elevation but doesn't achieve full elevation, then hyoid excursion with bolus transit through pharynx as laryngeal elevation continues. This results in consistent flash penetration of thin liquids. No aspiration, no weakness no residue. No explanation for pts report of phlegm with thin liquids or globus with solids. Continue to suspect an esophageal/GER etiology though esophageal sweep with barium tablet unrevealing. Will sign  off for swallowing. Factors that may increase risk of adverse event in presence of aspiration [employer] 2021):    Swallow Evaluation Recommendations Recommendations: PO diet PO Diet Recommendation: Regular;Thin liquids (Level 0) Liquid Administration via: Cup;Straw Medication Administration: Whole meds with liquid Supervision: Patient able to self-feed Postural changes: Position pt fully upright for meals Oral care recommendations: Oral care BID (2x/day)    Harlon Ditty, MA CCC-SLP  Acute Rehabilitation Services Secure Chat Preferred Office [phone]   Claudine Mouton 10/04/2022,11:45 AM

## 2022-10-04 NOTE — Progress Notes (Signed)
Ordered pt meals including lunch and dinner, by Orlan Leavens Spanish Medical Interpreter.

## 2022-10-04 NOTE — Progress Notes (Addendum)
PROGRESS NOTE   [name]  OZH:[id number]    DOB: May 06, 1965    DOA: 10/02/2022  PCP: Valerie Roys, FNP   I have briefly reviewed patients previous medical records in [facility].  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  Per patient and family report, has had issues with Foley catheter being repeatedly clogged lately, changed 1 week PTA, noted progressive chills, coughing with oral liquid intake but no dyspnea.  Admitted for suspected complicated/catheter associated UTI.   Assessment & Plan:  Principal Problem:   UTI (urinary tract infection) Active Problems:   DM2 (diabetes mellitus, type 2) (HCC)   History of DVT (deep vein thrombosis)   Acute cystitis   Essential hypertension   Cough   Anemia of chronic disease   Complicated/catheter associated UTI and pyelonephritis Chronic indwelling Foley catheter, recently clogged requiring multiple exchanges, most recent exchange was 7 days PTA. Blood cultures x 2: NTD.  Urine culture is not helpful, shows multiple species and likely contaminant. IV cefepime/metronidazole/vancomycin x 1 followed by IV ceftriaxone.  Continue IV ceftriaxone for additional day tomorrow and then can hopefully transition to empiric oral antibiotics at discharge.  DC IV fluids. No high-grade fever or leukocytosis. CT abdomen on admission: Question left kidney pyelonephritis versus artifact,  periureteral stranding about the ureters, thick-walled urinary bladder.  Type II DM: Mildly uncontrolled and fluctuating but reasonable inpatient control.  Continue Semglee 12 units at bedtime and sensitive SSI.  On gabapentin, continue  HTN/hypotension: Resume home lisinopril  Addendum: This afternoon prior to giving lisinopril, RN noticed hypotension with SBP's in the 80s.  Lisinopril discontinued.  500 mL IV normal saline bolus and monitor.  Clinically appeared euvolemic during my exam.?  Autonomic dysfunction.  Paroxysmal A-fib/multiple prior DVTs: Continue Eliquis  Anemia of chronic disease Stable  Cough: Evaluated by SLP on 8/22 who recommended regular consistency diet and thin liquids, continue.  CT soft tissue of the neck, CT head without acute findings.  CTA chest with no focal consolidation.  Body mass index is 37.05 kg/m./Severe Morbid Obesity Noted.  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 chronic (Active)  10/03/22 0810  Location: Coccyx  Location Orientation:   Staging: Stage 4 - Full thickness tissue loss with exposed bone, tendon or muscle.  Wound Description (Comments): chronic  Present on Admission: Yes  Dressing Type Moisture barrier;Gauze (Comment);Foam - Lift dressing to assess site every shift 10/04/22 1038  Agree with above documentation.  Personally examined the wound with patient's RN on 8/23 and took pictures as documented below. CT abdomen showed sacral decubitus ulcer abutting the coccyx with findings of chronic osteomyelitis and no abscess.  ACP Documents: None present DVT prophylaxis: SCDs Start: 10/03/22 0148     Code Status: Full Code:  Family Communication: Spouse at the side via the bedside video interpreter Disposition:  Status is: Inpatient Remains inpatient  appropriate because:  IV antibiotics     Consultants:    None Procedures:     Antimicrobials:    As above  Subjective:  Patient interviewed and examined along with a video interpreter at bedside.  Spouse and patient's RN in the room.  Patient denies complaints.  States that he has a sensory level from upper chest down.  Rest of history as noted above  Objective:   Vitals:   10/04/22 0658 10/04/22 0753 10/04/22 0817 10/04/22 1050  BP: (!) 98/59 92/64 129/71 126/75  Pulse: 66 78 91 85  Resp: 15 17 16 16   Temp:  (!) 97.4 F (36.3 C)    TempSrc:  Oral    SpO2: 100% 99% 100% 94%  Weight:      Height:        General exam: Young male, moderately built and nourished lying comfortably supine in bed without distress. Respiratory system: Clear to auscultation. Respiratory effort normal. Cardiovascular system: S1 & S2 heard, RRR. No JVD, murmurs, rubs, gallops or clicks. No pedal edema. Gastrointestinal system: Abdomen is nondistended, soft and nontender. No organomegaly or masses felt. Normal bowel sounds heard. Central nervous system: Alert and oriented. No focal neurological deficits.  Has sensory level from upper chest down. Extremities: Grade 0 x 5 power in bilateral lower extremities and graded 4 x 5 power in upper extremities Skin: Large stage IV sacral decubitus ulcer, clean and without evidence of infection.  Details of this ulcer as an WOC RN note from 8/22.  Details also has an picture below from 8/23 Psychiatry: Judgement and insight appear normal. Mood & affect appropriate.     Data Reviewed:   I have personally reviewed following labs and imaging studies   CBC: Recent Labs  Lab 10/02/22 1811 10/03/22 0419  WBC 10.1 9.0  NEUTROABS 6.9 6.5  HGB 11.4* 10.6*  HCT 37.2* 33.7*  MCV 83.4 84.7  PLT 434* 379    Basic Metabolic Panel: Recent Labs  Lab 10/02/22 1811 10/03/22 0419  NA 134* 133*  K 3.8 3.8  CL 101 101  CO2 21* 23  GLUCOSE 144* 161*  BUN 8 7  CREATININE 0.75 0.72   CALCIUM 8.9 8.6*  MG  --  1.9    Liver Function Tests: Recent Labs  Lab 10/02/22 1811 10/03/22 0419  AST 21 17  ALT 19 17  ALKPHOS 61 56  BILITOT 0.5 0.5  PROT 8.6* 7.7  ALBUMIN 2.9* 2.5*    CBG: Recent Labs  Lab 10/03/22 2115 10/04/22 0609 10/04/22 1042  GLUCAP 161* 129* 201*    Microbiology Studies:   Recent Results (from the past 240 hour(s))  Culture, blood (Routine x 2)     Status: None (Preliminary result)   Collection Time: 10/02/22  6:11 PM   Specimen: BLOOD RIGHT HAND  Result Value Ref Range Status   Specimen Description BLOOD RIGHT HAND  Final   Special Requests   Final    BOTTLES DRAWN AEROBIC AND ANAEROBIC Blood Culture adequate volume   Culture   Final    NO GROWTH 2 DAYS Performed at [facility], [address], [place], [place] [address]    Report Status PENDING  Incomplete  Culture, blood (Routine x 2)     Status: None (Preliminary result)   Collection Time: 10/02/22  7:46 PM   Specimen: BLOOD RIGHT ARM  Result Value Ref Range Status   Specimen Description BLOOD RIGHT ARM  Final   Special Requests  Final    BOTTLES DRAWN AEROBIC AND ANAEROBIC Blood Culture results may not be optimal due to an inadequate volume of blood received in culture bottles   Culture   Final    NO GROWTH 2 DAYS Performed at [facility], [address], [place], [place] [address]    Report Status PENDING  Incomplete  Urine Culture     Status: Abnormal   Collection Time: 10/02/22  8:54 PM   Specimen: Urine, Random  Result Value Ref Range Status   Specimen Description URINE, RANDOM  Final   Special Requests   Final    NONE Reflexed from [id number] Performed at [facility], [address], [place], [place] [address]    Culture MULTIPLE SPECIES PRESENT, SUGGEST RECOLLECTION (A)  Final   Report Status 10/03/2022 FINAL  Final  SARS Coronavirus 2 by RT PCR (hospital order, performed in [facility] hospital lab) *cepheid single result test* Anterior  Nasal Swab     Status: None   Collection Time: 10/02/22 10:24 PM   Specimen: Anterior Nasal Swab  Result Value Ref Range Status   SARS Coronavirus 2 by RT PCR NEGATIVE NEGATIVE Final    Comment: Performed at [facility], [address] [place], [place] [address]    Radiology Studies:  CT Angio Chest PE W and/or Wo Contrast  Result Date: 10/03/2022 CLINICAL DATA:  Intra-abdominal abscess. History of paraplegia. Sacral wound. Evaluate for signs of deeper abscess/source of infectious symptoms. Pulmonary embolism suspected. EXAM: CT ANGIOGRAPHY CHEST CT ABDOMEN AND PELVIS WITH CONTRAST TECHNIQUE: Multidetector CT imaging of the chest was performed using the standard protocol during bolus administration of intravenous contrast. Multiplanar CT image reconstructions and MIPs were obtained to evaluate the vascular anatomy. Multidetector CT imaging of the abdomen and pelvis was performed using the standard protocol during bolus administration of intravenous contrast. RADIATION DOSE REDUCTION: This exam was performed according to the departmental dose-optimization program which includes automated exposure control, adjustment of the mA and/or kV according to patient size and/or use of iterative reconstruction technique. CONTRAST:  OMNIPAQUE IOHEXOL 350 MG/ML SOLN COMPARISON:  CT abdomen and pelvis 07/05/2022 and same day chest radiograph FINDINGS: CTA CHEST FINDINGS Cardiovascular: Negative for acute pulmonary embolism. No pericardial effusion. No aortic aneurysm or dissection. Mild aortic atherosclerotic calcification. Mediastinum/Nodes: Trachea and esophagus are unremarkable. No mediastinal or hilar adenopathy. Lungs/Pleura: No focal consolidation, pleural effusion, or pneumothorax. Musculoskeletal: No acute fracture. Partially visualized cervicothoracic spine fusion. Review of the MIP images confirms the above findings. CT ABDOMEN and PELVIS FINDINGS Hepatobiliary: Unremarkable. Pancreas:  Unremarkable. Spleen: Unremarkable. Adrenals/Urinary Tract: Stable adrenal glands. Redemonstrated left nephrolithiasis with stone in the left renal pelvis measuring 2.1 cm. Question geographic hypoenhancement in the superior pole of the left kidney versus artifact. No hydronephrosis or obstructing calculi. Periureteral stranding about the ureters as they cross the iliac vessels. Foley catheter in the nondistended thick-walled bladder. Stomach/Bowel: Normal caliber large and small bowel. No bowel wall thickening. Normal appendix. Stomach is within normal limits. Vascular/Lymphatic: IVC filter. Mild aortic atherosclerotic calcification. Bilateral iliac chain nodes are similar to decreased from prior and likely reactive. Reproductive: No acute abnormality. Other: Soft tissue thickening and fluid within the presacral space. No free intraperitoneal air. Musculoskeletal: No acute fracture. Similar appearance of the sacral decubitus ulcer abutting the coccyx. Soft tissue thickening along the gluteal cleft without abscess. No soft tissue gas. Sclerosis of the spinous processes of the sacrum and resorption of the coccyx compatible with chronic  osteomyelitis. Review of the MIP images confirms the above findings. IMPRESSION: 1. Negative for acute pulmonary embolism. 2. Question geographic hypoenhancement in the superior pole of the left kidney may be due to pyelonephritis versus artifact. 3. Periureteral stranding about the ureters as they cross the iliac vessels. Thick-walled urinary bladder decompressed about a Foley catheter. Correlate with urinalysis for cystitis and ascending urinary tract infection. 4. Unchanged left nephrolithiasis measuring up to 2.1 cm. 5. Similar sacral decubitus ulcer abutting the coccyx with findings of chronic osteomyelitis. No abscess. Aortic Atherosclerosis (ICD10-I70.0). Electronically Signed   By: Minerva Fester M.D.   On: 10/03/2022 00:43   CT ABDOMEN PELVIS W CONTRAST  Result Date:  10/03/2022 CLINICAL DATA:  Intra-abdominal abscess. History of paraplegia. Sacral wound. Evaluate for signs of deeper abscess/source of infectious symptoms. Pulmonary embolism suspected. EXAM: CT ANGIOGRAPHY CHEST CT ABDOMEN AND PELVIS WITH CONTRAST TECHNIQUE: Multidetector CT imaging of the chest was performed using the standard protocol during bolus administration of intravenous contrast. Multiplanar CT image reconstructions and MIPs were obtained to evaluate the vascular anatomy. Multidetector CT imaging of the abdomen and pelvis was performed using the standard protocol during bolus administration of intravenous contrast. RADIATION DOSE REDUCTION: This exam was performed according to the departmental dose-optimization program which includes automated exposure control, adjustment of the mA and/or kV according to patient size and/or use of iterative reconstruction technique. CONTRAST:  OMNIPAQUE IOHEXOL 350 MG/ML SOLN COMPARISON:  CT abdomen and pelvis 07/05/2022 and same day chest radiograph FINDINGS: CTA CHEST FINDINGS Cardiovascular: Negative for acute pulmonary embolism. No pericardial effusion. No aortic aneurysm or dissection. Mild aortic atherosclerotic calcification. Mediastinum/Nodes: Trachea and esophagus are unremarkable. No mediastinal or hilar adenopathy. Lungs/Pleura: No focal consolidation, pleural effusion, or pneumothorax. Musculoskeletal: No acute fracture. Partially visualized cervicothoracic spine fusion. Review of the MIP images confirms the above findings. CT ABDOMEN and PELVIS FINDINGS Hepatobiliary: Unremarkable. Pancreas: Unremarkable. Spleen: Unremarkable. Adrenals/Urinary Tract: Stable adrenal glands. Redemonstrated left nephrolithiasis with stone in the left renal pelvis measuring 2.1 cm. Question geographic hypoenhancement in the superior pole of the left kidney versus artifact. No hydronephrosis or obstructing calculi. Periureteral stranding about the ureters as they cross the  iliac vessels. Foley catheter in the nondistended thick-walled bladder. Stomach/Bowel: Normal caliber large and small bowel. No bowel wall thickening. Normal appendix. Stomach is within normal limits. Vascular/Lymphatic: IVC filter. Mild aortic atherosclerotic calcification. Bilateral iliac chain nodes are similar to decreased from prior and likely reactive. Reproductive: No acute abnormality. Other: Soft tissue thickening and fluid within the presacral space. No free intraperitoneal air. Musculoskeletal: No acute fracture. Similar appearance of the sacral decubitus ulcer abutting the coccyx. Soft tissue thickening along the gluteal cleft without abscess. No soft tissue gas. Sclerosis of the spinous processes of the sacrum and resorption of the coccyx compatible with chronic osteomyelitis. Review of the MIP images confirms the above findings. IMPRESSION: 1. Negative for acute pulmonary embolism. 2. Question geographic hypoenhancement in the superior pole of the left kidney may be due to pyelonephritis versus artifact. 3. Periureteral stranding about the ureters as they cross the iliac vessels. Thick-walled urinary bladder decompressed about a Foley catheter. Correlate with urinalysis for cystitis and ascending urinary tract infection. 4. Unchanged left nephrolithiasis measuring up to 2.1 cm. 5. Similar sacral decubitus ulcer abutting the coccyx with findings of chronic osteomyelitis. No abscess. Aortic Atherosclerosis (ICD10-I70.0). Electronically Signed   By: Minerva Fester M.D.   On: 10/03/2022 00:43   CT Soft Tissue Neck W Contrast  Result Date: 10/03/2022  CLINICAL DATA:  A soft tissue infection suspected EXAM: CT NECK WITH CONTRAST TECHNIQUE: Multidetector CT imaging of the neck was performed using the standard protocol following the bolus administration of intravenous contrast. RADIATION DOSE REDUCTION: This exam was performed according to the departmental dose-optimization program which includes automated  exposure control, adjustment of the mA and/or kV according to patient size and/or use of iterative reconstruction technique. CONTRAST:  OMNIPAQUE IOHEXOL 350 MG/ML SOLN COMPARISON:  None Available. FINDINGS: Evaluation is somewhat limited by beam hardening from the patient's spinal hardware. Pharynx and larynx: Normal. No mass or swelling. Salivary glands: No inflammation, mass, or stone. Thyroid: Normal. Lymph nodes: None enlarged or abnormal density. Vascular: Patent. Limited intracranial: Negative. Visualized orbits: Negative. Mastoids and visualized paranasal sinuses: Mild mucosal thickening in the ethmoid air cells and inferior right frontal sinus. The mastoids are well aerated. Skeleton: No acute fracture or suspicious osseous lesion. Status post posterior fusion from C4 through the imaged thoracic spine and off the inferior field of view. Upper chest: Please see same-day CT chest. Other: None. IMPRESSION: No acute process in the neck. Electronically Signed   By: Wiliam Ke M.D.   On: 10/03/2022 00:25   CT Head Wo Contrast  Result Date: 10/03/2022 CLINICAL DATA:  Headache, new onset EXAM: CT HEAD WITHOUT CONTRAST TECHNIQUE: Contiguous axial images were obtained from the base of the skull through the vertex without intravenous contrast. RADIATION DOSE REDUCTION: This exam was performed according to the departmental dose-optimization program which includes automated exposure control, adjustment of the mA and/or kV according to patient size and/or use of iterative reconstruction technique. COMPARISON:  04/26/2020 FINDINGS: Brain: No evidence of acute infarction, hemorrhage, mass, mass effect, or midline shift. No hydrocephalus or extra-axial fluid collection. Vascular: No hyperdense vessel. Skull: Negative for fracture or focal lesion. Sinuses/Orbits: Mild mucosal thickening in the right anterior ethmoid air cells and inferior right frontal sinus. No acute finding in the orbits. Other: The mastoid  air cells are well aerated. IMPRESSION: No acute intracranial process. Electronically Signed   By: Wiliam Ke M.D.   On: 10/03/2022 00:21   DG Chest 2 View  Result Date: 10/02/2022 CLINICAL DATA:  Suspected Sepsis.  Weakness. EXAM: CHEST - 2 VIEW COMPARISON:  07/05/2022. FINDINGS: Bilateral lung fields are clear. Bilateral costophrenic angles are clear. Normal cardio-mediastinal silhouette. No acute osseous abnormalities. Partially seen cervicothoracic spinal fixation hardware. The soft tissues are within normal limits. IMPRESSION: No active cardiopulmonary disease. Electronically Signed   By: Jules Schick M.D.   On: 10/02/2022 19:43    Scheduled Meds:    apixaban  5 mg Oral BID   Chlorhexidine Gluconate Cloth  6 each Topical Daily   FLUoxetine  20 mg Oral QHS   gabapentin  600 mg Oral TID   insulin aspart  0-9 Units Subcutaneous TID WC   insulin glargine-yfgn  12 Units Subcutaneous QHS   pantoprazole  40 mg Oral Daily    Continuous Infusions:    cefTRIAXone (ROCEPHIN)  IV 1 g (10/04/22 0826)     LOS: 1 day     Marcellus Scott, MD,  FACP, FHM, SFHM, [facility], [facility]   [facility] [place]      To contact the attending provider between 7A-7P or the covering provider during after hours 7P-7A, please log into the web site [email or web address] and access using universal [place] password for that web site. If you do not have the password, please call the hospital operator.  10/04/2022,  11:17 AM

## 2022-10-04 NOTE — Plan of Care (Signed)
  Problem: Education: Goal: Ability to describe self-care measures that may prevent or decrease complications (Diabetes Survival Skills Education) will improve Outcome: Progressing Goal: Individualized Educational Video(s) Outcome: Progressing   Problem: Coping: Goal: Ability to adjust to condition or change in health will improve Outcome: Progressing   Problem: Fluid Volume: Goal: Ability to maintain a balanced intake and output will improve Outcome: Progressing   Problem: Health Behavior/Discharge Planning: Goal: Ability to identify and utilize available resources and services will improve Outcome: Progressing Goal: Ability to manage health-related needs will improve Outcome: Progressing   Problem: Metabolic: Goal: Ability to maintain appropriate glucose levels will improve Outcome: Progressing   Problem: Nutritional: Goal: Maintenance of adequate nutrition will improve Outcome: Progressing Goal: Progress toward achieving an optimal weight will improve Outcome: Progressing   Problem: Skin Integrity: Goal: Risk for impaired skin integrity will decrease Outcome: Progressing   Problem: Tissue Perfusion: Goal: Adequacy of tissue perfusion will improve Outcome: Progressing   Problem: Education: Goal: Knowledge of General Education information will improve Description: Including pain rating scale, medication(s)/side effects and non-pharmacologic comfort measures Outcome: Progressing   Problem: Health Behavior/Discharge Planning: Goal: Ability to manage health-related needs will improve Outcome: Progressing   Problem: Clinical Measurements: Goal: Ability to maintain clinical measurements within normal limits will improve Outcome: Progressing Goal: Will remain free from infection Outcome: Progressing

## 2022-10-05 DIAGNOSIS — Z794 Long term (current) use of insulin: Secondary | ICD-10-CM

## 2022-10-05 DIAGNOSIS — N10 Acute tubulo-interstitial nephritis: Secondary | ICD-10-CM

## 2022-10-05 DIAGNOSIS — Z86718 Personal history of other venous thrombosis and embolism: Secondary | ICD-10-CM

## 2022-10-05 DIAGNOSIS — I10 Essential (primary) hypertension: Secondary | ICD-10-CM

## 2022-10-05 DIAGNOSIS — G8250 Quadriplegia, unspecified: Secondary | ICD-10-CM | POA: Diagnosis not present

## 2022-10-05 DIAGNOSIS — E1142 Type 2 diabetes mellitus with diabetic polyneuropathy: Secondary | ICD-10-CM | POA: Diagnosis not present

## 2022-10-05 DIAGNOSIS — L89156 Pressure-induced deep tissue damage of sacral region: Secondary | ICD-10-CM

## 2022-10-05 DIAGNOSIS — D638 Anemia in other chronic diseases classified elsewhere: Secondary | ICD-10-CM

## 2022-10-05 LAB — CBC
HCT: 32.1 % — ABNORMAL LOW (ref 39.0–52.0)
Hemoglobin: 9.9 g/dL — ABNORMAL LOW (ref 13.0–17.0)
MCH: 25.8 pg — ABNORMAL LOW (ref 26.0–34.0)
MCHC: 30.8 g/dL (ref 30.0–36.0)
MCV: 83.6 fL (ref 80.0–100.0)
Platelets: 346 10*3/uL (ref 150–400)
RBC: 3.84 MIL/uL — ABNORMAL LOW (ref 4.22–5.81)
RDW: 14.4 % (ref 11.5–15.5)
WBC: 6.3 10*3/uL (ref 4.0–10.5)
nRBC: 0 % (ref 0.0–0.2)

## 2022-10-05 LAB — GLUCOSE, CAPILLARY
Glucose-Capillary: 116 mg/dL — ABNORMAL HIGH (ref 70–99)
Glucose-Capillary: 157 mg/dL — ABNORMAL HIGH (ref 70–99)
Glucose-Capillary: 137 mg/dL — ABNORMAL HIGH (ref 70–99)
Glucose-Capillary: 145 mg/dL — ABNORMAL HIGH (ref 70–99)

## 2022-10-05 MED ORDER — BISACODYL 10 MG RE SUPP
10.0000 mg | Freq: Every day | RECTAL | Status: DC | PRN
  Administered 2022-10-05: 10 mg via RECTAL
  Filled 2022-10-05: qty 1

## 2022-10-05 NOTE — Hospital Course (Addendum)
[name] was admitted to the hospital with the working diagnosis pyelonephritis/ catheter associated urinary tract infection.    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  Per patient and family report, has had issues with Foley catheter being repeatedly clogged lately, changed 1 week PTA, noted progressive chills, coughing with oral liquid intake but no dyspnea.

## 2022-10-05 NOTE — Assessment & Plan Note (Signed)
Patient was placed on insulin therapy during his hospitalization, sliding scale and basal with good toleration.  At discharge will resume home regimen with glipizide and basal insulin.

## 2022-10-05 NOTE — Assessment & Plan Note (Addendum)
Patient with limited mobility at home. Has a Art gallery manager.  Continue pain control with gabapentin, hydrocodone and baclofen.    Obesity class 3 with a calculated BMI of 37.

## 2022-10-05 NOTE — Assessment & Plan Note (Signed)
Urine infection associated to chronic indwelling foley catheter present on admission.  Severe sepsis (present on admission) end organ failure hypotension.   10/02/22 urine culture with multiple species.  07/05/22  E Choli and Proteus mirabilis, (100,000 CFU each). E coli resistant to cephalosporins and Proteus resistant to bactrim.   CT with hypo-enhancement in the superior pole of the left kidney, pyelonephritis or artifact. Periureteral stranding about the ureters as they cross the iliac vessels. Thick walled urinary bladder decompressed about a Foley catheter.   Patient was treated with ceftriaxone with good toleration, he has been afebrile and no leukocytosis.  Blood cultures have been negative.   Based on prior cultures, will continue antibiotic therapy with Augmentin for 7 more days.  Continue with Foley care.

## 2022-10-05 NOTE — Progress Notes (Signed)
  Progress Note   Patient: [name] HKV:[id number] DOB: October 15, 1965 DOA: 10/02/2022     2 DOS: the patient was seen and examined on 10/05/2022   Brief hospital course: Mr. Malia was admitted to the hospital with the working diagnosis pyelonephritis/ catheter associated urinary tract infection.    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  Per patient and family report, has had issues with Foley catheter being repeatedly clogged lately, changed 1 week PTA, noted progressive chills, coughing with oral liquid intake but no dyspnea.   Assessment and Plan: * Acute pyelonephritis 10/02/22 urine culture with multiple species.  07/05/22  E Choli and Proteus mirabilis, (100,000 CFU each). E coli resistant to cephalosporins and Proteus resistant to bactrim.   Wbc is 6,4 and afebrile.  Blood cultures with no growth.  CT with hypo-enhancement in the superior pole of the left kidney, pyelonephritis or artifact. Periureteral stranding about the ureters as they cross the iliac vessels. Thick walled urinary bladder decompressed about a Foley catheter.   Plan to continue antibiotic therapy with ceftriaxone.  Follow up cell count and temperature curve.    Quadriplegia [facility]) Patient with limited mobility at home. Has a Art gallery manager.   Obesity class 3 with a calculated BMI of 37.   Essential hypertension Blood pressure has been controlled.   DM2 (diabetes mellitus, type 2) (HCC) Continue glucose cover and monitoring with insulin sliding  scale and basal insulin 12 units.   History of DVT (deep vein thrombosis) Continue anticoagulation with apixaban. Had history of IVC filter.   Anemia of chronic disease Hgb has been stable.   Pressure injury of skin Continue local wound care.  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  Wound care.        Subjective: Patient with no chest pain or dyspnea, tolerating po well.   Physical Exam: Vitals:   10/05/22 0330 10/05/22 0510 10/05/22 0735 10/05/22 1131  BP: 97/60   (!) 100/56  Pulse: 77  76 72  Resp: 18     Temp: 97.9 F (36.6 C)   97.9 F (36.6 C)  TempSrc: Oral  Oral Oral  SpO2: 99%  99% 98%  Weight:  107.3 kg    Height:       Neurology awake and alert ENT with mild pallor Cardiovascular with S1 and S2 present and regular Respiratory with no rales or wheezing Abdomen with no distention  No lower extremity edema Positive pressure ulcer. Coccyx.  Data Reviewed:    Family Communication: no family at the bedside   Disposition: Status is: Inpatient Remains inpatient appropriate because: IV antibiotic therapy   Planned Discharge Destination: Home     Author: Coralie Keens, MD 10/05/2022 1:27 PM  For on call review [email or web address].

## 2022-10-05 NOTE — Assessment & Plan Note (Signed)
Blood pressure has been controlled.  Hold lisinopril to avoid hypotension.

## 2022-10-05 NOTE — Assessment & Plan Note (Signed)
Continue local wound care.  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  Wound care.

## 2022-10-05 NOTE — Assessment & Plan Note (Signed)
Continue anticoagulation with apixaban. Had history of IVC filter.

## 2022-10-05 NOTE — Assessment & Plan Note (Signed)
Hgb has been stable.   

## 2022-10-06 DIAGNOSIS — E1142 Type 2 diabetes mellitus with diabetic polyneuropathy: Secondary | ICD-10-CM | POA: Diagnosis not present

## 2022-10-06 DIAGNOSIS — G8250 Quadriplegia, unspecified: Secondary | ICD-10-CM | POA: Diagnosis not present

## 2022-10-06 DIAGNOSIS — I10 Essential (primary) hypertension: Secondary | ICD-10-CM | POA: Diagnosis not present

## 2022-10-06 DIAGNOSIS — N10 Acute tubulo-interstitial nephritis: Secondary | ICD-10-CM | POA: Diagnosis not present

## 2022-10-06 LAB — CBC
HCT: 32.1 % — ABNORMAL LOW (ref 39.0–52.0)
Hemoglobin: 10.3 g/dL — ABNORMAL LOW (ref 13.0–17.0)
MCH: 27 pg (ref 26.0–34.0)
MCHC: 32.1 g/dL (ref 30.0–36.0)
MCV: 84 fL (ref 80.0–100.0)
Platelets: 364 10*3/uL (ref 150–400)
RBC: 3.82 MIL/uL — ABNORMAL LOW (ref 4.22–5.81)
RDW: 14.2 % (ref 11.5–15.5)
WBC: 7.6 10*3/uL (ref 4.0–10.5)
nRBC: 0 % (ref 0.0–0.2)

## 2022-10-06 LAB — BASIC METABOLIC PANEL
Anion gap: 9 (ref 5–15)
BUN: 8 mg/dL (ref 6–20)
CO2: 23 mmol/L (ref 22–32)
Calcium: 8.6 mg/dL — ABNORMAL LOW (ref 8.9–10.3)
Chloride: 101 mmol/L (ref 98–111)
Creatinine, Ser: 0.65 mg/dL (ref 0.61–1.24)
GFR, Estimated: >60 mL/min (ref >60)
Glucose, Bld: 113 mg/dL — ABNORMAL HIGH (ref 70–99)
Potassium: 3.6 mmol/L (ref 3.5–5.1)
Sodium: 133 mmol/L — ABNORMAL LOW (ref 135–145)

## 2022-10-06 LAB — GLUCOSE, CAPILLARY
Glucose-Capillary: 146 mg/dL — ABNORMAL HIGH (ref 70–99)
Glucose-Capillary: 141 mg/dL — ABNORMAL HIGH (ref 70–99)
Glucose-Capillary: 133 mg/dL — ABNORMAL HIGH (ref 70–99)
Glucose-Capillary: 196 mg/dL — ABNORMAL HIGH (ref 70–99)

## 2022-10-06 NOTE — Progress Notes (Signed)
  Progress Note   Patient: [name]:[id number] DOB: 1965/10/10 DOA: 10/02/2022     3 DOS: the patient was seen and examined on 10/06/2022   Brief hospital course: [name] was admitted to the hospital with the working diagnosis pyelonephritis/ catheter associated urinary tract infection.    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  Per patient and family report, has had issues with Foley catheter being repeatedly clogged lately, changed 1 week PTA, noted progressive chills, coughing with oral liquid intake but no dyspnea.   Assessment and Plan: * Acute pyelonephritis Urine infection associated to chronic indwelling foley catheter present on admission.   10/02/22 urine culture with multiple species.  07/05/22  E Choli and Proteus mirabilis, (100,000 CFU each). E coli resistant to cephalosporins and Proteus resistant to bactrim.   CT with hypo-enhancement in the superior pole of the left kidney, pyelonephritis or artifact. Periureteral stranding about the ureters as they cross the iliac vessels. Thick walled urinary bladder decompressed about a Foley catheter.   Patient continue afebrile. Wbc is 7.6   Plan to continue antibiotic therapy with ceftriaxone and transition to po antibiotic therapy in am.   Continue with Foley care.   Quadriplegia [facility]) Patient with limited mobility at home. Has a Art gallery manager.   Obesity class 3 with a calculated BMI of 37.   Essential hypertension Blood pressure has been controlled.    DM2 (diabetes mellitus, type 2) (HCC) Continue glucose cover and monitoring with insulin sliding scale and basal insulin 12 units.   History of DVT (deep vein thrombosis) Continue anticoagulation with apixaban. Had history of IVC filter.   Anemia of chronic disease Hgb has been stable.   Pressure injury of skin Continue local wound care.  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  Wound care.    Subjective: Patient is feeling better, close to his baseline, positive bowel movement this morning   Physical Exam: Vitals:   10/06/22 0451 10/06/22 0837 10/06/22 1000 10/06/22 1130  BP: 103/64 95/66 100/67 103/69  Pulse: 82  84 79  Resp: 14 16 18 20   Temp: 97.8 F (36.6 C)  97.7 F (36.5 C) 97.9 F (36.6 C)  TempSrc: Oral  Oral Oral  SpO2:  99% 96% 96%  Weight: 107.2 kg     Height:       Neurology awake and alert ENT with mild pallor Cardiovascular with S1 and S2 present and regular with no gallops or murmurs Respiratory with no rales or wheezing, on anterior auscultation  Abdomen with no distention  No lower extremity edema  Data Reviewed:    Family Communication: no family at the bedside   Disposition: Status is: Inpatient Remains inpatient appropriate because: IV antibiotic therapy, possible discharge home tomorrow   Planned Discharge Destination: Home     Author: Coralie Keens, MD 10/06/2022 12:19 PM  For on call review [email or web address].

## 2022-10-06 NOTE — Plan of Care (Signed)
  Problem: Skin Integrity: Goal: Risk for impaired skin integrity will decrease Outcome: Progressing   

## 2022-10-06 NOTE — Plan of Care (Signed)

## 2022-10-07 ENCOUNTER — Other Ambulatory Visit (HOSPITAL_COMMUNITY): Payer: Self-pay

## 2022-10-07 DIAGNOSIS — N10 Acute tubulo-interstitial nephritis: Secondary | ICD-10-CM | POA: Diagnosis not present

## 2022-10-07 DIAGNOSIS — I10 Essential (primary) hypertension: Secondary | ICD-10-CM | POA: Diagnosis not present

## 2022-10-07 DIAGNOSIS — E1142 Type 2 diabetes mellitus with diabetic polyneuropathy: Secondary | ICD-10-CM | POA: Diagnosis not present

## 2022-10-07 DIAGNOSIS — G8250 Quadriplegia, unspecified: Secondary | ICD-10-CM | POA: Diagnosis not present

## 2022-10-07 LAB — GLUCOSE, CAPILLARY
Glucose-Capillary: 134 mg/dL — ABNORMAL HIGH (ref 70–99)
Glucose-Capillary: 138 mg/dL — ABNORMAL HIGH (ref 70–99)
Glucose-Capillary: 140 mg/dL — ABNORMAL HIGH (ref 70–99)

## 2022-10-07 LAB — BASIC METABOLIC PANEL
Anion gap: 11 (ref 5–15)
BUN: 11 mg/dL (ref 6–20)
CO2: 24 mmol/L (ref 22–32)
Calcium: 8.6 mg/dL — ABNORMAL LOW (ref 8.9–10.3)
Chloride: 99 mmol/L (ref 98–111)
Creatinine, Ser: 0.62 mg/dL (ref 0.61–1.24)
GFR, Estimated: >60 mL/min (ref >60)
Glucose, Bld: 131 mg/dL — ABNORMAL HIGH (ref 70–99)
Potassium: 3.8 mmol/L (ref 3.5–5.1)
Sodium: 134 mmol/L — ABNORMAL LOW (ref 135–145)

## 2022-10-07 LAB — CBC
HCT: 33.3 % — ABNORMAL LOW (ref 39.0–52.0)
Hemoglobin: 10.3 g/dL — ABNORMAL LOW (ref 13.0–17.0)
MCH: 25.7 pg — ABNORMAL LOW (ref 26.0–34.0)
MCHC: 30.9 g/dL (ref 30.0–36.0)
MCV: 83 fL (ref 80.0–100.0)
Platelets: 367 10*3/uL (ref 150–400)
RBC: 4.01 MIL/uL — ABNORMAL LOW (ref 4.22–5.81)
RDW: 14.4 % (ref 11.5–15.5)
WBC: 7.4 10*3/uL (ref 4.0–10.5)
nRBC: 0 % (ref 0.0–0.2)

## 2022-10-07 LAB — CULTURE, BLOOD (ROUTINE X 2)
Culture: NO GROWTH
Culture: NO GROWTH
Special Requests: ADEQUATE

## 2022-10-07 MED ORDER — AMOXICILLIN-POT CLAVULANATE 875-125 MG PO TABS
1.0000 | ORAL_TABLET | Freq: Two times a day (BID) | ORAL | Status: DC
  Filled 2022-10-07 (×2): qty 1

## 2022-10-07 MED ORDER — AMOXICILLIN-POT CLAVULANATE 875-125 MG PO TABS
1.0000 | ORAL_TABLET | Freq: Two times a day (BID) | ORAL | 0 refills | Status: AC
  Filled 2022-10-07: qty 14, 7d supply, fill #0

## 2022-10-07 NOTE — Plan of Care (Signed)
  Problem: Metabolic: Goal: Ability to maintain appropriate glucose levels will improve Outcome: Progressing   

## 2022-10-07 NOTE — Plan of Care (Signed)

## 2022-10-07 NOTE — Progress Notes (Addendum)
I  assisted Ronnald Collum  RN,TOC CM/SW Case Management with interpretation over the phone with pt and wife by Orlan Leavens Spanish Medical Interpreter.

## 2022-10-07 NOTE — TOC Initial Note (Signed)
Transition of Care [facility]) - Initial/Assessment Note    Patient Details  Name: [name] MRN: [id number] Date of Birth: 1965-06-03  [facility]) CM/SW Contact:    Leone Haven, RN Phone Number: 10/07/2022, 11:02 AM  Clinical Narrative:                 NCM spoke with patient and wife on the phone with interpreter.  Per wife he does not ambulate, he uses a wheelchair, and he has a motorized w/chair in the room.  He lives with her, he has PCP  and insurance on file.  He is currently active with [facility] with [facility] and would like to continue this.  NCM confirmed with Tresa Endo with [facility].  Wife will transport him home at dc, his family is his support system. He gets his medications [facility],   Expected Discharge Plan: Home w Home Health Services Barriers to Discharge: Continued Medical Work up   Patient Goals and CMS Choice Patient states their goals for this hospitalization and ongoing recovery are:: return home CMS Medicare.gov Compare Post Acute Care list provided to:: Patient Represenative (must comment) Choice offered to / list presented to : Spouse      Expected Discharge Plan and Services In-house Referral: NA Discharge Planning Services: CM Consult Post Acute Care Choice: Home Health, Resumption of Svcs/PTA Provider Living arrangements for the past 2 months: Single Family Home                 DME Arranged: N/A DME Agency: NA       HH Arranged: RN HH Agency: [facility] Date HH Agency Contacted: 10/07/22 Time HH Agency Contacted: 1100 Representative spoke with at [facility] Agency: Tresa Endo  Prior Living Arrangements/Services Living arrangements for the past 2 months: Single Family Home Lives with:: Spouse Patient language and need for interpreter reviewed:: Yes Do you feel safe going back to the place where you live?: Yes      Need for Family Participation in Patient Care: Yes (Comment) Care giver support system in place?:  Yes (comment) Current home services: DME, Home RN (motorized wheelchair, [facility]) Criminal Activity/Legal Involvement Pertinent to Current Situation/Hospitalization: No - Comment as needed  Activities of Daily Living Home Assistive Devices/Equipment: Wheelchair (power chair) ADL Screening (condition at time of admission) Patient's cognitive ability adequate to safely complete daily activities?: Yes Is the patient deaf or have difficulty hearing?: No Does the patient have difficulty seeing, even when wearing glasses/contacts?: No Does the patient have difficulty concentrating, remembering, or making decisions?: No Patient able to express need for assistance with ADLs?: Yes Does the patient have difficulty dressing or bathing?: Yes Independently performs ADLs?: No Communication: Independent Dressing (OT): Needs assistance Is this a change from baseline?: Pre-admission baseline Grooming: Needs assistance Is this a change from baseline?: Pre-admission baseline Feeding: Independent Bathing: Needs assistance Is this a change from baseline?: Pre-admission baseline Toileting: Needs assistance Is this a change from baseline?: Pre-admission baseline In/Out Bed: Dependent Is this a change from baseline?: Pre-admission baseline Walks in Home: Needs assistance Is this a change from baseline?: Pre-admission baseline Does the patient have difficulty walking or climbing stairs?: Yes Weakness of Legs: Both Weakness of Arms/Hands: None  Permission Sought/Granted Permission sought to share information with : Case Manager Permission granted to share information with : Yes, Verbal Permission Granted     Permission granted to share info w AGENCY: [facility]        Emotional Assessment Appearance:: Appears stated age  Attitude/Demeanor/Rapport: Engaged Affect (typically observed): Appropriate Orientation: : Oriented to Self, Oriented to Place, Oriented to  Time, Oriented to Situation Alcohol /  Substance Use: Not Applicable Psych Involvement: No (comment)  Admission diagnosis:  Chills [R68.83] UTI (urinary tract infection) [N39.0] Decreased appetite [R63.0] Wound of sacral region, initial encounter [S31.000A] Hypotension, unspecified hypotension type [I95.9] Dysphagia, unspecified type [R13.10] Acute cough [R05.1] Patient Active Problem List   Diagnosis Date Noted   Acute pyelonephritis 10/05/2022   UTI (urinary tract infection) 10/03/2022   Cough 10/03/2022   Anemia of chronic disease 10/03/2022   Paraplegia (HCC) 07/07/2022   Cellulitis, gluteal, left 07/06/2022   Essential hypertension 02/28/2022   Pressure injury of skin 08/07/2021   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)    Acute cystitis    Acute osteomyelitis (HCC) 08/06/2021   Chronic pain due to trauma 01/01/2021   DM2 (diabetes mellitus, type 2) (HCC) 04/26/2020   History of DVT (deep vein thrombosis) 04/26/2020   UTI due to extended-spectrum beta lactamase (ESBL) producing Escherichia coli 04/26/2020   At risk for unstable body temperature 11/26/2019   Wheelchair dependence 11/26/2019   Sepsis due to cellulitis (HCC) 09/22/2019   Hypokalemia 09/22/2019   Spasticity 03/08/2019   Erectile dysfunction due to diseases classified elsewhere 03/08/2019   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/22/2019   Neurogenic bowel 01/22/2019   Neurogenic bladder 01/22/2019   Myofascial pain 01/22/2019   Spinal cord injury at T1-T6 level (HCC) 12/30/2018   Quadriplegia (HCC) 12/30/2018   PCP:  Valerie Roys, FNP Pharmacy:   [facility] [place], [place] - [address] [place] [address] Phone: [phone] Fax: [phone]  [facility] [address] [place] [address] Phone: [phone] Fax: [phone]     Social Determinants of Health (SDOH) Social History: SDOH Screenings   Food Insecurity: Not on File (05/31/2021)   Received from  [place], [place]  Transportation Needs: Not on File (05/31/2021)   Received from [place], [place]  Depression [phone]): Low Risk  (09/06/2022)  Financial Resource Strain: Not on File (05/31/2021)   Received from [place], [place]  Physical Activity: Not on File (05/31/2021)   Received from [place], [place]  Social Connections: Not on File (05/31/2021)   Received from [place], [place]  Stress: Not on File (05/31/2021)   Received from [place], [place]  Tobacco Use: Low Risk  (10/03/2022)   SDOH Interventions:     Readmission Risk Interventions     No data to display

## 2022-10-07 NOTE — Care Management Important Message (Signed)
Important Message  Patient Details  Name: [name] MRN: [id number] Date of Birth: [birth year]   Medicare Important Message Given:  Yes     Renie Ora 10/07/2022, 8:15 AM

## 2022-10-07 NOTE — TOC Transition Note (Signed)
Transition of Care [facility]) - CM/SW Discharge Note   Patient Details  Name: [name] MRN: [id number] Date of Birth: [birth year]  [facility]) CM/SW Contact:  Leone Haven, RN Phone Number: 10/07/2022, 11:55 AM   Clinical Narrative:    Patient is for dc today, TOC to fill meds. Wife will transport patient, home at dc .       Barriers to Discharge: Continued Medical Work up   Patient Goals and CMS Choice CMS Medicare.gov Compare Post Acute Care list provided to:: Patient Represenative (must comment) Choice offered to / list presented to : Spouse  Discharge Placement                         Discharge Plan and Services Additional resources added to the After Visit Summary for   In-house Referral: NA Discharge Planning Services: CM Consult Post Acute Care Choice: Home Health, Resumption of Svcs/PTA Provider          DME Arranged: N/A DME Agency: NA       HH Arranged: RN HH Agency: [facility] Date HH Agency Contacted: 10/07/22 Time HH Agency Contacted: 1100 Representative spoke with at [facility] Agency: Tresa Endo  Social Determinants of Health (SDOH) Interventions SDOH Screenings   Food Insecurity: Not on File (05/31/2021)   Received from [place], [place]  Transportation Needs: Not on File (05/31/2021)   Received from [place], [place]  Depression (PHQ2-9): Low Risk  (09/06/2022)  Financial Resource Strain: Not on File (05/31/2021)   Received from [place], [place]  Physical Activity: Not on File (05/31/2021)   Received from [place], [place]  Social Connections: Not on File (05/31/2021)   Received from [place], [place]  Stress: Not on File (05/31/2021)   Received from [facility], [place]  Tobacco Use: Low Risk  (10/03/2022)     Readmission Risk Interventions     No data to display

## 2022-10-07 NOTE — Discharge Summary (Signed)
Physician Discharge Summary   Patient: [name] MRN: [id number] DOB: 1965/09/21  Admit date:     10/02/2022  Discharge date: 10/07/22  Discharge Physician: York Ram Claudia Greenley   PCP: Valerie Roys, FNP   Recommendations at discharge:    Plan to continue antibiotic therapy with Augmentin for 7 more days. Follow up with outpatient wound care. Home health services to follow up on chronic foley catheter.  Follow up with Valerie Roys FNP in 7 to 10 days.   Discharge Diagnoses: Principal Problem:   Acute pyelonephritis Active Problems:   Quadriplegia (HCC)   Essential hypertension   DM2 (diabetes mellitus, type 2) (HCC)   History of DVT (deep vein thrombosis)   Anemia of chronic disease   Pressure injury of skin  Resolved Problems:   * No resolved hospital problems. [facility] Course: Mr. Zeh was admitted to the hospital with the working diagnosis pyelonephritis/ catheter associated urinary tract infection (present on admission).   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  Per patient and family report, has had issues with Foley catheter being repeatedly clogged lately, changed 1 week PTA, noted progressive chills, coughing with oral liquid intake but no dyspnea. On his initial physical examination his blood pressure was 88/65, HR 70, RR 20 and 02 saturation 95% on room air. Lungs with no wheezing or rales, heart with S1 and S2 present and rhythmic, abdomen with no distention and no lower extremity edema.   Na 134, K 3,8 Cl 101, bicarbonate 21,  glucose 144 bun 8 cr 0,75  Wbc 10,1 hgb 11,4 plt 434  Sars covid 19 negative  Urine analysis SG 1,013, protein 30, wbc 11-20.   Chest radiograph with no cardiomegaly, no infiltrates and no effusions. Positive cervical spine hardware.   EKG 87 bpm, normal axis, normal intervals, sinus rhythm with no significant ST segment or T wave changes.   CT neck soft tissue with no acute changes.  CT head with no acute changes.  CT chest negative for pulmonary embolism. Question geographic hypoenhancement in the superior pole of the left kidney may be due pyelonephritis or artifact.  Periureteral stranding about the ureters as they cross the iliac vessels. Thick wall urinary bladder decompressed about Foley catheter.  Unchanged nephrolithiasis 2.1 cm on the left.  Similar decubitus sacral ulcer abutting the coccyx with findings of chronic osteomyelitis, no abscess.   Patient as placed on IV fluids and broad spectrum antibiotic therapy. Sepsis syndrome improved.  He has been transitioned to oral antibiotic therapy with instructions to follow up as outpatient.   Assessment and Plan: * Acute pyelonephritis Urine infection associated to chronic indwelling foley catheter present on admission.  Severe sepsis (present on admission) end organ failure hypotension.   10/02/22 urine culture with multiple species.  07/05/22  E Choli and Proteus mirabilis, (100,000 CFU each). E coli resistant to cephalosporins and Proteus resistant to bactrim.   CT with hypo-enhancement in the superior pole of the left kidney, pyelonephritis or artifact. Periureteral stranding about the ureters as they cross the iliac vessels. Thick walled urinary bladder decompressed about a Foley catheter.   Patient was treated  with ceftriaxone with good toleration, he has been afebrile and no leukocytosis.  Blood cultures have been negative.   Based on prior cultures, will continue antibiotic therapy with Augmentin for 7 more days.   Continue with Foley care.   Quadriplegia [facility]) Patient with limited mobility at home. Has a Art gallery manager.  Continue pain control with gabapentin, hydrocodone and baclofen.    Obesity class 3 with a calculated BMI of 37.   Essential hypertension Blood pressure has been controlled.  Hold lisinopril to avoid hypotension.   DM2 (diabetes mellitus, type 2) (HCC) Patient was placed on insulin therapy during his hospitalization, sliding scale and basal with good toleration.  At discharge will resume home regimen with glipizide and basal insulin.   History of DVT (deep vein thrombosis) Continue anticoagulation with apixaban. Had history of IVC filter.   Anemia of chronic disease Hgb has been stable.   Pressure injury of skin Continue local wound care.  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  Wound care.         Consultants: none  Procedures performed: none   Disposition: Home Diet recommendation:  Discharge Diet Orders (From admission, onward)     Start     Ordered   10/07/22 0000  Diet - low sodium heart healthy        10/07/22 1130           Cardiac and Carb modified diet DISCHARGE MEDICATION: Allergies as of 10/07/2022   No Known Allergies      Medication List     STOP taking these medications    lisinopril 10 MG tablet Commonly known as: ZESTRIL       TAKE these medications    acetaminophen 500 MG tablet Commonly known as: TYLENOL Take 500 mg by mouth every 6 (six) hours as needed for mild pain.   amoxicillin-clavulanate 875-125 MG tablet Commonly known as: AUGMENTIN Take 1 tablet by mouth every 12 (twelve) hours for 7 days.   ARTIFICIAL TEARS OP Apply 1-2 drops to eye daily as needed (dry eyes).   baclofen 10 MG tablet Commonly known as: LIORESAL TAKE 1 TABLET BY MOUTH 3 TIMES DAILY AS NEEDED FOR MUSCLE SPASMS OR tightness What changed:  how much to take how to take this when to take this reasons to take  this additional instructions   BISACODYL LAXATIVE RE Place 1 suppository rectally at bedtime.   docusate sodium 100 MG capsule Commonly known as: COLACE Take 1 capsule (100 mg total) by mouth 2 (two) times daily for 5 days, then as directed by physician   Eliquis 5 MG Tabs tablet Generic drug: apixaban Take 1 tablet by mouth 2 (two) times daily.   FLUoxetine 20 MG capsule Commonly known as: PROZAC Take 1 capsule (20 mg total) by mouth daily. What changed: when to take this   furosemide 40 MG tablet Commonly known as: LASIX Take 1 tablet daily for 6 days, take as needed after that for swelling or weight gain   gabapentin 600 MG tablet Commonly known as: NEURONTIN TAKE 1 TABLET BY MOUTH 3 TIMES DAILY   glipiZIDE 10 MG tablet Commonly known as: GLUCOTROL Take 1 tablet by mouth 2 (two) times daily.   HYDROcodone-acetaminophen 5-325 MG tablet Commonly known as: Norco Take 1 tablet by mouth 2 (two) times daily as needed for moderate pain.   Lantus 100 UNIT/ML injection Generic drug: insulin glargine Inject 30 Units into the skin at bedtime.   ondansetron 4 MG tablet  Commonly known as: ZOFRAN Take 1 tablet (4 mg total) by mouth every 6 (six) hours as needed for nausea.               Discharge Care Instructions  (From admission, onward)           Start     Ordered   10/07/22 0000  Discharge wound care:       Comments: Apply moist gauze packing to sacrum wound Q day, using swab to fill, then cover with foam dressing.  Change foam dressing Q 3 days or PRN soiling   10/07/22 1130            Follow-up Information     [facility] Follow up.   Specialty: Home Health Services Why: Agency will call you to set up apt times Contact information: [address] [place] [address] [phone]                Discharge Exam: Ceasar Mons Weights   10/05/22 0510 10/06/22 0451 10/06/22 1535  Weight: 107.3 kg 107.2 kg 108.1 kg   BP  94/63 (BP Location: Right Arm)   Pulse 84   Temp 97.6 F (36.4 C) (Oral)   Resp 20   Ht 5\' 7"  (1.702 m)   Wt 108.1 kg   SpO2 98%   BMI 37.33 kg/m   Patient with no chest pain or dyspnea, no chills or abdominal pain  Neurology awake and alert ENT with no pallor Cardiovascular with S1 and S2 present and regular with no gallops or rubs Respiratory with no rales or wheezing, no rhonchi Abdomen with no distention  No lower extremity edema   Condition at discharge: stable  The results of significant diagnostics from this hospitalization (including imaging, microbiology, ancillary and laboratory) are listed below for reference.   Imaging Studies: CT Angio Chest PE W and/or Wo Contrast  Result Date: 10/03/2022 CLINICAL DATA:  Intra-abdominal abscess. History of paraplegia. Sacral wound. Evaluate for signs of deeper abscess/source of infectious symptoms. Pulmonary embolism suspected. EXAM: CT ANGIOGRAPHY CHEST CT ABDOMEN AND PELVIS WITH CONTRAST TECHNIQUE: Multidetector CT imaging of the chest was performed using the standard protocol during bolus administration of intravenous contrast. Multiplanar CT image reconstructions and MIPs were obtained to evaluate the vascular anatomy. Multidetector CT imaging of the abdomen and pelvis was performed using the standard protocol during bolus administration of intravenous contrast. RADIATION DOSE REDUCTION: This exam was performed according to the departmental dose-optimization program which includes automated exposure control, adjustment of the mA and/or kV according to patient size and/or use of iterative reconstruction technique. CONTRAST:  OMNIPAQUE IOHEXOL 350 MG/ML SOLN COMPARISON:  CT abdomen and pelvis 07/05/2022 and same day chest radiograph FINDINGS: CTA CHEST FINDINGS Cardiovascular: Negative for acute pulmonary embolism. No pericardial effusion. No aortic aneurysm or dissection. Mild aortic atherosclerotic calcification. Mediastinum/Nodes:  Trachea and esophagus are unremarkable. No mediastinal or hilar adenopathy. Lungs/Pleura: No focal consolidation, pleural effusion, or pneumothorax. Musculoskeletal: No acute fracture. Partially visualized cervicothoracic spine fusion. Review of the MIP images confirms the above findings. CT ABDOMEN and PELVIS FINDINGS Hepatobiliary: Unremarkable. Pancreas: Unremarkable. Spleen: Unremarkable. Adrenals/Urinary Tract: Stable adrenal glands. Redemonstrated left nephrolithiasis with stone in the left renal pelvis measuring 2.1 cm. Question geographic hypoenhancement in the superior pole of the left kidney versus artifact. No hydronephrosis or obstructing calculi. Periureteral stranding about the ureters as they cross the iliac vessels. Foley catheter in the nondistended thick-walled bladder. Stomach/Bowel: Normal caliber large and small bowel.  No bowel wall thickening. Normal appendix. Stomach is within normal limits. Vascular/Lymphatic: IVC filter. Mild aortic atherosclerotic calcification. Bilateral iliac chain nodes are similar to decreased from prior and likely reactive. Reproductive: No acute abnormality. Other: Soft tissue thickening and fluid within the presacral space. No free intraperitoneal air. Musculoskeletal: No acute fracture. Similar appearance of the sacral decubitus ulcer abutting the coccyx. Soft tissue thickening along the gluteal cleft without abscess. No soft tissue gas. Sclerosis of the spinous processes of the sacrum and resorption of the coccyx compatible with chronic osteomyelitis. Review of the MIP images confirms the above findings. IMPRESSION: 1. Negative for acute pulmonary embolism. 2. Question geographic hypoenhancement in the superior pole of the left kidney may be due to pyelonephritis versus artifact. 3. Periureteral stranding about the ureters as they cross the iliac vessels. Thick-walled urinary bladder decompressed about a Foley catheter. Correlate with urinalysis for cystitis and  ascending urinary tract infection. 4. Unchanged left nephrolithiasis measuring up to 2.1 cm. 5. Similar sacral decubitus ulcer abutting the coccyx with findings of chronic osteomyelitis. No abscess. Aortic Atherosclerosis (ICD10-I70.0). Electronically Signed   By: Minerva Fester M.D.   On: 10/03/2022 00:43   CT ABDOMEN PELVIS W CONTRAST  Result Date: 10/03/2022 CLINICAL DATA:  Intra-abdominal abscess. History of paraplegia. Sacral wound. Evaluate for signs of deeper abscess/source of infectious symptoms. Pulmonary embolism suspected. EXAM: CT ANGIOGRAPHY CHEST CT ABDOMEN AND PELVIS WITH CONTRAST TECHNIQUE: Multidetector CT imaging of the chest was performed using the standard protocol during bolus administration of intravenous contrast. Multiplanar CT image reconstructions and MIPs were obtained to evaluate the vascular anatomy. Multidetector CT imaging of the abdomen and pelvis was performed using the standard protocol during bolus administration of intravenous contrast. RADIATION DOSE REDUCTION: This exam was performed according to the departmental dose-optimization program which includes automated exposure control, adjustment of the mA and/or kV according to patient size and/or use of iterative reconstruction technique. CONTRAST:  OMNIPAQUE IOHEXOL 350 MG/ML SOLN COMPARISON:  CT abdomen and pelvis 07/05/2022 and same day chest radiograph FINDINGS: CTA CHEST FINDINGS Cardiovascular: Negative for acute pulmonary embolism. No pericardial effusion. No aortic aneurysm or dissection. Mild aortic atherosclerotic calcification. Mediastinum/Nodes: Trachea and esophagus are unremarkable. No mediastinal or hilar adenopathy. Lungs/Pleura: No focal consolidation, pleural effusion, or pneumothorax. Musculoskeletal: No acute fracture. Partially visualized cervicothoracic spine fusion. Review of the MIP images confirms the above findings. CT ABDOMEN and PELVIS FINDINGS Hepatobiliary: Unremarkable. Pancreas:  Unremarkable. Spleen: Unremarkable. Adrenals/Urinary Tract: Stable adrenal glands. Redemonstrated left nephrolithiasis with stone in the left renal pelvis measuring 2.1 cm. Question geographic hypoenhancement in the superior pole of the left kidney versus artifact. No hydronephrosis or obstructing calculi. Periureteral stranding about the ureters as they cross the iliac vessels. Foley catheter in the nondistended thick-walled bladder. Stomach/Bowel: Normal caliber large and small bowel. No bowel wall thickening. Normal appendix. Stomach is within normal limits. Vascular/Lymphatic: IVC filter. Mild aortic atherosclerotic calcification. Bilateral iliac chain nodes are similar to decreased from prior and likely reactive. Reproductive: No acute abnormality. Other: Soft tissue thickening and fluid within the presacral space. No free intraperitoneal air. Musculoskeletal: No acute fracture. Similar appearance of the sacral decubitus ulcer abutting the coccyx. Soft tissue thickening along the gluteal cleft without abscess. No soft tissue gas. Sclerosis of the spinous processes of the sacrum and resorption of the coccyx compatible with chronic osteomyelitis. Review of the MIP images confirms the above findings. IMPRESSION: 1. Negative for acute pulmonary embolism. 2. Question geographic hypoenhancement in the superior pole of the  left kidney may be due to pyelonephritis versus artifact. 3. Periureteral stranding about the ureters as they cross the iliac vessels. Thick-walled urinary bladder decompressed about a Foley catheter. Correlate with urinalysis for cystitis and ascending urinary tract infection. 4. Unchanged left nephrolithiasis measuring up to 2.1 cm. 5. Similar sacral decubitus ulcer abutting the coccyx with findings of chronic osteomyelitis. No abscess. Aortic Atherosclerosis (ICD10-I70.0). Electronically Signed   By: Minerva Fester M.D.   On: 10/03/2022 00:43   CT Soft Tissue Neck W Contrast  Result Date:  10/03/2022 CLINICAL DATA:  A soft tissue infection suspected EXAM: CT NECK WITH CONTRAST TECHNIQUE: Multidetector CT imaging of the neck was performed using the standard protocol following the bolus administration of intravenous contrast. RADIATION DOSE REDUCTION: This exam was performed according to the departmental dose-optimization program which includes automated exposure control, adjustment of the mA and/or kV according to patient size and/or use of iterative reconstruction technique. CONTRAST:  OMNIPAQUE IOHEXOL 350 MG/ML SOLN COMPARISON:  None Available. FINDINGS: Evaluation is somewhat limited by beam hardening from the patient's spinal hardware. Pharynx and larynx: Normal. No mass or swelling. Salivary glands: No inflammation, mass, or stone. Thyroid: Normal. Lymph nodes: None enlarged or abnormal density. Vascular: Patent. Limited intracranial: Negative. Visualized orbits: Negative. Mastoids and visualized paranasal sinuses: Mild mucosal thickening in the ethmoid air cells and inferior right frontal sinus. The mastoids are well aerated. Skeleton: No acute fracture or suspicious osseous lesion. Status post posterior fusion from C4 through the imaged thoracic spine and off the inferior field of view. Upper chest: Please see same-day CT chest. Other: None. IMPRESSION: No acute process in the neck. Electronically Signed   By: Wiliam Ke M.D.   On: 10/03/2022 00:25   CT Head Wo Contrast  Result Date: 10/03/2022 CLINICAL DATA:  Headache, new onset EXAM: CT HEAD WITHOUT CONTRAST TECHNIQUE: Contiguous axial images were obtained from the base of the skull through the vertex without intravenous contrast. RADIATION DOSE REDUCTION: This exam was performed according to the departmental dose-optimization program which includes automated exposure control, adjustment of the mA and/or kV according to patient size and/or use of iterative reconstruction technique. COMPARISON:  04/26/2020 FINDINGS: Brain: No  evidence of acute infarction, hemorrhage, mass, mass effect, or midline shift. No hydrocephalus or extra-axial fluid collection. Vascular: No hyperdense vessel. Skull: Negative for fracture or focal lesion. Sinuses/Orbits: Mild mucosal thickening in the right anterior ethmoid air cells and inferior right frontal sinus. No acute finding in the orbits. Other: The mastoid air cells are well aerated. IMPRESSION: No acute intracranial process. Electronically Signed   By: Wiliam Ke M.D.   On: 10/03/2022 00:21   DG Chest 2 View  Result Date: 10/02/2022 CLINICAL DATA:  Suspected Sepsis.  Weakness. EXAM: CHEST - 2 VIEW COMPARISON:  07/05/2022. FINDINGS: Bilateral lung fields are clear. Bilateral costophrenic angles are clear. Normal cardio-mediastinal silhouette. No acute osseous abnormalities. Partially seen cervicothoracic spinal fixation hardware. The soft tissues are within normal limits. IMPRESSION: No active cardiopulmonary disease. Electronically Signed   By: Jules Schick M.D.   On: 10/02/2022 19:43    Microbiology: Results for orders placed or performed during the hospital encounter of 10/02/22  Culture, blood (Routine x 2)     Status: None (Preliminary result)   Collection Time: 10/02/22  6:11 PM   Specimen: BLOOD RIGHT HAND  Result Value Ref Range Status   Specimen Description BLOOD RIGHT HAND  Final   Special Requests   Final    BOTTLES DRAWN AEROBIC  AND ANAEROBIC Blood Culture adequate volume   Culture   Final    NO GROWTH 4 DAYS Performed at [facility], [address] [place], [place] [address]    Report Status PENDING  Incomplete  Culture, blood (Routine x 2)     Status: None (Preliminary result)   Collection Time: 10/02/22  7:46 PM   Specimen: BLOOD RIGHT ARM  Result Value Ref Range Status   Specimen Description BLOOD RIGHT ARM  Final   Special Requests   Final    BOTTLES DRAWN AEROBIC AND ANAEROBIC Blood Culture results may not be optimal due to an inadequate volume of  blood received in culture bottles   Culture   Final    NO GROWTH 4 DAYS Performed at [facility], [address] [place], [place] [address]    Report Status PENDING  Incomplete  Urine Culture     Status: Abnormal   Collection Time: 10/02/22  8:54 PM   Specimen: Urine, Random  Result Value Ref Range Status   Specimen Description URINE, RANDOM  Final   Special Requests   Final    NONE Reflexed from [id number] Performed at [facility], [address] [place], [place] [address]    Culture MULTIPLE SPECIES PRESENT, SUGGEST RECOLLECTION (A)  Final   Report Status 10/03/2022 FINAL  Final  SARS Coronavirus 2 by RT PCR (hospital order, performed in [facility] hospital lab) *cepheid single result test* Anterior Nasal Swab     Status: None   Collection Time: 10/02/22 10:24 PM   Specimen: Anterior Nasal Swab  Result Value Ref Range Status   SARS Coronavirus 2 by RT PCR NEGATIVE NEGATIVE Final    Comment: Performed at [facility], [address], [place], [place] [address]    Labs: CBC: Recent Labs  Lab 10/02/22 1811 10/03/22 0419 10/05/22 0433 10/06/22 0317 10/07/22 0350  WBC 10.1 9.0 6.3 7.6 7.4  NEUTROABS 6.9 6.5  --   --   --   HGB 11.4* 10.6* 9.9* 10.3* 10.3*  HCT 37.2* 33.7* 32.1* 32.1* 33.3*  MCV 83.4 84.7 83.6 84.0 83.0  PLT 434* 379 346 364 367   Basic Metabolic Panel: Recent Labs  Lab 10/02/22 1811 10/03/22 0419 10/06/22 0317 10/07/22 0350  NA 134* 133* 133* 134*  K 3.8 3.8 3.6 3.8  CL 101 101 101 99  CO2 21* 23 23 24   GLUCOSE 144* 161* 113* 131*  BUN 8 7 8 11   CREATININE 0.75 0.72 0.65 0.62  CALCIUM 8.9 8.6* 8.6* 8.6*  MG  --  1.9  --   --    Liver Function Tests: Recent Labs  Lab 10/02/22 1811 10/03/22 0419  AST 21 17  ALT 19 17  ALKPHOS 61 56  BILITOT 0.5 0.5  PROT 8.6* 7.7  ALBUMIN 2.9* 2.5*   CBG: Recent Labs  Lab 10/06/22 1125 10/06/22 1532 10/06/22 2120 10/07/22 0055 10/07/22 0556  GLUCAP 141* 133* 196* 138* 134*     Discharge time spent: greater than 30 minutes.  Signed: Coralie Keens, MD [facility] 10/07/2022

## 2022-10-08 ENCOUNTER — Encounter (HOSPITAL_BASED_OUTPATIENT_CLINIC_OR_DEPARTMENT_OTHER): Payer: 59 | Admitting: Internal Medicine

## 2022-10-08 DIAGNOSIS — Z794 Long term (current) use of insulin: Secondary | ICD-10-CM | POA: Diagnosis not present

## 2022-10-08 DIAGNOSIS — L89154 Pressure ulcer of sacral region, stage 4: Secondary | ICD-10-CM

## 2022-10-08 DIAGNOSIS — E11622 Type 2 diabetes mellitus with other skin ulcer: Secondary | ICD-10-CM | POA: Diagnosis not present

## 2022-10-08 DIAGNOSIS — M4628 Osteomyelitis of vertebra, sacral and sacrococcygeal region: Secondary | ICD-10-CM | POA: Diagnosis not present

## 2022-10-08 DIAGNOSIS — G8221 Paraplegia, complete: Secondary | ICD-10-CM | POA: Diagnosis not present

## 2022-10-08 DIAGNOSIS — E1169 Type 2 diabetes mellitus with other specified complication: Secondary | ICD-10-CM | POA: Diagnosis not present

## 2022-10-08 DIAGNOSIS — S31501A Unspecified open wound of unspecified external genital organs, male, initial encounter: Secondary | ICD-10-CM | POA: Diagnosis not present

## 2022-10-08 DIAGNOSIS — X58XXXA Exposure to other specified factors, initial encounter: Secondary | ICD-10-CM | POA: Diagnosis not present

## 2022-10-09 NOTE — Progress Notes (Signed)
NEELESH, NISHIKAWA ([id number]) [id number] Page 1 of 11 Visit Report for 10/08/2022 Arrival Information Details Patient Name: Date of Service: [name] 10/08/2022 12:45 PM Medical Record Number: [id number] Patient Account Number: [id number] Date of Birth/Sex: Treating RN: 1965-08-07 (57 y.o. [name] Primary Care Kaikoa Magro: Gayleen Orem, Zena Amos Other Clinician: Referring Doneen Ollinger: Treating Decari Duggar/Extender: Geralyn Corwin BRO WN, [name] Weeks in Treatment: 10 Visit Information History Since Last Visit All ordered tests and consults were completed: No Patient Arrived: Wheel Chair Added or deleted any medications: Yes Arrival Time: 12:45 Any new allergies or adverse reactions: No Accompanied By: wife and interpreter Had a fall or experienced change in No Transfer Assistance: Nurse, adult activities of daily living that may affect Patient Identification Verified: Yes risk of falls: Secondary Verification Process Completed: Yes Signs or symptoms of abuse/neglect since last visito No Patient Requires Transmission-Based Precautions: No Hospitalized since last visit: Yes Patient Has Alerts: No Implantable device outside of the clinic excluding No cellular tissue based products placed in the center since last visit: Has Dressing in Place as Prescribed: Yes Pain Present Now: No Electronic Signature(s) Signed: 10/08/2022 5:33:37 PM By: Shawn Stall RN, BSN Entered By: Shawn Stall on 10/08/2022 13:04:06 -------------------------------------------------------------------------------- Clinic Level of Care Assessment Details Patient Name: Date of Service: [name] 10/08/2022 12:45 PM Medical Record Number: [id number] Patient Account Number: [id number] Date of Birth/Sex: Treating RN: Dec 04, 1965 (57 y.o. [name], Millard.Loa Primary Care Lekeya Rollings: Gayleen Orem, Zena Amos Other Clinician: Referring Jacey Eckerson: Treating Adalai Perl/Extender: Geralyn Corwin BRO WN, [name] Weeks in Treatment: 10 Clinic Level of Care Assessment Items TOOL 4 Quantity Score X- 1 0 Use when only an EandM is performed on FOLLOW-UP visit ASSESSMENTS - Nursing Assessment / Reassessment X- 1 10 Reassessment of Co-morbidities (includes updates in patient status) X- 1 5 Reassessment of Adherence to Treatment Plan ASSESSMENTS - Wound and Skin A ssessment / Reassessment []  - 0 Simple Wound Assessment / Reassessment - one wound X- 2 5 Complex Wound Assessment / Reassessment - multiple wounds X- 1 10 Dermatologic / Skin Assessment (not related to wound area) ASSESSMENTS - Focused Assessment []  - 0 Circumferential Edema Measurements - multi extremities []  - 0 Nutritional Assessment / Counseling / Intervention KOFI, PRINKEY ([id number]) [id number] Page 2 of 11 []  - 0 Lower Extremity Assessment (monofilament, tuning fork, pulses) []  - 0 Peripheral Arterial Disease Assessment (using hand held doppler) ASSESSMENTS - Ostomy and/or Continence Assessment and Care []  - 0 Incontinence Assessment and Management []  - 0 Ostomy Care Assessment and Management (repouching, etc.) PROCESS - Coordination of Care []  - 0 Simple Patient / Family Education for ongoing care X- 1 20 Complex (extensive) Patient / Family Education for ongoing care X- 1 10 Staff obtains Chiropractor, Records, T Results / Process Orders est X- 1 10 Staff telephones HHA, Nursing Homes / Clarify orders / etc []  - 0 Routine Transfer to another Facility (non-emergent condition) []  - 0 Routine Hospital Admission (non-emergent condition) []  - 0 New Admissions / Manufacturing engineer / Ordering NPWT Apligraf, etc. , []  - 0 Emergency Hospital Admission (emergent condition) []  - 0 Simple Discharge Coordination X- 1 15 Complex (extensive) Discharge Coordination PROCESS - Special Needs []  - 0 Pediatric / Minor Patient Management []  - 0 Isolation Patient  Management []  - 0 Hearing / Language / Visual special needs []  - 0 Assessment of Community assistance (transportation, D/C planning, etc.) []  - 0 Additional assistance / Altered mentation X- 1 15 Support Surface(s) Assessment (bed,  cushion, seat, etc.) INTERVENTIONS - Wound Cleansing / Measurement []  - 0 Simple Wound Cleansing - one wound X- 2 5 Complex Wound Cleansing - multiple wounds X- 1 5 Wound Imaging (photographs - any number of wounds) []  - 0 Wound Tracing (instead of photographs) []  - 0 Simple Wound Measurement - one wound X- 2 5 Complex Wound Measurement - multiple wounds INTERVENTIONS - Wound Dressings X - Small Wound Dressing one or multiple wounds 1 10 X- 1 15 Medium Wound Dressing one or multiple wounds []  - 0 Large Wound Dressing one or multiple wounds X- 1 5 Application of Medications - topical []  - 0 Application of Medications - injection INTERVENTIONS - Miscellaneous []  - 0 External ear exam []  - 0 Specimen Collection (cultures, biopsies, blood, body fluids, etc.) []  - 0 Specimen(s) / Culture(s) sent or taken to Lab for analysis X- 1 10 Patient Transfer (multiple staff / Nurse, adult / Similar devices) []  - 0 Simple Staple / Suture removal (25 or less) []  - 0 Complex Staple / Suture removal (26 or more) []  - 0 Hypo / Hyperglycemic Management (close monitor of Blood Glucose) BREAKER, CAPITO ([id number]) [id number] Page 3 of 11 []  - 0 Ankle / Brachial Index (ABI) - do not check if billed separately X- 1 5 Vital Signs Has the patient been seen at the hospital within the last three years: Yes Total Score: 175 Level Of Care: New/Established - Level 5 Electronic Signature(s) Signed: 10/08/2022 5:33:37 PM By: Shawn Stall RN, BSN Entered By: Shawn Stall on 10/08/2022 13:48:01 -------------------------------------------------------------------------------- Encounter Discharge Information Details Patient Name: Date of  Service: [name], [name] 10/08/2022 12:45 PM Medical Record Number: [id number] Patient Account Number: [id number] Date of Birth/Sex: Treating RN: [birth year] (57 y.o. [name] Primary Care Mccrae Speciale: Kalman Shan Other Clinician: Referring Lauris Keepers: Treating Reghan Thul/Extender: Geralyn Corwin BRO WN, [name] Weeks in Treatment: 10 Encounter Discharge Information Items Discharge Condition: Stable Ambulatory Status: Wheelchair Discharge Destination: Home Transportation: Private Auto Accompanied By: wife Schedule Follow-up Appointment: Yes Clinical Summary of Care: Electronic Signature(s) Signed: 10/08/2022 5:33:37 PM By: Shawn Stall RN, BSN Entered By: Shawn Stall on 10/08/2022 13:22:04 -------------------------------------------------------------------------------- Lower Extremity Assessment Details Patient Name: Date of Service: [name] 10/08/2022 12:45 PM Medical Record Number: [id number] Patient Account Number: [id number] Date of Birth/Sex: Treating RN: 1965/11/19 (57 y.o. [name] Primary Care Valla Pacey: Gayleen Orem, Zena Amos Other Clinician: Referring Dilana Mcphie: Treating Rafe Mackowski/Extender: Geralyn Corwin BRO WN, [name] Weeks in Treatment: 10 Electronic Signature(s) Signed: 10/08/2022 5:33:37 PM By: Shawn Stall RN, BSN Entered By: Shawn Stall on 10/08/2022 13:04:34 -------------------------------------------------------------------------------- Multi Wound Chart Details Patient Name: Date of Service: [name], [name] 10/08/2022 12:45 PM [name] ([id number]) [id number] Page 4 of 11 Medical Record Number: [id number] Patient Account Number: [id number] Date of Birth/Sex: Treating RN: [birth year] (57 y.o. M) Primary Care Pang Robers: Kalman Shan Other Clinician: Referring Caio Devera: Treating Jeanmarie Mccowen/Extender: Geralyn Corwin BRO WN, [name] Weeks in Treatment: 10 Vital Signs Height(in): Pulse(bpm):  92 Weight(lbs): Blood Pressure(mmHg): 101/65 Body Mass Index(BMI): Temperature(F): 98.6 Respiratory Rate(breaths/min): 20 [7:Photos:] [N/A:N/A] Sacrum Groin N/A Wound Location: Pressure Injury Bump N/A Wounding Event: Pressure Ulcer T be determined o N/A Primary Etiology: Hypotension, Type II Diabetes, Hypotension, Type II Diabetes, N/A Comorbid History: Osteomyelitis, Paraplegia Osteomyelitis, Paraplegia 07/12/2021 10/08/2022 N/A Date Acquired: 10 0 N/A Weeks of Treatment: Open Open N/A Wound Status: No No N/A Wound Recurrence: 5.8x2x4.5 1x0.2x0.7 N/A Measurements L x W x D (cm) 9.111 0.157 N/A A (cm) :  rea 40.998 0.11 N/A Volume (cm) : 87.00% N/A N/A % Reduction in A rea: 82.30% N/A N/A % Reduction in Volume: 9 Position 1 (o'clock): 5.5 Maximum Distance 1 (cm): 7 Starting Position 1 (o'clock): 1 Ending Position 1 (o'clock): 3.2 Maximum Distance 1 (cm): Yes No N/A Tunneling: Yes No N/A Undermining: Category/Stage IV Full Thickness Without Exposed N/A Classification: Support Structures Large Medium N/A Exudate Amount: Serosanguineous Serosanguineous N/A Exudate Type: red, brown red, brown N/A Exudate Color: Distinct, outline attached Epibole N/A Wound Margin: Medium (34-66%) Large (67-100%) N/A Granulation Amount: N/A Pink N/A Granulation Quality: Medium (34-66%) None Present (0%) N/A Necrotic Amount: Fat Layer (Subcutaneous Tissue): Yes Fat Layer (Subcutaneous Tissue): Yes N/A Exposed Structures: Muscle: Yes Fascia: No Bone: Yes Tendon: No Fascia: No Muscle: No Tendon: No Joint: No Joint: No Bone: No None None N/A Epithelialization: Excoriation: No Excoriation: No N/A Periwound Skin Texture: Induration: No Induration: No Callus: No Callus: No Crepitus: No Crepitus: No Rash: No Rash: No Scarring: No Scarring: No Maceration: No Maceration: No N/A Periwound Skin Moisture: Dry/Scaly: No Dry/Scaly: No Atrophie Blanche:  No Atrophie Blanche: No N/A Periwound Skin Color: Cyanosis: No Cyanosis: No Ecchymosis: No Ecchymosis: No Erythema: No Erythema: No Hemosiderin Staining: No Hemosiderin Staining: No Mottled: No Mottled: No Pallor: No Pallor: No Rubor: No Rubor: No No Abnormality N/A N/A Temperature: Treatment Notes ASWAD, MCMANUS ([id number]) [id number] Page 5 of 11 Wound #7 (Sacrum) Cleanser Wound Cleanser Discharge Instruction: Cleanse the wound with wound cleanser prior to applying a clean dressing using gauze sponges, not tissue or cotton balls. Peri-Wound Care Skin Prep Discharge Instruction: Use skin prep as directed Topical Primary Dressing Dakin's Solution 0.25%, 16 (oz) Discharge Instruction: Moisten gauze with Dakin's solution Secondary Dressing ABD Pad, 8x10 Discharge Instruction: Apply over primary dressing as directed. Secured With 83M Medipore H Soft Cloth Surgical T ape, 4 x 10 (in/yd) Discharge Instruction: Secure with tape as directed. Compression Wrap Compression Stockings Add-Ons Wound #8 (Groin) Cleanser Vashe 5.8 (oz) Discharge Instruction: Cleanse the wound with Vashe prior to applying a clean dressing using gauze sponges, not tissue or cotton balls. Wound Cleanser Discharge Instruction: Cleanse the wound with wound cleanser prior to applying a clean dressing using gauze sponges, not tissue or cotton balls. Peri-Wound Care Skin Prep Discharge Instruction: Use skin prep as directed Topical Mupirocin Ointment Discharge Instruction: Apply Mupirocin (Bactroban) as instructed Primary Dressing Secondary Dressing Zetuvit Plus Silicone Border Dressing 4x4 (in/in) Discharge Instruction: Apply silicone border over primary dressing as directed. Secured With Compression Wrap Compression Stockings Add-Ons Electronic Signature(s) Signed: 10/08/2022 4:43:58 PM By: Geralyn Corwin DO Entered By: Geralyn Corwin on 10/08/2022  13:23:17 -------------------------------------------------------------------------------- Multi-Disciplinary Care Plan Details Patient Name: Date of Service: [name], [name] 10/08/2022 12:45 PM [name] ([id number]) [id number] Page 6 of 11 Medical Record Number: [id number] Patient Account Number: [id number] Date of Birth/Sex: Treating RN: 12-18-1965 (57 y.o. [name] Primary Care Takyia Sindt: Gayleen Orem, Zena Amos Other Clinician: Referring Contrina Orona: Treating Grayer Sproles/Extender: Geralyn Corwin BRO WN, [name] Weeks in Treatment: 10 Active Inactive Pressure Nursing Diagnoses: Knowledge deficit related to causes and risk factors for pressure ulcer development Knowledge deficit related to management of pressures ulcers Goals: Patient will remain free from development of additional pressure ulcers Date Initiated: 07/25/2022 Target Resolution Date: 11/01/2022 Goal Status: Active Patient will remain free of pressure ulcers Date Initiated: 07/25/2022 Target Resolution Date: 11/01/2022 Goal Status: Active Patient/caregiver will verbalize risk factors for pressure ulcer development Date Initiated: 07/25/2022 Target Resolution Date: 11/01/2022 Goal Status:  Active Patient/caregiver will verbalize understanding of pressure ulcer management Date Initiated: 07/25/2022 Target Resolution Date: 11/01/2022 Goal Status: Active Interventions: Assess: immobility, friction, shearing, incontinence upon admission and as needed Assess offloading mechanisms upon admission and as needed Assess potential for pressure ulcer upon admission and as needed Provide education on pressure ulcers Notes: Wound/Skin Impairment Nursing Diagnoses: Impaired tissue integrity Knowledge deficit related to ulceration/compromised skin integrity Goals: Patient will have a decrease in wound volume by X% from date: (specify in notes) Date Initiated: 07/25/2022 Target Resolution Date:  12/05/2022 Goal Status: Active Patient/caregiver will verbalize understanding of skin care regimen Date Initiated: 07/25/2022 Target Resolution Date: 11/07/2022 Goal Status: Active Ulcer/skin breakdown will have a volume reduction of 30% by week 4 Date Initiated: 07/25/2022 Date Inactivated: 08/23/2022 Target Resolution Date: 08/22/2022 Goal Status: Unmet Unmet Reason: remains the same. Interventions: Assess patient/caregiver ability to obtain necessary supplies Assess patient/caregiver ability to perform ulcer/skin care regimen upon admission and as needed Assess ulceration(s) every visit Provide education on ulcer and skin care Notes: Negative Pressure Wound Therapy Nursing Diagnoses: Knowledge deficit related to use and safety of the device Goals: Patient/caregiver agrees to and verbalizes understanding of need to use Date Initiated: 09/02/2022 Date Inactivated: 09/26/2022 Target Resolution Date: 09/26/2022 Goal Status: Met Patient/caregiver will verbalize understanding of use of the device Date Initiated: 09/02/2022 Date Inactivated: 09/26/2022 Target Resolution Date: 09/26/2022 Goal Status: Met Nutritional supplements and/or vitamins as prescribed TEANDRE, MCCRACKEN ([id number]) [phone].pdf Page 7 of 11 Date Initiated: 09/02/2022 Target Resolution Date: 11/08/2022 Goal Status: Active Interventions: Assess patient nutrition upon admission and as needed per policy Assess patient understanding of disease process and management Monitor and protect skin around the wound Provide education on nutrition Provide education on use, care, and troubleshooting Treatment Activities: Education provided on Nutrition : 09/02/2022 Referred to DME Pollyann Roa for dressing supplies : 09/02/2022 Support surface (Group 2/3) in use : 09/02/2022 Turning and Repositioning schedule in place : 09/02/2022 Notes: Electronic Signature(s) Signed: 10/08/2022 5:33:37 PM By: Shawn Stall RN,  BSN Entered By: Shawn Stall on 10/08/2022 13:06:07 -------------------------------------------------------------------------------- Pain Assessment Details Patient Name: Date of Service: [name] 10/08/2022 12:45 PM Medical Record Number: [id number] Patient Account Number: [id number] Date of Birth/Sex: Treating RN: 1965/04/23 (57 y.o. [name] Primary Care Savahna Casados: Gayleen Orem, Zena Amos Other Clinician: Referring Wilsie Kern: Treating Tobby Fawcett/Extender: Geralyn Corwin BRO WN, [name] Weeks in Treatment: 10 Active Problems Location of Pain Severity and Description of Pain Patient Has Paino No Site Locations Pain Management and Medication Current Pain Management: Electronic Signature(s) Signed: 10/08/2022 5:33:37 PM By: Shawn Stall RN, BSN Entered By: Shawn Stall on 10/08/2022 13:04:29 [name] ([id number]) [id number] Page 8 of 11 -------------------------------------------------------------------------------- Patient/Caregiver Education Details Patient Name: Date of Service: [name] 8/27/2024andnbsp12:45 PM Medical Record Number: [id number] Patient Account Number: [id number] Date of Birth/Gender: Treating RN: Jul 08, 1965 (57 y.o. [name] Primary Care Physician: Gayleen Orem, Zena Amos Other Clinician: Referring Physician: Treating Physician/Extender: Jule Ser WN, [name] Weeks in Treatment: 10 Education Assessment Education Provided To: Patient Education Topics Provided Wound/Skin Impairment: Handouts: Caring for Your Ulcer Methods: Explain/Verbal Responses: Reinforcements needed Electronic Signature(s) Signed: 10/08/2022 5:33:37 PM By: Shawn Stall RN, BSN Entered By: Shawn Stall on 10/08/2022 13:07:23 -------------------------------------------------------------------------------- Wound Assessment Details Patient Name: Date of Service: [name] 10/08/2022 12:45 PM Medical Record  Number: [id number] Patient Account Number: [id number] Date of Birth/Sex: Treating RN: [birth year] (57 y.o. M) Primary Care Cannen Dupras: Kalman Shan Other Clinician: Referring Lashannon Bresnan: Treating Tiari Andringa/Extender:  Hoffman, Jessica BRO WN, [name] Weeks in Treatment: 10 Wound Status Wound Number: 7 Primary Etiology: Pressure Ulcer Wound Location: Sacrum Wound Status: Open Wounding Event: Pressure Injury Comorbid History: Hypotension, Type II Diabetes, Osteomyelitis, Paraplegia Date Acquired: 07/12/2021 Weeks Of Treatment: 10 Clustered Wound: No Photos Wound Measurements Length: (cm) 5.8 Przybylski, Elder ([id number]) Width: (cm) 2 Depth: (cm) 4.5 Area: (cm) 9.11 Volume: (cm) 40.9 % Reduction in Area: 87% [id number] Page 9 of 11 % Reduction in Volume: 82.3% Epithelialization: None 1 Tunneling: Yes 98 Position (o'clock): 9 Maximum Distance: (cm) 5.5 Undermining: Yes Starting Position (o'clock): 7 Ending Position (o'clock): 1 Maximum Distance: (cm) 3.2 Wound Description Classification: Category/Stage IV Wound Margin: Distinct, outline attached Exudate Amount: Large Exudate Type: Serosanguineous Exudate Color: red, brown Foul Odor After Cleansing: No Slough/Fibrino Yes Wound Bed Granulation Amount: Medium (34-66%) Exposed Structure Necrotic Amount: Medium (34-66%) Fascia Exposed: No Necrotic Quality: Adherent Slough Fat Layer (Subcutaneous Tissue) Exposed: Yes Tendon Exposed: No Muscle Exposed: Yes Necrosis of Muscle: No Joint Exposed: No Bone Exposed: Yes Periwound Skin Texture Texture Color No Abnormalities Noted: No No Abnormalities Noted: No Callus: No Atrophie Blanche: No Crepitus: No Cyanosis: No Excoriation: No Ecchymosis: No Induration: No Erythema: No Rash: No Hemosiderin Staining: No Scarring: No Mottled: No Pallor: No Moisture Rubor: No No Abnormalities Noted: No Dry / Scaly: No Temperature / Pain Maceration:  No Temperature: No Abnormality Treatment Notes Wound #7 (Sacrum) Cleanser Wound Cleanser Discharge Instruction: Cleanse the wound with wound cleanser prior to applying a clean dressing using gauze sponges, not tissue or cotton balls. Peri-Wound Care Skin Prep Discharge Instruction: Use skin prep as directed Topical Primary Dressing Dakin's Solution 0.25%, 16 (oz) Discharge Instruction: Moisten gauze with Dakin's solution Secondary Dressing ABD Pad, 8x10 Discharge Instruction: Apply over primary dressing as directed. Secured With 43M Medipore H Soft Cloth Surgical T ape, 4 x 10 (in/yd) Discharge Instruction: Secure with tape as directed. Compression Wrap Compression Stockings Add-Ons WES, HOYER ([id number]) [id number] Page 10 of 11 Electronic Signature(s) Signed: 10/08/2022 4:24:20 PM By: Thayer Dallas Entered By: Thayer Dallas on 10/08/2022 13:03:51 -------------------------------------------------------------------------------- Wound Assessment Details Patient Name: Date of Service: [name] 10/08/2022 12:45 PM Medical Record Number: [id number] Patient Account Number: [id number] Date of Birth/Sex: Treating RN: 1965/05/23 (57 y.o. [name] Primary Care Leida Luton: Gayleen Orem, Zena Amos Other Clinician: Referring Yaviel Kloster: Treating Menaal Russum/Extender: Geralyn Corwin BRO WN, [name] Weeks in Treatment: 10 Wound Status Wound Number: 8 Primary Etiology: T be determined o Wound Location: Groin Wound Status: Open Wounding Event: Bump Comorbid History: Hypotension, Type II Diabetes, Osteomyelitis, Paraplegia Date Acquired: 10/08/2022 Weeks Of Treatment: 0 Clustered Wound: No Photos Wound Measurements Length: (cm) 1 Width: (cm) 0.2 Depth: (cm) 0.7 Area: (cm) 0.157 Volume: (cm) 0.11 % Reduction in Area: % Reduction in Volume: Epithelialization: None Tunneling: No Undermining: No Wound Description Classification: Full  Thickness Without Exposed Support Wound Margin: Epibole Exudate Amount: Medium Exudate Type: Serosanguineous Exudate Color: red, brown Structures Foul Odor After Cleansing: No Slough/Fibrino No Wound Bed Granulation Amount: Large (67-100%) Exposed Structure Granulation Quality: Pink Fascia Exposed: No Necrotic Amount: None Present (0%) Fat Layer (Subcutaneous Tissue) Exposed: Yes Tendon Exposed: No Muscle Exposed: No Joint Exposed: No Bone Exposed: No Periwound Skin Texture Texture Color No Abnormalities Noted: No No Abnormalities Noted: No Callus: No Atrophie Blanche: No Crepitus: No Cyanosis: No Excoriation: No Ecchymosis: No Induration: No Erythema: No Rash: No Hemosiderin Staining: No EMMA, DELLAPORTA ([id number]) [id number] Page 11 of 11 Scarring: No Mottled: No Pallor: No  Moisture Rubor: No No Abnormalities Noted: No Dry / Scaly: No Maceration: No Electronic Signature(s) Signed: 10/08/2022 4:24:20 PM By: Thayer Dallas Signed: 10/08/2022 5:33:37 PM By: Shawn Stall RN, BSN Entered By: Thayer Dallas on 10/08/2022 13:09:08 -------------------------------------------------------------------------------- Vitals Details Patient Name: Date of Service: [name], [name] 10/08/2022 12:45 PM Medical Record Number: [id number] Patient Account Number: [id number] Date of Birth/Sex: Treating RN: 1965-07-20 (57 y.o. [name], Millard.Loa Primary Care Xariah Silvernail: Gayleen Orem, Zena Amos Other Clinician: Referring Markon Jares: Treating Naren Benally/Extender: Geralyn Corwin BRO WN, [name] Weeks in Treatment: 10 Vital Signs Time Taken: 12:45 Temperature (F): 98.6 Pulse (bpm): 92 Respiratory Rate (breaths/min): 20 Blood Pressure (mmHg): 101/65 Reference Range: 80 - 120 mg / dl Electronic Signature(s) Signed: 10/08/2022 5:33:37 PM By: Shawn Stall RN, BSN Entered By: Shawn Stall on 10/08/2022 13:04:23
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
## 2022-10-29 ENCOUNTER — Encounter (HOSPITAL_BASED_OUTPATIENT_CLINIC_OR_DEPARTMENT_OTHER): Payer: 59 | Attending: Internal Medicine | Admitting: Internal Medicine

## 2022-10-29 DIAGNOSIS — E1151 Type 2 diabetes mellitus with diabetic peripheral angiopathy without gangrene: Secondary | ICD-10-CM | POA: Insufficient documentation

## 2022-10-29 DIAGNOSIS — G8221 Paraplegia, complete: Secondary | ICD-10-CM | POA: Diagnosis not present

## 2022-10-29 DIAGNOSIS — I872 Venous insufficiency (chronic) (peripheral): Secondary | ICD-10-CM | POA: Insufficient documentation

## 2022-10-29 DIAGNOSIS — Z794 Long term (current) use of insulin: Secondary | ICD-10-CM | POA: Insufficient documentation

## 2022-10-29 DIAGNOSIS — L89312 Pressure ulcer of right buttock, stage 2: Secondary | ICD-10-CM | POA: Insufficient documentation

## 2022-10-29 DIAGNOSIS — I890 Lymphedema, not elsewhere classified: Secondary | ICD-10-CM | POA: Insufficient documentation

## 2022-10-29 DIAGNOSIS — L89154 Pressure ulcer of sacral region, stage 4: Secondary | ICD-10-CM | POA: Diagnosis not present

## 2022-10-29 DIAGNOSIS — Z7901 Long term (current) use of anticoagulants: Secondary | ICD-10-CM | POA: Insufficient documentation

## 2022-10-29 DIAGNOSIS — E11622 Type 2 diabetes mellitus with other skin ulcer: Secondary | ICD-10-CM | POA: Diagnosis present

## 2022-10-30 NOTE — Progress Notes (Signed)
S 10/29/2022 12:30 PM Medical Record Number: [id number] Patient Account Number: [id number] Date of Birth/Sex: Treating RN: May 19, 1965 (57 y.o. [name] Primary Care Provider: Gayleen [name], Zena Amos Other Clinician: Referring Provider: Treating Provider/Extender: [name] WN, [name] Weeks in Treatment: 33 Verbal / Phone Orders: No Diagnosis Coding ICD-10 Coding Code Description L89.154 Pressure ulcer of sacral region, stage 4 E11.622 Type 2 diabetes mellitus with other skin ulcer M46.28 Osteomyelitis of vertebra, sacral and sacrococcygeal region S31.501A Unspecified open wound of unspecified external genital organs, male, initial encounter G82.21 Paraplegia, complete Follow-up Appointments Return appointment in 1 month. - Dr. Leanord [name] ROOM 8 11/26/2022 1245 extra time 60 minutes *****HOYER***** Other: - Call [employer] [name] with concerns warmth of the group 3 bed, Bathing/ Shower/ Hygiene May shower and wash wound with soap and water. - Dial gold antibacterial soap ( liquid) Off-Loading Wound #7 Sacrum A fluidized (Group 3) mattress - Continue to use group 3 mattress from [employer] ir Roho cushion for wheelchair Turn and reposition every 2 hours Additional Orders / Instructions Follow Nutritious Diet - increase protein Juven Shake 1-2 times daily. Home Health Wound #7 Sacrum No change in wound care orders this  week; continue Home Health for wound care. May utilize formulary equivalent dressing for wound treatment orders unless otherwise specified. - home health to change weekly. dakin's wet to dry with abd pad to cover. family to change all other days. continue groin mupirocin ointment daily with bordered foam. Apply calcium alignate with bordered foam to right gluteal wound. Other Home Health Orders/Instructions: - [facility] Wound Treatment Wound #7 - Sacrum Cleanser: Wound Cleanser (Home Health) 1 x Per Day/30 Days Discharge Instructions: Cleanse the wound with wound cleanser prior to applying a clean dressing using gauze sponges, not tissue or cotton balls. Peri-Wound Care: Skin Prep (Home Health) 1 x Per Day/30 Days Discharge Instructions: Use skin prep as directed Peri-Wound Care: AandD ointment 1 x Per Day/30 Days Discharge Instructions: apply to any excoriated area. Prim Dressing: Dakin's Solution 0.25%, 16 (oz) 1 x Per Day/30 Days ary Discharge Instructions: Moisten gauze with Dakin's solution Secondary Dressing: ABD Pad, 8x10 1 x Per Day/30 Days Discharge Instructions: Apply over primary dressing as directed. Wound #8 - Groin Cleanser: Vashe 5.8 (oz) (Home Health) 1 x Per Day/30 Days Discharge Instructions: Cleanse the wound with Vashe prior to applying a clean dressing using gauze sponges, not tissue or cotton balls. Cleanser: Wound Cleanser [facility]) 1 x Per Day/30 Days [name], [name] ([id number]) [phone].pdf Page 4 of 8 Discharge Instructions: Cleanse the wound with wound cleanser prior to applying a clean dressing using gauze sponges, not tissue or cotton balls. Peri-Wound Care: AandD ointment 1 x Per Day/30 Days Discharge Instructions: apply to any excoriated area. Topical: Mupirocin Ointment 1 x Per Day/30 Days Discharge Instructions: Apply Mupirocin (Bactroban) as instructed Secondary Dressing: Zetuvit Plus Silicone Border Dressing  4x4 (in/in) (Home Health) 1 x Per Day/30 Days Discharge Instructions: Apply silicone border over primary dressing as directed. Wound #9 - Gluteus Wound Laterality: Right Cleanser: Vashe 5.8 (oz) (Home Health) 1 x Per Day/30 Days Discharge Instructions: Cleanse the wound with Vashe prior to applying a clean dressing using gauze sponges, not tissue or cotton balls. Cleanser: Wound Cleanser (Home Health) 1 x Per Day/30 Days Discharge Instructions: Cleanse the wound with wound cleanser prior to applying a clean dressing using gauze sponges, not tissue or cotton balls. Peri-Wound Care: Skin Prep (Home Health) 1 x Per Day/30 Days Discharge  [name] ([id number]) [id number] Page 1 of 8 Visit Report for 10/29/2022 HPI Details Patient Name: Date of Service: [name] 10/29/2022 12:30 PM Medical Record Number: [id number] Patient Account Number: [id number] Date of Birth/Sex: Treating RN: 15-Mar-1965 (57 y.o. M) Primary Care Provider: Gayleen [name], Zena Amos Other Clinician: Referring Provider: Treating Provider/Extender: [name] WN, [name] Weeks in Treatment: 13 History of Present Illness HPI Description: ADMISSION 10/02/2018 This is a 57 year old Spanish-speaking man who arrived accompanied by his wife. 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 Apparently 6 weeks ago his wife noted blisters on his bilateral fifth metatarsal heads and heels which she feels was from excessive pressure on the foot rests of his wheelchair. He was seen in the emergency room early in August and then was admitted to hospital from 8/10 through 8/14 with cellulitis of his feet. He was ultimately discharged on Keflex. Arterial studies were done in the hospital that showed an ABI of 1.61 on the right and 1.36 on the left but triphasic waveforms on the left and biphasic on the right. DVT rule out study was negative. X-ray of the bilateral feet did not show osteomyelitis. They have been dealing with these wounds at home. I think they are using Betadine. He has Medicaid and does not have home health. He comes in today with wounds on his bilateral plantar fifth metatarsal heads Achilles area of both heels and an area on the left lateral leg. His wife explains this as he always laterally rotates his legs when he is sitting in the wheelchair or in bed. She even seemed to believe that before his injury he actually walked on the outside of his feet. 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 Paraplegia T1-T6 8/27; the patient has 4 open wounds mirror-image areas over the plantar fifth metatarsal heads. Area on the right Achilles area and then on the left lateral calf. We have been using Iodoflex. He is on Eliquis 9/2 debridement I did last week caused copious amounts of bleeding which was difficult to control. He is on Eliquis. He has the 2 mirror-image wounds over the plantar fifth metatarsal heads in the area on the right Achilles and then an area on the left lateral calf. Both the fifth metatarsal head wounds on the left lateral calf of necrotic eschar on the surface. Black discoloration likely partially the silver nitrate we had to use last time 9/9; he arrives back in clinic today with the wounds on the plantar fifth metatarsal heads and exactly the same nonviable situation. He also has an area on the Achilles part of his right heel and the left lateral leg in a very similar situation. Nothing is making much progress I took some time to review his arterial studies from his hospitalization before he came to this clinic. On the right he had noncompressible ABIs with biphasic waveforms. They did not do a TBI in the right. On the left he had noncompressible ABIs at 1.36. However his TBI was 0.70 with triphasic waveforms at the dorsalis pedis he had a DVT rule out study that was negative. We have been using Iodoflex without much progress back in compression as he does not have access to wound care supplies 9/17; he comes back in with the same adherent eschar over both fifth met heads. I am really uncertain about  7/29; patient presents for follow-up. The wound VAC was started 1 week ago and home health is changed the wound VAC. He has no issues or complaints. He has tried to offload the area better. He is receiving his group 3 air mattress this week. He denies systemic signs of infection. 8/15; patient presents for follow-up. He has been using the wound VAC however has had difficulty keeping a seal. There has been drainage outside of the wound VAC. He has been using his group 3 air mattress without issues. He currently denies systemic signs of infection. 8/27; patient presents for follow-up. He has been using Dakin's wet-to-dry dressing to the sacral wound. He has been using his group 3 air mattress however reports that the mattress gets too warm for him. He has a new wound to the left groin. He currently denies signs of infection. He was recently in the hospital for acute pyelonephritis and was discharged with Augmentin for 1 week. He did have a CT scan during his hospitalization of his abdomen and pelvis that did comment on the sacral wound showing findings consistent with chronic osteomyelitis. Patient has already been treated with 6 weeks of antibiotics for this issue. 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 This is a pressure ulcer in a patient with chronic paraplegia secondary to a fall that he suffered several years ago. They have been using Dakin's wet to dry. Apparently we did try a wound VAC on him and for 1 reason or another things just did not get any better. There were lots of problems getting skilled nurses into the vet into his home to do the [facility] changes etc. At 1 time this had quite an odor however with the Dakin's wet-to-dry that is improved. In conversation with his wife it appears that this man does not offload this and really has no interest in offloading it at this point. He has a new wound this week on the right buttock which is a stage II and a smaller area on the right buttock close proximity to his groin area. Both of these are superficial. Electronic Signature(s) Signed: 10/29/2022 4:34:08 PM By: Baltazar Najjar MD Entered By: Baltazar Najjar on 10/29/2022 10:40:51 -------------------------------------------------------------------------------- Physical Exam Details Patient Name: Date of Service: [name], [name] 10/29/2022 12:30 PM Medical Record Number: [id number] Patient Account Number: [id number] Date of Birth/Sex: Treating RN: 06/19/1965 (57 y.o. M) Primary Care Provider: Gayleen [name], Zena Amos Other Clinician: Referring Provider: Treating Provider/Extender: [name] WN, [name] Weeks in Treatment: 13 Constitutional Patient is hypotensive.However he does not appear unwell. Pulse regular and within target range for patient.Marland Kitchen Respirations regular, non-labored and within target range.. Temperature is normal and within the target range for the patient.Marland Kitchen Appears in no distress. Notes Wound exam; the area questions on the lower sacral area is the major wound area. This is fairly deep he has the lower part of his coccyx palpable in the middle of this. The granulation tissue that is present looks reasonable. There is no surrounding erythema and no  purulent drainage. He has a new area this week on the right buttock which is a stage II and a smaller area on the right buttock towards the groin area also superficial stage II Electronic Signature(s) Signed: 10/29/2022 4:34:08 PM By: Baltazar Najjar MD Entered By: Baltazar Najjar on 10/29/2022 10:42:44 [name] ([id number]) [id number] Page 3 of 8 -------------------------------------------------------------------------------- Physician Orders Details Patient Name: Date of Service: [name], [place] RLO  S 10/29/2022 12:30 PM Medical Record Number: [id number] Patient Account Number: [id number] Date of Birth/Sex: Treating RN: May 19, 1965 (57 y.o. [name] Primary Care Provider: Gayleen [name], Zena Amos Other Clinician: Referring Provider: Treating Provider/Extender: [name] WN, [name] Weeks in Treatment: 33 Verbal / Phone Orders: No Diagnosis Coding ICD-10 Coding Code Description L89.154 Pressure ulcer of sacral region, stage 4 E11.622 Type 2 diabetes mellitus with other skin ulcer M46.28 Osteomyelitis of vertebra, sacral and sacrococcygeal region S31.501A Unspecified open wound of unspecified external genital organs, male, initial encounter G82.21 Paraplegia, complete Follow-up Appointments Return appointment in 1 month. - Dr. Leanord [name] ROOM 8 11/26/2022 1245 extra time 60 minutes *****HOYER***** Other: - Call [employer] [name] with concerns warmth of the group 3 bed, Bathing/ Shower/ Hygiene May shower and wash wound with soap and water. - Dial gold antibacterial soap ( liquid) Off-Loading Wound #7 Sacrum A fluidized (Group 3) mattress - Continue to use group 3 mattress from [employer] ir Roho cushion for wheelchair Turn and reposition every 2 hours Additional Orders / Instructions Follow Nutritious Diet - increase protein Juven Shake 1-2 times daily. Home Health Wound #7 Sacrum No change in wound care orders this  week; continue Home Health for wound care. May utilize formulary equivalent dressing for wound treatment orders unless otherwise specified. - home health to change weekly. dakin's wet to dry with abd pad to cover. family to change all other days. continue groin mupirocin ointment daily with bordered foam. Apply calcium alignate with bordered foam to right gluteal wound. Other Home Health Orders/Instructions: - [facility] Wound Treatment Wound #7 - Sacrum Cleanser: Wound Cleanser (Home Health) 1 x Per Day/30 Days Discharge Instructions: Cleanse the wound with wound cleanser prior to applying a clean dressing using gauze sponges, not tissue or cotton balls. Peri-Wound Care: Skin Prep (Home Health) 1 x Per Day/30 Days Discharge Instructions: Use skin prep as directed Peri-Wound Care: AandD ointment 1 x Per Day/30 Days Discharge Instructions: apply to any excoriated area. Prim Dressing: Dakin's Solution 0.25%, 16 (oz) 1 x Per Day/30 Days ary Discharge Instructions: Moisten gauze with Dakin's solution Secondary Dressing: ABD Pad, 8x10 1 x Per Day/30 Days Discharge Instructions: Apply over primary dressing as directed. Wound #8 - Groin Cleanser: Vashe 5.8 (oz) (Home Health) 1 x Per Day/30 Days Discharge Instructions: Cleanse the wound with Vashe prior to applying a clean dressing using gauze sponges, not tissue or cotton balls. Cleanser: Wound Cleanser [facility]) 1 x Per Day/30 Days [name], [name] ([id number]) [phone].pdf Page 4 of 8 Discharge Instructions: Cleanse the wound with wound cleanser prior to applying a clean dressing using gauze sponges, not tissue or cotton balls. Peri-Wound Care: AandD ointment 1 x Per Day/30 Days Discharge Instructions: apply to any excoriated area. Topical: Mupirocin Ointment 1 x Per Day/30 Days Discharge Instructions: Apply Mupirocin (Bactroban) as instructed Secondary Dressing: Zetuvit Plus Silicone Border Dressing  4x4 (in/in) (Home Health) 1 x Per Day/30 Days Discharge Instructions: Apply silicone border over primary dressing as directed. Wound #9 - Gluteus Wound Laterality: Right Cleanser: Vashe 5.8 (oz) (Home Health) 1 x Per Day/30 Days Discharge Instructions: Cleanse the wound with Vashe prior to applying a clean dressing using gauze sponges, not tissue or cotton balls. Cleanser: Wound Cleanser (Home Health) 1 x Per Day/30 Days Discharge Instructions: Cleanse the wound with wound cleanser prior to applying a clean dressing using gauze sponges, not tissue or cotton balls. Peri-Wound Care: Skin Prep (Home Health) 1 x Per Day/30 Days Discharge  S 10/29/2022 12:30 PM Medical Record Number: [id number] Patient Account Number: [id number] Date of Birth/Sex: Treating RN: May 19, 1965 (57 y.o. [name] Primary Care Provider: Gayleen [name], Zena Amos Other Clinician: Referring Provider: Treating Provider/Extender: [name] WN, [name] Weeks in Treatment: 33 Verbal / Phone Orders: No Diagnosis Coding ICD-10 Coding Code Description L89.154 Pressure ulcer of sacral region, stage 4 E11.622 Type 2 diabetes mellitus with other skin ulcer M46.28 Osteomyelitis of vertebra, sacral and sacrococcygeal region S31.501A Unspecified open wound of unspecified external genital organs, male, initial encounter G82.21 Paraplegia, complete Follow-up Appointments Return appointment in 1 month. - Dr. Leanord [name] ROOM 8 11/26/2022 1245 extra time 60 minutes *****HOYER***** Other: - Call [employer] [name] with concerns warmth of the group 3 bed, Bathing/ Shower/ Hygiene May shower and wash wound with soap and water. - Dial gold antibacterial soap ( liquid) Off-Loading Wound #7 Sacrum A fluidized (Group 3) mattress - Continue to use group 3 mattress from [employer] ir Roho cushion for wheelchair Turn and reposition every 2 hours Additional Orders / Instructions Follow Nutritious Diet - increase protein Juven Shake 1-2 times daily. Home Health Wound #7 Sacrum No change in wound care orders this  week; continue Home Health for wound care. May utilize formulary equivalent dressing for wound treatment orders unless otherwise specified. - home health to change weekly. dakin's wet to dry with abd pad to cover. family to change all other days. continue groin mupirocin ointment daily with bordered foam. Apply calcium alignate with bordered foam to right gluteal wound. Other Home Health Orders/Instructions: - [facility] Wound Treatment Wound #7 - Sacrum Cleanser: Wound Cleanser (Home Health) 1 x Per Day/30 Days Discharge Instructions: Cleanse the wound with wound cleanser prior to applying a clean dressing using gauze sponges, not tissue or cotton balls. Peri-Wound Care: Skin Prep (Home Health) 1 x Per Day/30 Days Discharge Instructions: Use skin prep as directed Peri-Wound Care: AandD ointment 1 x Per Day/30 Days Discharge Instructions: apply to any excoriated area. Prim Dressing: Dakin's Solution 0.25%, 16 (oz) 1 x Per Day/30 Days ary Discharge Instructions: Moisten gauze with Dakin's solution Secondary Dressing: ABD Pad, 8x10 1 x Per Day/30 Days Discharge Instructions: Apply over primary dressing as directed. Wound #8 - Groin Cleanser: Vashe 5.8 (oz) (Home Health) 1 x Per Day/30 Days Discharge Instructions: Cleanse the wound with Vashe prior to applying a clean dressing using gauze sponges, not tissue or cotton balls. Cleanser: Wound Cleanser [facility]) 1 x Per Day/30 Days [name], [name] ([id number]) [phone].pdf Page 4 of 8 Discharge Instructions: Cleanse the wound with wound cleanser prior to applying a clean dressing using gauze sponges, not tissue or cotton balls. Peri-Wound Care: AandD ointment 1 x Per Day/30 Days Discharge Instructions: apply to any excoriated area. Topical: Mupirocin Ointment 1 x Per Day/30 Days Discharge Instructions: Apply Mupirocin (Bactroban) as instructed Secondary Dressing: Zetuvit Plus Silicone Border Dressing  4x4 (in/in) (Home Health) 1 x Per Day/30 Days Discharge Instructions: Apply silicone border over primary dressing as directed. Wound #9 - Gluteus Wound Laterality: Right Cleanser: Vashe 5.8 (oz) (Home Health) 1 x Per Day/30 Days Discharge Instructions: Cleanse the wound with Vashe prior to applying a clean dressing using gauze sponges, not tissue or cotton balls. Cleanser: Wound Cleanser (Home Health) 1 x Per Day/30 Days Discharge Instructions: Cleanse the wound with wound cleanser prior to applying a clean dressing using gauze sponges, not tissue or cotton balls. Peri-Wound Care: Skin Prep (Home Health) 1 x Per Day/30 Days Discharge  Instructions: Use skin prep as directed Peri-Wound Care: Aand D ointment 1 x Per Day/30 Days Discharge Instructions: apply to any excoriated area. Prim Dressing: Maxorb Extra Calcium Alginate, 2x2 (in/in) (Home Health) 1 x Per Day/30 Days ary Discharge Instructions: Apply to wound bed as instructed Secondary Dressing: Zetuvit Plus Silicone Border Dressing 4x4 (in/in) (Home Health) 1 x Per Day/30 Days Discharge Instructions: Apply silicone border over primary dressing as directed. Electronic Signature(s) Signed: 10/29/2022 4:34:08 PM By: Baltazar Najjar MD Signed: 10/29/2022 5:50:35 PM By: Shawn Stall RN, BSN Entered By: Shawn Stall on 10/29/2022 10:15:51 -------------------------------------------------------------------------------- Problem List Details Patient Name: Date of Service: [name], [name] 10/29/2022 12:30 PM Medical Record Number: [id number] Patient Account Number: [id number] Date of Birth/Sex: Treating RN: [birth year] (57 y.o. [name] Primary Care Provider: Gayleen [name], Zena Amos Other Clinician: Referring Provider: Treating Provider/Extender: [name] WN, [name] Weeks in Treatment: 13 Active Problems ICD-10 Encounter Code Description Active Date MDM Diagnosis L89.154 Pressure ulcer of sacral region, stage 4 07/25/2022  No Yes M46.28 Osteomyelitis of vertebra, sacral and sacrococcygeal region 10/08/2022 No Yes L89.312 Pressure ulcer of right buttock, stage 2 10/29/2022 No Yes S31.501A Unspecified open wound of unspecified external genital organs, male, initial 10/08/2022 No Yes encounter SCHON, ETHEREDGE ([id number]) [id number] Page 5 of 8 E11.622 Type 2 diabetes mellitus with other skin ulcer 07/25/2022 No Yes G82.21 Paraplegia, complete 07/25/2022 No Yes Inactive Problems Resolved Problems Electronic Signature(s) Signed: 10/29/2022 4:34:08 PM By: Baltazar Najjar MD Entered By: Baltazar Najjar on 10/29/2022 10:37:16 -------------------------------------------------------------------------------- Progress Note Details Patient Name: Date of Service: [name], [name] 10/29/2022 12:30 PM Medical Record Number: [id number] Patient Account Number: [id number] Date of Birth/Sex: Treating RN: 1965-06-29 (57 y.o. M) Primary Care Provider: Gayleen [name], Zena Amos Other Clinician: Referring Provider: Treating Provider/Extender: [name] WN, [name] Weeks in Treatment: 13 Subjective History of Present Illness (HPI) ADMISSION 10/02/2018 This is a 57 year old Spanish-speaking man who arrived accompanied by his wife. 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 Apparently 6 weeks ago his wife noted blisters on his bilateral fifth metatarsal heads and heels which she feels was from excessive pressure on the foot rests of his wheelchair. He was seen in the emergency room early in August and then was admitted to hospital from 8/10 through 8/14 with cellulitis of his feet. He was ultimately discharged on Keflex. Arterial studies were done in the hospital that showed an ABI of 1.61 on the right and 1.36 on the left but triphasic waveforms on the left and biphasic on the right. DVT rule out study was negative. X-ray  of the bilateral feet did not show osteomyelitis. They have been dealing with these wounds at home. I think they are using Betadine. He has Medicaid and does not have home health. He comes in today with wounds on his bilateral plantar fifth metatarsal heads Achilles area of both heels and an area on the left lateral leg. His wife explains this as he always laterally rotates his legs when he is sitting in the wheelchair or in bed. She even seemed to believe that before his injury he actually walked on the outside of his feet. 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 Paraplegia T1-T6 8/27; the patient has 4 open wounds mirror-image areas over the plantar fifth metatarsal heads. Area on the right Achilles area and  7/29; patient presents for follow-up. The wound VAC was started 1 week ago and home health is changed the wound VAC. He has no issues or complaints. He has tried to offload the area better. He is receiving his group 3 air mattress this week. He denies systemic signs of infection. 8/15; patient presents for follow-up. He has been using the wound VAC however has had difficulty keeping a seal. There has been drainage outside of the wound VAC. He has been using his group 3 air mattress without issues. He currently denies systemic signs of infection. 8/27; patient presents for follow-up. He has been using Dakin's wet-to-dry dressing to the sacral wound. He has been using his group 3 air mattress however reports that the mattress gets too warm for him. He has a new wound to the left groin. He currently denies signs of infection. He was recently in the hospital for acute pyelonephritis and was discharged with Augmentin for 1 week. He did have a CT scan during his hospitalization of his abdomen and pelvis that did comment on the sacral wound showing findings consistent with chronic osteomyelitis. Patient has already been treated with 6 weeks of antibiotics for this issue. 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 This is a pressure ulcer in a patient with chronic paraplegia secondary to a fall that he suffered several years ago. They have been using Dakin's wet to dry. Apparently we did try a wound VAC on him and for 1 reason or another things just did not get any better. There were lots of problems getting skilled nurses into the vet into his home to do the [facility] changes etc. At 1 time this had quite an odor however with the Dakin's wet-to-dry that is improved. In conversation with his wife it appears that this man does not offload this and really has no interest in offloading it at this point. He has a new wound this week on the right buttock which is a stage II and a smaller area on the right buttock close proximity to his groin area. Both of these are superficial. Electronic Signature(s) Signed: 10/29/2022 4:34:08 PM By: Baltazar Najjar MD Entered By: Baltazar Najjar on 10/29/2022 10:40:51 -------------------------------------------------------------------------------- Physical Exam Details Patient Name: Date of Service: [name], [name] 10/29/2022 12:30 PM Medical Record Number: [id number] Patient Account Number: [id number] Date of Birth/Sex: Treating RN: 06/19/1965 (57 y.o. M) Primary Care Provider: Gayleen [name], Zena Amos Other Clinician: Referring Provider: Treating Provider/Extender: [name] WN, [name] Weeks in Treatment: 13 Constitutional Patient is hypotensive.However he does not appear unwell. Pulse regular and within target range for patient.Marland Kitchen Respirations regular, non-labored and within target range.. Temperature is normal and within the target range for the patient.Marland Kitchen Appears in no distress. Notes Wound exam; the area questions on the lower sacral area is the major wound area. This is fairly deep he has the lower part of his coccyx palpable in the middle of this. The granulation tissue that is present looks reasonable. There is no surrounding erythema and no  purulent drainage. He has a new area this week on the right buttock which is a stage II and a smaller area on the right buttock towards the groin area also superficial stage II Electronic Signature(s) Signed: 10/29/2022 4:34:08 PM By: Baltazar Najjar MD Entered By: Baltazar Najjar on 10/29/2022 10:42:44 [name] ([id number]) [id number] Page 3 of 8 -------------------------------------------------------------------------------- Physician Orders Details Patient Name: Date of Service: [name], [place] RLO  7/29; patient presents for follow-up. The wound VAC was started 1 week ago and home health is changed the wound VAC. He has no issues or complaints. He has tried to offload the area better. He is receiving his group 3 air mattress this week. He denies systemic signs of infection. 8/15; patient presents for follow-up. He has been using the wound VAC however has had difficulty keeping a seal. There has been drainage outside of the wound VAC. He has been using his group 3 air mattress without issues. He currently denies systemic signs of infection. 8/27; patient presents for follow-up. He has been using Dakin's wet-to-dry dressing to the sacral wound. He has been using his group 3 air mattress however reports that the mattress gets too warm for him. He has a new wound to the left groin. He currently denies signs of infection. He was recently in the hospital for acute pyelonephritis and was discharged with Augmentin for 1 week. He did have a CT scan during his hospitalization of his abdomen and pelvis that did comment on the sacral wound showing findings consistent with chronic osteomyelitis. Patient has already been treated with 6 weeks of antibiotics for this issue. 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 This is a pressure ulcer in a patient with chronic paraplegia secondary to a fall that he suffered several years ago. They have been using Dakin's wet to dry. Apparently we did try a wound VAC on him and for 1 reason or another things just did not get any better. There were lots of problems getting skilled nurses into the vet into his home to do the [facility] changes etc. At 1 time this had quite an odor however with the Dakin's wet-to-dry that is improved. In conversation with his wife it appears that this man does not offload this and really has no interest in offloading it at this point. He has a new wound this week on the right buttock which is a stage II and a smaller area on the right buttock close proximity to his groin area. Both of these are superficial. Electronic Signature(s) Signed: 10/29/2022 4:34:08 PM By: Baltazar Najjar MD Entered By: Baltazar Najjar on 10/29/2022 10:40:51 -------------------------------------------------------------------------------- Physical Exam Details Patient Name: Date of Service: [name], [name] 10/29/2022 12:30 PM Medical Record Number: [id number] Patient Account Number: [id number] Date of Birth/Sex: Treating RN: 06/19/1965 (57 y.o. M) Primary Care Provider: Gayleen [name], Zena Amos Other Clinician: Referring Provider: Treating Provider/Extender: [name] WN, [name] Weeks in Treatment: 13 Constitutional Patient is hypotensive.However he does not appear unwell. Pulse regular and within target range for patient.Marland Kitchen Respirations regular, non-labored and within target range.. Temperature is normal and within the target range for the patient.Marland Kitchen Appears in no distress. Notes Wound exam; the area questions on the lower sacral area is the major wound area. This is fairly deep he has the lower part of his coccyx palpable in the middle of this. The granulation tissue that is present looks reasonable. There is no surrounding erythema and no  purulent drainage. He has a new area this week on the right buttock which is a stage II and a smaller area on the right buttock towards the groin area also superficial stage II Electronic Signature(s) Signed: 10/29/2022 4:34:08 PM By: Baltazar Najjar MD Entered By: Baltazar Najjar on 10/29/2022 10:42:44 [name] ([id number]) [id number] Page 3 of 8 -------------------------------------------------------------------------------- Physician Orders Details Patient Name: Date of Service: [name], [place] RLO  Instructions: Use skin prep as directed Peri-Wound Care: Aand D ointment 1 x Per Day/30 Days Discharge Instructions: apply to any excoriated area. Prim Dressing: Maxorb Extra Calcium Alginate, 2x2 (in/in) (Home Health) 1 x Per Day/30 Days ary Discharge Instructions: Apply to wound bed as instructed Secondary Dressing: Zetuvit Plus Silicone Border Dressing 4x4 (in/in) (Home Health) 1 x Per Day/30 Days Discharge Instructions: Apply silicone border over primary dressing as directed. Electronic Signature(s) Signed: 10/29/2022 4:34:08 PM By: Baltazar Najjar MD Signed: 10/29/2022 5:50:35 PM By: Shawn Stall RN, BSN Entered By: Shawn Stall on 10/29/2022 10:15:51 -------------------------------------------------------------------------------- Problem List Details Patient Name: Date of Service: [name], [name] 10/29/2022 12:30 PM Medical Record Number: [id number] Patient Account Number: [id number] Date of Birth/Sex: Treating RN: [birth year] (57 y.o. [name] Primary Care Provider: Gayleen [name], Zena Amos Other Clinician: Referring Provider: Treating Provider/Extender: [name] WN, [name] Weeks in Treatment: 13 Active Problems ICD-10 Encounter Code Description Active Date MDM Diagnosis L89.154 Pressure ulcer of sacral region, stage 4 07/25/2022  No Yes M46.28 Osteomyelitis of vertebra, sacral and sacrococcygeal region 10/08/2022 No Yes L89.312 Pressure ulcer of right buttock, stage 2 10/29/2022 No Yes S31.501A Unspecified open wound of unspecified external genital organs, male, initial 10/08/2022 No Yes encounter SCHON, ETHEREDGE ([id number]) [id number] Page 5 of 8 E11.622 Type 2 diabetes mellitus with other skin ulcer 07/25/2022 No Yes G82.21 Paraplegia, complete 07/25/2022 No Yes Inactive Problems Resolved Problems Electronic Signature(s) Signed: 10/29/2022 4:34:08 PM By: Baltazar Najjar MD Entered By: Baltazar Najjar on 10/29/2022 10:37:16 -------------------------------------------------------------------------------- Progress Note Details Patient Name: Date of Service: [name], [name] 10/29/2022 12:30 PM Medical Record Number: [id number] Patient Account Number: [id number] Date of Birth/Sex: Treating RN: 1965-06-29 (57 y.o. M) Primary Care Provider: Gayleen [name], Zena Amos Other Clinician: Referring Provider: Treating Provider/Extender: [name] WN, [name] Weeks in Treatment: 13 Subjective History of Present Illness (HPI) ADMISSION 10/02/2018 This is a 57 year old Spanish-speaking man who arrived accompanied by his wife. 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 Apparently 6 weeks ago his wife noted blisters on his bilateral fifth metatarsal heads and heels which she feels was from excessive pressure on the foot rests of his wheelchair. He was seen in the emergency room early in August and then was admitted to hospital from 8/10 through 8/14 with cellulitis of his feet. He was ultimately discharged on Keflex. Arterial studies were done in the hospital that showed an ABI of 1.61 on the right and 1.36 on the left but triphasic waveforms on the left and biphasic on the right. DVT rule out study was negative. X-ray  of the bilateral feet did not show osteomyelitis. They have been dealing with these wounds at home. I think they are using Betadine. He has Medicaid and does not have home health. He comes in today with wounds on his bilateral plantar fifth metatarsal heads Achilles area of both heels and an area on the left lateral leg. His wife explains this as he always laterally rotates his legs when he is sitting in the wheelchair or in bed. She even seemed to believe that before his injury he actually walked on the outside of his feet. 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 Paraplegia T1-T6 8/27; the patient has 4 open wounds mirror-image areas over the plantar fifth metatarsal heads. Area on the right Achilles area and  Instructions: Use skin prep as directed Peri-Wound Care: Aand D ointment 1 x Per Day/30 Days Discharge Instructions: apply to any excoriated area. Prim Dressing: Maxorb Extra Calcium Alginate, 2x2 (in/in) (Home Health) 1 x Per Day/30 Days ary Discharge Instructions: Apply to wound bed as instructed Secondary Dressing: Zetuvit Plus Silicone Border Dressing 4x4 (in/in) (Home Health) 1 x Per Day/30 Days Discharge Instructions: Apply silicone border over primary dressing as directed. Electronic Signature(s) Signed: 10/29/2022 4:34:08 PM By: Baltazar Najjar MD Signed: 10/29/2022 5:50:35 PM By: Shawn Stall RN, BSN Entered By: Shawn Stall on 10/29/2022 10:15:51 -------------------------------------------------------------------------------- Problem List Details Patient Name: Date of Service: [name], [name] 10/29/2022 12:30 PM Medical Record Number: [id number] Patient Account Number: [id number] Date of Birth/Sex: Treating RN: [birth year] (57 y.o. [name] Primary Care Provider: Gayleen [name], Zena Amos Other Clinician: Referring Provider: Treating Provider/Extender: [name] WN, [name] Weeks in Treatment: 13 Active Problems ICD-10 Encounter Code Description Active Date MDM Diagnosis L89.154 Pressure ulcer of sacral region, stage 4 07/25/2022  No Yes M46.28 Osteomyelitis of vertebra, sacral and sacrococcygeal region 10/08/2022 No Yes L89.312 Pressure ulcer of right buttock, stage 2 10/29/2022 No Yes S31.501A Unspecified open wound of unspecified external genital organs, male, initial 10/08/2022 No Yes encounter SCHON, ETHEREDGE ([id number]) [id number] Page 5 of 8 E11.622 Type 2 diabetes mellitus with other skin ulcer 07/25/2022 No Yes G82.21 Paraplegia, complete 07/25/2022 No Yes Inactive Problems Resolved Problems Electronic Signature(s) Signed: 10/29/2022 4:34:08 PM By: Baltazar Najjar MD Entered By: Baltazar Najjar on 10/29/2022 10:37:16 -------------------------------------------------------------------------------- Progress Note Details Patient Name: Date of Service: [name], [name] 10/29/2022 12:30 PM Medical Record Number: [id number] Patient Account Number: [id number] Date of Birth/Sex: Treating RN: 1965-06-29 (57 y.o. M) Primary Care Provider: Gayleen [name], Zena Amos Other Clinician: Referring Provider: Treating Provider/Extender: [name] WN, [name] Weeks in Treatment: 13 Subjective History of Present Illness (HPI) ADMISSION 10/02/2018 This is a 57 year old Spanish-speaking man who arrived accompanied by his wife. 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 Apparently 6 weeks ago his wife noted blisters on his bilateral fifth metatarsal heads and heels which she feels was from excessive pressure on the foot rests of his wheelchair. He was seen in the emergency room early in August and then was admitted to hospital from 8/10 through 8/14 with cellulitis of his feet. He was ultimately discharged on Keflex. Arterial studies were done in the hospital that showed an ABI of 1.61 on the right and 1.36 on the left but triphasic waveforms on the left and biphasic on the right. DVT rule out study was negative. X-ray  of the bilateral feet did not show osteomyelitis. They have been dealing with these wounds at home. I think they are using Betadine. He has Medicaid and does not have home health. He comes in today with wounds on his bilateral plantar fifth metatarsal heads Achilles area of both heels and an area on the left lateral leg. His wife explains this as he always laterally rotates his legs when he is sitting in the wheelchair or in bed. She even seemed to believe that before his injury he actually walked on the outside of his feet. 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 Paraplegia T1-T6 8/27; the patient has 4 open wounds mirror-image areas over the plantar fifth metatarsal heads. Area on the right Achilles area and  S 10/29/2022 12:30 PM Medical Record Number: [id number] Patient Account Number: [id number] Date of Birth/Sex: Treating RN: May 19, 1965 (57 y.o. [name] Primary Care Provider: Gayleen [name], Zena Amos Other Clinician: Referring Provider: Treating Provider/Extender: [name] WN, [name] Weeks in Treatment: 33 Verbal / Phone Orders: No Diagnosis Coding ICD-10 Coding Code Description L89.154 Pressure ulcer of sacral region, stage 4 E11.622 Type 2 diabetes mellitus with other skin ulcer M46.28 Osteomyelitis of vertebra, sacral and sacrococcygeal region S31.501A Unspecified open wound of unspecified external genital organs, male, initial encounter G82.21 Paraplegia, complete Follow-up Appointments Return appointment in 1 month. - Dr. Leanord [name] ROOM 8 11/26/2022 1245 extra time 60 minutes *****HOYER***** Other: - Call [employer] [name] with concerns warmth of the group 3 bed, Bathing/ Shower/ Hygiene May shower and wash wound with soap and water. - Dial gold antibacterial soap ( liquid) Off-Loading Wound #7 Sacrum A fluidized (Group 3) mattress - Continue to use group 3 mattress from [employer] ir Roho cushion for wheelchair Turn and reposition every 2 hours Additional Orders / Instructions Follow Nutritious Diet - increase protein Juven Shake 1-2 times daily. Home Health Wound #7 Sacrum No change in wound care orders this  week; continue Home Health for wound care. May utilize formulary equivalent dressing for wound treatment orders unless otherwise specified. - home health to change weekly. dakin's wet to dry with abd pad to cover. family to change all other days. continue groin mupirocin ointment daily with bordered foam. Apply calcium alignate with bordered foam to right gluteal wound. Other Home Health Orders/Instructions: - [facility] Wound Treatment Wound #7 - Sacrum Cleanser: Wound Cleanser (Home Health) 1 x Per Day/30 Days Discharge Instructions: Cleanse the wound with wound cleanser prior to applying a clean dressing using gauze sponges, not tissue or cotton balls. Peri-Wound Care: Skin Prep (Home Health) 1 x Per Day/30 Days Discharge Instructions: Use skin prep as directed Peri-Wound Care: AandD ointment 1 x Per Day/30 Days Discharge Instructions: apply to any excoriated area. Prim Dressing: Dakin's Solution 0.25%, 16 (oz) 1 x Per Day/30 Days ary Discharge Instructions: Moisten gauze with Dakin's solution Secondary Dressing: ABD Pad, 8x10 1 x Per Day/30 Days Discharge Instructions: Apply over primary dressing as directed. Wound #8 - Groin Cleanser: Vashe 5.8 (oz) (Home Health) 1 x Per Day/30 Days Discharge Instructions: Cleanse the wound with Vashe prior to applying a clean dressing using gauze sponges, not tissue or cotton balls. Cleanser: Wound Cleanser [facility]) 1 x Per Day/30 Days [name], [name] ([id number]) [phone].pdf Page 4 of 8 Discharge Instructions: Cleanse the wound with wound cleanser prior to applying a clean dressing using gauze sponges, not tissue or cotton balls. Peri-Wound Care: AandD ointment 1 x Per Day/30 Days Discharge Instructions: apply to any excoriated area. Topical: Mupirocin Ointment 1 x Per Day/30 Days Discharge Instructions: Apply Mupirocin (Bactroban) as instructed Secondary Dressing: Zetuvit Plus Silicone Border Dressing  4x4 (in/in) (Home Health) 1 x Per Day/30 Days Discharge Instructions: Apply silicone border over primary dressing as directed. Wound #9 - Gluteus Wound Laterality: Right Cleanser: Vashe 5.8 (oz) (Home Health) 1 x Per Day/30 Days Discharge Instructions: Cleanse the wound with Vashe prior to applying a clean dressing using gauze sponges, not tissue or cotton balls. Cleanser: Wound Cleanser (Home Health) 1 x Per Day/30 Days Discharge Instructions: Cleanse the wound with wound cleanser prior to applying a clean dressing using gauze sponges, not tissue or cotton balls. Peri-Wound Care: Skin Prep (Home Health) 1 x Per Day/30 Days Discharge  S 10/29/2022 12:30 PM Medical Record Number: [id number] Patient Account Number: [id number] Date of Birth/Sex: Treating RN: May 19, 1965 (57 y.o. [name] Primary Care Provider: Gayleen [name], Zena Amos Other Clinician: Referring Provider: Treating Provider/Extender: [name] WN, [name] Weeks in Treatment: 33 Verbal / Phone Orders: No Diagnosis Coding ICD-10 Coding Code Description L89.154 Pressure ulcer of sacral region, stage 4 E11.622 Type 2 diabetes mellitus with other skin ulcer M46.28 Osteomyelitis of vertebra, sacral and sacrococcygeal region S31.501A Unspecified open wound of unspecified external genital organs, male, initial encounter G82.21 Paraplegia, complete Follow-up Appointments Return appointment in 1 month. - Dr. Leanord [name] ROOM 8 11/26/2022 1245 extra time 60 minutes *****HOYER***** Other: - Call [facility] [employer] [name] with concerns warmth of the group 3 bed, Bathing/ Shower/ Hygiene May shower and wash wound with soap and water. - Dial gold antibacterial soap ( liquid) Off-Loading Wound #7 Sacrum A fluidized (Group 3) mattress - Continue to use group 3 mattress from [employer] ir Roho cushion for wheelchair Turn and reposition every 2 hours Additional Orders / Instructions Follow Nutritious Diet - increase protein Juven Shake 1-2 times daily. Home Health Wound #7 Sacrum No change in wound care orders this  week; continue Home Health for wound care. May utilize formulary equivalent dressing for wound treatment orders unless otherwise specified. - home health to change weekly. dakin's wet to dry with abd pad to cover. family to change all other days. continue groin mupirocin ointment daily with bordered foam. Apply calcium alignate with bordered foam to right gluteal wound. Other Home Health Orders/Instructions: - [facility] Wound Treatment Wound #7 - Sacrum Cleanser: Wound Cleanser (Home Health) 1 x Per Day/30 Days Discharge Instructions: Cleanse the wound with wound cleanser prior to applying a clean dressing using gauze sponges, not tissue or cotton balls. Peri-Wound Care: Skin Prep (Home Health) 1 x Per Day/30 Days Discharge Instructions: Use skin prep as directed Peri-Wound Care: AandD ointment 1 x Per Day/30 Days Discharge Instructions: apply to any excoriated area. Prim Dressing: Dakin's Solution 0.25%, 16 (oz) 1 x Per Day/30 Days ary Discharge Instructions: Moisten gauze with Dakin's solution Secondary Dressing: ABD Pad, 8x10 1 x Per Day/30 Days Discharge Instructions: Apply over primary dressing as directed. Wound #8 - Groin Cleanser: Vashe 5.8 (oz) (Home Health) 1 x Per Day/30 Days Discharge Instructions: Cleanse the wound with Vashe prior to applying a clean dressing using gauze sponges, not tissue or cotton balls. Cleanser: Wound Cleanser [facility]) 1 x Per Day/30 Days [name], [name] ([id number]) [phone].pdf Page 4 of 8 Discharge Instructions: Cleanse the wound with wound cleanser prior to applying a clean dressing using gauze sponges, not tissue or cotton balls. Peri-Wound Care: AandD ointment 1 x Per Day/30 Days Discharge Instructions: apply to any excoriated area. Topical: Mupirocin Ointment 1 x Per Day/30 Days Discharge Instructions: Apply Mupirocin (Bactroban) as instructed Secondary Dressing: Zetuvit Plus Silicone Border Dressing  4x4 (in/in) (Home Health) 1 x Per Day/30 Days Discharge Instructions: Apply silicone border over primary dressing as directed. Wound #9 - Gluteus Wound Laterality: Right Cleanser: Vashe 5.8 (oz) (Home Health) 1 x Per Day/30 Days Discharge Instructions: Cleanse the wound with Vashe prior to applying a clean dressing using gauze sponges, not tissue or cotton balls. Cleanser: Wound Cleanser (Home Health) 1 x Per Day/30 Days Discharge Instructions: Cleanse the wound with wound cleanser prior to applying a clean dressing using gauze sponges, not tissue or cotton balls. Peri-Wound Care: Skin Prep (Home Health) 1 x Per Day/30 Days Discharge

## 2022-11-01 NOTE — Progress Notes (Addendum)
TEMILOLUWA, LAREDO ([id number]) [id number] Page 1 of 14 Visit Report for 10/29/2022 Arrival Information Details Patient Name: Date of Service: [name] 10/29/2022 12:30 PM Medical Record Number: [id number] Patient Account Number: [id number] Date of Birth/Sex: Treating RN: Mar 06, 1965 (57 y.o. [name] Primary Care [name]: [name], [name] Other Clinician: Referring [name]: Treating [name], [name] [name] in Treatment: 13 Visit Information History Since Last Visit Added or deleted any medications: No Patient Arrived: Wheel Chair Any new allergies or adverse reactions: No Arrival Time: 12:44 Had a fall or experienced change in No Accompanied By: wife activities of daily living that may affect Transfer Assistance: [name] Lift risk of falls: Patient Identification Verified: Yes Signs or symptoms of abuse/neglect since last visito No Secondary Verification Process Completed: Yes Hospitalized since last visit: No Patient Requires Transmission-Based Precautions: No Implantable device outside of the clinic excluding No Patient Has Alerts: No cellular tissue based products placed in the center since last visit: Has Dressing in Place as Prescribed: Yes Pain Present Now: No Electronic Signature([name]: 10/29/2022 5:50:35 PM By: Harry Stall RN, BSN Entered By: [name] on 10/29/2022 12:44:47 -------------------------------------------------------------------------------- Clinic Level of Care Assessment Details Patient Name: Date of Service: [name] 10/29/2022 12:30 PM Medical Record Number: [id number] Patient Account Number: [id number] Date of Birth/Sex: Treating RN: 04/21/1965 (57 y.o. [name] Primary Care [name]: [name], [name] Other Clinician: Referring Hampton Wixom: Treating [name]/Extender: [name] [name], [name] [name] in Treatment: 13 Clinic Level of Care  Assessment Items TOOL 4 Quantity Score X- 1 0 Use when only an EandM is performed on FOLLOW-UP visit ASSESSMENTS - Nursing Assessment / Reassessment X- 1 10 Reassessment of Co-morbidities (includes updates in patient status) X- 1 5 Reassessment of Adherence to Treatment Plan ASSESSMENTS - Wound and Skin A ssessment / Reassessment []  - 0 Simple Wound Assessment / Reassessment - one wound X- 3 5 Complex Wound Assessment / Reassessment - multiple wounds X- 1 10 Dermatologic / Skin Assessment (not related to wound area) ASSESSMENTS - Focused Assessment []  - 0 Circumferential Edema Measurements - multi extremities []  - 0 Nutritional Assessment / Counseling / Intervention [name], [name] ([id number]) [id number] Page 2 of 14 []  - 0 Lower Extremity Assessment (monofilament, tuning fork, pulses) []  - 0 Peripheral Arterial Disease Assessment (using hand held doppler) ASSESSMENTS - Ostomy and/or Continence Assessment and Care []  - 0 Incontinence Assessment and Management []  - 0 Ostomy Care Assessment and Management (repouching, etc.) PROCESS - Coordination of Care []  - 0 Simple Patient / Family Education for ongoing care X- 1 20 Complex (extensive) Patient / Family Education for ongoing care X- 1 10 Staff obtains Chiropractor, Records, T Results / Process Orders est X- 1 10 Staff telephones HHA, Nursing Homes / Clarify orders / etc []  - 0 Routine Transfer to another Facility (non-emergent condition) []  - 0 Routine Hospital Admission (non-emergent condition) []  - 0 New Admissions / Manufacturing engineer / Ordering NPWT Apligraf, etc. , []  - 0 Emergency Hospital Admission (emergent condition) []  - 0 Simple Discharge Coordination X- 1 15 Complex (extensive) Discharge Coordination PROCESS - Special Needs []  - 0 Pediatric / Minor Patient Management []  - 0 Isolation Patient Management []  - 0 Hearing / Language / Visual special needs []  -  0 Assessment of Community assistance (transportation, D/C planning, etc.) []  - 0 Additional assistance / Altered mentation []  - 0 Support Surface([name]) Assessment (bed, cushion, seat, etc.) INTERVENTIONS - Wound Cleansing / Measurement []  -  0 Simple Wound Cleansing - one wound X- 3 5 Complex Wound Cleansing - multiple wounds X- 1 5 Wound Imaging (photographs - any number of wounds) []  - 0 Wound Tracing (instead of photographs) []  - 0 Simple Wound Measurement - one wound X- 3 5 Complex Wound Measurement - multiple wounds INTERVENTIONS - Wound Dressings X - Small Wound Dressing one or multiple wounds 2 10 X- 1 15 Medium Wound Dressing one or multiple wounds []  - 0 Large Wound Dressing one or multiple wounds []  - 0 Application of Medications - topical []  - 0 Application of Medications - injection INTERVENTIONS - Miscellaneous []  - 0 External ear exam []  - 0 Specimen Collection (cultures, biopsies, blood, body fluids, etc.) []  - 0 Specimen([name]) / Culture([name]) sent or taken to Lab for analysis []  - 0 Patient Transfer (multiple staff / Nurse, adult / Similar devices) []  - 0 Simple Staple / Suture removal (25 or less) []  - 0 Complex Staple / Suture removal (26 or more) []  - 0 Hypo / Hyperglycemic Management (close monitor of Blood Glucose) [name], [name] ([id number]) [id number] Page 3 of 14 []  - 0 Ankle / Brachial Index (ABI) - do not check if billed separately X- 1 5 Vital Signs Has the patient been seen at the hospital within the last three years: Yes Total Score: 170 Level Of Care: New/Established - Level 5 Electronic Signature([name]: 10/29/2022 5:50:35 PM By: Harry Stall RN, BSN Entered By: [name] on 10/29/2022 13:16:38 -------------------------------------------------------------------------------- Complex / Palliative Patient Assessment Details Patient Name: Date of Service: [name], [name] 10/29/2022 12:30 PM Medical Record  Number: [id number] Patient Account Number: [id number] Date of Birth/Sex: Treating RN: 1965/09/27 (57 y.o. [name] Primary Care [name]: [name], [name] Other Clinician: Referring Marjo Grosvenor: Treating Daena Alper/Extender: [name] [name], [name] [name] in Treatment: 13 Complex Wound Management Criteria Patient has remarkable or complex co-morbidities requiring medications or treatments that extend wound healing times. Examples: Diabetes mellitus with chronic renal failure or end stage renal disease requiring dialysis Advanced or poorly controlled rheumatoid arthritis Diabetes mellitus and end stage chronic obstructive pulmonary disease Active cancer with current chemo- or radiation therapy hypotension, paraplegia-spinal cord injury T1-T6, osteomyelitis, DM type II, neurogenic bowel Palliative Wound Management Criteria Care Approach Wound Care Plan: Complex Wound Management Electronic Signature([name]: 11/11/2022 4:14:59 PM By: Harry Stall RN, BSN Signed: 11/12/2022 4:26:04 PM By: Baltazar Najjar MD Entered By: [name] on 11/11/2022 16:14:58 -------------------------------------------------------------------------------- Encounter Discharge Information Details Patient Name: Date of Service: [name], [name] 10/29/2022 12:30 PM Medical Record Number: [id number] Patient Account Number: [id number] Date of Birth/Sex: Treating RN: [birth year] (57 y.o. [name] Primary Care Jahnasia Tatum: [name], [name] Other Clinician: Referring Winnell Bento: Treating Naiomy Watters/Extender: [name] [name], [name] [name] in Treatment: 62 Encounter Discharge Information Items Discharge Condition: Stable Ambulatory Status: Wheelchair Discharge Destination: Home Transportation: Private Auto Accompanied By: self Schedule Follow-up Appointment: Yes Clinical Summary of Care: Electronic Signature([name]: 10/29/2022 5:50:35 PM By: Harry Stall RN, BSN [place], [name]  ([id number]) PM By: Harry Stall RN, BSN [phone] Page 4 of 14 Signed: 10/29/2022 5:50:35 Entered By: [name] on 10/29/2022 13:17:27 -------------------------------------------------------------------------------- Lower Extremity Assessment Details Patient Name: Date of Service: [name] 10/29/2022 12:30 PM Medical Record Number: [id number] Patient Account Number: [id number] Date of Birth/Sex: Treating RN: 06/27/1965 (57 y.o. [name] Primary Care Terrilyn Tyner: [name], [name] Other Clinician: Referring Rosiland Sen: Treating Ciana Simmon/Extender: [name] [name], [name] [name] in Treatment: [phone] Electronic  Signature([name]: 10/29/2022 5:50:35 PM By: Harry Stall RN, BSN Entered By: [name] on 10/29/2022 12:45:10 -------------------------------------------------------------------------------- Multi Wound Chart Details Patient Name: Date of Service: [name], [name] 10/29/2022 12:30 PM Medical Record Number: [id number] Patient Account Number: [id number] Date of Birth/Sex: Treating RN: 1965/10/03 (57 y.o. M) Primary Care Geraldy Akridge: [name], [name] Other Clinician: Referring Neilson Oehlert: Treating Natalina Wieting/Extender: [name] [name], [name] [name] in Treatment: 13 Vital Signs Height(in): Pulse(bpm): 76 Weight(lbs): Blood Pressure(mmHg): 88/60 Body Mass Index(BMI): Temperature(F): 97.8 Respiratory Rate(breaths/min): 20 [7:Photos:] Sacrum Groin Right Gluteus Wound Location: Pressure Injury Bump Skin Tear/Laceration Wounding Event: Pressure Ulcer Skin Tear Skin Tear Primary Etiology: Hypotension, Type II Diabetes, Hypotension, Type II Diabetes, Hypotension, Type II Diabetes, Comorbid History: Osteomyelitis, Paraplegia Osteomyelitis, Paraplegia Osteomyelitis, Paraplegia 07/12/2021 10/08/2022 10/24/2022 Date Acquired: 13 3 0 [name] of Treatment: Open Open Open Wound Status: No No No Wound Recurrence: 6x2.5x4.8  0.2x0.2x0.1 2x2.8x0.1 Measurements L x W x D (cm) 11.781 0.031 4.398 A (cm) : rea 56.549 0.003 0.44 Volume (cm) : 83.20% 80.30% N/A % Reduction in A rea: 75.60% 97.30% N/A % Reduction in Volume: 12 Starting Position 1 (o'clock): 12 Ending Position 1 (o'clock): 4.8 Maximum Distance 1 (cm): [name], LOUGHMILLER ([id number]) [id number] Page 5 of 14 Yes No N/A Undermining: Category/Stage IV Full Thickness Without Exposed Full Thickness Without Exposed Classification: Support Structures Support Structures Large Medium Medium Exudate Amount: Serosanguineous Serosanguineous Serosanguineous Exudate Type: red, brown red, brown red, brown Exudate Color: Distinct, outline attached Epibole N/A Wound Margin: Medium (34-66%) Large (67-100%) Large (67-100%) Granulation Amount: Friable Pink N/A Granulation Quality: Medium (34-66%) None Present (0%) None Present (0%) Necrotic Amount: Fat Layer (Subcutaneous Tissue): Yes Fat Layer (Subcutaneous Tissue): Yes Fascia: No Exposed Structures: Muscle: Yes Fascia: No Fat Layer (Subcutaneous Tissue): No Bone: Yes Tendon: No Tendon: No Fascia: No Muscle: No Muscle: No Tendon: No Joint: No Joint: No Joint: No Bone: No Bone: No None None N/A Epithelialization: Excoriation: No Excoriation: No Excoriation: No Periwound Skin Texture: Induration: No Induration: No Induration: No Callus: No Callus: No Callus: No Crepitus: No Crepitus: No Crepitus: No Rash: No Rash: No Rash: No Scarring: No Scarring: No Scarring: No Maceration: No Maceration: No Maceration: No Periwound Skin Moisture: Dry/Scaly: No Dry/Scaly: No Dry/Scaly: No Atrophie Blanche: No Atrophie Blanche: No Atrophie Blanche: No Periwound Skin Color: Cyanosis: No Cyanosis: No Cyanosis: No Ecchymosis: No Ecchymosis: No Ecchymosis: No Erythema: No Erythema: No Erythema: No Hemosiderin Staining: No Hemosiderin Staining:  No Hemosiderin Staining: No Mottled: No Mottled: No Mottled: No Pallor: No Pallor: No Pallor: No Rubor: No Rubor: No Rubor: No No Abnormality No Abnormality N/A Temperature: Treatment Notes Wound #7 (Sacrum) Cleanser Wound Cleanser Discharge Instruction: Cleanse the wound with wound cleanser prior to applying a clean dressing using gauze sponges, not tissue or cotton balls. Peri-Wound Care Skin Prep Discharge Instruction: Use skin prep as directed AandD ointment Discharge Instruction: apply to any excoriated area. Topical Primary Dressing Dakin'[name] Solution 0.25%, 16 (oz) Discharge Instruction: Moisten gauze with Dakin'[name] solution Secondary Dressing ABD Pad, 8x10 Discharge Instruction: Apply over primary dressing as directed. Secured With Compression Wrap Compression Stockings Add-Ons Wound #8 (Groin) Cleanser Vashe 5.8 (oz) Discharge Instruction: Cleanse the wound with Vashe prior to applying a clean dressing using gauze sponges, not tissue or cotton balls. Wound Cleanser Discharge Instruction: Cleanse the wound with wound cleanser prior to applying a clean dressing using gauze sponges, not tissue or cotton balls. Peri-Wound Care AandD ointment Discharge Instruction: apply to any excoriated area. Topical  BASIM, BARTNIK ([id number]) [id number] Page 6 of 14 Mupirocin Ointment Discharge Instruction: Apply Mupirocin (Bactroban) as instructed Primary Dressing Secondary Dressing Zetuvit Plus Silicone Border Dressing 4x4 (in/in) Discharge Instruction: Apply silicone border over primary dressing as directed. Secured With Compression Wrap Compression Stockings Add-Ons Wound #9 (Gluteus) Wound Laterality: Right Cleanser Vashe 5.8 (oz) Discharge Instruction: Cleanse the wound with Vashe prior to applying a clean dressing using gauze sponges, not tissue or cotton balls. Wound Cleanser Discharge Instruction: Cleanse the wound with wound cleanser  prior to applying a clean dressing using gauze sponges, not tissue or cotton balls. Peri-Wound Care Skin Prep Discharge Instruction: Use skin prep as directed Aand D ointment Discharge Instruction: apply to any excoriated area. Topical Primary Dressing Maxorb Extra Calcium Alginate, 2x2 (in/in) Discharge Instruction: Apply to wound bed as instructed Secondary Dressing Zetuvit Plus Silicone Border Dressing 4x4 (in/in) Discharge Instruction: Apply silicone border over primary dressing as directed. Secured With Compression Wrap Compression Stockings Facilities manager) Signed: 10/29/2022 4:34:08 PM By: Baltazar Najjar MD Entered By: Baltazar Najjar on 10/29/2022 13:37:27 -------------------------------------------------------------------------------- Multi-Disciplinary Care Plan Details Patient Name: Date of Service: [name], [name] 10/29/2022 12:30 PM Medical Record Number: [id number] Patient Account Number: [id number] Date of Birth/Sex: Treating RN: 1965/10/30 (57 y.o. [name] Primary Care Kaleyah Labreck: [name], [name] Other Clinician: Referring Ahmet Schank: Treating Maddax Palinkas/Extender: [name] [name], [facility] [name] in Treatment: [address] Inactive Pressure BUDDIE, MARSTON ([id number]) [id number] Page 7 of 14 Nursing Diagnoses: Knowledge deficit related to causes and risk factors for pressure ulcer development Knowledge deficit related to management of pressures ulcers Goals: Patient will remain free from development of additional pressure ulcers Date Initiated: 07/25/2022 Target Resolution Date: 11/01/2022 Goal Status: Active Patient will remain free of pressure ulcers Date Initiated: 07/25/2022 Target Resolution Date: 11/01/2022 Goal Status: Active Patient/caregiver will verbalize risk factors for pressure ulcer development Date Initiated: 07/25/2022 Target Resolution Date: 11/01/2022 Goal Status: Active Patient/caregiver  will verbalize understanding of pressure ulcer management Date Initiated: 07/25/2022 Target Resolution Date: 11/01/2022 Goal Status: Active Interventions: Assess: immobility, friction, shearing, incontinence upon admission and as needed Assess offloading mechanisms upon admission and as needed Assess potential for pressure ulcer upon admission and as needed Provide education on pressure ulcers Notes: Wound/Skin Impairment Nursing Diagnoses: Impaired tissue integrity Knowledge deficit related to ulceration/compromised skin integrity Goals: Patient will have a decrease in wound volume by X% from date: (specify in notes) Date Initiated: 07/25/2022 Target Resolution Date: 12/05/2022 Goal Status: Active Patient/caregiver will verbalize understanding of skin care regimen Date Initiated: 07/25/2022 Target Resolution Date: 11/07/2022 Goal Status: Active Ulcer/skin breakdown will have a volume reduction of 30% by week 4 Date Initiated: 07/25/2022 Date Inactivated: 08/23/2022 Target Resolution Date: 08/22/2022 Goal Status: Unmet Unmet Reason: remains the same. Interventions: Assess patient/caregiver ability to obtain necessary supplies Assess patient/caregiver ability to perform ulcer/skin care regimen upon admission and as needed Assess ulceration([name]) every visit Provide education on ulcer and skin care Notes: Negative Pressure Wound Therapy Nursing Diagnoses: Knowledge deficit related to use and safety of the device Goals: Patient/caregiver agrees to and verbalizes understanding of need to use Date Initiated: 09/02/2022 Date Inactivated: 09/26/2022 Target Resolution Date: 09/26/2022 Goal Status: Met Patient/caregiver will verbalize understanding of use of the device Date Initiated: 09/02/2022 Date Inactivated: 09/26/2022 Target Resolution Date: 09/26/2022 Goal Status: Met Nutritional supplements and/or vitamins as prescribed Date Initiated: 09/02/2022 Target Resolution Date:  11/08/2022 Goal Status: Active Interventions: Assess patient nutrition upon admission and as needed per policy Assess patient understanding of disease  process and management Monitor and protect skin around the wound Provide education on nutrition Provide education on use, care, and troubleshooting Treatment Activities: [name], [name] ([id number]) [id number] Page 8 of 14 Education provided on Nutrition : 09/02/2022 Referred to DME Catheline Hixon for dressing supplies : 09/02/2022 Support surface (Group 2/3) in use : 09/02/2022 Turning and Repositioning schedule in place : 09/02/2022 Notes: Electronic Signature([name]: 10/29/2022 5:50:35 PM By: Harry Stall RN, BSN Entered By: [name] on 10/29/2022 13:08:34 -------------------------------------------------------------------------------- Pain Assessment Details Patient Name: Date of Service: [name], [name] 10/29/2022 12:30 PM Medical Record Number: [id number] Patient Account Number: [id number] Date of Birth/Sex: Treating RN: [birth year] (57 y.o. [name] Primary Care Amethyst Gainer: [name], [name] Other Clinician: Referring Veronika Heard: Treating Shaney Deckman/Extender: [name] [name], [name] [name] in Treatment: 13 Active Problems Location of Pain Severity and Description of Pain Patient Has Paino No Site Locations Pain Management and Medication Current Pain Management: Electronic Signature([name]: 10/29/2022 5:50:35 PM By: Harry Stall RN, BSN Entered By: [name] on 10/29/2022 12:45:05 -------------------------------------------------------------------------------- Patient/Caregiver Education Details Patient Name: Date of Service: [name] 9/17/2024andnbsp12:30 PM Medical Record Number: [id number] Patient Account Number: [id number] Date of Birth/Gender: Treating RN: 1965-05-02 (57 y.o. [name] Primary Care Physician: Kalman Shan Other Clinician: Referring  Physician: Treating Physician/Extender: Enos Fling [place], [name] ([id number]) [id number] Page 9 of 14 [name] in Treatment: 13 Education Assessment Education Provided To: Patient Education Topics Provided Wound/Skin Impairment: Handouts: Caring for Your Ulcer Methods: Explain/Verbal Responses: Reinforcements needed Electronic Signature([name]: 10/29/2022 5:50:35 PM By: Harry Stall RN, BSN Entered By: [name] on 10/29/2022 13:08:50 -------------------------------------------------------------------------------- Wound Assessment Details Patient Name: Date of Service: [name], [name] 10/29/2022 12:30 PM Medical Record Number: [id number] Patient Account Number: [id number] Date of Birth/Sex: Treating RN: [birth year] (57 y.o. M) Primary Care Shyvonne Chastang: [name], [name] Other Clinician: Referring Bruin Bolger: Treating Tawnia Schirm/Extender: [name] [name], [name] [name] in Treatment: 13 Wound Status Wound Number: 7 Primary Etiology: Pressure Ulcer Wound Location: Sacrum Wound Status: Open Wounding Event: Pressure Injury Comorbid History: Hypotension, Type II Diabetes, Osteomyelitis, Paraplegia Date Acquired: 07/12/2021 [name] Of Treatment: 13 Clustered Wound: No Photos Wound Measurements Length: (cm) 6 Width: (cm) 2.5 Depth: (cm) 4.8 Area: (cm) 11.781 Volume: (cm) 56.549 % Reduction in Area: 83.2% % Reduction in Volume: 75.6% Epithelialization: None Undermining: Yes Starting Position (o'clock): 12 Ending Position (o'clock): 12 Maximum Distance: (cm) 4.8 Wound Description Classification: Category/Stage IV Wound Margin: Distinct, outline attached Exudate Amount: Large Exudate Type: Serosanguineous [name], [name] ([id number]) Exudate Color: red, brown Foul Odor After Cleansing: No Slough/Fibrino Yes [id number].pdf Page 10 of 14 Wound Bed Granulation Amount: Medium (34-66%) Exposed  Structure Granulation Quality: Friable Fascia Exposed: No Necrotic Amount: Medium (34-66%) Fat Layer (Subcutaneous Tissue) Exposed: Yes Necrotic Quality: Adherent Slough Tendon Exposed: No Muscle Exposed: Yes Necrosis of Muscle: No Joint Exposed: No Bone Exposed: Yes Periwound Skin Texture Texture Color No Abnormalities Noted: No No Abnormalities Noted: No Callus: No Atrophie Blanche: No Crepitus: No Cyanosis: No Excoriation: No Ecchymosis: No Induration: No Erythema: No Rash: No Hemosiderin Staining: No Scarring: No Mottled: No Pallor: No Moisture Rubor: No No Abnormalities Noted: No Dry / Scaly: No Temperature / Pain Maceration: No Temperature: No Abnormality Treatment Notes Wound #7 (Sacrum) Cleanser Wound Cleanser Discharge Instruction: Cleanse the wound with wound cleanser prior to applying a clean dressing using gauze sponges, not tissue or cotton balls. Peri-Wound Care Skin Prep Discharge Instruction: Use skin prep as directed  AandD ointment Discharge Instruction: apply to any excoriated area. Topical Primary Dressing Dakin'[name] Solution 0.25%, 16 (oz) Discharge Instruction: Moisten gauze with Dakin'[name] solution Secondary Dressing ABD Pad, 8x10 Discharge Instruction: Apply over primary dressing as directed. Secured With Compression Wrap Compression Stockings Facilities manager) Signed: 11/01/2022 12:48:07 PM By: Thayer Dallas Entered By: Thayer Dallas on 10/29/2022 13:00:33 -------------------------------------------------------------------------------- Wound Assessment Details Patient Name: Date of Service: [name], [name] 10/29/2022 12:30 PM Medical Record Number: [id number] Patient Account Number: [id number] Date of Birth/Sex: Treating RN: 1965-04-25 (57 y.o. [name], [name] ([id number]) [id number] Page 11 of 14 Primary Care Karmel Patricelli: [name], [place] Other Clinician: Referring Ragena Fiola: Treating  Ioannis Schuh/Extender: [name] [name], [name] [name] in Treatment: 13 Wound Status Wound Number: 8 Primary Etiology: Skin Tear Wound Location: Groin Wound Status: Open Wounding Event: Bump Comorbid History: Hypotension, Type II Diabetes, Osteomyelitis, Paraplegia Date Acquired: 10/08/2022 [name] Of Treatment: 3 Clustered Wound: No Photos Wound Measurements Length: (cm) 0.2 Width: (cm) 0.2 Depth: (cm) 0.1 Area: (cm) 0.031 Volume: (cm) 0.003 % Reduction in Area: 80.3% % Reduction in Volume: 97.3% Epithelialization: None Tunneling: No Undermining: No Wound Description Classification: Full Thickness Without Exposed Support Structures Wound Margin: Epibole Exudate Amount: Medium Exudate Type: Serosanguineous Exudate Color: red, brown Foul Odor After Cleansing: No Slough/Fibrino No Wound Bed Granulation Amount: Large (67-100%) Exposed Structure Granulation Quality: Pink Fascia Exposed: No Necrotic Amount: None Present (0%) Fat Layer (Subcutaneous Tissue) Exposed: Yes Tendon Exposed: No Muscle Exposed: No Joint Exposed: No Bone Exposed: No Periwound Skin Texture Texture Color No Abnormalities Noted: No No Abnormalities Noted: No Callus: No Atrophie Blanche: No Crepitus: No Cyanosis: No Excoriation: No Ecchymosis: No Induration: No Erythema: No Rash: No Hemosiderin Staining: No Scarring: No Mottled: No Pallor: No Moisture Rubor: No No Abnormalities Noted: No Dry / Scaly: No Temperature / Pain Maceration: No Temperature: No Abnormality Treatment Notes Wound #8 (Groin) Cleanser Vashe 5.8 (oz) Discharge Instruction: Cleanse the wound with Vashe prior to applying a clean dressing using gauze sponges, not tissue or cotton balls. Wound Cleanser Discharge Instruction: Cleanse the wound with wound cleanser prior to applying a clean dressing using gauze sponges, not tissue or cotton balls. [name], [name] ([id number]) [id number]  Page 12 of 14 Peri-Wound Care AandD ointment Discharge Instruction: apply to any excoriated area. Topical Mupirocin Ointment Discharge Instruction: Apply Mupirocin (Bactroban) as instructed Primary Dressing Secondary Dressing Zetuvit Plus Silicone Border Dressing 4x4 (in/in) Discharge Instruction: Apply silicone border over primary dressing as directed. Secured With Compression Wrap Compression Stockings Facilities manager) Signed: 11/01/2022 12:48:07 PM By: Thayer Dallas Entered By: Thayer Dallas on 10/29/2022 13:01:12 -------------------------------------------------------------------------------- Wound Assessment Details Patient Name: Date of Service: [name], [name] 10/29/2022 12:30 PM Medical Record Number: [id number] Patient Account Number: [id number] Date of Birth/Sex: Treating RN: [birth year] (57 y.o. M) Primary Care Kharlie Bring: [name], [name] Other Clinician: Referring Montrey Buist: Treating Pacen Watford/Extender: [name] [name], [name] [name] in Treatment: 13 Wound Status Wound Number: 9 Primary Etiology: Skin Tear Wound Location: Right Gluteus Wound Status: Open Wounding Event: Skin Tear/Laceration Comorbid History: Hypotension, Type II Diabetes, Osteomyelitis, Paraplegia Date Acquired: 10/24/2022 [name] Of Treatment: 0 Clustered Wound: No Photos Wound Measurements Length: (cm) Width: (cm) Depth: (cm) Area: (cm) Volume: (cm) 2 % Reduction in Area: 2.8 % Reduction in Volume: 0.1 4.398 0.44 Wound Description Classification: Full Thickness Without Exposed Support Structures Exudate Amount: Medium [name], [name] ([id number]) Exudate Type: Serosanguineous Exudate Color: red, brown Foul Odor After Cleansing: No Slough/Fibrino No [id number] Page 13 of  14 Wound Bed Granulation Amount: Large (67-100%) Exposed Structure Necrotic Amount: None Present (0%) Fascia Exposed: No Fat Layer (Subcutaneous Tissue) Exposed:  No Tendon Exposed: No Muscle Exposed: No Joint Exposed: No Bone Exposed: No Periwound Skin Texture Texture Color No Abnormalities Noted: No No Abnormalities Noted: No Callus: No Atrophie Blanche: No Crepitus: No Cyanosis: No Excoriation: No Ecchymosis: No Induration: No Erythema: No Rash: No Hemosiderin Staining: No Scarring: No Mottled: No Pallor: No Moisture Rubor: No No Abnormalities Noted: No Dry / Scaly: No Maceration: No Treatment Notes Wound #9 (Gluteus) Wound Laterality: Right Cleanser Vashe 5.8 (oz) Discharge Instruction: Cleanse the wound with Vashe prior to applying a clean dressing using gauze sponges, not tissue or cotton balls. Wound Cleanser Discharge Instruction: Cleanse the wound with wound cleanser prior to applying a clean dressing using gauze sponges, not tissue or cotton balls. Peri-Wound Care Skin Prep Discharge Instruction: Use skin prep as directed Aand D ointment Discharge Instruction: apply to any excoriated area. Topical Primary Dressing Maxorb Extra Calcium Alginate, 2x2 (in/in) Discharge Instruction: Apply to wound bed as instructed Secondary Dressing Zetuvit Plus Silicone Border Dressing 4x4 (in/in) Discharge Instruction: Apply silicone border over primary dressing as directed. Secured With Compression Wrap Compression Stockings Add-Ons Electronic Signature([name]: 11/01/2022 12:48:07 PM By: Thayer Dallas Entered By: Thayer Dallas on 10/29/2022 12:59:59 -------------------------------------------------------------------------------- Vitals Details Patient Name: Date of Service: [name], [name] 10/29/2022 12:30 PM [name] ([id number]) [id number] Page 14 of 14 Medical Record Number: [id number] Patient Account Number: [id number] Date of Birth/Sex: Treating RN: 10-29-1965 (57 y.o. [name], [name] Primary Care Shantice Menger: [name], [name] Other Clinician: Referring Verneda Hollopeter: Treating  Azlin Zilberman/Extender: [name] [name], [name] [name] in Treatment: 13 Vital Signs Time Taken: 12:44 Temperature (F): 97.8 Pulse (bpm): 76 Respiratory Rate (breaths/min): 20 Blood Pressure (mmHg): 88/60 Reference Range: 80 - 120 mg / dl Electronic Signature([name]: 10/29/2022 5:50:35 PM By: Harry Stall RN, BSN Entered By: [name] on 10/29/2022 12:45:30

## 2022-11-05 ENCOUNTER — Other Ambulatory Visit: Payer: Self-pay | Admitting: Physical Medicine and Rehabilitation

## 2022-11-08 ENCOUNTER — Encounter: Payer: Self-pay | Admitting: Physical Medicine and Rehabilitation

## 2022-11-08 ENCOUNTER — Encounter: Payer: 59 | Attending: Physical Medicine and Rehabilitation | Admitting: Physical Medicine and Rehabilitation

## 2022-11-08 VITALS — BP 112/73 | HR 72 | Ht 67.0 in

## 2022-11-08 DIAGNOSIS — L89156 Pressure-induced deep tissue damage of sacral region: Secondary | ICD-10-CM | POA: Insufficient documentation

## 2022-11-08 DIAGNOSIS — M8610 Other acute osteomyelitis, unspecified site: Secondary | ICD-10-CM | POA: Diagnosis present

## 2022-11-08 DIAGNOSIS — G8921 Chronic pain due to trauma: Secondary | ICD-10-CM | POA: Diagnosis present

## 2022-11-08 DIAGNOSIS — Z993 Dependence on wheelchair: Secondary | ICD-10-CM | POA: Insufficient documentation

## 2022-11-08 DIAGNOSIS — G8250 Quadriplegia, unspecified: Secondary | ICD-10-CM | POA: Diagnosis present

## 2022-11-08 DIAGNOSIS — M7918 Myalgia, other site: Secondary | ICD-10-CM | POA: Diagnosis not present

## 2022-11-08 MED ORDER — LIDOCAINE HCL 1 % IJ SOLN
6.0000 mL | Freq: Once | INTRAMUSCULAR | Status: AC
Start: 2022-11-08 — End: 2022-11-08
  Administered 2022-11-08: 6 mL

## 2022-11-08 MED ORDER — BACLOFEN 10 MG PO TABS: ORAL_TABLET | ORAL | 1 refills | Status: DC

## 2022-11-08 NOTE — Progress Notes (Signed)
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   Was in hospital last month-  had UTI- went to kidneys- was in hospital for 4 days- done with ABX- went home with Oral ABX- for 7 more days.   Still has pressure ulcer- stable to slightly better?- so it's a Stage 4 pressure relief.    Got a little better, but very slightly.  Had an air bed- to sand bed- >1 month ago- bed was too warm- VAC and pt too warm-   and VAC- but off that now- since wasn't sticking correctly.   Tired of being on sides.     More time in bed- lately, not in w/c much- Says is doing pressure relief-    Sometimes, a few times, feels like pinched like needle - concerned hurting spine- pain in neck Not on fire.   Transferring with hoyer lift.  But sometimes drags across bed to get in place.    Going to [place] in [place]-     Plan: Needs to do pressure relief every 15 minutes when in wheelchair- every 2 hours when in bed. - try to stay off butt unless sitting up eating in bed.  (Of note, didn't do for 1st 30 minutes! Until insisted he do.)   2. In bed, prop 2 pillows underneath him to keep him at a mild angle.  That could reduce stress of being on side completely.  Don't lay on back- and only sit when eating and going to doctor's appointments.    3. I don't think pain in neck is from pinching actual spinal cord, but form pinching muscles, which then can pinch the nerves outside the spinal cord.   4. Be careful transferring- don't let sling slide across butt to much- Don't drag across bed- Can cause new tissue to not stay on wound so can heal faster.    5. Don't go to [place] until wound  better healed-    6. Take picture of wound every 1 weeks for me- and will see him back in 6 weeks, before [place] trip, and see if healed enough. But I think trip is VERY unlikely.    7. Went over that sitting is 30 mm Hg/[place]- and pressure to keep blood vessels open is 21 mm Hg- therefore, sitting doesn't allow ANY blood flow to butt, so that's why not healing. -   8. Patient here for trigger point injections for  Consent done and on chart.  Cleaned areas with alcohol and injected using a 27 gauge 1.5 inch needle  Injected  6cc- none wasted Using 1% Lidocaine with no EPI  Upper traps B/L x2 Levators B/L Posterior scalenes B/L  Middle scalenes- B/L  Splenius Capitus- b?L  Pectoralis Major- b/L  Rhomboids B/L x2 Infraspinatus Teres Major/minor Thoracic paraspinals Lumbar paraspinals Other injections-  b/L deltoids.    Patient's level of pain prior was 7/10 Current level of pain after injections is 3/10- much more relaxed  There was no bleeding or complications.  Patient was advised to drink a lot of water on day after injections to flush system Will have increased soreness for 12-48 hours after injections.  Can use Lidocaine patches the day AFTER injections Can use theracane on day of injections in places didn't inject Can use heating pad 4-6 hours AFTER injections    9. F/U in 6 weeks- maybe trP injections- bring pictures of wound weekly.   10. Con't gabapentin and baclofen- needs refill of Baclofen-not gabapentin - doesn't need refills of Prazac/-has enough- con't all meds   I spent a total of 42   minutes on total care today- >50% coordination of care- due to  triggewr point injections- 10 minutes; then discussion of wounds; pressure releif; refills of meds and education on why wound not healing- as documented above.

## 2022-11-08 NOTE — Patient Instructions (Addendum)
Plan: Needs to do pressure relief every 15 minutes when in wheelchair- every 2 hours when in bed. - try to stay off butt unless sitting up eating in bed.  (Of note, didn't do for 1st 30 minutes! Until insisted he do.)   2. In bed, prop 2 pillows underneath him to keep him at a mild angle.  That could reduce stress of being on side completely.  Don't lay on back- and only sit when eating and going to doctor's appointments.    3. I don't think pain in neck is from pinching actual spinal cord, but form pinching muscles, which then can pinch the nerves outside the spinal cord.   4. Be careful transferring- don't let sling slide across butt to much- Don't drag across bed- Can cause new tissue to not stay on wound so can heal faster.    5. Don't go to [place] until wound better healed-    6. Take picture of wound every 1 weeks for me- and will see him back in 6 weeks, before [place] trip, and see if healed enough. But I think trip is VERY unlikely.    7. Went over that sitting is 30 mm Hg/[place]- and pressure to keep blood vessels open is 21 mm Hg- therefore, sitting doesn't allow ANY blood flow to butt, so that's why not healing. -   8. Patient here for trigger point injections for  Consent done and on chart.  Cleaned areas with alcohol and injected using a 27 gauge 1.5 inch needle  Injected  6cc- none wasted Using 1% Lidocaine with no EPI  Upper traps B/L x2 Levators B/L Posterior scalenes B/L  Middle scalenes- B/L  Splenius Capitus- b?L  Pectoralis Major- b/L  Rhomboids B/L x2 Infraspinatus Teres Major/minor Thoracic paraspinals Lumbar paraspinals Other injections-  b/L deltoids.    Patient's level of pain prior was 7/10 Current level of pain after injections is 3/10- much more relaxed  There was no bleeding or complications.  Patient was advised to drink a lot of water on day after injections to flush system Will have increased soreness for 12-48 hours after injections.   Can use Lidocaine patches the day AFTER injections Can use theracane on day of injections in places didn't inject Can use heating pad 4-6 hours AFTER injections    9. F/U in 6 weeks- maybe trP injections- bring pictures of wound weekly.    10. Refilled Baclofen-  doesn't need refill of Gabapentin or Prozac

## 2022-11-26 ENCOUNTER — Encounter (HOSPITAL_BASED_OUTPATIENT_CLINIC_OR_DEPARTMENT_OTHER): Payer: 59 | Attending: Internal Medicine | Admitting: Internal Medicine

## 2022-11-26 DIAGNOSIS — G8221 Paraplegia, complete: Secondary | ICD-10-CM | POA: Insufficient documentation

## 2022-11-26 DIAGNOSIS — Z5971 Insufficient health insurance coverage: Secondary | ICD-10-CM | POA: Diagnosis not present

## 2022-11-26 DIAGNOSIS — Z794 Long term (current) use of insulin: Secondary | ICD-10-CM | POA: Insufficient documentation

## 2022-11-26 DIAGNOSIS — E11622 Type 2 diabetes mellitus with other skin ulcer: Secondary | ICD-10-CM | POA: Insufficient documentation

## 2022-11-26 DIAGNOSIS — M4628 Osteomyelitis of vertebra, sacral and sacrococcygeal region: Secondary | ICD-10-CM | POA: Insufficient documentation

## 2022-11-26 DIAGNOSIS — L89154 Pressure ulcer of sacral region, stage 4: Secondary | ICD-10-CM | POA: Insufficient documentation

## 2022-11-26 DIAGNOSIS — L89312 Pressure ulcer of right buttock, stage 2: Secondary | ICD-10-CM | POA: Diagnosis not present

## 2022-11-26 DIAGNOSIS — Z7901 Long term (current) use of anticoagulants: Secondary | ICD-10-CM | POA: Insufficient documentation
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
## 2022-11-27 NOTE — Progress Notes (Signed)
Harry, [name] ([id number]) [id number] Page 1 of 12 Visit Report for 11/26/2022 Arrival Information Details Patient Name: Date of Service: [name] 11/26/2022 12:45 PM Medical Record Number: [id number] Patient Account Number: [id number] Date of Birth/Sex: Treating RN: [birth year] (57 y.o. [name] Primary Care [name]: Gayleen Orem, [id number] Other Clinician: Referring Alayssa Flinchum: Treating Zuleyma Scharf/Extender: Ernst Breach WN, [name] Weeks in Treatment: 17 Visit Information History Since Last Visit All ordered tests and consults were completed: Yes Patient Arrived: Ambulatory Added or deleted any medications: No Arrival Time: 13:05 Any new allergies or adverse reactions: No Accompanied By: family Had a fall or experienced change in No Transfer Assistance: Nurse, adult activities of daily living that may affect Patient Identification Verified: Yes risk of falls: Secondary Verification Process Completed: Yes Signs or symptoms of abuse/neglect since last visito No Patient Requires Transmission-Based Precautions: No Hospitalized since last visit: No Patient Has Alerts: No Implantable device outside of the clinic excluding No cellular tissue based products placed in the center since last visit: Has Dressing in Place as Prescribed: Yes Pain Present Now: No Electronic Signature([name]: 11/27/2022 4:07:14 PM By: Brenton Grills Entered By: Brenton Grills on 11/26/2022 10:06:06 -------------------------------------------------------------------------------- Clinic Level of Care Assessment Details Patient Name: Date of Service: [name] 11/26/2022 12:45 PM Medical Record Number: [id number] Patient Account Number: [id number] Date of Birth/Sex: Treating RN: January 15, 1966 (57 y.o. [name] Primary Care Delaine Canter: Gayleen Orem, Zena Amos Other Clinician: Referring Roise Emert: Treating Nayali Talerico/Extender: Ernst Breach WN,  [name] Weeks in Treatment: 17 Clinic Level of Care Assessment Items TOOL 4 Quantity Score X- 1 0 Use when only an EandM is performed on FOLLOW-UP visit ASSESSMENTS - Nursing Assessment / Reassessment X- 1 10 Reassessment of Co-morbidities (includes updates in patient status) X- 1 5 Reassessment of Adherence to Treatment Plan ASSESSMENTS - Wound and Skin A ssessment / Reassessment []  - 0 Simple Wound Assessment / Reassessment - one wound X- 1 5 Complex Wound Assessment / Reassessment - multiple wounds []  - 0 Dermatologic / Skin Assessment (not related to wound area) ASSESSMENTS - Focused Assessment []  - 0 Circumferential Edema Measurements - multi extremities []  - 0 Nutritional Assessment / Counseling / Intervention [name], [name] ([id number]) [id number] Page 2 of 12 []  - 0 Lower Extremity Assessment (monofilament, tuning fork, pulses) []  - 0 Peripheral Arterial Disease Assessment (using hand held doppler) ASSESSMENTS - Ostomy and/or Continence Assessment and Care []  - 0 Incontinence Assessment and Management []  - 0 Ostomy Care Assessment and Management (repouching, etc.) PROCESS - Coordination of Care []  - 0 Simple Patient / Family Education for ongoing care X- 1 20 Complex (extensive) Patient / Family Education for ongoing care X- 1 10 Staff obtains Chiropractor, Records, T Results / Process Orders est X- 1 10 Staff telephones HHA, Nursing Homes / Clarify orders / etc []  - 0 Routine Transfer to another Facility (non-emergent condition) []  - 0 Routine Hospital Admission (non-emergent condition) []  - 0 New Admissions / Manufacturing engineer / Ordering NPWT Apligraf, etc. , []  - 0 Emergency Hospital Admission (emergent condition) []  - 0 Simple Discharge Coordination X- 1 15 Complex (extensive) Discharge Coordination PROCESS - Special Needs []  - 0 Pediatric / Minor Patient Management []  - 0 Isolation Patient Management []  -  0 Hearing / Language / Visual special needs []  - 0 Assessment of Community assistance (transportation, D/C planning, etc.) []  - 0 Additional assistance / Altered mentation []  - 0 Support Surface([name]) Assessment (bed, cushion, seat, etc.) INTERVENTIONS -  Wound Cleansing / Measurement []  - 0 Simple Wound Cleansing - one wound X- 1 5 Complex Wound Cleansing - multiple wounds X- 1 5 Wound Imaging (photographs - any number of wounds) []  - 0 Wound Tracing (instead of photographs) []  - 0 Simple Wound Measurement - one wound X- 1 5 Complex Wound Measurement - multiple wounds INTERVENTIONS - Wound Dressings []  - 0 Small Wound Dressing one or multiple wounds X- 1 15 Medium Wound Dressing one or multiple wounds []  - 0 Large Wound Dressing one or multiple wounds []  - 0 Application of Medications - topical []  - 0 Application of Medications - injection INTERVENTIONS - Miscellaneous []  - 0 External ear exam []  - 0 Specimen Collection (cultures, biopsies, blood, body fluids, etc.) []  - 0 Specimen([name]) / Culture([name]) sent or taken to Lab for analysis []  - 0 Patient Transfer (multiple staff / Nurse, adult / Similar devices) []  - 0 Simple Staple / Suture removal (25 or less) []  - 0 Complex Staple / Suture removal (26 or more) []  - 0 Hypo / Hyperglycemic Management (close monitor of Blood Glucose) [name], [name] ([id number]) [id number] Page 3 of 12 []  - 0 Ankle / Brachial Index (ABI) - do not check if billed separately X- 1 5 Vital Signs Has the patient been seen at the hospital within the last three years: Yes Total Score: 110 Level Of Care: New/Established - Level 3 Electronic Signature([name]: 11/27/2022 3:07:10 PM By: Shawn Stall RN, BSN Entered By: Shawn Stall on 11/26/2022 10:39:54 -------------------------------------------------------------------------------- Encounter Discharge Information Details Patient Name: Date of Service: [name], Harry  RLO [name] 11/26/2022 12:45 PM Medical Record Number: [id number] Patient Account Number: [id number] Date of Birth/Sex: Treating RN: 09-20-1965 (57 y.o. [name] Primary Care Aryaa Bunting: Gayleen Orem, Zena Amos Other Clinician: Referring Doria Fern: Treating Archie Shea/Extender: Ernst Breach WN, [name] Weeks in Treatment: 17 Encounter Discharge Information Items Discharge Condition: Stable Ambulatory Status: Wheelchair Discharge Destination: Home Transportation: Private Auto Accompanied By: wife Schedule Follow-up Appointment: Yes Clinical Summary of Care: Electronic Signature([name]: 11/27/2022 3:07:10 PM By: Shawn Stall RN, BSN Entered By: Shawn Stall on 11/26/2022 10:40:26 -------------------------------------------------------------------------------- Lower Extremity Assessment Details Patient Name: Date of Service: [name] 11/26/2022 12:45 PM Medical Record Number: [id number] Patient Account Number: [id number] Date of Birth/Sex: Treating RN: August 19, 1965 (57 y.o. M) Primary Care Rether Rison: Gayleen Orem, Zena Amos Other Clinician: Referring Saw Mendenhall: Treating Gudelia Eugene/Extender: Ernst Breach WN, [name] Weeks in Treatment: 17 Electronic Signature([name]: 11/27/2022 4:39:33 PM By: Thayer Dallas Entered By: Thayer Dallas on 11/26/2022 10:18:31 -------------------------------------------------------------------------------- Multi Wound Chart Details Patient Name: Date of Service: [name], [name] 11/26/2022 12:45 PM [name] ([id number]) [id number] Page 4 of 12 Medical Record Number: [id number] Patient Account Number: [id number] Date of Birth/Sex: Treating RN: [birth year] (57 y.o. M) Primary Care Shavontae Gibeault: Gayleen Orem, Zena Amos Other Clinician: Referring Tomie Elko: Treating Tomeko Scoville/Extender: Ernst Breach WN, [name] Weeks in Treatment: 17 Vital Signs Height(in): Pulse(bpm): 85 Weight(lbs): Blood Pressure(mmHg):  117/73 Body Mass Index(BMI): Temperature(F): 97.9 Respiratory Rate(breaths/min): 18 [7:Photos:] Sacrum Groin Right Gluteus Wound Location: Pressure Injury Bump Skin Tear/Laceration Wounding Event: Pressure Ulcer Skin Tear Skin Tear Primary Etiology: Hypotension, Type II Diabetes, Hypotension, Type II Diabetes, Hypotension, Type II Diabetes, Comorbid History: Osteomyelitis, Paraplegia Osteomyelitis, Paraplegia Osteomyelitis, Paraplegia 07/12/2021 10/08/2022 10/24/2022 Date Acquired: 17 7 4  Weeks of Treatment: Open Healed - Epithelialized Healed - Epithelialized Wound Status: No No No Wound Recurrence: 5.9x3.8x4.3 0x0x0 0x0x0 Measurements L x W x D (cm) 17.609 0 0 A (cm) :  rea 75.717 0 0 Volume (cm) : 74.90% 100.00% 100.00% % Reduction in A rea: 67.30% 100.00% 100.00% % Reduction in Volume: 10 Starting Position 1 (o'clock): 12 Ending Position 1 (o'clock): 5.1 Maximum Distance 1 (cm): Yes No No Undermining: Category/Stage IV Full Thickness Without Exposed Full Thickness Without Exposed Classification: Support Structures Support Structures Large None Present None Present Exudate Amount: Serosanguineous N/A N/A Exudate Type: red, brown N/A N/A Exudate Color: Distinct, outline attached Epibole N/A Wound Margin: Medium (34-66%) None Present (0%) None Present (0%) Granulation Amount: Friable N/A N/A Granulation Quality: Medium (34-66%) None Present (0%) None Present (0%) Necrotic Amount: Fat Layer (Subcutaneous Tissue): Yes Fascia: No Fascia: No Exposed Structures: Muscle: Yes Fat Layer (Subcutaneous Tissue): No Fat Layer (Subcutaneous Tissue): No Bone: Yes Tendon: No Tendon: No Fascia: No Muscle: No Muscle: No Tendon: No Joint: No Joint: No Joint: No Bone: No Bone: No None Large (67-100%) Large (67-100%) Epithelialization: Excoriation: No Excoriation: No Excoriation: No Periwound Skin Texture: Induration: No Induration: No Induration: No Callus:  No Callus: No Callus: No Crepitus: No Crepitus: No Crepitus: No Rash: No Rash: No Rash: No Scarring: No Scarring: No Scarring: No Maceration: No Maceration: No Maceration: No Periwound Skin Moisture: Dry/Scaly: No Dry/Scaly: No Dry/Scaly: No Atrophie Blanche: No Atrophie Blanche: No Atrophie Blanche: No Periwound Skin Color: Cyanosis: No Cyanosis: No Cyanosis: No Ecchymosis: No Ecchymosis: No Ecchymosis: No Erythema: No Erythema: No Erythema: No Hemosiderin Staining: No Hemosiderin Staining: No Hemosiderin Staining: No Mottled: No Mottled: No Mottled: No Pallor: No Pallor: No Pallor: No Rubor: No Rubor: No Rubor: No No Abnormality No Abnormality N/A Temperature: N/A Per wife just popped, and. Was N/A Assessment Notes: bleeding. But has been there for years. Treatment Notes Wound #7 [name]) [name], [name] ([id number]) [id number] Page 5 of 12 Cleanser Wound Cleanser Discharge Instruction: Cleanse the wound with wound cleanser prior to applying a clean dressing using gauze sponges, not tissue or cotton balls. Peri-Wound Care Skin Prep Discharge Instruction: Use skin prep as directed AandD ointment Discharge Instruction: apply to any excoriated area. Topical Primary Dressing Dakin'[name] Solution 0.25%, 16 (oz) Discharge Instruction: Moisten gauze with Dakin'[name] solution Secondary Dressing ABD Pad, 8x10 Discharge Instruction: Apply over primary dressing as directed. Secured With Compression Wrap Compression Stockings Add-Ons Wound #8 (Groin) Cleanser Peri-Wound Care Topical Primary Dressing Secondary Dressing Secured With Compression Wrap Compression Stockings Add-Ons Wound #9 (Gluteus) Wound Laterality: Right Cleanser Peri-Wound Care Topical Primary Dressing Secondary Dressing Secured With Compression Wrap Compression Stockings Add-Ons Electronic Signature([name]: 11/26/2022 4:40:46 PM By: Baltazar Najjar  MD Entered By: Baltazar Najjar on 11/26/2022 10:45:34 [name] ([id number]) [id number] Page 6 of 12 -------------------------------------------------------------------------------- Multi-Disciplinary Care Plan Details Patient Name: Date of Service: [name] 11/26/2022 12:45 PM Medical Record Number: [id number] Patient Account Number: [id number] Date of Birth/Sex: Treating RN: 05-Aug-1965 (57 y.o. [name] Primary Care Kamarri Lovvorn: Gayleen Orem, Zena Amos Other Clinician: Referring Donisha Hoch: Treating Kroy Sprung/Extender: Ernst Breach WN, [name] Weeks in Treatment: 17 Active Inactive Pressure Nursing Diagnoses: Knowledge deficit related to causes and risk factors for pressure ulcer development Knowledge deficit related to management of pressures ulcers Goals: Patient will remain free from development of additional pressure ulcers Date Initiated: 07/25/2022 Target Resolution Date: 01/03/2023 Goal Status: Active Patient will remain free of pressure ulcers Date Initiated: 07/25/2022 Date Inactivated: 11/26/2022 Target Resolution Date: 11/26/2022 Goal Status: Met Patient/caregiver will verbalize risk factors for pressure ulcer development Date Initiated: 07/25/2022 Target Resolution Date: 01/10/2023 Goal Status: Active Patient/caregiver will verbalize understanding of  pressure ulcer management Date Initiated: 07/25/2022 Date Inactivated: 11/26/2022 Target Resolution Date: 11/26/2022 Goal Status: Met Interventions: Assess: immobility, friction, shearing, incontinence upon admission and as needed Assess offloading mechanisms upon admission and as needed Assess potential for pressure ulcer upon admission and as needed Provide education on pressure ulcers Notes: Wound/Skin Impairment Nursing Diagnoses: Impaired tissue integrity Knowledge deficit related to ulceration/compromised skin integrity Goals: Patient will have a decrease in  wound volume by X% from date: (specify in notes) Date Initiated: 07/25/2022 Target Resolution Date: 01/10/2023 Goal Status: Active Patient/caregiver will verbalize understanding of skin care regimen Date Initiated: 07/25/2022 Date Inactivated: 11/26/2022 Target Resolution Date: 11/26/2022 Goal Status: Met Ulcer/skin breakdown will have a volume reduction of 30% by week 4 Date Initiated: 07/25/2022 Date Inactivated: 08/23/2022 Target Resolution Date: 08/22/2022 Goal Status: Unmet Unmet Reason: remains the same. Interventions: Assess patient/caregiver ability to obtain necessary supplies Assess patient/caregiver ability to perform ulcer/skin care regimen upon admission and as needed Assess ulceration([name]) every visit Provide education on ulcer and skin care Notes: Electronic Signature([name]: 11/27/2022 3:07:10 PM By: Shawn Stall RN, BSN Entered By: Shawn Stall on 11/26/2022 10:39:03 [name] ([id number]) [id number] Page 7 of 12 -------------------------------------------------------------------------------- Pain Assessment Details Patient Name: Date of Service: [name] 11/26/2022 12:45 PM Medical Record Number: [id number] Patient Account Number: [id number] Date of Birth/Sex: Treating RN: [birth year] (57 y.o. [name] Primary Care Hamp Moreland: Gayleen Orem, Zena Amos Other Clinician: Referring Keayra Graham: Treating November Sypher/Extender: Ernst Breach WN, [name] Weeks in Treatment: 17 Active Problems Location of Pain Severity and Description of Pain Patient Has Paino No Site Locations Pain Management and Medication Current Pain Management: Electronic Signature([name]: 11/27/2022 4:07:14 PM By: Brenton Grills Entered By: Brenton Grills on 11/26/2022 10:06:45 -------------------------------------------------------------------------------- Patient/Caregiver Education Details Patient Name: Date of Service: [name]  10/15/2024andnbsp12:45 PM Medical Record Number: [id number] Patient Account Number: [id number] Date of Birth/Gender: Treating RN: 09-23-1965 (57 y.o. [name] Primary Care Physician: Gayleen Orem, Zena Amos Other Clinician: Referring Physician: Treating Physician/Extender: Enos Fling Weeks in Treatment: 65 Education Assessment Education Provided To: Patient and Caregiver Education Topics Provided Pressure: Handouts: Pressure Injury: Prevention and [name], [name] ([id number]) [id number] Page 8 of 12 Methods: Explain/Verbal Responses: Reinforcements needed Wound/Skin Impairment: Handouts: Caring for Your Ulcer Methods: Explain/Verbal Responses: Reinforcements needed Electronic Signature([name]: 11/27/2022 3:07:10 PM By: Shawn Stall RN, BSN Entered By: Shawn Stall on 11/26/2022 10:39:21 -------------------------------------------------------------------------------- Wound Assessment Details Patient Name: Date of Service: [name], [name] 11/26/2022 12:45 PM Medical Record Number: [id number] Patient Account Number: [id number] Date of Birth/Sex: Treating RN: 1965/08/25 (58 y.o. M) Primary Care Stran Raper: Gayleen Orem, Zena Amos Other Clinician: Referring Danzell Birky: Treating Myrtle Haller/Extender: Ernst Breach WN, [name] Weeks in Treatment: 17 Wound Status Wound Number: 7 Primary Etiology: Pressure Ulcer Wound Location: Sacrum Wound Status: Open Wounding Event: Pressure Injury Comorbid History: Hypotension, Type II Diabetes, Osteomyelitis, Paraplegia Date Acquired: 07/12/2021 Weeks Of Treatment: 17 Clustered Wound: No Photos Wound Measurements Length: (cm) 5.9 Width: (cm) 3.8 Depth: (cm) 4.3 Area: (cm) 17.609 Volume: (cm) 75.717 % Reduction in Area: 74.9% % Reduction in Volume: 67.3% Epithelialization: None Undermining: Yes Starting Position (o'clock): 10 Ending Position (o'clock): 12 Maximum  Distance: (cm) 5.1 Wound Description Classification: Category/Stage IV Wound Margin: Distinct, outline attached Exudate Amount: Large Exudate Type: Serosanguineous Exudate Color: red, brown Foul Odor After Cleansing: No Slough/Fibrino Yes Wound Bed Granulation Amount: Medium (34-66%) Exposed Structure Granulation Quality: Friable Fascia Exposed: No Necrotic Amount: Medium (34-66%) Fat Layer (  Subcutaneous Tissue) ExposedMARTI, [name] ([id number]) [id number] Page 9 of 12 Necrotic Quality: Adherent Slough Tendon Exposed: No Muscle Exposed: Yes Necrosis of Muscle: Yes Joint Exposed: No Bone Exposed: Yes Periwound Skin Texture Texture Color No Abnormalities Noted: No No Abnormalities Noted: No Callus: No Atrophie Blanche: No Crepitus: No Cyanosis: No Excoriation: No Ecchymosis: No Induration: No Erythema: No Rash: No Hemosiderin Staining: No Scarring: No Mottled: No Pallor: No Moisture Rubor: No No Abnormalities Noted: No Dry / Scaly: No Temperature / Pain Maceration: No Temperature: No Abnormality Treatment Notes Wound #7 (Sacrum) Cleanser Wound Cleanser Discharge Instruction: Cleanse the wound with wound cleanser prior to applying a clean dressing using gauze sponges, not tissue or cotton balls. Peri-Wound Care Skin Prep Discharge Instruction: Use skin prep as directed AandD ointment Discharge Instruction: apply to any excoriated area. Topical Primary Dressing Dakin'[name] Solution 0.25%, 16 (oz) Discharge Instruction: Moisten gauze with Dakin'[name] solution Secondary Dressing ABD Pad, 8x10 Discharge Instruction: Apply over primary dressing as directed. Secured With Compression Wrap Compression Stockings Facilities manager) Signed: 11/27/2022 4:39:33 PM By: Thayer Dallas Entered By: Thayer Dallas on 11/26/2022 10:22:46 -------------------------------------------------------------------------------- Wound  Assessment Details Patient Name: Date of Service: [name], [name] 11/26/2022 12:45 PM Medical Record Number: [id number] Patient Account Number: [id number] Date of Birth/Sex: Treating RN: 07/22/1965 (57 y.o. [name] Primary Care Kroy Sprung: Gayleen Orem, Zena Amos Other Clinician: Referring Chantae Soo: Treating Chanler Mendonca/Extender: Ernst Breach WN, [name] Weeks in Treatment: 17 Wound Status Wound Number: 8 Primary Etiology: [address] CARRON, [name] ([id number]) [id number] Page 10 of 12 Wound Location: Groin Wound Status: Healed - Epithelialized Wounding Event: Bump Comorbid History: Hypotension, Type II Diabetes, Osteomyelitis, Paraplegia Date Acquired: 10/08/2022 Weeks Of Treatment: 7 Clustered Wound: No Photos Wound Measurements Length: (cm) Width: (cm) Depth: (cm) Area: (cm) Volume: (cm) 0 % Reduction in Area: 100% 0 % Reduction in Volume: 100% 0 Epithelialization: Large (67-100%) 0 Tunneling: No 0 Undermining: No Wound Description Classification: Full Thickness Without Exposed Suppor Wound Margin: Epibole Exudate Amount: None Present t Structures Foul Odor After Cleansing: No Slough/Fibrino No Wound Bed Granulation Amount: None Present (0%) Exposed Structure Necrotic Amount: None Present (0%) Fascia Exposed: No Fat Layer (Subcutaneous Tissue) Exposed: No Tendon Exposed: No Muscle Exposed: No Joint Exposed: No Bone Exposed: No Periwound Skin Texture Texture Color No Abnormalities Noted: No No Abnormalities Noted: No Callus: No Atrophie Blanche: No Crepitus: No Cyanosis: No Excoriation: No Ecchymosis: No Induration: No Erythema: No Rash: No Hemosiderin Staining: No Scarring: No Mottled: No Pallor: No Moisture Rubor: No No Abnormalities Noted: No Dry / Scaly: No Temperature / Pain Maceration: No Temperature: No Abnormality Assessment Notes Per wife just popped, and. Was bleeding. But has been there for  years. Treatment Notes Wound #8 (Groin) Cleanser Peri-Wound Care Topical Primary Dressing Secondary Dressing Secured With Compression [name], [name] ([id number]) [id number] Page 11 of 12 Compression Stockings Add-Ons Electronic Signature([name]: 11/27/2022 3:07:10 PM By: Shawn Stall RN, BSN Entered By: Shawn Stall on 11/26/2022 10:33:32 -------------------------------------------------------------------------------- Wound Assessment Details Patient Name: Date of Service: [name], [name] 11/26/2022 12:45 PM Medical Record Number: [id number] Patient Account Number: [id number] Date of Birth/Sex: Treating RN: 07-Dec-1965 (57 y.o. [name] Primary Care Keita Demarco: Gayleen Orem, Zena Amos Other Clinician: Referring Naphtali Zywicki: Treating Aishi Courts/Extender: Ernst Breach WN, [name] Weeks in Treatment: 17 Wound Status Wound Number: 9 Primary Etiology: Skin Tear Wound Location: Right Gluteus Wound Status: Healed - Epithelialized Wounding Event: Skin Tear/Laceration Comorbid History: Hypotension, Type II Diabetes, Osteomyelitis, Paraplegia  Date Acquired: 10/24/2022 Weeks Of Treatment: 4 Clustered Wound: No Photos Wound Measurements Length: (cm) Width: (cm) Depth: (cm) Area: (cm) Volume: (cm) 0 % Reduction in Area: 100% 0 % Reduction in Volume: 100% 0 Epithelialization: Large (67-100%) 0 Tunneling: No 0 Undermining: No Wound Description Classification: Full Thickness Without Exposed Support Exudate Amount: None Present Structures Foul Odor After Cleansing: No Slough/Fibrino No Wound Bed Granulation Amount: None Present (0%) Exposed Structure Necrotic Amount: None Present (0%) Fascia Exposed: No Fat Layer (Subcutaneous Tissue) Exposed: No Tendon Exposed: No Muscle Exposed: No Joint Exposed: No Bone Exposed: No Periwound Skin Texture Texture Color No Abnormalities Noted: No No Abnormalities Noted: No Callus: No Atrophie  Blanche: No Crepitus: No Cyanosis: No Excoriation: No Ecchymosis: No [name], BITTING ([id number]) [id number] Page 12 of 12 Induration: No Erythema: No Rash: No Hemosiderin Staining: No Scarring: No Mottled: No Pallor: No Moisture Rubor: No No Abnormalities Noted: No Dry / Scaly: No Maceration: No Treatment Notes Wound #9 (Gluteus) Wound Laterality: Right Cleanser Peri-Wound Care Topical Primary Dressing Secondary Dressing Secured With Compression Wrap Compression Stockings Add-Ons Electronic Signature([name]: 11/27/2022 3:07:10 PM By: Shawn Stall RN, BSN Entered By: Shawn Stall on 11/26/2022 10:33:40 -------------------------------------------------------------------------------- Vitals Details Patient Name: Date of Service: [name], [name] 11/26/2022 12:45 PM Medical Record Number: [id number] Patient Account Number: [id number] Date of Birth/Sex: Treating RN: 02-Sep-1965 (57 y.o. [name] Primary Care Tarron Krolak: Gayleen Orem, Zena Amos Other Clinician: Referring Maisee [place]: Treating Azariah Bonura/Extender: Ernst Breach WN, [name] Weeks in Treatment: 17 Vital Signs Time Taken: 13:06 Temperature (F): 97.9 Pulse (bpm): 85 Respiratory Rate (breaths/min): 18 Blood Pressure (mmHg): 117/73 Reference Range: 80 - 120 mg / dl Electronic Signature([name]: 11/27/2022 4:07:14 PM By: Brenton Grills Entered By: Brenton Grills on 11/26/2022 10:06:37

## 2022-12-25 ENCOUNTER — Encounter: Payer: 59 | Attending: Physical Medicine and Rehabilitation | Admitting: Physical Medicine and Rehabilitation

## 2022-12-25 ENCOUNTER — Encounter: Payer: Self-pay | Admitting: Physical Medicine and Rehabilitation

## 2022-12-25 VITALS — BP 99/62 | HR 74 | Ht 67.0 in | Wt 238.3 lb

## 2022-12-25 DIAGNOSIS — Z993 Dependence on wheelchair: Secondary | ICD-10-CM | POA: Diagnosis not present

## 2022-12-25 DIAGNOSIS — N319 Neuromuscular dysfunction of bladder, unspecified: Secondary | ICD-10-CM | POA: Diagnosis not present

## 2022-12-25 DIAGNOSIS — M7918 Myalgia, other site: Secondary | ICD-10-CM | POA: Diagnosis not present

## 2022-12-25 DIAGNOSIS — L89304 Pressure ulcer of unspecified buttock, stage 4: Secondary | ICD-10-CM | POA: Diagnosis not present

## 2022-12-25 DIAGNOSIS — K592 Neurogenic bowel, not elsewhere classified: Secondary | ICD-10-CM | POA: Diagnosis present

## 2022-12-25 DIAGNOSIS — G8250 Quadriplegia, unspecified: Secondary | ICD-10-CM | POA: Diagnosis not present

## 2022-12-25 MED ORDER — HYDROCODONE-ACETAMINOPHEN 5-325 MG PO TABS: 1.0000 | ORAL_TABLET | Freq: Four times a day (QID) | ORAL | 0 refills | Status: AC | PRN

## 2022-12-25 MED ORDER — LIDOCAINE HCL 1 % IJ SOLN
6.0000 mL | Freq: Once | INTRAMUSCULAR | Status: AC
Start: 2022-12-25 — End: 2022-12-25
  Administered 2022-12-25: 6 mL

## 2022-12-25 NOTE — Patient Instructions (Signed)
Plan: Needs refill of Norco-  -LAST FILLED 02/2022-will refill - gets so rarely, doesn't need oral drug screen right now- won't be positive.   2. We discussed my concerns about doing a trip to [place]- esp for 2-3 weeks if everything fine.   3. Last refilled 11/08/22- Baclofen- so doesn't need refills.    4. Doesn't need refill of Prozac, Baclofen, Gabapentin- con't meds.    5. Meds mess with his memory- and so when going for citizenship, I am willing to write that meds affect his memory- happy to do so.    6. Patient here for trigger point injections for  Consent done and on chart.  Cleaned areas with alcohol and injected using a 27 gauge 1.5 inch needle  Injected 6cc- none wasted Using 1% Lidocaine with no EPI  Upper traps- B/L  Levators- B/L  Posterior scalenes Middle scalenes- B/L  Splenius Capitus- b?L  Pectoralis Major- B/L  Rhomboids- b?l x 2 Infraspinatus Teres Major/minor- B/L x2  Thoracic paraspinals Lumbar paraspinals Other injections- B/L deltoids-    Patient's level of pain prior was 6-7/10 Current level of pain after injections is- downwards- is 5/10 now-relaxing  There was no bleeding or complications.  Patient was advised to drink a lot of water on day after injections to flush system Will have increased soreness for 12-48 hours after injections.  Can use Lidocaine patches the day AFTER injections Can use theracane on day of injections in places didn't inject Can use heating pad 4-6 hours AFTER injections   7. Filled out CAP program information for pt- since he meets criteria for CAP due to quadriplegia and bowel and bladder and stage IV pressure ulcer.   8. Con't cathing and bowel program as being done.   9.  F/U 3 months- for Tpr Injections and f/u- on SCI- double appt

## 2022-12-25 NOTE — Progress Notes (Signed)
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    Exam: Awake, alert, appropriate, in power w/c; accompanied by wife, NAD Seen pic of Sacral wound- appeared to be ~ 34 to 1 inch deep in picture- wife agrees Edges curled under slightly- but beefy red in wound bed on sides  Everything is "fine"- Saw Urology- finally- has kidney stones- and "problem with urine"- sometimes has blood in urine and sometimes doesn't.  Per wife.  Urology doesn't think needs surgery- but will monitor- unless something goes "wrong"-  Next appt 1 year unless any problems.   Still planning on going to [place]-   Plan: Needs refill of Norco-  -LAST FILLED 02/2022-will refill - gets so rarely, doesn't need oral drug screen right now- won't be positive.   2. We discussed my concerns about doing a trip to [place]- esp for 2-3 weeks if everything fine.   3. Last refilled 11/08/22- Baclofen- so doesn't need refills.    4. Doesn't need refill of Prozac, Baclofen, Gabapentin- con't meds.    5. Meds mess with his memory- and so when going for citizenship, I am willing to write that meds affect his memory- happy to do so.    6. Patient here for trigger point injections for  Consent done and on chart.  Cleaned areas with alcohol and injected using a 27 gauge 1.5 inch needle  Injected 6cc- none wasted Using 1% Lidocaine with no EPI  Upper traps- B/L  Levators- B/L  Posterior scalenes Middle scalenes- B/L  Splenius Capitus- b?L  Pectoralis Major- B/L  Rhomboids- b?l x 2 Infraspinatus Teres Major/minor- B/L x2  Thoracic paraspinals Lumbar paraspinals Other injections- B/L deltoids-     Patient's level of pain prior was 6-7/10 Current level of pain after injections is- downwards- is 5/10 now-relaxing  There was no bleeding or complications.  Patient was advised to drink a lot of water on day after injections to flush system Will have increased soreness for 12-48 hours after injections.  Can use Lidocaine patches the day AFTER injections Can use theracane on day of injections in places didn't inject Can use heating pad 4-6 hours AFTER injections   7. Filled out CAP program information for pt- since he meets criteria for CAP due to quadriplegia and bowel and bladder and stage IV pressure ulcer.   8. Con't cathing and bowel program as being done.   9.  F/U 3 months- for Tpr Injections and f/u- on SCI- double appt   I spent a total of 38    minutes on total care today- >50% coordination of care- due to 7 minutes on injections- rest discussing CAP program/paperwork; bowel and bladder, refills; and pressure ulcer.

## 2023-01-21 ENCOUNTER — Encounter (HOSPITAL_BASED_OUTPATIENT_CLINIC_OR_DEPARTMENT_OTHER): Payer: 59 | Attending: Internal Medicine | Admitting: Internal Medicine

## 2023-01-21 DIAGNOSIS — L89312 Pressure ulcer of right buttock, stage 2: Secondary | ICD-10-CM | POA: Insufficient documentation

## 2023-01-21 DIAGNOSIS — I872 Venous insufficiency (chronic) (peripheral): Secondary | ICD-10-CM | POA: Diagnosis not present

## 2023-01-21 DIAGNOSIS — E1151 Type 2 diabetes mellitus with diabetic peripheral angiopathy without gangrene: Secondary | ICD-10-CM | POA: Insufficient documentation

## 2023-01-21 DIAGNOSIS — L89154 Pressure ulcer of sacral region, stage 4: Secondary | ICD-10-CM | POA: Diagnosis not present

## 2023-01-21 DIAGNOSIS — Z794 Long term (current) use of insulin: Secondary | ICD-10-CM | POA: Insufficient documentation

## 2023-01-21 DIAGNOSIS — G8221 Paraplegia, complete: Secondary | ICD-10-CM | POA: Diagnosis not present

## 2023-01-21 DIAGNOSIS — I890 Lymphedema, not elsewhere classified: Secondary | ICD-10-CM | POA: Insufficient documentation

## 2023-01-21 DIAGNOSIS — Z7901 Long term (current) use of anticoagulants: Secondary | ICD-10-CM | POA: Diagnosis not present

## 2023-01-21 DIAGNOSIS — E11622 Type 2 diabetes mellitus with other skin ulcer: Secondary | ICD-10-CM | POA: Insufficient documentation
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
## 2023-01-22 NOTE — Progress Notes (Signed)
GRYPHON, MAUTE ([id number]) [id number] Page 1 of 9 Visit Report for 01/21/2023 Arrival Information Details Patient Name: Date of Service: [name] 01/21/2023 12:30 PM Medical Record Number: [id number] Patient Account Number: [id number] Date of Birth/Sex: Treating RN: 1965/09/11 (57 y.o. [name], [name] Primary Care Secilia Apps: Gayleen Orem, QMVHQION Other Clinician: Referring Nakoma Gotwalt: Treating Kelsey Durflinger/Extender: Ernst Breach WN, [name] Weeks in Treatment: 25 Visit Information History Since Last Visit Added or deleted any medications: No Patient Arrived: Wheel Chair Any new allergies or adverse reactions: No Arrival Time: 12:45 Had a fall or experienced change in No Accompanied By: wife activities of daily living that may affect Transfer Assistance: [name] Lift risk of falls: Patient Identification Verified: Yes Signs or symptoms of abuse/neglect since last visito No Secondary Verification Process Completed: Yes Hospitalized since last visit: No Patient Requires Transmission-Based Precautions: No Implantable device outside of the clinic excluding No Patient Has Alerts: No cellular tissue based products placed in the center since last visit: Has Dressing in Place as Prescribed: Yes Pain Present Now: No Electronic Signature(s) Signed: 01/21/2023 3:58:30 PM By: Fonnie Mu RN Entered By: Fonnie Mu on 01/21/2023 09:45:22 -------------------------------------------------------------------------------- Clinic Level of Care Assessment Details Patient Name: Date of Service: [name] 01/21/2023 12:30 PM Medical Record Number: [id number] Patient Account Number: [id number] Date of Birth/Sex: Treating RN: [birth year] (57 y.o. [name], [name] Primary Care Kamin Niblack: Gayleen Orem, Zena Amos Other Clinician: Referring Shashank Kwasnik: Treating Josemanuel Eakins/Extender: Ernst Breach WN, [name] Weeks in Treatment: 25 Clinic Level  of Care Assessment Items TOOL 4 Quantity Score X- 1 0 Use when only an EandM is performed on FOLLOW-UP visit ASSESSMENTS - Nursing Assessment / Reassessment X- 1 10 Reassessment of Co-morbidities (includes updates in patient status) X- 1 5 Reassessment of Adherence to Treatment Plan ASSESSMENTS - Wound and Skin A ssessment / Reassessment X - Simple Wound Assessment / Reassessment - one wound 1 5 []  - 0 Complex Wound Assessment / Reassessment - multiple wounds []  - 0 Dermatologic / Skin Assessment (not related to wound area) ASSESSMENTS - Focused Assessment []  - 0 Circumferential Edema Measurements - multi extremities []  - 0 Nutritional Assessment / Counseling / Intervention ERICE, [name] ([id number]) [id number] Page 2 of 9 []  - 0 Lower Extremity Assessment (monofilament, tuning fork, pulses) []  - 0 Peripheral Arterial Disease Assessment (using hand held doppler) ASSESSMENTS - Ostomy and/or Continence Assessment and Care []  - 0 Incontinence Assessment and Management []  - 0 Ostomy Care Assessment and Management (repouching, etc.) PROCESS - Coordination of Care X - Simple Patient / Family Education for ongoing care 1 15 []  - 0 Complex (extensive) Patient / Family Education for ongoing care X- 1 10 Staff obtains Chiropractor, Records, T Results / Process Orders est []  - 0 Staff telephones HHA, Nursing Homes / Clarify orders / etc []  - 0 Routine Transfer to another Facility (non-emergent condition) []  - 0 Routine Hospital Admission (non-emergent condition) []  - 0 New Admissions / Manufacturing engineer / Ordering NPWT Apligraf, etc. , []  - 0 Emergency Hospital Admission (emergent condition) X- 1 10 Simple Discharge Coordination []  - 0 Complex (extensive) Discharge Coordination PROCESS - Special Needs []  - 0 Pediatric / Minor Patient Management []  - 0 Isolation Patient Management []  - 0 Hearing / Language / Visual special needs []  -  0 Assessment of Community assistance (transportation, D/C planning, etc.) []  - 0 Additional assistance / Altered mentation []  - 0 Support Surface(s) Assessment (bed, cushion, seat, etc.) INTERVENTIONS - Wound Cleansing / Measurement  X - Simple Wound Cleansing - one wound 1 5 []  - 0 Complex Wound Cleansing - multiple wounds X- 1 5 Wound Imaging (photographs - any number of wounds) []  - 0 Wound Tracing (instead of photographs) X- 1 5 Simple Wound Measurement - one wound []  - 0 Complex Wound Measurement - multiple wounds INTERVENTIONS - Wound Dressings X - Small Wound Dressing one or multiple wounds 1 10 []  - 0 Medium Wound Dressing one or multiple wounds []  - 0 Large Wound Dressing one or multiple wounds []  - 0 Application of Medications - topical []  - 0 Application of Medications - injection INTERVENTIONS - Miscellaneous []  - 0 External ear exam []  - 0 Specimen Collection (cultures, biopsies, blood, body fluids, etc.) []  - 0 Specimen(s) / Culture(s) sent or taken to Lab for analysis X- 1 10 Patient Transfer (multiple staff / Nurse, adult / Similar devices) []  - 0 Simple Staple / Suture removal (25 or less) []  - 0 Complex Staple / Suture removal (26 or more) []  - 0 Hypo / Hyperglycemic Management (close monitor of Blood Glucose) DIERK, [name] ([id number]) [id number] Page 3 of 9 []  - 0 Ankle / Brachial Index (ABI) - do not check if billed separately X- 1 5 Vital Signs Has the patient been seen at the hospital within the last three years: Yes Total Score: 95 Level Of Care: New/Established - Level 3 Electronic Signature(s) Signed: 01/21/2023 3:58:30 PM By: Fonnie Mu RN Entered By: Fonnie Mu on 01/21/2023 10:17:05 -------------------------------------------------------------------------------- Encounter Discharge Information Details Patient Name: Date of Service: [name], [name] 01/21/2023 12:30 PM Medical Record Number:  [id number] Patient Account Number: [id number] Date of Birth/Sex: Treating RN: January 24, 1966 (57 y.o. Lucious Groves Primary Care Ermagene Saidi: Gayleen Orem, Zena Amos Other Clinician: Referring Luella Gardenhire: Treating Octavian Godek/Extender: Ernst Breach WN, [name] Weeks in Treatment: 25 Encounter Discharge Information Items Discharge Condition: Stable Ambulatory Status: Wheelchair Discharge Destination: Home Transportation: Private Auto Accompanied By: wife Schedule Follow-up Appointment: Yes Clinical Summary of Care: Patient Declined Electronic Signature(s) Signed: 01/21/2023 3:58:30 PM By: Fonnie Mu RN Entered By: Fonnie Mu on 01/21/2023 10:17:45 -------------------------------------------------------------------------------- Lower Extremity Assessment Details Patient Name: Date of Service: [name], [name] 01/21/2023 12:30 PM Medical Record Number: [id number] Patient Account Number: [id number] Date of Birth/Sex: Treating RN: August 18, 1965 (57 y.o. Lucious Groves Primary Care Luvena Wentling: Gayleen Orem, Zena Amos Other Clinician: Referring Cleon Signorelli: Treating Tyrica Afzal/Extender: Ernst Breach WN, [name] Weeks in Treatment: 25 Electronic Signature(s) Signed: 01/21/2023 3:58:30 PM By: Fonnie Mu RN Entered By: Fonnie Mu on 01/21/2023 09:48:04 -------------------------------------------------------------------------------- Multi Wound Chart Details Patient Name: Date of Service: [name], [name] 01/21/2023 12:30 PM Medical Record Number: [id number] Patient Account Number: [id number] CAMILE, RUDDLE ([id number]) [id number] Page 4 of 9 Date of Birth/Sex: Treating RN: 1965-06-22 (57 y.o. M) Primary Care Layney Gillson: Other Clinician: Kalman Shan Referring Zanita Millman: Treating Jabez Molner/Extender: Ernst Breach WN, [name] Weeks in Treatment: 25 Vital Signs Height(in): Pulse(bpm): 76 Weight(lbs): Blood Pressure(mmHg):  121/73 Body Mass Index(BMI): Temperature(F): 98.2 Respiratory Rate(breaths/min): 17 [7:Photos:] [N/A:N/A] Sacrum N/A N/A Wound Location: Pressure Injury N/A N/A Wounding Event: Pressure Ulcer N/A N/A Primary Etiology: Hypotension, Type II Diabetes, N/A N/A Comorbid History: Osteomyelitis, Paraplegia 07/12/2021 N/A N/A Date Acquired: 25 N/A N/A Weeks of Treatment: Open N/A N/A Wound Status: No N/A N/A Wound Recurrence: 4.5x2.5x4 N/A N/A Measurements L x W x D (cm) 8.836 N/A N/A A (cm) : rea 35.343 N/A N/A Volume (cm) : 87.40% N/A N/A % Reduction in A rea: 84.70%  N/A N/A % Reduction in Volume: 9 Starting Position 1 (o'clock): 1 Ending Position 1 (o'clock): 4.5 Maximum Distance 1 (cm): Yes N/A N/A Undermining: Category/Stage IV N/A N/A Classification: Large N/A N/A Exudate A mount: Serosanguineous N/A N/A Exudate Type: red, brown N/A N/A Exudate Color: Distinct, outline attached N/A N/A Wound Margin: Medium (34-66%) N/A N/A Granulation A mount: Friable N/A N/A Granulation Quality: Medium (34-66%) N/A N/A Necrotic A mount: Fat Layer (Subcutaneous Tissue): Yes N/A N/A Exposed Structures: Muscle: Yes Bone: Yes Fascia: No Tendon: No Joint: No None N/A N/A Epithelialization: Excoriation: No N/A N/A Periwound Skin Texture: Induration: No Callus: No Crepitus: No Rash: No Scarring: No Maceration: No N/A N/A Periwound Skin Moisture: Dry/Scaly: No Atrophie Blanche: No N/A N/A Periwound Skin Color: Cyanosis: No Ecchymosis: No Erythema: No Hemosiderin Staining: No Mottled: No Pallor: No Rubor: No No Abnormality N/A N/A Temperature: Treatment Notes Wound #7 (Sacrum) Cleanser Wound Cleanser Discharge Instruction: Cleanse the wound with wound cleanser prior to applying a clean dressing using gauze sponges, not tissue or cotton balls. HARLIS, CAFFEE ([id number]) [id number] Page 5 of 9 Peri-Wound Care Skin  Prep Discharge Instruction: Use skin prep as directed AandD ointment Discharge Instruction: apply to any excoriated area. Topical Primary Dressing Dakin's Solution 0.25%, 16 (oz) Discharge Instruction: Moisten gauze with Dakin's solution Secondary Dressing ABD Pad, 8x10 Discharge Instruction: Apply over primary dressing as directed. Secured With Compression Wrap Compression Stockings Facilities manager) Signed: 01/22/2023 10:03:28 AM By: Baltazar Najjar MD Entered By: Baltazar Najjar on 01/21/2023 11:11:34 -------------------------------------------------------------------------------- Multi-Disciplinary Care Plan Details Patient Name: Date of Service: [name], [name] 01/21/2023 12:30 PM Medical Record Number: [id number] Patient Account Number: [id number] Date of Birth/Sex: Treating RN: [birth year] (57 y.o. [name], [name] Primary Care Vishruth Seoane: Gayleen Orem, Zena Amos Other Clinician: Referring Hennesy Sobalvarro: Treating Lucilla Petrenko/Extender: Ernst Breach WN, [name] Weeks in Treatment: 25 Active Inactive Pressure Nursing Diagnoses: Knowledge deficit related to causes and risk factors for pressure ulcer development Knowledge deficit related to management of pressures ulcers Goals: Patient will remain free from development of additional pressure ulcers Date Initiated: 07/25/2022 Target Resolution Date: 02/08/2023 Goal Status: Active Patient will remain free of pressure ulcers Date Initiated: 07/25/2022 Date Inactivated: 11/26/2022 Target Resolution Date: 11/26/2022 Goal Status: Met Patient/caregiver will verbalize risk factors for pressure ulcer development Date Initiated: 07/25/2022 Target Resolution Date: 02/08/2023 Goal Status: Active Patient/caregiver will verbalize understanding of pressure ulcer management Date Initiated: 07/25/2022 Date Inactivated: 11/26/2022 Target Resolution Date: 11/26/2022 Goal Status: Met Interventions: Assess: immobility,  friction, shearing, incontinence upon admission and as needed Assess offloading mechanisms upon admission and as needed Assess potential for pressure ulcer upon admission and as needed EDWIN, DUDEK ([id number]) [id number] Page 6 of 9 Provide education on pressure ulcers Notes: Wound/Skin Impairment Nursing Diagnoses: Impaired tissue integrity Knowledge deficit related to ulceration/compromised skin integrity Goals: Patient will have a decrease in wound volume by X% from date: (specify in notes) Date Initiated: 07/25/2022 Target Resolution Date: 02/08/2023 Goal Status: Active Patient/caregiver will verbalize understanding of skin care regimen Date Initiated: 07/25/2022 Date Inactivated: 11/26/2022 Target Resolution Date: 11/26/2022 Goal Status: Met Ulcer/skin breakdown will have a volume reduction of 30% by week 4 Date Initiated: 07/25/2022 Date Inactivated: 08/23/2022 Target Resolution Date: 08/22/2022 Goal Status: Unmet Unmet Reason: remains the same. Interventions: Assess patient/caregiver ability to obtain necessary supplies Assess patient/caregiver ability to perform ulcer/skin care regimen upon admission and as needed Assess ulceration(s) every visit Provide education on ulcer and skin care Notes: Electronic Signature(s) Signed: 01/21/2023 3:58:30 PM By:  Fonnie Mu RN Entered By: Fonnie Mu on 01/21/2023 10:10:17 -------------------------------------------------------------------------------- Pain Assessment Details Patient Name: Date of Service: [name] 01/21/2023 12:30 PM Medical Record Number: [id number] Patient Account Number: [id number] Date of Birth/Sex: Treating RN: 03/16/1965 (57 y.o. Lucious Groves Primary Care Dimitra Woodstock: Gayleen Orem, Zena Amos Other Clinician: Referring Scottlyn Mchaney: Treating Alizee Maple/Extender: Ernst Breach WN, [name] Weeks in Treatment: 25 Active Problems Location of Pain Severity and  Description of Pain Patient Has Paino No Site Locations Pain Management and Medication Current Pain Management: DEMONTRE, KIMBER ([id number]) [id number] Page 7 of 9 Electronic Signature(s) Signed: 01/21/2023 3:58:30 PM By: Fonnie Mu RN Entered By: Fonnie Mu on 01/21/2023 09:47:57 -------------------------------------------------------------------------------- Patient/Caregiver Education Details Patient Name: Date of Service: [name] 12/10/2024andnbsp12:30 PM Medical Record Number: [id number] Patient Account Number: [id number] Date of Birth/Gender: Treating RN: 12-10-1965 (57 y.o. Lucious Groves Primary Care Physician: Gayleen Orem, Zena Amos Other Clinician: Referring Physician: Treating Physician/Extender: Enos Fling Weeks in Treatment: 25 Education Assessment Education Provided To: Patient Education Topics Provided Wound/Skin Impairment: Methods: Explain/Verbal Responses: Reinforcements needed, State content correctly Electronic Signature(s) Signed: 01/21/2023 3:58:30 PM By: Fonnie Mu RN Entered By: Fonnie Mu on 01/21/2023 10:10:31 -------------------------------------------------------------------------------- Wound Assessment Details Patient Name: Date of Service: [name], [name] 01/21/2023 12:30 PM Medical Record Number: [id number] Patient Account Number: [id number] Date of Birth/Sex: Treating RN: 02-22-1965 (57 y.o. Lucious Groves Primary Care Berania Peedin: Gayleen Orem, Zena Amos Other Clinician: Referring Myah Guynes: Treating Brewster Wolters/Extender: Ernst Breach WN, [name] Weeks in Treatment: 25 Wound Status Wound Number: 7 Primary Etiology: Pressure Ulcer Wound Location: Sacrum Wound Status: Open Wounding Event: Pressure Injury Comorbid History: Hypotension, Type II Diabetes, Osteomyelitis, Paraplegia Date Acquired: 07/12/2021 Weeks Of Treatment: 25 Clustered Wound:  No Photos GERAL, MALPASS ([id number]) [id number] Page 8 of 9 Wound Measurements Length: (cm) 4.5 Width: (cm) 2.5 Depth: (cm) 4 Area: (cm) 8.836 Volume: (cm) 35.343 % Reduction in Area: 87.4% % Reduction in Volume: 84.7% Epithelialization: None Tunneling: No Undermining: Yes Starting Position (o'clock): 9 Ending Position (o'clock): 1 Maximum Distance: (cm) 4.5 Wound Description Classification: Category/Stage IV Wound Margin: Distinct, outline attached Exudate Amount: Large Exudate Type: Serosanguineous Exudate Color: red, brown Foul Odor After Cleansing: No Slough/Fibrino Yes Wound Bed Granulation Amount: Medium (34-66%) Exposed Structure Granulation Quality: Friable Fascia Exposed: No Necrotic Amount: Medium (34-66%) Fat Layer (Subcutaneous Tissue) Exposed: Yes Necrotic Quality: Adherent Slough Tendon Exposed: No Muscle Exposed: Yes Necrosis of Muscle: Yes Joint Exposed: No Bone Exposed: Yes Periwound Skin Texture Texture Color No Abnormalities Noted: No No Abnormalities Noted: No Callus: No Atrophie Blanche: No Crepitus: No Cyanosis: No Excoriation: No Ecchymosis: No Induration: No Erythema: No Rash: No Hemosiderin Staining: No Scarring: No Mottled: No Pallor: No Moisture Rubor: No No Abnormalities Noted: No Dry / Scaly: No Temperature / Pain Maceration: No Temperature: No Abnormality Treatment Notes Wound #7 (Sacrum) Cleanser Wound Cleanser Discharge Instruction: Cleanse the wound with wound cleanser prior to applying a clean dressing using gauze sponges, not tissue or cotton balls. Peri-Wound Care Skin Prep Discharge Instruction: Use skin prep as directed AandD ointment Discharge Instruction: apply to any excoriated area. Topical Primary Dressing Dakin's Solution 0.25%, 16 (oz) Discharge Instruction: Moisten gauze with Dakin's solution AGIM, KINGCADE ([id number]) [id number] Page 9 of  9 Secondary Dressing ABD Pad, 8x10 Discharge Instruction: Apply over primary dressing as directed. Secured With Compression Wrap Compression Stockings Facilities manager) Signed: 01/21/2023 3:58:30 PM By: Fonnie Mu RN Signed: 01/21/2023 5:09:16 PM By: Shawn Stall RN,  BSN Entered By: Shawn Stall on 01/21/2023 10:01:59 -------------------------------------------------------------------------------- Vitals Details Patient Name: Date of Service: [name] 01/21/2023 12:30 PM Medical Record Number: [id number] Patient Account Number: [id number] Date of Birth/Sex: Treating RN: Sep 09, 1965 (57 y.o. [name], [name] Primary Care Norlan Rann: Gayleen Orem, Zena Amos Other Clinician: Referring Florice Hindle: Treating Sheily Lineman/Extender: Ernst Breach WN, [name] Weeks in Treatment: 25 Vital Signs Time Taken: 12:46 Temperature (F): 98.2 Pulse (bpm): 76 Respiratory Rate (breaths/min): 17 Blood Pressure (mmHg): 121/73 Reference Range: 80 - 120 mg / dl Electronic Signature(s) Signed: 01/21/2023 3:58:30 PM By: Fonnie Mu RN Entered By: Fonnie Mu on 01/21/2023 09:47:53

## 2023-01-31 ENCOUNTER — Ambulatory Visit: Payer: 59 | Admitting: Physical Medicine and Rehabilitation

## 2023-02-25 ENCOUNTER — Other Ambulatory Visit: Payer: Self-pay | Admitting: Physical Medicine and Rehabilitation

## 2023-03-18 ENCOUNTER — Encounter (HOSPITAL_BASED_OUTPATIENT_CLINIC_OR_DEPARTMENT_OTHER): Payer: 59 | Attending: Internal Medicine | Admitting: Internal Medicine

## 2023-03-18 DIAGNOSIS — L89154 Pressure ulcer of sacral region, stage 4: Secondary | ICD-10-CM | POA: Diagnosis present

## 2023-03-18 DIAGNOSIS — G8221 Paraplegia, complete: Secondary | ICD-10-CM | POA: Insufficient documentation

## 2023-03-18 DIAGNOSIS — Z794 Long term (current) use of insulin: Secondary | ICD-10-CM | POA: Diagnosis not present

## 2023-03-18 DIAGNOSIS — E11622 Type 2 diabetes mellitus with other skin ulcer: Secondary | ICD-10-CM | POA: Diagnosis not present

## 2023-03-18 DIAGNOSIS — E1165 Type 2 diabetes mellitus with hyperglycemia: Secondary | ICD-10-CM | POA: Insufficient documentation

## 2023-03-18 DIAGNOSIS — M4628 Osteomyelitis of vertebra, sacral and sacrococcygeal region: Secondary | ICD-10-CM | POA: Insufficient documentation

## 2023-03-26 ENCOUNTER — Encounter: Payer: 59 | Attending: Physical Medicine and Rehabilitation | Admitting: Physical Medicine and Rehabilitation

## 2023-03-26 ENCOUNTER — Encounter: Payer: Self-pay | Admitting: Physical Medicine and Rehabilitation

## 2023-03-26 VITALS — BP 104/71 | HR 72 | Ht 67.0 in

## 2023-03-26 DIAGNOSIS — G8250 Quadriplegia, unspecified: Secondary | ICD-10-CM

## 2023-03-26 DIAGNOSIS — Z79891 Long term (current) use of opiate analgesic: Secondary | ICD-10-CM | POA: Diagnosis present

## 2023-03-26 DIAGNOSIS — L89304 Pressure ulcer of unspecified buttock, stage 4: Secondary | ICD-10-CM

## 2023-03-26 DIAGNOSIS — Z5181 Encounter for therapeutic drug level monitoring: Secondary | ICD-10-CM | POA: Diagnosis not present

## 2023-03-26 DIAGNOSIS — M7918 Myalgia, other site: Secondary | ICD-10-CM

## 2023-03-26 DIAGNOSIS — G894 Chronic pain syndrome: Secondary | ICD-10-CM | POA: Diagnosis present

## 2023-03-26 MED ORDER — GABAPENTIN 600 MG PO TABS: 600.0000 mg | ORAL_TABLET | Freq: Three times a day (TID) | ORAL | 3 refills | Status: DC

## 2023-03-26 MED ORDER — FLUOXETINE HCL 20 MG PO CAPS: 20.0000 mg | ORAL_CAPSULE | Freq: Every day | ORAL | 3 refills | Status: DC

## 2023-03-26 MED ORDER — LIDOCAINE HCL 1 % IJ SOLN
6.0000 mL | Freq: Once | INTRAMUSCULAR | Status: AC
Start: 2023-03-26 — End: 2023-03-26
  Administered 2023-03-26: 6 mL

## 2023-03-26 NOTE — Progress Notes (Signed)
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     [place] trip went well  Went to wound care last week- showed some improvement-   Did refer to wound care clinic in [place]-  Because has it so long, so wanted to see if there's anything else that could try to improve wound.   Sand bed- still.  Does pressure relief- but forgets to do frequently-  Falls asleep when reminded-    Takes baclofen as needed for muscle tightness 2x/day- usually-  only when needs it.   Per pt, 2x/day is working-   Plan: What will help the most is pressure relief. Was able to tell me how to do pressure relief- however said he's not doing pressure relief enough- needs to do every 15-20 minutes.    2. Oral drug screen today per clinic policy.    3. Con't Norco- 5/325 mg  up to 4x/day- but was given 30 2 months ago- has ~ 15 pills left- uses tylenol when possible.  Doesn't need refill today.    4. Con't  Gabapentin 600 mg 3x/day- # 270- 3 refills   5. Con't Prozac/Fluoxetine 20 mg daily- will send more just in case they didn't receive last set of refills- # 90- with 3 refills.    6. Con't Baclofen 10 mg 3x/day as needed- usually takes 2x/day- has refills   7. Patient here for trigger point injections for  Consent done and on chart.  Cleaned areas with alcohol and injected using a 27 gauge 1.5 inch needle  Injected 4.5 cc- wasted 1.5cc Using 1% Lidocaine with no EPI  Upper traps B/L  Levators- B/L  Posterior scalenes Middle scalenes- B/L  Splenius Capitus- B/L  Pectoralis Major- B/L  Rhomboids- B/L  Infraspinatus Teres Major/minor- B/L  Thoracic paraspinals Lumbar  paraspinals Other injections- deltoids a    There was no bleeding or complications.  Patient was advised to drink a lot of water on day after injections to flush system Will have increased soreness for 12-48 hours after injections.  Can use Lidocaine patches the day AFTER injections Can use theracane on day of injections in places didn't inject Can use heating pad 4-6 hours AFTER injections   8. F/U in 3 months- double appt- SCI and trp injections.    I spent a total of  29  minutes on total care today- >50% coordination of care- due to  7 minutes on injections- rest educating on pressure relief- and healing of wound- stage IV-  Oral drug screen and d/w pt about painmeds

## 2023-03-26 NOTE — Patient Instructions (Signed)
Plan: What will help the most is pressure relief. Was able to tell me how to do pressure relief- however said he's not doing pressure relief enough- needs to do every 15-20 minutes.    2. Oral drug screen today per clinic policy.    3. Con't Norco- 5/325 mg  up to 4x/day- but was given 30 2 months ago- has ~ 15 pills left- uses tylenol when possible.  Doesn't need refill today.    4. Con't  Gabapentin 600 mg 3x/day- # 270- 3 refills   5. Con't Prozac/Fluoxetine 20 mg daily- will send more just in case they didn't receive last set of refills- # 90- with 3 refills.    6. Con't Baclofen 10 mg 3x/day as needed- usually takes 2x/day- has refills   7. Patient here for trigger point injections for  Consent done and on chart.  Cleaned areas with alcohol and injected using a 27 gauge 1.5 inch needle  Injected 4.5 cc- wasted 1.5cc Using 1% Lidocaine with no EPI  Upper traps B/L  Levators- B/L  Posterior scalenes Middle scalenes- B/L  Splenius Capitus- B/L  Pectoralis Major- B/L  Rhomboids- B/L  Infraspinatus Teres Major/minor- B/L  Thoracic paraspinals Lumbar paraspinals Other injections- deltoids a    There was no bleeding or complications.  Patient was advised to drink a lot of water on day after injections to flush system Will have increased soreness for 12-48 hours after injections.  Can use Lidocaine patches the day AFTER injections Can use theracane on day of injections in places didn't inject Can use heating pad 4-6 hours AFTER injections   8. F/U in 3 months- double appt- SCI and trp injections.

## 2023-04-03 LAB — DRUG TOX MONITOR 1 W/CONF, ORAL FLD
Amphetamines: NEGATIVE ng/mL (ref <10)
Barbiturates: NEGATIVE ng/mL (ref <10)
Benzodiazepines: NEGATIVE ng/mL (ref <0.50)
Benzoylecgonine: NEGATIVE ng/mL (ref <5.0)
Buprenorphine: NEGATIVE ng/mL (ref <0.10)
Cocaine: 6.2 ng/mL — ABNORMAL HIGH (ref <5.0)
Cocaine: POSITIVE ng/mL — AB (ref <5.0)
Fentanyl: NEGATIVE ng/mL (ref <0.10)
Heroin Metabolite: NEGATIVE ng/mL (ref <1.0)
MARIJUANA: NEGATIVE ng/mL (ref <2.5)
MDMA: NEGATIVE ng/mL (ref <10)
Meprobamate: NEGATIVE ng/mL (ref <2.5)
Methadone: NEGATIVE ng/mL (ref <5.0)
Nicotine Metabolite: NEGATIVE ng/mL (ref <5.0)
Opiates: NEGATIVE ng/mL (ref <2.5)
Phencyclidine: NEGATIVE ng/mL (ref <10)
Tapentadol: NEGATIVE ng/mL (ref <5.0)
Tramadol: NEGATIVE ng/mL (ref <5.0)
Zolpidem: NEGATIVE ng/mL (ref <5.0)

## 2023-04-03 LAB — DRUG TOX ALC METAB W/CON, ORAL FLD: Alcohol Metabolite: NEGATIVE ng/mL (ref <25)

## 2023-04-15 ENCOUNTER — Encounter (HOSPITAL_BASED_OUTPATIENT_CLINIC_OR_DEPARTMENT_OTHER): Payer: 59 | Attending: Internal Medicine | Admitting: Internal Medicine

## 2023-04-15 DIAGNOSIS — M4628 Osteomyelitis of vertebra, sacral and sacrococcygeal region: Secondary | ICD-10-CM | POA: Insufficient documentation

## 2023-04-15 DIAGNOSIS — S31501A Unspecified open wound of unspecified external genital organs, male, initial encounter: Secondary | ICD-10-CM | POA: Diagnosis not present

## 2023-04-15 DIAGNOSIS — L89154 Pressure ulcer of sacral region, stage 4: Secondary | ICD-10-CM | POA: Diagnosis not present

## 2023-04-15 DIAGNOSIS — G8220 Paraplegia, unspecified: Secondary | ICD-10-CM | POA: Insufficient documentation

## 2023-04-15 DIAGNOSIS — E11622 Type 2 diabetes mellitus with other skin ulcer: Secondary | ICD-10-CM | POA: Diagnosis present

## 2023-04-15 DIAGNOSIS — G8221 Paraplegia, complete: Secondary | ICD-10-CM | POA: Diagnosis not present

## 2023-04-15 DIAGNOSIS — L89312 Pressure ulcer of right buttock, stage 2: Secondary | ICD-10-CM | POA: Diagnosis not present

## 2023-04-24 ENCOUNTER — Other Ambulatory Visit: Payer: Self-pay | Admitting: Physical Medicine and Rehabilitation

## 2023-05-27 ENCOUNTER — Encounter (HOSPITAL_BASED_OUTPATIENT_CLINIC_OR_DEPARTMENT_OTHER): Attending: Internal Medicine | Admitting: Internal Medicine

## 2023-05-27 DIAGNOSIS — S31501A Unspecified open wound of unspecified external genital organs, male, initial encounter: Secondary | ICD-10-CM | POA: Insufficient documentation

## 2023-05-27 DIAGNOSIS — L89154 Pressure ulcer of sacral region, stage 4: Secondary | ICD-10-CM | POA: Diagnosis present

## 2023-05-27 DIAGNOSIS — E1169 Type 2 diabetes mellitus with other specified complication: Secondary | ICD-10-CM | POA: Diagnosis not present

## 2023-05-27 DIAGNOSIS — M4628 Osteomyelitis of vertebra, sacral and sacrococcygeal region: Secondary | ICD-10-CM | POA: Diagnosis not present

## 2023-05-27 DIAGNOSIS — X58XXXA Exposure to other specified factors, initial encounter: Secondary | ICD-10-CM | POA: Insufficient documentation

## 2023-05-27 DIAGNOSIS — G8220 Paraplegia, unspecified: Secondary | ICD-10-CM | POA: Insufficient documentation

## 2023-05-27 DIAGNOSIS — L89312 Pressure ulcer of right buttock, stage 2: Secondary | ICD-10-CM | POA: Diagnosis not present

## 2023-06-09 ENCOUNTER — Encounter (HOSPITAL_BASED_OUTPATIENT_CLINIC_OR_DEPARTMENT_OTHER): Admitting: Internal Medicine

## 2023-06-17 ENCOUNTER — Ambulatory Visit (HOSPITAL_BASED_OUTPATIENT_CLINIC_OR_DEPARTMENT_OTHER): Admitting: Internal Medicine

## 2023-06-27 ENCOUNTER — Encounter: Payer: 59 | Attending: Physical Medicine and Rehabilitation | Admitting: Physical Medicine and Rehabilitation

## 2023-06-27 ENCOUNTER — Encounter: Payer: Self-pay | Admitting: Physical Medicine and Rehabilitation

## 2023-06-27 VITALS — BP 100/66 | HR 73

## 2023-06-27 DIAGNOSIS — G8250 Quadriplegia, unspecified: Secondary | ICD-10-CM

## 2023-06-27 DIAGNOSIS — R252 Cramp and spasm: Secondary | ICD-10-CM

## 2023-06-27 DIAGNOSIS — L89154 Pressure ulcer of sacral region, stage 4: Secondary | ICD-10-CM | POA: Diagnosis present

## 2023-06-27 DIAGNOSIS — M7918 Myalgia, other site: Secondary | ICD-10-CM | POA: Diagnosis present

## 2023-06-27 DIAGNOSIS — Z993 Dependence on wheelchair: Secondary | ICD-10-CM | POA: Diagnosis present

## 2023-06-27 MED ORDER — LIDOCAINE HCL 1 % IJ SOLN
5.0000 mL | Freq: Once | INTRAMUSCULAR | Status: AC
Start: 2023-06-27 — End: 2023-06-27
  Administered 2023-06-27: 5 mL

## 2023-06-27 MED ORDER — BACLOFEN 20 MG PO TABS: 20.0000 mg | ORAL_TABLET | Freq: Three times a day (TID) | ORAL | 1 refills | Status: DC

## 2023-06-27 NOTE — Progress Notes (Signed)
 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   On Baclofen - 10 mg TID- but only give 2x/day.  Said we discussed earlier,  to do 2x/day.  Gets spasms whenever touching him or changing clothes.   Sleeps a lot- sleeps more than is awake,     Exam:  Awake, alert, appropriate, not taking much today, accompanied by wife and interpretor, NAD  Has  Chronic foley-  Sustained spasms with raising legs up to 90 degrees- MAS of 2 in LE's- B/L    Plan: Will increase Baclofen  to 15 mg  (1/5 pills) 3x/day for 1 week, then increase to 20 mg (a new pill sent into pharmacy)- 20 mg 3x/day-  call me in 3 weeks to let me know if spasms controlled, or I need to increase meds.   2. When has surgery, will need Valium-  5 mg 2-3x/day-  for after surgery- will make him sleepy- but will be needed to get spasms under better control. Please let Dr Lovella Rubin know- take this paperwork to Dr [facility] appointment.    3.  Had Cocaine (+) in  last urine drug screen- which has never occurred before -  admits was used- explained I cannot write for Norco/Hydrocodone   anymore per clinic and legal policy. Was positive for Cocaine on 03/26/23 Oral drug screen.   4.  Using cocaine increases your risk of a stroke- and having a Spinal cord injury, and using cocaine, increases your risk even more- please don't use again.    5. Patient here for trigger point injections for  Consent done and on chart.  Cleaned areas with alcohol  and injected using a 27 gauge 1.5 inch  needle  Injected  5cc- none wasted Using 1% Lidocaine  with no EPI  Upper traps B/L  Levators- B/L  Posterior scalenes Middle scalenes- b/L  Splenius Capitus- b/L  Pectoralis Major- b/L  Rhomboids Infraspinatus Teres Major/minor Thoracic paraspinals Lumbar paraspinals Other injections- deltoids-     There was no bleeding or complications.  Patient was advised to drink a lot of water on day after injections to flush system Will have increased soreness for 12-48 hours after injections.  Can use Lidocaine  patches the day AFTER injections Can use theracane on day of injections in places didn't inject Can use heating pad 4-6 hours AFTER injections    6. F/U in 3 months- double appt- SCI- and trp injections   I spent a total of  35  minutes on total care today- >50% coordination of care- due to 5 minute son injections- rest discussing (+) oral drug screen and f/u on spasticity due to needing surgery for pressure stage IV ulcer- d/w surgery and plan. Will need SNF after surgery

## 2023-06-27 NOTE — Patient Instructions (Signed)
 Plan: Will increase Baclofen  to 15 mg  (1/5 pills) 3x/day for 1 week, then increase to 20 mg (a new pill sent into pharmacy)- 20 mg 3x/day-  call me in 3 weeks to let me know if spasms controlled, or I need to increase meds.   2. When has surgery, will need Valium-  5 mg 2-3x/day-  for after surgery- will make him sleepy- but will be needed to get spasms under better control. Please let Dr Lovella Rubin know- take this paperwork to Dr [facility] appointment.    3.  Had Cocaine (+) in  last urine drug screen- which has never occurred before -  admits was used- explained I cannot write for Norco/Hydrocodone   anymore per clinic and legal policy. Was positive for Cocaine on 03/26/23 Oral drug screen.   4.  Using cocaine increases your risk of a stroke- and having a Spinal cord injury, and using cocaine, increases your risk even more- please don't use again.    5. Patient here for trigger point injections for  Consent done and on chart.  Cleaned areas with alcohol  and injected using a 27 gauge 1.5 inch needle  Injected  5cc- none wasted Using 1% Lidocaine  with no EPI  Upper traps B/L  Levators- B/L  Posterior scalenes Middle scalenes- b/L  Splenius Capitus- b/L  Pectoralis Major- b/L  Rhomboids Infraspinatus Teres Major/minor Thoracic paraspinals Lumbar paraspinals Other injections- deltoids-     There was no bleeding or complications.  Patient was advised to drink a lot of water on day after injections to flush system Will have increased soreness for 12-48 hours after injections.  Can use Lidocaine  patches the day AFTER injections Can use theracane on day of injections in places didn't inject Can use heating pad 4-6 hours AFTER injections    6. F/U in 3months- double appt- SCI- and trp injections

## 2023-07-01 ENCOUNTER — Encounter (HOSPITAL_BASED_OUTPATIENT_CLINIC_OR_DEPARTMENT_OTHER): Attending: Internal Medicine | Admitting: Internal Medicine

## 2023-07-01 DIAGNOSIS — G8221 Paraplegia, complete: Secondary | ICD-10-CM | POA: Insufficient documentation

## 2023-07-01 DIAGNOSIS — M4628 Osteomyelitis of vertebra, sacral and sacrococcygeal region: Secondary | ICD-10-CM | POA: Diagnosis not present

## 2023-07-01 DIAGNOSIS — L89154 Pressure ulcer of sacral region, stage 4: Secondary | ICD-10-CM | POA: Insufficient documentation

## 2023-07-01 DIAGNOSIS — E11622 Type 2 diabetes mellitus with other skin ulcer: Secondary | ICD-10-CM

## 2023-07-11 ENCOUNTER — Inpatient Hospital Stay (HOSPITAL_COMMUNITY): Admitting: Registered Nurse

## 2023-07-11 ENCOUNTER — Emergency Department (HOSPITAL_COMMUNITY)

## 2023-07-11 ENCOUNTER — Encounter (HOSPITAL_COMMUNITY)
Admission: EM | Disposition: A | Discharge status: Home Health | Payer: Self-pay | Source: Home / Self Care | Attending: Internal Medicine

## 2023-07-11 ENCOUNTER — Inpatient Hospital Stay (HOSPITAL_COMMUNITY)

## 2023-07-11 ENCOUNTER — Inpatient Hospital Stay (HOSPITAL_COMMUNITY)
Admission: EM | Admit: 2023-07-11 | Discharge: 2023-07-15 | DRG: 659 | Disposition: A | Discharge status: Home Health | Attending: Family Medicine | Admitting: Family Medicine

## 2023-07-11 ENCOUNTER — Encounter (HOSPITAL_COMMUNITY): Payer: Self-pay | Admitting: Internal Medicine

## 2023-07-11 DIAGNOSIS — Z79899 Other long term (current) drug therapy: Secondary | ICD-10-CM | POA: Diagnosis not present

## 2023-07-11 DIAGNOSIS — Z23 Encounter for immunization: Secondary | ICD-10-CM

## 2023-07-11 DIAGNOSIS — E119 Type 2 diabetes mellitus without complications: Secondary | ICD-10-CM | POA: Diagnosis not present

## 2023-07-11 DIAGNOSIS — E1169 Type 2 diabetes mellitus with other specified complication: Secondary | ICD-10-CM | POA: Diagnosis present

## 2023-07-11 DIAGNOSIS — N179 Acute kidney failure, unspecified: Secondary | ICD-10-CM | POA: Diagnosis present

## 2023-07-11 DIAGNOSIS — N201 Calculus of ureter: Principal | ICD-10-CM

## 2023-07-11 DIAGNOSIS — I10 Essential (primary) hypertension: Secondary | ICD-10-CM | POA: Diagnosis present

## 2023-07-11 DIAGNOSIS — G8250 Quadriplegia, unspecified: Secondary | ICD-10-CM | POA: Diagnosis present

## 2023-07-11 DIAGNOSIS — L89154 Pressure ulcer of sacral region, stage 4: Secondary | ICD-10-CM | POA: Diagnosis present

## 2023-07-11 DIAGNOSIS — T83511A Infection and inflammatory reaction due to indwelling urethral catheter, initial encounter: Secondary | ICD-10-CM | POA: Diagnosis present

## 2023-07-11 DIAGNOSIS — Y846 Urinary catheterization as the cause of abnormal reaction of the patient, or of later complication, without mention of misadventure at the time of the procedure: Secondary | ICD-10-CM | POA: Diagnosis present

## 2023-07-11 DIAGNOSIS — Z7901 Long term (current) use of anticoagulants: Secondary | ICD-10-CM | POA: Diagnosis not present

## 2023-07-11 DIAGNOSIS — Z6839 Body mass index (BMI) 39.0-39.9, adult: Secondary | ICD-10-CM | POA: Diagnosis not present

## 2023-07-11 DIAGNOSIS — D638 Anemia in other chronic diseases classified elsewhere: Secondary | ICD-10-CM | POA: Diagnosis present

## 2023-07-11 DIAGNOSIS — Z7984 Long term (current) use of oral hypoglycemic drugs: Secondary | ICD-10-CM

## 2023-07-11 DIAGNOSIS — K5900 Constipation, unspecified: Secondary | ICD-10-CM | POA: Diagnosis not present

## 2023-07-11 DIAGNOSIS — M4628 Osteomyelitis of vertebra, sacral and sacrococcygeal region: Secondary | ICD-10-CM | POA: Diagnosis present

## 2023-07-11 DIAGNOSIS — N319 Neuromuscular dysfunction of bladder, unspecified: Secondary | ICD-10-CM | POA: Diagnosis present

## 2023-07-11 DIAGNOSIS — N136 Pyonephrosis: Secondary | ICD-10-CM | POA: Diagnosis present

## 2023-07-11 DIAGNOSIS — R6520 Severe sepsis without septic shock: Secondary | ICD-10-CM | POA: Diagnosis present

## 2023-07-11 DIAGNOSIS — N12 Tubulo-interstitial nephritis, not specified as acute or chronic: Secondary | ICD-10-CM

## 2023-07-11 DIAGNOSIS — N132 Hydronephrosis with renal and ureteral calculous obstruction: Secondary | ICD-10-CM

## 2023-07-11 DIAGNOSIS — E1165 Type 2 diabetes mellitus with hyperglycemia: Secondary | ICD-10-CM | POA: Diagnosis present

## 2023-07-11 DIAGNOSIS — A419 Sepsis, unspecified organism: Secondary | ICD-10-CM | POA: Diagnosis not present

## 2023-07-11 DIAGNOSIS — Z794 Long term (current) use of insulin: Secondary | ICD-10-CM

## 2023-07-11 DIAGNOSIS — Z86718 Personal history of other venous thrombosis and embolism: Secondary | ICD-10-CM

## 2023-07-11 DIAGNOSIS — G8220 Paraplegia, unspecified: Secondary | ICD-10-CM | POA: Diagnosis present

## 2023-07-11 DIAGNOSIS — Z8679 Personal history of other diseases of the circulatory system: Secondary | ICD-10-CM

## 2023-07-11 DIAGNOSIS — Z87442 Personal history of urinary calculi: Secondary | ICD-10-CM

## 2023-07-11 DIAGNOSIS — N200 Calculus of kidney: Secondary | ICD-10-CM

## 2023-07-11 DIAGNOSIS — I959 Hypotension, unspecified: Secondary | ICD-10-CM | POA: Diagnosis present

## 2023-07-11 DIAGNOSIS — A4151 Sepsis due to Escherichia coli [E. coli]: Secondary | ICD-10-CM | POA: Diagnosis present

## 2023-07-11 DIAGNOSIS — E872 Acidosis, unspecified: Secondary | ICD-10-CM | POA: Diagnosis present

## 2023-07-11 HISTORY — PX: CYSTOSCOPY W/ URETERAL STENT PLACEMENT: SHX1429

## 2023-07-11 LAB — I-STAT CG4 LACTIC ACID, ED
Lactic Acid, Venous: 1.1 mmol/L (ref 0.5–1.9)
Lactic Acid, Venous: 2.7 mmol/L (ref 0.5–1.9)

## 2023-07-11 LAB — COMPREHENSIVE METABOLIC PANEL WITH GFR
ALT: 11 U/L (ref 0–44)
AST: 14 U/L — ABNORMAL LOW (ref 15–41)
Albumin: 2.8 g/dL — ABNORMAL LOW (ref 3.5–5.0)
Alkaline Phosphatase: 71 U/L (ref 38–126)
Anion gap: 11 (ref 5–15)
BUN: 32 mg/dL — ABNORMAL HIGH (ref 6–20)
CO2: 20 mmol/L — ABNORMAL LOW (ref 22–32)
Calcium: 7.9 mg/dL — ABNORMAL LOW (ref 8.9–10.3)
Chloride: 102 mmol/L (ref 98–111)
Creatinine, Ser: 2.4 mg/dL — ABNORMAL HIGH (ref 0.61–1.24)
GFR, Estimated: 31 mL/min — ABNORMAL LOW (ref >60)
Glucose, Bld: 239 mg/dL — ABNORMAL HIGH (ref 70–99)
Potassium: 4.1 mmol/L (ref 3.5–5.1)
Sodium: 133 mmol/L — ABNORMAL LOW (ref 135–145)
Total Bilirubin: 0.9 mg/dL (ref 0.0–1.2)
Total Protein: 6.8 g/dL (ref 6.5–8.1)

## 2023-07-11 LAB — URINALYSIS, W/ REFLEX TO CULTURE (INFECTION SUSPECTED)
Bilirubin Urine: NEGATIVE
Glucose, UA: NEGATIVE mg/dL
Ketones, ur: NEGATIVE mg/dL
Nitrite: NEGATIVE
Protein, ur: 100 mg/dL — AB
Specific Gravity, Urine: 1.027 (ref 1.005–1.030)
WBC, UA: >50 WBC/hpf (ref 0–5)
pH: 5 (ref 5.0–8.0)

## 2023-07-11 LAB — CBC WITH DIFFERENTIAL/PLATELET
Abs Immature Granulocytes: 0.19 10*3/uL — ABNORMAL HIGH (ref 0.00–0.07)
Basophils Absolute: 0.1 10*3/uL (ref 0.0–0.1)
Basophils Relative: 0 %
Eosinophils Absolute: 0 10*3/uL (ref 0.0–0.5)
Eosinophils Relative: 0 %
HCT: 32.2 % — ABNORMAL LOW (ref 39.0–52.0)
Hemoglobin: 10.2 g/dL — ABNORMAL LOW (ref 13.0–17.0)
Immature Granulocytes: 1 %
Lymphocytes Relative: 8 %
Lymphs Abs: 1.4 10*3/uL (ref 0.7–4.0)
MCH: 26.3 pg (ref 26.0–34.0)
MCHC: 31.7 g/dL (ref 30.0–36.0)
MCV: 83 fL (ref 80.0–100.0)
Monocytes Absolute: 0.8 10*3/uL (ref 0.1–1.0)
Monocytes Relative: 4 %
Neutro Abs: 16.5 10*3/uL — ABNORMAL HIGH (ref 1.7–7.7)
Neutrophils Relative %: 87 %
Platelets: 239 10*3/uL (ref 150–400)
RBC: 3.88 MIL/uL — ABNORMAL LOW (ref 4.22–5.81)
RDW: 15.9 % — ABNORMAL HIGH (ref 11.5–15.5)
Smear Review: NORMAL
WBC: 19 10*3/uL — ABNORMAL HIGH (ref 4.0–10.5)
nRBC: 0 % (ref 0.0–0.2)

## 2023-07-11 LAB — MAGNESIUM: Magnesium: 1.4 mg/dL — ABNORMAL LOW (ref 1.7–2.4)

## 2023-07-11 LAB — TYPE AND SCREEN
ABO/RH(D): O POS
Antibody Screen: NEGATIVE

## 2023-07-11 LAB — HEMOGLOBIN A1C
Hgb A1c MFr Bld: 8.1 % — ABNORMAL HIGH (ref 4.8–5.6)
Mean Plasma Glucose: 185.77 mg/dL

## 2023-07-11 LAB — PROTIME-INR
INR: 2.1 — ABNORMAL HIGH (ref 0.8–1.2)
Prothrombin Time: 23.5 s — ABNORMAL HIGH (ref 11.4–15.2)

## 2023-07-11 LAB — GLUCOSE, CAPILLARY: Glucose-Capillary: 155 mg/dL — ABNORMAL HIGH (ref 70–99)

## 2023-07-11 LAB — LACTIC ACID, PLASMA: Lactic Acid, Venous: 1.1 mmol/L (ref 0.5–1.9)

## 2023-07-11 SURGERY — CYSTOSCOPY, WITH RETROGRADE PYELOGRAM AND URETERAL STENT INSERTION
Anesthesia: General | Site: Ureter | Laterality: Left

## 2023-07-11 MED ORDER — LACTATED RINGERS IV SOLN: INTRAVENOUS | Status: DC

## 2023-07-11 MED ORDER — INSULIN ASPART 100 UNIT/ML IJ SOLN
0.0000 [IU] | Freq: Four times a day (QID) | INTRAMUSCULAR | Status: DC
  Administered 2023-07-12: 1 [IU] via SUBCUTANEOUS
  Administered 2023-07-12: 3 [IU] via SUBCUTANEOUS

## 2023-07-11 MED ORDER — LIDOCAINE 2% (20 MG/ML) 5 ML SYRINGE
INTRAMUSCULAR | Status: DC | PRN
  Administered 2023-07-11: 40 mg via INTRAVENOUS

## 2023-07-11 MED ORDER — IOHEXOL 300 MG/ML  SOLN
INTRAMUSCULAR | Status: DC | PRN
  Administered 2023-07-11: 5 mL

## 2023-07-11 MED ORDER — VANCOMYCIN VARIABLE DOSE PER UNSTABLE RENAL FUNCTION (PHARMACIST DOSING): Status: DC

## 2023-07-11 MED ORDER — ACETAMINOPHEN 650 MG RE SUPP: 650.0000 mg | Freq: Four times a day (QID) | RECTAL | Status: DC | PRN

## 2023-07-11 MED ORDER — SODIUM CHLORIDE 0.9 % IV SOLN
2.0000 g | Freq: Once | INTRAVENOUS | Status: AC
  Administered 2023-07-11: 2 g via INTRAVENOUS
  Filled 2023-07-11: qty 20

## 2023-07-11 MED ORDER — ACETAMINOPHEN 325 MG PO TABS
650.0000 mg | ORAL_TABLET | Freq: Four times a day (QID) | ORAL | Status: DC | PRN
  Administered 2023-07-13: 650 mg via ORAL
  Filled 2023-07-11: qty 2

## 2023-07-11 MED ORDER — ACETAMINOPHEN 10 MG/ML IV SOLN
INTRAVENOUS | Status: DC | PRN
  Administered 2023-07-11: 1000 mg via INTRAVENOUS

## 2023-07-11 MED ORDER — METRONIDAZOLE 500 MG/100ML IV SOLN
500.0000 mg | Freq: Two times a day (BID) | INTRAVENOUS | Status: DC
  Administered 2023-07-12: 500 mg via INTRAVENOUS
  Filled 2023-07-11 (×2): qty 100

## 2023-07-11 MED ORDER — LACTATED RINGERS IV BOLUS
1000.0000 mL | Freq: Once | INTRAVENOUS | Status: AC
  Administered 2023-07-11: 1000 mL via INTRAVENOUS

## 2023-07-11 MED ORDER — SODIUM CHLORIDE 0.9 % IV BOLUS
1000.0000 mL | Freq: Once | INTRAVENOUS | Status: AC
  Administered 2023-07-11: 1000 mL via INTRAVENOUS

## 2023-07-11 MED ORDER — PHENYLEPHRINE 80 MCG/ML (10ML) SYRINGE FOR IV PUSH (FOR BLOOD PRESSURE SUPPORT)
PREFILLED_SYRINGE | INTRAVENOUS | Status: AC
  Filled 2023-07-11: qty 10

## 2023-07-11 MED ORDER — ALBUMIN HUMAN 5 % IV SOLN
12.5000 g | Freq: Once | INTRAVENOUS | Status: AC
  Administered 2023-07-11: 12.5 g via INTRAVENOUS

## 2023-07-11 MED ORDER — SODIUM CHLORIDE 0.9 % IV SOLN
2.0000 g | Freq: Two times a day (BID) | INTRAVENOUS | Status: DC
  Administered 2023-07-12: 2 g via INTRAVENOUS
  Filled 2023-07-11: qty 12.5

## 2023-07-11 MED ORDER — DEXAMETHASONE SODIUM PHOSPHATE 10 MG/ML IJ SOLN
INTRAMUSCULAR | Status: DC | PRN
  Administered 2023-07-11: 5 mg via INTRAVENOUS

## 2023-07-11 MED ORDER — PROPOFOL 10 MG/ML IV BOLUS
INTRAVENOUS | Status: AC
  Filled 2023-07-11: qty 20

## 2023-07-11 MED ORDER — ALBUMIN HUMAN 5 % IV SOLN
INTRAVENOUS | Status: AC
Start: 2023-07-11 — End: ?
  Filled 2023-07-11: qty 250

## 2023-07-11 MED ORDER — ALBUMIN HUMAN 5 % IV SOLN: INTRAVENOUS | Status: DC | PRN

## 2023-07-11 MED ORDER — FENTANYL CITRATE (PF) 100 MCG/2ML IJ SOLN: 25.0000 ug | INTRAMUSCULAR | Status: DC | PRN

## 2023-07-11 MED ORDER — SUGAMMADEX SODIUM 200 MG/2ML IV SOLN
INTRAVENOUS | Status: DC | PRN
  Administered 2023-07-11 (×2): 100 mg via INTRAVENOUS

## 2023-07-11 MED ORDER — PHENYLEPHRINE 80 MCG/ML (10ML) SYRINGE FOR IV PUSH (FOR BLOOD PRESSURE SUPPORT)
PREFILLED_SYRINGE | INTRAVENOUS | Status: DC | PRN
  Administered 2023-07-11 (×6): 160 ug via INTRAVENOUS

## 2023-07-11 MED ORDER — PROPOFOL 10 MG/ML IV BOLUS
INTRAVENOUS | Status: DC | PRN
  Administered 2023-07-11: 70 mg via INTRAVENOUS

## 2023-07-11 MED ORDER — LACTATED RINGERS IV SOLN: INTRAVENOUS | Status: DC | PRN

## 2023-07-11 MED ORDER — FENTANYL CITRATE PF 50 MCG/ML IJ SOSY: 25.0000 ug | PREFILLED_SYRINGE | INTRAMUSCULAR | Status: DC | PRN

## 2023-07-11 MED ORDER — FENTANYL CITRATE (PF) 250 MCG/5ML IJ SOLN
INTRAMUSCULAR | Status: AC
  Filled 2023-07-11: qty 5

## 2023-07-11 MED ORDER — VANCOMYCIN HCL 2000 MG/400ML IV SOLN
2000.0000 mg | Freq: Once | INTRAVENOUS | Status: AC
  Administered 2023-07-12: 2000 mg via INTRAVENOUS
  Filled 2023-07-11 (×2): qty 400

## 2023-07-11 MED ORDER — HYDROMORPHONE HCL 1 MG/ML IJ SOLN: 0.5000 mg | Freq: Once | INTRAMUSCULAR | Status: DC

## 2023-07-11 MED ORDER — NALOXONE HCL 0.4 MG/ML IJ SOLN: 0.4000 mg | INTRAMUSCULAR | Status: DC | PRN

## 2023-07-11 MED ORDER — FENTANYL CITRATE (PF) 250 MCG/5ML IJ SOLN
INTRAMUSCULAR | Status: DC | PRN
Start: 2023-07-11 — End: 2023-07-11
  Administered 2023-07-11: 50 ug via INTRAVENOUS

## 2023-07-11 MED ORDER — DROPERIDOL 2.5 MG/ML IJ SOLN: 0.6250 mg | Freq: Once | INTRAMUSCULAR | Status: DC | PRN

## 2023-07-11 MED ORDER — ONDANSETRON HCL 4 MG/2ML IJ SOLN
INTRAMUSCULAR | Status: AC
Start: 2023-07-11 — End: ?
  Filled 2023-07-11: qty 2

## 2023-07-11 MED ORDER — ACETAMINOPHEN 10 MG/ML IV SOLN: 1000.0000 mg | Freq: Once | INTRAVENOUS | Status: DC | PRN

## 2023-07-11 MED ORDER — NOREPINEPHRINE 4 MG/250ML-% IV SOLN: 0.0000 ug/min | INTRAVENOUS | Status: DC

## 2023-07-11 MED ORDER — ROCURONIUM BROMIDE 10 MG/ML (PF) SYRINGE
PREFILLED_SYRINGE | INTRAVENOUS | Status: AC
  Filled 2023-07-11: qty 10

## 2023-07-11 MED ORDER — OXYCODONE HCL 5 MG/5ML PO SOLN: 5.0000 mg | Freq: Once | ORAL | Status: DC | PRN

## 2023-07-11 MED ORDER — DEXAMETHASONE SODIUM PHOSPHATE 10 MG/ML IJ SOLN
INTRAMUSCULAR | Status: AC
  Filled 2023-07-11: qty 1

## 2023-07-11 MED ORDER — INSULIN GLARGINE-YFGN 100 UNIT/ML ~~LOC~~ SOLN
10.0000 [IU] | Freq: Every day | SUBCUTANEOUS | Status: DC
  Filled 2023-07-11 (×2): qty 0.1

## 2023-07-11 MED ORDER — ONDANSETRON HCL 4 MG/2ML IJ SOLN
INTRAMUSCULAR | Status: DC | PRN
  Administered 2023-07-11: 4 mg via INTRAVENOUS

## 2023-07-11 MED ORDER — ROCURONIUM BROMIDE 10 MG/ML (PF) SYRINGE
PREFILLED_SYRINGE | INTRAVENOUS | Status: DC | PRN
  Administered 2023-07-11: 40 mg via INTRAVENOUS

## 2023-07-11 MED ORDER — MIDAZOLAM HCL 2 MG/2ML IJ SOLN
INTRAMUSCULAR | Status: AC
  Filled 2023-07-11: qty 2

## 2023-07-11 MED ORDER — OXYCODONE HCL 5 MG PO TABS: 5.0000 mg | ORAL_TABLET | Freq: Once | ORAL | Status: DC | PRN

## 2023-07-11 MED ORDER — ONDANSETRON HCL 4 MG/2ML IJ SOLN: 4.0000 mg | Freq: Four times a day (QID) | INTRAMUSCULAR | Status: DC | PRN

## 2023-07-11 MED ORDER — LIDOCAINE 2% (20 MG/ML) 5 ML SYRINGE
INTRAMUSCULAR | Status: AC
  Filled 2023-07-11: qty 5

## 2023-07-11 SURGICAL SUPPLY — 22 items
BAG URO CATCHER STRL LF (MISCELLANEOUS) ×1 IMPLANT
BASKET ZERO TIP NITINOL 2.4FR (BASKET) IMPLANT
CATH URETL OPEN 5X70 (CATHETERS) ×1 IMPLANT
CLOTH BEACON ORANGE TIMEOUT ST (SAFETY) ×1 IMPLANT
DRSG TEGADERM 4X4.75 (GAUZE/BANDAGES/DRESSINGS) IMPLANT
EXTRACTOR STONE 1.7FRX115CM (UROLOGICAL SUPPLIES) IMPLANT
GLOVE BIO SURGEON STRL SZ 6.5 (GLOVE) ×1 IMPLANT
GOWN STRL REUS W/ TWL LRG LVL3 (GOWN DISPOSABLE) ×1 IMPLANT
GUIDEWIRE ANG ZIPWIRE 035X150 (WIRE) IMPLANT
GUIDEWIRE STR DUAL SENSOR (WIRE) ×1 IMPLANT
KIT TURNOVER KIT B (KITS) ×1 IMPLANT
MANIFOLD NEPTUNE II (INSTRUMENTS) ×1 IMPLANT
PACK CYSTO (CUSTOM PROCEDURE TRAY) ×1 IMPLANT
SHEATH NAVIGATOR HD 11/13X28 (SHEATH) IMPLANT
SHEATH NAVIGATOR HD 11/13X36 (SHEATH) IMPLANT
SHEATH NAVIGATOR HD 12/14X46 (SHEATH) IMPLANT
SLEEVE SCD COMPRESS KNEE MED (STOCKING) ×1 IMPLANT
SOL .9 NS 3000ML IRR UROMATIC (IV SOLUTION) ×1 IMPLANT
STENT URET 6FRX26 CONTOUR (STENTS) IMPLANT
TRAY FOLEY W/BAG SLVR 16FR ST (SET/KITS/TRAYS/PACK) IMPLANT
TUBE CONNECTING 12X1/4 (SUCTIONS) ×1 IMPLANT
TUBING UROLOGY SET (TUBING) ×1 IMPLANT

## 2023-07-11 NOTE — ED Triage Notes (Signed)
 PT also has sacral pressure ulcer that has been there for over a year but appears to be getting deeper. In the past when wound has been infected pt gets head and neck pain. Also states decreased urine output in foley.

## 2023-07-11 NOTE — Anesthesia Procedure Notes (Signed)
 Procedure Name: Intubation Date/Time: 07/11/2023 8:32 PM  Performed by: Willian Harrow, MDPre-anesthesia Checklist: Patient identified, Emergency Drugs available, Suction available and Patient being monitored Patient Re-evaluated:Patient Re-evaluated prior to induction Oxygen Delivery Method: Circle system utilized Preoxygenation: Pre-oxygenation with 100% oxygen Induction Type: IV induction Ventilation: Mask ventilation without difficulty Laryngoscope Size: Glidescope and 4 Grade View: Grade III Tube type: Oral Tube size: 7.5 mm Number of attempts: 1 Airway Equipment and Method: Stylet and Oral airway Placement Confirmation: ETT inserted through vocal cords under direct vision, positive ETCO2 and breath sounds checked- equal and bilateral Secured at: 22 cm Tube secured with: Tape Dental Injury: Teeth and Oropharynx as per pre-operative assessment  Difficulty Due To: Difficulty was anticipated Comments: Recommend Glidescope for all future intubations.

## 2023-07-11 NOTE — H&P (View-Only) (Signed)
 Urology Consult Note   Requesting Attending Physician:  Roxana Copier, DO Service Providing Consult: Urology  Consulting Attending: Perley Bradley, MD   Reason for Consult:  septic stone  HPI: [name] is seen in consultation for reasons noted above at the request of Howerter, Justin B, DO  for evaluation of septic stone.  This is a 58 y.o. male with a history of quadriplegia, HTN, T2DM, and nephrolithiasis who presented to the ED with fevers and chills as well as neck and back pain. ED workup notable for Tmax 101.8, BP 98/64, HR 98. Labs show cr 2.4 (baseline 0.62), leukocytosis to 19, LA 2.7, UA with negative nitrite, >50 WBC, 21-50 RBCs.   CT obtained showing a 4mm distal left ureteral stone with associated left hydronephrosis and stranding. 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 Secundino Dach in clinic in November 2024 and planned for observation. There is no evidence of staghorn on today's CT and the patient denies ever having any kidney stone procedures. He has a neurogenic bladder managed with indwelling urethral catheter changed monthly by his wife. Takes eliquis  for history of DVT.    On my exam he is rigoring and appears unwell. We discussed plans for urgent left ureteral stent placement, followed by admission to hospitalist service for abx and AKI management. He also has concern for osteomyelitis of his sacral region on CT, so the ED has consulted general surgery.    Past Medical History: Past Medical History:  Diagnosis Date   Diabetes mellitus without complication (HCC)    Dyspnea    History of cervical fracture    Hypertension    Paralysis (HCC)    BLE    Past Surgical History:  Past Surgical History:  Procedure Laterality Date   AMPUTATION Left 08/10/2021   Procedure: LEFT 5TH RAY AMPUTATION;  Surgeon: Timothy Ford, MD;  Location: [facility] OR;  Service: Orthopedics;  Laterality: Left;   CERVICAL SPINE SURGERY     INCISION  AND DRAINAGE OF WOUND N/A 07/06/2022   Procedure: IRRIGATION AND DEBRIDEMENT SACRAL WOUND;  Surgeon: Dorena Gander, MD;  Location: [facility] OR;  Service: General;  Laterality: N/A;    Medication: Current Facility-Administered Medications  Medication Dose Route Frequency Provider Last Rate Last Admin   acetaminophen  (TYLENOL ) tablet 650 mg  650 mg Oral Q6H PRN Howerter, Justin B, DO       Or   acetaminophen  (TYLENOL ) suppository 650 mg  650 mg Rectal Q6H PRN Howerter, Justin B, DO       fentaNYL  (SUBLIMAZE ) injection 25 mcg  25 mcg Intravenous Q2H PRN Howerter, Justin B, DO       HYDROmorphone (DILAUDID) injection 0.5 mg  0.5 mg Intravenous Once Naasz, Hayley N, MD       [START ON 07/12/2023] insulin  aspart (novoLOG ) injection 0-6 Units  0-6 Units Subcutaneous Q6H Howerter, Justin B, DO       lactated ringers  infusion   Intravenous Continuous Howerter, Justin B, DO       metroNIDAZOLE  (FLAGYL ) IVPB 500 mg  500 mg Intravenous Q12H Howerter, Justin B, DO       naloxone (NARCAN) injection 0.4 mg  0.4 mg Intravenous PRN Howerter, Justin B, DO       ondansetron  (ZOFRAN ) injection 4 mg  4 mg Intravenous Q6H PRN Howerter, Justin B, DO       vancomycin  (VANCOREADY) IVPB 2000 mg/400 mL  2,000 mg Intravenous Once Merdis Stalling, MD  Current Outpatient Medications  Medication Sig Dispense Refill   acetaminophen  (TYLENOL ) 500 MG tablet Take 500 mg by mouth every 6 (six) hours as needed for mild pain.     apixaban  (ELIQUIS ) 5 MG TABS tablet Take 1 tablet by mouth 2 (two) times daily.     baclofen  (LIORESAL ) 10 MG tablet TAKE 1 TABLET BY MOUTH 3 TIMES DAILY AS NEEDED FOR MUSCLE SPASMS OR tightness 270 tablet 1   baclofen  (LIORESAL ) 20 MG tablet Take 1 tablet (20 mg total) by mouth 3 (three) times daily. 270 each 1   BISACODYL  LAXATIVE RE Place 1 suppository rectally at bedtime.     Carboxymethylcellulose Sodium (ARTIFICIAL TEARS OP) Apply 1-2 drops to eye daily as needed (dry eyes).      docusate sodium   (COLACE) 100 MG capsule Take 1 capsule (100 mg total) by mouth 2 (two) times daily for 5 days, then as directed by physician 100 capsule 0   FLUoxetine  (PROZAC ) 20 MG capsule Take 1 capsule (20 mg total) by mouth daily. 90 capsule 3   furosemide  (LASIX ) 40 MG tablet Take 1 tablet daily for 6 days, take as needed after that for swelling or weight gain 30 tablet 0   gabapentin  (NEURONTIN ) 600 MG tablet Take 1 tablet (600 mg total) by mouth 3 (three) times daily. 270 tablet 3   glipiZIDE  (GLUCOTROL ) 10 MG tablet Take 1 tablet by mouth 2 (two) times daily.     HYDROcodone -acetaminophen  (NORCO) 5-325 MG tablet Take 1 tablet by mouth every 6 (six) hours as needed for moderate pain (pain score 4-6). 30 tablet 0   insulin  glargine (LANTUS ) 100 UNIT/ML injection Inject 30 Units into the skin at bedtime.     lisinopril  (ZESTRIL ) 5 MG tablet Take by mouth.     ondansetron  (ZOFRAN ) 4 MG tablet Take 1 tablet (4 mg total) by mouth every 6 (six) hours as needed for nausea. 20 tablet 0   sildenafil  (VIAGRA ) 100 MG tablet Take by mouth.      Allergies: No Known Allergies  Social History: Social History   Tobacco Use   Smoking status: Never   Smokeless tobacco: Never  Vaping Use   Vaping status: Former  Substance Use Topics   Alcohol  use: Yes    Comment: social   Drug use: No    Family History Family History  Problem Relation Age of Onset   Diabetes Mother    Diabetes Father     Review of Systems 10 systems were reviewed and are negative except as noted specifically in the HPI.  Objective   Vital signs in last 24 hours: BP 98/74 (BP Location: Left Arm)   Pulse 88   Temp 98.7 F (37.1 C) (Oral)   Resp 18   SpO2 98%   Physical Exam General: NAD, A&O, resting, appropriate HEENT: /AT, EOMI, MMM Pulmonary: Normal work of breathing Cardiovascular: HDS, adequate peripheral perfusion Abdomen: Soft, NTTP, nondistended GU: Foley in place draining cloudy urine.  Extremities: warm and  well perfused Neuro: Appropriate, quadriplegic  Most Recent Labs: Lab Results  Component Value Date   WBC 19.0 (H) 07/11/2023   HGB 10.2 (L) 07/11/2023   HCT 32.2 (L) 07/11/2023   PLT 239 07/11/2023    Lab Results  Component Value Date   NA 133 (L) 07/11/2023   K 4.1 07/11/2023   CL 102 07/11/2023   CO2 20 (L) 07/11/2023   BUN 32 (H) 07/11/2023   CREATININE 2.40 (H) 07/11/2023   CALCIUM 7.9 (L)  07/11/2023   MG 1.9 10/03/2022   PHOS 3.2 07/12/2022    Lab Results  Component Value Date   INR 1.3 (H) 10/02/2022   APTT 46 (H) 07/06/2022     Urine Culture: @[id number] (laburin,org,[id number])@   IMAGING: CT ABDOMEN PELVIS WO CONTRAST Result Date: 07/11/2023 CLINICAL DATA:  Sepsis.  Neck pain. EXAM: CT ABDOMEN AND PELVIS WITHOUT CONTRAST TECHNIQUE: Multidetector CT imaging of the abdomen and pelvis was performed following the standard protocol without IV contrast. RADIATION DOSE REDUCTION: This exam was performed according to the departmental dose-optimization program which includes automated exposure control, adjustment of the mA and/or kV according to patient size and/or use of iterative reconstruction technique. COMPARISON:  10/02/2022 FINDINGS: Lower chest: Linear infiltration in the lung bases, greater on the left. Peribronchial thickening may represent acute or chronic bronchitis. Hepatobiliary: Cholelithiasis. No inflammatory changes. No bile duct dilatation. No focal liver lesions identified. Pancreas: Unremarkable. No pancreatic ductal dilatation or surrounding inflammatory changes. Spleen: Normal in size without focal abnormality. Adrenals/Urinary Tract: No adrenal gland nodules. 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 Has there been a history of interval lithotripsy? 4 mm stone in the distal left ureter about 1 cm above the ureterovesical junction. There is proximal hydronephrosis and hydroureter.  Prominent stranding around the left ureter and left kidney. Left kidney is enlarged compared with the right. These are all likely obstructive changes but the stranding could also indicate superimposed infection or may be related to recent procedure if any. There is also gas in the renal collecting system and left ureter. This could result from instrumentation or may indicate infection with gas-forming organism. A Foley catheter decompresses the bladder. The right kidney and ureter are normal. Stomach/Bowel: Stomach, small bowel, and colon are not abnormally distended. Colon is diffusely stool-filled. No wall thickening is identified. Mild stranding in the left pericolic gutter is likely related to the renal process. The appendix is normal. Vascular/Lymphatic: Calcification of the aorta. No aneurysm. Inferior vena caval filter is present. Reproductive: Prostate gland is either atrophic or surgically absent. Other: No free air or free fluid in the abdomen. Abdominal wall musculature appears intact. Fatty atrophy of the hip musculature. Musculoskeletal: Degenerative changes in the spine. Skin defect with gas and debris filled cavity over the lower sacral coccygeal region and extending from bone disc in surface. There is some bone erosion. This suggest decubitus ulceration with osteomyelitis. There is mild progression in the size of the cavity since the previous study. IMPRESSION: 1. Interval loss of prior staghorn calculus with multiple small stones in the left kidney, possibly due to interval lithotripsy. Correlate with clinical history. 2. 4 mm stone in the distal left ureter with moderate proximal obstruction. 3. Stranding around the left kidney and ureter with gas in the left intrarenal collecting system and left ureter, possibly due to instrumentation, obstruction, and/or infection. Correlate with urinalysis. 4. Foley catheter decompresses the bladder. 5. Deep decubitus ulceration with bone loss over the sacral  coccygeal region suggesting underlying osteomyelitis. 6. Cholelithiasis without evidence of acute cholecystitis. Electronically Signed   By: Boyce Byes M.D.   On: 07/11/2023 18:37   DG Chest Portable 1 View Result Date: 07/11/2023 CLINICAL DATA:  Cough. EXAM: PORTABLE CHEST 1 VIEW COMPARISON:  10/02/2022 FINDINGS: The cardiomediastinal contours are normal. The lungs are clear. Pulmonary vasculature is normal. No consolidation, pleural effusion, or pneumothorax. Thoracolumbar fusion hardware. No acute osseous abnormalities are seen. IMPRESSION: No acute chest findings. Electronically  Signed   By: Chadwick Colonel M.D.   On: 07/11/2023 17:55    ------  Assessment:  59 y.o. male with a history of quadriplegia and nephrolithiasis who presented with a 4mm obstructing left distal ureteral stone and associated urosepsis.    Recommendations: - To OR with Dr. Valeta Gaudier for urgent left ureteral stent placement - CTX and vancomycin  administered in ED - Urine and blood cultures pending - Patient to be admitted to medicine service postoperatively - Continue foley  Thank you for this consult. Please contact the urology consult pager with any further questions/concerns.
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
## 2023-07-11 NOTE — ED Provider Notes (Signed)
 4:28 PM Assumed care of patient from off-going team. For more details, please see note from same day.  In brief, this is a 58 y.o. male quadriplegic c/o neck pain, fevers/chills, body aches. 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 Rectal temp 101.9F. Initial lactate 1.1 --> 2.7. Has a catheter w/ +urine now with fever/leukocytosis will treat. Cr 2.40 with +AKI. Sacral wound doesn't appear grossly infected. Has packing in it. Getting ceftriaxone  IV, 1L IVF. No meningismus.  Plan/Dispo at time of sign-out & ED Course since sign-out: [ ]  CT abd pelvis, CXR, admit  BP 125/89   Pulse (!) 113   Temp (!) 101.8 F (38.8 C) (Rectal)   Resp 18   SpO2 100%    ED Course:   Clinical Course as of 07/11/23 1927  Fri Jul 11, 2023  1716 BP now 62/35, will start fluids on pressure bag. Will be his 3rd liter. If doesn't improve will start norepi. Patient states he hasn't been eating/drinking well over the last couple of days and his mouth is very dry on exam. Starting fluids and will reevaluate.  [HN]  1842 CT ABDOMEN PELVIS WO CONTRAST 1. Interval loss of prior staghorn calculus with multiple small stones in the left kidney, possibly due to interval lithotripsy. Correlate with clinical history. 2. 4 mm stone in the distal left ureter with moderate proximal obstruction. 3. Stranding around the left kidney and ureter with gas in the left intrarenal collecting system and left ureter, possibly due to instrumentation, obstruction, and/or infection. Correlate with urinalysis. 4. Foley catheter decompresses the bladder. 5. Deep decubitus ulceration with bone loss over the sacral coccygeal region suggesting underlying osteomyelitis. 6. Cholelithiasis without evidence of acute cholecystitis.   [HN]  1842 Imaging demonstrates 4mm left moderately obstructing urolithiasis and possible L pyelonephritis. He has already been given ceftriaxone . With septic stone will emergently consult urology. He  also has underlying osteomyelitis of sacral decubitus ulcer as well. Will broaden antibiotic coverage to include vancomycin   [HN]  1844 BP: 97/69 Improved BP after 3rd L IVF [HN]  1858 D/w Alisa App with urology who will make moves on getting patient to the OR. Paging gen surg about sacral wound as well as hosptialist to make them aware for after the OR. Dr. Aden Agreste is listed on call for urology today. [HN]  1901 D/w Dr. Hildy Lowers w/ surgery who will come to look at sacral wound and follow along. [HN]  1926 D/w Dr. Brock Canner who will admit patient w/ hospitalist. [HN]    Clinical Course User Index [HN] Merdis Stalling, MD    Dispo: to OR w/ urology ------------------------------- Annita Kindle, MD Emergency Medicine  This note was created using dictation software, which may contain spelling or grammatical errors.  CRITICAL CARE Performed by: Merdis Stalling Total critical care time: 40 minutes Critical care time was exclusive of separately billable procedures and treating other patients. Critical care was necessary to treat or prevent imminent or life-threatening deterioration. Critical care was time spent personally by me on the following activities: development of treatment plan with patient and/or surrogate as well as nursing, discussions with consultants, evaluation of patient's response to treatment, examination of patient, obtaining history from patient or surrogate, ordering and performing treatments and interventions, ordering and review of laboratory studies, ordering and review of radiographic studies, pulse oximetry and re-evaluation of patient's condition.    Merdis Stalling, MD 07/11/23 [id number]

## 2023-07-11 NOTE — Transfer of Care (Signed)
 Immediate Anesthesia Transfer of Care Note  Patient: [name]  Procedure(s) Performed: CYSTOSCOPY, WITH RETROGRADE PYELOGRAM AND URETERAL STENT INSERTION (Left: Ureter)  Patient Location: PACU  Anesthesia Type:General  Level of Consciousness: awake, alert , and oriented  Airway & Oxygen Therapy: Patient Spontanous Breathing and Patient connected to face mask oxygen  Post-op Assessment: Report given to RN and Post -op Vital signs reviewed and stable  Post vital signs: Reviewed and stable  Last Vitals:  Vitals Value Taken Time  BP 86/49 07/11/23 2118  Temp    Pulse 92 07/11/23 2130  Resp 21 07/11/23 2130  SpO2 95 % 07/11/23 2130  Vitals shown include unfiled device data.  Last Pain:  Vitals:   07/11/23 1914  TempSrc: Oral  PainSc: 3          Complications: No notable events documented.

## 2023-07-11 NOTE — Progress Notes (Signed)
 ED Pharmacy Antibiotic Sign Off An antibiotic consult was received from an ED provider for vancomycin  per pharmacy dosing for sepsis. A chart review was completed to assess appropriateness.  A single dose of ceftriaxone  placed by the ED provider.   The following one time order(s) were placed per pharmacy consult:  vancomycin  2000 mg x 1 dose  Further antibiotic and/or antibiotic pharmacy consults should be ordered by the admitting provider if indicated.   Thank you for allowing pharmacy to be a part of this patient's care.   Dionicio Fray, PharmD, BCPS 07/11/2023 6:40 PM ED Clinical Pharmacist -  [phone]

## 2023-07-11 NOTE — ED Provider Triage Note (Signed)
 Emergency Medicine Provider Triage Evaluation Note  [name] , a 58 y.o. male  was evaluated in triage.  Pt complains of neck pain, back pain and headache x 3 days.  Has a sacral wound that is worsening.  Endorses subjective fevers and chills.  Review of Systems  Positive:  Negative:   Physical Exam  BP 98/64   Pulse 98   Resp 18   SpO2 95%  Gen:   Awake, no distress   Resp:  Normal effort  MSK:   Is quadriplegic Other:  Unable to assess wound.  Cervical range of motion is limited due to prior surgery  Medical Decision Making  Medically screening exam initiated at 1:39 PM.  Appropriate orders placed.  [name] was informed that the remainder of the evaluation will be completed by another provider, this initial triage assessment does not replace that evaluation, and the importance of remaining in the ED until their evaluation is complete.     Felicie Horning, PA-C 07/11/23 1339

## 2023-07-11 NOTE — ED Notes (Signed)
 Obtained vital signs, BP dropped to 62/35. I changed blood pressure cuff twice and location for recycle and still remained low. Notifed primary RN and Dr. Drury Geralds.

## 2023-07-11 NOTE — Progress Notes (Signed)
 Pharmacy Antibiotic Note  [name] is a 58 y.o. male for which pharmacy has been consulted for cefepime  and vancomycin  dosing for osteomyelitis of sacral decubitus wound. Patient with a history of quadriplegia, chronic wounds, HTN, T2DM, and nephrolithiasis. Patient presenting with fevers/chills, body aches.  Concern for osteomyelitis of his sacral region on CT. CT also w/ 4mm distal left ureteral stone w/ hydronephrosis   SCr 2.4 - AKI WBC 19; LA 1.1>2.7; T 100.1>98.7; HR 88; RR 18  Rocephin  given in the ED x 1   Plan: Cefepime  2g q12hr  Vancomycin  2g x 1 in the ED  -- repeat dose per level unless renal function stable Monitor WBC, fever, renal function, cultures De-escalate when able     Temp (24hrs), Avg:99.9 F (37.7 C), Min:98.7 F (37.1 C), Max:101.8 F (38.8 C)  Recent Labs  Lab 07/11/23 1313 07/11/23 1343 07/11/23 1604  WBC 19.0*  --   --   CREATININE 2.40*  --   --   LATICACIDVEN  --  1.1 2.7*    CrCl cannot be calculated (Unknown ideal weight.).    No Known Allergies  Microbiology results: Pending  Thank you for allowing pharmacy to be a part of this patient's care.  Dionicio Fray, PharmD, BCPS 07/11/2023 7:46 PM ED Clinical Pharmacist -  [phone]

## 2023-07-11 NOTE — Anesthesia Preprocedure Evaluation (Addendum)
 Anesthesia Evaluation  Patient identified by MRN, date of birth, ID band Patient awake    Reviewed: Allergy & Precautions, NPO status , Patient's Chart, lab work & pertinent test results  Airway Mallampati: IV   Neck ROM: Limited  Mouth opening: Limited Mouth Opening  Dental  (+) Teeth Intact   Pulmonary    Pulmonary exam normal        Cardiovascular hypertension, Pt. on medications  Rhythm:Regular Rate:Normal     Neuro/Psych negative neurological ROS  negative psych ROS   GI/Hepatic negative GI ROS, Neg liver ROS,,,  Endo/Other  diabetes, Type 2, Oral Hypoglycemic Agents, Insulin  Dependent    Renal/GU Renal disease     Musculoskeletal   Abdominal   Peds  Hematology  (+) Blood dyscrasia, anemia   Anesthesia Other Findings   Reproductive/Obstetrics                             Anesthesia Physical Anesthesia Plan  ASA: 3  Anesthesia Plan: General   Post-op Pain Management: Ofirmev  IV (intra-op)*   Induction: Intravenous  PONV Risk Score and Plan: 3 and Ondansetron , Midazolam  and Treatment may vary due to age or medical condition  Airway Management Planned: Oral ETT and Video Laryngoscope Planned  Additional Equipment: None  Intra-op Plan:   Post-operative Plan: Extubation in OR  Informed Consent: I have reviewed the patients History and Physical, chart, labs and discussed the procedure including the risks, benefits and alternatives for the proposed anesthesia with the patient or authorized representative who has indicated his/her understanding and acceptance.     Dental advisory given and Interpreter used for interview  Plan Discussed with: CRNA  Anesthesia Plan Comments:        Anesthesia Quick Evaluation

## 2023-07-11 NOTE — Anesthesia Postprocedure Evaluation (Signed)
 Anesthesia Post Note  Patient: [name]  Procedure(s) Performed: CYSTOSCOPY, WITH RETROGRADE PYELOGRAM AND URETERAL STENT INSERTION (Left: Ureter)     Patient location during evaluation: PACU Anesthesia Type: General Level of consciousness: awake and alert Pain management: pain level controlled Vital Signs Assessment: post-procedure vital signs reviewed and stable Respiratory status: spontaneous breathing, nonlabored ventilation, respiratory function stable and patient connected to nasal cannula oxygen Cardiovascular status: blood pressure returned to baseline and stable Postop Assessment: no apparent nausea or vomiting Anesthetic complications: no  No notable events documented.  Last Vitals:  Vitals:   07/11/23 2200 07/11/23 2215  BP: 94/61 (!) 91/58  Pulse: 85 90  Resp: 14 12  Temp:    SpO2: 93% 92%    Last Pain:  Vitals:   07/11/23 2215  TempSrc:   PainSc: Asleep                 Willian Harrow

## 2023-07-11 NOTE — H&P (Signed)
 History and Physical      [name] ZOX:[id number] DOB: Sep 10, 1965 DOA: 07/11/2023; DOS: 07/11/2023  PCP: Jonell Neptune, FNP  Patient coming from: home   I have personally briefly reviewed patient's old medical records in [facility]  Chief Complaint: Subjective fever, chills  HPI: [name] is a 58 y.o. male with medical history significant for C8 paraplegia, type 2 diabetes mellitus, essential hypertension, history of DVT on Eliquis , who is admitted to [facility] on 07/11/2023 with severe sepsis due to infected obstructing left ureterolithiasis after presenting from home to [facility] ED complaining of subjective fever, chills.   Patient reports 2 days of subjective fever, chills, in the absence of rigors or generalized myalgias.  He has a history of C8 paraplegia complicated by neurogenic bladder with chronic indwelling Foley catheter.  No recent shortness of breath nor any recent chest pain.  He also has a history of recurrent sacral decubitus wounds, and underwent surgical I&D in [facility] OR for such in May 2024.  He has a history of DVT for which she is chronically anticoagulated on Eliquis .  Otherwise, not on a blood thinners as an outpatient.   ED Course:  Vital signs in the ED were notable for the following: Temperature max 101.8; initial heart rate in the 1 teens consistently decreasing into the range of 80s to 90s following IV fluids; initial blood pressure 62/35, which is increased to 98/74 following receipt of 3 L of IV fluid; respiratory rate 17-18, oxygen saturation 95 to 100% on room air.  Labs were notable for the following: CMP was notable for the following: Sodium 133 for which adjusted approximately 135 when taking into account concomitant hyperglycemia, potassium 4.1, bicarbonate 20, anion gap 11, creatinine 2.40 relative to most recent prior creatinine data point of 0.62 on 10/07/2022, glucose 239, calcium adjusted for mild hypoalbuminemia  noted to be 8.9, avidin 2.8.  Otherwise, liver enzymes were within normal limits.  Initial lactic acid 1.1, with repeat trending up to 2.7.  CBC notable for evidence of count 19,000 with 87% neutrophils, hemoglobin 10.2 associated Neuraceq/Norocarp properties and relative to most recent prior hemoglobin data point of 10.3 on 10/07/2022.  Blood cultures x 2 were collected prior to initiation of IV antibiotics.  Urine culture was also collected prior to initiation of IV antibiotics.  Urinalysis was notable for cloudy appearing specimen with greater than 50 white blood cells, moderate leukocyte esterase, no evidence discos epithelial cells, moderate hemoglobin of 21-50 red blood cells, 100 protein, positive for hyaline casts as well as positive for calcium oxalate crystals and was associate with specific gravity of 1.027.  Per my interpretation, EKG in ED demonstrated the following: No EKG performed in the ED today.  Imaging in the ED, per corresponding formal radiology read, was notable for the following: 1 view chest x-ray showed no evidence of acute cardiopulmonary process, including no evidence of infiltrate or any evidence of pulmonary edema, effusion, or pneumothorax.  CT abdomen/pelvis without contrast showed a 4 mm stone in the distal left ureter with moderate proximal obstruction, as well as evidence of stranding around the left kidney and left ureter, concerning for infection.  CT abdomen/pelvis also showed deep sacral decubitus ulceration associated with bone loss over the sacral coccygeal region suggestive of osteomyelitis.   EDP d/w on-call urology resident, Dr. Alphonza Ashing, who is working with urology attending, Dr. Valeta Gaudier. Per these discussions, urology will formally consult and are planning to take the patient to the OR  this evening for left ureteral stent placement. Additionally, EDP has discussed the potential coccygeal osteomyelitis with on-call general surgery, Dr. Dorena Gander, who will  formally consult, with additional recommendations pending at this time.  While in the ED, the following were administered: Rocephin , IV vancomycin , lactated Ringer 's x 2 L bolus, normal saline x 1 L bolus.  Subsequently, the patient was admitted for further evaluation management of severe sepsis due to urinary tract infection in the setting of infected, obstructing left ureterolithiasis, as well as concern for coccygeal osteomyelitis associated with sacral decubitus wound, with presenting labs also notable for acute kidney injury.    Review of Systems: As per HPI otherwise 10 point review of systems negative.   Past Medical History:  Diagnosis Date   Diabetes mellitus without complication (HCC)    Dyspnea    History of cervical fracture    Hypertension    Paralysis (HCC)    BLE    Past Surgical History:  Procedure Laterality Date   AMPUTATION Left 08/10/2021   Procedure: LEFT 5TH RAY AMPUTATION;  Surgeon: Timothy Ford, MD;  Location: [facility] OR;  Service: Orthopedics;  Laterality: Left;   CERVICAL SPINE SURGERY     INCISION AND DRAINAGE OF WOUND N/A 07/06/2022   Procedure: IRRIGATION AND DEBRIDEMENT SACRAL WOUND;  Surgeon: Dorena Gander, MD;  Location: [facility] OR;  Service: General;  Laterality: N/A;    Social History:  reports that he has never smoked. He has never used smokeless tobacco. He reports current alcohol  use. He reports that he does not use drugs.   No Known Allergies  Family History  Problem Relation Age of Onset   Diabetes Mother    Diabetes Father     Family history reviewed and not pertinent    Prior to Admission medications   Medication Sig Start Date End Date Taking? Authorizing Provider  acetaminophen  (TYLENOL ) 500 MG tablet Take 500 mg by mouth every 6 (six) hours as needed for mild pain.    [provider]  apixaban  (ELIQUIS ) 5 MG TABS tablet Take 1 tablet by mouth 2 (two) times daily. 01/22/22   [provider]  baclofen  (LIORESAL ) 10 MG  tablet TAKE 1 TABLET BY MOUTH 3 TIMES DAILY AS NEEDED FOR MUSCLE SPASMS OR tightness 04/25/23   Lovorn, Megan, MD  baclofen  (LIORESAL ) 20 MG tablet Take 1 tablet (20 mg total) by mouth 3 (three) times daily. 06/27/23   Lovorn, Megan, MD  BISACODYL  LAXATIVE RE Place 1 suppository rectally at bedtime.    [provider]  Carboxymethylcellulose Sodium (ARTIFICIAL TEARS OP) Apply 1-2 drops to eye daily as needed (dry eyes).     [provider]  docusate sodium  (COLACE) 100 MG capsule Take 1 capsule (100 mg total) by mouth 2 (two) times daily for 5 days, then as directed by physician 07/12/22   Aura Leeds Latif, DO  FLUoxetine  (PROZAC ) 20 MG capsule Take 1 capsule (20 mg total) by mouth daily. 03/26/23   Lovorn, Megan, MD  furosemide  (LASIX ) 40 MG tablet Take 1 tablet daily for 6 days, take as needed after that for swelling or weight gain 09/25/19   Kraig Peru, MD  gabapentin  (NEURONTIN ) 600 MG tablet Take 1 tablet (600 mg total) by mouth 3 (three) times daily. 03/26/23   Lovorn, Megan, MD  glipiZIDE  (GLUCOTROL ) 10 MG tablet Take 1 tablet by mouth 2 (two) times daily. 11/22/21   [provider]  HYDROcodone -acetaminophen  (NORCO) 5-325 MG tablet Take 1 tablet by mouth every  6 (six) hours as needed for moderate pain (pain score 4-6). 12/25/22   Lovorn, Megan, MD  insulin  glargine (LANTUS ) 100 UNIT/ML injection Inject 30 Units into the skin at bedtime. 02/28/22   [provider]  lisinopril  (ZESTRIL ) 5 MG tablet Take by mouth. 10/17/22   [provider]  ondansetron  (ZOFRAN ) 4 MG tablet Take 1 tablet (4 mg total) by mouth every 6 (six) hours as needed for nausea. 07/12/22   Aura Leeds Latif, DO  sildenafil  (VIAGRA ) 100 MG tablet Take by mouth. 10/17/22   [provider]     Objective    Physical Exam: Vitals:   07/11/23 1815 07/11/23 1830 07/11/23 1845 07/11/23 1914  BP: (!) 89/58 97/69 98/61  98/74  Pulse: 88 90 95 88  Resp:    18  Temp:    98.7  F (37.1 C)  TempSrc:    Oral  SpO2: 99% 99% (!) 84% 98%    General: appears to be stated age; alert, oriented Skin: warm, dry, no rash Head:  AT/[place] Mouth:  Oral mucosa membranes appear dry, normal dentition Neck: supple; trachea midline Heart:  RRR; did not appreciate any M/R/G Lungs: CTAB, did not appreciate any wheezes, rales, or rhonchi Abdomen: + BS; soft, ND, NT Vascular: 2+ pedal pulses b/l; 2+ radial pulses b/l Extremities: no peripheral edema, no muscle wasting    Labs on Admission: I have personally reviewed following labs and imaging studies  CBC: Recent Labs  Lab 07/11/23 1313  WBC 19.0*  NEUTROABS 16.5*  HGB 10.2*  HCT 32.2*  MCV 83.0  PLT 239   Basic Metabolic Panel: Recent Labs  Lab 07/11/23 1313  NA 133*  K 4.1  CL 102  CO2 20*  GLUCOSE 239*  BUN 32*  CREATININE 2.40*  CALCIUM 7.9*   GFR: CrCl cannot be calculated (Unknown ideal weight.). Liver Function Tests: Recent Labs  Lab 07/11/23 1313  AST 14*  ALT 11  ALKPHOS 71  BILITOT 0.9  PROT 6.8  ALBUMIN  2.8*   No results for input(s): "LIPASE", "AMYLASE" in the last 168 hours. No results for input(s): "AMMONIA" in the last 168 hours. Coagulation Profile: No results for input(s): "INR", "PROTIME" in the last 168 hours. Cardiac Enzymes: No results for input(s): "CKTOTAL", "CKMB", "CKMBINDEX", "TROPONINI" in the last 168 hours. BNP (last 3 results) No results for input(s): "PROBNP" in the last 8760 hours. HbA1C: No results for input(s): "HGBA1C" in the last 72 hours. CBG: No results for input(s): "GLUCAP" in the last 168 hours. Lipid Profile: No results for input(s): "CHOL", "HDL", "LDLCALC", "TRIG", "CHOLHDL", "LDLDIRECT" in the last 72 hours. Thyroid Function Tests: No results for input(s): "TSH", "T4TOTAL", "FREET4", "T3FREE", "THYROIDAB" in the last 72 hours. Anemia Panel: No results for input(s): "VITAMINB12", "FOLATE", "FERRITIN", "TIBC", "IRON", "RETICCTPCT" in the last 72  hours. Urine analysis:    Component Value Date/Time   COLORURINE AMBER (A) 07/11/2023 1313   APPEARANCEUR CLOUDY (A) 07/11/2023 1313   LABSPEC 1.027 07/11/2023 1313   PHURINE 5.0 07/11/2023 1313   GLUCOSEU NEGATIVE 07/11/2023 1313   HGBUR MODERATE (A) 07/11/2023 1313   BILIRUBINUR NEGATIVE 07/11/2023 1313   KETONESUR NEGATIVE 07/11/2023 1313   PROTEINUR 100 (A) 07/11/2023 1313   NITRITE NEGATIVE 07/11/2023 1313   LEUKOCYTESUR MODERATE (A) 07/11/2023 1313    Radiological Exams on Admission: CT ABDOMEN PELVIS WO CONTRAST Result Date: 07/11/2023 CLINICAL DATA:  Sepsis.  Neck pain. EXAM: CT ABDOMEN AND PELVIS WITHOUT CONTRAST TECHNIQUE: Multidetector CT imaging of the abdomen and  pelvis was performed following the standard protocol without IV contrast. RADIATION DOSE REDUCTION: This exam was performed according to the departmental dose-optimization program which includes automated exposure control, adjustment of the mA and/or kV according to patient size and/or use of iterative reconstruction technique. COMPARISON:  10/02/2022 FINDINGS: Lower chest: Linear infiltration in the lung bases, greater on the left. Peribronchial thickening may represent acute or chronic bronchitis. Hepatobiliary: Cholelithiasis. No inflammatory changes. No bile duct dilatation. No focal liver lesions identified. Pancreas: Unremarkable. No pancreatic ductal dilatation or surrounding inflammatory changes. Spleen: Normal in size without focal abnormality. Adrenals/Urinary Tract: No adrenal gland nodules. 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 Has there been a history of interval lithotripsy? 4 mm stone in the distal left ureter about 1 cm above the ureterovesical junction. There is proximal hydronephrosis and hydroureter. Prominent stranding around the left ureter and left kidney. Left kidney is enlarged compared with the right. These are all likely  obstructive changes but the stranding could also indicate superimposed infection or may be related to recent procedure if any. There is also gas in the renal collecting system and left ureter. This could result from instrumentation or may indicate infection with gas-forming organism. A Foley catheter decompresses the bladder. The right kidney and ureter are normal. Stomach/Bowel: Stomach, small bowel, and colon are not abnormally distended. Colon is diffusely stool-filled. No wall thickening is identified. Mild stranding in the left pericolic gutter is likely related to the renal process. The appendix is normal. Vascular/Lymphatic: Calcification of the aorta. No aneurysm. Inferior vena caval filter is present. Reproductive: Prostate gland is either atrophic or surgically absent. Other: No free air or free fluid in the abdomen. Abdominal wall musculature appears intact. Fatty atrophy of the hip musculature. Musculoskeletal: Degenerative changes in the spine. Skin defect with gas and debris filled cavity over the lower sacral coccygeal region and extending from bone disc in surface. There is some bone erosion. This suggest decubitus ulceration with osteomyelitis. There is mild progression in the size of the cavity since the previous study. IMPRESSION: 1. Interval loss of prior staghorn calculus with multiple small stones in the left kidney, possibly due to interval lithotripsy. Correlate with clinical history. 2. 4 mm stone in the distal left ureter with moderate proximal obstruction. 3. Stranding around the left kidney and ureter with gas in the left intrarenal collecting system and left ureter, possibly due to instrumentation, obstruction, and/or infection. Correlate with urinalysis. 4. Foley catheter decompresses the bladder. 5. Deep decubitus ulceration with bone loss over the sacral coccygeal region suggesting underlying osteomyelitis. 6. Cholelithiasis without evidence of acute cholecystitis. Electronically  Signed   By: Boyce Byes M.D.   On: 07/11/2023 18:37   DG Chest Portable 1 View Result Date: 07/11/2023 CLINICAL DATA:  Cough. EXAM: PORTABLE CHEST 1 VIEW COMPARISON:  10/02/2022 FINDINGS: The cardiomediastinal contours are normal. The lungs are clear. Pulmonary vasculature is normal. No consolidation, pleural effusion, or pneumothorax. Thoracolumbar fusion hardware. No acute osseous abnormalities are seen. IMPRESSION: No acute chest findings. Electronically Signed   By: Chadwick Colonel M.D.   On: 07/11/2023 17:55      Assessment/Plan   Principal Problem:   Kidney stone on left side Active Problems:   Pyelonephritis of left kidney   DM2 (diabetes mellitus, type 2) (HCC)   History of DVT (deep vein thrombosis)   Anemia of chronic disease   Severe sepsis (HCC)   Osteomyelitis (HCC)   AKI (  acute kidney injury) (HCC)   History of essential hypertension     #) Severe sepsis due to urinary tract infection due to infected, obstructing left ureterolithiasis: In the setting of 2 days of subjective fever, chills, is noted to be objectively febrile in the emergency department this evening, with urinalysis suggestive of UTI in the context of cloudy appearing specimen with significant pyuria, moderate leukocyte esterase, and no evidence of squamous epithelial cells to suggest a contaminated specimen.  His urinary tract infection appears to be on the basis of an infected obstructing left ureterolithiasis given CT abdomen/pelvis showing evidence of a 4 mm stone in the distal left ureter with moderate proximal obstruction, as well as stranding around the left kidney/ureter concerning for an element of left-sided pyelonephritis.  Unable to evaluate for acute left flank discomfort in the context of his C8 paraplegia.  SIRS criteria met via objective fever, leukocytosis with neutrophilic predominance, tachycardia. Lactic acid level: Is elevated at 2.7, with repeat lactic acid level currently pending.  Of note, given the associated presence of suspected end organ damage in the form of concominant presenting lactic acidosis, criteria are met for pt's sepsis to be considered severe in nature.  It is noted that the patient's ideal body weight based upon a documented height is 67 inches would be 62 kg.  In the setting, he has received greater than a 30 mL/kg IVF bolus in the ED a receipt offter 3 L of IV fluid.  Initial hypotension has improved with these IV fluids, as further quantified above  EDP d/w on-call urology resident, Dr. Alphonza Ashing, who is working with urology attending, Dr. Valeta Gaudier. Per these discussions, urology will formally consult and are planning to take the patient to the OR this evening for left ureteral stent placement.   Venice Gillis Score for this patient in the context of anticipated aforementioned surgery conveys a  0.9% perioperative risk for significant cardiac event. No evidence to suggest acutely decompensated heart failure or acute MI. Will check pre-op EKG at this time. Consequently, no absolute contraindications to proceeding with proposed surgery at this time.  It is noted that he is chronically anticoagulated on Eliquis  in the setting of history of unprovoked DVT.  In addition to his urinary tract infection from infected obstructing left ureterolithiasis, there is also concern for potential coccygeal osteomyelitis associated with sacral decubitus wound.   In the ED this evening, blood cultures x 2 as well as urine culture were collected prior to initiation of IV vancomycin  and Rocephin .  Given concomitant concern for potential osteomyelitis, will continue IV vancomycin , but escalate Rocephin  to cefepime , and also add IV Flagyl  given the location of suspected osteomyelitis.  Of note, chest x-ray showed no evidence of acute process, including no evidence of infiltrate to suggest pneumonia.  Of note, the patient has a chronic indwelling Foley catheter, which is present prior to arrival.   Neurogenic bladder stemming from his C8 paraplegia.  Plan: CBC w/ diff and CMP in AM.  Follow for results of blood cx's x 2 as well as urine. Abx: Continue IV vancomycin .  Start cefepime  and IV Flagyl , as further detailed above.  Urology to formally consult.  N.p.o. leading up to plan for left ureteral stent placement this evening.  Lactated Ringer 's at 100 cc/h x 12 hours.  Stat repeat lactic acid level.  Prn acetaminophen  for fever.  Monitor on telemetry.  Check preoperative EKG, INR.  Check type and screen.  Further evaluation management of concern regarding coccygeal osteomyelitis,  as further detailed below.  Holding home Eliquis  for now.                    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  It is noted that in May 2024, that he underwent surgical I&D in [facility] OR relating to the sacral decubitus wound.  Additionally, EDP has discussed the potential coccygeal osteomyelitis with on-call general surgery, Dr. Dorena Gander, who will formally consult, with additional recommendations pending at this time.  Blood cultures x 2 were collected in the ED this evening followed by initiation of Rocephin  and IV vancomycin .  Given concern for osteomyelitis, will continue the IV vancomycin  and escalate Rocephin  to cefepime .  Additionally, given the location of concern for osteomyelitis, will also add anaerobic coverage in the form of IV Flagyl .  Plan: Continuous IV "Ringer's" , as above.  Stat repeat lactic acid, as above.  Follow for results of blood cultures x 2.  Continue IV vancomycin .  Start cefepime  and IV Flagyl , as above.  Check ESR, CRP.  General surgery to formally consult, as above.   As needed acetaminophen  for fever.  Repeat CBC in the morning.  Holding home Eliquis  for now.  Will care consult placed.                   #) Acute Kidney Injury:   Presenting creatinine 2.40 compared to most recent prior value of 0.62 in August 2024.  Appears to be a postrenal obstructive contribution in the setting of his presenting obstructing left ureterolithiasis.  Urinalysis with microscopy notable for significant pyuria, concerning for infected left obstructing pyelonephritis, as well as demonstrating evidence of moderate hemoglobin 21-50 RBCs, which appears consistent with this finding of left ureteral lithiasis, also demonstrating 100 protein.  There is also the potential for pharmacologic exacerbation in the setting of outpatient use of lisinopril  as well as gabapentin .  Plan: monitor strict I's & O's and daily weights. Attempt to avoid nephrotoxic agents.  Hold home gabapentin  and lisinopril  for now.  Refrain from NSAIDs. Repeat CMP in the morning. Check serum magnesium  level. Add-on random urine sodium and random urine creatinine.  Continuous lactated Ringer 's, as above.  Further evaluation and management of obstructing left ureterolithiasis, including urology consultation with associated plan for left ureteral stent placement tonight.                    #) Type 2 Diabetes Mellitus: documented history of such. Home insulin  regimen: Lantus  30 units subcu nightly. Home oral hypoglycemic agents: Glipizide . presenting blood sugar: 239, without corresponding evidence of anion gap metabolic acidosis.. Most recent A1c noted to be 8.1% when checked in May 2024. in terms of initial dose of basal insulin  to be started during this hospitalization, will resume slightly less than half of outpatient dose in order to reduce risk for ensuing hypoglycemia  Plan: In the setting of current n.p.o. status, will pursue Accu-Cheks every 6 hours with associated low-dose sliding scale  insulin .  Lantus  10 units subcu nightly, as above.  Hold home glipizide  for now.                       #) Essential Hypertension: documented h/o such, with outpatient  antihypertensive regimen including lisinopril .  In the setting of presenting severe sepsis and initial soft blood pressures, as well as acute kidney injury, will hold her lisinopril  for now.  Plan: Close monitoring of subsequent BP via routine VS. hold home lisinopril  for now, as above.                    #) Anemia of chronic disease: Documented history of such, a/w with baseline hgb range 10-12, with presenting hgb consistent with this range, in the absence of any overt evidence of active bleed.     Plan: Repeat CBC in the morning.  Check INR.                      #) history of DVT: Documented history of such, for which she is chronically anticoagulated on Eliquis  due to concern for provide nature of this DVT.  In the context of plan for ureterolithiasis with left ureteral stent placement this evening as well as evaluation by general surgery regarding concern for coccygeal osteomyelitis, will hold next dose of Eliquis  for now.  Plan: Hold next dose of Eliquis  for now, as above.  Repeat CBC in the morning.    DVT prophylaxis: SCD's ; holding home eliquis  for now in setting of plan for surgical procedure, as above  Code Status: Full code Family Communication: none Disposition Plan: Per Rounding Team Consults called: EDP d/w on-call urology resident, Dr. Alphonza Ashing, who is working with urology attending, Dr. Valeta Gaudier. Per these discussions, urology will formally consult and are planning to take the patient to the OR this evening for left ureteral stent placement. Additionally, EDP has discussed the potential coccygeal osteomyelitis with on-call general surgery, Dr. Dorena Gander, who will formally consult, with additional recommendations pending at this time.;  Admission status:  Inpatient     I SPENT GREATER THAN 75  MINUTES IN CLINICAL CARE TIME/MEDICAL DECISION-MAKING IN COMPLETING THIS ADMISSION.      Gattis Kass Edenilson Austad DO [facility]  From 7PM - 7AM   07/11/2023, 7:43 PM

## 2023-07-11 NOTE — Consult Note (Signed)
 Urology Consult Note   Requesting Attending Physician:  Roxana Copier, DO Service Providing Consult: Urology  Consulting Attending: Perley Bradley, MD   Reason for Consult:  septic stone  HPI: [name] is seen in consultation for reasons noted above at the request of Howerter, Justin B, DO  for evaluation of septic stone.  This is a 58 y.o. male with a history of quadriplegia, HTN, T2DM, and nephrolithiasis who presented to the ED with fevers and chills as well as neck and back pain. ED workup notable for Tmax 101.8, BP 98/64, HR 98. Labs show cr 2.4 (baseline 0.62), leukocytosis to 19, LA 2.7, UA with negative nitrite, >50 WBC, 21-50 RBCs.   CT obtained showing a 4mm distal left ureteral stone with associated left hydronephrosis and stranding. 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 Secundino Dach in clinic in November 2024 and planned for observation. There is no evidence of staghorn on today's CT and the patient denies ever having any kidney stone procedures. He has a neurogenic bladder managed with indwelling urethral catheter changed monthly by his wife. Takes eliquis  for history of DVT.    On my exam he is rigoring and appears unwell. We discussed plans for urgent left ureteral stent placement, followed by admission to hospitalist service for abx and AKI management. He also has concern for osteomyelitis of his sacral region on CT, so the ED has consulted general surgery.    Past Medical History: Past Medical History:  Diagnosis Date   Diabetes mellitus without complication (HCC)    Dyspnea    History of cervical fracture    Hypertension    Paralysis (HCC)    BLE    Past Surgical History:  Past Surgical History:  Procedure Laterality Date   AMPUTATION Left 08/10/2021   Procedure: LEFT 5TH RAY AMPUTATION;  Surgeon: Timothy Ford, MD;  Location: [facility] OR;  Service: Orthopedics;  Laterality: Left;   CERVICAL SPINE SURGERY     INCISION  AND DRAINAGE OF WOUND N/A 07/06/2022   Procedure: IRRIGATION AND DEBRIDEMENT SACRAL WOUND;  Surgeon: Dorena Gander, MD;  Location: [facility] OR;  Service: General;  Laterality: N/A;    Medication: Current Facility-Administered Medications  Medication Dose Route Frequency Provider Last Rate Last Admin   acetaminophen  (TYLENOL ) tablet 650 mg  650 mg Oral Q6H PRN Howerter, Justin B, DO       Or   acetaminophen  (TYLENOL ) suppository 650 mg  650 mg Rectal Q6H PRN Howerter, Justin B, DO       fentaNYL  (SUBLIMAZE ) injection 25 mcg  25 mcg Intravenous Q2H PRN Howerter, Justin B, DO       HYDROmorphone (DILAUDID) injection 0.5 mg  0.5 mg Intravenous Once Naasz, Hayley N, MD       [START ON 07/12/2023] insulin  aspart (novoLOG ) injection 0-6 Units  0-6 Units Subcutaneous Q6H Howerter, Justin B, DO       lactated ringers  infusion   Intravenous Continuous Howerter, Justin B, DO       metroNIDAZOLE  (FLAGYL ) IVPB 500 mg  500 mg Intravenous Q12H Howerter, Justin B, DO       naloxone (NARCAN) injection 0.4 mg  0.4 mg Intravenous PRN Howerter, Justin B, DO       ondansetron  (ZOFRAN ) injection 4 mg  4 mg Intravenous Q6H PRN Howerter, Justin B, DO       vancomycin  (VANCOREADY) IVPB 2000 mg/400 mL  2,000 mg Intravenous Once Merdis Stalling, MD  Current Outpatient Medications  Medication Sig Dispense Refill   acetaminophen  (TYLENOL ) 500 MG tablet Take 500 mg by mouth every 6 (six) hours as needed for mild pain.     apixaban  (ELIQUIS ) 5 MG TABS tablet Take 1 tablet by mouth 2 (two) times daily.     baclofen  (LIORESAL ) 10 MG tablet TAKE 1 TABLET BY MOUTH 3 TIMES DAILY AS NEEDED FOR MUSCLE SPASMS OR tightness 270 tablet 1   baclofen  (LIORESAL ) 20 MG tablet Take 1 tablet (20 mg total) by mouth 3 (three) times daily. 270 each 1   BISACODYL  LAXATIVE RE Place 1 suppository rectally at bedtime.     Carboxymethylcellulose Sodium (ARTIFICIAL TEARS OP) Apply 1-2 drops to eye daily as needed (dry eyes).      docusate sodium   (COLACE) 100 MG capsule Take 1 capsule (100 mg total) by mouth 2 (two) times daily for 5 days, then as directed by physician 100 capsule 0   FLUoxetine  (PROZAC ) 20 MG capsule Take 1 capsule (20 mg total) by mouth daily. 90 capsule 3   furosemide  (LASIX ) 40 MG tablet Take 1 tablet daily for 6 days, take as needed after that for swelling or weight gain 30 tablet 0   gabapentin  (NEURONTIN ) 600 MG tablet Take 1 tablet (600 mg total) by mouth 3 (three) times daily. 270 tablet 3   glipiZIDE  (GLUCOTROL ) 10 MG tablet Take 1 tablet by mouth 2 (two) times daily.     HYDROcodone -acetaminophen  (NORCO) 5-325 MG tablet Take 1 tablet by mouth every 6 (six) hours as needed for moderate pain (pain score 4-6). 30 tablet 0   insulin  glargine (LANTUS ) 100 UNIT/ML injection Inject 30 Units into the skin at bedtime.     lisinopril  (ZESTRIL ) 5 MG tablet Take by mouth.     ondansetron  (ZOFRAN ) 4 MG tablet Take 1 tablet (4 mg total) by mouth every 6 (six) hours as needed for nausea. 20 tablet 0   sildenafil  (VIAGRA ) 100 MG tablet Take by mouth.      Allergies: No Known Allergies  Social History: Social History   Tobacco Use   Smoking status: Never   Smokeless tobacco: Never  Vaping Use   Vaping status: Former  Substance Use Topics   Alcohol  use: Yes    Comment: social   Drug use: No    Family History Family History  Problem Relation Age of Onset   Diabetes Mother    Diabetes Father     Review of Systems 10 systems were reviewed and are negative except as noted specifically in the HPI.  Objective   Vital signs in last 24 hours: BP 98/74 (BP Location: Left Arm)   Pulse 88   Temp 98.7 F (37.1 C) (Oral)   Resp 18   SpO2 98%   Physical Exam General: NAD, A&O, resting, appropriate HEENT: /AT, EOMI, MMM Pulmonary: Normal work of breathing Cardiovascular: HDS, adequate peripheral perfusion Abdomen: Soft, NTTP, nondistended GU: Foley in place draining cloudy urine.  Extremities: warm and  well perfused Neuro: Appropriate, quadriplegic  Most Recent Labs: Lab Results  Component Value Date   WBC 19.0 (H) 07/11/2023   HGB 10.2 (L) 07/11/2023   HCT 32.2 (L) 07/11/2023   PLT 239 07/11/2023    Lab Results  Component Value Date   NA 133 (L) 07/11/2023   K 4.1 07/11/2023   CL 102 07/11/2023   CO2 20 (L) 07/11/2023   BUN 32 (H) 07/11/2023   CREATININE 2.40 (H) 07/11/2023   CALCIUM 7.9 (L)  07/11/2023   MG 1.9 10/03/2022   PHOS 3.2 07/12/2022    Lab Results  Component Value Date   INR 1.3 (H) 10/02/2022   APTT 46 (H) 07/06/2022     Urine Culture: @[id number] (laburin,org,[id number])@   IMAGING: CT ABDOMEN PELVIS WO CONTRAST Result Date: 07/11/2023 CLINICAL DATA:  Sepsis.  Neck pain. EXAM: CT ABDOMEN AND PELVIS WITHOUT CONTRAST TECHNIQUE: Multidetector CT imaging of the abdomen and pelvis was performed following the standard protocol without IV contrast. RADIATION DOSE REDUCTION: This exam was performed according to the departmental dose-optimization program which includes automated exposure control, adjustment of the mA and/or kV according to patient size and/or use of iterative reconstruction technique. COMPARISON:  10/02/2022 FINDINGS: Lower chest: Linear infiltration in the lung bases, greater on the left. Peribronchial thickening may represent acute or chronic bronchitis. Hepatobiliary: Cholelithiasis. No inflammatory changes. No bile duct dilatation. No focal liver lesions identified. Pancreas: Unremarkable. No pancreatic ductal dilatation or surrounding inflammatory changes. Spleen: Normal in size without focal abnormality. Adrenals/Urinary Tract: No adrenal gland nodules. 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 Has there been a history of interval lithotripsy? 4 mm stone in the distal left ureter about 1 cm above the ureterovesical junction. There is proximal hydronephrosis and hydroureter.  Prominent stranding around the left ureter and left kidney. Left kidney is enlarged compared with the right. These are all likely obstructive changes but the stranding could also indicate superimposed infection or may be related to recent procedure if any. There is also gas in the renal collecting system and left ureter. This could result from instrumentation or may indicate infection with gas-forming organism. A Foley catheter decompresses the bladder. The right kidney and ureter are normal. Stomach/Bowel: Stomach, small bowel, and colon are not abnormally distended. Colon is diffusely stool-filled. No wall thickening is identified. Mild stranding in the left pericolic gutter is likely related to the renal process. The appendix is normal. Vascular/Lymphatic: Calcification of the aorta. No aneurysm. Inferior vena caval filter is present. Reproductive: Prostate gland is either atrophic or surgically absent. Other: No free air or free fluid in the abdomen. Abdominal wall musculature appears intact. Fatty atrophy of the hip musculature. Musculoskeletal: Degenerative changes in the spine. Skin defect with gas and debris filled cavity over the lower sacral coccygeal region and extending from bone disc in surface. There is some bone erosion. This suggest decubitus ulceration with osteomyelitis. There is mild progression in the size of the cavity since the previous study. IMPRESSION: 1. Interval loss of prior staghorn calculus with multiple small stones in the left kidney, possibly due to interval lithotripsy. Correlate with clinical history. 2. 4 mm stone in the distal left ureter with moderate proximal obstruction. 3. Stranding around the left kidney and ureter with gas in the left intrarenal collecting system and left ureter, possibly due to instrumentation, obstruction, and/or infection. Correlate with urinalysis. 4. Foley catheter decompresses the bladder. 5. Deep decubitus ulceration with bone loss over the sacral  coccygeal region suggesting underlying osteomyelitis. 6. Cholelithiasis without evidence of acute cholecystitis. Electronically Signed   By: Boyce Byes M.D.   On: 07/11/2023 18:37   DG Chest Portable 1 View Result Date: 07/11/2023 CLINICAL DATA:  Cough. EXAM: PORTABLE CHEST 1 VIEW COMPARISON:  10/02/2022 FINDINGS: The cardiomediastinal contours are normal. The lungs are clear. Pulmonary vasculature is normal. No consolidation, pleural effusion, or pneumothorax. Thoracolumbar fusion hardware. No acute osseous abnormalities are seen. IMPRESSION: No acute chest findings. Electronically  Signed   By: Chadwick Colonel M.D.   On: 07/11/2023 17:55    ------  Assessment:  59 y.o. male with a history of quadriplegia and nephrolithiasis who presented with a 4mm obstructing left distal ureteral stone and associated urosepsis.    Recommendations: - To OR with Dr. Valeta Gaudier for urgent left ureteral stent placement - CTX and vancomycin  administered in ED - Urine and blood cultures pending - Patient to be admitted to medicine service postoperatively - Continue foley  Thank you for this consult. Please contact the urology consult pager with any further questions/concerns.

## 2023-07-11 NOTE — Sepsis Progress Note (Signed)
 Elink will follow per sepsis protocol.

## 2023-07-11 NOTE — ED Triage Notes (Signed)
 Neck pain and headache x 3 days. Hurts worse with movement. Denies injury. Had surgery on neck approx 5 years ago. Pt feels pain go down arms and feels dizzy and blurred vision on and off for a day or so.

## 2023-07-11 NOTE — Op Note (Signed)
 Preoperative diagnosis:  Left ureteral calculus UTI   Postoperative diagnosis:  Left ureteral calculus UTI   Procedure:  Cystoscopy left ureteral stent placement - no string 6Fr x 26cm left retrograde pyelography with interpretation   Surgeon: Perley Bradley, MD  Assistant: Alphonza Ashing, MD  Anesthesia: General  Complications: None  Intraoperative findings:   Traumatic hypospadias left retrograde pyelography demonstrated a filling defect within the left distal ureter consistent with the patient's known calculus, proximal hydronephrosis and J hooking ureter Bilateral orthotopic UOs  EBL: Minimal  Specimens: None  Indication: [name] is a 58 y.o. patient with 4mm distal left ureteral calculus with hydronephrosis and UTI. After reviewing the management options for treatment, he elected to proceed with the above surgical procedure(s). We have discussed the potential benefits and risks of the procedure, side effects of the proposed treatment, the likelihood of the patient achieving the goals of the procedure, and any potential problems that might occur during the procedure or recuperation. Informed consent has been obtained.  Description of procedure:  The patient was taken to the operating room and general anesthesia was induced.  The patient was placed in the dorsal lithotomy position, prepped and draped in the usual sterile fashion, and preoperative antibiotics were administered. A preoperative time-out was performed.   Cystourethroscopy was performed.  The patient's urethra was examined and was normal. The bladder was then systematically examined in its entirety. There was no evidence for any bladder tumors, stones, or other mucosal pathology.    Attention then turned to the left ureteral orifice and a ureteral catheter was used to intubate the ureteral orifice.  Attempts at advancing sensor wire through the ureteral catheter was unsuccessful due to resistance.  Next, a  glide wire was then advanced through the ureteral catheter successfully and up to the kidney with flouroscopic guidance.  The open ended ureteral catheter could then be advanced over the wire.  A urine culture was obtained.  Omnipaque  contrast was injected through the ureteral catheter and a retrograde pyelogram was performed with findings as dictated above.  A 0.38 sensor guidewire was then advanced up the left ureter through open ended ureteral catheter into the renal pelvis under fluoroscopic guidance.  The open ended ureteral catheter was removed. The ureteral stent was advance over the wire using Seldinger technique.  The stent was positioned appropriately under fluoroscopic and cystoscopic guidance.  The wire was then removed with an adequate stent curl noted in the renal pelvis as well as in the bladder.  The bladder was then emptied and the procedure ended.  The foley catheter was replaced. The patient appeared to tolerate the procedure well and without complications.  The patient was able to be awakened and transferred to the recovery unit in satisfactory condition.    Perley Bradley, M.D.

## 2023-07-12 ENCOUNTER — Encounter (HOSPITAL_COMMUNITY): Payer: Self-pay | Admitting: Urology

## 2023-07-12 DIAGNOSIS — N201 Calculus of ureter: Principal | ICD-10-CM

## 2023-07-12 DIAGNOSIS — N200 Calculus of kidney: Secondary | ICD-10-CM | POA: Diagnosis not present

## 2023-07-12 DIAGNOSIS — N179 Acute kidney failure, unspecified: Secondary | ICD-10-CM | POA: Diagnosis present

## 2023-07-12 LAB — BLOOD CULTURE ID PANEL (REFLEXED) - BCID2

## 2023-07-12 LAB — CBC WITH DIFFERENTIAL/PLATELET
Abs Immature Granulocytes: 0.2 10*3/uL — ABNORMAL HIGH (ref 0.00–0.07)
Basophils Absolute: 0 10*3/uL (ref 0.0–0.1)
Basophils Relative: 0 %
Eosinophils Absolute: 0 10*3/uL (ref 0.0–0.5)
Eosinophils Relative: 0 %
HCT: 31.4 % — ABNORMAL LOW (ref 39.0–52.0)
Hemoglobin: 9.8 g/dL — ABNORMAL LOW (ref 13.0–17.0)
Immature Granulocytes: 1 %
Lymphocytes Relative: 4 %
Lymphs Abs: 0.7 10*3/uL (ref 0.7–4.0)
MCH: 25.7 pg — ABNORMAL LOW (ref 26.0–34.0)
MCHC: 31.2 g/dL (ref 30.0–36.0)
MCV: 82.2 fL (ref 80.0–100.0)
Monocytes Absolute: 0.5 10*3/uL (ref 0.1–1.0)
Monocytes Relative: 3 %
Neutro Abs: 14.1 10*3/uL — ABNORMAL HIGH (ref 1.7–7.7)
Neutrophils Relative %: 92 %
Platelets: 196 10*3/uL (ref 150–400)
RBC: 3.82 MIL/uL — ABNORMAL LOW (ref 4.22–5.81)
RDW: 16.1 % — ABNORMAL HIGH (ref 11.5–15.5)
Smear Review: NORMAL
WBC: 15.4 10*3/uL — ABNORMAL HIGH (ref 4.0–10.5)
nRBC: 0 % (ref 0.0–0.2)

## 2023-07-12 LAB — COMPREHENSIVE METABOLIC PANEL WITH GFR
ALT: 11 U/L (ref 0–44)
AST: 14 U/L — ABNORMAL LOW (ref 15–41)
Albumin: 2.7 g/dL — ABNORMAL LOW (ref 3.5–5.0)
Alkaline Phosphatase: 56 U/L (ref 38–126)
Anion gap: 8 (ref 5–15)
BUN: 24 mg/dL — ABNORMAL HIGH (ref 6–20)
CO2: 21 mmol/L — ABNORMAL LOW (ref 22–32)
Calcium: 7.6 mg/dL — ABNORMAL LOW (ref 8.9–10.3)
Chloride: 106 mmol/L (ref 98–111)
Creatinine, Ser: 1.32 mg/dL — ABNORMAL HIGH (ref 0.61–1.24)
GFR, Estimated: >60 mL/min (ref >60)
Glucose, Bld: 300 mg/dL — ABNORMAL HIGH (ref 70–99)
Potassium: 4.4 mmol/L (ref 3.5–5.1)
Sodium: 135 mmol/L (ref 135–145)
Total Bilirubin: 0.7 mg/dL (ref 0.0–1.2)
Total Protein: 6.5 g/dL (ref 6.5–8.1)

## 2023-07-12 LAB — C-REACTIVE PROTEIN: CRP: 31.5 mg/dL — ABNORMAL HIGH (ref <1.0)

## 2023-07-12 LAB — GLUCOSE, CAPILLARY
Glucose-Capillary: 179 mg/dL — ABNORMAL HIGH (ref 70–99)
Glucose-Capillary: 233 mg/dL — ABNORMAL HIGH (ref 70–99)
Glucose-Capillary: 273 mg/dL — ABNORMAL HIGH (ref 70–99)
Glucose-Capillary: 289 mg/dL — ABNORMAL HIGH (ref 70–99)
Glucose-Capillary: 302 mg/dL — ABNORMAL HIGH (ref 70–99)
Glucose-Capillary: 336 mg/dL — ABNORMAL HIGH (ref 70–99)

## 2023-07-12 LAB — MAGNESIUM: Magnesium: 1.6 mg/dL — ABNORMAL LOW (ref 1.7–2.4)

## 2023-07-12 LAB — LACTIC ACID, PLASMA: Lactic Acid, Venous: 1.3 mmol/L (ref 0.5–1.9)

## 2023-07-12 LAB — SEDIMENTATION RATE: Sed Rate: 93 mm/h — ABNORMAL HIGH (ref 0–16)

## 2023-07-12 MED ORDER — DAKINS (1/4 STRENGTH) 0.125 % EX SOLN
Freq: Every day | CUTANEOUS | Status: DC
  Filled 2023-07-12: qty 473

## 2023-07-12 MED ORDER — INSULIN ASPART 100 UNIT/ML IJ SOLN
0.0000 [IU] | Freq: Every day | INTRAMUSCULAR | Status: DC
  Administered 2023-07-12: 4 [IU] via SUBCUTANEOUS
  Administered 2023-07-13: 2 [IU] via SUBCUTANEOUS

## 2023-07-12 MED ORDER — INSULIN ASPART 100 UNIT/ML IJ SOLN
0.0000 [IU] | Freq: Three times a day (TID) | INTRAMUSCULAR | Status: DC
  Administered 2023-07-12: 11 [IU] via SUBCUTANEOUS
  Administered 2023-07-12: 7 [IU] via SUBCUTANEOUS
  Administered 2023-07-12: 15 [IU] via SUBCUTANEOUS
  Administered 2023-07-13: 11 [IU] via SUBCUTANEOUS
  Administered 2023-07-13 (×2): 7 [IU] via SUBCUTANEOUS
  Administered 2023-07-14: 4 [IU] via SUBCUTANEOUS
  Administered 2023-07-14 (×2): 7 [IU] via SUBCUTANEOUS
  Administered 2023-07-15: 4 [IU] via SUBCUTANEOUS

## 2023-07-12 MED ORDER — BACLOFEN 10 MG PO TABS: 10.0000 mg | ORAL_TABLET | Freq: Three times a day (TID) | ORAL | Status: DC | PRN

## 2023-07-12 MED ORDER — GABAPENTIN 300 MG PO CAPS
600.0000 mg | ORAL_CAPSULE | Freq: Three times a day (TID) | ORAL | Status: DC
  Administered 2023-07-12 – 2023-07-15 (×10): 600 mg via ORAL
  Filled 2023-07-12 (×11): qty 2

## 2023-07-12 MED ORDER — APIXABAN 5 MG PO TABS
5.0000 mg | ORAL_TABLET | Freq: Two times a day (BID) | ORAL | Status: DC
  Administered 2023-07-12 – 2023-07-15 (×7): 5 mg via ORAL
  Filled 2023-07-12 (×7): qty 1

## 2023-07-12 MED ORDER — MAGNESIUM SULFATE 2 GM/50ML IV SOLN
2.0000 g | Freq: Once | INTRAVENOUS | Status: AC
  Administered 2023-07-12: 2 g via INTRAVENOUS
  Filled 2023-07-12: qty 50

## 2023-07-12 MED ORDER — FLUOXETINE HCL 20 MG PO CAPS
20.0000 mg | ORAL_CAPSULE | Freq: Every day | ORAL | Status: DC
  Administered 2023-07-12 – 2023-07-15 (×4): 20 mg via ORAL
  Filled 2023-07-12 (×4): qty 1

## 2023-07-12 MED ORDER — ZINC OXIDE 40 % EX OINT
TOPICAL_OINTMENT | Freq: Two times a day (BID) | CUTANEOUS | Status: DC
  Filled 2023-07-12: qty 57

## 2023-07-12 MED ORDER — SODIUM CHLORIDE 0.9 % IV SOLN: INTRAVENOUS | Status: AC

## 2023-07-12 MED ORDER — SODIUM CHLORIDE 0.9 % IV SOLN
2.0000 g | Freq: Three times a day (TID) | INTRAVENOUS | Status: DC
  Filled 2023-07-12: qty 12.5

## 2023-07-12 MED ORDER — INSULIN GLARGINE-YFGN 100 UNIT/ML ~~LOC~~ SOLN
20.0000 [IU] | Freq: Every day | SUBCUTANEOUS | Status: DC
  Administered 2023-07-12: 20 [IU] via SUBCUTANEOUS
  Filled 2023-07-12 (×2): qty 0.2

## 2023-07-12 MED ORDER — BACLOFEN 10 MG PO TABS
20.0000 mg | ORAL_TABLET | Freq: Three times a day (TID) | ORAL | Status: DC
  Administered 2023-07-12 – 2023-07-15 (×10): 20 mg via ORAL
  Filled 2023-07-12 (×10): qty 2

## 2023-07-12 MED ORDER — SODIUM CHLORIDE 0.9 % IV SOLN
2.0000 g | INTRAVENOUS | Status: DC
  Administered 2023-07-12 – 2023-07-14 (×3): 2 g via INTRAVENOUS
  Filled 2023-07-12 (×3): qty 20

## 2023-07-12 MED ORDER — CHLORHEXIDINE GLUCONATE CLOTH 2 % EX PADS
6.0000 | MEDICATED_PAD | Freq: Every day | CUTANEOUS | Status: DC
  Administered 2023-07-12 – 2023-07-14 (×3): 6 via TOPICAL

## 2023-07-12 NOTE — Progress Notes (Signed)
 PHARMACY - PHYSICIAN COMMUNICATION CRITICAL VALUE ALERT - BLOOD CULTURE IDENTIFICATION (BCID)  [name] is an 58 y.o. male who presented to [facility] on 07/11/2023 with a chief complaint of fever/chills, possible osteomyelitis and s/p cytoscopy and L ureteral stent placement  Assessment:   Blood cultures growing E. coli  Name of physician (or Provider) Contacted:  Dr. Brock Canner  Current antibiotics:  Vancomycin  and Cefepime  and Flagyl   Changes to prescribed antibiotics recommended:  No changes recommended at this time.  Consider narrowing to Rocephin  if able once osteomyelitis workup complete   Results for orders placed or performed during the hospital encounter of 07/11/23  Blood Culture ID Panel (Reflexed) (Collected: 07/11/2023  4:04 PM)  Result Value Ref Range   Enterococcus faecalis NOT DETECTED NOT DETECTED   Enterococcus Faecium NOT DETECTED NOT DETECTED   Listeria monocytogenes NOT DETECTED NOT DETECTED   Staphylococcus species NOT DETECTED NOT DETECTED   Staphylococcus aureus (BCID) NOT DETECTED NOT DETECTED   Staphylococcus epidermidis NOT DETECTED NOT DETECTED   Staphylococcus lugdunensis NOT DETECTED NOT DETECTED   Streptococcus species NOT DETECTED NOT DETECTED   Streptococcus agalactiae NOT DETECTED NOT DETECTED   Streptococcus pneumoniae NOT DETECTED NOT DETECTED   Streptococcus pyogenes NOT DETECTED NOT DETECTED   A.calcoaceticus-baumannii NOT DETECTED NOT DETECTED   Bacteroides fragilis NOT DETECTED NOT DETECTED   Enterobacterales DETECTED (A) NOT DETECTED   Enterobacter cloacae complex NOT DETECTED NOT DETECTED   Escherichia coli DETECTED (A) NOT DETECTED   Klebsiella aerogenes NOT DETECTED NOT DETECTED   Klebsiella oxytoca NOT DETECTED NOT DETECTED   Klebsiella pneumoniae NOT DETECTED NOT DETECTED   Proteus species NOT DETECTED NOT DETECTED   Salmonella species NOT DETECTED NOT DETECTED   Serratia marcescens NOT DETECTED NOT DETECTED   Haemophilus  influenzae NOT DETECTED NOT DETECTED   Neisseria meningitidis NOT DETECTED NOT DETECTED   Pseudomonas aeruginosa NOT DETECTED NOT DETECTED   Stenotrophomonas maltophilia NOT DETECTED NOT DETECTED   Candida albicans NOT DETECTED NOT DETECTED   Candida auris NOT DETECTED NOT DETECTED   Candida glabrata NOT DETECTED NOT DETECTED   Candida krusei NOT DETECTED NOT DETECTED   Candida parapsilosis NOT DETECTED NOT DETECTED   Candida tropicalis NOT DETECTED NOT DETECTED   Cryptococcus neoformans/gattii NOT DETECTED NOT DETECTED   CTX-M ESBL NOT DETECTED NOT DETECTED   Carbapenem resistance IMP NOT DETECTED NOT DETECTED   Carbapenem resistance KPC NOT DETECTED NOT DETECTED   Carbapenem resistance NDM NOT DETECTED NOT DETECTED   Carbapenem resist OXA 48 LIKE NOT DETECTED NOT DETECTED   Carbapenem resistance VIM NOT DETECTED NOT DETECTED    Carlota Chestnut 07/12/2023  5:47 AM

## 2023-07-12 NOTE — Assessment & Plan Note (Signed)
 No bleeding observed - Trend Hgb

## 2023-07-12 NOTE — Plan of Care (Signed)

## 2023-07-12 NOTE — Assessment & Plan Note (Signed)
 Baseline Cr 0.6, here more than quadrupled to 2.4, due mainly to sepsis.  Creatinine improved to baseline.  Was elevated for greater than 48 hours. - Continue IV fluids - Hold furosemide , lisinopril 

## 2023-07-12 NOTE — Assessment & Plan Note (Signed)
 Discussed with Urology - Continue Eliquis 

## 2023-07-12 NOTE — Assessment & Plan Note (Signed)
 Stage IV sacral decubitus pressure wound, present on admission Follows with outpatient wound care Wound is at baseline - Continue home wound care regimen - Air loss mattress

## 2023-07-12 NOTE — Assessment & Plan Note (Signed)
 BMI 39 in setting of comorbid HTN, diabetes

## 2023-07-12 NOTE — Progress Notes (Signed)
  Progress Note   Patient: [name] RUE:[id number] DOB: 1965/03/15 DOA: 07/11/2023     1 DOS: the patient was seen and examined on 07/12/2023       Brief hospital course: 58 y.o. 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     Assessment and Plan: Severe sepsis with end organ damage Due to E coli bactermia due to UTI and nephrolithiasis due to indwelling catheter, present on admission  Presented with leukocytosis, fever, hypotension, SOFA score 2.  Also AKI which was primarily sepsis mediated.  Blood cultures growing E coli - Continue ROcephin  - FOllow sensitivities    AKI Basleine Cr 0.6, here more than quadrupled to 2.4, due mainly to sepsis.  Cr improved overnight with fluids, still >2x baseline. - Continue IV fluids - Hold furoemide, lisinopril   Paraplegia Spasms - Continue baclofen   Sacral decubitus ulcer Sacral osteomyelitis, chronic Pressure related, present on admission, stage IV Follows with outpatient wound care Wound is at baseline - Continue home wound care regimen - Air loss mattress  Hypertension BP normal - Hold lisinopril , furosemide   History DVT Discussed with URology - Resume Eliquis   Morbid obesity BMI 39 in setting of comorbid HTN, diabetes  Diabetes with hyperglycemia - Resume glargine - Increase SS corrections - Hold glipizide   Normocytnic anemia No bleeding observed - Trend Hgb  Hypomagnesemia - Supplement Mag  Nephrolithiasis S/p cystoscopy and ureteral stent on 5/30 by Urology - Outpatietn follow up for stent removal       Subjective: Feels better.  Some headache, neck aches today.  No fever, no confusion, no vomiting.  Wound is at baseline.       Physical Exam: BP 136/83 (BP Location: Right Wrist)   Pulse 75   Temp 98.8 F (37.1 C)  (Oral)   Resp 20   Wt 113.7 kg   SpO2 95%   BMI 39.26 kg/m   Obese adult male, lying in bed, no acute distress RRR, no murmurs, no peripheral edema Respiratory rate normal, lungs clear without rales or wheezes Abdomen soft no tenderness palpation or guarding Paraplegic, interactive, appropriate, upper extremity strength appears near normal, with some hypothenar wasting, face symmetric, speech fluent All history collected through video phonic interpreter [phone] and [phone]  Data Reviewed: Discussed with Urology BMP shows Cr improving CBC shows leukocytosis anemia  Family Communication: Wife    Disposition: Status is: Inpatient         Author: Ephriam Hashimoto, MD 07/12/2023 3:38 PM  For on call review [email or web address].

## 2023-07-12 NOTE — Assessment & Plan Note (Signed)
 Due to E coli bactermia due to UTI and nephrolithiasis due to indwelling catheter, present on admission   Presented with leukocytosis, fever, hypotension, SOFA score 2.  Also AKI which was primarily sepsis mediated.   Blood cultures growing E coli - Continue Rocephin  - Follow sensitivities

## 2023-07-12 NOTE — Plan of Care (Signed)
  Problem: Fluid Volume: Goal: Ability to maintain a balanced intake and output will improve Outcome: Progressing   Problem: Metabolic: Goal: Ability to maintain appropriate glucose levels will improve Outcome: Progressing   Problem: Tissue Perfusion: Goal: Adequacy of tissue perfusion will improve Outcome: Progressing   Problem: Education: Goal: Knowledge of General Education information will improve Description: Including pain rating scale, medication(s)/side effects and non-pharmacologic comfort measures Outcome: Progressing

## 2023-07-12 NOTE — Assessment & Plan Note (Signed)
 S/p cystoscopy and ureteral stent on 5/30 by Urology - Outpatietn follow up for stent removal

## 2023-07-12 NOTE — Hospital Course (Addendum)
 58 y.o. 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

## 2023-07-12 NOTE — Assessment & Plan Note (Signed)
 Remains hyperglycemic - Continue glargine - Continue sliding scale corrections - Resume home glipizide  lower than home dose

## 2023-07-12 NOTE — Progress Notes (Signed)
 1 Day Post-Op Subjective: Patient reporting neck pain this morning. Remained afebrile overnight, tolerating regular diet.   Objective: Vital signs in last 24 hours: Temp:  [97.3 F (36.3 C)-101.8 F (38.8 C)] 97.6 F (36.4 C) (05/31 0815) Pulse Rate:  [75-113] 75 (05/31 0638) Resp:  [12-21] 17 (05/31 0815) BP: (62-137)/(35-89) 136/83 (05/31 0815) SpO2:  [84 %-100 %] 95 % (05/31 1610) Weight:  [113.7 kg-115.1 kg] 113.7 kg (05/31 0337)  Assessment/Plan:  57yM with hx of quadriplegia and nephrolithiasis now s/p left ureteral stent placement for infected 4mm distal left ureteral stone.  #distal left ureteral stone #urosepsis #neurogenic bladder - Ureteral stent placed 5/30 - Blood cultures growing enterobacterales and E coli, on rocephin  - Urine culture pending - Continue foley catheter (patient is chronically foley dependent) - Will arrange for outpatient stone surgery in the next 3-4 weeks after adequate treatment of his urosepsis - okay to resume eliquis  - all other care per primary   Intake/Output from previous day: 05/30 0701 - 05/31 0700 In: 4889.4 [P.O.:360; I.V.:630.9; IV Piggyback:3898.5] Out: 2975 [Urine:2975]  Intake/Output this shift: Total I/O In: 708.4 [I.V.:708.4] Out: 325 [Urine:325]  Physical Exam:  General: Alert and oriented CV: No cyanosis Lungs: equal chest rise on nasal cannula Abdomen: Soft, NTND, no rebound or Gu: foley in place draining clear yellow urine  Lab Results: Recent Labs    07/11/23 1313 07/12/23 0455  HGB 10.2* 9.8*  HCT 32.2* 31.4*   BMET Recent Labs    07/11/23 1313 07/12/23 0455  NA 133* 135  K 4.1 4.4  CL 102 106  CO2 20* 21*  GLUCOSE 239* 300*  BUN 32* 24*  CREATININE 2.40* 1.32*  CALCIUM 7.9* 7.6*     Studies/Results: DG C-Arm 1-60 Min-No Report Result Date: 07/11/2023 Fluoroscopy was utilized by the requesting physician.  No radiographic interpretation.   CT ABDOMEN PELVIS WO CONTRAST Result Date:  07/11/2023 CLINICAL DATA:  Sepsis.  Neck pain. EXAM: CT ABDOMEN AND PELVIS WITHOUT CONTRAST TECHNIQUE: Multidetector CT imaging of the abdomen and pelvis was performed following the standard protocol without IV contrast. RADIATION DOSE REDUCTION: This exam was performed according to the departmental dose-optimization program which includes automated exposure control, adjustment of the mA and/or kV according to patient size and/or use of iterative reconstruction technique. COMPARISON:  10/02/2022 FINDINGS: Lower chest: Linear infiltration in the lung bases, greater on the left. Peribronchial thickening may represent acute or chronic bronchitis. Hepatobiliary: Cholelithiasis. No inflammatory changes. No bile duct dilatation. No focal liver lesions identified. Pancreas: Unremarkable. No pancreatic ductal dilatation or surrounding inflammatory changes. Spleen: Normal in size without focal abnormality. Adrenals/Urinary Tract: No adrenal gland nodules. 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 Has there been a history of interval lithotripsy? 4 mm stone in the distal left ureter about 1 cm above the ureterovesical junction. There is proximal hydronephrosis and hydroureter. Prominent stranding around the left ureter and left kidney. Left kidney is enlarged compared with the right. These are all likely obstructive changes but the stranding could also indicate superimposed infection or may be related to recent procedure if any. There is also gas in the renal collecting system and left ureter. This could result from instrumentation or may indicate infection with gas-forming organism. A Foley catheter decompresses the bladder. The right kidney and ureter are normal. Stomach/Bowel: Stomach, small bowel, and colon are not abnormally distended. Colon is diffusely stool-filled. No wall thickening is identified. Mild stranding in  the left pericolic gutter is  likely related to the renal process. The appendix is normal. Vascular/Lymphatic: Calcification of the aorta. No aneurysm. Inferior vena caval filter is present. Reproductive: Prostate gland is either atrophic or surgically absent. Other: No free air or free fluid in the abdomen. Abdominal wall musculature appears intact. Fatty atrophy of the hip musculature. Musculoskeletal: Degenerative changes in the spine. Skin defect with gas and debris filled cavity over the lower sacral coccygeal region and extending from bone disc in surface. There is some bone erosion. This suggest decubitus ulceration with osteomyelitis. There is mild progression in the size of the cavity since the previous study. IMPRESSION: 1. Interval loss of prior staghorn calculus with multiple small stones in the left kidney, possibly due to interval lithotripsy. Correlate with clinical history. 2. 4 mm stone in the distal left ureter with moderate proximal obstruction. 3. Stranding around the left kidney and ureter with gas in the left intrarenal collecting system and left ureter, possibly due to instrumentation, obstruction, and/or infection. Correlate with urinalysis. 4. Foley catheter decompresses the bladder. 5. Deep decubitus ulceration with bone loss over the sacral coccygeal region suggesting underlying osteomyelitis. 6. Cholelithiasis without evidence of acute cholecystitis. Electronically Signed   By: Boyce Byes M.D.   On: 07/11/2023 18:37   DG Chest Portable 1 View Result Date: 07/11/2023 CLINICAL DATA:  Cough. EXAM: PORTABLE CHEST 1 VIEW COMPARISON:  10/02/2022 FINDINGS: The cardiomediastinal contours are normal. The lungs are clear. Pulmonary vasculature is normal. No consolidation, pleural effusion, or pneumothorax. Thoracolumbar fusion hardware. No acute osseous abnormalities are seen. IMPRESSION: No acute chest findings. Electronically Signed   By: Chadwick Colonel M.D.   On: 07/11/2023 17:55      LOS: 1 day   Harry Ashing, MD [facility] Resident  [facility] Urology   Dr. Valeta Gaudier is the attending of record for this consult.    07/12/2023, 11:11 AM

## 2023-07-12 NOTE — Assessment & Plan Note (Signed)
 Blood pressure trending up - Hold lisinopril , furosemide 

## 2023-07-12 NOTE — Consult Note (Signed)
 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; last seen at [facility] 07/01/2023 and Dakin's being utilized  Reason for Consult: sacral wound  Wound type: Stage 4 Pressure Injury  Pressure Injury POA: Yes Measurement: see nursing flowsheet; per last notes WCC 4.9 cm x 3 cm x 3.9 cm  Wound bed: appears clean  Drainage (amount, consistency, odor) per nursing flowsheet  Periwound:scar tissue and some maceration  Dressing procedure/placement/frequency: Cleanse sacral wound with NS, using a Q tip applicator insert Dakin's moistened gauze into wound bed daily making sure to cover depth of wound, cover with dry gauze and ABD pad or silicone foam to secure.    Will write for Desitin to surrounding intact skin. Patient should be placed on a low air loss mattress for pressure redistribution and moisture management.   POC discussed with bedside nurse. WOC team will not follow  Patient should resume follow-up with WCC at discharge.   Thank you,    Ronni Colace MSN, RN-BC, [employer] [phone]

## 2023-07-12 NOTE — Progress Notes (Signed)
 Urology Inpatient Progress Report     Intv/Subj: Patient doing well after emergent left ureteral stent last night.  He does have some back pain.  Principal Problem:   Kidney stone on left side Active Problems:   Severe sepsis (HCC)   DM2 (diabetes mellitus, type 2) (HCC)   History of DVT (deep vein thrombosis)   Osteomyelitis (HCC)   Anemia of chronic disease   Pyelonephritis of left kidney   AKI (acute kidney injury) (HCC)   History of essential hypertension   Sacral osteomyelitis (HCC)   Calculus of ureter  Current Facility-Administered Medications  Medication Dose Route Frequency Provider Last Rate Last Admin   acetaminophen  (TYLENOL ) tablet 650 mg  650 mg Oral Q6H PRN Howerter, Justin B, DO       Or   acetaminophen  (TYLENOL ) suppository 650 mg  650 mg Rectal Q6H PRN Howerter, Justin B, DO       ceFEPIme  (MAXIPIME ) 2 g in sodium chloride  0.9 % 100 mL IVPB  2 g Intravenous Q8H Roskowski, Stephanie, RPH       Chlorhexidine  Gluconate Cloth 2 % PADS 6 each  6 each Topical Daily Howerter, Justin B, DO       fentaNYL  (SUBLIMAZE ) injection 25 mcg  25 mcg Intravenous Q2H PRN Howerter, Justin B, DO       HYDROmorphone  (DILAUDID ) injection 0.5 mg  0.5 mg Intravenous Once Naasz, Hayley N, MD       insulin  aspart (novoLOG ) injection 0-20 Units  0-20 Units Subcutaneous TID WC Danford, Christopher P, MD   15 Units at 07/12/23 0831   insulin  aspart (novoLOG ) injection 0-5 Units  0-5 Units Subcutaneous QHS Danford, Willis Harter, MD       insulin  glargine-yfgn (SEMGLEE ) injection 10 Units  10 Units Subcutaneous Q2200 Howerter, Justin B, DO       liver oil-zinc oxide (DESITIN) 40 % ointment   Topical BID Danford, Willis Harter, MD       metroNIDAZOLE  (FLAGYL ) IVPB 500 mg  500 mg Intravenous Q12H Howerter, Justin B, DO   Stopped at 07/12/23 1610   naloxone  (NARCAN ) injection 0.4 mg  0.4 mg Intravenous PRN Howerter, Justin B, DO       ondansetron  (ZOFRAN ) injection 4 mg  4 mg Intravenous Q6H PRN  Howerter, Justin B, DO       sodium hypochlorite (DAKIN'S 1/4 STRENGTH) topical solution   Irrigation Daily Danford, Willis Harter, MD       vancomycin  variable dose per unstable renal function (pharmacist dosing)   Does not apply See admin instructions Howerter, Justin B, DO         Objective: Vital: Vitals:   07/12/23 0208 07/12/23 0337 07/12/23 0638 07/12/23 0815  BP: 105/65 113/67 137/77 136/83  Pulse: 81 84 75   Resp:  (!) 21  17  Temp:  97.7 F (36.5 C)  97.6 F (36.4 C)  TempSrc:  Oral  Oral  SpO2: 99% 96% 95%   Weight:  113.7 kg     I/Os: I/O last 3 completed shifts: In: 4889.4 [P.O.:360; I.V.:630.9; IV Piggyback:3898.5] Out: 2975 [Urine:2975]  Physical Exam:  General: Patient is in no apparent distress Lungs: Normal respiratory effort, chest expands symmetrically. GI: abdomen is soft and nontender  Foley: draining yellow urine  Ext: lower extremities symmetric  Lab Results: Recent Labs    07/11/23 1313 07/12/23 0455  WBC 19.0* 15.4*  HGB 10.2* 9.8*  HCT 32.2* 31.4*   Recent Labs    07/11/23 1313 07/12/23 0455  NA 133* 135  K 4.1 4.4  CL 102 106  CO2 20* 21*  GLUCOSE 239* 300*  BUN 32* 24*  CREATININE 2.40* 1.32*  CALCIUM 7.9* 7.6*   Recent Labs    07/11/23 2243  INR 2.1*   No results for input(s): "LABURIN" in the last 72 hours. Results for orders placed or performed during the hospital encounter of 07/11/23  Culture, blood (routine x 2)     Status: None (Preliminary result)   Collection Time: 07/11/23  3:55 PM   Specimen: BLOOD RIGHT WRIST  Result Value Ref Range Status   Specimen Description BLOOD RIGHT WRIST  Final   Special Requests   Final    BOTTLES DRAWN AEROBIC AND ANAEROBIC Blood Culture results may not be optimal due to an inadequate volume of blood received in culture bottles   Culture  Setup Time   Final    GRAM NEGATIVE RODS IN BOTH AEROBIC AND ANAEROBIC BOTTLES CRITICAL VALUE NOTED.  VALUE IS CONSISTENT WITH PREVIOUSLY  REPORTED AND CALLED VALUE. Performed at [facility], [address] [place], [place] [address]    Culture GRAM NEGATIVE RODS  Final   Report Status PENDING  Incomplete  Culture, blood (routine x 2)     Status: None (Preliminary result)   Collection Time: 07/11/23  4:04 PM   Specimen: BLOOD  Result Value Ref Range Status   Specimen Description BLOOD RIGHT ANTECUBITAL  Final   Special Requests   Final    BOTTLES DRAWN AEROBIC AND ANAEROBIC Blood Culture adequate volume   Culture  Setup Time   Final    GRAM NEGATIVE RODS ANAEROBIC BOTTLE ONLY CRITICAL RESULT CALLED TO, READ BACK BY AND VERIFIED WITH: PHARMD G ABBOTT 07/12/2023 @ 0543 BY AB Performed at [facility], [address], [place], [place] [address]    Culture GRAM NEGATIVE RODS  Final   Report Status PENDING  Incomplete  Blood Culture ID Panel (Reflexed)     Status: Abnormal   Collection Time: 07/11/23  4:04 PM  Result Value Ref Range Status   Enterococcus faecalis NOT DETECTED NOT DETECTED Final   Enterococcus Faecium NOT DETECTED NOT DETECTED Final   Listeria monocytogenes NOT DETECTED NOT DETECTED Final   Staphylococcus species NOT DETECTED NOT DETECTED Final   Staphylococcus aureus (BCID) NOT DETECTED NOT DETECTED Final   Staphylococcus epidermidis NOT DETECTED NOT DETECTED Final   Staphylococcus lugdunensis NOT DETECTED NOT DETECTED Final   Streptococcus species NOT DETECTED NOT DETECTED Final   Streptococcus agalactiae NOT DETECTED NOT DETECTED Final   Streptococcus pneumoniae NOT DETECTED NOT DETECTED Final   Streptococcus pyogenes NOT DETECTED NOT DETECTED Final   A.calcoaceticus-baumannii NOT DETECTED NOT DETECTED Final   Bacteroides fragilis NOT DETECTED NOT DETECTED Final   Enterobacterales DETECTED (A) NOT DETECTED Final    Comment: Enterobacterales represent a large order of gram negative bacteria, not a single organism. CRITICAL RESULT CALLED TO, READ BACK BY AND VERIFIED WITH: PHARMD G ABBOTT  07/12/2023 @ 0543 BY AB    Enterobacter cloacae complex NOT DETECTED NOT DETECTED Final   Escherichia coli DETECTED (A) NOT DETECTED Final    Comment: CRITICAL RESULT CALLED TO, READ BACK BY AND VERIFIED WITH: PHARMD G ABBOTT 07/12/2023 @ 0543 BY AB    Klebsiella aerogenes NOT DETECTED NOT DETECTED Final   Klebsiella oxytoca NOT DETECTED NOT DETECTED Final   Klebsiella pneumoniae NOT DETECTED NOT DETECTED Final   Proteus species NOT DETECTED NOT DETECTED Final  Salmonella species NOT DETECTED NOT DETECTED Final   Serratia marcescens NOT DETECTED NOT DETECTED Final   Haemophilus influenzae NOT DETECTED NOT DETECTED Final   Neisseria meningitidis NOT DETECTED NOT DETECTED Final   Pseudomonas aeruginosa NOT DETECTED NOT DETECTED Final   Stenotrophomonas maltophilia NOT DETECTED NOT DETECTED Final   Candida albicans NOT DETECTED NOT DETECTED Final   Candida auris NOT DETECTED NOT DETECTED Final   Candida glabrata NOT DETECTED NOT DETECTED Final   Candida krusei NOT DETECTED NOT DETECTED Final   Candida parapsilosis NOT DETECTED NOT DETECTED Final   Candida tropicalis NOT DETECTED NOT DETECTED Final   Cryptococcus neoformans/gattii NOT DETECTED NOT DETECTED Final   CTX-M ESBL NOT DETECTED NOT DETECTED Final   Carbapenem resistance IMP NOT DETECTED NOT DETECTED Final   Carbapenem resistance KPC NOT DETECTED NOT DETECTED Final   Carbapenem resistance NDM NOT DETECTED NOT DETECTED Final   Carbapenem resist OXA 48 LIKE NOT DETECTED NOT DETECTED Final   Carbapenem resistance VIM NOT DETECTED NOT DETECTED Final    Comment: Performed at [facility], [address] [place], [place] [address]    Studies/Results: DG C-Arm 1-60 Min-No Report Result Date: 07/11/2023 Fluoroscopy was utilized by the requesting physician.  No radiographic interpretation.   CT ABDOMEN PELVIS WO CONTRAST Result Date: 07/11/2023 CLINICAL DATA:  Sepsis.  Neck pain. EXAM: CT ABDOMEN AND PELVIS WITHOUT CONTRAST  TECHNIQUE: Multidetector CT imaging of the abdomen and pelvis was performed following the standard protocol without IV contrast. RADIATION DOSE REDUCTION: This exam was performed according to the departmental dose-optimization program which includes automated exposure control, adjustment of the mA and/or kV according to patient size and/or use of iterative reconstruction technique. COMPARISON:  10/02/2022 FINDINGS: Lower chest: Linear infiltration in the lung bases, greater on the left. Peribronchial thickening may represent acute or chronic bronchitis. Hepatobiliary: Cholelithiasis. No inflammatory changes. No bile duct dilatation. No focal liver lesions identified. Pancreas: Unremarkable. No pancreatic ductal dilatation or surrounding inflammatory changes. Spleen: Normal in size without focal abnormality. Adrenals/Urinary Tract: No adrenal gland nodules. 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 Has there been a history of interval lithotripsy? 4 mm stone in the distal left ureter about 1 cm above the ureterovesical junction. There is proximal hydronephrosis and hydroureter. Prominent stranding around the left ureter and left kidney. Left kidney is enlarged compared with the right. These are all likely obstructive changes but the stranding could also indicate superimposed infection or may be related to recent procedure if any. There is also gas in the renal collecting system and left ureter. This could result from instrumentation or may indicate infection with gas-forming organism. A Foley catheter decompresses the bladder. The right kidney and ureter are normal. Stomach/Bowel: Stomach, small bowel, and colon are not abnormally distended. Colon is diffusely stool-filled. No wall thickening is identified. Mild stranding in the left pericolic gutter is likely related to the renal process. The appendix is normal. Vascular/Lymphatic: Calcification  of the aorta. No aneurysm. Inferior vena caval filter is present. Reproductive: Prostate gland is either atrophic or surgically absent. Other: No free air or free fluid in the abdomen. Abdominal wall musculature appears intact. Fatty atrophy of the hip musculature. Musculoskeletal: Degenerative changes in the spine. Skin defect with gas and debris filled cavity over the lower sacral coccygeal region and extending from bone disc in surface. There is some bone erosion. This suggest decubitus ulceration with osteomyelitis. There is mild progression  in the size of the cavity since the previous study. IMPRESSION: 1. Interval loss of prior staghorn calculus with multiple small stones in the left kidney, possibly due to interval lithotripsy. Correlate with clinical history. 2. 4 mm stone in the distal left ureter with moderate proximal obstruction. 3. Stranding around the left kidney and ureter with gas in the left intrarenal collecting system and left ureter, possibly due to instrumentation, obstruction, and/or infection. Correlate with urinalysis. 4. Foley catheter decompresses the bladder. 5. Deep decubitus ulceration with bone loss over the sacral coccygeal region suggesting underlying osteomyelitis. 6. Cholelithiasis without evidence of acute cholecystitis. Electronically Signed   By: Boyce Byes M.D.   On: 07/11/2023 18:37   DG Chest Portable 1 View Result Date: 07/11/2023 CLINICAL DATA:  Cough. EXAM: PORTABLE CHEST 1 VIEW COMPARISON:  10/02/2022 FINDINGS: The cardiomediastinal contours are normal. The lungs are clear. Pulmonary vasculature is normal. No consolidation, pleural effusion, or pneumothorax. Thoracolumbar fusion hardware. No acute osseous abnormalities are seen. IMPRESSION: No acute chest findings. Electronically Signed   By: Chadwick Colonel M.D.   On: 07/11/2023 17:55    Assessment/Plan: Left obstructing ureteral calculus with UTI/sepsis -POD1 s/p left ureteral stent -continue antibiotics  and tailor according to urine culture speciation/sensitivities - will need outpatient ureteroscopy for definitive management of stone   Perley Bradley, MD Urology 07/12/2023, 9:01 AM

## 2023-07-12 NOTE — Assessment & Plan Note (Signed)
 Spasms - Continue baclofen 

## 2023-07-13 DIAGNOSIS — R6520 Severe sepsis without septic shock: Secondary | ICD-10-CM | POA: Diagnosis not present

## 2023-07-13 DIAGNOSIS — A419 Sepsis, unspecified organism: Secondary | ICD-10-CM | POA: Diagnosis not present

## 2023-07-13 LAB — COMPREHENSIVE METABOLIC PANEL WITH GFR
ALT: 12 U/L (ref 0–44)
AST: 11 U/L — ABNORMAL LOW (ref 15–41)
Albumin: 2.5 g/dL — ABNORMAL LOW (ref 3.5–5.0)
Alkaline Phosphatase: 60 U/L (ref 38–126)
Anion gap: 8 (ref 5–15)
BUN: 20 mg/dL (ref 6–20)
CO2: 23 mmol/L (ref 22–32)
Calcium: 8.2 mg/dL — ABNORMAL LOW (ref 8.9–10.3)
Chloride: 105 mmol/L (ref 98–111)
Creatinine, Ser: 0.91 mg/dL (ref 0.61–1.24)
GFR, Estimated: >60 mL/min (ref >60)
Glucose, Bld: 325 mg/dL — ABNORMAL HIGH (ref 70–99)
Potassium: 4.3 mmol/L (ref 3.5–5.1)
Sodium: 136 mmol/L (ref 135–145)
Total Bilirubin: 0.4 mg/dL (ref 0.0–1.2)
Total Protein: 6.7 g/dL (ref 6.5–8.1)

## 2023-07-13 LAB — MAGNESIUM: Magnesium: 1.8 mg/dL (ref 1.7–2.4)

## 2023-07-13 LAB — CBC
HCT: 30.3 % — ABNORMAL LOW (ref 39.0–52.0)
Hemoglobin: 9.5 g/dL — ABNORMAL LOW (ref 13.0–17.0)
MCH: 25.7 pg — ABNORMAL LOW (ref 26.0–34.0)
MCHC: 31.4 g/dL (ref 30.0–36.0)
MCV: 82.1 fL (ref 80.0–100.0)
Platelets: 238 10*3/uL (ref 150–400)
RBC: 3.69 MIL/uL — ABNORMAL LOW (ref 4.22–5.81)
RDW: 16.3 % — ABNORMAL HIGH (ref 11.5–15.5)
WBC: 9.5 10*3/uL (ref 4.0–10.5)
nRBC: 0 % (ref 0.0–0.2)

## 2023-07-13 LAB — GLUCOSE, CAPILLARY
Glucose-Capillary: 208 mg/dL — ABNORMAL HIGH (ref 70–99)
Glucose-Capillary: 241 mg/dL — ABNORMAL HIGH (ref 70–99)
Glucose-Capillary: 247 mg/dL — ABNORMAL HIGH (ref 70–99)
Glucose-Capillary: 262 mg/dL — ABNORMAL HIGH (ref 70–99)
Glucose-Capillary: 294 mg/dL — ABNORMAL HIGH (ref 70–99)
Glucose-Capillary: 320 mg/dL — ABNORMAL HIGH (ref 70–99)

## 2023-07-13 MED ORDER — GLIPIZIDE 5 MG PO TABS
5.0000 mg | ORAL_TABLET | Freq: Two times a day (BID) | ORAL | Status: DC
  Administered 2023-07-13 – 2023-07-15 (×4): 5 mg via ORAL
  Filled 2023-07-13 (×4): qty 1

## 2023-07-13 MED ORDER — POLYVINYL ALCOHOL 1.4 % OP SOLN: 1.0000 [drp] | OPHTHALMIC | Status: DC | PRN

## 2023-07-13 MED ORDER — INSULIN GLARGINE-YFGN 100 UNIT/ML ~~LOC~~ SOLN
30.0000 [IU] | Freq: Every day | SUBCUTANEOUS | Status: DC
  Administered 2023-07-13 – 2023-07-14 (×2): 30 [IU] via SUBCUTANEOUS
  Filled 2023-07-13 (×3): qty 0.3

## 2023-07-13 NOTE — Evaluation (Signed)
 Occupational Therapy Evaluation Patient Details Name: [name] MRN: [id number] DOB: 1965/05/10 Today's Date: 07/13/2023   History of Present Illness   58 y.o. male presents to [facility] 07/11/23 with severe sepsis 2/2 UTI 2/2 obstructing L ureterolithiasis. 5/30 cystoscopy with retrograde pyelogram and uretal stent insertion. 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     Clinical Impressions Pt uses hoyer lift at baseline to transfer OOB to power w/c, per spouse pt sits up in w/c for 1 hour to eat and then returns to bed due to sacral wound pressure. Pt currently close to baseline needing up to total A for ADLs, and max +2 for bed mobility to turn to R for wound care. Transferred pt to air bed with total +4. Pt presenting with impairments listed below, however has no acute OT needs at this time, will s/o. Please reconsult if there is a change in pt status. Anticipate no OT follow up needs at d/c.      If plan is discharge home, recommend the following:   Two people to help with walking and/or transfers;Two people to help with bathing/dressing/bathroom;Assist for transportation;Help with stairs or ramp for entrance;Assistance with cooking/housework     Functional Status Assessment   Patient has had a recent decline in their functional status and demonstrates the ability to make significant improvements in function in a reasonable and predictable amount of time.     Equipment Recommendations   None recommended by OT (pt has all needed DME)     Recommendations for Other Services         Precautions/Restrictions   Precautions Precautions: Fall Precaution/Restrictions Comments: C8 Paraplegia Restrictions Weight Bearing Restrictions Per Provider Order: No     Mobility Bed Mobility Overal bed mobility: Needs  Assistance Bed Mobility: Rolling Rolling: Max assist, Used rails, +2 for physical assistance         General bed mobility comments: MaxAx2 to roll with use of bed pad. Pt able to hold self in sidelying with CGA for safety. TotalAx4 to laterally scoot from bed to air mattress    Transfers                   General transfer comment: Deferred, uses hoyer lift at baseline      Balance                                           ADL either performed or assessed with clinical judgement   ADL Overall ADL's : Needs assistance/impaired Eating/Feeding: Set up;Sitting;Bed level   Grooming: Set up;Sitting;Bed level   Upper Body Bathing: Moderate assistance   Lower Body Bathing: Maximal assistance   Upper Body Dressing : Moderate assistance   Lower Body Dressing: Total assistance   Toilet Transfer: Total assistance;+2 for physical assistance   Toileting- Clothing Manipulation and Hygiene: Total assistance       Functional mobility during ADLs: Total assistance;+2 for physical assistance       Vision   Vision Assessment?: No apparent visual deficits     Perception Perception: Not tested       Praxis Praxis: Not tested       Pertinent Vitals/Pain Pain Assessment Pain Assessment: No/denies pain     Extremity/Trunk Assessment Upper Extremity Assessment Upper Extremity Assessment:  Generalized weakness   Lower Extremity Assessment Lower Extremity Assessment: Defer to PT evaluation (spasticity) RLE Deficits / Details: No active movement, C8 paraplegia LLE Deficits / Details: No active movement, C8 paraplegia   Cervical / Trunk Assessment Cervical / Trunk Assessment: Normal   Communication Communication Communication: Impaired Factors Affecting Communication: Difficulty expressing self (Spanish is primary language, however, able to comprehend and respond to most questions in english)   Cognition Arousal: Alert Behavior During Therapy:  WFL for tasks assessed/performed Cognition: No apparent impairments                               Following commands: Intact       Cueing  General Comments   Cueing Techniques: Verbal cues  VSS on RA   Exercises     Shoulder Instructions      Home Living Family/patient expects to be discharged to:: Private residence Living Arrangements: Spouse/significant other Available Help at Discharge: Family;Available 24 hours/day Type of Home: House Home Access: Ramped entrance     Home Layout: Able to live on main level with bedroom/bathroom     Bathroom Shower/Tub: Sponge bathes at baseline     Bathroom Accessibility: Yes   Home Equipment: Hospital bed;Wheelchair - power;Hand held shower head;Shower seat          Prior Functioning/Environment Prior Level of Function : Needs assist             Mobility Comments: uses hoyerlift to get to power WC at least once a day. Has recently been only staying in power [facility] for an hour due to sacral wound. Has bicycle for legs that does PROM ADLs Comments: has been doing sponge baths due to sacral wound. Wife has been assisting with wound care    OT Problem List: Decreased strength;Decreased range of motion;Decreased activity tolerance;Impaired balance (sitting and/or standing);Impaired tone;Impaired sensation   OT Treatment/Interventions:        OT Goals(Current goals can be found in the care plan section)   Acute Rehab OT Goals Patient Stated Goal: did not state OT Goal Formulation: With patient Time For Goal Achievement: 07/27/23 Potential to Achieve Goals: Good   OT Frequency:       Co-evaluation PT/OT/SLP Co-Evaluation/Treatment: Yes Reason for Co-Treatment: For patient/therapist safety;To address functional/ADL transfers PT goals addressed during session: Mobility/safety with mobility;Strengthening/ROM OT goals addressed during session: ADL's and self-care      AM-PAC OT "6 Clicks" Daily Activity      Outcome Measure Help from another person eating meals?: A Little Help from another person taking care of personal grooming?: A Little Help from another person toileting, which includes using toliet, bedpan, or urinal?: Total Help from another person bathing (including washing, rinsing, drying)?: A Lot Help from another person to put on and taking off regular upper body clothing?: A Lot Help from another person to put on and taking off regular lower body clothing?: Total 6 Click Score: 12   End of Session Nurse Communication: Mobility status  Activity Tolerance: Patient tolerated treatment well Patient left: in bed;with call bell/phone within reach;with family/visitor present  OT Visit Diagnosis: Muscle weakness (generalized) (M62.81)                Time: 1610-9604 OT Time Calculation (min): 27 min Charges:  OT General Charges $OT Visit: 1 Visit OT Evaluation $OT Eval Low Complexity: 1 Low  Rainey Rodger K, OTD, OTR/L SecureChat Preferred Acute Rehab [phone]   Mittie Knittel K Koonce 07/13/2023, 2:31 PM

## 2023-07-13 NOTE — Evaluation (Signed)
 Physical Therapy Evaluation Patient Details Name: [name] MRN: [id number] DOB: 06-26-1965 Today's Date: 07/13/2023  History of Present Illness  58 y.o. male presents to [facility] 07/11/23 with severe sepsis 2/2 UTI 2/2 obstructing L ureterolithiasis. 5/30 cystoscopy with retrograde pyelogram and uretal stent insertion. 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   Clinical Impression  Pt in bed upon arrival and agreeable to PT eval. PTA, pt would transfer to a power WC using a hoyer lift. In today's session, pt was able to roll with MaxAx2 and use of bed pad. Transferred pt from standard bed to air mattress with TotalAx4 using lateral slide with bed pads. Pt and wife reported having necessary equipment at home with help available 24/7 from family. Pt and wife feel comfortable discharging home when medically stable. Discussed performing PROM to BLE with pt reporting using a LE bicycle that performs PROM in sitting. Reports history of home health therapy and did not feel that it was beneficial. Pt and wife have no further questions or concerns. Pt is at functional mobility baseline with acute PT signing off. Please re-consult if new needs arise.       If plan is discharge home, recommend the following: A lot of help with walking and/or transfers;A lot of help with bathing/dressing/bathroom;Assistance with cooking/housework;Assist for transportation;Help with stairs or ramp for entrance   Can travel by private vehicle    Yes, WC accessible vehicle    Equipment Recommendations None recommended by PT     Functional Status Assessment Patient has had a recent decline in their functional status and demonstrates the ability to make significant improvements in function in a reasonable and predictable amount of time.     Precautions / Restrictions  Precautions Precautions: Fall Precaution/Restrictions Comments: C8 Paraplegia Restrictions Weight Bearing Restrictions Per Provider Order: No      Mobility  Bed Mobility Overal bed mobility: Needs Assistance Bed Mobility: Rolling Rolling: Max assist, Used rails, +2 for physical assistance    General bed mobility comments: MaxAx2 to roll with use of bed pad. Pt able to hold self in sidelying with CGA for safety. TotalAx4 to laterally scoot from bed to air mattress    Transfers  General transfer comment: Deferred, uses hoyer lift at baseline      Balance Overall balance assessment:  (Did not assess)        Pertinent Vitals/Pain Pain Assessment Pain Assessment: No/denies pain    Home Living Family/patient expects to be discharged to:: Private residence Living Arrangements: Spouse/significant other Available Help at Discharge: Family;Available 24 hours/day Type of Home: House Home Access: Ramped entrance    Home Layout: Able to live on main level with bedroom/bathroom Home Equipment: Hospital bed;Wheelchair - power;Hand held shower head;Shower seat      Prior Function Prior Level of Function : Needs assist      Mobility Comments: uses hoyerlift to get to power WC at least once a day. Has recently been only staying in power [facility] for an hour due to sacral wound. Has bicycle for legs that does PROM ADLs Comments: has been doing sponge baths due to sacral wound. Wife has been assisting with wound care     Extremity/Trunk Assessment   Upper Extremity Assessment Upper Extremity Assessment: Defer to OT evaluation    Lower Extremity Assessment Lower Extremity Assessment: RLE deficits/detail;LLE deficits/detail RLE Deficits / Details: No active movement, C8  paraplegia LLE Deficits / Details: No active movement, C8 paraplegia    Cervical / Trunk Assessment Cervical / Trunk Assessment: Normal  Communication   Communication Communication: Impaired Factors Affecting  Communication: Difficulty expressing self (Spanish is primary language, however, able to comprehend and respond to most questions in english)    Cognition Arousal: Alert Behavior During Therapy: WFL for tasks assessed/performed   PT - Cognitive impairments: No apparent impairments  Following commands: Intact       Cueing Cueing Techniques: Verbal cues     General Comments General comments (skin integrity, edema, etc.): Wife present and supportive during session     PT Assessment Patient does not need any further PT services                Co-evaluation   Reason for Co-Treatment: For patient/therapist safety;To address functional/ADL transfers PT goals addressed during session: Mobility/safety with mobility;Strengthening/ROM         AM-PAC PT "6 Clicks" Mobility  Outcome Measure Help needed turning from your back to your side while in a flat bed without using bedrails?: A Lot Help needed moving from lying on your back to sitting on the side of a flat bed without using bedrails?: Total Help needed moving to and from a bed to a chair (including a wheelchair)?: Total Help needed standing up from a chair using your arms (e.g., wheelchair or bedside chair)?: Total Help needed to walk in hospital room?: Total Help needed climbing 3-5 steps with a railing? : Total 6 Click Score: 7    End of Session   Activity Tolerance: Patient tolerated treatment well Patient left: in bed;with call bell/phone within reach;with family/visitor present Nurse Communication: Mobility status;Other (comment) (RN present during beginning of session) PT Visit Diagnosis: Other abnormalities of gait and mobility (R26.89)    Time: 1829-9371 PT Time Calculation (min) (ACUTE ONLY): 27 min   Charges:   PT Evaluation $PT Eval Low Complexity: 1 Low   PT General Charges $$ ACUTE PT VISIT: 1 Visit        Orysia Blas, PT, DPT Secure Chat Preferred  Rehab Office [phone]   Alissa April  Adela Ades 07/13/2023, 2:14 PM

## 2023-07-13 NOTE — Progress Notes (Signed)
  Progress Note   Patient: [name] ZOX:[id number] DOB: March 04, 1965 DOA: 07/11/2023     2 DOS: the patient was seen and examined on 07/13/2023 at 8:50AM      Brief hospital course: 58 y.o. 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      Assessment and Plan: * Severe sepsis with end organ damage due to E coli bacteremia due to UTI and nephrolithiasis due to indwelling catheter (HCC) Due to E coli bactermia due to UTI and nephrolithiasis due to indwelling catheter, present on admission   Presented with leukocytosis, fever, hypotension, SOFA score 2.  Also AKI which was primarily sepsis mediated.   Blood and urine cultures growing E coli - Continue Rocephin , day 3 of 7 - Follow sensitivities  AKI (acute kidney injury) (HCC) Baseline Cr 0.6, here more than quadrupled to 2.4, due mainly to sepsis.  Creatinine improved to baseline.  Was elevated for greater than 48 hours. 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  Vital signs are normalizing, he send no further fever, has had no change to his buttock wound.     Physical Exam: BP 110/80 (BP Location: Right Wrist)   Pulse 63   Temp 99.2 F (37.3 C) (Oral)   Resp 16   Wt 116.1 kg   SpO2 95%   BMI 40.09 kg/m   Adult male, sitting up in bed, interactive and appropriate RRR, no murmurs, no peripheral edema Respiratory rate normal, lungs clear without rales or wheezes Abdomen soft no tenderness palpation or guarding Wound was inspected today, this is small but several centimeters deep, down to the coccyx, the walls of the wound appear clean and healthy, there is no surrounding cellulitis or drainage He is paraplegic, his speech is fluent via interpreter [phone]  Oriented to person, place, and time    Data Reviewed: Basic metabolic panel notable for creatinine down to 0.9, electrolytes normal CBC shows mild anemia, no leukocytosis Glucose elevated  Family Communication: Wife at the bedside    Disposition: Status is: Inpatient         Author: Ephriam Hashimoto, MD 07/13/2023 2:55 PM  For on call review [email or web address].

## 2023-07-13 NOTE — Plan of Care (Signed)

## 2023-07-14 ENCOUNTER — Inpatient Hospital Stay (HOSPITAL_COMMUNITY)

## 2023-07-14 ENCOUNTER — Other Ambulatory Visit: Payer: Self-pay

## 2023-07-14 ENCOUNTER — Other Ambulatory Visit: Payer: Self-pay | Admitting: Physical Medicine and Rehabilitation

## 2023-07-14 DIAGNOSIS — R6520 Severe sepsis without septic shock: Secondary | ICD-10-CM | POA: Diagnosis not present

## 2023-07-14 DIAGNOSIS — A419 Sepsis, unspecified organism: Secondary | ICD-10-CM | POA: Diagnosis not present

## 2023-07-14 LAB — URINE CULTURE
Culture: >=100000 — AB
Culture: >=100000 — AB

## 2023-07-14 LAB — BASIC METABOLIC PANEL WITH GFR
Anion gap: 7 (ref 5–15)
BUN: 15 mg/dL (ref 6–20)
CO2: 27 mmol/L (ref 22–32)
Calcium: 8.1 mg/dL — ABNORMAL LOW (ref 8.9–10.3)
Chloride: 102 mmol/L (ref 98–111)
Creatinine, Ser: 0.69 mg/dL (ref 0.61–1.24)
GFR, Estimated: >60 mL/min (ref >60)
Glucose, Bld: 176 mg/dL — ABNORMAL HIGH (ref 70–99)
Potassium: 4 mmol/L (ref 3.5–5.1)
Sodium: 136 mmol/L (ref 135–145)

## 2023-07-14 LAB — CULTURE, BLOOD (ROUTINE X 2): Special Requests: ADEQUATE

## 2023-07-14 LAB — CBC
HCT: 30.1 % — ABNORMAL LOW (ref 39.0–52.0)
Hemoglobin: 9.6 g/dL — ABNORMAL LOW (ref 13.0–17.0)
MCH: 26 pg (ref 26.0–34.0)
MCHC: 31.9 g/dL (ref 30.0–36.0)
MCV: 81.6 fL (ref 80.0–100.0)
Platelets: 221 10*3/uL (ref 150–400)
RBC: 3.69 MIL/uL — ABNORMAL LOW (ref 4.22–5.81)
RDW: 15.9 % — ABNORMAL HIGH (ref 11.5–15.5)
WBC: 6.4 10*3/uL (ref 4.0–10.5)
nRBC: 0 % (ref 0.0–0.2)

## 2023-07-14 LAB — GLUCOSE, CAPILLARY
Glucose-Capillary: 175 mg/dL — ABNORMAL HIGH (ref 70–99)
Glucose-Capillary: 210 mg/dL — ABNORMAL HIGH (ref 70–99)
Glucose-Capillary: 238 mg/dL — ABNORMAL HIGH (ref 70–99)
Glucose-Capillary: 198 mg/dL — ABNORMAL HIGH (ref 70–99)

## 2023-07-14 MED ORDER — DOCUSATE SODIUM 100 MG PO CAPS
100.0000 mg | ORAL_CAPSULE | Freq: Two times a day (BID) | ORAL | Status: DC
  Administered 2023-07-14 – 2023-07-15 (×2): 100 mg via ORAL
  Filled 2023-07-14 (×2): qty 1

## 2023-07-14 MED ORDER — PNEUMOCOCCAL 20-VAL CONJ VACC 0.5 ML IM SUSY
0.5000 mL | PREFILLED_SYRINGE | INTRAMUSCULAR | Status: AC
  Administered 2023-07-15: 0.5 mL via INTRAMUSCULAR
  Filled 2023-07-14: qty 0.5

## 2023-07-14 MED ORDER — POLYETHYLENE GLYCOL 3350 17 G PO PACK: 17.0000 g | PACK | Freq: Two times a day (BID) | ORAL | Status: DC | PRN

## 2023-07-14 MED ORDER — BISACODYL 10 MG RE SUPP
10.0000 mg | Freq: Once | RECTAL | Status: AC
  Administered 2023-07-15: 10 mg via RECTAL
  Filled 2023-07-14: qty 1

## 2023-07-14 MED ORDER — POLYETHYLENE GLYCOL 3350 17 G PO PACK
17.0000 g | PACK | Freq: Once | ORAL | Status: AC
  Administered 2023-07-14: 17 g via ORAL
  Filled 2023-07-14: qty 1

## 2023-07-14 NOTE — Plan of Care (Signed)

## 2023-07-14 NOTE — Progress Notes (Signed)
  Progress Note   Patient: [name] RUE:[id number] DOB: 1966/01/11 DOA: 07/11/2023     3 DOS: the patient was seen and examined on 07/14/2023 at 11:29AM      Brief hospital course: 58 y.o. 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      Assessment and Plan: * Severe sepsis with end organ damage due to E coli bacteremia due to UTI and nephrolithiasis due to indwelling catheter (HCC) Due to E coli bactermia due to UTI and nephrolithiasis due to indwelling catheter, present on admission   Presented with leukocytosis, fever, hypotension, SOFA score 2.  Also AKI which was primarily sepsis mediated.   Blood and urine cultures growing E coli - Continue Rocephin , day 3 of 7 - Follow sensitivities  AKI (acute kidney injury) (HCC) Baseline Cr 0.6, here more than quadrupled to 2.4, due mainly to sepsis.  Creatinine improved to baseline.  Was elevated for greater than 48 hours. 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     Physical Exam: BP 121/72 (BP Location: Right Arm)   Pulse 78   Temp 99 F (37.2 C) (Oral)   Resp 17   Wt (!) 149.7 kg   SpO2 95%   BMI 51.69 kg/m   Adult male, obese, sitting up in bed, interactive RRR, no murmurs, no peripheral edema Respiratory rate normal, lung sounds diminished due to poor inspiratory effort but no rales or wheezes are appreciated Abdomen soft, no tenderness palpation or guarding Paraplegic, speech fluent via interpreter, oriented to person, place, time, upper extremity strength normal  Data Reviewed: Basic metabolic panel normal CBC with anemia, no leukocytosis  Family Communication: None present    Disposition: Status is: Inpatient         Author: Ephriam Hashimoto, MD 07/14/2023 3:50 PM  For on call review [email or web address].

## 2023-07-14 NOTE — TOC Initial Note (Signed)
 Transition of Care [facility]) - Initial/Assessment Note    Patient Details  Name: [name] MRN: [id number] Date of Birth: November 03, 1965  Transition of Care [facility]) CM/SW Contact:    Cosimo Diones, RN Phone Number: 07/14/2023, 4:30 PM  Clinical Narrative:  Patient presented for sever sepsis. PTA patient was from home with spouse. Patient is active with the wound care center- spouse will continue appointments with the center. Spouse wants HH PT/OT- no agency preference. HH to be arranged with [facility] and start of care to begin within 24-48 hours post discharge. No DME needs identified. Case Manager will continue to follow for additional needs.        Expected Discharge Plan: Home w Home Health Services Barriers to Discharge: No Barriers Identified   Patient Goals and CMS Choice Patient states their goals for this hospitalization and ongoing recovery are:: plan to return home with sposue.   Choice offered to / list presented to : Spouse (No agency preference.)      Expected Discharge Plan and Services In-house Referral: NA Discharge Planning Services: CM Consult Post Acute Care Choice: Home Health Living arrangements for the past 2 months: Single Family Home                   DME Agency: NA       HH Arranged: PT, OT HH Agency: [facility] Date [facility] Agency Contacted: 07/14/23 Time HH Agency Contacted: [phone] Representative spoke with at [facility] Agency: Bartholomew Light  Prior Living Arrangements/Services Living arrangements for the past 2 months: Single Family Home Lives with:: Spouse Patient language and need for interpreter reviewed:: Yes (language line) Do you feel safe going back to the place where you live?: Yes      Need for Family Participation in Patient Care: Yes (Comment) Care giver support system in place?: Yes (comment) Current home services: DME (electric wheelchair.) Criminal Activity/Legal Involvement Pertinent to Current  Situation/Hospitalization: No - Comment as needed  Activities of Daily Living   ADL Screening (condition at time of admission) Independently performs ADLs?: No (Pt is quadrapelgic) Does the patient have a NEW difficulty with bathing/dressing/toileting/self-feeding that is expected to last >3 days?: No Does the patient have a NEW difficulty with getting in/out of bed, walking, or climbing stairs that is expected to last >3 days?: No Does the patient have a NEW difficulty with communication that is expected to last >3 days?: No Is the patient deaf or have difficulty hearing?: No Does the patient have difficulty seeing, even when wearing glasses/contacts?: No Does the patient have difficulty concentrating, remembering, or making decisions?: No  Permission Sought/Granted Permission sought to share information with : Family Supports, Case Production designer, theatre/television/film, Oceanographer granted to share information with : Yes, Verbal Permission Granted     Permission granted to share info w AGENCY: [facility]        Emotional Assessment Appearance:: Appears stated age Attitude/Demeanor/Rapport: Unable to Assess Affect (typically observed): Unable to Assess Orientation: : Oriented to Self, Oriented to Place Alcohol  / Substance Use: Not Applicable Psych Involvement: No (comment)  Admission diagnosis:  Calculus of ureter [N20.1] Pyelonephritis [N12] AKI (acute kidney injury) (HCC) [N17.9] Kidney stone on left side [N20.0] Sacral osteomyelitis (HCC) [M46.28] Septic shock (HCC) [A41.9, R65.21] Patient Active Problem List   Diagnosis Date Noted   AKI (acute kidney injury) (HCC) 07/12/2023   Calculus of ureter 07/12/2023   Morbid obesity (HCC) 07/12/2023   Kidney stone on left side 07/11/2023   UTI (urinary  tract infection) 10/03/2022   Cough 10/03/2022   Anemia of chronic disease 10/03/2022   Paraplegia (HCC) 07/07/2022   Cellulitis, gluteal, left 07/06/2022   Essential  hypertension 02/28/2022   Pressure injury of skin 08/07/2021   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)    Acute cystitis    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   Chronic pain due to trauma 01/01/2021   DM2 (diabetes mellitus, type 2) (HCC) 04/26/2020   History of DVT (deep vein thrombosis) 04/26/2020   UTI due to extended-spectrum beta lactamase (ESBL) producing Escherichia coli 04/26/2020   At risk for unstable body temperature 11/26/2019   Wheelchair dependence 11/26/2019   Severe sepsis with end organ damage due to E coli bacteremia due to UTI and nephrolithiasis due to indwelling catheter (HCC) 09/22/2019   Hypokalemia 09/22/2019   Spasticity 03/08/2019   Erectile dysfunction due to diseases classified elsewhere 03/08/2019   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/22/2019   Neurogenic bowel 01/22/2019   Neurogenic bladder 01/22/2019   Myofascial pain 01/22/2019   Spinal cord injury at T1-T6 level (HCC) 12/30/2018   PCP:  Jonell Neptune, FNP Pharmacy:   [facility] - [place], [place] - [address] [place] [address] Phone: [phone] Fax: [phone]     Social Drivers of Health (SDOH) Social History: SDOH Screenings   Food Insecurity: No Food Insecurity (07/11/2023)  Housing: Low Risk  (07/11/2023)  Transportation Needs: No Transportation Needs (07/11/2023)  Utilities: Not At Risk (07/11/2023)  Depression (PHQ2-9): Low Risk  (03/26/2023)  Financial Resource Strain: At Risk (01/02/2023)   Received from [facility]  Physical Activity: At Risk (01/02/2023)   Received from [facility]  Social Connections: Not at Risk (01/02/2023)   Received from [facility]  Stress: Not at Risk (01/02/2023)   Received from [facility]  Tobacco Use: Low Risk  (07/11/2023)   SDOH Interventions:     Readmission Risk Interventions     No data to display

## 2023-07-15 ENCOUNTER — Other Ambulatory Visit (HOSPITAL_COMMUNITY): Payer: Self-pay

## 2023-07-15 DIAGNOSIS — R6520 Severe sepsis without septic shock: Secondary | ICD-10-CM | POA: Diagnosis not present

## 2023-07-15 DIAGNOSIS — A419 Sepsis, unspecified organism: Secondary | ICD-10-CM | POA: Diagnosis not present

## 2023-07-15 LAB — GLUCOSE, CAPILLARY: Glucose-Capillary: 183 mg/dL — ABNORMAL HIGH (ref 70–99)

## 2023-07-15 MED ORDER — SULFAMETHOXAZOLE-TRIMETHOPRIM 800-160 MG PO TABS
1.0000 | ORAL_TABLET | Freq: Two times a day (BID) | ORAL | 0 refills | Status: DC
Start: 2023-07-15 — End: 2023-07-31
  Filled 2023-07-15: qty 14, 7d supply, fill #0

## 2023-07-15 MED ORDER — BISACODYL 10 MG RE SUPP: 10.0000 mg | Freq: Every day | RECTAL | Status: DC | PRN

## 2023-07-15 NOTE — Care Management Important Message (Signed)
 Important Message  Patient Details  Name: [name] MRN: [id number] Date of Birth: 09-15-1965   Important Message Given:  Yes - Medicare IM     Janith Melnick 07/15/2023, 10:37 AM

## 2023-07-15 NOTE — Plan of Care (Signed)

## 2023-07-15 NOTE — Discharge Summary (Signed)
 Physician Discharge Summary   Patient: [name] MRN: [id number] DOB: 13-Oct-1965  Admit date:     07/11/2023  Discharge date: 07/15/23  Discharge Physician: Ephriam Hashimoto   PCP: Jonell Neptune, FNP     Recommendations at discharge:  Follow up with PCP Carita Charm for UTI Follow up with Urology Dr. Valeta Gaudier for nephrolith with stent placement Follow up with W[facility] for sacral wound Carita Charm: Please check CBC and BMP in 1 week (discharge creatinine 0.7, sodium 136, WBC 6.4, hemoglobin 9.6) Check blood pressure, kidney function and resume lisinopril  if appropriate      Discharge Diagnoses: Principal Problem:   Severe sepsis with end organ damage due to E coli bacteremia due to UTI and nephrolithiasis due to indwelling catheter (HCC) Active Problems:   AKI (acute kidney injury) (HCC)   Essential hypertension   DM2 (diabetes mellitus, type 2) (HCC)   History of DVT (deep vein thrombosis)   Paraplegia (HCC)   Anemia of chronic disease   Kidney stone on left side   Chronic sacral osteomyelitis (HCC)   Calculus of ureter   Morbid obesity [facility])      Hospital Course: 58 y.o. 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       * Severe sepsis with end organ damage due to E coli bacteremia due to UTI and nephrolithiasis due to indwelling catheter (HCC) Due to E coli bactermia due to UTI and nephrolithiasis due to indwelling catheter, present on admission   Presented with leukocytosis, fever, hypotension, SOFA score 2.  Also AKI which was primarily sepsis mediated.   Blood and urine cultures grew E coli sensitive to Rocephin  and Bactrim.  Treated with 4 days of Rocephin  in the hospital, discharged to complete 6 more days with  Bactrim.  He defervesced well, was afebrile, vital signs normal.  Follow-up for stent removal is pending.    AKI (acute kidney injury) (HCC) Creatinine 2.4 on admission, resolved to baseline with fluids.    Essential hypertension Lisinopril  held at discharge due to normal/soft blood pressure and recent AKI.   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  There is no evidence of infection of his wound.  Anemia of chronic disease No bleeding observed  Paraplegia (HCC) Spasms On baclofen   History of DVT (deep vein thrombosis) Stable on Eliquis   DM2 (diabetes mellitus, type 2) (HCC) On glipizide , glargine            The [place]  Controlled Substances Registry was reviewed for this patient prior to discharge.  Consultants: Urology Procedures performed: Ureteral stent placement Disposition: Home health Diet recommendation:  Cardiac and Carb modified diet  DISCHARGE MEDICATION: Allergies as of 07/15/2023   No Known Allergies      Medication List     PAUSE taking these medications    lisinopril  5 MG tablet Wait to take this until your doctor or other care provider tells you to start again. Commonly known as: ZESTRIL  Take 5 mg by mouth daily.       TAKE these  medications    acetaminophen  500 MG tablet Commonly known as: TYLENOL  Take 500 mg by mouth every 6 (six) hours as needed for mild pain.   ARTIFICIAL TEARS OP Apply 1-2 drops to eye daily as needed (dry eyes).   BISACODYL  LAXATIVE RE Place 1 suppository rectally at bedtime.   docusate sodium  100 MG capsule Commonly known as: COLACE Take 1 capsule (100 mg total) by mouth 2 (two) times daily for 5 days, then as directed by  physician What changed: when to take this   Eliquis  5 MG Tabs tablet Generic drug: apixaban  Take 1 tablet by mouth 2 (two) times daily.   FLUoxetine  20 MG capsule Commonly known as: PROZAC  Take 1 capsule (20 mg total) by mouth daily.   furosemide  40 MG tablet Commonly known as: LASIX  Take 1 tablet daily for 6 days, take as needed after that for swelling or weight gain   gabapentin  600 MG tablet Commonly known as: NEURONTIN  Take 1 tablet (600 mg total) by mouth 3 (three) times daily.   glipiZIDE  10 MG tablet Commonly known as: GLUCOTROL  Take 1 tablet by mouth 2 (two) times daily.   HYDROcodone -acetaminophen  5-325 MG tablet Commonly known as: Norco Take 1 tablet by mouth every 6 (six) hours as needed for moderate pain (pain score 4-6). What changed: when to take this   Lantus  100 UNIT/ML injection Generic drug: insulin  glargine Inject 30 Units into the skin at bedtime.   ondansetron  4 MG tablet Commonly known as: ZOFRAN  Take 1 tablet (4 mg total) by mouth every 6 (six) hours as needed for nausea.   sildenafil  100 MG tablet Commonly known as: VIAGRA  Take 100 mg by mouth as needed for erectile dysfunction.   sulfamethoxazole-trimethoprim 800-160 MG tablet Commonly known as: BACTRIM DS Take 1 tablet by mouth 2 (two) times daily.       ASK your doctor about these medications    baclofen  10 MG tablet Commonly known as: LIORESAL  TAKE 1 TABLET BY MOUTH 3 TIMES DAILY AS NEEDED FOR MUSCLE SPASMS OR tightness Ask about: Which instructions should I use?   baclofen  20 MG tablet Commonly known as: LIORESAL  TAKE 1 TABLET BY MOUTH 3 TIMES DAILY Ask about: Which instructions should I use?               Discharge Care Instructions  (From admission, onward)           Start     Ordered   07/15/23 0000  Discharge wound care:       Comments: Continue previous wound care without change to plan   07/15/23 0943            Follow-up Information     Care,  [facility] Follow up.   Why: Home Health Physical and Occupational Therapy-office to call withv visit times. Contact information: [address] [place] [address] [phone]         Jonell Neptune, FNP. Schedule an appointment as soon as possible for a visit in 1 week(s).   Specialty: Nurse Practitioner Contact information: [address] [place] [address] [phone]         Celia Coles, MD Follow up.   Specialty: Physical Medicine and Rehabilitation Contact information: [address] [place] [address] [phone]         Theron Flavin, DO Follow up.   Specialty: Internal Medicine Contact information: [address] [place] [address] [place] [address] [phone]  Perley Bradley D, MD. Schedule an appointment as soon as possible for a visit in 2 week(s).   Specialty: Urology Contact information: [address] [place] [address] [place] [address] [phone]                 Discharge Instructions     Discharge instructions   Complete by: As directed    You were admitted for UTI and a kidney stone  You had a stent placed  Call Dr. Valeta Gaudier (the Urologist) at her office to arrange the stent to be removed (see below in the To Do section)  To finish treatment, take Bactrim DS twice daily for 7 more days  Go see your primary doctor in 1 week  HOLD (do not take) your lisinopril  for now  Ask your primary doctor if you should resume this in 1 week  Resume all your other home medicines without change  Keep all follow ups with Drs. Lovorn and Hoffman   Discharge wound care:   Complete by: As directed    Continue previous wound care without change to plan   Increase activity slowly   Complete by: As directed        Discharge Exam: Filed Weights   07/12/23 0337 07/13/23 0519 07/14/23 0407  Weight: 113.7 kg 116.1 kg (!) 149.7 kg    General: Pt is alert, awake, not in acute  distress, sitting up in bed, paraplegic Cardiovascular: RRR, nl S1-S2, no murmurs appreciated.   No LE edema.   Respiratory: Normal respiratory rate and rhythm.  CTAB without rales or wheezes. Abdominal: Abdomen soft and non-tender.  No distension or HSM.   Neuro/Psych: Thenar wasting of the hands, strength in the upper extremity seems normal and symmetric, paraplegic otherwise.  Judgment and insight appear normal.  All history collected through video phonic interpreter   Condition at discharge: good  The results of significant diagnostics from this hospitalization (including imaging, microbiology, ancillary and laboratory) are listed below for reference.   Imaging Studies: DG CHEST PORT 1 VIEW Result Date: 07/14/2023 CLINICAL DATA:  Aspiration into airway. EXAM: PORTABLE CHEST 1 VIEW COMPARISON:  07/11/2023 FINDINGS: Stable heart size and mediastinal contours. No acute airspace disease. No pulmonary edema, pleural effusion or pneumothorax. Cervical and thoracic spinal fusion is partially included in the field of view. IMPRESSION: No acute findings or radiographic evidence of aspiration. Electronically Signed   By: Chadwick Colonel M.D.   On: 07/14/2023 17:11   DG C-Arm 1-60 Min-No Report Result Date: 07/11/2023 Fluoroscopy was utilized by the requesting physician.  No radiographic interpretation.   CT ABDOMEN PELVIS WO CONTRAST Result Date: 07/11/2023 CLINICAL DATA:  Sepsis.  Neck pain. EXAM: CT ABDOMEN AND PELVIS WITHOUT CONTRAST TECHNIQUE: Multidetector CT imaging of the abdomen and pelvis was performed following the standard protocol without IV contrast. RADIATION DOSE REDUCTION: This exam was performed according to the departmental dose-optimization program which includes automated exposure control, adjustment of the mA and/or kV according to patient size and/or use of iterative reconstruction technique. COMPARISON:  10/02/2022 FINDINGS: Lower chest: Linear infiltration in the lung bases,  greater on the left. Peribronchial thickening may represent acute or chronic bronchitis. Hepatobiliary: Cholelithiasis. No inflammatory changes. No bile duct dilatation. No focal liver lesions identified. Pancreas: Unremarkable. No pancreatic ductal dilatation or surrounding inflammatory changes. Spleen: Normal in size without focal abnormality. Adrenals/Urinary Tract: No adrenal gland nodules. 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 Has there been  a history of interval lithotripsy? 4 mm stone in the distal left ureter about 1 cm above the ureterovesical junction. There is proximal hydronephrosis and hydroureter. Prominent stranding around the left ureter and left kidney. Left kidney is enlarged compared with the right. These are all likely obstructive changes but the stranding could also indicate superimposed infection or may be related to recent procedure if any. There is also gas in the renal collecting system and left ureter. This could result from instrumentation or may indicate infection with gas-forming organism. A Foley catheter decompresses the bladder. The right kidney and ureter are normal. Stomach/Bowel: Stomach, small bowel, and colon are not abnormally distended. Colon is diffusely stool-filled. No wall thickening is identified. Mild stranding in the left pericolic gutter is likely related to the renal process. The appendix is normal. Vascular/Lymphatic: Calcification of the aorta. No aneurysm. Inferior vena caval filter is present. Reproductive: Prostate gland is either atrophic or surgically absent. Other: No free air or free fluid in the abdomen. Abdominal wall musculature appears intact. Fatty atrophy of the hip musculature. Musculoskeletal: Degenerative changes in the spine. Skin defect with gas and debris filled cavity over the lower sacral coccygeal region and extending from bone disc in surface. There is some bone erosion.  This suggest decubitus ulceration with osteomyelitis. There is mild progression in the size of the cavity since the previous study. IMPRESSION: 1. Interval loss of prior staghorn calculus with multiple small stones in the left kidney, possibly due to interval lithotripsy. Correlate with clinical history. 2. 4 mm stone in the distal left ureter with moderate proximal obstruction. 3. Stranding around the left kidney and ureter with gas in the left intrarenal collecting system and left ureter, possibly due to instrumentation, obstruction, and/or infection. Correlate with urinalysis. 4. Foley catheter decompresses the bladder. 5. Deep decubitus ulceration with bone loss over the sacral coccygeal region suggesting underlying osteomyelitis. 6. Cholelithiasis without evidence of acute cholecystitis. Electronically Signed   By: Boyce Byes M.D.   On: 07/11/2023 18:37   DG Chest Portable 1 View Result Date: 07/11/2023 CLINICAL DATA:  Cough. EXAM: PORTABLE CHEST 1 VIEW COMPARISON:  10/02/2022 FINDINGS: The cardiomediastinal contours are normal. The lungs are clear. Pulmonary vasculature is normal. No consolidation, pleural effusion, or pneumothorax. Thoracolumbar fusion hardware. No acute osseous abnormalities are seen. IMPRESSION: No acute chest findings. Electronically Signed   By: Chadwick Colonel M.D.   On: 07/11/2023 17:55    Microbiology: Results for orders placed or performed during the hospital encounter of 07/11/23  Urine Culture     Status: Abnormal   Collection Time: 07/11/23  1:13 PM   Specimen: Urine, Random  Result Value Ref Range Status   Specimen Description URINE, RANDOM  Final   Special Requests   Final    NONE Reflexed from [id number] Performed at [facility], [address], [place], [place] [address]    Culture >=100,000 COLONIES/mL ESCHERICHIA COLI (A)  Final   Report Status 07/14/2023 FINAL  Final   Organism ID, Bacteria ESCHERICHIA COLI (A)  Final      Susceptibility    Escherichia coli - MIC*    AMPICILLIN  16 INTERMEDIATE Intermediate     CEFAZOLIN  <=4 SENSITIVE Sensitive     CEFEPIME  <=0.12 SENSITIVE Sensitive     CEFTRIAXONE  <=0.25 SENSITIVE Sensitive     CIPROFLOXACIN >=4 RESISTANT Resistant     GENTAMICIN <=1 SENSITIVE Sensitive     IMIPENEM <=0.25 SENSITIVE Sensitive     NITROFURANTOIN  64 INTERMEDIATE Intermediate  TRIMETH/SULFA <=20 SENSITIVE Sensitive     AMPICILLIN /SULBACTAM 8 SENSITIVE Sensitive     PIP/TAZO 8 SENSITIVE Sensitive ug/mL    * >=100,000 COLONIES/mL ESCHERICHIA COLI  Culture, blood (routine x 2)     Status: Abnormal   Collection Time: 07/11/23  3:55 PM   Specimen: BLOOD RIGHT WRIST  Result Value Ref Range Status   Specimen Description BLOOD RIGHT WRIST  Final   Special Requests   Final    BOTTLES DRAWN AEROBIC AND ANAEROBIC Blood Culture results may not be optimal due to an inadequate volume of blood received in culture bottles   Culture  Setup Time   Final    GRAM NEGATIVE RODS IN BOTH AEROBIC AND ANAEROBIC BOTTLES CRITICAL VALUE NOTED.  VALUE IS CONSISTENT WITH PREVIOUSLY REPORTED AND CALLED VALUE.    Culture (A)  Final    ESCHERICHIA COLI SUSCEPTIBILITIES PERFORMED ON PREVIOUS CULTURE WITHIN THE LAST 5 DAYS. Performed at [facility], [address] [place], [place] [address]    Report Status 07/14/2023 FINAL  Final  Culture, blood (routine x 2)     Status: Abnormal   Collection Time: 07/11/23  4:04 PM   Specimen: BLOOD  Result Value Ref Range Status   Specimen Description BLOOD RIGHT ANTECUBITAL  Final   Special Requests   Final    BOTTLES DRAWN AEROBIC AND ANAEROBIC Blood Culture adequate volume   Culture  Setup Time   Final    GRAM NEGATIVE RODS IN BOTH AEROBIC AND ANAEROBIC BOTTLES CRITICAL RESULT CALLED TO, READ BACK BY AND VERIFIED WITH: PHARMD G ABBOTT 07/12/2023 @ 0543 BY AB Performed at [facility], [address] [place], [place] [address]    Culture ESCHERICHIA COLI (A)  Final    Report Status 07/14/2023 FINAL  Final   Organism ID, Bacteria ESCHERICHIA COLI  Final   Organism ID, Bacteria ESCHERICHIA COLI  Final      Susceptibility   Escherichia coli - KIRBY BAUER*    CEFAZOLIN  SENSITIVE Sensitive    Escherichia coli - MIC*    AMPICILLIN  16 INTERMEDIATE Intermediate     CEFEPIME  <=0.12 SENSITIVE Sensitive     CEFTAZIDIME <=1 SENSITIVE Sensitive     CEFTRIAXONE  <=0.25 SENSITIVE Sensitive     CIPROFLOXACIN >=4 RESISTANT Resistant     GENTAMICIN <=1 SENSITIVE Sensitive     IMIPENEM <=0.25 SENSITIVE Sensitive     TRIMETH/SULFA <=20 SENSITIVE Sensitive     AMPICILLIN /SULBACTAM 8 SENSITIVE Sensitive     PIP/TAZO 8 SENSITIVE Sensitive ug/mL    * ESCHERICHIA COLI    ESCHERICHIA COLI  Blood Culture ID Panel (Reflexed)     Status: Abnormal   Collection Time: 07/11/23  4:04 PM  Result Value Ref Range Status   Enterococcus faecalis NOT DETECTED NOT DETECTED Final   Enterococcus Faecium NOT DETECTED NOT DETECTED Final   Listeria monocytogenes NOT DETECTED NOT DETECTED Final   Staphylococcus species NOT DETECTED NOT DETECTED Final   Staphylococcus aureus (BCID) NOT DETECTED NOT DETECTED Final   Staphylococcus epidermidis NOT DETECTED NOT DETECTED Final   Staphylococcus lugdunensis NOT DETECTED NOT DETECTED Final   Streptococcus species NOT DETECTED NOT DETECTED Final   Streptococcus agalactiae NOT DETECTED NOT DETECTED Final   Streptococcus pneumoniae NOT DETECTED NOT DETECTED Final   Streptococcus pyogenes NOT DETECTED NOT DETECTED Final   A.calcoaceticus-baumannii NOT DETECTED NOT DETECTED Final   Bacteroides fragilis NOT DETECTED NOT DETECTED Final   Enterobacterales DETECTED (A) NOT DETECTED Final    Comment: Enterobacterales  represent a large order of gram negative bacteria, not a single organism. CRITICAL RESULT CALLED TO, READ BACK BY AND VERIFIED WITH: PHARMD G ABBOTT 07/12/2023 @ 0543 BY AB    Enterobacter cloacae complex NOT DETECTED NOT DETECTED Final    Escherichia coli DETECTED (A) NOT DETECTED Final    Comment: CRITICAL RESULT CALLED TO, READ BACK BY AND VERIFIED WITH: PHARMD G ABBOTT 07/12/2023 @ 0543 BY AB    Klebsiella aerogenes NOT DETECTED NOT DETECTED Final   Klebsiella oxytoca NOT DETECTED NOT DETECTED Final   Klebsiella pneumoniae NOT DETECTED NOT DETECTED Final   Proteus species NOT DETECTED NOT DETECTED Final   Salmonella species NOT DETECTED NOT DETECTED Final   Serratia marcescens NOT DETECTED NOT DETECTED Final   Haemophilus influenzae NOT DETECTED NOT DETECTED Final   Neisseria meningitidis NOT DETECTED NOT DETECTED Final   Pseudomonas aeruginosa NOT DETECTED NOT DETECTED Final   Stenotrophomonas maltophilia NOT DETECTED NOT DETECTED Final   Candida albicans NOT DETECTED NOT DETECTED Final   Candida auris NOT DETECTED NOT DETECTED Final   Candida glabrata NOT DETECTED NOT DETECTED Final   Candida krusei NOT DETECTED NOT DETECTED Final   Candida parapsilosis NOT DETECTED NOT DETECTED Final   Candida tropicalis NOT DETECTED NOT DETECTED Final   Cryptococcus neoformans/gattii NOT DETECTED NOT DETECTED Final   CTX-M ESBL NOT DETECTED NOT DETECTED Final   Carbapenem resistance IMP NOT DETECTED NOT DETECTED Final   Carbapenem resistance KPC NOT DETECTED NOT DETECTED Final   Carbapenem resistance NDM NOT DETECTED NOT DETECTED Final   Carbapenem resist OXA 48 LIKE NOT DETECTED NOT DETECTED Final   Carbapenem resistance VIM NOT DETECTED NOT DETECTED Final    Comment: Performed at [facility], [address] [place], [place] [address]  Urine Culture     Status: Abnormal   Collection Time: 07/11/23  8:52 PM   Specimen: Urine, Cystoscope  Result Value Ref Range Status   Specimen Description CYSTOSCOPY  Final   Special Requests   Final    NONE Performed at [facility], [address], [place], [place] [address]    Culture >=100,000 COLONIES/mL ESCHERICHIA COLI (A)  Final   Report Status 07/14/2023 FINAL  Final    Organism ID, Bacteria ESCHERICHIA COLI (A)  Final      Susceptibility   Escherichia coli - MIC*    AMPICILLIN  16 INTERMEDIATE Intermediate     CEFEPIME  <=0.12 SENSITIVE Sensitive     CEFTAZIDIME <=1 SENSITIVE Sensitive     CEFTRIAXONE  <=0.25 SENSITIVE Sensitive     CIPROFLOXACIN >=4 RESISTANT Resistant     GENTAMICIN <=1 SENSITIVE Sensitive     IMIPENEM <=0.25 SENSITIVE Sensitive     TRIMETH/SULFA <=20 SENSITIVE Sensitive     AMPICILLIN /SULBACTAM 8 SENSITIVE Sensitive     PIP/TAZO 8 SENSITIVE Sensitive ug/mL    * >=100,000 COLONIES/mL ESCHERICHIA COLI    Labs: CBC: Recent Labs  Lab 07/11/23 1313 07/12/23 0455 07/13/23 0453 07/14/23 0525  WBC 19.0* 15.4* 9.5 6.4  NEUTROABS 16.5* 14.1*  --   --   HGB 10.2* 9.8* 9.5* 9.6*  HCT 32.2* 31.4* 30.3* 30.1*  MCV 83.0 82.2 82.1 81.6  PLT 239 196 238 221   Basic Metabolic Panel: Recent Labs  Lab 07/11/23 1313 07/11/23 2243 07/12/23 0455 07/13/23 0453 07/14/23 0525  NA 133*  --  135 136 136  K 4.1  --  4.4 4.3 4.0  CL 102  --  106 105 102  CO2 20*  --  21* 23 27  GLUCOSE 239*  --  300* 325* 176*  BUN 32*  --  24* 20 15  CREATININE 2.40*  --  1.32* 0.91 0.69  CALCIUM 7.9*  --  7.6* 8.2* 8.1*  MG  --  1.4* 1.6* 1.8  --    Liver Function Tests: Recent Labs  Lab 07/11/23 1313 07/12/23 0455 07/13/23 0453  AST 14* 14* 11*  ALT 11 11 12   ALKPHOS 71 56 60  BILITOT 0.9 0.7 0.4  PROT 6.8 6.5 6.7  ALBUMIN  2.8* 2.7* 2.5*   CBG: Recent Labs  Lab 07/14/23 0732 07/14/23 1145 07/14/23 1606 07/14/23 2131 07/15/23 0728  GLUCAP 175* 210* 238* 198* 183*    Discharge time spent: approximately 45 minutes spent on discharge counseling, evaluation of patient on day of discharge, and coordination of discharge planning with nursing, social work, pharmacy and case management  Signed: Ephriam Hashimoto, MD [facility] 07/15/2023

## 2023-07-15 NOTE — Progress Notes (Signed)
 Reviewed AVS using Interpretor Catherine Cloud [phone]), patient expressed understanding of medications, MD follow up reviewed.   Removed IV, Site clean, dry and intact.  Patient states all belongings brought to the hospital at time of admission are accounted for and packed to take home.  Picked up medications from [facility] pharmacy. Pt transported to entrance A where family member was waiting in vehicle to transport home.

## 2023-07-15 NOTE — Care Management Important Message (Signed)
 Important Message  Patient Details  Name: [name] MRN: [id number] Date of Birth: [birth year]   Important Message Given:  Yes - Medicare IM     Janith Melnick 07/15/2023, 1:06 PM

## 2023-07-15 NOTE — Progress Notes (Signed)
 [facility] night cross cover note:   I was notified by the patient's RN of the patient's constipation, as well as wife's report that she typically administers a suppository on a daily basis as an outpatient.  I subsequently added Colace 100 mg p.o. twice daily, first dose now.  MiraLAX  17 g p.o. x 1 dose now followed by MiraLAX  17 g p.o. twice daily as needed for constipation.  I also ordered Dulcolax suppository x 1 now followed by daily prn Dulcolax suppository.     Camelia Cavalier, DO Hospitalist

## 2023-07-22 ENCOUNTER — Other Ambulatory Visit: Payer: Self-pay | Admitting: Urology

## 2023-07-23 ENCOUNTER — Other Ambulatory Visit: Payer: Self-pay | Admitting: Urology

## 2023-07-23 NOTE — Patient Instructions (Addendum)
 SURGICAL WAITING ROOM VISITATION Patients having surgery or a procedure may have no more than 2 support people in the waiting area - these visitors may rotate.    Children under the age of 47 must have an adult with them who is not the patient.  If the patient needs to stay at the hospital during part of their recovery, the visitor guidelines for inpatient rooms apply. Pre-op nurse will coordinate an appropriate time for 1 support person to accompany patient in pre-op.  This support person may not rotate.    Please refer to the [facility] website for the visitor guidelines for Inpatients (after your surgery is over and you are in a regular room).       Your procedure is scheduled on: 07-31-23   Report to [facility] Main Entrance    Report to admitting at 8:00 AM   Call this number if you have problems the morning of surgery [phone]   Do not eat food or drink liquids :After Midnight.          If you have questions, please contact your surgeon's office.   FOLLOW ANY ADDITIONAL PRE OP INSTRUCTIONS YOU RECEIVED FROM YOUR SURGEON'S OFFICE!!!     Oral Hygiene is also important to reduce your risk of infection.                                    Remember - BRUSH YOUR TEETH THE MORNING OF SURGERY WITH YOUR REGULAR TOOTHPASTE   Do NOT smoke after Midnight   Take these medicines the morning of surgery with A SIP OF WATER:    Fluoxetine    Gabapentin    If needed Tylenol , Hydrocodone , Ondansetron    Hold Eliquis  2 days before surgery   Stop all vitamins and herbal supplements 7 days before surgery  How to Manage Your Diabetes Before and After Surgery  Why is it important to control my blood sugar before and after surgery? Improving blood sugar levels before and after surgery helps healing and can limit problems. A way of improving blood sugar control is eating a healthy diet by:  Eating less sugar and carbohydrates  Increasing activity/exercise  Talking with your  doctor about reaching your blood sugar goals High blood sugars (greater than 180 mg/dL) can raise your risk of infections and slow your recovery, so you will need to focus on controlling your diabetes during the weeks before surgery. Make sure that the doctor who takes care of your diabetes knows about your planned surgery including the date and location.  How do I manage my blood sugar before surgery? Check your blood sugar at least 4 times a day, starting 2 days before surgery, to make sure that the level is not too high or low. Check your blood sugar the morning of your surgery when you wake up and every 2 hours until you get to the Short Stay unit. If your blood sugar is less than 70 mg/dL, you will need to treat for low blood sugar: Do not take insulin . Treat a low blood sugar (less than 70 mg/dL) with  cup of clear juice (cranberry or apple), 4 glucose tablets, OR glucose gel. Recheck blood sugar in 15 minutes after treatment (to make sure it is greater than 70 mg/dL). If your blood sugar is not greater than 70 mg/dL on recheck, call [phone] for further instructions. Report your blood sugar to the short stay nurse  when you get to Short Stay.  If you are admitted to the hospital after surgery: Your blood sugar will be checked by the staff and you will probably be given insulin  after surgery (instead of oral diabetes medicines) to make sure you have good blood sugar levels. The goal for blood sugar control after surgery is 80-180 mg/dL.   WHAT DO I DO ABOUT MY DIABETES MEDICATION?  Do not take oral diabetes medicines (pills) the morning of surgery (do not take Glipidzide)        Do not take evening dose of Glipizide  the night before surgery   THE NIGHT BEFORE SURGERY, take 15 units of Lantus   insulin .      DO NOT TAKE THE FOLLOWING 7 DAYS PRIOR TO SURGERY: Ozempic, Wegovy, Rybelsus (Semaglutide), Byetta (exenatide), Bydureon (exenatide ER), Victoza, Saxenda (liraglutide), or  Trulicity (dulaglutide) Mounjaro (Tirzepatide) Adlyxin (Lixisenatide), Polyethylene Glycol Loxenatide.  Reviewed and Endorsed by [facility] Patient Education Committee, August 2015                              You may not have any metal on your body including  jewelry, and body piercing             Do not wear  lotions, powders,  cologne, or deodorant              Men may shave face and neck.   Do not bring valuables to the hospital. [facility] IS NOT RESPONSIBLE   FOR VALUABLES.   Contacts, dentures or bridgework may not be worn into surgery.  DO NOT BRING YOUR HOME MEDICATIONS TO THE HOSPITAL. PHARMACY WILL DISPENSE MEDICATIONS LISTED ON YOUR MEDICATION LIST TO YOU DURING YOUR ADMISSION IN THE HOSPITAL!    Patients discharged on the day of surgery will not be allowed to drive home.  Someone NEEDS to stay with you for the first 24 hours after anesthesia.              Please read over the following fact sheets you were given: IF YOU HAVE QUESTIONS ABOUT YOUR PRE-OP INSTRUCTIONS PLEASE CALL [phone] [facility]  If you received a COVID test during your pre-op visit  it is requested that you wear a mask when out in public, stay away from anyone that may not be feeling well and notify your surgeon if you develop symptoms. If you test positive for Covid or have been in contact with anyone that has tested positive in the last 10 days please notify you surgeon.  [place] - Preparing for Surgery Before surgery, you can play an important role.  Because skin is not sterile, your skin needs to be as free of germs as possible.  You can reduce the number of germs on your skin by washing with CHG (chlorahexidine gluconate) soap before surgery.  CHG is an antiseptic cleaner which kills germs and bonds with the skin to continue killing germs even after washing. Please DO NOT use if you have an allergy to CHG or antibacterial soaps.  If your skin becomes reddened/irritated stop using the CHG and inform  your nurse when you arrive at Short Stay. Do not shave (including legs and underarms) for at least 48 hours prior to the first CHG shower.  You may shave your face/neck.  Please follow these instructions carefully:  1.  Shower with CHG Soap the night before surgery and the  morning of surgery.  2.  If you choose to wash your hair, wash your hair first as usual with your normal  shampoo.  3.  After you shampoo, rinse your hair and body thoroughly to remove the shampoo.                             4.  Use CHG as you would any other liquid soap.  You can apply chg directly to the skin and wash.  Gently with a scrungie or clean washcloth.  5.  Apply the CHG Soap to your body ONLY FROM THE NECK DOWN.   Do   not use on face/ open                           Wound or open sores. Avoid contact with eyes, ears mouth and   genitals (private parts).                       Wash face,  Genitals (private parts) with your normal soap.             6.  Wash thoroughly, paying special attention to the area where your    surgery  will be performed.  7.  Thoroughly rinse your body with warm water from the neck down.  8.  DO NOT shower/wash with your normal soap after using and rinsing off the CHG Soap.                9.  Pat yourself dry with a clean towel.            10.  Wear clean pajamas.            11.  Place clean sheets on your bed the night of your first shower and do not  sleep with pets. Day of Surgery : Do not apply any lotions/deodorants the morning of surgery.  Please wear clean clothes to the hospital/surgery center.  FAILURE TO FOLLOW THESE INSTRUCTIONS MAY RESULT IN THE CANCELLATION OF YOUR SURGERY  PATIENT SIGNATURE_________________________________  NURSE SIGNATURE__________________________________  ________________________________________________________________________

## 2023-07-23 NOTE — Progress Notes (Addendum)
 COVID Vaccine Completed:  Yes  Date of COVID positive in last 90 days:  PCP - Carita Charm, FNP  Cardiologist - N/A  Chest x-ray - 07-14-23 Epic EKG - 10-03-22 Epic Stress Test -  N/A ECHO -  N/A Cardiac Cath -  N/A Pacemaker/ICD device last checked: Spinal Cord Stimulator: N/A  Bowel Prep -  N/A  Sleep Study -  N/A CPAP -   Fasting Blood Sugar - 150 range  Checks Blood Sugar - checks occasionally   Last dose of GLP1 agonist-  N/A GLP1 instructions:  Hold 7 days before surgery    Last dose of SGLT-2 inhibitors-  N/A SGLT-2 instructions:  Hold 3 days before surgery   Blood Thinner Instructions:  Eliquis  per wife last dose 8 pm on 07-28-23.  Knows to hold until after surgery Aspirin Instructions: Last Dose:  Activity level:  Patient is a paraplegic.     Anesthesia review: Paraplegic, HTN, DM, occasionally states that he feels short of breath (this is not a new issue per wife).   Per wife patient is difficult to intubate.  STOP BANG 5  Patient denies shortness of breath, fever, cough and chest pain at PAT appointment (completed over the phone)  Patient verbalized understanding of instructions that were given to them at the PAT appointment. Patient was also instructed that they will need to review over the PAT instructions again at home before surgery.

## 2023-07-29 ENCOUNTER — Encounter (HOSPITAL_COMMUNITY)
Admission: RE | Admit: 2023-07-29 | Discharge: 2023-07-29 | Disposition: A | Discharge status: Home / Self Care | Source: Ambulatory Visit | Attending: Urology | Admitting: Urology

## 2023-07-29 ENCOUNTER — Encounter (HOSPITAL_COMMUNITY): Payer: Self-pay

## 2023-07-29 ENCOUNTER — Encounter (HOSPITAL_BASED_OUTPATIENT_CLINIC_OR_DEPARTMENT_OTHER): Attending: Internal Medicine | Admitting: Internal Medicine

## 2023-07-29 ENCOUNTER — Other Ambulatory Visit: Payer: Self-pay

## 2023-07-29 DIAGNOSIS — M4628 Osteomyelitis of vertebra, sacral and sacrococcygeal region: Secondary | ICD-10-CM | POA: Diagnosis not present

## 2023-07-29 DIAGNOSIS — E11622 Type 2 diabetes mellitus with other skin ulcer: Secondary | ICD-10-CM | POA: Diagnosis not present

## 2023-07-29 DIAGNOSIS — E119 Type 2 diabetes mellitus without complications: Secondary | ICD-10-CM

## 2023-07-29 DIAGNOSIS — L89154 Pressure ulcer of sacral region, stage 4: Secondary | ICD-10-CM | POA: Diagnosis present

## 2023-07-29 DIAGNOSIS — G8221 Paraplegia, complete: Secondary | ICD-10-CM | POA: Insufficient documentation

## 2023-07-29 HISTORY — DX: Depression, unspecified: F32.A

## 2023-07-29 HISTORY — DX: Gastro-esophageal reflux disease without esophagitis: K21.9

## 2023-07-29 HISTORY — DX: Anxiety disorder, unspecified: F41.9

## 2023-07-29 HISTORY — DX: Personal history of urinary calculi: Z87.442

## 2023-07-29 HISTORY — DX: Headache, unspecified: R51.9

## 2023-07-29 HISTORY — DX: Other complications of anesthesia, initial encounter: T88.59XA

## 2023-07-29 NOTE — Progress Notes (Signed)
 Case: [id number] Date/Time: 07/31/23 1000   Procedure: CYSTOSCOPY/URETEROSCOPY/HOLMIUM LASER/STENT PLACEMENT (Left)   Anesthesia type: General   Diagnosis: Left ureteral calculus [N20.1]   Pre-op diagnosis: LEFT URETERAL CALCULUS   Location: [facility] ROOM 01 / [facility]   Surgeons: Roxane Copp, MD       DISCUSSION: [name] is a 58 yo male who is being evaluated remotely prior to surgery above. 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 Per CRNA note on 07/11/23: Recommend Glidescope for all future intubations  Patient admitted from 07/11/23-07/15/23 due to urosepsis from obstructing stone. He underwent left ureteral stent placement on 07/11/23. No complications noted. He did have an AKI which resolved with treatment of sepsis and IVF. Now scheduled for definitive surgery above. Recheck of CBC and BMP recommended in 1 week due to AKI and anemia.  He followed up with his PCP on 07/24/23. BP noted to be 109/71 and all antihypertensives are currently on hold.   Pt with hx of DVT and is on Eliquis . LD Eliquis  07/28/23 at 2000  VS: There were no vitals taken for this visit.  PROVIDERS: Jonell Neptune, FNP   LABS: Will check CBC and BMP DOS   IMAGES: CXR 07/14/23:  FINDINGS: Stable heart size and mediastinal contours. No acute airspace disease. No pulmonary edema, pleural effusion or pneumothorax. Cervical and thoracic spinal fusion is partially included in the field of view.   IMPRESSION: No acute findings or radiographic evidence of aspiration.      Past Medical History:  Diagnosis Date   Anxiety    Complication of anesthesia    Difficult intubation   Depression    Diabetes mellitus without complication (HCC)     Dyspnea    GERD (gastroesophageal reflux disease)    Headache    History of cervical fracture    History of kidney stones    Hypertension    Paralysis (HCC)    C8; BLE    Past Surgical History:  Procedure Laterality Date   AMPUTATION Left 08/10/2021   Procedure: LEFT 5TH RAY AMPUTATION;  Surgeon: Timothy Ford, MD;  Location: [facility] OR;  Service: Orthopedics;  Laterality: Left;   CERVICAL SPINE SURGERY     CYSTOSCOPY W/ URETERAL STENT PLACEMENT Left 07/11/2023   Procedure: CYSTOSCOPY, WITH RETROGRADE PYELOGRAM AND URETERAL STENT INSERTION;  Surgeon: Roxane Copp, MD;  Location: [facility] OR;  Service: Urology;  Laterality: Left;   INCISION AND DRAINAGE OF WOUND N/A 07/06/2022   Procedure: IRRIGATION AND DEBRIDEMENT SACRAL WOUND;  Surgeon: Dorena Gander, MD;  Location: [facility] OR;  Service: General;  Laterality: N/A;    MEDICATIONS:  acetaminophen  (TYLENOL ) 500 MG tablet   apixaban  (ELIQUIS ) 5 MG TABS tablet   baclofen  (LIORESAL ) 20 MG tablet   BISACODYL  LAXATIVE RE   Carboxymethylcellulose Sodium (ARTIFICIAL TEARS OP)   docusate sodium  (COLACE) 100 MG capsule   FLUoxetine  (PROZAC ) 20 MG capsule   furosemide  (LASIX ) 40 MG tablet   gabapentin  (NEURONTIN ) 600 MG tablet   glipiZIDE  (GLUCOTROL ) 10 MG tablet   HYDROcodone -acetaminophen  (NORCO) 5-325 MG tablet   insulin  glargine (LANTUS ) 100 UNIT/ML injection   [Paused] lisinopril  (ZESTRIL ) 5 MG tablet   ondansetron  (ZOFRAN ) 4 MG tablet   sildenafil  (VIAGRA ) 100 MG tablet   sulfamethoxazole -trimethoprim  (BACTRIM  DS) 800-160  MG tablet   No current facility-administered medications for this encounter.    Antoinette Kirschner [facility] Short Stay/Anesthesiology [facility] Phone [phone] 07/30/2023 8:45 AM

## 2023-07-29 NOTE — Progress Notes (Signed)
   07/29/23 0906  OBSTRUCTIVE SLEEP APNEA  Have you ever been diagnosed with sleep apnea through a sleep study? No  Do you snore loudly (loud enough to be heard through closed doors)?  1  Do you often feel tired, fatigued, or sleepy during the daytime (such as falling asleep during driving or talking to someone)? 0  Has anyone observed you stop breathing during your sleep? 1  Do you have, or are you being treated for high blood pressure? 1  BMI more than 35 kg/m2? 0  Age > 50 (1-yes) 1  Neck circumference greater than:Male 16 inches or larger, Male 17inches or larger? 1  Male Gender (Yes=1) 1  Obstructive Sleep Apnea Score 6  Score 5 or greater  Results sent to PCP

## 2023-07-30 NOTE — Anesthesia Preprocedure Evaluation (Signed)
 Anesthesia Evaluation  Patient identified by MRN, date of birth, ID band Patient awake    Reviewed: Allergy & Precautions, NPO status , Patient's Chart, lab work & pertinent test results  Airway Mallampati: IV  TM Distance: >3 FB Neck ROM: Limited    Dental no notable dental hx.    Pulmonary neg pulmonary ROS   Pulmonary exam normal        Cardiovascular hypertension, Pt. on medications + DVT  Normal cardiovascular exam     Neuro/Psych  Headaches PSYCHIATRIC DISORDERS Anxiety Depression    Paralysis  Spinal cord injury at T1-T6 level   Neuromuscular disease    GI/Hepatic negative GI ROS, Neg liver ROS,,,  Endo/Other  diabetes, Oral Hypoglycemic Agents, Insulin  Dependent    Renal/GU Renal disease     Musculoskeletal negative musculoskeletal ROS (+)    Abdominal  (+) + obese  Peds  Hematology  (+) Blood dyscrasia (Eliquis ), anemia   Anesthesia Other Findings LEFT URETERAL CALCULUS  Reproductive/Obstetrics                             Anesthesia Physical Anesthesia Plan  ASA: 3  Anesthesia Plan: MAC   Post-op Pain Management:    Induction: Intravenous  PONV Risk Score and Plan: 1 and Ondansetron , Dexamethasone , Treatment may vary due to age or medical condition and Propofol  infusion  Airway Management Planned: Simple Face Mask  Additional Equipment:   Intra-op Plan:   Post-operative Plan:   Informed Consent: I have reviewed the patients History and Physical, chart, labs and discussed the procedure including the risks, benefits and alternatives for the proposed anesthesia with the patient or authorized representative who has indicated his/her understanding and acceptance.     Dental advisory given and Interpreter used for interview  Plan Discussed with: CRNA and Surgeon  Anesthesia Plan Comments: (PAT note from 6/17)        Anesthesia Quick Evaluation

## 2023-07-31 ENCOUNTER — Ambulatory Visit (HOSPITAL_COMMUNITY): Admitting: Physician Assistant

## 2023-07-31 ENCOUNTER — Ambulatory Visit (HOSPITAL_COMMUNITY)

## 2023-07-31 ENCOUNTER — Ambulatory Visit (HOSPITAL_COMMUNITY)
Admission: RE | Admit: 2023-07-31 | Discharge: 2023-07-31 | Disposition: A | Discharge status: Home / Self Care | Source: Ambulatory Visit | Attending: Urology | Admitting: Urology

## 2023-07-31 ENCOUNTER — Encounter (HOSPITAL_COMMUNITY)
Admission: RE | Disposition: A | Discharge status: Home / Self Care | Payer: Self-pay | Source: Ambulatory Visit | Attending: Urology

## 2023-07-31 ENCOUNTER — Encounter (HOSPITAL_COMMUNITY): Payer: Self-pay | Admitting: Urology

## 2023-07-31 ENCOUNTER — Ambulatory Visit (HOSPITAL_BASED_OUTPATIENT_CLINIC_OR_DEPARTMENT_OTHER): Admitting: Medical

## 2023-07-31 DIAGNOSIS — E119 Type 2 diabetes mellitus without complications: Secondary | ICD-10-CM | POA: Insufficient documentation

## 2023-07-31 DIAGNOSIS — G8250 Quadriplegia, unspecified: Secondary | ICD-10-CM | POA: Insufficient documentation

## 2023-07-31 DIAGNOSIS — Z7901 Long term (current) use of anticoagulants: Secondary | ICD-10-CM | POA: Diagnosis not present

## 2023-07-31 DIAGNOSIS — Z7984 Long term (current) use of oral hypoglycemic drugs: Secondary | ICD-10-CM | POA: Insufficient documentation

## 2023-07-31 DIAGNOSIS — Z87442 Personal history of urinary calculi: Secondary | ICD-10-CM | POA: Diagnosis not present

## 2023-07-31 DIAGNOSIS — N179 Acute kidney failure, unspecified: Secondary | ICD-10-CM

## 2023-07-31 DIAGNOSIS — N202 Calculus of kidney with calculus of ureter: Secondary | ICD-10-CM

## 2023-07-31 DIAGNOSIS — Z794 Long term (current) use of insulin: Secondary | ICD-10-CM | POA: Insufficient documentation

## 2023-07-31 DIAGNOSIS — Z86718 Personal history of other venous thrombosis and embolism: Secondary | ICD-10-CM | POA: Diagnosis not present

## 2023-07-31 DIAGNOSIS — I10 Essential (primary) hypertension: Secondary | ICD-10-CM | POA: Insufficient documentation

## 2023-07-31 DIAGNOSIS — F418 Other specified anxiety disorders: Secondary | ICD-10-CM

## 2023-07-31 DIAGNOSIS — Z87891 Personal history of nicotine dependence: Secondary | ICD-10-CM | POA: Insufficient documentation

## 2023-07-31 DIAGNOSIS — N319 Neuromuscular dysfunction of bladder, unspecified: Secondary | ICD-10-CM | POA: Insufficient documentation

## 2023-07-31 DIAGNOSIS — D638 Anemia in other chronic diseases classified elsewhere: Secondary | ICD-10-CM

## 2023-07-31 DIAGNOSIS — N201 Calculus of ureter: Secondary | ICD-10-CM | POA: Diagnosis present

## 2023-07-31 HISTORY — PX: CYSTOSCOPY/URETEROSCOPY/HOLMIUM LASER/STENT PLACEMENT: SHX6546

## 2023-07-31 LAB — GLUCOSE, CAPILLARY
Glucose-Capillary: 180 mg/dL — ABNORMAL HIGH (ref 70–99)
Glucose-Capillary: 158 mg/dL — ABNORMAL HIGH (ref 70–99)
Glucose-Capillary: 146 mg/dL — ABNORMAL HIGH (ref 70–99)

## 2023-07-31 LAB — BASIC METABOLIC PANEL WITH GFR
Anion gap: 4 — ABNORMAL LOW (ref 5–15)
BUN: 7 mg/dL (ref 6–20)
CO2: 26 mmol/L (ref 22–32)
Calcium: 8.7 mg/dL — ABNORMAL LOW (ref 8.9–10.3)
Chloride: 107 mmol/L (ref 98–111)
Creatinine, Ser: 0.46 mg/dL — ABNORMAL LOW (ref 0.61–1.24)
GFR, Estimated: >60 mL/min (ref >60)
Glucose, Bld: 179 mg/dL — ABNORMAL HIGH (ref 70–99)
Potassium: 3.5 mmol/L (ref 3.5–5.1)
Sodium: 137 mmol/L (ref 135–145)

## 2023-07-31 LAB — CBC
HCT: 33.7 % — ABNORMAL LOW (ref 39.0–52.0)
Hemoglobin: 10.3 g/dL — ABNORMAL LOW (ref 13.0–17.0)
MCH: 25.4 pg — ABNORMAL LOW (ref 26.0–34.0)
MCHC: 30.6 g/dL (ref 30.0–36.0)
MCV: 83 fL (ref 80.0–100.0)
Platelets: 323 10*3/uL (ref 150–400)
RBC: 4.06 MIL/uL — ABNORMAL LOW (ref 4.22–5.81)
RDW: 15.3 % (ref 11.5–15.5)
WBC: 7.1 10*3/uL (ref 4.0–10.5)
nRBC: 0 % (ref 0.0–0.2)

## 2023-07-31 SURGERY — CYSTOSCOPY/URETEROSCOPY/HOLMIUM LASER/STENT PLACEMENT
Anesthesia: General | Site: Bladder | Laterality: Left

## 2023-07-31 MED ORDER — OXYCODONE HCL 5 MG/5ML PO SOLN: 5.0000 mg | Freq: Once | ORAL | Status: AC | PRN

## 2023-07-31 MED ORDER — ACETAMINOPHEN 500 MG PO TABS
1000.0000 mg | ORAL_TABLET | Freq: Once | ORAL | Status: AC
  Administered 2023-07-31: 1000 mg via ORAL
  Filled 2023-07-31: qty 2

## 2023-07-31 MED ORDER — SODIUM CHLORIDE 0.9 % IV SOLN
2.0000 g | Freq: Once | INTRAVENOUS | Status: AC
  Filled 2023-07-31: qty 20

## 2023-07-31 MED ORDER — OXYCODONE HCL 5 MG PO TABS
ORAL_TABLET | ORAL | Status: AC
  Filled 2023-07-31: qty 1

## 2023-07-31 MED ORDER — OXYCODONE HCL 5 MG PO TABS
5.0000 mg | ORAL_TABLET | Freq: Once | ORAL | Status: AC | PRN
  Administered 2023-07-31: 5 mg via ORAL

## 2023-07-31 MED ORDER — LACTATED RINGERS IV SOLN: INTRAVENOUS | Status: DC

## 2023-07-31 MED ORDER — INSULIN ASPART 100 UNIT/ML IJ SOLN
0.0000 [IU] | INTRAMUSCULAR | Status: DC | PRN
  Administered 2023-07-31: 2 [IU] via SUBCUTANEOUS
  Filled 2023-07-31: qty 1

## 2023-07-31 MED ORDER — PROPOFOL 10 MG/ML IV BOLUS
INTRAVENOUS | Status: AC
  Filled 2023-07-31: qty 20

## 2023-07-31 MED ORDER — SODIUM CHLORIDE 0.9 % IR SOLN
Status: DC | PRN
  Administered 2023-07-31: 3000 mL via INTRAVESICAL

## 2023-07-31 MED ORDER — CHLORHEXIDINE GLUCONATE 0.12 % MT SOLN
15.0000 mL | Freq: Once | OROMUCOSAL | Status: AC
  Administered 2023-07-31: 15 mL via OROMUCOSAL

## 2023-07-31 MED ORDER — MIDAZOLAM HCL 2 MG/2ML IJ SOLN
INTRAMUSCULAR | Status: DC | PRN
  Administered 2023-07-31: 2 mg via INTRAVENOUS

## 2023-07-31 MED ORDER — LIDOCAINE HCL (PF) 2 % IJ SOLN
INTRAMUSCULAR | Status: AC
  Filled 2023-07-31: qty 5

## 2023-07-31 MED ORDER — FENTANYL CITRATE (PF) 250 MCG/5ML IJ SOLN
INTRAMUSCULAR | Status: AC
  Filled 2023-07-31: qty 5

## 2023-07-31 MED ORDER — ORAL CARE MOUTH RINSE: 15.0000 mL | Freq: Once | OROMUCOSAL | Status: AC

## 2023-07-31 MED ORDER — MIDAZOLAM HCL 2 MG/2ML IJ SOLN
INTRAMUSCULAR | Status: AC
  Filled 2023-07-31: qty 2

## 2023-07-31 MED ORDER — AMISULPRIDE (ANTIEMETIC) 5 MG/2ML IV SOLN: 10.0000 mg | Freq: Once | INTRAVENOUS | Status: DC | PRN

## 2023-07-31 MED ORDER — FENTANYL CITRATE PF 50 MCG/ML IJ SOSY: 25.0000 ug | PREFILLED_SYRINGE | INTRAMUSCULAR | Status: DC | PRN

## 2023-07-31 SURGICAL SUPPLY — 20 items
BAG URO CATCHER STRL LF (MISCELLANEOUS) ×1 IMPLANT
BASKET ZERO TIP NITINOL 2.4FR (BASKET) IMPLANT
CATH URETERAL DUAL LUMEN 10F (MISCELLANEOUS) IMPLANT
CATH URETL OPEN 5X70 (CATHETERS) ×1 IMPLANT
CLOTH BEACON ORANGE TIMEOUT ST (SAFETY) ×1 IMPLANT
DRSG TEGADERM 2-3/8X2-3/4 SM (GAUZE/BANDAGES/DRESSINGS) IMPLANT
FIBER LASER MOSES 200 DFL (Laser) IMPLANT
GLOVE BIO SURGEON STRL SZ 6.5 (GLOVE) ×1 IMPLANT
GOWN STRL REUS W/ TWL LRG LVL3 (GOWN DISPOSABLE) ×1 IMPLANT
GUIDEWIRE STR DUAL SENSOR (WIRE) ×1 IMPLANT
KIT TURNOVER KIT A (KITS) ×2 IMPLANT
MANIFOLD NEPTUNE II (INSTRUMENTS) ×1 IMPLANT
PACK CYSTO (CUSTOM PROCEDURE TRAY) ×1 IMPLANT
SHEATH NAVIGATOR HD 11/13X28 (SHEATH) IMPLANT
SHEATH NAVIGATOR HD 11/13X36 (SHEATH) IMPLANT
STENT URET 6FRX24 CONTOUR (STENTS) IMPLANT
TRACTIP FLEXIVA PULS ID 200XHI (Laser) IMPLANT
TRAY FOLEY MTR SLVR 16FR STAT (SET/KITS/TRAYS/PACK) IMPLANT
TUBING CONNECTING 10 (TUBING) ×1 IMPLANT
TUBING UROLOGY SET (TUBING) ×1 IMPLANT

## 2023-07-31 NOTE — Transfer of Care (Signed)
 Immediate Anesthesia Transfer of Care Note  Patient: [name]  Procedure(s) Performed: CYSTOSCOPY/URETEROSCOPY/HOLMIUM LASER/STENT PLACEMENT (Left: Bladder)  Patient Location: PACU  Anesthesia Type:MAC  Level of Consciousness: awake and patient cooperative  Airway & Oxygen Therapy: Patient Spontanous Breathing and Patient connected to face mask oxygen  Post-op Assessment: Report given to RN and Post -op Vital signs reviewed and stable  Post vital signs: Reviewed and stable  Last Vitals:  Vitals Value Taken Time  BP    Temp    Pulse 46 07/31/23 11:45  Resp 11 07/31/23 11:45  SpO2 100 % 07/31/23 11:45  Vitals shown include unfiled device data.  Last Pain:  Vitals:   07/31/23 0931  TempSrc:   PainSc: 0-No pain      Patients Stated Pain Goal: 4 (07/31/23 0931)  Complications: No notable events documented.

## 2023-07-31 NOTE — Interval H&P Note (Signed)
 History and Physical Interval Note: Patient underwent prior ureteral stent placement and has now been treated for infection. He returns for definitive removal of stone. Spanish interpreter was used.   07/31/2023 9:46 AM  [name]  has presented today for surgery, with the diagnosis of LEFT URETERAL CALCULUS.  The various methods of treatment have been discussed with the patient and family. After consideration of risks, benefits and other options for treatment, the patient has consented to  Procedure(s): CYSTOSCOPY/URETEROSCOPY/HOLMIUM LASER/STENT PLACEMENT (Left) as a surgical intervention.  The patient's history has been reviewed, patient examined, no change in status, stable for surgery.  I have reviewed the patient's chart and labs.  Questions were answered to the patient's satisfaction.     Kenlyn Lose D Zaynab Chipman

## 2023-07-31 NOTE — Op Note (Signed)
 Preoperative diagnosis: left ureteral and renal calculi  Postoperative diagnosis: left renal calculi  Procedure:  Cystoscopy left ureteroscopy, laser lithotripsy, basket stone extraction left 56F x 24cm ureteral stent exchange - with string    Surgeon: Perley Bradley, MD  Anesthesia: General  Complications: None  Intraoperative findings:  Normal urethra Bilateral lobe hypertrophy prostatic urethra Bilateral orthotropic ureteral orifices Bladder mucosa normal without masses   EBL: Minimal  Specimens: left renal calculus  Disposition of specimens: [facility] for stone analysis  Indication: [name] is a 58 y.o.   patient with a  4mm ureteral stone and who previously underwent left ureteral stent placement now returns for definitive stone.  After reviewing the management options for treatment, the patient elected to proceed with the above surgical procedure(s). We have discussed the potential benefits and risks of the procedure, side effects of the proposed treatment, the likelihood of the patient achieving the goals of the procedure, and any potential problems that might occur during the procedure or recuperation. Informed consent has been obtained.   Description of procedure:  The patient was taken to the operating room and general anesthesia was induced.  The patient was placed in the dorsal lithotomy position, prepped and draped in the usual sterile fashion, and preoperative antibiotics were administered. A preoperative time-out was performed.   Cystourethroscopy was performed.  The patient's urethra was examined and bilobar prostatic hypertrophy with a median lobe. The bladder was then systematically examined in its entirety. There was no evidence for any bladder tumors, stones, or other mucosal pathology.    Attention then turned to the left ureteral orifice and graspers used to bring the existing ureteral stent to the meatus.  A 0.3 sensor wire was  then advanced to the ureteral stent up to the kidney fluoroscopic guidance.  The stent was removed.  The 6 French semirigid ureteroscope was then advanced alongside the wire into the distal ureter.  The previously identified stone on CT was no longer present.  A second sensor wire was advanced through the ureteroscope and into the ureter with fluoroscopy.  The ureteroscope was removed.  Next, a ureteral access sheath was placed over one of the wire up to the kidney with fluoroscopic guidance. Flexible ureteroscopy took place and the larger calculi were fragmented with the 200 micron holmium laser fiber.  All stones were then removed from the ureter with a 0 tip basket.  Reinspection of the ureter revealed no remaining visible stones or fragments. The access sheath was removed in unison with ureteroscope on the way out.   The wire was then backloaded through the cystoscope and a ureteral stent was advance over the wire using Seldinger technique.  The stent was positioned appropriately under fluoroscopic and cystoscopic guidance.  The wire was then removed with an adequate stent curl noted in the renal pelvis as well as in the bladder.  The bladder was then emptied and the procedure ended.  The patient appeared to tolerate the procedure well and without complications.  The patient was able to be awakened and transferred to the recovery unit in satisfactory condition.   Disposition: The tether of the stent was left on and secured to the ventral aspect of the patient's penis. Instructions for removing the stent have been provided to the patient.

## 2023-07-31 NOTE — Discharge Instructions (Addendum)
 DISCHARGE INSTRUCTIONS FOR KIDNEY STONE/URETERAL STENT   MEDICATIONS:  1. Resume all your other meds from home    ACTIVITY:  1. No strenuous activity x 1week  2. No driving while on narcotic pain medications  3. Drink plenty of water  4. Continue to walk at home - you can still get blood clots when you are at home, so keep active, but don't over do it.  5. May return to work/school tomorrow or when you feel ready   BATHING:  1. You can shower and we recommend daily showers   SIGNS/SYMPTOMS TO CALL:  Please call us  if you have a fever greater than 101.5, uncontrolled nausea/vomiting, uncontrolled pain, dizziness, unable to urinate, bloody urine, chest pain, shortness of breath, leg swelling, leg pain, redness around wound, drainage from wound, or any other concerns or questions.   You can reach us  at [phone].   FOLLOW-UP:  1. You have a string attached to your stent and it is secured to the foley.  It will be removed when your foley is changed nxt.

## 2023-08-01 ENCOUNTER — Encounter (HOSPITAL_COMMUNITY): Payer: Self-pay | Admitting: Urology

## 2023-08-01 NOTE — Anesthesia Postprocedure Evaluation (Signed)
 Anesthesia Post Note  Patient: [name]  Procedure(s) Performed: CYSTOSCOPY/URETEROSCOPY/HOLMIUM LASER/STENT PLACEMENT (Left: Bladder)     Patient location during evaluation: PACU Anesthesia Type: MAC Level of consciousness: awake Pain management: pain level controlled Vital Signs Assessment: post-procedure vital signs reviewed and stable Respiratory status: spontaneous breathing, nonlabored ventilation and respiratory function stable Cardiovascular status: blood pressure returned to baseline and stable Postop Assessment: no apparent nausea or vomiting Anesthetic complications: no   No notable events documented.  Last Vitals:  Vitals:   07/31/23 1430 07/31/23 1500  BP: 108/60 108/62  Pulse: 74 74  Resp: 20 20  Temp: 36.6 C 36.6 C  SpO2: 97% 98%    Last Pain:  Vitals:   07/31/23 1500  TempSrc:   PainSc: 3                  Lamone Ferrelli P Wyn [place]

## 2023-08-26 ENCOUNTER — Encounter (HOSPITAL_BASED_OUTPATIENT_CLINIC_OR_DEPARTMENT_OTHER): Attending: Internal Medicine | Admitting: Internal Medicine

## 2023-08-26 DIAGNOSIS — M4628 Osteomyelitis of vertebra, sacral and sacrococcygeal region: Secondary | ICD-10-CM | POA: Diagnosis not present

## 2023-08-26 DIAGNOSIS — L89154 Pressure ulcer of sacral region, stage 4: Secondary | ICD-10-CM | POA: Insufficient documentation

## 2023-08-26 DIAGNOSIS — G8221 Paraplegia, complete: Secondary | ICD-10-CM | POA: Diagnosis not present

## 2023-08-26 DIAGNOSIS — E11622 Type 2 diabetes mellitus with other skin ulcer: Secondary | ICD-10-CM | POA: Diagnosis not present

## 2023-09-02 ENCOUNTER — Other Ambulatory Visit (HOSPITAL_COMMUNITY): Payer: Self-pay | Admitting: Nurse Practitioner

## 2023-09-02 DIAGNOSIS — N201 Calculus of ureter: Secondary | ICD-10-CM

## 2023-09-15 ENCOUNTER — Ambulatory Visit (HOSPITAL_COMMUNITY): Admission: RE | Admit: 2023-09-15 | Source: Ambulatory Visit

## 2023-09-19 ENCOUNTER — Ambulatory Visit (HOSPITAL_COMMUNITY)
Admission: RE | Admit: 2023-09-19 | Discharge: 2023-09-19 | Disposition: A | Discharge status: Home / Self Care | Source: Ambulatory Visit | Attending: Nurse Practitioner | Admitting: Nurse Practitioner

## 2023-09-19 DIAGNOSIS — N201 Calculus of ureter: Secondary | ICD-10-CM | POA: Diagnosis present

## 2023-10-10 ENCOUNTER — Encounter: Payer: Self-pay | Admitting: Physical Medicine and Rehabilitation

## 2023-10-10 ENCOUNTER — Encounter: Attending: Physical Medicine and Rehabilitation | Admitting: Physical Medicine and Rehabilitation

## 2023-10-10 VITALS — BP 113/77 | HR 56 | Ht 65.0 in

## 2023-10-10 DIAGNOSIS — Z993 Dependence on wheelchair: Secondary | ICD-10-CM | POA: Diagnosis present

## 2023-10-10 DIAGNOSIS — G8250 Quadriplegia, unspecified: Secondary | ICD-10-CM | POA: Diagnosis not present

## 2023-10-10 DIAGNOSIS — R252 Cramp and spasm: Secondary | ICD-10-CM | POA: Insufficient documentation

## 2023-10-10 DIAGNOSIS — M7918 Myalgia, other site: Secondary | ICD-10-CM | POA: Diagnosis present

## 2023-10-10 DIAGNOSIS — G8921 Chronic pain due to trauma: Secondary | ICD-10-CM | POA: Diagnosis not present

## 2023-10-10 DIAGNOSIS — M4628 Osteomyelitis of vertebra, sacral and sacrococcygeal region: Secondary | ICD-10-CM | POA: Diagnosis not present

## 2023-10-10 MED ORDER — SILDENAFIL CITRATE 100 MG PO TABS: 100.0000 mg | ORAL_TABLET | ORAL | 5 refills | Status: AC | PRN

## 2023-10-10 MED ORDER — LIDOCAINE HCL 1 % IJ SOLN
9.0000 mL | Freq: Once | INTRAMUSCULAR | Status: AC
  Administered 2023-10-10: 9 mL

## 2023-10-10 MED ORDER — BACLOFEN 20 MG PO TABS: 30.0000 mg | ORAL_TABLET | Freq: Three times a day (TID) | ORAL | 1 refills | Status: AC

## 2023-10-10 MED ORDER — GABAPENTIN 600 MG PO TABS: 600.0000 mg | ORAL_TABLET | Freq: Three times a day (TID) | ORAL | 3 refills | Status: AC

## 2023-10-10 MED ORDER — FLUOXETINE HCL 20 MG PO CAPS: 20.0000 mg | ORAL_CAPSULE | Freq: Every day | ORAL | 3 refills | Status: AC

## 2023-10-10 NOTE — Progress Notes (Signed)
 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            Had kidney stones- was blasted? Or something to destroy the stones and then foley placed-  has a foley-  has to wear foley until sore on bottom is healed.     Was told has to come to appointments in ambulance/stretcher and NO SITTING by Dr Brigitte.   Sent to Dr Lang, but saw Dr Brigitte-  Was supposed to do surgery-   Isn't a candidate- because wound isn't healthy enough- and spasticity needs better control  Just changed appt because just had UTI and told that makes him not a candidate for surgery for that reason as a result as well .   Wound looks smaller now as well - per wife- showed me a picture.  Compared to  May 26th- about half the size.  Sand bed was too hot- so wasn't in it, because too hot.   In bed most of the day now- some days 24 hours/day- - also told him to turn q2 hours- also turns q3-4 hours- but likes ot lay on back-  On side more lately as well-   When has help from wife to turn, gets off wound completely-  To prop him, uses a wedge given at hospital-  props the length of his body- it sounds like. Also uses special pillow between knees as well on side  Increased Baclofen -   using 20 mg TID now-  When has long periods of inactivity , is better- and spasms not as long/as bad-  When tries to move- before was occurring al the time.    They took his lift to transfer him!   Exam: Sitting up in chair- trying to rotate from side to side, but doing true pressure relief. , NAD MAS of 1+-2 in LE's B/L 4=5 beats clonus B/L in LE's with ROM   Plan: When does have surgery, will need Valium  after surgery 5 mg 3x/day to keep spasms under complete control.   2. Can try to increase Baclofen -  30 mg 3x/day- for spasticity -   To try and get spasticity/spasmos to almost nothing.   3. When does pressure relief in the wheelchair- needs to lean ALL the way back-  100% back- will not fall over.   4. Patient here for trigger point injections for  Consent done and on chart.  Cleaned areas with alcohol  and injected using a 27 gauge 1.5 inch needle  Injected 6cc- none wasted Using 1% Lidocaine  with no EPI  Upper traps B/L  Levators- B/L  Posterior scalenes Middle scalenes- B/L  Splenius Capitus- B/L  Pectoralis Major- B/L  Rhomboids- B/L x3 Infraspinatus Teres Major/minor Thoracic paraspinals- B/L Lumbar paraspinals Other injections-  B/L deltoids x2    There was no bleeding or complications.  Patient was advised to drink a lot of water on day after injections to flush system Will have increased soreness for 12-48 hours after injections.  Can use Lidocaine  patches the day AFTER injections Can use theracane on day of injections in places didn't inject Can use heating pad 4-6 hours AFTER injections  5.  Will refill Prozac  20 mg daily for nerve pain  6. Will refill Gabapentin  600 mg 3x/day- - sent in   7. Reflil Viagra , but cannot increase dose- DO NOT increase dose- it will drop your Blood pressure TOO LOW!  8. Needs to Call Dr Arvis and order Trapeze- have to ask Dr Arvis to get back!  9. Also ask Dr Arvis to get the [facility] lift back!!!- I cannot order because I didn't order- the old hoyer lift- isn't working- the wheels are broken.   10. I cannot order before 5 years-  not the w/c- have ot wait til Dec/January to order new w/c.  I need another insurance company to get him to use another company for wheelchair- I can use [employer] if has [place].     11.  F/U - SCI and trp Injections- in 3months double appt  12.  I cannot get W/c and stander form insurance, but I  think it's a great idea- will treat spasticity, will help your bowels/better and faster, and and weight off your butt!  I spent a total of   46  minutes on total care today- >50% coordination of care- due to 6 minutes on injections- and rest d/w pt about stage IV pressure ulcer, w/c, stander; as well as refill of meds- as well as spasticity- increase meds.

## 2023-10-10 NOTE — Patient Instructions (Addendum)
 Plan: When does have surgery, will need Valium after surgery 5 mg 3x/day to keep spasms under complete control.   2. Can try to increase Baclofen -  30 mg 3x/day- for spasticity -   To try and get spasticity/spasmos to almost nothing.   3. When does pressure relief in the wheelchair- needs to lean ALL the way back-  100% back- will not fall over.   4. Patient here for trigger point injections for  Consent done and on chart.  Cleaned areas with alcohol  and injected using a 27 gauge 1.5 inch needle  Injected 6cc- none wasted Using 1% Lidocaine  with no EPI  Upper traps B/L  Levators- B/L  Posterior scalenes Middle scalenes- B/L  Splenius Capitus- B/L  Pectoralis Major- B/L  Rhomboids- B/L x3 Infraspinatus Teres Major/minor Thoracic paraspinals- B/L Lumbar paraspinals Other injections-  B/L deltoids x2    There was no bleeding or complications.  Patient was advised to drink a lot of water on day after injections to flush system Will have increased soreness for 12-48 hours after injections.  Can use Lidocaine  patches the day AFTER injections Can use theracane on day of injections in places didn't inject Can use heating pad 4-6 hours AFTER injections  5.  Will refill Prozac  20 mg daily for nerve pain  6. Will refill Gabapentin  600 mg 3x/day- - sent in   7. Reflil Viagra , but cannot increase dose- DO NOT increase dose- it will drop your Blood pressure TOO LOW!  8. Needs to Call Dr Arvis and order Trapeze- have to ask Dr Arvis to get back!  9. Also ask Dr Arvis to get the [facility] lift back!!!- I cannot order because I didn't order- the old hoyer lift- isn't working- the wheels are broken.   10. I cannot order before 5 years-  not the w/c- have ot wait til Dec/January to order new w/c.  I need another insurance company to get him to use another company for wheelchair- I can use [employer] if has [place].     11.  F/U - SCI and trp Injections- in 3months double appt  12.  I  cannot get W/c and stander form insurance, but I think it's a great idea- will treat spasticity, will help your bowels/better and faster, and and weight off your butt!

## 2023-10-21 ENCOUNTER — Encounter (HOSPITAL_BASED_OUTPATIENT_CLINIC_OR_DEPARTMENT_OTHER): Attending: Internal Medicine | Admitting: Internal Medicine

## 2023-10-21 DIAGNOSIS — G8221 Paraplegia, complete: Secondary | ICD-10-CM | POA: Insufficient documentation

## 2023-10-21 DIAGNOSIS — M4628 Osteomyelitis of vertebra, sacral and sacrococcygeal region: Secondary | ICD-10-CM | POA: Diagnosis not present

## 2023-10-21 DIAGNOSIS — E11622 Type 2 diabetes mellitus with other skin ulcer: Secondary | ICD-10-CM | POA: Insufficient documentation

## 2023-10-21 DIAGNOSIS — L89154 Pressure ulcer of sacral region, stage 4: Secondary | ICD-10-CM | POA: Diagnosis not present

## 2023-12-16 ENCOUNTER — Encounter (HOSPITAL_BASED_OUTPATIENT_CLINIC_OR_DEPARTMENT_OTHER): Attending: Internal Medicine | Admitting: Internal Medicine

## 2023-12-16 DIAGNOSIS — M4628 Osteomyelitis of vertebra, sacral and sacrococcygeal region: Secondary | ICD-10-CM | POA: Diagnosis not present

## 2023-12-16 DIAGNOSIS — E11622 Type 2 diabetes mellitus with other skin ulcer: Secondary | ICD-10-CM | POA: Diagnosis not present

## 2023-12-16 DIAGNOSIS — L89154 Pressure ulcer of sacral region, stage 4: Secondary | ICD-10-CM

## 2023-12-16 DIAGNOSIS — B379 Candidiasis, unspecified: Secondary | ICD-10-CM

## 2024-01-07 ENCOUNTER — Encounter: Payer: Self-pay | Admitting: Physical Medicine and Rehabilitation

## 2024-01-07 ENCOUNTER — Encounter: Attending: Physical Medicine and Rehabilitation | Admitting: Physical Medicine and Rehabilitation

## 2024-01-07 VITALS — BP 138/81 | HR 61 | Ht 65.0 in

## 2024-01-07 DIAGNOSIS — G8250 Quadriplegia, unspecified: Secondary | ICD-10-CM | POA: Diagnosis not present

## 2024-01-07 DIAGNOSIS — R252 Cramp and spasm: Secondary | ICD-10-CM | POA: Insufficient documentation

## 2024-01-07 DIAGNOSIS — Z993 Dependence on wheelchair: Secondary | ICD-10-CM | POA: Insufficient documentation

## 2024-01-07 DIAGNOSIS — L89154 Pressure ulcer of sacral region, stage 4: Secondary | ICD-10-CM | POA: Diagnosis present

## 2024-01-07 MED ORDER — LIDOCAINE HCL 1 % IJ SOLN
6.0000 mL | Freq: Once | INTRAMUSCULAR | Status: AC
  Administered 2024-01-07: 6 mL

## 2024-01-07 NOTE — Progress Notes (Signed)
 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               Was supposed to be off wound on sacrum all the time per last note from Dr Brigitte.   Majority of time he's in bed- and when went to last appt with Dr Deette vs Arvis, doing much better!  Dr Lang vs Arvis, might not need surgery. Because much smaller.   Got trapeze! [place]-  H/H aide- just started, but doing well.    Lifting with- lift-  they got pt a new one! Doesn't know if renting it or owns it.   Spasticity-  Went up on Baclofen  to 30 mg TID  Legs are more limber per Beulah when does ROM.   Comes 3x/week-  is real limber, still the spasms when moves him.  When not enough movement, gets tight.  Doing better on updated baclofen  dose.      Plan:  Patient here for trigger point injections for  Consent done and on chart.  Cleaned areas with alcohol  and injected using a 27 gauge 1.5 inch needle  Injected 6cc none wasted Using 1% Lidocaine  with no EPI  Upper traps B/L  Levators B/L  Posterior scalenes Middle scalenes B/L x2 Splenius Capitus B/L  Pectoralis Major B/L  Rhomboids B/L x2 Infraspinatus Teres Major/minor Thoracic paraspinals Lumbar paraspinals Other injections- B/L deltoids- Twitch responses in many muscles   Patient's level of pain prior was Current level of pain after injections is  There was no bleeding or complications.  Patient was advised to drink a lot of water on day after injections to flush system Will have increased soreness for 12-48 hours after injections.  Can use Lidocaine  patches the day AFTER injections Can use theracane on day of  injections in places didn't inject Can use heating pad 4-6 hours AFTER injections   2.  Doesn't need refills of meds today- gabapentin , Prozac , Viagra ,    3. Needs a refill of Eliquis , but needs to get from PCP.    4. 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   5.   Will con't baclofen  30 mg 3x/day-  for spasticity- is doing better, so won't change the dose.   6. F/U in 3 months- double appt SCI and trp Injections.    7. Cannot write for pain meds in future, since had a cocaine (+) oral drug screen. D/w pt and wife with interpretor   8.   Also d/w pt that is non opiates coverage only.   9. Will do w/c at next appt- is due then.   I spent a total of  35   minutes on total care today- >50% coordination of care- due to 5 min on injections- rest d/w pt about w/c; nonopiate coverage only- spasticity, stage IV pressure ulcer.

## 2024-01-07 NOTE — Addendum Note (Signed)
 Addended by: Kadasia Kassing W on: 01/07/2024 12:57 PM   Modules accepted: Orders

## 2024-01-07 NOTE — Patient Instructions (Addendum)
 Plan:  Patient here for trigger point injections for  Consent done and on chart.  Cleaned areas with alcohol  and injected using a 27 gauge 1.5 inch needle  Injected 6cc none wasted Using 1% Lidocaine  with no EPI  Upper traps B/L  Levators B/L  Posterior scalenes Middle scalenes B/L x2 Splenius Capitus B/L  Pectoralis Major B/L  Rhomboids B/L x2 Infraspinatus Teres Major/minor Thoracic paraspinals Lumbar paraspinals Other injections- B/L deltoids- Twitch responses in many muscles   Patient's level of pain prior was Current level of pain after injections is  There was no bleeding or complications.  Patient was advised to drink a lot of water on day after injections to flush system Will have increased soreness for 12-48 hours after injections.  Can use Lidocaine  patches the day AFTER injections Can use theracane on day of injections in places didn't inject Can use heating pad 4-6 hours AFTER injections   2.  Doesn't need refills of meds today- gabapentin , Prozac , Viagra ,    3. Needs a refill of Eliquis , but needs to get from PCP.    4. 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   5.   Will con't baclofen  30 mg 3x/day-  for spasticity- is doing better, so won't change the dose.   6. F/U in 3 months- double appt SCI and trp Injections.    7. Cannot write for pain meds in future, since had a cocaine (+) oral drug screen. D/w pt and wife with interpretor   8.   Also d/w pt that is non opiates coverage only.   9. Will do w/c at next appt- is due then.

## 2024-02-25 ENCOUNTER — Encounter (HOSPITAL_BASED_OUTPATIENT_CLINIC_OR_DEPARTMENT_OTHER): Attending: Internal Medicine | Admitting: Internal Medicine

## 2024-02-25 DIAGNOSIS — B379 Candidiasis, unspecified: Secondary | ICD-10-CM | POA: Insufficient documentation

## 2024-02-25 DIAGNOSIS — M4628 Osteomyelitis of vertebra, sacral and sacrococcygeal region: Secondary | ICD-10-CM | POA: Insufficient documentation

## 2024-02-25 DIAGNOSIS — L89154 Pressure ulcer of sacral region, stage 4: Secondary | ICD-10-CM | POA: Diagnosis not present

## 2024-02-25 DIAGNOSIS — E11622 Type 2 diabetes mellitus with other skin ulcer: Secondary | ICD-10-CM | POA: Diagnosis not present

## 2024-02-25 DIAGNOSIS — G8221 Paraplegia, complete: Secondary | ICD-10-CM | POA: Insufficient documentation

## 2024-03-15 ENCOUNTER — Ambulatory Visit: Admitting: Podiatry

## 2024-03-22 ENCOUNTER — Ambulatory Visit: Admitting: Podiatry

## 2024-04-14 ENCOUNTER — Encounter: Admitting: Physical Medicine and Rehabilitation

## 2024-04-20 ENCOUNTER — Encounter (HOSPITAL_BASED_OUTPATIENT_CLINIC_OR_DEPARTMENT_OTHER): Admitting: Internal Medicine
